# Patient Record
Sex: Female | Born: 1945
Health system: Southern US, Community
[De-identification: ages and names within clinical notes are randomized; demographics above are authoritative.]

## PROBLEM LIST (undated history)

## (undated) DIAGNOSIS — M797 Fibromyalgia: Secondary | ICD-10-CM

## (undated) DIAGNOSIS — Z8701 Personal history of pneumonia (recurrent): Secondary | ICD-10-CM

## (undated) DIAGNOSIS — Z9689 Presence of other specified functional implants: Secondary | ICD-10-CM

## (undated) DIAGNOSIS — M810 Age-related osteoporosis without current pathological fracture: Secondary | ICD-10-CM

## (undated) DIAGNOSIS — F419 Anxiety disorder, unspecified: Secondary | ICD-10-CM

## (undated) DIAGNOSIS — R0609 Other forms of dyspnea: Secondary | ICD-10-CM

## (undated) DIAGNOSIS — Z9181 History of falling: Secondary | ICD-10-CM

## (undated) DIAGNOSIS — G250 Essential tremor: Secondary | ICD-10-CM

## (undated) DIAGNOSIS — S471XXA Crushing injury of right shoulder and upper arm, initial encounter: Secondary | ICD-10-CM

## (undated) DIAGNOSIS — M4120 Other idiopathic scoliosis, site unspecified: Secondary | ICD-10-CM

## (undated) DIAGNOSIS — Z8619 Personal history of other infectious and parasitic diseases: Secondary | ICD-10-CM

## (undated) DIAGNOSIS — E785 Hyperlipidemia, unspecified: Secondary | ICD-10-CM

## (undated) DIAGNOSIS — Z8659 Personal history of other mental and behavioral disorders: Secondary | ICD-10-CM

## (undated) DIAGNOSIS — M1990 Unspecified osteoarthritis, unspecified site: Secondary | ICD-10-CM

## (undated) DIAGNOSIS — D518 Other vitamin B12 deficiency anemias: Secondary | ICD-10-CM

## (undated) DIAGNOSIS — Z973 Presence of spectacles and contact lenses: Secondary | ICD-10-CM

## (undated) DIAGNOSIS — N201 Calculus of ureter: Secondary | ICD-10-CM

## (undated) DIAGNOSIS — Z9889 Other specified postprocedural states: Secondary | ICD-10-CM

## (undated) DIAGNOSIS — F988 Other specified behavioral and emotional disorders with onset usually occurring in childhood and adolescence: Secondary | ICD-10-CM

## (undated) DIAGNOSIS — Z972 Presence of dental prosthetic device (complete) (partial): Secondary | ICD-10-CM

## (undated) DIAGNOSIS — R918 Other nonspecific abnormal finding of lung field: Secondary | ICD-10-CM

## (undated) DIAGNOSIS — I10 Essential (primary) hypertension: Secondary | ICD-10-CM

## (undated) DIAGNOSIS — G43909 Migraine, unspecified, not intractable, without status migrainosus: Secondary | ICD-10-CM

## (undated) DIAGNOSIS — S6732XA Crushing injury of left wrist, initial encounter: Secondary | ICD-10-CM

## (undated) DIAGNOSIS — S90811A Abrasion, right foot, initial encounter: Secondary | ICD-10-CM

## (undated) DIAGNOSIS — D649 Anemia, unspecified: Secondary | ICD-10-CM

## (undated) DIAGNOSIS — N289 Disorder of kidney and ureter, unspecified: Secondary | ICD-10-CM

## (undated) DIAGNOSIS — G8929 Other chronic pain: Secondary | ICD-10-CM

## (undated) DIAGNOSIS — R112 Nausea with vomiting, unspecified: Secondary | ICD-10-CM

## (undated) DIAGNOSIS — R0600 Dyspnea, unspecified: Secondary | ICD-10-CM

## (undated) DIAGNOSIS — M549 Dorsalgia, unspecified: Secondary | ICD-10-CM

## (undated) DIAGNOSIS — Z9884 Bariatric surgery status: Secondary | ICD-10-CM

## (undated) DIAGNOSIS — Z87898 Personal history of other specified conditions: Secondary | ICD-10-CM

## (undated) HISTORY — PX: CARDIAC CATHETERIZATION: SHX172

## (undated) HISTORY — PX: TUBAL LIGATION: SHX77

## (undated) HISTORY — DX: Other vitamin B12 deficiency anemias: D51.8

## (undated) HISTORY — DX: Other specified behavioral and emotional disorders with onset usually occurring in childhood and adolescence: F98.8

## (undated) HISTORY — DX: Other idiopathic scoliosis, site unspecified: M41.20

## (undated) HISTORY — PX: ROUX-EN-Y GASTRIC BYPASS: SHX1104

## (undated) HISTORY — PX: KNEE ARTHROSCOPY: SHX127

## (undated) HISTORY — DX: Anxiety disorder, unspecified: F41.9

## (undated) HISTORY — PX: ABDOMINAL HYSTERECTOMY: SHX81

## (undated) HISTORY — PX: OTHER SURGICAL HISTORY: SHX169

---
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
## 1999-11-09 HISTORY — PX: REDUCTION MAMMAPLASTY: SUR839

## 2000-11-08 DIAGNOSIS — Z87898 Personal history of other specified conditions: Secondary | ICD-10-CM

## 2000-11-08 HISTORY — DX: Personal history of other specified conditions: Z87.898

## 2001-03-31 ENCOUNTER — Ambulatory Visit (HOSPITAL_COMMUNITY): Admission: RE | Admit: 2001-03-31 | Discharge: 2001-03-31 | Payer: Self-pay | Admitting: Orthopedic Surgery

## 2002-12-12 ENCOUNTER — Inpatient Hospital Stay (HOSPITAL_COMMUNITY): Admission: EM | Admit: 2002-12-12 | Discharge: 2002-12-13 | Payer: Self-pay | Admitting: Emergency Medicine

## 2002-12-12 ENCOUNTER — Encounter: Payer: Self-pay | Admitting: Emergency Medicine

## 2003-02-18 ENCOUNTER — Encounter: Admission: RE | Admit: 2003-02-18 | Discharge: 2003-02-18 | Payer: Self-pay | Admitting: Family Medicine

## 2003-02-18 ENCOUNTER — Encounter: Payer: Self-pay | Admitting: Family Medicine

## 2003-06-12 ENCOUNTER — Encounter: Admission: RE | Admit: 2003-06-12 | Discharge: 2003-09-10 | Payer: Self-pay | Admitting: Surgery

## 2003-06-17 ENCOUNTER — Encounter: Payer: Self-pay | Admitting: Surgery

## 2003-06-17 ENCOUNTER — Encounter: Admission: RE | Admit: 2003-06-17 | Discharge: 2003-06-17 | Payer: Self-pay | Admitting: Surgery

## 2003-09-16 ENCOUNTER — Inpatient Hospital Stay (HOSPITAL_COMMUNITY): Admission: RE | Admit: 2003-09-16 | Discharge: 2003-09-18 | Payer: Self-pay | Admitting: Surgery

## 2003-09-16 DIAGNOSIS — Z9884 Bariatric surgery status: Secondary | ICD-10-CM

## 2003-09-16 HISTORY — DX: Bariatric surgery status: Z98.84

## 2003-10-08 ENCOUNTER — Encounter: Admission: RE | Admit: 2003-10-08 | Discharge: 2004-01-06 | Payer: Self-pay | Admitting: Surgery

## 2004-07-31 ENCOUNTER — Encounter: Admission: RE | Admit: 2004-07-31 | Discharge: 2004-07-31 | Payer: Self-pay | Admitting: Family Medicine

## 2004-08-03 ENCOUNTER — Encounter: Admission: RE | Admit: 2004-08-03 | Discharge: 2004-08-03 | Payer: Self-pay | Admitting: Family Medicine

## 2004-12-07 ENCOUNTER — Ambulatory Visit: Payer: Self-pay | Admitting: Family Medicine

## 2005-02-17 ENCOUNTER — Ambulatory Visit: Payer: Self-pay | Admitting: Family Medicine

## 2005-02-27 ENCOUNTER — Encounter: Admission: RE | Admit: 2005-02-27 | Discharge: 2005-02-27 | Payer: Self-pay | Admitting: Family Medicine

## 2005-03-16 ENCOUNTER — Ambulatory Visit: Payer: Self-pay | Admitting: Family Medicine

## 2005-04-23 ENCOUNTER — Ambulatory Visit: Payer: Self-pay | Admitting: Family Medicine

## 2005-12-29 ENCOUNTER — Encounter: Admission: RE | Admit: 2005-12-29 | Discharge: 2005-12-29 | Payer: Self-pay | Admitting: Family Medicine

## 2006-07-22 ENCOUNTER — Ambulatory Visit: Payer: Self-pay | Admitting: Family Medicine

## 2006-09-02 ENCOUNTER — Encounter: Admission: RE | Admit: 2006-09-02 | Discharge: 2006-09-02 | Payer: Self-pay | Admitting: Family Medicine

## 2007-05-08 ENCOUNTER — Ambulatory Visit: Payer: Self-pay | Admitting: Family Medicine

## 2007-05-15 ENCOUNTER — Other Ambulatory Visit: Admission: RE | Admit: 2007-05-15 | Discharge: 2007-05-15 | Payer: Self-pay | Admitting: Family Medicine

## 2007-05-15 ENCOUNTER — Encounter: Payer: Self-pay | Admitting: Family Medicine

## 2007-05-15 ENCOUNTER — Ambulatory Visit: Payer: Self-pay | Admitting: Family Medicine

## 2007-05-15 DIAGNOSIS — R1013 Epigastric pain: Secondary | ICD-10-CM

## 2007-05-15 DIAGNOSIS — E785 Hyperlipidemia, unspecified: Secondary | ICD-10-CM | POA: Insufficient documentation

## 2007-05-15 DIAGNOSIS — IMO0001 Reserved for inherently not codable concepts without codable children: Secondary | ICD-10-CM | POA: Insufficient documentation

## 2007-05-15 DIAGNOSIS — R51 Headache: Secondary | ICD-10-CM

## 2007-05-15 DIAGNOSIS — R0789 Other chest pain: Secondary | ICD-10-CM

## 2007-05-15 DIAGNOSIS — E8941 Symptomatic postprocedural ovarian failure: Secondary | ICD-10-CM

## 2007-05-15 DIAGNOSIS — R519 Headache, unspecified: Secondary | ICD-10-CM | POA: Insufficient documentation

## 2007-05-15 DIAGNOSIS — Z9884 Bariatric surgery status: Secondary | ICD-10-CM

## 2007-05-15 DIAGNOSIS — Z9189 Other specified personal risk factors, not elsewhere classified: Secondary | ICD-10-CM | POA: Insufficient documentation

## 2007-05-15 LAB — CONVERTED CEMR LAB
ALT: 12 units/L (ref 0–35)
AST: 29 units/L (ref 0–37)
Albumin: 3.7 g/dL (ref 3.5–5.2)
Alkaline Phosphatase: 83 units/L (ref 39–117)
BUN: 13 mg/dL (ref 6–23)
Basophils Absolute: 0.1 10*3/uL (ref 0.0–0.1)
Basophils Relative: 0.9 % (ref 0.0–1.0)
Bilirubin, Direct: 0.1 mg/dL (ref 0.0–0.3)
CO2: 25 meq/L (ref 19–32)
Calcium: 8.4 mg/dL (ref 8.4–10.5)
Chloride: 114 meq/L — ABNORMAL HIGH (ref 96–112)
Cholesterol: 212 mg/dL (ref 0–200)
Creatinine, Ser: 0.6 mg/dL (ref 0.4–1.2)
Direct LDL: 143.3 mg/dL
Eosinophils Absolute: 0.4 10*3/uL (ref 0.0–0.6)
Eosinophils Relative: 7.5 % — ABNORMAL HIGH (ref 0.0–5.0)
GFR calc Af Amer: 132 mL/min
GFR calc non Af Amer: 109 mL/min
Glucose, Bld: 69 mg/dL — ABNORMAL LOW (ref 70–99)
HCT: 37.2 % (ref 36.0–46.0)
HDL: 64.4 mg/dL (ref >39.0)
Hemoglobin: 12.1 g/dL (ref 12.0–15.0)
Lymphocytes Relative: 36.6 % (ref 12.0–46.0)
MCHC: 32.5 g/dL (ref 30.0–36.0)
MCV: 85.5 fL (ref 78.0–100.0)
Monocytes Absolute: 0.6 10*3/uL (ref 0.2–0.7)
Monocytes Relative: 10 % (ref 3.0–11.0)
Neutro Abs: 2.5 10*3/uL (ref 1.4–7.7)
Neutrophils Relative %: 45 % (ref 43.0–77.0)
Platelets: 350 10*3/uL (ref 150–400)
Potassium: 4 meq/L (ref 3.5–5.1)
RBC: 4.35 M/uL (ref 3.87–5.11)
RDW: 14 % (ref 11.5–14.6)
Sodium: 143 meq/L (ref 135–145)
TSH: 2.38 microintl units/mL (ref 0.35–5.50)
Total Bilirubin: 0.6 mg/dL (ref 0.3–1.2)
Total CHOL/HDL Ratio: 3.3
Total Protein: 6.7 g/dL (ref 6.0–8.3)
Triglycerides: 117 mg/dL (ref 0–149)
VLDL: 23 mg/dL (ref 0–40)
WBC: 5.7 10*3/uL (ref 4.5–10.5)

## 2007-05-16 ENCOUNTER — Encounter: Payer: Self-pay | Admitting: Family Medicine

## 2007-05-18 ENCOUNTER — Encounter (INDEPENDENT_AMBULATORY_CARE_PROVIDER_SITE_OTHER): Payer: Self-pay | Admitting: *Deleted

## 2007-05-18 ENCOUNTER — Telehealth (INDEPENDENT_AMBULATORY_CARE_PROVIDER_SITE_OTHER): Payer: Self-pay | Admitting: *Deleted

## 2007-05-22 LAB — CONVERTED CEMR LAB: Anti Nuclear Antibody(ANA): NEGATIVE

## 2007-05-29 ENCOUNTER — Ambulatory Visit: Payer: Self-pay

## 2007-05-29 ENCOUNTER — Encounter: Payer: Self-pay | Admitting: Family Medicine

## 2007-06-01 ENCOUNTER — Encounter: Payer: Self-pay | Admitting: Family Medicine

## 2007-06-06 ENCOUNTER — Ambulatory Visit: Payer: Self-pay

## 2007-06-08 ENCOUNTER — Encounter: Payer: Self-pay | Admitting: Family Medicine

## 2007-06-08 ENCOUNTER — Encounter: Admission: RE | Admit: 2007-06-08 | Discharge: 2007-06-08 | Payer: Self-pay | Admitting: Family Medicine

## 2007-06-09 ENCOUNTER — Encounter (INDEPENDENT_AMBULATORY_CARE_PROVIDER_SITE_OTHER): Payer: Self-pay | Admitting: *Deleted

## 2007-06-09 ENCOUNTER — Encounter: Payer: Self-pay | Admitting: Family Medicine

## 2007-06-12 ENCOUNTER — Telehealth (INDEPENDENT_AMBULATORY_CARE_PROVIDER_SITE_OTHER): Payer: Self-pay | Admitting: *Deleted

## 2007-06-13 ENCOUNTER — Telehealth (INDEPENDENT_AMBULATORY_CARE_PROVIDER_SITE_OTHER): Payer: Self-pay | Admitting: *Deleted

## 2007-06-13 ENCOUNTER — Encounter (INDEPENDENT_AMBULATORY_CARE_PROVIDER_SITE_OTHER): Payer: Self-pay | Admitting: *Deleted

## 2007-07-06 ENCOUNTER — Ambulatory Visit: Payer: Self-pay | Admitting: Cardiology

## 2007-07-17 ENCOUNTER — Ambulatory Visit: Payer: Self-pay

## 2007-07-17 ENCOUNTER — Encounter: Payer: Self-pay | Admitting: Cardiology

## 2007-07-17 ENCOUNTER — Ambulatory Visit: Payer: Self-pay | Admitting: Cardiology

## 2007-07-17 LAB — CONVERTED CEMR LAB
ALT: 12 units/L (ref 0–35)
AST: 23 units/L (ref 0–37)
Albumin: 3.7 g/dL (ref 3.5–5.2)
Alkaline Phosphatase: 95 units/L (ref 39–117)
BUN: 11 mg/dL (ref 6–23)
Basophils Absolute: 0 10*3/uL (ref 0.0–0.1)
Basophils Relative: 0.4 % (ref 0.0–1.0)
Bilirubin, Direct: 0.1 mg/dL (ref 0.0–0.3)
CO2: 32 meq/L (ref 19–32)
Calcium: 9 mg/dL (ref 8.4–10.5)
Chloride: 109 meq/L (ref 96–112)
Cholesterol: 203 mg/dL (ref 0–200)
Creatinine, Ser: 0.8 mg/dL (ref 0.4–1.2)
Direct LDL: 129.1 mg/dL
Eosinophils Absolute: 0.3 10*3/uL (ref 0.0–0.6)
Eosinophils Relative: 6.1 % — ABNORMAL HIGH (ref 0.0–5.0)
GFR calc Af Amer: 94 mL/min
GFR calc non Af Amer: 78 mL/min
Glucose, Bld: 88 mg/dL (ref 70–99)
HCT: 34.8 % — ABNORMAL LOW (ref 36.0–46.0)
HDL: 62.2 mg/dL (ref >39.0)
Hemoglobin: 11.4 g/dL — ABNORMAL LOW (ref 12.0–15.0)
Lymphocytes Relative: 37 % (ref 12.0–46.0)
MCHC: 32.8 g/dL (ref 30.0–36.0)
MCV: 83 fL (ref 78.0–100.0)
Monocytes Absolute: 0.5 10*3/uL (ref 0.2–0.7)
Monocytes Relative: 10.1 % (ref 3.0–11.0)
Neutro Abs: 2.5 10*3/uL (ref 1.4–7.7)
Neutrophils Relative %: 46.4 % (ref 43.0–77.0)
Platelets: 363 10*3/uL (ref 150–400)
Potassium: 4.4 meq/L (ref 3.5–5.1)
RBC: 4.19 M/uL (ref 3.87–5.11)
RDW: 14.2 % (ref 11.5–14.6)
Sodium: 144 meq/L (ref 135–145)
TSH: 1.42 microintl units/mL (ref 0.35–5.50)
Total Bilirubin: 0.7 mg/dL (ref 0.3–1.2)
Total CHOL/HDL Ratio: 3.3
Total Protein: 7 g/dL (ref 6.0–8.3)
Triglycerides: 129 mg/dL (ref 0–149)
VLDL: 26 mg/dL (ref 0–40)
WBC: 5.3 10*3/uL (ref 4.5–10.5)

## 2007-08-18 ENCOUNTER — Telehealth (INDEPENDENT_AMBULATORY_CARE_PROVIDER_SITE_OTHER): Payer: Self-pay | Admitting: *Deleted

## 2007-08-21 ENCOUNTER — Ambulatory Visit: Payer: Self-pay | Admitting: Family Medicine

## 2007-08-25 ENCOUNTER — Encounter (INDEPENDENT_AMBULATORY_CARE_PROVIDER_SITE_OTHER): Payer: Self-pay | Admitting: *Deleted

## 2007-08-25 LAB — CONVERTED CEMR LAB
ALT: 12 units/L (ref 0–35)
AST: 25 units/L (ref 0–37)
Albumin: 3.6 g/dL (ref 3.5–5.2)
Alkaline Phosphatase: 91 units/L (ref 39–117)
Bilirubin, Direct: 0.1 mg/dL (ref 0.0–0.3)
Cholesterol: 172 mg/dL (ref 0–200)
HDL: 49.1 mg/dL (ref >39.0)
LDL Cholesterol: 84 mg/dL (ref 0–99)
Total Bilirubin: 0.7 mg/dL (ref 0.3–1.2)
Total CHOL/HDL Ratio: 3.5
Total Protein: 6.7 g/dL (ref 6.0–8.3)
Triglycerides: 194 mg/dL — ABNORMAL HIGH (ref 0–149)
VLDL: 39 mg/dL (ref 0–40)

## 2007-10-27 ENCOUNTER — Telehealth (INDEPENDENT_AMBULATORY_CARE_PROVIDER_SITE_OTHER): Payer: Self-pay | Admitting: *Deleted

## 2007-11-22 ENCOUNTER — Telehealth (INDEPENDENT_AMBULATORY_CARE_PROVIDER_SITE_OTHER): Payer: Self-pay | Admitting: *Deleted

## 2007-11-29 ENCOUNTER — Telehealth (INDEPENDENT_AMBULATORY_CARE_PROVIDER_SITE_OTHER): Payer: Self-pay | Admitting: *Deleted

## 2007-12-01 ENCOUNTER — Telehealth (INDEPENDENT_AMBULATORY_CARE_PROVIDER_SITE_OTHER): Payer: Self-pay | Admitting: *Deleted

## 2007-12-09 ENCOUNTER — Encounter: Payer: Self-pay | Admitting: Family Medicine

## 2007-12-28 ENCOUNTER — Ambulatory Visit: Payer: Self-pay | Admitting: Family Medicine

## 2007-12-28 DIAGNOSIS — J209 Acute bronchitis, unspecified: Secondary | ICD-10-CM

## 2007-12-28 DIAGNOSIS — F5089 Other specified eating disorder: Secondary | ICD-10-CM

## 2007-12-28 DIAGNOSIS — R5381 Other malaise: Secondary | ICD-10-CM

## 2007-12-28 DIAGNOSIS — R5383 Other fatigue: Secondary | ICD-10-CM

## 2007-12-28 DIAGNOSIS — F341 Dysthymic disorder: Secondary | ICD-10-CM

## 2008-01-01 LAB — CONVERTED CEMR LAB
ALT: 9 units/L (ref 0–35)
AST: 25 units/L (ref 0–37)
Albumin: 3.7 g/dL (ref 3.5–5.2)
Alkaline Phosphatase: 114 units/L (ref 39–117)
Basophils Absolute: 0.1 10*3/uL (ref 0.0–0.1)
Basophils Relative: 1.1 % — ABNORMAL HIGH (ref 0.0–1.0)
Bilirubin, Direct: 0.1 mg/dL (ref 0.0–0.3)
Cholesterol: 171 mg/dL (ref 0–200)
Eosinophils Absolute: 0.2 10*3/uL (ref 0.0–0.6)
Eosinophils Relative: 3.9 % (ref 0.0–5.0)
Ferritin: 11.8 ng/mL (ref 10.0–291.0)
HCT: 35.7 % — ABNORMAL LOW (ref 36.0–46.0)
HDL: 67.1 mg/dL (ref >39.0)
Hemoglobin: 11.1 g/dL — ABNORMAL LOW (ref 12.0–15.0)
Iron: 27 ug/dL — ABNORMAL LOW (ref 42–145)
LDL Cholesterol: 91 mg/dL (ref 0–99)
Lymphocytes Relative: 27.4 % (ref 12.0–46.0)
MCHC: 31.1 g/dL (ref 30.0–36.0)
MCV: 74.6 fL — ABNORMAL LOW (ref 78.0–100.0)
Monocytes Absolute: 0.6 10*3/uL (ref 0.2–0.7)
Monocytes Relative: 10.7 % (ref 3.0–11.0)
Neutro Abs: 3.3 10*3/uL (ref 1.4–7.7)
Neutrophils Relative %: 56.9 % (ref 43.0–77.0)
Platelets: 331 10*3/uL (ref 150–400)
RBC: 4.78 M/uL (ref 3.87–5.11)
RDW: 22 % — ABNORMAL HIGH (ref 11.5–14.6)
Saturation Ratios: 6.3 % — ABNORMAL LOW (ref 20.0–50.0)
Total Bilirubin: 0.6 mg/dL (ref 0.3–1.2)
Total CHOL/HDL Ratio: 2.5
Total Protein: 7.3 g/dL (ref 6.0–8.3)
Transferrin: 305.4 mg/dL (ref >=212.0)
Triglycerides: 66 mg/dL (ref 0–149)
VLDL: 13 mg/dL (ref 0–40)
Vit D, 1,25-Dihydroxy: 30 (ref 30–89)
WBC: 5.8 10*3/uL (ref 4.5–10.5)

## 2008-01-24 ENCOUNTER — Telehealth: Payer: Self-pay | Admitting: Family Medicine

## 2008-02-26 ENCOUNTER — Telehealth (INDEPENDENT_AMBULATORY_CARE_PROVIDER_SITE_OTHER): Payer: Self-pay | Admitting: *Deleted

## 2008-04-18 ENCOUNTER — Telehealth: Payer: Self-pay | Admitting: Family Medicine

## 2008-04-29 ENCOUNTER — Telehealth (INDEPENDENT_AMBULATORY_CARE_PROVIDER_SITE_OTHER): Payer: Self-pay | Admitting: *Deleted

## 2008-05-01 ENCOUNTER — Encounter: Admission: RE | Admit: 2008-05-01 | Discharge: 2008-05-01 | Payer: Self-pay | Admitting: Family Medicine

## 2008-06-20 ENCOUNTER — Telehealth (INDEPENDENT_AMBULATORY_CARE_PROVIDER_SITE_OTHER): Payer: Self-pay | Admitting: *Deleted

## 2008-06-26 ENCOUNTER — Ambulatory Visit: Payer: Self-pay | Admitting: Family Medicine

## 2008-06-26 DIAGNOSIS — D649 Anemia, unspecified: Secondary | ICD-10-CM | POA: Insufficient documentation

## 2008-06-26 LAB — CONVERTED CEMR LAB
BUN: 9 mg/dL (ref 6–23)
Basophils Absolute: 0 10*3/uL (ref 0.0–0.1)
Basophils Relative: 0 % (ref 0.0–3.0)
CO2: 30 meq/L (ref 19–32)
Calcium: 9.1 mg/dL (ref 8.4–10.5)
Chloride: 106 meq/L (ref 96–112)
Creatinine, Ser: 0.8 mg/dL (ref 0.4–1.2)
Eosinophils Absolute: 0.1 10*3/uL (ref 0.0–0.7)
Eosinophils Relative: 2.5 % (ref 0.0–5.0)
Ferritin: 8.8 ng/mL — ABNORMAL LOW (ref 10.0–291.0)
Folate: 18.9 ng/mL
GFR calc Af Amer: 94 mL/min
GFR calc non Af Amer: 78 mL/min
Glucose, Bld: 85 mg/dL (ref 70–99)
HCT: 43.9 % (ref 36.0–46.0)
Hemoglobin: 15 g/dL (ref 12.0–15.0)
Iron: 45 ug/dL (ref 42–145)
Lymphocytes Relative: 35.5 % (ref 12.0–46.0)
MCHC: 34.1 g/dL (ref 30.0–36.0)
MCV: 88.6 fL (ref 78.0–100.0)
Monocytes Absolute: 0.6 10*3/uL (ref 0.1–1.0)
Monocytes Relative: 9.6 % (ref 3.0–12.0)
Neutro Abs: 3 10*3/uL (ref 1.4–7.7)
Neutrophils Relative %: 52.4 % (ref 43.0–77.0)
Platelets: 270 10*3/uL (ref 150–400)
Potassium: 3.9 meq/L (ref 3.5–5.1)
RBC: 4.96 M/uL (ref 3.87–5.11)
RDW: 14.1 % (ref 11.5–14.6)
Saturation Ratios: 10.4 % — ABNORMAL LOW (ref 20.0–50.0)
Sodium: 143 meq/L (ref 135–145)
TSH: 1.67 microintl units/mL (ref 0.35–5.50)
Transferrin: 307.9 mg/dL (ref >=212.0)
Vitamin B-12: 624 pg/mL (ref 211–911)
WBC: 5.8 10*3/uL (ref 4.5–10.5)

## 2008-07-01 ENCOUNTER — Encounter (INDEPENDENT_AMBULATORY_CARE_PROVIDER_SITE_OTHER): Payer: Self-pay | Admitting: *Deleted

## 2008-08-09 ENCOUNTER — Telehealth (INDEPENDENT_AMBULATORY_CARE_PROVIDER_SITE_OTHER): Payer: Self-pay | Admitting: *Deleted

## 2008-09-11 ENCOUNTER — Telehealth (INDEPENDENT_AMBULATORY_CARE_PROVIDER_SITE_OTHER): Payer: Self-pay | Admitting: *Deleted

## 2008-09-17 ENCOUNTER — Ambulatory Visit: Payer: Self-pay | Admitting: Family Medicine

## 2008-09-19 ENCOUNTER — Telehealth (INDEPENDENT_AMBULATORY_CARE_PROVIDER_SITE_OTHER): Payer: Self-pay | Admitting: *Deleted

## 2008-10-08 ENCOUNTER — Telehealth: Payer: Self-pay | Admitting: Family Medicine

## 2008-11-13 ENCOUNTER — Telehealth (INDEPENDENT_AMBULATORY_CARE_PROVIDER_SITE_OTHER): Payer: Self-pay | Admitting: *Deleted

## 2008-12-03 ENCOUNTER — Ambulatory Visit: Payer: Self-pay | Admitting: Family Medicine

## 2008-12-03 DIAGNOSIS — K219 Gastro-esophageal reflux disease without esophagitis: Secondary | ICD-10-CM | POA: Insufficient documentation

## 2009-01-08 ENCOUNTER — Telehealth (INDEPENDENT_AMBULATORY_CARE_PROVIDER_SITE_OTHER): Payer: Self-pay | Admitting: *Deleted

## 2009-01-10 ENCOUNTER — Telehealth: Payer: Self-pay | Admitting: Family Medicine

## 2009-01-16 ENCOUNTER — Telehealth (INDEPENDENT_AMBULATORY_CARE_PROVIDER_SITE_OTHER): Payer: Self-pay | Admitting: *Deleted

## 2009-03-03 ENCOUNTER — Telehealth (INDEPENDENT_AMBULATORY_CARE_PROVIDER_SITE_OTHER): Payer: Self-pay | Admitting: *Deleted

## 2009-03-04 ENCOUNTER — Telehealth (INDEPENDENT_AMBULATORY_CARE_PROVIDER_SITE_OTHER): Payer: Self-pay | Admitting: *Deleted

## 2009-03-05 ENCOUNTER — Telehealth (INDEPENDENT_AMBULATORY_CARE_PROVIDER_SITE_OTHER): Payer: Self-pay | Admitting: *Deleted

## 2009-03-17 ENCOUNTER — Telehealth (INDEPENDENT_AMBULATORY_CARE_PROVIDER_SITE_OTHER): Payer: Self-pay | Admitting: *Deleted

## 2009-04-02 ENCOUNTER — Telehealth (INDEPENDENT_AMBULATORY_CARE_PROVIDER_SITE_OTHER): Payer: Self-pay | Admitting: *Deleted

## 2009-04-04 ENCOUNTER — Telehealth (INDEPENDENT_AMBULATORY_CARE_PROVIDER_SITE_OTHER): Payer: Self-pay | Admitting: *Deleted

## 2009-04-30 ENCOUNTER — Telehealth (INDEPENDENT_AMBULATORY_CARE_PROVIDER_SITE_OTHER): Payer: Self-pay | Admitting: *Deleted

## 2009-05-29 ENCOUNTER — Telehealth (INDEPENDENT_AMBULATORY_CARE_PROVIDER_SITE_OTHER): Payer: Self-pay | Admitting: *Deleted

## 2009-05-30 ENCOUNTER — Ambulatory Visit: Payer: Self-pay | Admitting: Family Medicine

## 2009-08-18 ENCOUNTER — Telehealth (INDEPENDENT_AMBULATORY_CARE_PROVIDER_SITE_OTHER): Payer: Self-pay | Admitting: *Deleted

## 2009-08-21 ENCOUNTER — Telehealth (INDEPENDENT_AMBULATORY_CARE_PROVIDER_SITE_OTHER): Payer: Self-pay | Admitting: *Deleted

## 2009-09-19 ENCOUNTER — Ambulatory Visit: Payer: Self-pay | Admitting: Family Medicine

## 2009-09-22 ENCOUNTER — Telehealth: Payer: Self-pay | Admitting: Family Medicine

## 2009-09-24 ENCOUNTER — Telehealth: Payer: Self-pay | Admitting: Family Medicine

## 2009-10-27 ENCOUNTER — Telehealth: Payer: Self-pay | Admitting: Family Medicine

## 2009-10-28 ENCOUNTER — Ambulatory Visit: Payer: Self-pay | Admitting: Family Medicine

## 2009-10-28 DIAGNOSIS — R413 Other amnesia: Secondary | ICD-10-CM

## 2009-10-28 DIAGNOSIS — N393 Stress incontinence (female) (male): Secondary | ICD-10-CM | POA: Insufficient documentation

## 2009-10-30 ENCOUNTER — Ambulatory Visit: Payer: Self-pay | Admitting: Family Medicine

## 2009-10-30 DIAGNOSIS — D518 Other vitamin B12 deficiency anemias: Secondary | ICD-10-CM | POA: Insufficient documentation

## 2009-10-30 HISTORY — DX: Other vitamin B12 deficiency anemias: D51.8

## 2009-11-06 ENCOUNTER — Ambulatory Visit: Payer: Self-pay | Admitting: Family Medicine

## 2009-11-13 ENCOUNTER — Ambulatory Visit: Payer: Self-pay | Admitting: Family Medicine

## 2009-11-21 ENCOUNTER — Ambulatory Visit: Payer: Self-pay | Admitting: Family Medicine

## 2009-11-26 ENCOUNTER — Telehealth: Payer: Self-pay | Admitting: Family Medicine

## 2009-12-12 ENCOUNTER — Ambulatory Visit: Payer: Self-pay | Admitting: Family Medicine

## 2009-12-12 DIAGNOSIS — G252 Other specified forms of tremor: Secondary | ICD-10-CM

## 2009-12-12 DIAGNOSIS — R002 Palpitations: Secondary | ICD-10-CM

## 2009-12-12 DIAGNOSIS — G250 Essential tremor: Secondary | ICD-10-CM | POA: Insufficient documentation

## 2009-12-22 ENCOUNTER — Ambulatory Visit: Payer: Self-pay | Admitting: Family Medicine

## 2009-12-22 ENCOUNTER — Encounter (INDEPENDENT_AMBULATORY_CARE_PROVIDER_SITE_OTHER): Payer: Self-pay | Admitting: *Deleted

## 2009-12-29 ENCOUNTER — Ambulatory Visit: Payer: Self-pay | Admitting: Family Medicine

## 2009-12-31 DIAGNOSIS — R42 Dizziness and giddiness: Secondary | ICD-10-CM

## 2010-01-05 ENCOUNTER — Telehealth: Payer: Self-pay | Admitting: Family Medicine

## 2010-01-12 ENCOUNTER — Telehealth: Payer: Self-pay | Admitting: Family Medicine

## 2010-01-21 ENCOUNTER — Encounter: Payer: Self-pay | Admitting: Family Medicine

## 2010-02-05 ENCOUNTER — Telehealth: Payer: Self-pay | Admitting: Family Medicine

## 2010-03-02 ENCOUNTER — Encounter: Payer: Self-pay | Admitting: Family Medicine

## 2010-03-09 ENCOUNTER — Telehealth: Payer: Self-pay | Admitting: Family Medicine

## 2010-03-20 ENCOUNTER — Telehealth (INDEPENDENT_AMBULATORY_CARE_PROVIDER_SITE_OTHER): Payer: Self-pay | Admitting: *Deleted

## 2010-04-10 ENCOUNTER — Telehealth: Payer: Self-pay | Admitting: Family Medicine

## 2010-04-16 ENCOUNTER — Telehealth: Payer: Self-pay | Admitting: Family Medicine

## 2010-05-19 ENCOUNTER — Ambulatory Visit: Payer: Self-pay | Admitting: Family Medicine

## 2010-06-17 ENCOUNTER — Encounter: Payer: Self-pay | Admitting: Family Medicine

## 2010-06-25 ENCOUNTER — Telehealth: Payer: Self-pay | Admitting: Family Medicine

## 2010-07-16 ENCOUNTER — Telehealth: Payer: Self-pay | Admitting: Family Medicine

## 2010-07-16 ENCOUNTER — Telehealth (INDEPENDENT_AMBULATORY_CARE_PROVIDER_SITE_OTHER): Payer: Self-pay | Admitting: *Deleted

## 2010-08-11 ENCOUNTER — Telehealth: Payer: Self-pay | Admitting: Family Medicine

## 2010-08-19 ENCOUNTER — Telehealth: Payer: Self-pay | Admitting: Family Medicine

## 2010-09-03 ENCOUNTER — Encounter: Payer: Self-pay | Admitting: Family Medicine

## 2010-09-03 ENCOUNTER — Telehealth: Payer: Self-pay | Admitting: Family Medicine

## 2010-09-18 ENCOUNTER — Telehealth: Payer: Self-pay | Admitting: Family Medicine

## 2010-09-30 ENCOUNTER — Telehealth: Payer: Self-pay | Admitting: Family Medicine

## 2010-10-26 ENCOUNTER — Telehealth: Payer: Self-pay | Admitting: Family Medicine

## 2010-11-04 ENCOUNTER — Telehealth: Payer: Self-pay | Admitting: Family Medicine

## 2010-11-19 ENCOUNTER — Telehealth: Payer: Self-pay | Admitting: Family Medicine

## 2010-11-27 ENCOUNTER — Telehealth (INDEPENDENT_AMBULATORY_CARE_PROVIDER_SITE_OTHER): Payer: Self-pay | Admitting: *Deleted

## 2010-11-28 ENCOUNTER — Encounter: Payer: Self-pay | Admitting: Surgery

## 2010-12-06 LAB — CONVERTED CEMR LAB
ALT: 11 units/L (ref 0–35)
AST: 22 units/L (ref 0–37)
Albumin: 3.8 g/dL (ref 3.5–5.2)
Alkaline Phosphatase: 83 units/L (ref 39–117)
BUN: 5 mg/dL — ABNORMAL LOW (ref 6–23)
Basophils Absolute: 0.1 10*3/uL (ref 0.0–0.1)
Basophils Relative: 1 % (ref 0.0–3.0)
Bilirubin Urine: NEGATIVE
Bilirubin, Direct: 0 mg/dL (ref 0.0–0.3)
Blood in Urine, dipstick: NEGATIVE
CO2: 30 meq/L (ref 19–32)
Calcium: 9.1 mg/dL (ref 8.4–10.5)
Chloride: 103 meq/L (ref 96–112)
Cholesterol: 182 mg/dL (ref 0–200)
Creatinine, Ser: 0.9 mg/dL (ref 0.4–1.2)
Eosinophils Absolute: 0.3 10*3/uL (ref 0.0–0.7)
Eosinophils Relative: 4 % (ref 0.0–5.0)
Folate: 11 ng/mL
GFR calc non Af Amer: 67.49 mL/min (ref >60)
Glucose, Bld: 76 mg/dL (ref 70–99)
Glucose, Urine, Semiquant: NEGATIVE
HCT: 43.5 % (ref 36.0–46.0)
HDL: 61.3 mg/dL (ref >39.00)
Hemoglobin: 14.4 g/dL (ref 12.0–15.0)
Ketones, urine, test strip: NEGATIVE
LDL Cholesterol: 90 mg/dL (ref 0–99)
Lymphocytes Relative: 34.2 % (ref 12.0–46.0)
Lymphs Abs: 2.6 10*3/uL (ref 0.7–4.0)
MCHC: 33 g/dL (ref 30.0–36.0)
MCV: 88.4 fL (ref 78.0–100.0)
Monocytes Absolute: 0.7 10*3/uL (ref 0.1–1.0)
Monocytes Relative: 9.3 % (ref 3.0–12.0)
Neutro Abs: 4 10*3/uL (ref 1.4–7.7)
Neutrophils Relative %: 51.5 % (ref 43.0–77.0)
Nitrite: NEGATIVE
Platelets: 254 10*3/uL (ref 150.0–400.0)
Potassium: 3.7 meq/L (ref 3.5–5.1)
Protein, U semiquant: NEGATIVE
RBC: 4.92 M/uL (ref 3.87–5.11)
RDW: 14.4 % (ref 11.5–14.6)
Sed Rate: 28 mm/hr — ABNORMAL HIGH (ref 0–25)
Sodium: 142 meq/L (ref 135–145)
Specific Gravity, Urine: 1.025
TSH: 2.18 microintl units/mL (ref 0.35–5.50)
Total Bilirubin: 0.9 mg/dL (ref 0.3–1.2)
Total CHOL/HDL Ratio: 3
Total CK: 75 units/L (ref 7–177)
Total Protein: 7.5 g/dL (ref 6.0–8.3)
Triglycerides: 155 mg/dL — ABNORMAL HIGH (ref 0.0–149.0)
Urobilinogen, UA: NEGATIVE
VLDL: 31 mg/dL (ref 0.0–40.0)
Vitamin B-12: 247 pg/mL (ref 211–911)
WBC Urine, dipstick: NEGATIVE
WBC: 7.7 10*3/uL (ref 4.5–10.5)
pH: 5

## 2010-12-08 NOTE — Assessment & Plan Note (Signed)
Summary: B12 SHOT/KDC  Nurse Visit   Allergies: No Known Drug Allergies  Medication Administration  Injection # 1:    Medication: Vit B12 1000 mcg    Diagnosis: OTHER VITAMIN B12 DEFICIENCY ANEMIA (ICD-281.1)    Route: IM    Site: L deltoid    Exp Date: 08/09/2011    Lot #: [id number]    Mfr: American Regent    Patient tolerated injection without complications    Given by: Jeremy Johann CMA (December 22, 2009 10:04 AM)  Orders Added: 1)  Vit B12 1000 mcg [J3420] 2)  Admin of Therapeutic Inj  intramuscular or subcutaneous [78469]

## 2010-12-08 NOTE — Letter (Signed)
Summary: Primary Care Consult Scheduled Letter  [facility]  [address] [place], [place] [address]   Phone: [phone]  Fax: [phone]      12/22/2009 MRN: [id number]  Berlynn Fairburn [address] [place], [place]  [address]    Dear Ms. Wamboldt,    We have scheduled an appointment for you.  At the recommendation of Dr. Loreen Freud, we have scheduled you a consult with Dr. Terrace Arabia with [facility] on 01-21-2010 at 11:45am.  Their address is [address], [address], [place] [address]. The office phone number is [phone].  If this appointment day and time is not convenient for you, please feel free to call the office of the doctor you are being referred to at the number listed above and reschedule the appointment.    It is important for you to keep your scheduled appointments. We are here to make sure you are given good patient care.   Thank you,    Renee, Patient Care Coordinator [facility]

## 2010-12-08 NOTE — Progress Notes (Signed)
Summary:  Lorazepam refill  Phone Note Refill Request Message from:  Fax from Pharmacy on August 12, 2010 9:12 AM  Refills Requested: Medication #1:  LORAZEPAM 0.5 MG  TABS 1 by mouth three times a day as needed [facility] fax [phone]   Initial call taken by: Okey Regal Spring,  August 12, 2010 9:12 AM  Follow-up for Phone Call        last OV 05/19/10 last filed 06/25/10. Please advise Follow-up by: Almeta Monas CMA Duncan Dull),  August 12, 2010 3:01 PM  Additional Follow-up for Phone Call Additional follow up Details #1::        refill x1  1 refill Additional Follow-up by: Loreen Freud DO,  August 12, 2010 4:57 PM    Prescriptions: LORAZEPAM 0.5 MG  TABS (LORAZEPAM) 1 by mouth three times a day as needed  #90 x 1   Entered by:   Lucious Groves CMA   Authorized by:   Loreen Freud DO   Signed by:   Lucious Groves CMA on 08/13/2010   Method used:   Telephoned to ...       [facility]* (retail)       [address]       [place], [place]  [address]       Ph: [phone]       Fax: [phone]   RxID:   [id number]

## 2010-12-08 NOTE — Progress Notes (Signed)
Summary: refill  Phone Note Refill Request Message from:  Fax from Pharmacy on [facility] fax [phone]  Refills Requested: Medication #1:  LORAZEPAM 0.5 MG  TABS 1 by mouth three times a day as needed   Last Refilled: 10/27/2009 Initial call taken by: Barb Merino,  November 26, 2009 10:40 AM  Follow-up for Phone Call        last ov- 10/28/09. Army Fossa CMA  November 26, 2009 10:57 AM    Additional Follow-up for Phone Call Additional follow up Details #2::    ok to refill x1 Follow-up by: Loreen Freud DO,  November 26, 2009 11:28 AM  Prescriptions: LORAZEPAM 0.5 MG  TABS (LORAZEPAM) 1 by mouth three times a day as needed  #90 x 0   Entered by:   Army Fossa CMA   Authorized by:   Loreen Freud DO   Signed by:   Army Fossa CMA on 11/26/2009   Method used:   Printed then faxed to ...       [facility] [address]* (retail)       [address]       [place], [place]  [address]       Ph: [phone] or [phone]       Fax: [phone]   RxID:   [id number]

## 2010-12-08 NOTE — Op Note (Signed)
Summary: Shoulder In[facility]  [facility]   Imported By: Lanelle Bal 06/26/2010 12:12:53  _____________________________________________________________________  External Attachment:    Type:   Image     Comment:   External Document

## 2010-12-08 NOTE — Progress Notes (Signed)
Summary: Refill Request  Phone Note Refill Request Message from:  Pharmacy on [facility] Fax #: [phone]  Refills Requested: Medication #1:  LORAZEPAM 0.5 MG  TABS 1 by mouth three times a day as needed   Dosage confirmed as above?Dosage Confirmed   Supply Requested: 1 month   Last Refilled: 01/06/2010 Next Appointment Scheduled: none Initial call taken by: Harold Barban,  February 05, 2010 10:07 AM  Follow-up for Phone Call        last ov- 12/12/09. Army Fossa CMA  February 05, 2010 10:09 AM   Additional Follow-up for Phone Call Additional follow up Details #1::        ok to refill --x1 Additional Follow-up by: Loreen Freud DO,  February 05, 2010 11:05 AM    Prescriptions: LORAZEPAM 0.5 MG  TABS (LORAZEPAM) 1 by mouth three times a day as needed  #90 x 0   Entered by:   Shonna Chock   Authorized by:   Loreen Freud DO   Signed by:   Shonna Chock on 02/05/2010   Method used:   Printed then faxed to ...       [facility]* (retail)       [address]       [place], [place]  [address]       Ph: [phone]       Fax: [phone]   RxID:   [id number]

## 2010-12-08 NOTE — Progress Notes (Signed)
  Phone Note Refill Request   Refills Requested: Medication #1:  EFFEXOR XR 150 MG  CP24 take two capsules daily    Prescriptions: EFFEXOR XR 150 MG  CP24 (VENLAFAXINE HCL) take two capsules daily Brand medically necessary #60 Capsule x 5   Entered by:   Jeremy Johann CMA   Authorized by:   Loreen Freud DO   Signed by:   Jeremy Johann CMA on 07/16/2010   Method used:   Reprint   RxID:   [id number]   Appended Document:  Prescriptions: VENLAFAXINE HCL 150 MG XR24H-TAB (VENLAFAXINE HCL) Take 1 tab two capsules daily  #60 x 5   Entered by:   Jeremy Johann CMA   Authorized by:   Loreen Freud DO   Signed by:   Jeremy Johann CMA on 07/17/2010   Method used:   Re-Faxed to ...       [facility]* (retail)       [address]       [place], [place]  [address]       Ph: [phone]       Fax: [phone]   RxID:   [id number]

## 2010-12-08 NOTE — Progress Notes (Signed)
Summary: CYCLOBENZAPRINE REFILL  Phone Note Refill Request Message from:  Fax from Pharmacy on September 03, 2010 2:09 PM  Refills Requested: Medication #1:  CYCLOBENZAPRINE HCL 10 MG  TABS take 1 by mouth two times a day as needed [facility],  [address], [place]    FAX [phone]  Initial call taken by: Jerolyn Shin,  September 03, 2010 2:12 PM  Follow-up for Phone Call        Last seen and filled 05/19/10. Please advise Follow-up by: Almeta Monas CMA Duncan Dull),  September 04, 2010 2:39 PM  Additional Follow-up for Phone Call Additional follow up Details #1::        refill x1 Additional Follow-up by: Loreen Freud DO,  September 04, 2010 5:18 PM    Prescriptions: CYCLOBENZAPRINE HCL 10 MG  TABS (CYCLOBENZAPRINE HCL) take 1 by mouth two times a day as needed  #60 Tablet x 0   Entered and Authorized by:   Loreen Freud DO   Signed by:   Loreen Freud DO on 09/04/2010   Method used:   Electronically to        [facility]* (retail)       [address]       [place], [place]  [address]       Ph: [phone]       Fax: [phone]   RxID:   [id number]

## 2010-12-08 NOTE — Progress Notes (Signed)
Summary: refill  Phone Note Refill Request Message from:  Fax from Pharmacy on January 05, 2010 1:19 PM  Refills Requested: Medication #1:  LORAZEPAM 0.5 MG  TABS 1 by mouth three times a day as needed   Last Refilled: 11/26/2009  Medication #2:  ABILIFY 5 MG TABS 1 by mouth once daily [facility] fax [phone]   Method Requested: Fax to Local Pharmacy Next Appointment Scheduled: 01/05/2010 Initial call taken by: Barb Merino,  January 05, 2010 1:20 PM  Follow-up for Phone Call        last ov- 12/12/09 Army Fossa CMA  January 05, 2010 3:04 PM   Additional Follow-up for Phone Call Additional follow up Details #1::        ok to refill x 1  2 refills Additional Follow-up by: Loreen Freud DO,  January 05, 2010 3:49 PM    Prescriptions: ABILIFY 5 MG TABS (ARIPIPRAZOLE) 1 by mouth once daily  #30 x 2   Entered by:   Army Fossa CMA   Authorized by:   Loreen Freud DO   Signed by:   Army Fossa CMA on 01/05/2010   Method used:   Printed then faxed to ...       [facility] [address]* (retail)       [address]       [place], [place]  [address]       Ph: [phone] or [phone]       Fax: [phone]   RxID:   [id number] LORAZEPAM 0.5 MG  TABS (LORAZEPAM) 1 by mouth three times a day as needed  #90 x 2   Entered by:   Army Fossa CMA   Authorized by:   Loreen Freud DO   Signed by:   Army Fossa CMA on 01/05/2010   Method used:   Printed then faxed to ...       [facility] [address]* (retail)       [address]       [place], [place]  [address]       Ph: [phone] or [phone]       Fax: [phone]   RxID:   [id number]

## 2010-12-08 NOTE — Progress Notes (Signed)
Summary: Refill Request  Phone Note Refill Request Call back at [phone] Message from:  Pharmacy on Mar 20, 2010 12:34 PM  Refills Requested: Medication #1:  LORAZEPAM 0.5 MG  TABS 1 by mouth three times a day as needed   Dosage confirmed as above?Dosage Confirmed   Supply Requested: 1 month   Last Refilled: 03/09/2010 [facility]  Next Appointment Scheduled: none Initial call taken by: Harold Barban,  Mar 20, 2010 12:34 PM  Follow-up for Phone Call        spoke with pharmacy rx on file from 03-11-10, disregard request..............[name] KitchenFelecia Deloach CMA  Mar 20, 2010 12:54 PM

## 2010-12-08 NOTE — Letter (Signed)
Summary: [facility]  [facility]   Imported By: Lanelle Bal 03/17/2010 13:51:51  _____________________________________________________________________  External Attachment:    Type:   Image     Comment:   External Document

## 2010-12-08 NOTE — Progress Notes (Signed)
SummaryScarlette Calico REFILL  Phone Note Refill Request Message from:  Fax from Pharmacy on July 16, 2010 1:56 PM  Refills Requested: Medication #1:  KLONOPIN 0.5 MG TABS 1 by mouth two times a day   Last Refilled: 04/16/2010   Notes: x2 [facility], [address], [place]      FAX = [phone]     QTY = 60  Initial call taken by: Jerolyn Shin,  July 16, 2010 1:57 PM  Follow-up for Phone Call        last ov- 05/19/10. Army Fossa CMA  July 16, 2010 4:15 PM   Additional Follow-up for Phone Call Additional follow up Details #1::        refill x1 Additional Follow-up by: Loreen Freud DO,  July 16, 2010 4:43 PM    Prescriptions: KLONOPIN 0.5 MG TABS (CLONAZEPAM) 1 by mouth two times a day  #60 x 0   Entered by:   Shonna Chock CMA   Authorized by:   Loreen Freud DO   Signed by:   Shonna Chock CMA on 07/16/2010   Method used:   Printed then faxed to ...       [facility]* (retail)       [address]       [place], [place]  [address]       Ph: [phone]       Fax: [phone]   RxID:   [id number]

## 2010-12-08 NOTE — Letter (Signed)
Summary: [facility]  [facility]   Imported By: Lanelle Bal 09/15/2010 13:55:48  _____________________________________________________________________  External Attachment:    Type:   Image     Comment:   External Document

## 2010-12-08 NOTE — Assessment & Plan Note (Signed)
Summary: vit b//ccm  Nurse Visit   Allergies: No Known Drug Allergies  Medication Administration  Injection # 1:    Medication: Vit B12 1000 mcg    Diagnosis: OTHER VITAMIN B12 DEFICIENCY ANEMIA (ICD-281.1)    Route: IM    Site: R deltoid    Exp Date: 08/2011    Lot #: [id number]    Mfr: American Regent    Patient tolerated injection without complications    Given by: Floydene Flock (November 13, 2009 2:00 PM)  Orders Added: 1)  Admin of Therapeutic Inj  intramuscular or subcutaneous [96372] 2)  Vit B12 1000 mcg [J3420]   Medication Administration  Injection # 1:    Medication: Vit B12 1000 mcg    Diagnosis: OTHER VITAMIN B12 DEFICIENCY ANEMIA (ICD-281.1)    Route: IM    Site: R deltoid    Exp Date: 08/2011    Lot #: [id number]    Mfr: American Regent    Patient tolerated injection without complications    Given by: Floydene Flock (November 13, 2009 2:00 PM)  Orders Added: 1)  Admin of Therapeutic Inj  intramuscular or subcutaneous [96372] 2)  Vit B12 1000 mcg [J3420]

## 2010-12-08 NOTE — Progress Notes (Signed)
Summary: Refill Request  Phone Note Refill Request Call back at [phone] Message from:  Pharmacy on Mar 09, 2010 12:48 PM  Refills Requested: Medication #1:  LORAZEPAM 0.5 MG  TABS 1 by mouth three times a day as needed   Dosage confirmed as above?Dosage Confirmed   Supply Requested: 1 month   Last Refilled: 02/05/2010 [facility]  Next Appointment Scheduled: none Initial call taken by: Harold Barban,  Mar 09, 2010 12:48 PM  Follow-up for Phone Call        last ov- 12/12/09. Army Fossa CMA  Mar 09, 2010 12:50 PM   Additional Follow-up for Phone Call Additional follow up Details #1::        refill x1 Additional Follow-up by: Loreen Freud DO,  Mar 09, 2010 1:04 PM    Prescriptions: LORAZEPAM 0.5 MG  TABS (LORAZEPAM) 1 by mouth three times a day as needed  #90 x 0   Entered by:   Army Fossa CMA   Authorized by:   Loreen Freud DO   Signed by:   Army Fossa CMA on 03/09/2010   Method used:   Printed then faxed to ...       [facility]* (retail)       [address]       [place], [place]  [address]       Ph: [phone]       Fax: [phone]   RxID:   [id number]

## 2010-12-08 NOTE — Progress Notes (Signed)
Summary: Refill Request  Phone Note Refill Request Message from:  Pharmacy on [facility] Fax #: [phone]  Refills Requested: Medication #1:  KLONOPIN 0.5 MG TABS 1 by mouth two times a day.   Dosage confirmed as above?Dosage Confirmed   Brand Name Necessary? No   Supply Requested: 1 month   Last Refilled: 12/16/2009 Next Appointment Scheduled: none Initial call taken by: Harold Barban,  January 12, 2010 1:44 PM  Follow-up for Phone Call        last ov, last filled 12/12/09. Army Fossa CMA  January 12, 2010 2:18 PM   Additional Follow-up for Phone Call Additional follow up Details #1::        ok to refill x1---2 refills Additional Follow-up by: Loreen Freud DO,  January 12, 2010 5:13 PM    Prescriptions: KLONOPIN 0.5 MG TABS (CLONAZEPAM) 1 by mouth two times a day  #60 x 2   Entered by:   Army Fossa CMA   Authorized by:   Loreen Freud DO   Signed by:   Army Fossa CMA on 01/12/2010   Method used:   Printed then faxed to ...       [facility]* (retail)       [address]       [place], [place]  [address]       Ph: [phone]       Fax: [phone]   RxID:   [id number]

## 2010-12-08 NOTE — Assessment & Plan Note (Signed)
Summary: talk about referrals//lh//b12 inj//lh   Vital Signs:  Patient profile:   65 year old female Weight:      246 pounds Temp:     98.4 degrees F oral Pulse rate:   88 / minute Pulse rhythm:   regular BP sitting:   132 / 88  (left arm) Cuff size:   large  Vitals Entered By: Army Fossa CMA (December 12, 2009 3:34 PM) CC: would like referral to cardiologist and neurologist   History of Present Illness:       This is a 65 year old woman who presents with Chest Pain.  The symptoms began >1 year ago.  Pt here c/o cont cp and palpatations.  It now occurs everyday.  Pt is requesting to go back to cardiology.   Stress test was neg.  event monitor negative per pt.  The patient reports resting chest pain and palpitations, but denies exertional chest pain, nausea, vomiting, diaphoresis, shortness of breath, dizziness, light headedness, syncope, and indigestion.  The pain is described as intermittent and pressure-like.  The pain is located in the left anterior chest.  The pain radiates to the neck.  Episodes of chest pain last 20-30 minutes.  The pain is brought on or made worse by emotional stress.  Pt has not tried lorazepam when this events occur.    Pt c/o tremors and jerking in arms with electric shock pains and she feels like they are getting worse.   Pt is requesting a Neuro referral  Current Medications (verified): 1)  Effexor Xr 150 Mg  Cp24 (Venlafaxine Hcl) .... Take Two Capsules Daily 2)  Imitrex 50 Mg  Tabs (Sumatriptan Succinate) .... As Needed 3)  Lorazepam 0.5 Mg  Tabs (Lorazepam) .[name].. 1 By Mouth Three Times A Day As Needed 4)  Cyclobenzaprine Hcl 10 Mg  Tabs (Cyclobenzaprine Hcl) .... Take 1 By Mouth Two Times A Day As Needed 5)  Abilify 5 Mg Tabs (Aripiprazole) .[name].. 1 By Mouth Once Daily 6)  Advair Diskus 250-50 Mcg/dose Aepb (Fluticasone-Salmeterol) .[name].. 1 Inh Two Times A Day 7)  Vesicare 5 Mg Tabs (Solifenacin Succinate) .[name].. 1 By Mouth Once Daily 8)  Klonopin 0.5 Mg  Tabs (Clonazepam) .[name].. 1 By Mouth Two Times A Day  Allergies (verified): No Known Drug Allergies  Past History:  Past medical, surgical, family and social histories (including risk factors) reviewed for relevance to current acute and chronic problems.  Past Medical History: Reviewed history from 12/28/2007 and no changes required. Headache Hyperlipidemia Anxiety Depression  Past Surgical History: Reviewed history from 05/15/2007 and no changes required. Hysterectomy Tonsillectomy  Family History: Reviewed history from 05/15/2007 and no changes required. Mom- MI at 56 Pt adopted  Social History: Reviewed history from 10/28/2009 and no changes required.  Married Never Smoked Alcohol use-yes Drug use-no Regular exercise-no Retired  Review of Systems      See HPI  Physical Exam  General:  Well-developed,well-nourished,in no acute distress; alert,appropriate and cooperative throughout examination Lungs:  Normal respiratory effort, chest expands symmetrically. Lungs are clear to auscultation, no crackles or wheezes. Heart:  Normal rate and regular rhythm. S1 and S2 normal without gallop, murmur, click, rub or other extra sounds. Extremities:  No clubbing, cyanosis, edema, or deformity noted with normal full range of motion of all joints.   Neurologic:  + intention tremor alert & oriented X3 and gait normal.   Psych:  Cognition and judgment appear intact. Alert and cooperative with normal attention span and concentration.  No apparent delusions, illusions, hallucinations   Impression & Recommendations:  Problem # 1:  CHEST PAIN, ATYPICAL (ICD-786.59)  Orders: Cardiology Referral (Cardiology)  Problem # 2:  TREMOR, ESSENTIAL (ICD-333.1) klonopin 0.5 mg two times a day  Orders: Neurology Referral (Neuro)  Problem # 3:  DEPRESSION/ANXIETY (ICD-300.4) klonopin0.5 mg two times a day   Problem # 4:  PALPITATIONS, RECURRENT (ICD-785.1)  cardiac w/u essentially  normal but pt requesting to see cardio again will set up appoint for her to see Dr Juanda Chance again Did discuss possibility with pt that this could be her anxiety as well ---pt aware  Orders: Cardiology Referral (Cardiology)  Complete Medication List: 1)  Effexor Xr 150 Mg Cp24 (Venlafaxine hcl) .... Take two capsules daily 2)  Imitrex 50 Mg Tabs (Sumatriptan succinate) .... As needed 3)  Lorazepam 0.5 Mg Tabs (Lorazepam) .[name].. 1 by mouth three times a day as needed 4)  Cyclobenzaprine Hcl 10 Mg Tabs (Cyclobenzaprine hcl) .... Take 1 by mouth two times a day as needed 5)  Abilify 5 Mg Tabs (Aripiprazole) .[name].. 1 by mouth once daily 6)  Advair Diskus 250-50 Mcg/dose Aepb (Fluticasone-salmeterol) .[name].. 1 inh two times a day 7)  Vesicare 5 Mg Tabs (Solifenacin succinate) .[name].. 1 by mouth once daily 8)  Klonopin 0.5 Mg Tabs (Clonazepam) .[name].. 1 by mouth two times a day  Other Orders: Admin of Therapeutic Inj  intramuscular or subcutaneous (60[address]) Vit B12 1000 mcg (U9811) Prescriptions: CYCLOBENZAPRINE HCL 10 MG  TABS (CYCLOBENZAPRINE HCL) take 1 by mouth two times a day as needed  #60 Tablet x 2   Entered and Authorized by:   Loreen Freud DO   Signed by:   Loreen Freud DO on 12/12/2009   Method used:   Print then Give to Patient   RxID:   [id number] EFFEXOR XR 150 MG  CP24 (VENLAFAXINE HCL) take two capsules daily  #60 Capsule x 5   Entered and Authorized by:   Loreen Freud DO   Signed by:   Loreen Freud DO on 12/12/2009   Method used:   Print then Give to Patient   RxID:   [id number] KLONOPIN 0.5 MG TABS (CLONAZEPAM) 1 by mouth two times a day  #60 x 0   Entered and Authorized by:   Loreen Freud DO   Signed by:   Loreen Freud DO on 12/12/2009   Method used:   Print then Give to Patient   RxID:   [id number]    Medication Administration  Injection # 1:    Medication: Vit B12 1000 mcg    Diagnosis: OTHER VITAMIN B12 DEFICIENCY ANEMIA (ICD-281.1)    Route: IM    Site: L  deltoid    Exp Date: 03/2011    Lot #: [id number]    Mfr: American Regent    Patient tolerated injection without complications    Given by: Army Fossa CMA (December 12, 2009 3:42 PM)  Orders Added: 1)  Admin of Therapeutic Inj  intramuscular or subcutaneous [96372] 2)  Vit B12 1000 mcg [J3420] 3)  Cardiology Referral [Cardiology] 4)  Neurology Referral [Neuro] 5)  Est. Patient Level III [95638]

## 2010-12-08 NOTE — Progress Notes (Signed)
Summary: refill  Phone Note Refill Request Message from:  Fax from Pharmacy  Refills Requested: Medication #1:  LORAZEPAM 0.5 MG  TABS 1 by mouth three times a day as needed [facility] fax [phone]  Initial call taken by: Okey Regal Spring,  September 18, 2010 10:28 AM  Follow-up for Phone Call        last filled 08-13-10 #90 1,   last OV 05/19/10...[name] KitchenMarland KitchenFelecia Deloach CMA  September 18, 2010 11:36 AM   Additional Follow-up for Phone Call Additional follow up Details #1::        initial 90 should have lasted pt until 11/7.  she had 1 refill on that and should have plenty of pills left.  no refills at this time. Additional Follow-up by: Neena Rhymes MD,  September 18, 2010 11:40 AM    Additional Follow-up for Phone Call Additional follow up Details #2::    Spoke with pharmacy rx was called in wrong on 08-13-10 it was only call for x1 according to pharmacy notes. Informed pharmacy med should have been called in for x1 with 1 refill. Pharmacy will refill med x1 this month.............[name] KitchenFelecia Deloach CMA  September 18, 2010 3:24 PM

## 2010-12-08 NOTE — Progress Notes (Signed)
Summary: Refill Request  Phone Note Refill Request Call back at [phone] Message from:  Pharmacy on September 30, 2010 10:59 AM  Refills Requested: Medication #1:  KLONOPIN 0.5 MG TABS 1 by mouth two times a day   Dosage confirmed as above?Dosage Confirmed   Brand Name Necessary? No   Supply Requested: 1 month   Last Refilled: 08/20/2010 [facility]  Next Appointment Scheduled: none Initial call taken by: Harold Barban,  September 30, 2010 10:59 AM  Follow-up for Phone Call        last seen 05/19/10 filled 08/20/10.[name] KitchenMarland KitchenPlease advise Follow-up by: Almeta Monas CMA Duncan Dull),  September 30, 2010 1:33 PM  Additional Follow-up for Phone Call Additional follow up Details #1::        refill x1  2 refills Additional Follow-up by: Loreen Freud DO,  September 30, 2010 1:46 PM    Prescriptions: KLONOPIN 0.5 MG TABS (CLONAZEPAM) 1 by mouth two times a day  #60 x 2   Entered by:   Almeta Monas CMA (AAMA)   Authorized by:   Loreen Freud DO   Signed by:   Almeta Monas CMA (AAMA) on 09/30/2010   Method used:   Printed then faxed to ...       [facility]* (retail)       [address]       [place], [place]  [address]       Ph: [phone]       Fax: [phone]   RxID:   [id number]

## 2010-12-08 NOTE — Assessment & Plan Note (Signed)
Summary: B-12//PH  Nurse Visit   Allergies: No Known Drug Allergies  Medication Administration  Injection # 1:    Medication: Vit B12 1000 mcg    Diagnosis: OTHER VITAMIN B12 DEFICIENCY ANEMIA (ICD-281.1)    Route: IM    Site: R deltoid    Exp Date: 08/2011    Lot #: [id number]    Mfr: American Regent    Patient tolerated injection without complications    Given by: Floydene Flock (November 21, 2009 1:43 PM)  Orders Added: 1)  Admin of Therapeutic Inj  intramuscular or subcutaneous [96372] 2)  Vit B12 1000 mcg [J3420]   Medication Administration  Injection # 1:    Medication: Vit B12 1000 mcg    Diagnosis: OTHER VITAMIN B12 DEFICIENCY ANEMIA (ICD-281.1)    Route: IM    Site: R deltoid    Exp Date: 08/2011    Lot #: [id number]    Mfr: American Regent    Patient tolerated injection without complications    Given by: Floydene Flock (November 21, 2009 1:43 PM)  Orders Added: 1)  Admin of Therapeutic Inj  intramuscular or subcutaneous [96372] 2)  Vit B12 1000 mcg [J3420]

## 2010-12-08 NOTE — Medication Information (Signed)
Summary: Letter Regarding Abilify & Weight Gain/[facility]  Letter Regarding Abilify & Weight Gain/[facility]   Imported By: Lanelle Bal 02/11/2010 12:55:38  _____________________________________________________________________  External Attachment:    Type:   Image     Comment:   External Document

## 2010-12-08 NOTE — Progress Notes (Signed)
Summary: Refill--Lorazepam  Phone Note Refill Request Message from:  Fax from Pharmacy on June 25, 2010 10:40 AM  Refills Requested: Medication #1:  LORAZEPAM 0.5 MG  TABS 1 by mouth three times a day as needed [facility] [phone]  Initial call taken by: Okey Regal Spring,  June 25, 2010 10:41 AM  Follow-up for Phone Call        05-19-10 last ov,last filled #90............[name] KitchenFelecia Deloach CMA  June 25, 2010 10:46 AM   Additional Follow-up for Phone Call Additional follow up Details #1::        ok to refill x1 Additional Follow-up by: Loreen Freud DO,  June 25, 2010 11:19 AM    Prescriptions: LORAZEPAM 0.5 MG  TABS (LORAZEPAM) 1 by mouth three times a day as needed  #90 x 0   Entered by:   Lucious Groves CMA   Authorized by:   Loreen Freud DO   Signed by:   Lucious Groves CMA on 06/25/2010   Method used:   Telephoned to ...       [facility]* (retail)       [address]       [place], [place]  [address]       Ph: [phone]       Fax: [phone]   RxID:   [id number]

## 2010-12-08 NOTE — Assessment & Plan Note (Signed)
Summary: Discuss meds/drb   Vital Signs:  Patient profile:   65 year old female Height:      65 inches Weight:      240 pounds Temp:     98.8 degrees F oral Pulse rate:   76 / minute BP sitting:   110 / 78  (left arm)  Vitals Entered By: Jeremy Johann CMA (May 19, 2010 3:16 PM) CC: discuss med Comments REVIEWED MED LIST, PATIENT AGREED DOSE AND INSTRUCTION CORRECT    History of Present Illness: Pt here to discuss her anxiety meds.   No other complaints.  She likes the klonopin better for anxiety but is only taking that as needed and takes ativan three times a day.     Current Medications (verified): 1)  Effexor Xr 150 Mg  Cp24 (Venlafaxine Hcl) .... Take Two Capsules Daily 2)  Imitrex 50 Mg  Tabs (Sumatriptan Succinate) .... As Needed 3)  Lorazepam 0.5 Mg  Tabs (Lorazepam) .[name].. 1 By Mouth Three Times A Day As Needed 4)  Cyclobenzaprine Hcl 10 Mg  Tabs (Cyclobenzaprine Hcl) .... Take 1 By Mouth Two Times A Day As Needed 5)  Abilify 5 Mg Tabs (Aripiprazole) .[name].. 1 By Mouth Once Daily 6)  Klonopin 0.5 Mg Tabs (Clonazepam) .[name].. 1 By Mouth Two Times A Day  Allergies (verified): No Known Drug Allergies  Past History:  Past medical, surgical, family and social histories (including risk factors) reviewed for relevance to current acute and chronic problems.  Past Medical History: Reviewed history from 12/31/2009 and no changes required. Current Problems:  PALPITATIONS, RECURRENT (ICD-785.1) DIZZINESS (ICD-780.4) CHEST PAIN, ATYPICAL (ICD-786.59) FAMILY HISTORY OF CAD FEMALE 1ST DEGREE RELATIVE <60 (ICD-V16.49) HYPERLIPIDEMIA (ICD-272.4) FATIGUE (ICD-780.79) DEPRESSION/ANXIETY (ICD-300.4) GERD (ICD-530.81) MORBID OBESITY (ICD-278.01) TREMOR, ESSENTIAL (ICD-333.1) OTHER VITAMIN B12 DEFICIENCY ANEMIA (ICD-281.1) FEMALE STRESS INCONTINENCE (ICD-625.6) PREVENTIVE HEALTH CARE (ICD-V70.0) MEMORY LOSS (ICD-780.93) ANEMIA, MILD (ICD-285.9) PICA (ICD-307.52) ACUTE BRONCHITIS  (ICD-466.0) SYMPTOM, PAIN, ABDOMINAL, EPIGASTRIC (ICD-789.06) MYALGIA (ICD-729.1) ARTIFICIAL MENOPAUSE (ICD-627.4) PREVENTIVE HEALTH CARE (ICD-V70.0) ELECTIVE SURGERY, PLASTIC SURGERY NEC (ICD-V50.1) FACE LIFT (ICD-V50.1) REDUCTION MAMMOPLASTY, HX OF (ICD-V15.9) PSTPRC STATUS, BARIATRIC SURGERY (ICD-V45.86) HEADACHE (ICD-784.0) FIBROMYALGIA (ICD-729.1)  Past Surgical History: Reviewed history from 12/31/2009 and no changes required. Hysterectomy Tonsillectomy  Laparoscopic Roux-en-Y gastrojejunostomy (antecolic, antegastric), esophagogastroscopy.  Family History: Reviewed history from 12/31/2009 and no changes required.  Positive for father had a heart attack in his 49s.  She   is adopted, so she does not know about her mother and siblings.      Social History: Reviewed history from 12/31/2009 and no changes required. Married Never Smoked Alcohol use-yes Drug use-no Regular exercise-no Retired  Review of Systems      See HPI  Physical Exam  General:  Well-developed,well-nourished,in no acute distress; alert,appropriate and cooperative throughout examination Lungs:  Normal respiratory effort, chest expands symmetrically. Lungs are clear to auscultation, no crackles or wheezes. Heart:  Normal rate and regular rhythm. S1 and S2 normal without gallop, murmur, click, rub or other extra sounds. Psych:  Cognition and judgment appear intact. Alert and cooperative with normal attention span and concentration. No apparent delusions, illusions, hallucinations   Impression & Recommendations:  Problem # 1:  DEPRESSION/ANXIETY (ICD-300.4) con't effexor xr take klonopin regularly take ativan only as needed con't abilify  Problem # 2:  HEADACHE (ICD-784.0)  Her updated medication list for this problem includes:    Imitrex 50 Mg Tabs (Sumatriptan succinate) .[name]... As needed  Problem # 3:  FIBROMYALGIA (ICD-729.1)  Her updated medication list for  this problem includes:     Cyclobenzaprine Hcl 10 Mg Tabs (Cyclobenzaprine hcl) .[name]... Take 1 by mouth two times a day as needed  Complete Medication List: 1)  Effexor Xr 150 Mg Cp24 (Venlafaxine hcl) .... Take two capsules daily 2)  Imitrex 50 Mg Tabs (Sumatriptan succinate) .... As needed 3)  Lorazepam 0.5 Mg Tabs (Lorazepam) .[name].. 1 by mouth three times a day as needed 4)  Cyclobenzaprine Hcl 10 Mg Tabs (Cyclobenzaprine hcl) .... Take 1 by mouth two times a day as needed 5)  Abilify 5 Mg Tabs (Aripiprazole) .[name].. 1 by mouth once daily 6)  Klonopin 0.5 Mg Tabs (Clonazepam) .[name].. 1 by mouth two times a day  Patient Instructions: 1)  take the klonopin regularly two times a day  2)  take ativan only as needed 3)  con't other meds  4)  recheck 3 months Prescriptions: CYCLOBENZAPRINE HCL 10 MG  TABS (CYCLOBENZAPRINE HCL) take 1 by mouth two times a day as needed  #60 Tablet x 2   Entered and Authorized by:   Loreen Freud DO   Signed by:   Loreen Freud DO on 05/19/2010   Method used:   Electronically to        [facility]* (retail)       [address]       [place], [place]  [address]       Ph: [phone]       Fax: [phone]   RxID:   [id number] LORAZEPAM 0.5 MG  TABS (LORAZEPAM) 1 by mouth three times a day as needed  #90 x 0   Entered and Authorized by:   Loreen Freud DO   Signed by:   Loreen Freud DO on 05/19/2010   Method used:   Print then Give to Patient   RxID:   [id number] EFFEXOR XR 150 MG  CP24 (VENLAFAXINE HCL) take two capsules daily  #60 Capsule x 5   Entered and Authorized by:   Loreen Freud DO   Signed by:   Loreen Freud DO on 05/19/2010   Method used:   Electronically to        [facility]* (retail)       [address]       [place], [place]  [address]       Ph: [phone]       Fax: [phone]   RxID:   [id number]

## 2010-12-08 NOTE — Progress Notes (Signed)
Summary: Refill Request  Phone Note Refill Request Call back at [phone] Message from:  Pharmacy on April 16, 2010 11:53 AM  Refills Requested: Medication #1:  KLONOPIN 0.5 MG TABS 1 by mouth two times a day.   Dosage confirmed as above?Dosage Confirmed   Brand Name Necessary? No   Supply Requested: 1 month   Last Refilled: 03/20/2010 [facility]  Next Appointment Scheduled: 6.16.11 Initial call taken by: Harold Barban,  April 16, 2010 11:54 AM  Follow-up for Phone Call        last ov- 12/12/09. Army Fossa CMA  April 16, 2010 12:11 PM   Additional Follow-up for Phone Call Additional follow up Details #1::        refill x1  2 refills Additional Follow-up by: Loreen Freud DO,  April 16, 2010 1:18 PM    Prescriptions: KLONOPIN 0.5 MG TABS (CLONAZEPAM) 1 by mouth two times a day  #60 x 2   Entered by:   Army Fossa CMA   Authorized by:   Loreen Freud DO   Signed by:   Army Fossa CMA on 04/16/2010   Method used:   Printed then faxed to ...       [facility]* (retail)       [address]       [place], [place]  [address]       Ph: [phone]       Fax: [phone]   RxID:   [id number]

## 2010-12-08 NOTE — Progress Notes (Signed)
Summary: refill   Phone Note Refill Request Message from:  Patient on August 19, 2010 3:13 PM  Refills Requested: Medication #1:  KLONOPIN 0.5 MG TABS 1 by mouth two times a day [facility]  Initial call taken by: Okey Regal Spring,  August 19, 2010 3:13 PM  Follow-up for Phone Call        last filled 07/16/10 last seen 05/19/10. PLease advise Follow-up by: Almeta Monas CMA Duncan Dull),  August 19, 2010 4:28 PM  Additional Follow-up for Phone Call Additional follow up Details #1::        ok to refillx1 Additional Follow-up by: Loreen Freud DO,  August 20, 2010 9:05 AM    Prescriptions: KLONOPIN 0.5 MG TABS (CLONAZEPAM) 1 by mouth two times a day  #60 x 0   Entered by:   Almeta Monas CMA (AAMA)   Authorized by:   Loreen Freud DO   Signed by:   Almeta Monas CMA (AAMA) on 08/20/2010   Method used:   Printed then faxed to ...       [facility]* (retail)       [address]       [place], [place]  [address]       Ph: [phone]       Fax: [phone]   RxID:   [id number]  Pt aware Rx has been faxed.   Almeta Monas CMA Duncan Dull)  August 20, 2010 9:07 AM

## 2010-12-08 NOTE — Progress Notes (Signed)
Summary: refill  Phone Note Refill Request Message from:  Patient  Refills Requested: Medication #1:  LORAZEPAM 0.5 MG  TABS 1 by mouth three times a day as needed   Last Refilled: 03/09/2010 fax to [facility]  Initial call taken by: Okey Regal Spring,  April 10, 2010 3:51 PM  Follow-up for Phone Call        last ov-12/12/09  Additional Follow-up for Phone Call Additional follow up Details #1::        ok for #90, no refills (pt is taking this as a scheduled med and not as needed based on frequency of refills- please make sure Dr Laury Axon is aware of this) Additional Follow-up by: Neena Rhymes MD,  April 10, 2010 3:55 PM    Additional Follow-up for Phone Call Additional follow up Details #2::    pt should not be taking it three times a day regularly----other meds need to be discussed if that is the case. Follow-up by: Loreen Freud DO,  April 11, 2010 3:31 PM  Additional Follow-up for Phone Call Additional follow up Details #3:: Details for Additional Follow-up Action Taken: Pt has appt for next thursday to discuss meds. Army Fossa CMA  April 13, 2010 11:18 AM   Prescriptions: LORAZEPAM 0.5 MG  TABS (LORAZEPAM) 1 by mouth three times a day as needed  #90 x 0   Entered by:   Army Fossa CMA   Authorized by:   Neena Rhymes MD   Signed by:   Army Fossa CMA on 04/10/2010   Method used:   Printed then faxed to ...       [facility]* (retail)       [address]       [place], [place]  [address]       Ph: [phone]       Fax: [phone]   RxID:   [id number]

## 2010-12-08 NOTE — Assessment & Plan Note (Signed)
Summary: b12 inj//lch  Nurse Visit   Allergies: No Known Drug Allergies  Medication Administration  Injection # 1:    Medication: Vit B12 1000 mcg    Diagnosis: OTHER VITAMIN B12 DEFICIENCY ANEMIA (ICD-281.1)    Route: IM    Site: L deltoid    Exp Date: 09/2011    Lot #: [id number]    Mfr: American Regent    Patient tolerated injection without complications    Given by: Floydene Flock (December 29, 2009 2:01 PM)  Orders Added: 1)  Admin of Therapeutic Inj  intramuscular or subcutaneous [96372] 2)  Vit B12 1000 mcg [J3420]   Medication Administration  Injection # 1:    Medication: Vit B12 1000 mcg    Diagnosis: OTHER VITAMIN B12 DEFICIENCY ANEMIA (ICD-281.1)    Route: IM    Site: L deltoid    Exp Date: 09/2011    Lot #: [id number]    Mfr: [employer] Regent    Patient tolerated injection without complications    Given by: Floydene Flock (December 29, 2009 2:01 PM)  Orders Added: 1)  Admin of Therapeutic Inj  intramuscular or subcutaneous [96372] 2)  Vit B12 1000 mcg [J3420]

## 2010-12-10 NOTE — Progress Notes (Signed)
Summary: Cyclobenzaprine refill-- Dr.Lowne Patient  Phone Note Refill Request Message from:  Fax from Pharmacy on November 04, 2010 11:37 AM  Refills Requested: Medication #1:  CYCLOBENZAPRINE HCL 10 MG  TABS take 1 by mouth two times a day as needed [facility],   [address], [place], [place]   phone- [phone],  fax - [phone]    qty = 60  Next Appointment Scheduled: none Initial call taken by: Jerolyn Shin,  November 04, 2010 11:40 AM  Follow-up for Phone Call        last seen 05/19/10 and filled 09/04/10. please advise Follow-up by: Almeta Monas CMA Duncan Dull),  November 04, 2010 1:02 PM  Additional Follow-up for Phone Call Additional follow up Details #1::        ok for #30, no refills.  if additional needed will need to discuss w/ Dr Laury Axon when she returns Additional Follow-up by: Neena Rhymes MD,  November 04, 2010 1:12 PM    Prescriptions: CYCLOBENZAPRINE HCL 10 MG  TABS (CYCLOBENZAPRINE HCL) take 1 by mouth two times a day as needed  #30 Tablet x 0   Entered by:   Almeta Monas CMA (AAMA)   Authorized by:   Neena Rhymes MD   Signed by:   Almeta Monas CMA (AAMA) on 11/04/2010   Method used:   Faxed to ...       [facility]* (retail)       [address]       [place], [place]  [address]       Ph: [phone]       Fax: [phone]   RxID:   [id number]

## 2010-12-10 NOTE — Progress Notes (Signed)
Summary: Refill Request  Phone Note Refill Request   Refills Requested: Medication #1:  CYCLOBENZAPRINE HCL 10 MG  TABS take 1 by mouth two times a day as needed [facility]***Call from patient and she stated we called in the wrong RX, I advised the Rx was correct but we called in a different quantity, and I advised that was the Dr. Wille Glaser choice, stated she now needs a refill. I advise I will send the request with a quantity of 60 as normal, she agreed..Last seen 05/19/10 and filled 11/04/10 with a Q# of 30.. please advise if it is ok to switch back to Q 60.....   Method Requested: Fax to Local Pharmacy Initial call taken by: Almeta Monas CMA Duncan Dull),  November 19, 2010 11:42 AM  Follow-up for Phone Call        yes that is ok refill x1  1 refill Follow-up by: Loreen Freud DO,  November 19, 2010 12:01 PM    Prescriptions: CYCLOBENZAPRINE HCL 10 MG  TABS (CYCLOBENZAPRINE HCL) take 1 by mouth two times a day as needed  #60 x 1   Entered by:   Almeta Monas CMA (AAMA)   Authorized by:   Loreen Freud DO   Signed by:   Almeta Monas CMA (AAMA) on 11/19/2010   Method used:   Faxed to ...       [facility]* (retail)       [address]       [place], [place]  [address]       Ph: [phone]       Fax: [phone]   RxID:   [id number]

## 2010-12-10 NOTE — Progress Notes (Signed)
Summary: refill  Phone Note Refill Request Message from:  Fax from Pharmacy on October 26, 2010 1:14 PM  Refills Requested: Medication #1:  LORAZEPAM 0.5 MG  TABS 1 by mouth three times a day as needed [facility]  - fax [phone]  Initial call taken by: Okey Regal Spring,  October 26, 2010 1:15 PM  Follow-up for Phone Call        last seen 05/19/10 and filled 08/13/10... please advise Follow-up by: Almeta Monas CMA Duncan Dull),  October 26, 2010 3:31 PM  Additional Follow-up for Phone Call Additional follow up Details #1::        refill x1 Additional Follow-up by: Loreen Freud DO,  October 26, 2010 4:00 PM    Prescriptions: LORAZEPAM 0.5 MG  TABS (LORAZEPAM) 1 by mouth three times a day as needed  #90 x 0   Entered by:   Almeta Monas CMA (AAMA)   Authorized by:   Loreen Freud DO   Signed by:   Almeta Monas CMA (AAMA) on 10/26/2010   Method used:   Printed then faxed to ...       [facility]* (retail)       [address]       [place], [place]  [address]       Ph: [phone]       Fax: [phone]   RxID:   [id number]

## 2010-12-10 NOTE — Progress Notes (Signed)
Summary: Refill Request  Phone Note Refill Request Call back at [phone] Message from:  Pharmacy on November 27, 2010 3:44 PM  Refills Requested: Medication #1:  LORAZEPAM 0.5 MG  TABS 1 by mouth three times a day as needed   Dosage confirmed as above?Dosage Confirmed   Supply Requested: 90   Last Refilled: 10/26/2010 [facility]  Next Appointment Scheduled: none Initial call taken by: Harold Barban,  November 27, 2010 3:44 PM    Prescriptions: LORAZEPAM 0.5 MG  TABS (LORAZEPAM) 1 by mouth three times a day as needed  #90 x 0   Entered by:   Doristine Devoid CMA   Authorized by:   Loreen Freud DO   Signed by:   Doristine Devoid CMA on 11/27/2010   Method used:   Printed then faxed to ...       [facility]* (retail)       [address]       [place], [place]  [address]       Ph: [phone]       Fax: [phone]   RxID:   [id number]

## 2010-12-29 ENCOUNTER — Telehealth: Payer: Self-pay | Admitting: Family Medicine

## 2011-01-05 NOTE — Progress Notes (Signed)
Summary: refill  Phone Note Refill Request Message from:  Fax from Pharmacy on December 29, 2010 11:59 AM  Refills Requested: Medication #1:  KLONOPIN 0.5 MG TABS 1 by mouth two times a day   Last Refilled: 09/30/2010  Medication #2:  LORAZEPAM 0.5 MG  TABS 1 by mouth three times a day as needed   Last Refilled: 11/27/2010 [facility] - fax [phone]  Initial call taken by: Okey Regal Spring,  December 29, 2010 12:00 PM  Follow-up for Phone Call        lorazepam last filled 11/27/10 q #90 and klonopin filled 09/30/10 and seen 05/19/10.[name] KitchenMarland Kitchenplease advise Follow-up by: Almeta Monas CMA Duncan Dull),  December 29, 2010 1:42 PM  Additional Follow-up for Phone Call Additional follow up Details #1::        ok to refill both x1 Additional Follow-up by: Loreen Freud DO,  December 29, 2010 1:44 PM    Prescriptions: KLONOPIN 0.5 MG TABS (CLONAZEPAM) 1 by mouth two times a day  #60 x 0   Entered by:   Almeta Monas CMA (AAMA)   Authorized by:   Loreen Freud DO   Signed by:   Almeta Monas CMA (AAMA) on 12/29/2010   Method used:   Printed then faxed to ...       [facility] (retail)       [address]       [place], [place]  [address]       Ph: [phone]       Fax: [phone]   RxID:   [id number] LORAZEPAM 0.5 MG  TABS (LORAZEPAM) 1 by mouth three times a day as needed  #90 x 0   Entered by:   Almeta Monas CMA (AAMA)   Authorized by:   Loreen Freud DO   Signed by:   Almeta Monas CMA (AAMA) on 12/29/2010   Method used:   Printed then faxed to ...       [facility] (retail)       [address]       [place], [place]  [address]       Ph: [phone]       Fax: [phone]   RxID:   [id number]

## 2011-01-25 ENCOUNTER — Telehealth: Payer: Self-pay | Admitting: Family Medicine

## 2011-01-26 ENCOUNTER — Other Ambulatory Visit: Payer: Self-pay | Admitting: Family Medicine

## 2011-01-26 MED ORDER — CLONAZEPAM 0.5 MG PO TABS: 0.5000 mg | ORAL_TABLET | Freq: Two times a day (BID) | ORAL | Status: DC

## 2011-01-26 NOTE — Telephone Encounter (Signed)
Ok to refill x1  2 refills 

## 2011-01-26 NOTE — Telephone Encounter (Signed)
Clonazepam 0.5mg  requested last filled 12/29/10 and last seen 06/02/10 please advise.... KP

## 2011-01-29 ENCOUNTER — Other Ambulatory Visit: Payer: Self-pay | Admitting: Family Medicine

## 2011-01-29 ENCOUNTER — Telehealth: Payer: Self-pay

## 2011-01-29 MED ORDER — LORAZEPAM 0.5 MG PO TABS: 0.5000 mg | ORAL_TABLET | Freq: Three times a day (TID) | ORAL | Status: DC | PRN

## 2011-01-29 MED ORDER — VENLAFAXINE HCL ER 150 MG PO CP24: 300.0000 mg | ORAL_CAPSULE | Freq: Every day | ORAL | Status: DC

## 2011-01-29 NOTE — Telephone Encounter (Signed)
Pt walked in stating that the pharmacy has called 3 times regarding her RX for Effexor and Lorazapam. [facility].

## 2011-01-29 NOTE — Telephone Encounter (Signed)
Last seen 05/19/10--- Effexor filled 07/17/10 with 5 refills, Lorazepam 12/29/10 with no refills...[name] KitchenMarland KitchenPlease advise          KP

## 2011-01-29 NOTE — Telephone Encounter (Signed)
Ok to refill effexor with 5 refills and lorazepam 1x

## 2011-01-29 NOTE — Telephone Encounter (Signed)
Both RX's faxed to pharmacy    KP

## 2011-02-01 NOTE — Telephone Encounter (Signed)
Duplicate request.    KP 

## 2011-02-04 NOTE — Progress Notes (Signed)
Summary: refill  Phone Note Refill Request Message from:  Fax from Pharmacy  Refills Requested: Medication #1:  LORAZEPAM 0.5 MG  TABS 1 by mouth three times a day as needed [facility] - fax [phone]  Initial call taken by: Okey Regal Spring,  January 25, 2011 4:22 PM  Follow-up for Phone Call        last seen 05/19/10 and filled 12/29/10 please advise Follow-up by: Almeta Monas CMA Duncan Dull),  January 26, 2011 9:04 AM  Additional Follow-up for Phone Call Additional follow up Details #1::        duplicate request Additional Follow-up by: Almeta Monas CMA Duncan Dull),  January 26, 2011 2:11 PM

## 2011-02-26 ENCOUNTER — Other Ambulatory Visit: Payer: Self-pay | Admitting: Family Medicine

## 2011-03-02 ENCOUNTER — Other Ambulatory Visit: Payer: Self-pay

## 2011-03-02 NOTE — Telephone Encounter (Signed)
Last filled 01/29/11 and seen 05/19/10 please advise     KP

## 2011-03-03 MED ORDER — LORAZEPAM 0.5 MG PO TABS: 0.5000 mg | ORAL_TABLET | Freq: Three times a day (TID) | ORAL | Status: DC | PRN

## 2011-03-03 NOTE — Telephone Encounter (Signed)
Faxed.   KP 

## 2011-03-11 ENCOUNTER — Other Ambulatory Visit: Payer: Self-pay | Admitting: Family Medicine

## 2011-03-11 NOTE — Telephone Encounter (Signed)
Last seen 05/19/10 and filled 02/11/11    Please advise    KP

## 2011-03-11 NOTE — Telephone Encounter (Signed)
RX faxed    KP 

## 2011-03-23 NOTE — Assessment & Plan Note (Signed)
[facility]                            CARDIOLOGY OFFICE NOTE   [name], [name]                        MRN:          [id number]  DATE:07/06/2007                            DOB:          Jun 02, 1946    REFERRING PHYSICIAN:  Lelon Perla, DO   CARDIOLOGY CONSULTATION NOTE   PRIMARY CARE PHYSICIAN:  Lelon Perla, DO.   REASON FOR REFERRAL:  Evaluation of palpitations and chest pain, and  dizziness.   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  She has been  recently having symptoms of palpitations that she describes as a  fluttering in her chest followed by a rapid heartbeat, which may last 4  to 5 minutes and occur several times a week.  These symptoms come on  without provocation and do not appear to be related to activity.  She  had some associated chest tightness, dizziness, and headache with these  palpitations.   We had evaluated back in 2002 for symptoms of chest pain and  palpitations.  She had an echocardiogram, which showed some thickening  of the mitral valve.  She had a negative Cardiolite scan, and she had an  event monitor that showed no arrhythmias.  [name] had ordered an  exercise Myoview scan on her recently, and this was negative with no  evidence of ischemia with good left ventricular function.   Her past medical history is negative for diabetes, hypertension,  hyperlipidemia.   CURRENT MEDICATIONS:  Cyclobenzaprine.  Imitrex.  Naproxen.  Darvocet.  Effexor.  Calcium.  Vitamins.   FAMILY HISTORY:  Positive for father had a heart attack in his 77s.  She  is adopted, so she does not know about her mother and siblings.   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  She does not smoke.  She has 2 children.   REVIEW OF SYSTEMS:  Positive for symptoms of fatigue and arthralgias,  and she sees Dr.  Ethelene Fischer for this.   EXAMINATION:  Blood pressure is 138/89, pulse 94 and regular.  There was no venous distension.  Carotid pulses were full without  bruits.  CHEST:  Clear.  There were no rales or rhonchi.  CARDIAC:  Rhythm was regular.  The first and second heart sounds were  normal.  There are no murmurs or gallops.  ABDOMEN:  Soft.  The bowel sounds were normal.  There is no  hepatosplenomegaly.  There was trace peripheral edema.  The pedal pulses were equal.  MUSCULOSKELETAL:  No deformities.  SKIN:  Warm and dry.  NEUROLOGIC:  No focal neurological signs.   An electrocardiogram showed poor R wave progression.   IMPRESSION:  1. Palpitations and dizziness of uncertain etiology.  2. Mild mitral valve thickening by a previous echocardiography.  3. History of myalgias and arthralgias, followed by [name].   RECOMMENDATIONS:  Ms. Name symptoms sound like cardiac arrhythmia.  We will plan to get an event monitor to  see if we can identify this.  Will also get an echocardiogram to reevaluate her for structural heart  disease and see if there has been any change in her abnormal mitral  valve.  We will get laboratory work including a TSH, BNP, CBC, and lipid  and liver profiles.  I will plan to see her back in 2 to 3 weeks to  followup on results of her tests with her.     Bruce Elvera Lennox Juanda Chance, MD, [facility]  Electronically Signed    BRB/MedQ  DD: 07/06/2007  DT: 07/07/2007  Job #: [id number]

## 2011-03-26 ENCOUNTER — Other Ambulatory Visit: Payer: Self-pay

## 2011-03-26 MED ORDER — LORAZEPAM 0.5 MG PO TABS: 0.5000 mg | ORAL_TABLET | Freq: Three times a day (TID) | ORAL | Status: DC | PRN

## 2011-03-26 NOTE — Discharge Summary (Signed)
NAMECYNDA, SOULE                             ACCOUNT NO.:  [id number]   MEDICAL RECORD NO.:  [id number]                   PATIENT TYPE:  INP   LOCATION:  4714                                 FACILITY:  [facility]   PHYSICIAN:  Doylene Canning. Ladona Ridgel, M.D. [facility]           DATE OF BIRTH:  [birth year]   DATE OF ADMISSION:  12/12/2002  DATE OF DISCHARGE:  12/13/2002                           DISCHARGE SUMMARY - REFERRING   HISTORY OF PRESENT ILLNESS:  The patient is a 65 year old female who  presented with progressive substernal chest discomfort which she described  as a tightness associated with shortness of breath for one month at an 8 on  a scale of 0-10.  Radiation to the throat lasting approximately 15 minutes  and this occurred every night for the last two weeks and occasionally during  the daytime.  The discomfort is not associated with exertion.  She has also  noted shortness of breath and nausea.  She has tried aspirin with  questionable benefit and she has not tried any other prescriptions.  She  also describes GERD symptoms for one week.  She is not very active and she  has gained weight secondary to this.  She feels that she has a lot of  musculoskeletal discomfort which contributes to her inactivity.  Her history  is notable for hypertension, family history of coronary artery disease,  mitral valve prolapse, fibromyalgia, osteoarthritis and a negative  Cardiolite approximately one year ago.  It is noted that she has gained 120  pounds in the last 18 months.   LABORATORY DATA AND X-RAY FINDINGS:  Admission CKs and troponins were  negative for myocardial infarction.  TSH was 1.116.  Sodium 139, potassium  3.8, BUN 10, creatinine 0.7 with normal LFTs, glucose 104.  PT  13.3, PTT  29.  D-dimer 0.23.  H&H 13.5 and 37.9.  Normal indices.  Platelets 283 with  WBC 8.0.   Chest x-ray did not show any active disease.  EKGs showed sinus tachycardia  with a ventricular rate of 105, normal axis  with left anterior hemiblock.   HOSPITAL COURSE:  The patient was admitted to the hospital overnight.  She  ruled out for myocardial infarction with her repeated prior cardiac work-up  that were negative.  It was decided to proceed with cardiac catheterization  for definitive diagnosis.  On December 13, 2002, Dr. Eden Emms performed cardiac  catheterization.  There was no coronary artery disease and EF was 65%.  She  had a left dominant system with no evidence of aortic dissection.  Her  aortic pressure was 138/86, LV 138/14.  Dr. Eden Emms felt that her symptoms  were not cardiac related and felt that she could be discharged home after  her prescribed bed rest and ambulation post catheterization with follow up  with Dr. Ruthine Dose.   DISCHARGE DIAGNOSES:  1. Noncardiac chest discomfort.  2. Normal cardiac catheterization.  3.  Cardiolite one year ago.  4. Echocardiogram.   DISPOSITION:  She is discharged to home.   MEDICATIONS:  1. Effexor 75 mg q.d.  2. Hormone patch.  3. Muscle relaxer.   ACTIVITY:  She was advised of no lifting, driving, sexual activity or heavy  exertion for two days.   DIET:  Maintain low salt, low fat and low cholesterol diet.  Consider a  weight loss program.   SPECIAL INSTRUCTIONS:  If she has any problems with catheterization site,  she was asked to call us immediately.   FOLLOW UP:  She will arrange a follow-up appointment with Dr. Ruthine Dose for  reoccurring chest discomfort of undetermined etiology.  She will have a  groin check with [facility] on Friday, February 13, at noon.  Consideration at groin check to release her from cardiac practice and she  does not have any cardiac issues at this time.  However, encouragement  should be given to cardiac risk factor modification.     Joellyn Rued, P.A. [facility]                    Doylene Canning. Ladona Ridgel, M.D. [facility]    EW/MEDQ  D:  12/13/2002  T:  12/13/2002  Job:  [id number]   cc:   Angelena Sole, M.D.    Charlies Constable, M.D. [facility]  [address]  [place]  [place] [address]

## 2011-03-26 NOTE — Cardiovascular Report (Signed)
   [name], [name]                             ACCOUNT NO.:  [id number]   MEDICAL RECORD NO.:  [id number]                   PATIENT TYPE:  INP   LOCATION:  4714                                 FACILITY:  [facility]   PHYSICIAN:  Charlton Haws, M.D. [facility]              DATE OF BIRTH:  1946/02/01   DATE OF PROCEDURE:  12/13/2002  DATE OF DISCHARGE:                              CARDIAC CATHETERIZATION   PROCEDURE:  Coronary arteriography.   INDICATION:  Recurrent chest pain.  The patient is a 65 year old, patient of  Dr. Ruthine Dose and Dr. Juanda Chance.  She has had a normal Cardiolite study and a  normal echocardiogram done in December of 2003.  She is having recurrent  chest pain and subsequently admitted to rule out coronary disease.   DESCRIPTION OF PROCEDURE:  Standard catheterization was done from the right  femoral artery.  The patient seemed to ooze a little bit from her skin  incision site prior to insertion of the catheter.  The patient had a large  left dominant coronary circulation.   RESULTS:  1. Left main coronary artery was normal.  2. Left anterior descending artery was normal.  First and second diagonal     branches were normal.  3. The circumflex coronary artery was normal. There were two large obtuse     marginal branches and a large PDA branch which were normal.  4. The right coronary artery was normal, small and nondominant.   RIGHT ANTERIOR OBLIQUE VENTRICULOGRAPHY:  RAO ventriculography was normal.  Ejection fraction 65%.  There is no gradient across the aortic valve and no  MR.  Aortic pressure is 138/86, LV pressure is 139/14.    IMPRESSION:  The patient's symptoms does not appear to be cardiac in  etiology.  She will be discharged home later today as long as her chest x-  ray is normal.   PLAN:  Further work-up will be based on Dr. Myrtie Cruise advice.                                                   Charlton Haws, M.D. [facility]    PN/MEDQ  D:  12/13/2002  T:  12/13/2002   Job:  [id number]   cc:   Charlies Constable, M.D. [facility]  [address]  [place]  [place] [address]   Angelena Sole, M.D. [facility]

## 2011-03-26 NOTE — Op Note (Signed)
[facility]  Patient:    [name], [name]                           MRN: [id number] Proc. Date: 03/31/01 Adm. Date:  [id number] Attending:  Marlowe Kays Page                           Operative Report  PREOPERATIVE DIAGNOSES: 1. Torn lateral meniscus. 2. Osteoarthritis right knee.  POSTOPERATIVE DIAGNOSES: 1. Torn lateral menisci. 2. Osteoarthritis right knee.  OPERATION: 1. Right knee arthroscopy with one partial lateral meniscectomy. 2. Shaving of medial femoral condyle and patella.  SURGEON:  Illene Labrador. [place], M.D.  ASSISTANT:  Nurse.  ANESTHESIA:  General.  PATHOLOGY AND JUSTIFICATION FOR PROCEDURE:  Chronic pain, both medially and laterally in her right knee, and MRI has demonstrated tears of both the anterior and posterior lateral meniscus as well as some mild osteoarthritic changes.  At surgery, I found tear of the anterior third of the lateral meniscus as well as the intercondylar portion.  The medial meniscus was intact, but she had grade 2-3 chondromalacia over the weightbearing surface of the medial femoral condyle which probably accounted for her medial knee pain. She also had the same layer in her patella with both facets of the patella being worn.  DESCRIPTION OF PROCEDURE:  Satisfactory general anesthesia.  She was given prophylactic antibiotics because of mitral valve prolapse and will be given a 24-hour prescription for antibiotics postsurgically.  The pneumatic tourniquet, thigh stabilizer.  The right leg was prepped with Dura-Prep from stabilizer to the ankle and draped in a sterile field.  Superior medial saline inflow.  First through an anterolateral portal, the medial compartment of knee joint was evaluated.  The extensively worn area of the medial femoral condyle was debrided down with baskets and shaved down until smooth with the 3.5 shaver with prefilms and postfilms being taken.  The medial meniscus was visualized,  probed, and pictured.  Looking up in the medial gutter and suprapatellar area, wear on the medial half of the patella was noted, pictured, and shaved.  I then replaced portals.  There was a good bit of synovitis which I resected, and the disrupted areas of the lateral meniscus were pictured.  The anterior portion I handles by using small scissors to cut the distal portion of the tear near its anterior attachment with it being a layer type of tear and then cutting at the junction of the anterior and mid third with scissors and also shaving until smooth with the 3.5 shaver and continued the shaving to the middle third, which was also partially disrupted. This left a thinner but smooth residual rim.  The intercondylar tear, I resected back to a stable rim with small baskets and then shaved down until smooth with the 3.5 shaver as well.  She had minimal wear of the joint surfaces laterally.  I then looked up in the lateral gutter and suprapatellar area and completed the shaving of the patella laterally, which also had a lot facet wear.  The knee joint was then irrigated until clear and all fluid possible removed.  The two anterior portals were closed with 4-0 nylon.  Then, 20 cc 0.5% Marcaine with adrenalin, 4 mg morphine were then instilled through the inflow apparatus which was removed, and this portal closed with 4-0 nylon as well.  Betadine and Adaptic dry sterile dressing were applied.  Tourniquet was released.  She tolerated the procedure well and was taken to the recovery room in satisfactory condition with no known complications. DD:  03/31/01 TD:  03/31/01 Job: [id number]

## 2011-03-26 NOTE — H&P (Signed)
[name], Fischer                             ACCOUNT NO.:  [id number]   MEDICAL RECORD NO.:  [id number]                   PATIENT TYPE:  INP   LOCATION:  4714                                 FACILITY:  [facility]   PHYSICIAN:  Doylene Canning. Ladona Ridgel, M.D. [facility]           DATE OF BIRTH:  [birth year]   DATE OF ADMISSION:  12/12/2002  DATE OF DISCHARGE:                                HISTORY & PHYSICAL   ADMITTING DIAGNOSES:  1. Chest pain.  2. Shortness of breath.   PRIMARY CARE PHYSICIAN:  Angelena Sole, M.D. [facility]   PRIMARY CARDIOLOGIST:  Charlies Constable, M.D. [facility]   CHIEF COMPLAINT:  Chest pressure and shortness of breath.   HISTORY OF PRESENT ILLNESS:  The patient is a very pleasant 65 year old  woman with a history of increasing shortness of breath, substernal chest  tightness, associated with left arm discomfort for approximately 1 month.  The patient has previously been healthy but over the last year has had  increasing weight gain secondary to inactivity and overeating.  She notes  that she has gained nearly 100 pounds.  She was initially seen over a year  ago by Dr. Juanda Chance and had a Cardiolite stress test which was reportedly  unremarkable.  She had had at that time no chest discomfort.  Over the last  month, and particularly over the last several days, she has had increasing  dyspnea with exertion.  In addition, she gets chest pressure which sometimes  occurs with exertion but at other times it occurs in the absence of  exertion.  The pressure radiates into the throat and into the left arm.  It  may last as long as 15 minutes.  There is no clearcut relieving or  exacerbating factors.  There is no sour taste in the mouth and the symptoms  of angina appear to be pleuritic in nature.  She denies syncope.   PAST MEDICAL HISTORY:  Past medical history is as previously noted.  In  addition, there is a history of mitral prolapse and a history of  fibromyalgia.   SOCIAL HISTORY:  The patient  is married and lives in [place].  She denies  tobacco or ethanol abuse.  She works as a Runner, broadcasting/film/video.   FAMILY HISTORY:  Mother died at age 19 of an MI.  We have no information  about her father as she was adopted.   REVIEW OF SYSTEMS:  Review of systems notable for a 100- to 120-pound weight  gain in the last 1-1/2 years, she denies vision or hearing problems, she  denies any skin rashes or lesions.  She has chest pain, shortness of breath,  and dyspnea as previously noted.  She has rare palpitations.  She denies  syncope or near syncope.  She denies claudication, cough, or wheezing.  She  denies dysuria or hematuria.  She does have increasing urinary frequency.  She denies weakness,  numbness, or depression.  She denies joint swelling or  deformity except for her right leg where she has arthralgias.  She denies  nausea, vomiting, diarrhea, or constipation.  She does have occasional  reflux symptoms which are not the same as the symptoms that brought her to  the hospital today.  She denies polyuria or polydipsia.  The rest of her  review of systems is negative.   PHYSICAL EXAMINATION:  GENERAL:  She was a pleasant well-appearing middle-  aged obese woman in no distress.  VITAL SIGNS:  Blood pressure was 144/90, the pulse 100 and regular,  respirations were 20, temperature was 98.  HEENT:  Normocephalic and atraumatic.  Pupils equal and round.  Oropharynx  was moist.  Sclerae were anicteric.  NECK:  No jugular venous distention; there was no thyromegaly.  The trachea  was midline.  The carotids were 2+ and symmetric.  CARDIOVASCULAR:  Regular rate and rhythm with normal S1 and S2.  There was  not an S3 or S4 gallop appreciated though her heart sounds were somewhat  distant.  LUNGS:  Clear bilaterally without wheezes, rales, or rhonchi.  ABDOMEN:  Soft, nontender, nondistended; there was no organomegaly.  EXTREMITIES:  No cyanosis, clubbing, or edema; there were no rashes or  petechial  lesions.  NEUROLOGICAL:  She was alert and oriented x3 with cranial nerves II-XII  grossly intact.  The strength was 5/5 and symmetric.  SKIN:  No rashes or lesions.   DIAGNOSTIC STUDIES:  Chest x-ray demonstrated no acute disease.  EKG  demonstrates sinus tachycardia at a rate of 101 beats per minute.  There are  nonspecific ST-T wave abnormalities.   LABORATORY DATA:  Labs are pending at the time of this dictation.   IMPRESSION:  1. Chest pain with both typical as well as atypical features.  2. Obesity.  3. Degenerative joint disease.  4. Hypertension.   DISCUSSION:  We will plan to admit the patient to the hospital and obtain  serial EKG's and enzymes.  Because of her relative sinus tachycardia, we  will also check a D-dimer.  If positive then a CT of the chest will be  indicated.  With regard to the patient's chest pain and shortness of breath,  I have discussed treatment options with the patient including a 2-D echo and  Cardiolite stress testing versus proceeding with left and right heart  catheterization for conclusive documentation of whether the patient's chest  discomfort is due to coronary disease.  The risks, benefits, goals and  expectations of each (invasive versus noninvasive) route of evaluation has  been discussed with the patient and she would like to proceed with the  catheterization.  This will be scheduled as soon as possible.                                               Doylene Canning. Ladona Ridgel, M.D. [facility]    GWT/MEDQ  D:  12/12/2002  T:  12/12/2002  Job:  [id number]   cc:   Angelena Sole, M.D. [facility]   Charlies Constable, M.D. [facility]  [address]  [place]  [place] [address]

## 2011-03-26 NOTE — Op Note (Signed)
NAME:  [name], [name]                           ACCOUNT NO.:  [id number]   MEDICAL RECORD NO.:  [id number]                   PATIENT TYPE:  INP   LOCATION:  X001                                 FACILITY:  [facility]   PHYSICIAN:  Sandria Bales. Ezzard Standing, M.D.               DATE OF BIRTH:  May 07, 1946   DATE OF PROCEDURE:  09/16/2003  DATE OF DISCHARGE:                                 OPERATIVE REPORT   PREOPERATIVE DIAGNOSIS:  Morbid obesity with body mass index of 45.   POSTOPERATIVE DIAGNOSIS:  Morbid obesity with body mass index of 45.   OPERATION PERFORMED:  Laparoscopic Roux-en-Y gastrojejunostomy (antecolic,  antegastric), esophagogastroscopy.   SURGEON:  Sandria Bales. Ezzard Standing, M.D.   ASSISTANT:  Thornton Park. Daphine Deutscher, M.D.   Dr. Daphine Deutscher did the esophagogastroscopy and will dictate that portion of the  operation.   ANESTHESIA:  General endotracheal.   ESTIMATED BLOOD LOSS:  50mL.   DRAINS:  19 Blake drain, left upper quadrant.   INDICATIONS FOR PROCEDURE:  [name] is a 65 year old white female who has  been obese much of her adult life, has tried multiple diets without success.  Her height is 5 feet 4 inches. Her weight is 265 pounds.  Her BMI is  calculated at about 45.  She has been through our preoperative counseling  and evaluation and now comes for attempted laparoscopic Roux-en-Y  gastrojejunostomy.  Indications and potential complications were explained  to the patient.  The potential complications include but are not limited to  bleeding, infection, bowel leak, venous thrombosis, long term nutritional  consequences.   DESCRIPTION OF PROCEDURE:  The patient was taken to the operating room.  She  underwent a general endotracheal anesthetic.  She had 1g of Ancef at the  initiation of the procedure.  She had PAS stockings in place, had already  had subcu Lovenox.  She had Foley catheter in place, NG tube in place.  Her  abdomen was prepped with Betadine solution and sterilely  draped.   I started in the left upper quadrant with the VersaPort 12 mm and I put this  in under direct visualization going through the abdominal wall.  I then  insufflated the abdomen and did a general abdominal exploration.  The right  and left lobes of the abdomen were unremarkable.  The omentum she had was a  moderate amount of omentum but none was stuck down to her pelvis.  Her  stomach was unremarkable.  I then put in a right subcostal 12 mm port, a  right paramedian 12 mm port and a left paramedian 12 mm port and a left  lateral 5 mm port.   I then found the ligament of Treitz and counted off 40 cm to divide the  small bowel.  I divided this with a single firing of the Flight GIA stapler,  the 45.  I then took down the  mesentery using Harmonic scalpel.  I then  marked the jejunal limb with a Penrose drain.  I counted then 100 cm for the  jejunal limb and carried out my anastomosis between the divided biliary limb  and the jejunal limb.  First I used a holding suture of 2-0 silk suture and  I created an enterotomy of both loops of bowel used another white load of  the GIA 45 stapler and then I closed the enterotomy with two running 2-0  Vicryl sutures.  I closed a mesenteric defect with a running 2-0 silk  suture, got a little hematoma on the biliary limb of the anastomosis but  there was no bleeding.  I inspected the anastomosis, there was a leak and I  covered this with Tisseel.   I then turned my attention to the stomach, first went up and placed the  liver, the Nathanson retractor through a 5 mm port in the subxiphoid  location.  I placed this under the left lobe of the liver.  First I went up  and up the angle of His.  Then I went down and counted about 5 cm along the  lesser curvature from the gastroesophageal junction down immediately beyond  the first major vessel going into the stomach.  I then dissected off the  stomach and went into the retrogastric space.  I then used  six firings of  the blue load of the GIA stapler, first transverse across the stomach, then  the next trying to make and create a tube for the stomach.  There was some  bleeding along the edges of the stomach which was controlled with Harmonic  scalpel and I covered the ends of the stomach with Tisseel and I thought I  had a pouch which was maybe 5 cm in length and about 3 to 4 cm in width.  I  then brought up the loop of small bowel antecolic, antegastric and sewed up,  ran a posterior row of 2-0 Vicryl sutures.  I then made an enterotomy both  into the stomach opposite the Ewald tube and into the jejunum and used a  single firing of the blue 45 stapler to create a gastrojejunostomy.  I then  closed the enterotomy beginning with two running 2-0 Vicryl sutures.  I then  pushed the Ewald tube through the anastomosis, closed the made a second  layer anteriorly with a running 2-0 Vicryl suture and pulled the Ewald out.  Dr. Daphine Deutscher then went to the head of the bed and did upper endoscopy.  Dr.  Daphine Deutscher will dictate the endoscopy with highlights, including anastomosis at  46 and 47 cm, the gastroesophageal junction which appeared to be about 42  cm.  There was a question whether she had a small hiatal hernia.  There was  no bleeding.  I flooded the upper abdomen with saline so there was no  evidence of air leak.  He then withdrew the endoscope.  I will dictate this  portion of the operation.   I then covered the anterior stomach with Tisseel about 3mL, total of 5mL of  Tisseel and I placed a 19 Blake drain in the left upper quadrant.  Placed  this up under the left lobe of the liver over the anterior portion of the  gastroesophageal junction.  I then removed the liver retractor, removed all  of the ports.   I stapled the skin.  I infiltrated about 19mL of 1% Xylocaine as a local anesthetic.  The patient tolerated the procedure well.  The estimated blood  loss was 50 to .  Total operating  time was about three hours 20  minutes.                                               Sandria Bales. Ezzard Standing, M.D.    DHN/MEDQ  D:  09/16/2003  T:  09/16/2003  Job:  [id number]   cc:   Angelena Sole, M.D. [facility]   Charlies Constable, M.D.   Marlowe Kays, M.D.  [address]  [place]  [place] [address]  Fax: [phone]   Areatha Keas, M.D.  [address]  [address]  [place]  [place] [address]  Fax: [phone]

## 2011-03-26 NOTE — Telephone Encounter (Signed)
Faxed.   KP 

## 2011-03-26 NOTE — Op Note (Signed)
   NAME:  [name], [name]                           ACCOUNT NO.:  [id number]   MEDICAL RECORD NO.:  [id number]                   PATIENT TYPE:  INP   LOCATION:  0160                                 FACILITY:  [facility]   PHYSICIAN:  Thornton Park. Daphine Deutscher, M.D.             DATE OF BIRTH:  1946/05/28   DATE OF PROCEDURE:  09/16/2003  DATE OF DISCHARGE:                                 OPERATIVE REPORT   PREOPERATIVE DIAGNOSIS:  Morbid obesity with laparoscopic Roux-en-Y gastric  bypass in progress.   POSTOPERATIVE DIAGNOSIS:  Morbid obesity with laparoscopic Roux-en-Y gastric  bypass in progress with patent gastrojejunostomy.   ENDOSCOPIST:  Thornton Park. Daphine Deutscher, M.D.   ANESTHESIA:  General.   HISTORY:  [name] is a lady undergoing laparoscopic Roux-en-Y gastric  bypass.  When the procedure was completed, I went to the head of the table  and with out video endoscope, easily passed that into oropharynx and into  the proximal esophagus.  Under direct vision with a videoscope, watched as  it traversed the esophagus to the esophagogastric junction and then into the  pouch.  The pouch tended to measure about 4-5 cm from EG junction to the  anastomosis.  The staple lines were intact and there was no active bleeding.  We were able to traverse to the gastrojejunostomy but I did not go below  that.  I insufflated the pouch and the proximal small bowel.  Dr. Ezzard Standing  surveyed the anastomosis and the staple lines of the stomach and there was  no leak with this under saline.  This pouch was then decompressed and the  scope was withdrawn.   FINDINGS:  A patent gastrojejunostomy with no evidence of leak.                                               Thornton Park Daphine Deutscher, M.D.    MBM/MEDQ  D:  09/16/2003  T:  09/16/2003  Job:  [id number]   cc:   Sandria Bales. Ezzard Standing, M.D.  [address]  [place]  [place] [address]  Fax: [phone]

## 2011-03-26 NOTE — Telephone Encounter (Signed)
Last seen 12/29/10 and filled 03/03/11 please advised    KP

## 2011-04-29 ENCOUNTER — Telehealth: Payer: Self-pay

## 2011-04-29 ENCOUNTER — Other Ambulatory Visit: Payer: Self-pay

## 2011-04-29 MED ORDER — CYCLOBENZAPRINE HCL 10 MG PO TABS: 10.0000 mg | ORAL_TABLET | Freq: Three times a day (TID) | ORAL | Status: DC | PRN

## 2011-04-29 MED ORDER — CLONAZEPAM 0.5 MG PO TABS: 0.5000 mg | ORAL_TABLET | Freq: Two times a day (BID) | ORAL | Status: DC

## 2011-04-29 NOTE — Telephone Encounter (Addendum)
Last filled Clonazepam 03/26/11 , Lorazepam 03/26/11 and flexeril 03/11/11  last seen 12/29/10 please advise     KP

## 2011-04-29 NOTE — Telephone Encounter (Signed)
Called patient to discuss Lorazepam and she stated she takes it when she needs it. She stated she takes the Klonopin daily. She stated she would try to go without the Lorazepam and sees how she feels.      KP       Does she still need lorazepam?--- i was trying to get her off that and just use klonopin with prozac Copied from Dr.Lowne's previous note    KP

## 2011-04-29 NOTE — Telephone Encounter (Signed)
Does she still need lorazepam?--- i was trying to get her off that and just use klonopin with prozac.

## 2011-05-03 ENCOUNTER — Other Ambulatory Visit: Payer: Self-pay | Admitting: Family Medicine

## 2011-05-03 MED ORDER — CYCLOBENZAPRINE HCL 10 MG PO TABS: 10.0000 mg | ORAL_TABLET | Freq: Two times a day (BID) | ORAL | Status: DC | PRN

## 2011-05-03 NOTE — Telephone Encounter (Signed)
She normally get 60.[name] KitchenMarland Kitchenprescription faxed for 60 pills   Pt aware Rx sent to the pharmacy  KP

## 2011-05-03 NOTE — Telephone Encounter (Signed)
Refill for flexerill was called in last week - patient received 30 pills she said she usually gets 90 - [facility]

## 2011-05-17 ENCOUNTER — Other Ambulatory Visit: Payer: Self-pay | Admitting: Family Medicine

## 2011-05-17 MED ORDER — LORAZEPAM 0.5 MG PO TABS: 0.5000 mg | ORAL_TABLET | Freq: Three times a day (TID) | ORAL | Status: DC | PRN

## 2011-05-17 NOTE — Telephone Encounter (Signed)
Last ov 05/2010 last filled 5/18

## 2011-06-25 ENCOUNTER — Encounter: Payer: Self-pay | Admitting: Family Medicine

## 2011-06-25 ENCOUNTER — Ambulatory Visit (INDEPENDENT_AMBULATORY_CARE_PROVIDER_SITE_OTHER): Payer: BC Managed Care – PPO | Admitting: Family Medicine

## 2011-06-25 VITALS — BP 150/92 | HR 97 | Temp 98.6°F | Wt 220.0 lb

## 2011-06-25 DIAGNOSIS — F411 Generalized anxiety disorder: Secondary | ICD-10-CM

## 2011-06-25 DIAGNOSIS — F341 Dysthymic disorder: Secondary | ICD-10-CM

## 2011-06-25 DIAGNOSIS — F419 Anxiety disorder, unspecified: Secondary | ICD-10-CM

## 2011-06-25 MED ORDER — CLONAZEPAM 0.5 MG PO TABS: 0.5000 mg | ORAL_TABLET | Freq: Two times a day (BID) | ORAL | Status: DC

## 2011-06-25 MED ORDER — VENLAFAXINE HCL ER 75 MG PO CP24: ORAL_CAPSULE | ORAL | Status: DC

## 2011-06-25 NOTE — Progress Notes (Signed)
  Subjective:    Patient ID: [name], female    DOB: 1946-06-02, 65 y.o.   MRN: [id number]  HPI Pt here with her husband that actually is not here about medication but is struggling with anxiety.  Pt thought she was going to have to go to hospital.  Pt is under a lot of stress.  Many family members with drug and alcohol problems and a daughter who is moving to [place] with their grand daughter.      Review of Systems as above   Objective:   Physical Exam  Constitutional: She is oriented to person, place, and time. She appears well-developed and well-nourished.  Neurological: She is alert and oriented to person, place, and time.  Psychiatric: Her speech is normal. Judgment normal. Her mood appears anxious. Cognition and memory are normal. She exhibits a depressed mood.       Pt crying easily Pt is not suicidal           Assessment & Plan:

## 2011-06-25 NOTE — Assessment & Plan Note (Signed)
Dec effexor xr 75 mg  3 po qd  Take klonopin bid Make appointment with psych F/u here 3-4 weeks if unable to get appointment with psych until after that

## 2011-06-25 NOTE — Patient Instructions (Signed)
Anxiety and Panic Attacks Your caregiver has informed you that you are having an anxiety or panic attack. There may be many forms of this. Most of the time these attacks come suddenly and without warning. They come at any time of day, including periods of sleep, and at any time of life. They may be strong and unexplained. Although panic attacks are very scary, they are physically harmless. Sometimes the cause of your anxiety is not known. Anxiety is a protective mechanism of the body in its fight or flight mechanism. Most of these perceived danger situations are actually nonphysical situations (such as anxiety over losing a job). CAUSES The causes of an anxiety or panic attack are many. Panic attacks may occur in otherwise healthy people given a certain set of circumstances. There may be a genetic cause for panic attacks. Some medications may also have anxiety as a side effect. SYMPTOMS Some of the most common feelings are:  Intense terror.  Dizziness, feeling faint.   Hot and cold flashes.   Fear of going crazy.   Feelings that nothing is real.   Sweating.   Shaking.   Chest pain or a fast heartbeat (palpitations).  Smothering, choking sensations.   Feelings of impending doom and that death is near.   Tingling of extremities, this may be from over breathing.   Altered reality (derealization).   Being detached from yourself (depersonalization).   Several symptoms can be present to make up anxiety or panic attacks. DIAGNOSIS The evaluation by your caregiver will depend on the type of symptoms you are experiencing. The diagnosis of anxiety or pain attack is made when no physical illness can be determined to be a cause of the symptoms. TREATMENT Treatment to prevent anxiety and panic attacks may include:  Avoidance of circumstances that cause anxiety.   Reassurance and relaxation.   Regular exercise.   Relaxation therapies, such as yoga.   Psychotherapy with a psychiatrist  or therapist.   Avoidance of caffeine, alcohol and illegal drugs.   Prescribed medication.  SEEK IMMEDIATE MEDICAL CARE IF:  You experience panic attack symptoms that are different than your usual symptoms.   You have any worsening or concerning symptoms.  Document Released: 10/25/2005 Document Re-Released: 04/14/2010 [employer] Patient Information 2011 [employer]. 

## 2011-07-02 ENCOUNTER — Other Ambulatory Visit: Payer: Self-pay | Admitting: Family Medicine

## 2011-07-02 MED ORDER — CYCLOBENZAPRINE HCL 10 MG PO TABS: 10.0000 mg | ORAL_TABLET | Freq: Two times a day (BID) | ORAL | Status: DC | PRN

## 2011-07-02 NOTE — Telephone Encounter (Signed)
rx refaxed    KP

## 2011-07-20 ENCOUNTER — Telehealth: Payer: Self-pay

## 2011-07-20 DIAGNOSIS — F419 Anxiety disorder, unspecified: Secondary | ICD-10-CM

## 2011-07-20 MED ORDER — CLONAZEPAM 0.5 MG PO TABS: 0.5000 mg | ORAL_TABLET | Freq: Three times a day (TID) | ORAL | Status: DC | PRN

## 2011-07-20 NOTE — Telephone Encounter (Signed)
The rx was for bid ----not tid ---I didn't want her taking more than that unless she came in to discuss it---she was also supposed to make an appointment with psych.   Increase to 0.5 mg tid #90 but she needs to make appointment with psych---we gave her a list.

## 2011-07-20 NOTE — Telephone Encounter (Signed)
Call from patient and she stated she needed to get an increase in her Klonopin. She stated 60 pills was not enough, she was given the Rx on 06/25/11 and is completely out, she stated she had been taking 2-3 pills on some days. Wants to know if she can get an increase in quantity.  Please advise   KP

## 2011-07-20 NOTE — Telephone Encounter (Signed)
Discussed with patient and she stated she has an appt scheduled with psych and she will have them send office notes after her visit      KP

## 2011-07-26 ENCOUNTER — Other Ambulatory Visit: Payer: Self-pay | Admitting: Family Medicine

## 2011-08-25 ENCOUNTER — Other Ambulatory Visit: Payer: Self-pay | Admitting: Family Medicine

## 2011-08-25 MED ORDER — CYCLOBENZAPRINE HCL 10 MG PO TABS: 10.0000 mg | ORAL_TABLET | Freq: Two times a day (BID) | ORAL | Status: DC | PRN

## 2011-08-25 NOTE — Telephone Encounter (Signed)
Last seen 06/25/11 and filled 07/02/11 please advise    KP

## 2011-09-22 ENCOUNTER — Other Ambulatory Visit: Payer: Self-pay | Admitting: Family Medicine

## 2011-09-22 NOTE — Telephone Encounter (Signed)
Last seen 06/25/11 and filled 08/25/11 # 60...[name] KitchenMarland Kitchen please advise    KP

## 2011-09-23 MED ORDER — CYCLOBENZAPRINE HCL 10 MG PO TABS: 10.0000 mg | ORAL_TABLET | Freq: Two times a day (BID) | ORAL | Status: DC | PRN

## 2011-10-14 ENCOUNTER — Other Ambulatory Visit: Payer: Self-pay | Admitting: Family Medicine

## 2011-10-14 NOTE — Telephone Encounter (Signed)
Last seen 06/25/11 and filled 07/20/11. Please advise    KP

## 2011-10-15 NOTE — Telephone Encounter (Signed)
Faxed.   KP 

## 2011-10-20 ENCOUNTER — Other Ambulatory Visit: Payer: Self-pay | Admitting: Family Medicine

## 2011-10-21 NOTE — Telephone Encounter (Signed)
Last seen 06/25/11 and filled 09/22/11 # 60. Please advise     KP

## 2011-11-11 ENCOUNTER — Other Ambulatory Visit: Payer: Self-pay | Admitting: Family Medicine

## 2011-11-12 MED ORDER — CLONAZEPAM 0.5 MG PO TABS: 0.5000 mg | ORAL_TABLET | Freq: Two times a day (BID) | ORAL | Status: DC | PRN

## 2011-11-12 NOTE — Telephone Encounter (Signed)
Last OV 06-25-11 last refill 10-14-11 #60 with no refills

## 2011-11-12 NOTE — Telephone Encounter (Signed)
Last seen 06/25/11 and filled 10/14/11 # 60 . Please advise     KP

## 2011-12-07 ENCOUNTER — Other Ambulatory Visit: Payer: Self-pay | Admitting: Family Medicine

## 2011-12-28 ENCOUNTER — Telehealth: Payer: Self-pay | Admitting: Family Medicine

## 2011-12-28 MED ORDER — CYCLOBENZAPRINE HCL 10 MG PO TABS: 10.0000 mg | ORAL_TABLET | Freq: Two times a day (BID) | ORAL | Status: DC | PRN

## 2011-12-28 NOTE — Telephone Encounter (Signed)
Ok to refill x 1  

## 2011-12-28 NOTE — Telephone Encounter (Signed)
Refill: Cyclobenzaprine Hcl 10mg  tab. Take 1 tablet twice daily as needed for muscle spasm. Qty 60. Last fill 12-03-11

## 2011-12-28 NOTE — Telephone Encounter (Signed)
Last seen 06/25/11 and filled 12/03/11 please advise    KP

## 2011-12-29 ENCOUNTER — Telehealth: Payer: Self-pay | Admitting: Family Medicine

## 2011-12-29 DIAGNOSIS — Z780 Asymptomatic menopausal state: Secondary | ICD-10-CM

## 2011-12-29 NOTE — Telephone Encounter (Signed)
Spoke with patient and she stated she needs to have her Vitamin D and Calcium levels check-- I checked records and her last BMD may have been in 2008.  Please advise    KP

## 2011-12-29 NOTE — Telephone Encounter (Signed)
[name] called the office to schedule a lab appointment. During her visit at [facility] they informed her that she had a fractured pelvic bone and T12 vertebra. Doctor wants to know the levels of her vitamin D and calcium. Best number to reach [name] is [phone].

## 2011-12-29 NOTE — Telephone Encounter (Signed)
Discussed with patient and future orders are in the system.    KP

## 2011-12-29 NOTE — Telephone Encounter (Signed)
Ok to check vita D and bmp  Dx postmenopausal Also bmd since not done

## 2011-12-30 ENCOUNTER — Other Ambulatory Visit (INDEPENDENT_AMBULATORY_CARE_PROVIDER_SITE_OTHER): Payer: BC Managed Care – PPO

## 2011-12-30 DIAGNOSIS — Z780 Asymptomatic menopausal state: Secondary | ICD-10-CM

## 2011-12-30 LAB — BASIC METABOLIC PANEL
CO2: 28 mEq/L (ref 19–32)
Chloride: 104 mEq/L (ref 96–112)
Potassium: 3.5 mEq/L (ref 3.5–5.1)

## 2011-12-31 LAB — VITAMIN D 25 HYDROXY (VIT D DEFICIENCY, FRACTURES): Vit D, 25-Hydroxy: 37 ng/mL (ref 30–89)

## 2012-02-07 ENCOUNTER — Other Ambulatory Visit: Payer: Self-pay | Admitting: Family Medicine

## 2012-02-07 MED ORDER — CLONAZEPAM 0.5 MG PO TABS: 0.5000 mg | ORAL_TABLET | Freq: Two times a day (BID) | ORAL | Status: DC | PRN

## 2012-02-07 NOTE — Telephone Encounter (Signed)
Refill x1  With 2 refills 

## 2012-02-07 NOTE — Telephone Encounter (Signed)
Last seen 06/25/11 and filled 11/12/11 3 60 with 2 refills please advise      KP

## 2012-02-07 NOTE — Telephone Encounter (Signed)
Refill for  Clonazepam 0.5MG  Tab  Last qty written 60 on 1.4.13 Last instruction Take 1 tablet (0.5 mg total) by mouth 2 (two) times daily as needed for anxiety.

## 2012-02-07 NOTE — Telephone Encounter (Signed)
Rx faxed.    KP 

## 2012-04-05 ENCOUNTER — Other Ambulatory Visit: Payer: Self-pay | Admitting: Family Medicine

## 2012-04-05 NOTE — Telephone Encounter (Signed)
She seems to be using more flexeril--- is everything ok?

## 2012-04-05 NOTE — Telephone Encounter (Signed)
She needs to have only one doctor write the meds.  If she is still sees Dr Noel Gerold he should fill it.

## 2012-04-05 NOTE — Telephone Encounter (Signed)
Ok to refill 

## 2012-04-05 NOTE — Telephone Encounter (Signed)
Patient returned phone call. She stated she seen by Dr Noel Gerold for fractured vertebrae an fractured pelvis. She stated that she fell in February and the Flexeril was also prescribed by Dr. Noel Gerold. Patient stated that she has poured her pills in the bottle with her husband because they both take the same medication. When asked if she has any symptoms in reference to medication, patient was unable to verify. She stated that  Dr. Noel Gerold has increased her to taking the medication 3 times per day, that may be the issue. She is obtaining refills from Dr Laury Axon and Dr. Noel Gerold. Patient was informed she would be contacted about the medication refill after Dr Hulan Saas review.

## 2012-04-05 NOTE — Telephone Encounter (Signed)
Call placed to patient at [phone], no answer. A voice message was left for patient to return phone call regarding medication refill

## 2012-04-05 NOTE — Telephone Encounter (Signed)
refill cyclobenzaprine HCL 10MG  Tab Qty 90 Take one tablet three times each day Last filled 4.18.13 Last ov 8.17.12

## 2012-04-06 MED ORDER — CYCLOBENZAPRINE HCL 10 MG PO TABS: 10.0000 mg | ORAL_TABLET | Freq: Two times a day (BID) | ORAL | Status: DC | PRN

## 2012-04-06 NOTE — Telephone Encounter (Signed)
Discussed with patient and she voiced understanding  She will call back to schedule and apt next week due to husbands surgery.     KP

## 2012-04-06 NOTE — Telephone Encounter (Signed)
msg left to call the office     KP 

## 2012-04-06 NOTE — Telephone Encounter (Signed)
Patient has not seen Dr.Cohen since March and she would have it filled the way it is normally filled, she only increased it at the time it was needed. Please advise     KP

## 2012-04-06 NOTE — Telephone Encounter (Signed)
Refill x1----  Will need to discuss at next ov

## 2012-04-18 ENCOUNTER — Telehealth: Payer: Self-pay | Admitting: Family Medicine

## 2012-04-18 MED ORDER — VENLAFAXINE HCL ER 75 MG PO CP24: ORAL_CAPSULE | ORAL | Status: DC

## 2012-04-18 NOTE — Telephone Encounter (Signed)
Faxed.   KP 

## 2012-04-18 NOTE — Telephone Encounter (Signed)
Refill: Venlafaxine hcl xr 75mg . Take three capsules by mouth daily. Qty 90. Last fill 03-07-12

## 2012-05-04 ENCOUNTER — Telehealth: Payer: Self-pay | Admitting: Family Medicine

## 2012-05-04 NOTE — Telephone Encounter (Signed)
Patient stated that her granddaughter (21) has a melanoma and a small clot on her leg, and she was given pain medications and now she is to the point to where she needs it, and she would know where they would need to go. She has been on the medication for about 6 months and she is willing to get help. The patient did not know whoelse to call, [facility] and [facility] have tried but she was given a number but the lady at the desk said he was not seeing anymore patients at this time. [facility] have [facility], patient has medicaid. Per Dr.Lowne patient needs to go to [facility] ED and let them know what's going on and be evaluated and by the beh [facility] team and evaluated for Detox. Patient voiced understanding and agreed to do so.     KP

## 2012-05-04 NOTE — Telephone Encounter (Signed)
Pt wants Dr. Laury Axon to call her back regarding "drug addiction." Pt would not give any specific details.

## 2012-05-04 NOTE — Telephone Encounter (Signed)
msg left to call the office     KP 

## 2012-05-12 ENCOUNTER — Telehealth: Payer: Self-pay | Admitting: Family Medicine

## 2012-05-12 NOTE — Telephone Encounter (Signed)
CLONAZEPAM 0.5 MG Tablet Last depensed: 04/05/12

## 2012-05-12 NOTE — Telephone Encounter (Signed)
Ok to refill 

## 2012-05-12 NOTE — Telephone Encounter (Signed)
Denied, see previous phone note regards drug addiction and needing to be  "detox"

## 2012-05-17 ENCOUNTER — Ambulatory Visit: Payer: BC Managed Care – PPO | Admitting: Family Medicine

## 2012-05-22 ENCOUNTER — Ambulatory Visit: Payer: BC Managed Care – PPO | Admitting: Family Medicine

## 2012-05-24 ENCOUNTER — Other Ambulatory Visit: Payer: Self-pay | Admitting: Family Medicine

## 2012-05-24 ENCOUNTER — Encounter: Payer: Self-pay | Admitting: Family Medicine

## 2012-05-24 ENCOUNTER — Ambulatory Visit (INDEPENDENT_AMBULATORY_CARE_PROVIDER_SITE_OTHER): Payer: BC Managed Care – PPO | Admitting: Family Medicine

## 2012-05-24 VITALS — BP 140/90 | HR 96 | Wt 224.0 lb

## 2012-05-24 DIAGNOSIS — R51 Headache: Secondary | ICD-10-CM

## 2012-05-24 DIAGNOSIS — R259 Unspecified abnormal involuntary movements: Secondary | ICD-10-CM

## 2012-05-24 DIAGNOSIS — F988 Other specified behavioral and emotional disorders with onset usually occurring in childhood and adolescence: Secondary | ICD-10-CM | POA: Insufficient documentation

## 2012-05-24 DIAGNOSIS — F419 Anxiety disorder, unspecified: Secondary | ICD-10-CM

## 2012-05-24 DIAGNOSIS — R252 Cramp and spasm: Secondary | ICD-10-CM

## 2012-05-24 DIAGNOSIS — F411 Generalized anxiety disorder: Secondary | ICD-10-CM

## 2012-05-24 HISTORY — DX: Other specified behavioral and emotional disorders with onset usually occurring in childhood and adolescence: F98.8

## 2012-05-24 MED ORDER — AMPHETAMINE-DEXTROAMPHET ER 20 MG PO CP24: 20.0000 mg | ORAL_CAPSULE | ORAL | Status: DC

## 2012-05-24 MED ORDER — SUMATRIPTAN SUCCINATE 50 MG PO TABS: ORAL_TABLET | ORAL | Status: DC

## 2012-05-24 MED ORDER — CLONAZEPAM 0.5 MG PO TABS: 0.5000 mg | ORAL_TABLET | Freq: Two times a day (BID) | ORAL | Status: DC | PRN

## 2012-05-24 MED ORDER — VENLAFAXINE HCL ER 75 MG PO CP24: ORAL_CAPSULE | ORAL | Status: DC

## 2012-05-24 NOTE — Assessment & Plan Note (Signed)
adderall xr 20  Mg  1 po qd  rto 1 month

## 2012-05-24 NOTE — Patient Instructions (Signed)
Attention Deficit Hyperactivity Disorder Attention deficit hyperactivity disorder (ADHD) is a problem with behavior issues based on the way the brain functions (neurobehavioral disorder). It is a common reason for behavior and academic problems in school. CAUSES  The cause of ADHD is unknown in most cases. It may run in families. It sometimes can be associated with learning disabilities and other behavioral problems. SYMPTOMS  There are 3 types of ADHD. The 3 types and some of the symptoms include:  Inattentive   Gets bored or distracted easily.   Loses or forgets things. Forgets to hand in homework.   Has trouble organizing or completing tasks.   Difficulty staying on task.   An inability to organize daily tasks and school work.   Leaving projects, chores, or homework unfinished.   Trouble paying attention or responding to details. Careless mistakes.   Difficulty following directions. Often seems like is not listening.   Dislikes activities that require sustained attention (like chores or homework).   Hyperactive-impulsive   Feels like it is impossible to sit still or stay in a seat. Fidgeting with hands and feet.   Trouble waiting turn.   Talking too much or out of turn. Interruptive.   Speaks or acts impulsively.   Aggressive, disruptive behavior.   Constantly busy or on the go, noisy.   Combined   Has symptoms of both of the above.  Often children with ADHD feel discouraged about themselves and with school. They often perform well below their abilities in school. These symptoms can cause problems in home, school, and in relationships with peers. As children get older, the excess motor activities can calm down, but the problems with paying attention and staying organized persist. Most children do not outgrow ADHD but with good treatment can learn to cope with the symptoms. DIAGNOSIS  When ADHD is suspected, the diagnosis should be made by professionals trained in  ADHD.  Diagnosis will include:  Ruling out other reasons for the child's behavior.   The caregivers will check with the child's school and check their medical records.   They will talk to teachers and parents.   Behavior rating scales for the child will be filled out by those dealing with the child on a daily basis.  A diagnosis is made only after all information has been considered. TREATMENT  Treatment usually includes behavioral treatment often along with medicines. It may include stimulant medicines. The stimulant medicines decrease impulsivity and hyperactivity and increase attention. Other medicines used include antidepressants and certain blood pressure medicines. Most experts agree that treatment for ADHD should address all aspects of the child's functioning. Treatment should not be limited to the use of medicines alone. Treatment should include structured classroom management. The parents must receive education to address rewarding good behavior, discipline, and limit-setting. Tutoring or behavioral therapy or both should be available for the child. If untreated, the disorder can have long-term serious effects into adolescence and adulthood. HOME CARE INSTRUCTIONS   Often with ADHD there is a lot of frustration among the family in dealing with the illness. There is often blame and anger that is not warranted. This is a life long illness. There is no way to prevent ADHD. In many cases, because the problem affects the family as a whole, the entire family may need help. A therapist can help the family find better ways to handle the disruptive behaviors and promote change. If the child is young, most of the therapist's work is with the parents. Parents will   learn techniques for coping with and improving their child's behavior. Sometimes only the child with the ADHD needs counseling. Your caregivers can help you make these decisions.   Children with ADHD may need help in organizing. Some  helpful tips include:   Keep routines the same every day from wake-up time to bedtime. Schedule everything. This includes homework and playtime. This should include outdoor and indoor recreation. Keep the schedule on the refrigerator or a bulletin board where it is frequently seen. Mark schedule changes as far in advance as possible.   Have a place for everything and keep everything in its place. This includes clothing, backpacks, and school supplies.   Encourage writing down assignments and bringing home needed books.   Offer your child a well-balanced diet. Breakfast is especially important for school performance. Children should avoid drinks with caffeine including:   Soft drinks.   Coffee.   Tea.   However, some older children (adolescents) may find these drinks helpful in improving their attention.   Children with ADHD need consistent rules that they can understand and follow. If rules are followed, give small rewards. Children with ADHD often receive, and expect, criticism. Look for good behavior and praise it. Set realistic goals. Give clear instructions. Look for activities that can foster success and self-esteem. Make time for pleasant activities with your child. Give lots of affection.   Parents are their children's greatest advocates. Learn as much as possible about ADHD. This helps you become a stronger and better advocate for your child. It also helps you educate your child's teachers and instructors if they feel inadequate in these areas. Parent support groups are often helpful. A national group with local chapters is called CHADD (Children and Adults with Attention Deficit Hyperactivity Disorder).  PROGNOSIS  There is no cure for ADHD. Children with the disorder seldom outgrow it. Many find adaptive ways to accommodate the ADHD as they mature. SEEK MEDICAL CARE IF:  Your child has repeated muscle twitches, cough or speech outbursts.   Your child has sleep problems.   Your  child has a marked loss of appetite.   Your child develops depression.   Your child has new or worsening behavioral problems.   Your child develops dizziness.   Your child has a racing heart.   Your child has stomach pains.   Your child develops headaches.  Document Released: 10/15/2002 Document Revised: 10/14/2011 Document Reviewed: 05/27/2008 Exit[employer] Patient Information 2012 [employer]. 

## 2012-05-24 NOTE — Telephone Encounter (Signed)
Last seen 05/24/12 and filled 04/06/12 #60. Please advise   KP

## 2012-05-24 NOTE — Progress Notes (Signed)
  Subjective:    Patient ID: [name], female    DOB: [birth year], 66 y.o.   MRN: [id number]  HPI Pt here f/u effexor but she feels like she can't concentrate or finish tasks and she is forgetting things.  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   She is also c/o not having control of her L leg. It happens only on occasion but is occurring more often.  No other symptoms in any other limb.     Review of Systems   as above Objective:   Physical Exam  Constitutional: She is oriented to person, place, and time. She appears well-developed and well-nourished.  Eyes: EOM are normal. Pupils are equal, round, and reactive to light.  Neck: Normal range of motion. Neck supple.  Pulmonary/Chest: Effort normal and breath sounds normal.  Musculoskeletal: Normal range of motion. She exhibits no edema and no tenderness.  Neurological: She is oriented to person, place, and time. She has normal reflexes. She displays normal reflexes. No cranial nerve deficit. She exhibits normal muscle tone. Coordination normal.  Psychiatric: She has a normal mood and affect. Her behavior is normal. Judgment and thought content normal.       Add self assessment + for ADD          Assessment & Plan:

## 2012-05-24 NOTE — Assessment & Plan Note (Signed)
Refer to neuro at pt request Check labs

## 2012-06-07 ENCOUNTER — Telehealth: Payer: Self-pay | Admitting: *Deleted

## 2012-06-07 NOTE — Telephone Encounter (Signed)
Prior Auth approved 05-04-12 until 05-25-13, approval letter scan to chart.

## 2012-06-22 ENCOUNTER — Other Ambulatory Visit: Payer: Self-pay | Admitting: *Deleted

## 2012-06-22 DIAGNOSIS — F988 Other specified behavioral and emotional disorders with onset usually occurring in childhood and adolescence: Secondary | ICD-10-CM

## 2012-06-22 MED ORDER — AMPHETAMINE-DEXTROAMPHET ER 20 MG PO CP24: 20.0000 mg | ORAL_CAPSULE | ORAL | Status: DC

## 2012-06-22 NOTE — Telephone Encounter (Signed)
Pt aware Rx ready for pick up 

## 2012-07-18 ENCOUNTER — Ambulatory Visit: Payer: BC Managed Care – PPO | Admitting: Family Medicine

## 2012-07-21 ENCOUNTER — Other Ambulatory Visit: Payer: Self-pay | Admitting: Family Medicine

## 2012-07-21 ENCOUNTER — Ambulatory Visit: Payer: BC Managed Care – PPO | Admitting: Family Medicine

## 2012-07-21 ENCOUNTER — Telehealth: Payer: Self-pay | Admitting: Family Medicine

## 2012-07-21 DIAGNOSIS — F988 Other specified behavioral and emotional disorders with onset usually occurring in childhood and adolescence: Secondary | ICD-10-CM

## 2012-07-21 MED ORDER — AMPHETAMINE-DEXTROAMPHET ER 20 MG PO CP24: 20.0000 mg | ORAL_CAPSULE | ORAL | Status: DC

## 2012-07-21 NOTE — Telephone Encounter (Signed)
Pt would like rx for Adderall. Call [phone] when ready to pick up.

## 2012-07-21 NOTE — Telephone Encounter (Signed)
Pt wants someone to call her about picking up med adderall- she is town for the next few hours and would like to come by.

## 2012-07-21 NOTE — Telephone Encounter (Signed)
Assessment & Plan:    ADD (attention deficit disorder) - Loreen Freud, DO 05/24/2012 12:36 PM Signed  adderall xr 20 Mg 1 po qd  rto 1 month    The patient called and cancelled her apt for today but is requesting a refill. Please advise      KP

## 2012-07-21 NOTE — Telephone Encounter (Signed)
Pt cancelled her appointment today because she said someone told her she could--- pt needs to call herself

## 2012-07-21 NOTE — Telephone Encounter (Signed)
Spoke with patient and we discussed her appoint cancellation, she said it was a misunderstanding. I scheduled her for tues of next week and Dr.Lowne sad it was ok to give the patient 4 pills to make it until her apt.     KP

## 2012-07-21 NOTE — Telephone Encounter (Signed)
[name], [facility] 07/21/2012 8:56 AM Signed  Assessment & Plan:    ADD (attention deficit disorder) - Loreen Freud, DO 05/24/2012 12:36 PM Signed  adderall xr 20 Mg 1 po qd  rto 1 month  The patient called and cancelled her apt for today but is requesting a refill. Please advise KP

## 2012-07-25 ENCOUNTER — Ambulatory Visit (INDEPENDENT_AMBULATORY_CARE_PROVIDER_SITE_OTHER): Payer: BC Managed Care – PPO | Admitting: Family Medicine

## 2012-07-25 ENCOUNTER — Ambulatory Visit: Payer: BC Managed Care – PPO | Admitting: Family Medicine

## 2012-07-25 ENCOUNTER — Encounter: Payer: Self-pay | Admitting: Family Medicine

## 2012-07-25 VITALS — BP 138/80 | HR 104 | Temp 98.3°F | Wt 215.2 lb

## 2012-07-25 DIAGNOSIS — F988 Other specified behavioral and emotional disorders with onset usually occurring in childhood and adolescence: Secondary | ICD-10-CM

## 2012-07-25 MED ORDER — AMPHETAMINE-DEXTROAMPHET ER 30 MG PO CP24: 30.0000 mg | ORAL_CAPSULE | ORAL | Status: DC

## 2012-07-25 NOTE — Patient Instructions (Addendum)
Attention Deficit Hyperactivity Disorder Attention deficit hyperactivity disorder (ADHD) is a problem with behavior issues based on the way the brain functions (neurobehavioral disorder). It is a common reason for behavior and academic problems in school. CAUSES  The cause of ADHD is unknown in most cases. It may run in families. It sometimes can be associated with learning disabilities and other behavioral problems. SYMPTOMS  There are 3 types of ADHD. The 3 types and some of the symptoms include:  Inattentive   Gets bored or distracted easily.   Loses or forgets things. Forgets to hand in homework.   Has trouble organizing or completing tasks.   Difficulty staying on task.   An inability to organize daily tasks and school work.   Leaving projects, chores, or homework unfinished.   Trouble paying attention or responding to details. Careless mistakes.   Difficulty following directions. Often seems like is not listening.   Dislikes activities that require sustained attention (like chores or homework).   Hyperactive-impulsive   Feels like it is impossible to sit still or stay in a seat. Fidgeting with hands and feet.   Trouble waiting turn.   Talking too much or out of turn. Interruptive.   Speaks or acts impulsively.   Aggressive, disruptive behavior.   Constantly busy or on the go, noisy.   Combined   Has symptoms of both of the above.  Often children with ADHD feel discouraged about themselves and with school. They often perform well below their abilities in school. These symptoms can cause problems in home, school, and in relationships with peers. As children get older, the excess motor activities can calm down, but the problems with paying attention and staying organized persist. Most children do not outgrow ADHD but with good treatment can learn to cope with the symptoms. DIAGNOSIS  When ADHD is suspected, the diagnosis should be made by professionals trained in  ADHD.  Diagnosis will include:  Ruling out other reasons for the child's behavior.   The caregivers will check with the child's school and check their medical records.   They will talk to teachers and parents.   Behavior rating scales for the child will be filled out by those dealing with the child on a daily basis.  A diagnosis is made only after all information has been considered. TREATMENT  Treatment usually includes behavioral treatment often along with medicines. It may include stimulant medicines. The stimulant medicines decrease impulsivity and hyperactivity and increase attention. Other medicines used include antidepressants and certain blood pressure medicines. Most experts agree that treatment for ADHD should address all aspects of the child's functioning. Treatment should not be limited to the use of medicines alone. Treatment should include structured classroom management. The parents must receive education to address rewarding good behavior, discipline, and limit-setting. Tutoring or behavioral therapy or both should be available for the child. If untreated, the disorder can have long-term serious effects into adolescence and adulthood. HOME CARE INSTRUCTIONS   Often with ADHD there is a lot of frustration among the family in dealing with the illness. There is often blame and anger that is not warranted. This is a life long illness. There is no way to prevent ADHD. In many cases, because the problem affects the family as a whole, the entire family may need help. A therapist can help the family find better ways to handle the disruptive behaviors and promote change. If the child is young, most of the therapist's work is with the parents. Parents will   learn techniques for coping with and improving their child's behavior. Sometimes only the child with the ADHD needs counseling. Your caregivers can help you make these decisions.   Children with ADHD may need help in organizing. Some  helpful tips include:   Keep routines the same every day from wake-up time to bedtime. Schedule everything. This includes homework and playtime. This should include outdoor and indoor recreation. Keep the schedule on the refrigerator or a bulletin board where it is frequently seen. Mark schedule changes as far in advance as possible.   Have a place for everything and keep everything in its place. This includes clothing, backpacks, and school supplies.   Encourage writing down assignments and bringing home needed books.   Offer your child a well-balanced diet. Breakfast is especially important for school performance. Children should avoid drinks with caffeine including:   Soft drinks.   Coffee.   Tea.   However, some older children (adolescents) may find these drinks helpful in improving their attention.   Children with ADHD need consistent rules that they can understand and follow. If rules are followed, give small rewards. Children with ADHD often receive, and expect, criticism. Look for good behavior and praise it. Set realistic goals. Give clear instructions. Look for activities that can foster success and self-esteem. Make time for pleasant activities with your child. Give lots of affection.   Parents are their children's greatest advocates. Learn as much as possible about ADHD. This helps you become a stronger and better advocate for your child. It also helps you educate your child's teachers and instructors if they feel inadequate in these areas. Parent support groups are often helpful. A national group with local chapters is called CHADD (Children and Adults with Attention Deficit Hyperactivity Disorder).  PROGNOSIS  There is no cure for ADHD. Children with the disorder seldom outgrow it. Many find adaptive ways to accommodate the ADHD as they mature. SEEK MEDICAL CARE IF:  Your child has repeated muscle twitches, cough or speech outbursts.   Your child has sleep problems.   Your  child has a marked loss of appetite.   Your child develops depression.   Your child has new or worsening behavioral problems.   Your child develops dizziness.   Your child has a racing heart.   Your child has stomach pains.   Your child develops headaches.  Document Released: 10/15/2002 Document Revised: 10/14/2011 Document Reviewed: 05/27/2008 [employer] Patient Information 2012 [employer]. 

## 2012-07-25 NOTE — Progress Notes (Signed)
  Subjective:    Patient ID: [name], female    DOB: Apr 26, 1946, 66 y.o.   MRN: [id number]  HPI Pt here f/u ADD --- she feels like it wears off too quickly.  She gets up at 4am and it is not lasting all day.      Review of Systems As above    Objective:   Physical Exam  Constitutional: She is oriented to person, place, and time. She appears well-developed and well-nourished.  Neck: Normal range of motion.  Cardiovascular: Normal rate, regular rhythm and normal heart sounds.   Pulmonary/Chest: Effort normal and breath sounds normal.  Neurological: She is alert and oriented to person, place, and time.  Psychiatric: She has a normal mood and affect. Her behavior is normal. Judgment and thought content normal.          Assessment & Plan:

## 2012-07-25 NOTE — Assessment & Plan Note (Signed)
Inc adderall to xr30 mg  rto 6 months or sooner prn

## 2012-07-26 ENCOUNTER — Telehealth: Payer: Self-pay | Admitting: Family Medicine

## 2012-07-26 DIAGNOSIS — R51 Headache: Secondary | ICD-10-CM

## 2012-07-26 DIAGNOSIS — F419 Anxiety disorder, unspecified: Secondary | ICD-10-CM

## 2012-07-26 MED ORDER — CLONAZEPAM 0.5 MG PO TABS: 0.5000 mg | ORAL_TABLET | Freq: Two times a day (BID) | ORAL | Status: DC | PRN

## 2012-07-26 MED ORDER — SUMATRIPTAN SUCCINATE 50 MG PO TABS: ORAL_TABLET | ORAL | Status: DC

## 2012-07-26 MED ORDER — VENLAFAXINE HCL ER 75 MG PO CP24: ORAL_CAPSULE | ORAL | Status: DC

## 2012-07-26 MED ORDER — CYCLOBENZAPRINE HCL 10 MG PO TABS: 10.0000 mg | ORAL_TABLET | Freq: Two times a day (BID) | ORAL | Status: DC | PRN

## 2012-07-26 NOTE — Telephone Encounter (Signed)
Pt seen yesterday and she thought her other medications were going to be refilled also.  Is it ok to refill her Effexor, flexeril and klonopin?

## 2012-07-26 NOTE — Telephone Encounter (Signed)
Patient left vm on triage line stating she was just seen and we were supposed to call in all her medications to [facility]. She would like a call when this has been completed. Call back # [phone]

## 2012-07-26 NOTE — Telephone Encounter (Signed)
effexor for 6 months ---others x1

## 2012-07-26 NOTE — Telephone Encounter (Signed)
Rx faxed.    KP 

## 2012-08-03 ENCOUNTER — Ambulatory Visit: Payer: BC Managed Care – PPO | Admitting: Family Medicine

## 2012-08-07 ENCOUNTER — Encounter: Payer: Self-pay | Admitting: Family Medicine

## 2012-08-07 ENCOUNTER — Ambulatory Visit (INDEPENDENT_AMBULATORY_CARE_PROVIDER_SITE_OTHER): Payer: BC Managed Care – PPO | Admitting: Family Medicine

## 2012-08-07 VITALS — BP 130/80 | HR 91 | Temp 98.2°F | Wt 218.2 lb

## 2012-08-07 DIAGNOSIS — F988 Other specified behavioral and emotional disorders with onset usually occurring in childhood and adolescence: Secondary | ICD-10-CM

## 2012-08-07 DIAGNOSIS — R059 Cough, unspecified: Secondary | ICD-10-CM

## 2012-08-07 DIAGNOSIS — J40 Bronchitis, not specified as acute or chronic: Secondary | ICD-10-CM

## 2012-08-07 DIAGNOSIS — R05 Cough: Secondary | ICD-10-CM

## 2012-08-07 MED ORDER — GUAIFENESIN-CODEINE 100-10 MG/5ML PO SYRP: ORAL_SOLUTION | ORAL | Status: DC

## 2012-08-07 MED ORDER — AZITHROMYCIN 250 MG PO TABS: ORAL_TABLET | ORAL | Status: DC

## 2012-08-07 MED ORDER — AMPHETAMINE-DEXTROAMPHET ER 30 MG PO CP24: 30.0000 mg | ORAL_CAPSULE | ORAL | Status: DC

## 2012-08-07 MED ORDER — ALBUTEROL SULFATE (5 MG/ML) 0.5% IN NEBU
2.5000 mg | INHALATION_SOLUTION | Freq: Once | RESPIRATORY_TRACT | Status: AC
  Administered 2012-08-07: 2.5 mg via RESPIRATORY_TRACT

## 2012-08-07 NOTE — Progress Notes (Signed)
  Subjective:     [name] is a 66 y.o. female here for evaluation of a cough. Onset of symptoms was 4 days ago. Symptoms have been gradually worsening since that time. The cough is dry and is aggravated by exercise, infection and reclining position. Associated symptoms include: chest pain, postnasal drip and wheezing. Patient does not have a history of asthma. Patient does not have a history of environmental allergens. Patient has not traveled recently. Patient does not have a history of smoking. Patient has not had a previous chest x-ray. Patient has not had a PPD done.  She also c/o lump L side of neck that she noticed right after she was here last visit.   No pain.  The following portions of the patient's history were reviewed and updated as appropriate: allergies, current medications, past family history, past medical history, past social history, past surgical history and problem list.  Review of Systems Pertinent items are noted in HPI.    Objective:    Oxygen saturation 97% on room air BP 130/80  Pulse 91  Temp 98.2 F (36.8 C) (Oral)  Wt 218 lb 3.2 oz (98.975 kg)  SpO2 97% General appearance: alert, cooperative, appears stated age and no distress Ears: normal TM's and external ear canals both ears Nose: Nares normal. Septum midline. Mucosa normal. No drainage or sinus tenderness. Throat: lips, mucosa, and tongue normal; teeth and gums normal Neck: no adenopathy, supple, symmetrical, trachea midline and thyroid not enlarged, symmetric, no tenderness/mass/nodules Lungs: diminished breath sounds bilaterally Heart: S1, S2 normal Lymph nodes: Cervical, supraclavicular, and axillary nodes normal.    Assessment:    Acute Bronchitis    Plan:    Antibiotics per medication orders. Antitussives per medication orders. Avoid exposure to tobacco smoke and fumes. Call if shortness of breath worsens, blood in sputum, change in character of cough, development of fever or chills,  inability to maintain nutrition and hydration. Avoid exposure to tobacco smoke and fumes. Steroid inhaler as ordered.

## 2012-08-07 NOTE — Patient Instructions (Signed)

## 2012-08-25 ENCOUNTER — Telehealth: Payer: Self-pay | Admitting: Family Medicine

## 2012-08-25 MED ORDER — CYCLOBENZAPRINE HCL 10 MG PO TABS: 10.0000 mg | ORAL_TABLET | Freq: Two times a day (BID) | ORAL | Status: DC | PRN

## 2012-08-25 NOTE — Telephone Encounter (Signed)
Refill: Cyclobenzaprine 10mg  tablet. Take 1 tablet by mouth 2 times daily as needed for muscle spasms. Qty 60. Last fill 9.18.13

## 2012-09-04 ENCOUNTER — Ambulatory Visit (INDEPENDENT_AMBULATORY_CARE_PROVIDER_SITE_OTHER): Payer: BC Managed Care – PPO | Admitting: Family Medicine

## 2012-09-04 ENCOUNTER — Other Ambulatory Visit: Payer: Self-pay

## 2012-09-04 DIAGNOSIS — F988 Other specified behavioral and emotional disorders with onset usually occurring in childhood and adolescence: Secondary | ICD-10-CM

## 2012-09-04 MED ORDER — AMPHETAMINE-DEXTROAMPHET ER 30 MG PO CP24: ORAL_CAPSULE | ORAL | Status: DC

## 2012-09-04 NOTE — Telephone Encounter (Signed)
Pt states you increased Adderall 30 mg to 2 per day because of gastro bypass surgery and digestion different. Last OV and filled 08/07/12 #30 no refills   PLz advise   MW 

## 2012-09-04 NOTE — Progress Notes (Signed)
  Subjective:    Patient ID: [name], female    DOB: Mar 31, 1946, 66 y.o.   MRN: [id number]  HPI No ov    Review of Systems     Objective:   Physical Exam        Assessment & Plan:

## 2012-09-11 MED ORDER — AMPHETAMINE-DEXTROAMPHET ER 30 MG PO CP24: ORAL_CAPSULE | ORAL | Status: DC

## 2012-09-11 NOTE — Telephone Encounter (Signed)
Pt states you increased Adderall 30 mg to 2 per day because of gastro bypass surgery and digestion different. Last OV and filled 08/07/12 #30 no refills   PLz advise   MW

## 2012-09-11 NOTE — Telephone Encounter (Signed)
LMOVM advising pt Rx ready for pick up.    MW

## 2012-09-11 NOTE — Telephone Encounter (Signed)
Refill #60 

## 2012-09-25 ENCOUNTER — Other Ambulatory Visit: Payer: Self-pay | Admitting: Family Medicine

## 2012-09-25 ENCOUNTER — Encounter: Payer: Self-pay | Admitting: *Deleted

## 2012-09-25 MED ORDER — CYCLOBENZAPRINE HCL 10 MG PO TABS: 10.0000 mg | ORAL_TABLET | Freq: Two times a day (BID) | ORAL | Status: DC | PRN

## 2012-09-25 NOTE — Telephone Encounter (Signed)
Ok to refill 

## 2012-09-25 NOTE — Patient Instructions (Signed)
Patient presented to the office requesting replacement Rx for Adderall or some samples, stating that she "believes the housekeeper stole the other Rx". I advised the patient that we did not keep samples of controlled substances in the office and that  Dr. Laury Axon  advised that we cannot give another script and that she would have to wait until the next one is due to be filled. Patient verbalized understanding of this policy. Vidal Schwalbe, RN

## 2012-09-25 NOTE — Telephone Encounter (Signed)
refill  Cyclobenzaprine HCl (Tab) 10 MG Take 1 tablet (10 mg total) by mouth 2 (two) times daily as needed for muscle spasms. #60 last fill 10.18.13

## 2012-09-25 NOTE — Telephone Encounter (Signed)
OV 09/04/12 Last filled 07/26/12 # 60 no refills plz advise    MW

## 2012-09-25 NOTE — Telephone Encounter (Signed)
Please advise      KP 

## 2012-10-13 ENCOUNTER — Ambulatory Visit: Payer: BC Managed Care – PPO | Admitting: Internal Medicine

## 2012-10-16 ENCOUNTER — Telehealth: Payer: Self-pay | Admitting: *Deleted

## 2012-10-16 NOTE — Telephone Encounter (Signed)
Patients husband called wanting pt to be seen tomorrow for ankle swelling possibly related to adderal. He states "I looked it up on [employer] and it said to see your dr if this happens". Appt made tomorrow at 4pm with Dr. Laury Axon.

## 2012-10-17 ENCOUNTER — Ambulatory Visit (INDEPENDENT_AMBULATORY_CARE_PROVIDER_SITE_OTHER): Payer: BC Managed Care – PPO | Admitting: Family Medicine

## 2012-10-17 ENCOUNTER — Encounter: Payer: Self-pay | Admitting: Family Medicine

## 2012-10-17 VITALS — BP 148/90 | HR 95 | Temp 98.3°F | Wt 209.4 lb

## 2012-10-17 DIAGNOSIS — R609 Edema, unspecified: Secondary | ICD-10-CM

## 2012-10-17 MED ORDER — HYDROCHLOROTHIAZIDE 25 MG PO TABS: 25.0000 mg | ORAL_TABLET | Freq: Every day | ORAL | Status: DC

## 2012-10-17 NOTE — Progress Notes (Signed)
  Subjective:    [name] is a 66 y.o. female who presents for evaluation of edema in the left foot, the left lower leg and the right foot. The edema has been moderate. Onset of symptoms was 3 weeks ago, and patient reports symptoms have gradually worsened since that time. The edema is present in the afternoon, in the evening and at bedtime. The patient states the problem is new. The swelling has been aggravated by nothing. The swelling has been relieved by elevation of involved area. Associated factors include: nothing. Cardiac risk factors: advanced age (older than 33 for men, 12 for women), dyslipidemia, hypertension and sedentary lifestyle.  The following portions of the patient's history were reviewed and updated as appropriate: allergies, current medications, past family history, past medical history, past social history, past surgical history and problem list.  Review of Systems Pertinent items are noted in HPI.   Objective:    BP 148/90  Pulse 95  Temp 98.3 F (36.8 C) (Oral)  Wt 209 lb 6.4 oz (94.983 kg)  SpO2 96% General appearance: alert, cooperative, appears stated age and no distress Neck: no adenopathy, supple, symmetrical, trachea midline and thyroid not enlarged, symmetric, no tenderness/mass/nodules Lungs: clear to auscultation bilaterally Heart: S1, S2 normal Extremities: edema R foot and ankle and left leg, foot and ankle   Cardiographics ECG: not done  Imaging Chest x-ray: not indicated   Assessment:     Edema secondary to dependency.   Elevated bp Plan:    Recommendations: decrease sodium in the diet, elevate feet above the level of the heart whenever possible, increase physical activity and pt wanted to hold off on stockings. The patient was also instructed to call IMMEDIATELY (i.e., day or night) if any cardiopulmonary symptoms occur, especially chest pain, shortness of breath, dyspnea on exertion, paroxysmal nocturnal dyspnea, or orthopnea, and these  were explained. Follow up in 2 weeks and as needed.  hctz 25 mg 1 po qd

## 2012-10-17 NOTE — Patient Instructions (Addendum)
Edema Edema is an abnormal build-up of fluids in tissues. Because this is partly dependent on gravity (water flows to the lowest place), it is more common in the legs and thighs (lower extremities). It is also common in the looser tissues, like around the eyes. Painless swelling of the feet and ankles is common and increases as a person ages. It may affect both legs and may include the calves or even thighs. When squeezed, the fluid may move out of the affected area and may leave a dent for a few moments. CAUSES   Prolonged standing or sitting in one place for extended periods of time. Movement helps pump tissue fluid into the veins, and absence of movement prevents this, resulting in edema.  Varicose veins. The valves in the veins do not work as well as they should. This causes fluid to leak into the tissues.  Fluid and salt overload.  Injury, burn, or surgery to the leg, ankle, or foot, may damage veins and allow fluid to leak out.  Sunburn damages vessels. Leaky vessels allow fluid to go out into the sunburned tissues.  Allergies (from insect bites or stings, medications or chemicals) cause swelling by allowing vessels to become leaky.  Protein in the blood helps keep fluid in your vessels. Low protein, as in malnutrition, allows fluid to leak out.  Hormonal changes, including pregnancy and menstruation, cause fluid retention. This fluid may leak out of vessels and cause edema.  Medications that cause fluid retention. Examples are sex hormones, blood pressure medications, steroid treatment, or anti-depressants.  Some illnesses cause edema, especially heart failure, kidney disease, or liver disease.  Surgery that cuts veins or lymph nodes, such as surgery done for the heart or for breast cancer, may result in edema. DIAGNOSIS  Your caregiver is usually easily able to determine what is causing your swelling (edema) by simply asking what is wrong (getting a history) and examining you (doing  a physical). Sometimes x-rays, EKG (electrocardiogram or heart tracing), and blood work may be done to evaluate for underlying medical illness. TREATMENT  General treatment includes:  Leg elevation (or elevation of the affected body part).  Restriction of fluid intake.  Prevention of fluid overload.  Compression of the affected body part. Compression with elastic bandages or support stockings squeezes the tissues, preventing fluid from entering and forcing it back into the blood vessels.  Diuretics (also called water pills or fluid pills) pull fluid out of your body in the form of increased urination. These are effective in reducing the swelling, but can have side effects and must be used only under your caregiver's supervision. Diuretics are appropriate only for some types of edema. The specific treatment can be directed at any underlying causes discovered. Heart, liver, or kidney disease should be treated appropriately. HOME CARE INSTRUCTIONS   Elevate the legs (or affected body part) above the level of the heart, while lying down.  Avoid sitting or standing still for prolonged periods of time.  Avoid putting anything directly under the knees when lying down, and do not wear constricting clothing or garters on the upper legs.  Exercising the legs causes the fluid to work back into the veins and lymphatic channels. This may help the swelling go down.  The pressure applied by elastic bandages or support stockings can help reduce ankle swelling.  A low-salt diet may help reduce fluid retention and decrease the ankle swelling.  Take any medications exactly as prescribed. SEEK MEDICAL CARE IF:  Your edema is   not responding to recommended treatments. SEEK IMMEDIATE MEDICAL CARE IF:   You develop shortness of breath or chest pain.  You cannot breathe when you lay down; or if, while lying down, you have to get up and go to the window to get your breath.  You are having increasing  swelling without relief from treatment.  You develop a fever over 102 F (38.9 C).  You develop pain or redness in the areas that are swollen.  Tell your caregiver right away if you have gained 3 lb/1.4 kg in 1 day or 5 lb/2.3 kg in a week. MAKE SURE YOU:   Understand these instructions.  Will watch your condition.  Will get help right away if you are not doing well or get worse. Document Released: 10/25/2005 Document Revised: 04/25/2012 Document Reviewed: 06/12/2008 Exit[employer] Patient Information 2013 [employer].  

## 2012-10-18 LAB — PHOSPHORUS: Phosphorus: 3.6 mg/dL (ref 2.3–4.6)

## 2012-10-18 LAB — HEPATIC FUNCTION PANEL
ALT: 9 U/L (ref 0–35)
Bilirubin, Direct: 0.1 mg/dL (ref 0.0–0.3)
Total Protein: 7.5 g/dL (ref 6.0–8.3)

## 2012-10-18 LAB — BASIC METABOLIC PANEL
Chloride: 99 mEq/L (ref 96–112)
Creatinine, Ser: 0.7 mg/dL (ref 0.4–1.2)
GFR: 87.88 mL/min (ref >60.00)
Potassium: 3.8 mEq/L (ref 3.5–5.1)

## 2012-10-25 ENCOUNTER — Telehealth: Payer: Self-pay | Admitting: Family Medicine

## 2012-10-25 DIAGNOSIS — F988 Other specified behavioral and emotional disorders with onset usually occurring in childhood and adolescence: Secondary | ICD-10-CM

## 2012-10-25 MED ORDER — AMPHETAMINE-DEXTROAMPHET ER 30 MG PO CP24: ORAL_CAPSULE | ORAL | Status: DC

## 2012-10-25 NOTE — Telephone Encounter (Signed)
Rx printed and Dr.Lowne has been made aware that the apt has been cancelled.      KP

## 2012-10-25 NOTE — Telephone Encounter (Signed)
pt called in to cancel follow up appt as she is doing well, also wants rx refill for her adderall before dr.lowne goes out of town cb# [phone]

## 2012-10-26 ENCOUNTER — Ambulatory Visit: Payer: BC Managed Care – PPO | Admitting: Family Medicine

## 2012-11-17 ENCOUNTER — Telehealth: Payer: Self-pay | Admitting: Family Medicine

## 2012-11-17 DIAGNOSIS — R51 Headache: Secondary | ICD-10-CM

## 2012-11-17 MED ORDER — SUMATRIPTAN SUCCINATE 50 MG PO TABS: ORAL_TABLET | ORAL | Status: DC

## 2012-11-17 NOTE — Telephone Encounter (Signed)
Refill: Sumatriptan 50 mg tablet. 1 po at first sign of headache, may repeat in 2 h x1. Qty 10. Last fill 07-26-12

## 2012-11-23 ENCOUNTER — Other Ambulatory Visit: Payer: Self-pay | Admitting: *Deleted

## 2012-11-23 DIAGNOSIS — F988 Other specified behavioral and emotional disorders with onset usually occurring in childhood and adolescence: Secondary | ICD-10-CM

## 2012-11-23 MED ORDER — AMPHETAMINE-DEXTROAMPHET ER 30 MG PO CP24: ORAL_CAPSULE | ORAL | Status: DC

## 2012-11-23 NOTE — Telephone Encounter (Signed)
Ok to refill 

## 2012-11-23 NOTE — Telephone Encounter (Signed)
msg left advising Rx ready for pick up.     KP 

## 2012-11-23 NOTE — Telephone Encounter (Signed)
Pt left msg requesting a refill for adderall xr 30mg . Last OV 12.10.13 Last filled 12.18.13

## 2012-11-25 ENCOUNTER — Other Ambulatory Visit: Payer: Self-pay | Admitting: Family Medicine

## 2012-11-28 NOTE — Telephone Encounter (Signed)
Last seen 10/17/12 and filled 09/25/12 #60 with 1 refill. Please advise     KP

## 2012-12-15 ENCOUNTER — Ambulatory Visit (INDEPENDENT_AMBULATORY_CARE_PROVIDER_SITE_OTHER): Payer: BC Managed Care – PPO | Admitting: Family Medicine

## 2012-12-15 ENCOUNTER — Ambulatory Visit (HOSPITAL_BASED_OUTPATIENT_CLINIC_OR_DEPARTMENT_OTHER)
Admission: RE | Admit: 2012-12-15 | Discharge: 2012-12-15 | Disposition: A | Discharge status: Home / Self Care | Payer: BC Managed Care – PPO | Source: Ambulatory Visit | Attending: Family Medicine | Admitting: Family Medicine

## 2012-12-15 ENCOUNTER — Other Ambulatory Visit: Payer: Self-pay | Admitting: Family Medicine

## 2012-12-15 ENCOUNTER — Encounter: Payer: Self-pay | Admitting: Family Medicine

## 2012-12-15 VITALS — BP 128/70 | HR 95 | Temp 98.3°F | Wt 215.8 lb

## 2012-12-15 DIAGNOSIS — M79605 Pain in left leg: Secondary | ICD-10-CM

## 2012-12-15 DIAGNOSIS — M79609 Pain in unspecified limb: Secondary | ICD-10-CM

## 2012-12-15 DIAGNOSIS — R609 Edema, unspecified: Secondary | ICD-10-CM

## 2012-12-15 DIAGNOSIS — M7989 Other specified soft tissue disorders: Secondary | ICD-10-CM | POA: Insufficient documentation

## 2012-12-15 NOTE — Patient Instructions (Addendum)
Edema Edema is an abnormal build-up of fluids in tissues. Because this is partly dependent on gravity (water flows to the lowest place), it is more common in the legs and thighs (lower extremities). It is also common in the looser tissues, like around the eyes. Painless swelling of the feet and ankles is common and increases as a person ages. It may affect both legs and may include the calves or even thighs. When squeezed, the fluid may move out of the affected area and may leave a dent for a few moments. CAUSES   Prolonged standing or sitting in one place for extended periods of time. Movement helps pump tissue fluid into the veins, and absence of movement prevents this, resulting in edema.  Varicose veins. The valves in the veins do not work as well as they should. This causes fluid to leak into the tissues.  Fluid and salt overload.  Injury, burn, or surgery to the leg, ankle, or foot, may damage veins and allow fluid to leak out.  Sunburn damages vessels. Leaky vessels allow fluid to go out into the sunburned tissues.  Allergies (from insect bites or stings, medications or chemicals) cause swelling by allowing vessels to become leaky.  Protein in the blood helps keep fluid in your vessels. Low protein, as in malnutrition, allows fluid to leak out.  Hormonal changes, including pregnancy and menstruation, cause fluid retention. This fluid may leak out of vessels and cause edema.  Medications that cause fluid retention. Examples are sex hormones, blood pressure medications, steroid treatment, or anti-depressants.  Some illnesses cause edema, especially heart failure, kidney disease, or liver disease.  Surgery that cuts veins or lymph nodes, such as surgery done for the heart or for breast cancer, may result in edema. DIAGNOSIS  Your caregiver is usually easily able to determine what is causing your swelling (edema) by simply asking what is wrong (getting a history) and examining you (doing  a physical). Sometimes x-rays, EKG (electrocardiogram or heart tracing), and blood work may be done to evaluate for underlying medical illness. TREATMENT  General treatment includes:  Leg elevation (or elevation of the affected body part).  Restriction of fluid intake.  Prevention of fluid overload.  Compression of the affected body part. Compression with elastic bandages or support stockings squeezes the tissues, preventing fluid from entering and forcing it back into the blood vessels.  Diuretics (also called water pills or fluid pills) pull fluid out of your body in the form of increased urination. These are effective in reducing the swelling, but can have side effects and must be used only under your caregiver's supervision. Diuretics are appropriate only for some types of edema. The specific treatment can be directed at any underlying causes discovered. Heart, liver, or kidney disease should be treated appropriately. HOME CARE INSTRUCTIONS   Elevate the legs (or affected body part) above the level of the heart, while lying down.  Avoid sitting or standing still for prolonged periods of time.  Avoid putting anything directly under the knees when lying down, and do not wear constricting clothing or garters on the upper legs.  Exercising the legs causes the fluid to work back into the veins and lymphatic channels. This may help the swelling go down.  The pressure applied by elastic bandages or support stockings can help reduce ankle swelling.  A low-salt diet may help reduce fluid retention and decrease the ankle swelling.  Take any medications exactly as prescribed. SEEK MEDICAL CARE IF:  Your edema is   not responding to recommended treatments. SEEK IMMEDIATE MEDICAL CARE IF:   You develop shortness of breath or chest pain.  You cannot breathe when you lay down; or if, while lying down, you have to get up and go to the window to get your breath.  You are having increasing  swelling without relief from treatment.  You develop a fever over 102 F (38.9 C).  You develop pain or redness in the areas that are swollen.  Tell your caregiver right away if you have gained 3 lb/1.4 kg in 1 day or 5 lb/2.3 kg in a week. MAKE SURE YOU:   Understand these instructions.  Will watch your condition.  Will get help right away if you are not doing well or get worse. Document Released: 10/25/2005 Document Revised: 04/25/2012 Document Reviewed: 06/12/2008 Exit[employer] Patient Information 2013 [employer].  

## 2012-12-16 DIAGNOSIS — M79604 Pain in right leg: Secondary | ICD-10-CM | POA: Insufficient documentation

## 2012-12-16 NOTE — Progress Notes (Signed)
  Subjective:    [name] is a 67 y.o. female who presents for evaluation of edema in both ankles and feet. The edema has been moderate. Onset of symptoms was several weeks ago, and patient reports symptoms have worsened and then improved when she stopped the diuretic but she is still c/o edema since that time. The edema is present all day but worse in pm. The patient states the problem is new since last visit.  The swelling has been aggravated by dependency of involved area. The swelling has been relieved by elevation of involved area. Associated factors include: shortness of breath and weight gain. Cardiac risk factors: obesity (BMI >= 30 kg/m2) and sedentary lifestyle.  The following portions of the patient's history were reviewed and updated as appropriate: allergies, current medications, past family history, past medical history, past social history, past surgical history and problem list.  Review of Systems Pertinent items are noted in HPI.   Objective:    BP 128/70  Pulse 95  Temp(Src) 98.3 F (36.8 C) (Oral)  Wt 215 lb 12.8 oz (97.886 kg)  BMI 35.91 kg/m2  SpO2 97% General appearance: alert, cooperative, appears stated age and no distress Lungs: clear to auscultation bilaterally Heart: S1, S2 normal Extremities: edema + pitting edema b/l with some calf pain b/l   Cardiographics ECG: not done  Imaging Chest x-ray: not indicated   Assessment:     Edema---- with calf pain.    Plan:    Recommendations: decrease sodium in the diet, elevate feet above the level of the heart whenever possible, increase physical activity, use of compression stockings, weight loss and dopplers to be done today.  2d echo ordered The patient was also instructed to call IMMEDIATELY (i.e., day or night) if any cardiopulmonary symptoms occur, especially chest pain, shortness of breath, dyspnea on exertion, paroxysmal nocturnal dyspnea, or orthopnea, and these were explained. Follow up in 2 weeks  and as needed.

## 2012-12-16 NOTE — Assessment & Plan Note (Signed)
B/l calf pain and swelling Dopplers today Compression stockings Elevate legs Diuretics did not help 2d echo

## 2012-12-18 ENCOUNTER — Telehealth: Payer: Self-pay | Admitting: Family Medicine

## 2012-12-18 NOTE — Telephone Encounter (Signed)
[facility] Triage Call Report Triage Record Num: [id number] Operator: Frederico Hamman Patient Name: [name] Call Date & Time: 12/15/2012 5:48:23PM Patient Phone: [phone] PCP: Lelon Perla Patient Gender: Female PCP Fax : [phone] Patient DOB: Oct 17, 1946 Practice Name: [facility] Reason for Call: Caller: Shonta/Patient; PCP: Lelon Perla.; CB#: [phone]; Call regarding Results of ultrasound of legs; Harlo states she was seen in office at 1445. States she was sent to have an Ultrasound of her legs. Requesting report of Ultrasound done today. Worksheet from [name] states no DVT seen. Dr. Milinda Antis notified- advised to tell pt results. Pt informed no DVT seen. No further questions. Protocol(s) Used: PCP Calls, No Triage (Adult) Recommended Outcome per Protocol: Call Provider within 24 Hours Reason for Outcome: Caller requesting lab results Care Advice: ~

## 2012-12-18 NOTE — Telephone Encounter (Signed)
Call complete       KP

## 2012-12-20 ENCOUNTER — Telehealth: Payer: Self-pay | Admitting: Family Medicine

## 2012-12-20 NOTE — Telephone Encounter (Signed)
Patient is scheduled for echo at [facility] on 12/27/12, and has been approved by her insurance company.  I left voicemail for patient with above information.

## 2012-12-20 NOTE — Telephone Encounter (Signed)
[name] this patient is wandering about a heart test and if it is being scheduled can you call her I couldn't tell by looking - I saw a echo order but past that NO IDEA- thanks

## 2012-12-27 ENCOUNTER — Ambulatory Visit (HOSPITAL_BASED_OUTPATIENT_CLINIC_OR_DEPARTMENT_OTHER)
Admission: RE | Admit: 2012-12-27 | Discharge: 2012-12-27 | Disposition: A | Discharge status: Home / Self Care | Payer: BC Managed Care – PPO | Source: Ambulatory Visit | Attending: Family Medicine | Admitting: Family Medicine

## 2012-12-27 DIAGNOSIS — R609 Edema, unspecified: Secondary | ICD-10-CM | POA: Insufficient documentation

## 2012-12-27 DIAGNOSIS — I519 Heart disease, unspecified: Secondary | ICD-10-CM

## 2012-12-27 DIAGNOSIS — I369 Nonrheumatic tricuspid valve disorder, unspecified: Secondary | ICD-10-CM | POA: Insufficient documentation

## 2012-12-27 NOTE — Progress Notes (Signed)
  Echocardiogram 2D Echocardiogram has been performed.  [place]ian Co 12/27/2012, 12:00 PM

## 2012-12-28 ENCOUNTER — Telehealth: Payer: Self-pay | Admitting: *Deleted

## 2012-12-28 NOTE — Telephone Encounter (Signed)
Marge Duncans C - 12/27/12 ','<More Detail >>       Lelon Perla, DO       Sent: Thu December 28, 2012 10:44 AM   To: Arnette Norris, CMA                 Result Note    Mild diastolic dysfunction----otherwise normal   Can refer to cardio if symptoms no better

## 2012-12-28 NOTE — Telephone Encounter (Signed)
Pt requesting results of Echo. Please advise. 

## 2012-12-28 NOTE — Telephone Encounter (Signed)
See echo 

## 2012-12-28 NOTE — Telephone Encounter (Signed)
Discussed with patient and she voiced understanding.      KP 

## 2013-01-01 ENCOUNTER — Ambulatory Visit (INDEPENDENT_AMBULATORY_CARE_PROVIDER_SITE_OTHER): Payer: BC Managed Care – PPO | Admitting: Family Medicine

## 2013-01-01 ENCOUNTER — Encounter: Payer: Self-pay | Admitting: Family Medicine

## 2013-01-01 VITALS — BP 126/80 | HR 96 | Temp 98.4°F | Wt 210.0 lb

## 2013-01-01 DIAGNOSIS — I519 Heart disease, unspecified: Secondary | ICD-10-CM | POA: Insufficient documentation

## 2013-01-01 DIAGNOSIS — F988 Other specified behavioral and emotional disorders with onset usually occurring in childhood and adolescence: Secondary | ICD-10-CM

## 2013-01-01 MED ORDER — AMPHETAMINE-DEXTROAMPHET ER 30 MG PO CP24: ORAL_CAPSULE | ORAL | Status: DC

## 2013-01-01 NOTE — Progress Notes (Signed)
  Subjective:    Patient ID: [name], female    DOB: Aug 15, 1946, 67 y.o.   MRN: [id number]  HPI Pt here with her husband to review echo and discuss ADD meds.  Her and her husband state she has had no energy,  Has not been able to concentrate since being off meds.  Her edema has resolved but she states her pain meds were changed by Rhe[facility] and that seemed to make the bigger difference.     Review of Systems    as above Objective:   Physical Exam   BP 126/80  Pulse 96  Temp(Src) 98.4 F (36.9 C) (Oral)  Wt 210 lb (95.255 kg)  BMI 34.95 kg/m2  SpO2 99% General appearance: alert, cooperative, appears stated age and no distress Neck: no adenopathy, no carotid bruit, no JVD, supple, symmetrical, trachea midline and thyroid not enlarged, symmetric, no tenderness/mass/nodules Lungs: clear to auscultation bilaterally Heart: S1, S2 normal      Assessment & Plan:

## 2013-01-01 NOTE — Patient Instructions (Signed)
Attention Deficit Hyperactivity Disorder Attention deficit hyperactivity disorder (ADHD) is a problem with behavior issues based on the way the brain functions (neurobehavioral disorder). It is a common reason for behavior and academic problems in school. CAUSES  The cause of ADHD is unknown in most cases. It may run in families. It sometimes can be associated with learning disabilities and other behavioral problems. SYMPTOMS  There are 3 types of ADHD. The 3 types and some of the symptoms include:  Inattentive  Gets bored or distracted easily.  Loses or forgets things. Forgets to hand in homework.  Has trouble organizing or completing tasks.  Difficulty staying on task.  An inability to organize daily tasks and school work.  Leaving projects, chores, or homework unfinished.  Trouble paying attention or responding to details. Careless mistakes.  Difficulty following directions. Often seems like is not listening.  Dislikes activities that require sustained attention (like chores or homework).  Hyperactive-impulsive  Feels like it is impossible to sit still or stay in a seat. Fidgeting with hands and feet.  Trouble waiting turn.  Talking too much or out of turn. Interruptive.  Speaks or acts impulsively.  Aggressive, disruptive behavior.  Constantly busy or on the go, noisy.  Combined  Has symptoms of both of the above. Often children with ADHD feel discouraged about themselves and with school. They often perform well below their abilities in school. These symptoms can cause problems in home, school, and in relationships with peers. As children get older, the excess motor activities can calm down, but the problems with paying attention and staying organized persist. Most children do not outgrow ADHD but with good treatment can learn to cope with the symptoms. DIAGNOSIS  When ADHD is suspected, the diagnosis should be made by professionals trained in ADHD.  Diagnosis will  include:  Ruling out other reasons for the child's behavior.  The caregivers will check with the child's school and check their medical records.  They will talk to teachers and parents.  Behavior rating scales for the child will be filled out by those dealing with the child on a daily basis. A diagnosis is made only after all information has been considered. TREATMENT  Treatment usually includes behavioral treatment often along with medicines. It may include stimulant medicines. The stimulant medicines decrease impulsivity and hyperactivity and increase attention. Other medicines used include antidepressants and certain blood pressure medicines. Most experts agree that treatment for ADHD should address all aspects of the child's functioning. Treatment should not be limited to the use of medicines alone. Treatment should include structured classroom management. The parents must receive education to address rewarding good behavior, discipline, and limit-setting. Tutoring or behavioral therapy or both should be available for the child. If untreated, the disorder can have long-term serious effects into adolescence and adulthood. HOME CARE INSTRUCTIONS   Often with ADHD there is a lot of frustration among the family in dealing with the illness. There is often blame and anger that is not warranted. This is a life long illness. There is no way to prevent ADHD. In many cases, because the problem affects the family as a whole, the entire family may need help. A therapist can help the family find better ways to handle the disruptive behaviors and promote change. If the child is young, most of the therapist's work is with the parents. Parents will learn techniques for coping with and improving their child's behavior. Sometimes only the child with the ADHD needs counseling. Your caregivers can help   you make these decisions.  Children with ADHD may need help in organizing. Some helpful tips include:  Keep  routines the same every day from wake-up time to bedtime. Schedule everything. This includes homework and playtime. This should include outdoor and indoor recreation. Keep the schedule on the refrigerator or a bulletin board where it is frequently seen. Mark schedule changes as far in advance as possible.  Have a place for everything and keep everything in its place. This includes clothing, backpacks, and school supplies.  Encourage writing down assignments and bringing home needed books.  Offer your child a well-balanced diet. Breakfast is especially important for school performance. Children should avoid drinks with caffeine including:  Soft drinks.  Coffee.  Tea.  However, some older children (adolescents) may find these drinks helpful in improving their attention.  Children with ADHD need consistent rules that they can understand and follow. If rules are followed, give small rewards. Children with ADHD often receive, and expect, criticism. Look for good behavior and praise it. Set realistic goals. Give clear instructions. Look for activities that can foster success and self-esteem. Make time for pleasant activities with your child. Give lots of affection.  Parents are their children's greatest advocates. Learn as much as possible about ADHD. This helps you become a stronger and better advocate for your child. It also helps you educate your child's teachers and instructors if they feel inadequate in these areas. Parent support groups are often helpful. A national group with local chapters is called CHADD (Children and Adults with Attention Deficit Hyperactivity Disorder). PROGNOSIS  There is no cure for ADHD. Children with the disorder seldom outgrow it. Many find adaptive ways to accommodate the ADHD as they mature. SEEK MEDICAL CARE IF:  Your child has repeated muscle twitches, cough or speech outbursts.  Your child has sleep problems.  Your child has a marked loss of  appetite.  Your child develops depression.  Your child has new or worsening behavioral problems.  Your child develops dizziness.  Your child has a racing heart.  Your child has stomach pains.  Your child develops headaches. Document Released: 10/15/2002 Document Revised: 01/17/2012 Document Reviewed: 05/27/2008 Exit[employer] Patient Information 2013 [employer].  

## 2013-01-01 NOTE — Assessment & Plan Note (Signed)
Restart adderall

## 2013-01-01 NOTE — Assessment & Plan Note (Signed)
D/w p t diet and exercise She does not want to see cardio at this time

## 2013-01-04 ENCOUNTER — Encounter: Payer: Self-pay | Admitting: Family Medicine

## 2013-01-09 ENCOUNTER — Other Ambulatory Visit (INDEPENDENT_AMBULATORY_CARE_PROVIDER_SITE_OTHER): Payer: BC Managed Care – PPO

## 2013-01-09 DIAGNOSIS — N390 Urinary tract infection, site not specified: Secondary | ICD-10-CM

## 2013-01-09 LAB — POCT URINALYSIS DIPSTICK
Glucose, UA: NEGATIVE
Nitrite, UA: NEGATIVE
Urobilinogen, UA: 0.2

## 2013-01-11 LAB — URINE CULTURE: Colony Count: 50000

## 2013-01-15 ENCOUNTER — Other Ambulatory Visit: Payer: Self-pay | Admitting: Family Medicine

## 2013-01-15 NOTE — Telephone Encounter (Signed)
Last seen 01/01/13 and filled 11/25/12 #60 with 1 refill. Please advise    KP

## 2013-01-26 ENCOUNTER — Other Ambulatory Visit: Payer: Self-pay | Admitting: Orthopaedic Surgery

## 2013-01-26 ENCOUNTER — Telehealth: Payer: Self-pay | Admitting: Family Medicine

## 2013-01-26 DIAGNOSIS — M549 Dorsalgia, unspecified: Secondary | ICD-10-CM

## 2013-01-26 DIAGNOSIS — F988 Other specified behavioral and emotional disorders with onset usually occurring in childhood and adolescence: Secondary | ICD-10-CM

## 2013-01-26 MED ORDER — AMPHETAMINE-DEXTROAMPHET ER 30 MG PO CP24: ORAL_CAPSULE | ORAL | Status: DC

## 2013-01-26 NOTE — Telephone Encounter (Signed)
Ok to refill 

## 2013-01-26 NOTE — Telephone Encounter (Signed)
Rx ready for pick up., Pt aware. 

## 2013-01-26 NOTE — Telephone Encounter (Signed)
Patient is calling, states for the 3rd time, needs refill of her Adderall 30mg .  She is now completely out.  Patient needs to come by our office and pick up today before we close.

## 2013-02-05 ENCOUNTER — Other Ambulatory Visit: Payer: Self-pay

## 2013-02-07 ENCOUNTER — Ambulatory Visit
Admission: RE | Admit: 2013-02-07 | Discharge: 2013-02-07 | Disposition: A | Discharge status: Home / Self Care | Payer: Medicare PPO | Source: Ambulatory Visit | Attending: Orthopaedic Surgery | Admitting: Orthopaedic Surgery

## 2013-02-07 DIAGNOSIS — M549 Dorsalgia, unspecified: Secondary | ICD-10-CM

## 2013-02-14 ENCOUNTER — Telehealth: Payer: Self-pay | Admitting: Family Medicine

## 2013-02-14 DIAGNOSIS — F419 Anxiety disorder, unspecified: Secondary | ICD-10-CM

## 2013-02-14 MED ORDER — VENLAFAXINE HCL ER 75 MG PO CP24: ORAL_CAPSULE | ORAL | Status: DC

## 2013-02-14 NOTE — Telephone Encounter (Signed)
Refill: Venlafaxine xr 75 mg 24 hr capsule. 3 tabs daily. Qty 90. Last fill 01-15-13

## 2013-02-22 ENCOUNTER — Other Ambulatory Visit: Payer: Self-pay | Admitting: Orthopaedic Surgery

## 2013-02-22 ENCOUNTER — Telehealth: Payer: Self-pay | Admitting: Family Medicine

## 2013-02-22 DIAGNOSIS — F988 Other specified behavioral and emotional disorders with onset usually occurring in childhood and adolescence: Secondary | ICD-10-CM

## 2013-02-22 MED ORDER — AMPHETAMINE-DEXTROAMPHET ER 30 MG PO CP24: ORAL_CAPSULE | ORAL | Status: DC

## 2013-02-22 NOTE — Telephone Encounter (Signed)
VM left making the patient aware Rx ready for pick up    KP 

## 2013-02-22 NOTE — Telephone Encounter (Signed)
Pt called and asked for dr Laury Axon to refill her adderall rx and if she could pick it up by this afternoon. Thanks

## 2013-02-26 ENCOUNTER — Other Ambulatory Visit: Payer: Self-pay | Admitting: Family Medicine

## 2013-02-26 NOTE — Telephone Encounter (Signed)
Last seen 01/01/13 and filled 01/15/13 #60. Please advise     KP

## 2013-03-05 ENCOUNTER — Other Ambulatory Visit: Payer: Medicare PPO

## 2013-03-05 ENCOUNTER — Other Ambulatory Visit: Payer: Self-pay

## 2013-03-05 DIAGNOSIS — Z1231 Encounter for screening mammogram for malignant neoplasm of breast: Secondary | ICD-10-CM

## 2013-03-22 ENCOUNTER — Telehealth: Payer: Self-pay | Admitting: Family Medicine

## 2013-03-22 DIAGNOSIS — F988 Other specified behavioral and emotional disorders with onset usually occurring in childhood and adolescence: Secondary | ICD-10-CM

## 2013-03-22 MED ORDER — AMPHETAMINE-DEXTROAMPHET ER 30 MG PO CP24: ORAL_CAPSULE | ORAL | Status: DC

## 2013-03-22 NOTE — Telephone Encounter (Signed)
Pt aware Rx ready for pick up 

## 2013-03-22 NOTE — Telephone Encounter (Signed)
Patient called requesting rx for adderall. Call [phone] when ready for pick up.

## 2013-03-23 ENCOUNTER — Other Ambulatory Visit: Payer: Self-pay | Admitting: *Deleted

## 2013-03-23 DIAGNOSIS — R51 Headache: Secondary | ICD-10-CM

## 2013-03-23 MED ORDER — SUMATRIPTAN SUCCINATE 50 MG PO TABS: ORAL_TABLET | ORAL | Status: DC

## 2013-03-23 NOTE — Telephone Encounter (Signed)
Rx sent 

## 2013-03-30 ENCOUNTER — Other Ambulatory Visit: Payer: Medicare PPO

## 2013-03-30 ENCOUNTER — Ambulatory Visit: Payer: Medicare PPO

## 2013-04-04 ENCOUNTER — Telehealth: Payer: Self-pay | Admitting: General Practice

## 2013-04-04 NOTE — Telephone Encounter (Signed)
Received a letter from [facility] stating that the pt would need a PA for IMITREX if more than 10 tablets are used in a month. Called pt to verify how often she takes this medication. Per insurance pt is not able to get a refill on this med until 04-15-2013.

## 2013-04-06 ENCOUNTER — Other Ambulatory Visit: Payer: Medicare PPO

## 2013-04-09 ENCOUNTER — Ambulatory Visit
Admission: RE | Admit: 2013-04-09 | Discharge: 2013-04-09 | Disposition: A | Discharge status: Home / Self Care | Payer: Medicare PPO | Source: Ambulatory Visit

## 2013-04-09 ENCOUNTER — Ambulatory Visit
Admission: RE | Admit: 2013-04-09 | Discharge: 2013-04-09 | Disposition: A | Discharge status: Home / Self Care | Payer: Medicare PPO | Source: Ambulatory Visit | Attending: Orthopaedic Surgery | Admitting: Orthopaedic Surgery

## 2013-04-09 DIAGNOSIS — Z1231 Encounter for screening mammogram for malignant neoplasm of breast: Secondary | ICD-10-CM

## 2013-04-11 NOTE — Telephone Encounter (Signed)
Pt called back stating that she does not take more than that in a day. Disregard PA.

## 2013-04-17 ENCOUNTER — Other Ambulatory Visit: Payer: Self-pay | Admitting: Family Medicine

## 2013-04-18 ENCOUNTER — Telehealth: Payer: Self-pay | Admitting: Family Medicine

## 2013-04-18 DIAGNOSIS — F988 Other specified behavioral and emotional disorders with onset usually occurring in childhood and adolescence: Secondary | ICD-10-CM

## 2013-04-18 MED ORDER — AMPHETAMINE-DEXTROAMPHET ER 30 MG PO CP24: ORAL_CAPSULE | ORAL | Status: DC

## 2013-04-18 NOTE — Telephone Encounter (Signed)
Patient is calling to request a prescription for Adderall. She is wanting to come by today to pick it up.

## 2013-04-18 NOTE — Telephone Encounter (Signed)
Last seen 01/01/13 and filled 02/26/13 #60 with 1 refill. Please advise      KP

## 2013-04-18 NOTE — Telephone Encounter (Signed)
patient aware Rx ready for pick up.    KP 

## 2013-04-19 ENCOUNTER — Encounter: Payer: Self-pay | Admitting: Family Medicine

## 2013-04-19 ENCOUNTER — Ambulatory Visit (INDEPENDENT_AMBULATORY_CARE_PROVIDER_SITE_OTHER): Payer: Medicare PPO | Admitting: Family Medicine

## 2013-04-19 VITALS — BP 140/82 | HR 108 | Temp 98.2°F | Wt 199.6 lb

## 2013-04-19 DIAGNOSIS — R5381 Other malaise: Secondary | ICD-10-CM

## 2013-04-19 DIAGNOSIS — R609 Edema, unspecified: Secondary | ICD-10-CM

## 2013-04-19 DIAGNOSIS — R5383 Other fatigue: Secondary | ICD-10-CM

## 2013-04-19 DIAGNOSIS — N390 Urinary tract infection, site not specified: Secondary | ICD-10-CM

## 2013-04-19 DIAGNOSIS — M4120 Other idiopathic scoliosis, site unspecified: Secondary | ICD-10-CM | POA: Insufficient documentation

## 2013-04-19 DIAGNOSIS — M549 Dorsalgia, unspecified: Secondary | ICD-10-CM | POA: Insufficient documentation

## 2013-04-19 DIAGNOSIS — D518 Other vitamin B12 deficiency anemias: Secondary | ICD-10-CM

## 2013-04-19 DIAGNOSIS — M810 Age-related osteoporosis without current pathological fracture: Secondary | ICD-10-CM | POA: Insufficient documentation

## 2013-04-19 HISTORY — DX: Other idiopathic scoliosis, site unspecified: M41.20

## 2013-04-19 MED ORDER — KETOROLAC TROMETHAMINE 60 MG/2ML IM SOLN
60.0000 mg | Freq: Once | INTRAMUSCULAR | Status: AC
  Administered 2013-04-19: 60 mg via INTRAMUSCULAR

## 2013-04-19 MED ORDER — ALENDRONATE SODIUM 70 MG PO TABS: ORAL_TABLET | ORAL | Status: DC

## 2013-04-19 MED ORDER — FUROSEMIDE 20 MG PO TABS: 20.0000 mg | ORAL_TABLET | Freq: Every day | ORAL | Status: DC

## 2013-04-19 NOTE — Progress Notes (Signed)
  Subjective:    [name] is a 67 y.o. female who presents for evaluation of edema in both lower legs. The edema has been mild. Onset of symptoms was several weeks ago, and patient reports symptoms have waxes and wanes since that time. The edema is present intermittently. The patient states the problem is new. The swelling has been aggravated by dependency of involved area and increased salt intake. The swelling has been relieved by diuretics, support stockings, elevation of involved area. Associated factors include: nothing. Cardiac risk factors: advanced age (older than 46 for men, 67 for women), hypertension, obesity (BMI >= 30 kg/m2) and sedentary lifestyle.  Her husband is present and states the swelling occurs q3 weeks-- and goes away--hctz not working.  Pt also gets sores on legs when they swell.  No sob, no cp. No calf pain.    Pt has multiple other questions--see a/p.  The following portions of the patient's history were reviewed and updated as appropriate: allergies, current medications, past family history, past medical history, past social history, past surgical history and problem list.  Review of Systems Pertinent items are noted in HPI.   Objective:    BP 140/82  Pulse 108  Temp(Src) 98.2 F (36.8 C) (Oral)  Wt 199 lb 9.6 oz (90.538 kg)  BMI 33.22 kg/m2  SpO2 97% General appearance: alert, cooperative, appears stated age and no distress Lungs: clear to auscultation bilaterally Heart: S1, S2 normal Extremities: edema tr pitting edema b/l  Skin: small scabbed lesions b/l low ext   Cardiographics ECG: not done  Imaging Chest x-ray: not indicated   Assessment:     Edema .    Plan:    Recommendations: decrease sodium in the diet, elevate feet above the level of the heart whenever possible and use of compression stockings. The patient was also instructed to call IMMEDIATELY (i.e., day or night) if any cardiopulmonary symptoms occur, especially chest pain,  shortness of breath, dyspnea on exertion, paroxysmal nocturnal dyspnea, or orthopnea, and these were explained. Follow up in 2 weeks and as needed.  Change diuretic to lasix

## 2013-04-19 NOTE — Assessment & Plan Note (Signed)
bmd reviewed with pt Start fosamax

## 2013-04-19 NOTE — Assessment & Plan Note (Signed)
tordol given F/u ortho

## 2013-04-19 NOTE — Assessment & Plan Note (Signed)
Change to lasix Vascular eval

## 2013-04-19 NOTE — Assessment & Plan Note (Signed)
Check labs 

## 2013-04-19 NOTE — Patient Instructions (Addendum)

## 2013-04-20 ENCOUNTER — Other Ambulatory Visit: Payer: Self-pay | Admitting: *Deleted

## 2013-04-20 DIAGNOSIS — R609 Edema, unspecified: Secondary | ICD-10-CM

## 2013-04-20 LAB — BASIC METABOLIC PANEL
BUN: 12 mg/dL (ref 6–23)
CO2: 25 mEq/L (ref 19–32)
GFR: 71.29 mL/min (ref >60.00)
Glucose, Bld: 92 mg/dL (ref 70–99)
Potassium: 4.2 mEq/L (ref 3.5–5.1)

## 2013-04-20 LAB — CBC WITH DIFFERENTIAL/PLATELET
Basophils Absolute: 0.1 10*3/uL (ref 0.0–0.1)
Eosinophils Absolute: 0.1 10*3/uL (ref 0.0–0.7)
HCT: 43.6 % (ref 36.0–46.0)
Lymphs Abs: 2.8 10*3/uL (ref 0.7–4.0)
MCHC: 33.5 g/dL (ref 30.0–36.0)
Monocytes Absolute: 0.6 10*3/uL (ref 0.1–1.0)
Monocytes Relative: 6.6 % (ref 3.0–12.0)
Platelets: 336 10*3/uL (ref 150.0–400.0)
RDW: 15.4 % — ABNORMAL HIGH (ref 11.5–14.6)

## 2013-04-20 LAB — VITAMIN B12: Vitamin B-12: >1500 pg/mL — ABNORMAL HIGH (ref 211–911)

## 2013-04-20 LAB — POCT URINALYSIS DIPSTICK
Glucose, UA: NEGATIVE
Nitrite, UA: POSITIVE
Protein, UA: NEGATIVE
Urobilinogen, UA: 0.2

## 2013-04-20 LAB — HEPATIC FUNCTION PANEL
Albumin: 4.1 g/dL (ref 3.5–5.2)
Total Bilirubin: 1.2 mg/dL (ref 0.3–1.2)

## 2013-04-20 LAB — TSH: TSH: 1.19 u[IU]/mL (ref 0.35–5.50)

## 2013-04-20 LAB — MAGNESIUM: Magnesium: 2.1 mg/dL (ref 1.5–2.5)

## 2013-04-20 NOTE — Addendum Note (Signed)
Addended by: Silvio Pate D on: 04/20/2013 04:21 PM   Modules accepted: Orders

## 2013-04-24 ENCOUNTER — Other Ambulatory Visit: Payer: Self-pay

## 2013-04-24 MED ORDER — CIPROFLOXACIN HCL 500 MG PO TABS: 500.0000 mg | ORAL_TABLET | Freq: Two times a day (BID) | ORAL | Status: DC

## 2013-04-25 LAB — VITAMIN D 1,25 DIHYDROXY: Vitamin D 1, 25 (OH)2 Total: 69 pg/mL (ref 18–72)

## 2013-04-30 ENCOUNTER — Encounter: Payer: Self-pay | Admitting: Family Medicine

## 2013-04-30 ENCOUNTER — Ambulatory Visit: Payer: Medicare PPO

## 2013-05-15 ENCOUNTER — Telehealth: Payer: Self-pay | Admitting: Family Medicine

## 2013-05-15 NOTE — Telephone Encounter (Signed)
Release form received and labs have been faxed   KP

## 2013-05-15 NOTE — Telephone Encounter (Signed)
Melissa with the [facility] is calling to discuss the patient's labs that she has had at our office. States that they are starting the patient on new medications and needs to know which labs she has had so they will not have to re-draw labs that her insurance company may not pay for again.

## 2013-05-15 NOTE — Telephone Encounter (Signed)
Left message to call office

## 2013-05-16 ENCOUNTER — Telehealth: Payer: Self-pay | Admitting: *Deleted

## 2013-05-16 ENCOUNTER — Other Ambulatory Visit: Payer: Self-pay | Admitting: Family Medicine

## 2013-05-16 DIAGNOSIS — F988 Other specified behavioral and emotional disorders with onset usually occurring in childhood and adolescence: Secondary | ICD-10-CM

## 2013-05-16 MED ORDER — CLONAZEPAM 0.5 MG PO TABS: ORAL_TABLET | ORAL | Status: DC

## 2013-05-16 NOTE — Addendum Note (Signed)
Addended by: Arnette Norris on: 05/16/2013 05:04 PM   Modules accepted: Orders

## 2013-05-16 NOTE — Telephone Encounter (Signed)
Ok to refill-- she was just seen

## 2013-05-16 NOTE — Telephone Encounter (Signed)
Last OV 04-19-13, Last refilled 04-18-13 #60

## 2013-05-16 NOTE — Telephone Encounter (Signed)
Last seen 04/19/13 and filled 04/17/13 #60. Please advise     KP

## 2013-05-17 MED ORDER — AMPHETAMINE-DEXTROAMPHET ER 30 MG PO CP24: ORAL_CAPSULE | ORAL | Status: DC

## 2013-05-17 NOTE — Telephone Encounter (Signed)
Patient aware Rx ready for pick up.      KP 

## 2013-05-22 ENCOUNTER — Other Ambulatory Visit: Payer: Self-pay | Admitting: *Deleted

## 2013-05-22 DIAGNOSIS — I739 Peripheral vascular disease, unspecified: Secondary | ICD-10-CM

## 2013-06-07 ENCOUNTER — Encounter: Payer: Medicare PPO | Admitting: Vascular Surgery

## 2013-06-11 ENCOUNTER — Encounter: Payer: Self-pay | Admitting: Vascular Surgery

## 2013-06-12 ENCOUNTER — Encounter: Payer: Medicare PPO | Admitting: Vascular Surgery

## 2013-06-13 ENCOUNTER — Other Ambulatory Visit: Payer: Self-pay

## 2013-06-15 ENCOUNTER — Telehealth: Payer: Self-pay | Admitting: *Deleted

## 2013-06-15 DIAGNOSIS — F988 Other specified behavioral and emotional disorders with onset usually occurring in childhood and adolescence: Secondary | ICD-10-CM

## 2013-06-15 MED ORDER — AMPHETAMINE-DEXTROAMPHET ER 30 MG PO CP24: ORAL_CAPSULE | ORAL | Status: DC

## 2013-06-15 NOTE — Addendum Note (Signed)
Addended by: Arnette Norris on: 06/15/2013 05:06 PM   Modules accepted: Orders

## 2013-06-15 NOTE — Telephone Encounter (Signed)
We need to know all meds she is taking---- did another provider rx pain meds for her.?

## 2013-06-15 NOTE — Telephone Encounter (Signed)
Patient called in for a prescription for Adderall. Last refill on 05-17-2013. Last visit was 04-19-2013. Patient contract is on file.  Patient is HR.

## 2013-06-15 NOTE — Telephone Encounter (Deleted)
Patient called requesting prescription refill for adderall

## 2013-06-15 NOTE — Telephone Encounter (Signed)
Dr. Ethelene Hal prescribes the patient Morphine for chronic pain. Please advise       KP

## 2013-06-15 NOTE — Telephone Encounter (Signed)
Refill adderall x1

## 2013-06-15 NOTE — Telephone Encounter (Signed)
Patient is high risk and last UDS was done on 10/17/12. Please advise      KP

## 2013-06-15 NOTE — Telephone Encounter (Signed)
patient made aware Rx ready for pick up on Monday.       KP

## 2013-07-05 ENCOUNTER — Telehealth: Payer: Self-pay | Admitting: Family Medicine

## 2013-07-05 NOTE — Telephone Encounter (Addendum)
Pt husband called to talk about his wife referral that we scheduled for her at [facility]ascular surgery. Wanted to see if we could call and try to get her in sooner at the [facility] place.

## 2013-07-06 NOTE — Telephone Encounter (Signed)
Apt scheduled for Tues at 1 pm.       KP

## 2013-07-06 NOTE — Telephone Encounter (Signed)
If she is having trouble breathing ---that is her lungs---- she needs to be seen here or er if she is sob now

## 2013-07-06 NOTE — Telephone Encounter (Signed)
Spoke with Husband and he said the soonest she can get in will be in Oct. He also stated wife is having difficulty breathing when her legs swell and she is fatigue. He said the [facility] said if Dr.Lowne would call they would try to get her in sooner.      Please advise      KP

## 2013-07-10 ENCOUNTER — Telehealth: Payer: Self-pay

## 2013-07-10 ENCOUNTER — Ambulatory Visit (HOSPITAL_BASED_OUTPATIENT_CLINIC_OR_DEPARTMENT_OTHER)
Admission: RE | Admit: 2013-07-10 | Discharge: 2013-07-10 | Disposition: A | Discharge status: Home / Self Care | Payer: Medicare PPO | Source: Ambulatory Visit | Attending: Family Medicine | Admitting: Family Medicine

## 2013-07-10 ENCOUNTER — Encounter: Payer: Self-pay | Admitting: Family Medicine

## 2013-07-10 ENCOUNTER — Ambulatory Visit (INDEPENDENT_AMBULATORY_CARE_PROVIDER_SITE_OTHER): Payer: Medicare PPO | Admitting: Family Medicine

## 2013-07-10 ENCOUNTER — Other Ambulatory Visit: Payer: Self-pay | Admitting: Family Medicine

## 2013-07-10 VITALS — BP 126/80 | HR 92 | Temp 98.6°F | Wt 198.0 lb

## 2013-07-10 DIAGNOSIS — R9389 Abnormal findings on diagnostic imaging of other specified body structures: Secondary | ICD-10-CM

## 2013-07-10 DIAGNOSIS — N390 Urinary tract infection, site not specified: Secondary | ICD-10-CM

## 2013-07-10 DIAGNOSIS — R609 Edema, unspecified: Secondary | ICD-10-CM

## 2013-07-10 DIAGNOSIS — R5381 Other malaise: Secondary | ICD-10-CM

## 2013-07-10 DIAGNOSIS — I5189 Other ill-defined heart diseases: Secondary | ICD-10-CM

## 2013-07-10 DIAGNOSIS — E876 Hypokalemia: Secondary | ICD-10-CM

## 2013-07-10 DIAGNOSIS — I519 Heart disease, unspecified: Secondary | ICD-10-CM

## 2013-07-10 DIAGNOSIS — R0602 Shortness of breath: Secondary | ICD-10-CM | POA: Insufficient documentation

## 2013-07-10 DIAGNOSIS — M7989 Other specified soft tissue disorders: Secondary | ICD-10-CM | POA: Insufficient documentation

## 2013-07-10 LAB — HEPATIC FUNCTION PANEL
ALT: 11 U/L (ref 0–35)
Albumin: 3.7 g/dL (ref 3.5–5.2)
Alkaline Phosphatase: 82 U/L (ref 39–117)
Bilirubin, Direct: 0.1 mg/dL (ref 0.0–0.3)
Total Protein: 6.9 g/dL (ref 6.0–8.3)

## 2013-07-10 LAB — CBC WITH DIFFERENTIAL/PLATELET
Basophils Relative: 0.7 % (ref 0.0–3.0)
Eosinophils Relative: 9 % — ABNORMAL HIGH (ref 0.0–5.0)
HCT: 39.6 % (ref 36.0–46.0)
Hemoglobin: 13.4 g/dL (ref 12.0–15.0)
MCV: 86.8 fl (ref 78.0–100.0)
Monocytes Absolute: 0.6 10*3/uL (ref 0.1–1.0)
Neutro Abs: 2.5 10*3/uL (ref 1.4–7.7)
Neutrophils Relative %: 38.9 % — ABNORMAL LOW (ref 43.0–77.0)
RBC: 4.56 Mil/uL (ref 3.87–5.11)
WBC: 6.4 10*3/uL (ref 4.5–10.5)

## 2013-07-10 LAB — BASIC METABOLIC PANEL
CO2: 30 mEq/L (ref 19–32)
Chloride: 101 mEq/L (ref 96–112)
Creatinine, Ser: 0.7 mg/dL (ref 0.4–1.2)
Potassium: 2.8 mEq/L — CL (ref 3.5–5.1)
Sodium: 136 mEq/L (ref 135–145)

## 2013-07-10 LAB — POCT URINALYSIS DIPSTICK
Nitrite, UA: NEGATIVE
Protein, UA: NEGATIVE
Spec Grav, UA: >=1.03
Urobilinogen, UA: 0.2

## 2013-07-10 MED ORDER — FUROSEMIDE 20 MG PO TABS: 40.0000 mg | ORAL_TABLET | Freq: Every day | ORAL | Status: DC

## 2013-07-10 MED ORDER — POTASSIUM CHLORIDE CRYS ER 20 MEQ PO TBCR: 20.0000 meq | EXTENDED_RELEASE_TABLET | Freq: Every day | ORAL | Status: DC

## 2013-07-10 NOTE — Patient Instructions (Addendum)

## 2013-07-10 NOTE — Telephone Encounter (Signed)
Patient aware and voiced understanding, she has agreed to take the Potassium and an apt has been scheduled for next week.     KP

## 2013-07-10 NOTE — Progress Notes (Signed)
  Subjective:    [name] is a 67 y.o. female who presents for evaluation of edema in both lower legs. The edema has been moderate. Onset of symptoms was several weeks ago, and patient reports symptoms have gradually improved since that time. The edema is present intermittently. The patient states the problem is new. The swelling has been aggravated by dependency of involved area. The swelling has been relieved by diuretics, elevation of involved area, low-salt diet. Associated factors include: shortness of breath. Cardiac risk factors: advanced age (older than 35 for men, 33 for women), dyslipidemia, hypertension, obesity (BMI >= 30 kg/m2) and sedentary lifestyle.  The following portions of the patient's history were reviewed and updated as appropriate: allergies, current medications, past family history, past medical history, past social history, past surgical history and problem list.  Review of Systems Pertinent items are noted in HPI.   Objective:    BP 126/80  Pulse 92  Temp(Src) 98.6 F (37 C) (Oral)  Wt 198 lb (89.812 kg)  BMI 32.95 kg/m2  SpO2 94% General appearance: alert, cooperative, appears stated age and no distress Neck: no adenopathy, no carotid bruit, no JVD, supple, symmetrical, trachea midline and thyroid not enlarged, symmetric, no tenderness/mass/nodules Lungs: clear to auscultation bilaterally Heart: S1, S2 normal Extremities: edema tr pitting edema   Cardiographics ECG: not done today  Imaging Chest x-ray: not available for review   Assessment:     Edema with inc SOB.  ----echo 12/2012 diastolic dysfunction  Plan:  Refer to cardiology Get cxr   Recommendations: decrease sodium in the diet, elevate feet above the level of the heart whenever possible, increase physical activity, use of compression stockings and weight loss. The patient was also instructed to call IMMEDIATELY (i.e., day or night) if any cardiopulmonary symptoms occur, especially chest  pain, shortness of breath, dyspnea on exertion, paroxysmal nocturnal dyspnea, or orthopnea, and these were explained. Follow up in 2 weeks and as needed.

## 2013-07-10 NOTE — Telephone Encounter (Signed)
That could be why she feels so weak.  I sent K supplement to pharmacy.---- recheck next week

## 2013-07-10 NOTE — Telephone Encounter (Signed)
Call from [facility] at the lab and the patient's potassium level is 2.8. Please advise      KP

## 2013-07-11 ENCOUNTER — Ambulatory Visit (HOSPITAL_BASED_OUTPATIENT_CLINIC_OR_DEPARTMENT_OTHER)
Admission: RE | Admit: 2013-07-11 | Discharge: 2013-07-11 | Disposition: A | Discharge status: Home / Self Care | Payer: Medicare PPO | Source: Ambulatory Visit | Attending: Family Medicine | Admitting: Family Medicine

## 2013-07-11 DIAGNOSIS — R918 Other nonspecific abnormal finding of lung field: Secondary | ICD-10-CM | POA: Insufficient documentation

## 2013-07-11 DIAGNOSIS — R9389 Abnormal findings on diagnostic imaging of other specified body structures: Secondary | ICD-10-CM

## 2013-07-11 MED ORDER — IOHEXOL 300 MG/ML  SOLN
80.0000 mL | Freq: Once | INTRAMUSCULAR | Status: AC | PRN
  Administered 2013-07-11: 80 mL via INTRAVENOUS

## 2013-07-12 ENCOUNTER — Encounter: Payer: Self-pay | Admitting: *Deleted

## 2013-07-12 LAB — URINE CULTURE: Colony Count: >=100000

## 2013-07-13 ENCOUNTER — Other Ambulatory Visit: Payer: Self-pay

## 2013-07-13 MED ORDER — CIPROFLOXACIN HCL 500 MG PO TABS: 500.0000 mg | ORAL_TABLET | Freq: Two times a day (BID) | ORAL | Status: DC

## 2013-07-14 LAB — VITAMIN D 1,25 DIHYDROXY: Vitamin D2 1, 25 (OH)2: <8 pg/mL

## 2013-07-16 ENCOUNTER — Other Ambulatory Visit (INDEPENDENT_AMBULATORY_CARE_PROVIDER_SITE_OTHER): Payer: Medicare PPO

## 2013-07-16 ENCOUNTER — Ambulatory Visit (INDEPENDENT_AMBULATORY_CARE_PROVIDER_SITE_OTHER): Payer: Medicare PPO | Admitting: Cardiology

## 2013-07-16 ENCOUNTER — Encounter: Payer: Self-pay | Admitting: Cardiology

## 2013-07-16 VITALS — BP 128/68 | HR 86 | Ht 65.0 in | Wt 192.0 lb

## 2013-07-16 DIAGNOSIS — R0602 Shortness of breath: Secondary | ICD-10-CM

## 2013-07-16 DIAGNOSIS — E876 Hypokalemia: Secondary | ICD-10-CM

## 2013-07-16 LAB — BASIC METABOLIC PANEL
Calcium: 9.3 mg/dL (ref 8.4–10.5)
Chloride: 106 mEq/L (ref 96–112)
Creatinine, Ser: 0.7 mg/dL (ref 0.4–1.2)
Sodium: 140 mEq/L (ref 135–145)

## 2013-07-16 MED ORDER — LISINOPRIL 2.5 MG PO TABS: 2.5000 mg | ORAL_TABLET | Freq: Every day | ORAL | Status: DC

## 2013-07-16 MED ORDER — BUMETANIDE 1 MG PO TABS: 1.0000 mg | ORAL_TABLET | Freq: Every day | ORAL | Status: DC

## 2013-07-16 NOTE — Progress Notes (Signed)
Patient ID: CLARYSSA SANDNER, female   DOB: 10/01/1946, 67 y.o.   MRN: [id number]    Patient Name: [name] Date of Encounter: 07/16/2013  Primary Care Provider:  Loreen Freud, DO Primary Cardiologist:  Tobias Alexander, MD  CC: lower extremity swelling  Problem List   Past Medical History  Diagnosis Date  . Anxiety   . Anemia   . HYPERLIPIDEMIA 05/15/2007    Qualifier: Diagnosis of  By: Janit Bern    . ADD (attention deficit disorder) 05/24/2012  . ANEMIA, MILD 06/26/2008    Qualifier: Diagnosis of  By: Janit Bern    . DIZZINESS 12/31/2009    Qualifier: Diagnosis of  By: Oswald Hillock    . GERD 12/03/2008    Qualifier: Diagnosis of  By: Freddy Jaksch    . Idiopathic scoliosis 04/19/2013  . Osteoporosis, unspecified 04/19/2013  . Other vitamin B12 deficiency anemia 10/30/2009    Qualifier: Diagnosis of  By: Floydene Flock    . Pica 12/28/2007    Qualifier: Diagnosis of  By: Janit Bern    . PALPITATIONS, RECURRENT 12/12/2009    Qualifier: Diagnosis of  By: Janit Bern    . PSTPRC STATUS, BARIATRIC SURGERY 05/15/2007    Qualifier: Diagnosis of  By: Janit Bern    . REDUCTION MAMMOPLASTY, HX OF 05/15/2007    Qualifier: Diagnosis of  By: Janit Bern    . TREMOR, ESSENTIAL 12/12/2009    Qualifier: Diagnosis of  By: Janit Bern    . Unspecified Myalgia and Myositis 05/15/2007    Centricity Description: FIBROMYALGIA Qualifier: Diagnosis of  By: Janit Bern   Centricity Description: MYALGIA Qualifier: Diagnosis of  By: Janit Bern     No past surgical history on file.  Allergies  No Known Allergies  HPI  67 year old female who presents for an evaluation of lower extremity edema. She has a history of hypertension, obesity and hyperlipidemia. It all started in February when she developed symmetrical bilateral LE swelling with blister and significant pain. She has been seen by her PCP who prescribed her Furosemide with significant improvement  at first but very limited response now. She has also a back pain that limits her ability to walk, but she states that about a year ago she was able to walk a mile without any difficulties.  She denies chest pain but admits to exertional shortness of breath. She was started on Adderall (amphetamine) in January 2014 for fatigue.  Home Medications  Prior to Admission medications   Medication Sig Start Date End Date Taking? Authorizing Provider  alendronate (FOSAMAX) 70 MG tablet 1 po q week.  Take with a full glass of water on an empty stomach. 04/19/13  Yes Lelon Perla, DO  amphetamine-dextroamphetamine (ADDERALL XR) 30 MG 24 hr capsule 2 po qd 06/15/13  Yes [name] Lowne, DO  ciprofloxacin (CIPRO) 500 MG tablet Take 1 tablet (500 mg total) by mouth 2 (two) times daily. 07/13/13  Yes [name] Lowne, DO  clonazePAM (KLONOPIN) 0.5 MG tablet TAKE 1 TABLET TWICE A DAY AS NEEDED FOR ANXIETY 05/16/13  Yes Grayling Congress Lowne, DO  cyclobenzaprine (FLEXERIL) 10 MG tablet Take 1 tablet (10 mg total) by mouth 2 (two) times daily as needed for muscle spasms. 09/25/12  Yes [name] Lowne, DO  furosemide (LASIX) 20 MG tablet Take 2 tablets (40 mg total) by mouth daily. 07/10/13  Yes Grayling Congress Lowne, DO  potassium chloride  SA (K-DUR,KLOR-CON) 20 MEQ tablet Take 1 tablet (20 mEq total) by mouth daily. 07/10/13  Yes Lelon Perla, DO  SUMAtriptan (IMITREX) 50 MG tablet 1 po  At first sign of headache, may repeat in 2 h x1 03/23/13  Yes Lelon Perla, DO  venlafaxine XR (EFFEXOR-XR) 75 MG 24 hr capsule 3 Tablets daily 02/14/13  Yes Lelon Perla, DO    Family History  No family history on file.  Social History  History   Social History  . Marital Status: Married    Spouse Name: N/A    Number of Children: N/A  . Years of Education: N/A   Occupational History  . Not on file.   Social History Main Topics  . Smoking status: Never Smoker   . Smokeless tobacco: Not on file  . Alcohol Use: Not on file  . Drug Use:  No  . Sexual Activity: Yes    Partners: Male   Other Topics Concern  . Not on file   Social History Narrative  . No narrative on file     Review of Systems General:  No chills, fever, night sweats or weight changes.  Cardiovascular:  No chest pain, + dyspnea on exertion, + B/L lower extremity edema, orthopnea, palpitations, paroxysmal nocturnal dyspnea. Dermatological: No rash, lesions/masses Respiratory: No cough, dyspnea Urologic: No hematuria, dysuria Abdominal:   No nausea, vomiting, diarrhea, bright red blood per rectum, melena, or hematemesis Neurologic:  No visual changes, wkns, changes in mental status. All other systems reviewed and are otherwise negative except as noted above.  Physical Exam  Blood pressure 128/68, pulse 86, height 5\' 5"  (1.651 m), weight 192 lb (87.091 kg).  General: Pleasant, NAD Psych: Normal affect. Neuro: Alert and oriented X 3. Moves all extremities spontaneously. HEENT: Normal  Neck: Supple without bruits or JVD. Lungs:  Resp regular and unlabored, CTA. Heart: RRR no s3, s4, or murmurs. Abdomen: Soft, non-tender, non-distended, BS + x 4.  Extremities: No clubbing, cyanosis. DP/PT/Radials 2+ and equal bilaterally. B/L lower extremity edema  Accessory Clinical Findings  ECG - SR, HR 86, normal intervals, no ST-T wave changes  Assessment & Plan  44 yaer old female with HTN, hyperlipidemia with dyspnea on exertion and lower extremity edema. Her echocardiogram shows preserved EF 65% but distolic dysfunction. He lower extremity edema is most probably a combination of heart failure with preserved ejection fraction and chronic venous insufficiency. We will d/c furosemide, start Bumex 1 mg PO daily, prescribe compression stockings and encourage the patient to physical activity.  We will also discontinue Adderall that is known sympatomimetic and can be associated with fluid retention and cardiomyopathy. We will also start a low dose Lisinopril 2.5 mg  daily for afterload reduction.    We will follow the patient in our clinic with follow up labs (lytes, crea)  Lars Masson, MD 07/16/2013, 4:40 PM

## 2013-07-16 NOTE — Patient Instructions (Addendum)
Start Bumex 1 mg daily  Compression Stockings prescription given  Stop Adderall    Lab work ( [facility] )  Thursday 07/26/13   7:30 am to 5:00 pm   Your physician recommends that you schedule a follow-up appointment in: 2 weeks

## 2013-07-17 ENCOUNTER — Other Ambulatory Visit: Payer: Self-pay | Admitting: Family Medicine

## 2013-07-17 NOTE — Telephone Encounter (Signed)
Last seen 07/10/13 and filled 05/16/13 #[id number] with 1 refill. Please advise      KP

## 2013-07-23 ENCOUNTER — Telehealth: Payer: Self-pay | Admitting: Cardiology

## 2013-07-23 NOTE — Telephone Encounter (Signed)
Started on lisinotril at last vist.  BP has been running low.   109/67    117/71    Are the twp readings.

## 2013-07-23 NOTE — Telephone Encounter (Signed)
**Note De-Identified [name] Obfuscation** Pt is concerned that her BP may be to running to low. At last OV with Dr Delton See on 07/16/13 the pt was advised to start taking Lisinopril 2.5 mg daily.  The pt states that last night her BP was 109/67 and this morning her BP was 117/71. Pt is advised to take Lisinopril dose in the mornings, wait about an hour or 2 then check her BP, keep a diary of BP readings and to call me on Friday with those readings. I will then discuss with Dr Delton See if necessary. She verbalized understanding and agrees with plan.

## 2013-07-26 ENCOUNTER — Other Ambulatory Visit: Payer: Medicare PPO

## 2013-07-26 ENCOUNTER — Ambulatory Visit: Payer: Medicare PPO | Admitting: Family Medicine

## 2013-07-27 ENCOUNTER — Ambulatory Visit (INDEPENDENT_AMBULATORY_CARE_PROVIDER_SITE_OTHER): Payer: Medicare PPO | Admitting: *Deleted

## 2013-07-27 DIAGNOSIS — R609 Edema, unspecified: Secondary | ICD-10-CM

## 2013-07-27 LAB — BASIC METABOLIC PANEL
BUN: 18 mg/dL (ref 6–23)
CO2: 28 mEq/L (ref 19–32)
Calcium: 9 mg/dL (ref 8.4–10.5)
Chloride: 97 mEq/L (ref 96–112)
Creatinine, Ser: 1.3 mg/dL — ABNORMAL HIGH (ref 0.4–1.2)
GFR: 43.62 mL/min — ABNORMAL LOW (ref >60.00)
Glucose, Bld: 75 mg/dL (ref 70–99)
Potassium: 4.1 mEq/L (ref 3.5–5.1)
Sodium: 135 mEq/L (ref 135–145)

## 2013-07-30 ENCOUNTER — Ambulatory Visit: Payer: Medicare PPO | Admitting: Cardiology

## 2013-08-02 ENCOUNTER — Ambulatory Visit: Payer: Medicare PPO | Admitting: Cardiology

## 2013-08-10 ENCOUNTER — Telehealth: Payer: Self-pay | Admitting: Cardiology

## 2013-08-10 NOTE — Telephone Encounter (Signed)
Returned call to patient she stated she has been under a lot of stress and her B/P has been elevated 160/108,165/110.Dr.Nelson out of office today will check with DOD Dr.McAlhany and call her back.

## 2013-08-10 NOTE — Telephone Encounter (Signed)
New Problem  Bp is high and pt asks if she should increase on lisinopril. Please call to discuss.   Last readings   10pm 160/108 8am 165/110

## 2013-08-10 NOTE — Telephone Encounter (Signed)
Spoke to DOD Dr.McAlhany he advised to increase lisinopril to 5 mg daily.Advised to continue to monitor B/P and bring diary of B/P readings to office visit with Dr.Nelson 08/13/13.

## 2013-08-13 ENCOUNTER — Encounter: Payer: Self-pay | Admitting: Cardiology

## 2013-08-13 ENCOUNTER — Ambulatory Visit (INDEPENDENT_AMBULATORY_CARE_PROVIDER_SITE_OTHER): Payer: Medicare PPO | Admitting: Cardiology

## 2013-08-13 VITALS — BP 132/80 | HR 93 | Ht 65.0 in | Wt 195.0 lb

## 2013-08-13 DIAGNOSIS — I10 Essential (primary) hypertension: Secondary | ICD-10-CM

## 2013-08-13 MED ORDER — AMLODIPINE BESYLATE 5 MG PO TABS: 5.0000 mg | ORAL_TABLET | Freq: Every day | ORAL | Status: DC

## 2013-08-13 NOTE — Progress Notes (Signed)
Patient ID: [name], female   DOB: [birth year], 67 y.o.   MRN: [id number]   Patient Name: [name] Date of Encounter: 08/13/2013  Primary Care Provider:  Loreen Freud, DO Primary Cardiologist:  Tobias Alexander, MD  CC: lower extremity swelling  Problem List   Past Medical History  Diagnosis Date  . Anxiety   . Anemia   . HYPERLIPIDEMIA 05/15/2007    Qualifier: Diagnosis of  By: Janit Bern    . ADD (attention deficit disorder) 05/24/2012  . ANEMIA, MILD 06/26/2008    Qualifier: Diagnosis of  By: Janit Bern    . DIZZINESS 12/31/2009    Qualifier: Diagnosis of  By: Oswald Hillock    . GERD 12/03/2008    Qualifier: Diagnosis of  By: Freddy Jaksch    . Idiopathic scoliosis 04/19/2013  . Osteoporosis, unspecified 04/19/2013  . Other vitamin B12 deficiency anemia 10/30/2009    Qualifier: Diagnosis of  By: Floydene Flock    . Pica 12/28/2007    Qualifier: Diagnosis of  By: Janit Bern    . PALPITATIONS, RECURRENT 12/12/2009    Qualifier: Diagnosis of  By: Janit Bern    . PSTPRC STATUS, BARIATRIC SURGERY 05/15/2007    Qualifier: Diagnosis of  By: Janit Bern    . REDUCTION MAMMOPLASTY, HX OF 05/15/2007    Qualifier: Diagnosis of  By: Janit Bern    . TREMOR, ESSENTIAL 12/12/2009    Qualifier: Diagnosis of  By: Janit Bern    . Unspecified Myalgia and Myositis 05/15/2007    Centricity Description: FIBROMYALGIA Qualifier: Diagnosis of  By: Janit Bern   Centricity Description: MYALGIA Qualifier: Diagnosis of  By: Janit Bern     No past surgical history on file.  Allergies  No Known Allergies  HPI  67 year old female who presents for an evaluation of lower extremity edema. She has a history of hypertension, obesity and hyperlipidemia. It all started in February when she developed symmetrical bilateral LE swelling with blister and significant pain. She has been seen by her PCP who prescribed her Furosemide with significant improvement  at first but very limited response now. She has also a back pain that limits her ability to walk, but she states that about a year ago she was able to walk a mile without any difficulties.  She denies chest pain but admits to exertional shortness of breath. She was started on Adderall (amphetamine) in January 2014 for fatigue.  This is a 2 week follow up. The patient reports resolution of her swelling as well as increase in her energy level and ability to walk.  Home Medications  Prior to Admission medications   Medication Sig Start Date End Date Taking? Authorizing Provider  alendronate (FOSAMAX) 70 MG tablet 1 po q week.  Take with a full glass of water on an empty stomach. 04/19/13  Yes Lelon Perla, DO  amphetamine-dextroamphetamine (ADDERALL XR) 30 MG 24 hr capsule 2 po qd 06/15/13  Yes [name] Lowne, DO  ciprofloxacin (CIPRO) 500 MG tablet Take 1 tablet (500 mg total) by mouth 2 (two) times daily. 07/13/13  Yes [name] Lowne, DO  clonazePAM (KLONOPIN) 0.5 MG tablet TAKE 1 TABLET TWICE A DAY AS NEEDED FOR ANXIETY 05/16/13  Yes Grayling Congress Lowne, DO  cyclobenzaprine (FLEXERIL) 10 MG tablet Take 1 tablet (10 mg total) by mouth 2 (two) times daily as needed for muscle spasms. 09/25/12  Yes Lelon Perla,  DO  furosemide (LASIX) 20 MG tablet Take 2 tablets (40 mg total) by mouth daily. 07/10/13  Yes [name] Lowne, DO  potassium chloride SA (K-DUR,KLOR-CON) 20 MEQ tablet Take 1 tablet (20 mEq total) by mouth daily. 07/10/13  Yes Lelon Perla, DO  SUMAtriptan (IMITREX) 50 MG tablet 1 po  At first sign of headache, may repeat in 2 h x1 03/23/13  Yes Lelon Perla, DO  venlafaxine XR (EFFEXOR-XR) 75 MG 24 hr capsule 3 Tablets daily 02/14/13  Yes Lelon Perla, DO    Family History  No family history on file.  Social History  History   Social History  . Marital Status: Married    Spouse Name: N/A    Number of Children: N/A  . Years of Education: N/A   Occupational History  . Not on file.    Social History Main Topics  . Smoking status: Never Smoker   . Smokeless tobacco: Not on file  . Alcohol Use: Not on file  . Drug Use: No  . Sexual Activity: Yes    Partners: Male   Other Topics Concern  . Not on file   Social History Narrative  . No narrative on file    Review of Systems General:  No chills, fever, night sweats or weight changes.  Cardiovascular:  No chest pain, + dyspnea on exertion, + B/L lower extremity edema, orthopnea, palpitations, paroxysmal nocturnal dyspnea. Dermatological: No rash, lesions/masses Respiratory: No cough, dyspnea Urologic: No hematuria, dysuria Abdominal:   No nausea, vomiting, diarrhea, bright red blood per rectum, melena, or hematemesis Neurologic:  No visual changes, wkns, changes in mental status. All other systems reviewed and are otherwise negative except as noted above.  Physical Exam  Filed Vitals:   08/13/13 1429  BP: 132/80  Pulse: 93   General: Pleasant, NAD Psych: Normal affect. Neuro: Alert and oriented X 3. Moves all extremities spontaneously. HEENT: Normal  Neck: Supple without bruits or JVD. Lungs:  Resp regular and unlabored, CTA. Heart: RRR no s3, s4, or murmurs. Abdomen: Soft, non-tender, non-distended, BS + x 4.  Extremities: No clubbing, cyanosis. DP/PT/Radials 2+ and equal bilaterally. B/L lower extremity edema  Accessory Clinical Findings  ECG - SR, HR 86, normal intervals, no ST-T wave changes   Assessment & Plan  67 year old female with HTN, hyperlipidemia with dyspnea on exertion and lower extremity edema.   1. Lower extremity edema - Her echocardiogram shows preserved EF 65% but diastolic dysfunction. He lower extremity edema is most probably a combination of heart failure with preserved ejection fraction and chronic venous insufficiency. Resolution of swelling after use of Bumex but  worsening crea. We will discontinue. Hold Adderall that is known sympatomimetic and can be associated with fluid  retention and cardiomyopathy. Again she was encouraged to use compression stockings and to increase her physical activity. Recheck in 10 days.  2. Acute kidney insufficiency - we will hold all the diuretics as well as Lisinopril. Recheck Crea/GFR in 10 days.  3. Hypertension controlled today, but home diary with multiple values in 150-160' range. As we are stopping Lisinopril and Bumex, we will start amlodipine 5 mg daily and recheck during the next visit.  Follow up in 10 days with labs drawn a day prior.    Tobias Alexander, Rexene Edison, MD 08/13/2013, 2:05 PM

## 2013-08-13 NOTE — Patient Instructions (Addendum)
Stop Lisinopril  Stop Bumex   Start Amlodipine 5 mg daily   Your physician recommends that you schedule a follow-up appointment in: 10 days with lab work [employer] )

## 2013-08-22 ENCOUNTER — Other Ambulatory Visit: Payer: Self-pay | Admitting: Family Medicine

## 2013-08-23 ENCOUNTER — Other Ambulatory Visit (INDEPENDENT_AMBULATORY_CARE_PROVIDER_SITE_OTHER): Payer: Medicare PPO

## 2013-08-23 DIAGNOSIS — I10 Essential (primary) hypertension: Secondary | ICD-10-CM

## 2013-08-23 LAB — BASIC METABOLIC PANEL
BUN: 10 mg/dL (ref 6–23)
CO2: 30 mEq/L (ref 19–32)
Calcium: 9.3 mg/dL (ref 8.4–10.5)
Chloride: 105 mEq/L (ref 96–112)
Creatinine, Ser: 0.8 mg/dL (ref 0.4–1.2)
GFR: 74.23 mL/min (ref >60.00)
Glucose, Bld: 70 mg/dL (ref 70–99)
Potassium: 3.6 mEq/L (ref 3.5–5.1)
Sodium: 142 mEq/L (ref 135–145)

## 2013-08-24 ENCOUNTER — Encounter: Payer: Self-pay | Admitting: Cardiology

## 2013-08-24 ENCOUNTER — Ambulatory Visit (INDEPENDENT_AMBULATORY_CARE_PROVIDER_SITE_OTHER): Payer: Medicare PPO | Admitting: Cardiology

## 2013-08-24 VITALS — BP 124/78 | HR 80 | Ht 65.0 in | Wt 199.0 lb

## 2013-08-24 DIAGNOSIS — I519 Heart disease, unspecified: Secondary | ICD-10-CM

## 2013-08-24 DIAGNOSIS — I10 Essential (primary) hypertension: Secondary | ICD-10-CM | POA: Insufficient documentation

## 2013-08-24 DIAGNOSIS — R609 Edema, unspecified: Secondary | ICD-10-CM

## 2013-08-24 NOTE — Patient Instructions (Signed)
Your physician recommends that you continue on your current medications as directed. Please refer to the Current Medication list given to you today.  Your physician wants you to follow-up in: 6 months. You will receive a reminder letter in the mail two months in advance. If you don't receive a letter, please call our office to schedule the follow-up appointment.  

## 2013-08-24 NOTE — Progress Notes (Signed)
Patient ID: [name], female   DOB: 07/15/1946, 67 y.o.   MRN: [id number]     Patient Name: [name] Date of Encounter: 08/24/2013  Primary Care Provider:  Loreen Freud, DO Primary Cardiologist:  Tobias Alexander, MD  CC: lower extremity swelling  Problem List   Past Medical History  Diagnosis Date  . Anxiety   . Anemia   . HYPERLIPIDEMIA 05/15/2007    Qualifier: Diagnosis of  By: Janit Bern    . ADD (attention deficit disorder) 05/24/2012  . ANEMIA, MILD 06/26/2008    Qualifier: Diagnosis of  By: Janit Bern    . DIZZINESS 12/31/2009    Qualifier: Diagnosis of  By: Oswald Hillock    . GERD 12/03/2008    Qualifier: Diagnosis of  By: Freddy Jaksch    . Idiopathic scoliosis 04/19/2013  . Osteoporosis, unspecified 04/19/2013  . Other vitamin B12 deficiency anemia 10/30/2009    Qualifier: Diagnosis of  By: Floydene Flock    . Pica 12/28/2007    Qualifier: Diagnosis of  By: Janit Bern    . PALPITATIONS, RECURRENT 12/12/2009    Qualifier: Diagnosis of  By: Janit Bern    . PSTPRC STATUS, BARIATRIC SURGERY 05/15/2007    Qualifier: Diagnosis of  By: Janit Bern    . REDUCTION MAMMOPLASTY, HX OF 05/15/2007    Qualifier: Diagnosis of  By: Janit Bern    . TREMOR, ESSENTIAL 12/12/2009    Qualifier: Diagnosis of  By: Janit Bern    . Unspecified Myalgia and Myositis 05/15/2007    Centricity Description: FIBROMYALGIA Qualifier: Diagnosis of  By: Janit Bern   Centricity Description: MYALGIA Qualifier: Diagnosis of  By: Janit Bern     No past surgical history on file.  Allergies  No Known Allergies  HPI  67 year old female who presents for an evaluation of lower extremity edema. She has a history of hypertension, obesity and hyperlipidemia. It all started in February when she developed symmetrical bilateral LE swelling with blister and significant pain. She has been seen by her PCP who prescribed her Furosemide with significant  improvement at first but very limited response now. She has also a back pain that limits her ability to walk, but she states that about a year ago she was able to walk a mile without any difficulties.  She denies chest pain but admits to exertional shortness of breath. She was started on Adderall (amphetamine) in January 2014 for fatigue.  This is a 10 day follow up, no LE edema, resolution of energy.  Home Medications  Prior to Admission medications   Medication Sig Start Date End Date Taking? Authorizing Provider  alendronate (FOSAMAX) 70 MG tablet 1 po q week.  Take with a full glass of water on an empty stomach. 04/19/13  Yes Lelon Perla, DO  amphetamine-dextroamphetamine (ADDERALL XR) 30 MG 24 hr capsule 2 po qd 06/15/13  Yes [name] Lowne, DO  ciprofloxacin (CIPRO) 500 MG tablet Take 1 tablet (500 mg total) by mouth 2 (two) times daily. 07/13/13  Yes [name] Lowne, DO  clonazePAM (KLONOPIN) 0.5 MG tablet TAKE 1 TABLET TWICE A DAY AS NEEDED FOR ANXIETY 05/16/13  Yes Grayling Congress Lowne, DO  cyclobenzaprine (FLEXERIL) 10 MG tablet Take 1 tablet (10 mg total) by mouth 2 (two) times daily as needed for muscle spasms. 09/25/12  Yes [name] Lowne, DO  furosemide (LASIX) 20 MG tablet Take 2 tablets (40  mg total) by mouth daily. 07/10/13  Yes [name] Lowne, DO  potassium chloride SA (K-DUR,KLOR-CON) 20 MEQ tablet Take 1 tablet (20 mEq total) by mouth daily. 07/10/13  Yes Lelon Perla, DO  SUMAtriptan (IMITREX) 50 MG tablet 1 po  At first sign of headache, may repeat in 2 h x1 03/23/13  Yes Lelon Perla, DO  venlafaxine XR (EFFEXOR-XR) 75 MG 24 hr capsule 3 Tablets daily 02/14/13  Yes Lelon Perla, DO    Family History  No family history on file.  Social History  History   Social History  . Marital Status: Married    Spouse Name: N/A    Number of Children: N/A  . Years of Education: N/A   Occupational History  . Not on file.   Social History Main Topics  . Smoking status: Never Smoker     . Smokeless tobacco: Not on file  . Alcohol Use: Not on file  . Drug Use: No  . Sexual Activity: Yes    Partners: Male   Other Topics Concern  . Not on file   Social History Narrative  . No narrative on file    Review of Systems General:  No chills, fever, night sweats or weight changes.  Cardiovascular:  No chest pain, + dyspnea on exertion, + B/L lower extremity edema, orthopnea, palpitations, paroxysmal nocturnal dyspnea. Dermatological: No rash, lesions/masses Respiratory: No cough, dyspnea Urologic: No hematuria, dysuria Abdominal:   No nausea, vomiting, diarrhea, bright red blood per rectum, melena, or hematemesis Neurologic:  No visual changes, wkns, changes in mental status. All other systems reviewed and are otherwise negative except as noted above.  Physical Exam  Filed Vitals:   08/24/13 1635  BP: 124/78  Pulse: 80   General: Pleasant, NAD Psych: Normal affect. Neuro: Alert and oriented X 3. Moves all extremities spontaneously. HEENT: Normal  Neck: Supple without bruits or JVD. Lungs:  Resp regular and unlabored, CTA. Heart: RRR no s3, s4, or murmurs. Abdomen: Soft, non-tender, non-distended, BS + x 4.  Extremities: No clubbing, cyanosis. DP/PT/Radials 2+ and equal bilaterally. B/L lower extremity edema  Accessory Clinical Findings  ECG - SR, HR 86, normal intervals, no ST-T wave changes   Assessment & Plan  67 year old female with HTN, hyperlipidemia with dyspnea on exertion and lower extremity edema.   1. Lower extremity edema - Her echocardiogram shows preserved EF 65% but diastolic dysfunction. He lower extremity edema is most probably a combination of heart failure with preserved ejection fraction and chronic venous insufficiency. Resolution of swelling after use of Bumex but  worsening crea. We will discontinue. Hold Adderall that is known sympatomimetic and can be associated with fluid retention and cardiomyopathy.  After 10 days of Bumex, there is  no reccurency of her edema, we will discontinue.  2. Acute kidney insufficiency - After 10 days of Bumex and Lisinopril Crea is back to her baseline  3. Hypertension controlled today, continue amlodipine, hold lisinopril (CKD)  Follow up in 6 months.  Tobias Alexander, Rexene Edison, MD 08/24/2013, 4:58 PM

## 2013-09-11 ENCOUNTER — Other Ambulatory Visit: Payer: Self-pay | Admitting: Family Medicine

## 2013-09-12 NOTE — Telephone Encounter (Signed)
Last seen 07/10/13 and filled 07/17/13 #60 with 1 refill. Please advise     KP

## 2013-09-13 ENCOUNTER — Other Ambulatory Visit: Payer: Self-pay

## 2013-09-28 ENCOUNTER — Encounter: Payer: Self-pay | Admitting: Family Medicine

## 2013-10-02 ENCOUNTER — Encounter (HOSPITAL_BASED_OUTPATIENT_CLINIC_OR_DEPARTMENT_OTHER): Payer: Self-pay | Admitting: Emergency Medicine

## 2013-10-02 ENCOUNTER — Inpatient Hospital Stay (HOSPITAL_BASED_OUTPATIENT_CLINIC_OR_DEPARTMENT_OTHER): Payer: Medicare PPO

## 2013-10-02 ENCOUNTER — Emergency Department (HOSPITAL_BASED_OUTPATIENT_CLINIC_OR_DEPARTMENT_OTHER): Payer: Medicare PPO

## 2013-10-02 ENCOUNTER — Inpatient Hospital Stay (HOSPITAL_BASED_OUTPATIENT_CLINIC_OR_DEPARTMENT_OTHER)
Admission: EM | Admit: 2013-10-02 | Discharge: 2013-10-04 | DRG: 194 | Disposition: A | Discharge status: Home / Self Care | Payer: Medicare PPO | Attending: Internal Medicine | Admitting: Internal Medicine

## 2013-10-02 DIAGNOSIS — M4120 Other idiopathic scoliosis, site unspecified: Secondary | ICD-10-CM

## 2013-10-02 DIAGNOSIS — R1013 Epigastric pain: Secondary | ICD-10-CM

## 2013-10-02 DIAGNOSIS — G250 Essential tremor: Secondary | ICD-10-CM

## 2013-10-02 DIAGNOSIS — F3289 Other specified depressive episodes: Secondary | ICD-10-CM | POA: Diagnosis present

## 2013-10-02 DIAGNOSIS — G8929 Other chronic pain: Secondary | ICD-10-CM | POA: Diagnosis present

## 2013-10-02 DIAGNOSIS — I2699 Other pulmonary embolism without acute cor pulmonale: Secondary | ICD-10-CM

## 2013-10-02 DIAGNOSIS — M810 Age-related osteoporosis without current pathological fracture: Secondary | ICD-10-CM | POA: Diagnosis present

## 2013-10-02 DIAGNOSIS — K219 Gastro-esophageal reflux disease without esophagitis: Secondary | ICD-10-CM | POA: Diagnosis present

## 2013-10-02 DIAGNOSIS — Z9884 Bariatric surgery status: Secondary | ICD-10-CM

## 2013-10-02 DIAGNOSIS — D649 Anemia, unspecified: Secondary | ICD-10-CM

## 2013-10-02 DIAGNOSIS — J189 Pneumonia, unspecified organism: Secondary | ICD-10-CM | POA: Diagnosis present

## 2013-10-02 DIAGNOSIS — I5032 Chronic diastolic (congestive) heart failure: Secondary | ICD-10-CM | POA: Diagnosis present

## 2013-10-02 DIAGNOSIS — F988 Other specified behavioral and emotional disorders with onset usually occurring in childhood and adolescence: Secondary | ICD-10-CM | POA: Diagnosis present

## 2013-10-02 DIAGNOSIS — Z7982 Long term (current) use of aspirin: Secondary | ICD-10-CM

## 2013-10-02 DIAGNOSIS — T59811A Toxic effect of smoke, accidental (unintentional), initial encounter: Secondary | ICD-10-CM | POA: Diagnosis present

## 2013-10-02 DIAGNOSIS — I10 Essential (primary) hypertension: Secondary | ICD-10-CM | POA: Diagnosis present

## 2013-10-02 DIAGNOSIS — F341 Dysthymic disorder: Secondary | ICD-10-CM | POA: Diagnosis present

## 2013-10-02 DIAGNOSIS — I959 Hypotension, unspecified: Secondary | ICD-10-CM | POA: Diagnosis not present

## 2013-10-02 DIAGNOSIS — I519 Heart disease, unspecified: Secondary | ICD-10-CM | POA: Diagnosis present

## 2013-10-02 DIAGNOSIS — T59891A Toxic effect of other specified gases, fumes and vapors, accidental (unintentional), initial encounter: Secondary | ICD-10-CM | POA: Diagnosis present

## 2013-10-02 DIAGNOSIS — E785 Hyperlipidemia, unspecified: Secondary | ICD-10-CM | POA: Diagnosis present

## 2013-10-02 DIAGNOSIS — D518 Other vitamin B12 deficiency anemias: Secondary | ICD-10-CM | POA: Diagnosis present

## 2013-10-02 DIAGNOSIS — F411 Generalized anxiety disorder: Secondary | ICD-10-CM | POA: Diagnosis present

## 2013-10-02 DIAGNOSIS — IMO0001 Reserved for inherently not codable concepts without codable children: Secondary | ICD-10-CM | POA: Diagnosis present

## 2013-10-02 DIAGNOSIS — M549 Dorsalgia, unspecified: Secondary | ICD-10-CM | POA: Diagnosis present

## 2013-10-02 DIAGNOSIS — F329 Major depressive disorder, single episode, unspecified: Secondary | ICD-10-CM | POA: Diagnosis present

## 2013-10-02 HISTORY — DX: Essential (primary) hypertension: I10

## 2013-10-02 HISTORY — DX: Crushing injury of left wrist, initial encounter: S67.32XA

## 2013-10-02 HISTORY — DX: Unspecified osteoarthritis, unspecified site: M19.90

## 2013-10-02 HISTORY — DX: Fibromyalgia: M79.7

## 2013-10-02 HISTORY — DX: Other nonspecific abnormal finding of lung field: R91.8

## 2013-10-02 HISTORY — DX: Crushing injury of right shoulder and upper arm, initial encounter: S47.1XXA

## 2013-10-02 HISTORY — DX: Migraine, unspecified, not intractable, without status migrainosus: G43.909

## 2013-10-02 HISTORY — DX: Dorsalgia, unspecified: M54.9

## 2013-10-02 HISTORY — DX: Other chronic pain: G89.29

## 2013-10-02 LAB — CBC WITH DIFFERENTIAL/PLATELET
Eosinophils Absolute: 0.1 10*3/uL (ref 0.0–0.7)
Eosinophils Relative: 0 % (ref 0–5)
HCT: 37.2 % (ref 36.0–46.0)
Hemoglobin: 12.6 g/dL (ref 12.0–15.0)
Lymphs Abs: 1.1 10*3/uL (ref 0.7–4.0)
MCH: 30.7 pg (ref 26.0–34.0)
MCV: 90.5 fL (ref 78.0–100.0)
Monocytes Absolute: 1.5 10*3/uL — ABNORMAL HIGH (ref 0.1–1.0)
Monocytes Relative: 10 % (ref 3–12)
Neutrophils Relative %: 82 % — ABNORMAL HIGH (ref 43–77)
RBC: 4.11 MIL/uL (ref 3.87–5.11)

## 2013-10-02 LAB — CBC
HCT: 34.3 % — ABNORMAL LOW (ref 36.0–46.0)
Hemoglobin: 12.4 g/dL (ref 12.0–15.0)
MCV: 89.3 fL (ref 78.0–100.0)
Platelets: 200 10*3/uL (ref 150–400)
RBC: 3.84 MIL/uL — ABNORMAL LOW (ref 3.87–5.11)
RDW: 14 % (ref 11.5–15.5)
WBC: 10.9 10*3/uL — ABNORMAL HIGH (ref 4.0–10.5)

## 2013-10-02 LAB — POCT I-STAT, CHEM 8
Creatinine, Ser: 0.8 mg/dL (ref 0.50–1.10)
Glucose, Bld: 110 mg/dL — ABNORMAL HIGH (ref 70–99)
Hemoglobin: 13.3 g/dL (ref 12.0–15.0)
Potassium: 3 mEq/L — ABNORMAL LOW (ref 3.5–5.1)

## 2013-10-02 LAB — CREATININE, SERUM
Creatinine, Ser: 0.74 mg/dL (ref 0.50–1.10)
GFR calc Af Amer: >90 mL/min (ref >90)
GFR calc non Af Amer: 87 mL/min — ABNORMAL LOW (ref >90)

## 2013-10-02 MED ORDER — CLONAZEPAM 0.5 MG PO TABS
0.5000 mg | ORAL_TABLET | Freq: Two times a day (BID) | ORAL | Status: DC | PRN
  Administered 2013-10-02 – 2013-10-03 (×2): 0.5 mg via ORAL
  Filled 2013-10-02 (×2): qty 1

## 2013-10-02 MED ORDER — ASPIRIN 81 MG PO CHEW
81.0000 mg | CHEWABLE_TABLET | Freq: Every day | ORAL | Status: DC
  Administered 2013-10-02 – 2013-10-04 (×3): 81 mg via ORAL
  Filled 2013-10-02 (×3): qty 1

## 2013-10-02 MED ORDER — ACETAMINOPHEN 500 MG PO TABS
1000.0000 mg | ORAL_TABLET | Freq: Once | ORAL | Status: AC
  Administered 2013-10-02: 1000 mg via ORAL
  Filled 2013-10-02: qty 2

## 2013-10-02 MED ORDER — DEXTROSE 5 % IV SOLN
1.0000 g | INTRAVENOUS | Status: DC
  Administered 2013-10-03: 17:00:00 1 g via INTRAVENOUS
  Filled 2013-10-02 (×2): qty 10

## 2013-10-02 MED ORDER — ENOXAPARIN SODIUM 40 MG/0.4ML ~~LOC~~ SOLN
40.0000 mg | SUBCUTANEOUS | Status: DC
  Administered 2013-10-02 – 2013-10-03 (×2): 40 mg via SUBCUTANEOUS
  Filled 2013-10-02 (×3): qty 0.4

## 2013-10-02 MED ORDER — HEPARIN BOLUS VIA INFUSION
5000.0000 [IU] | Freq: Once | INTRAVENOUS | Status: AC
  Administered 2013-10-02: 5000 [IU] via INTRAVENOUS

## 2013-10-02 MED ORDER — ACETAMINOPHEN 325 MG PO TABS: 650.0000 mg | ORAL_TABLET | Freq: Once | ORAL | Status: DC

## 2013-10-02 MED ORDER — OXYCODONE HCL 5 MG PO TABS
15.0000 mg | ORAL_TABLET | ORAL | Status: DC | PRN
  Administered 2013-10-03 – 2013-10-04 (×5): 15 mg via ORAL
  Filled 2013-10-02 (×5): qty 3

## 2013-10-02 MED ORDER — SODIUM CHLORIDE 0.9 % IV SOLN
INTRAVENOUS | Status: DC
  Administered 2013-10-03: 05:00:00 via INTRAVENOUS

## 2013-10-02 MED ORDER — ONDANSETRON HCL 4 MG/2ML IJ SOLN: 4.0000 mg | Freq: Four times a day (QID) | INTRAMUSCULAR | Status: DC | PRN

## 2013-10-02 MED ORDER — VITAMIN D3 25 MCG (1000 UNIT) PO TABS
2000.0000 [IU] | ORAL_TABLET | Freq: Every day | ORAL | Status: DC
  Administered 2013-10-02 – 2013-10-04 (×3): 2000 [IU] via ORAL
  Filled 2013-10-02 (×3): qty 2

## 2013-10-02 MED ORDER — CEFTRIAXONE SODIUM 1 G IJ SOLR
INTRAMUSCULAR | Status: AC
  Administered 2013-10-02: 1000 mg
  Filled 2013-10-02: qty 10

## 2013-10-02 MED ORDER — ACETAMINOPHEN 325 MG PO TABS
650.0000 mg | ORAL_TABLET | Freq: Four times a day (QID) | ORAL | Status: DC | PRN
  Administered 2013-10-02 – 2013-10-03 (×2): 650 mg via ORAL
  Filled 2013-10-02 (×2): qty 2

## 2013-10-02 MED ORDER — POTASSIUM CHLORIDE CRYS ER 20 MEQ PO TBCR
40.0000 meq | EXTENDED_RELEASE_TABLET | Freq: Once | ORAL | Status: AC
  Administered 2013-10-02: 20:00:00 40 meq via ORAL
  Filled 2013-10-02: qty 2

## 2013-10-02 MED ORDER — DEXTROSE 5 % IV SOLN
500.0000 mg | INTRAVENOUS | Status: DC
  Administered 2013-10-03: 17:00:00 500 mg via INTRAVENOUS
  Filled 2013-10-02 (×2): qty 500

## 2013-10-02 MED ORDER — IOHEXOL 350 MG/ML SOLN
100.0000 mL | Freq: Once | INTRAVENOUS | Status: AC | PRN
  Administered 2013-10-02: 100 mL via INTRAVENOUS

## 2013-10-02 MED ORDER — FERROUS SULFATE 325 (65 FE) MG PO TABS
325.0000 mg | ORAL_TABLET | Freq: Every day | ORAL | Status: DC
  Administered 2013-10-03 – 2013-10-04 (×2): 325 mg via ORAL
  Filled 2013-10-02 (×3): qty 1

## 2013-10-02 MED ORDER — HEPARIN (PORCINE) IN NACL 100-0.45 UNIT/ML-% IJ SOLN
INTRAMUSCULAR | Status: AC
  Administered 2013-10-02: 25000 [IU] via INTRAVENOUS
  Filled 2013-10-02: qty 250

## 2013-10-02 MED ORDER — SODIUM CHLORIDE 0.9 % IV SOLN
Freq: Once | INTRAVENOUS | Status: AC
  Administered 2013-10-02: 15:00:00 via INTRAVENOUS

## 2013-10-02 MED ORDER — PRENATAL MULTIVITAMIN CH
1.0000 | ORAL_TABLET | Freq: Every day | ORAL | Status: DC
  Administered 2013-10-03 – 2013-10-04 (×2): 1 via ORAL
  Filled 2013-10-02 (×2): qty 1

## 2013-10-02 MED ORDER — VENLAFAXINE HCL ER 75 MG PO CP24
225.0000 mg | ORAL_CAPSULE | Freq: Every day | ORAL | Status: DC
  Administered 2013-10-02 – 2013-10-03 (×2): 225 mg via ORAL
  Filled 2013-10-02 (×3): qty 3

## 2013-10-02 MED ORDER — VITAMIN B-12 1000 MCG PO TABS
2000.0000 ug | ORAL_TABLET | Freq: Every day | ORAL | Status: DC
  Administered 2013-10-03 – 2013-10-04 (×2): 2000 ug via ORAL
  Filled 2013-10-02 (×2): qty 2

## 2013-10-02 MED ORDER — DEXTROSE 5 % IV SOLN
500.0000 mg | Freq: Once | INTRAVENOUS | Status: AC
  Administered 2013-10-02: 16:00:00 via INTRAVENOUS

## 2013-10-02 MED ORDER — ALBUTEROL SULFATE (5 MG/ML) 0.5% IN NEBU: 2.5000 mg | INHALATION_SOLUTION | RESPIRATORY_TRACT | Status: DC | PRN

## 2013-10-02 MED ORDER — CEFTRIAXONE SODIUM 1 G IJ SOLR: 1.0000 g | Freq: Once | INTRAMUSCULAR | Status: DC

## 2013-10-02 MED ORDER — HEPARIN (PORCINE) IN NACL 100-0.45 UNIT/ML-% IJ SOLN
1200.0000 [IU]/h | INTRAMUSCULAR | Status: DC
  Administered 2013-10-02: 25000 [IU] via INTRAVENOUS
  Administered 2013-10-02: 1200 [IU]/h via INTRAVENOUS
  Filled 2013-10-02: qty 250

## 2013-10-02 MED ORDER — MORPHINE SULFATE 2 MG/ML IJ SOLN
2.0000 mg | INTRAMUSCULAR | Status: DC | PRN
  Administered 2013-10-02: 2 mg via INTRAVENOUS
  Filled 2013-10-02: qty 1

## 2013-10-02 MED ORDER — CYCLOBENZAPRINE HCL 10 MG PO TABS
10.0000 mg | ORAL_TABLET | Freq: Two times a day (BID) | ORAL | Status: DC | PRN
  Administered 2013-10-02 – 2013-10-03 (×2): 10 mg via ORAL
  Filled 2013-10-02 (×2): qty 1

## 2013-10-02 MED ORDER — BIOTENE DRY MOUTH MT LIQD
15.0000 mL | Freq: Two times a day (BID) | OROMUCOSAL | Status: DC
  Administered 2013-10-02 – 2013-10-04 (×4): 15 mL via OROMUCOSAL

## 2013-10-02 NOTE — ED Notes (Signed)
Returned from CT.  HeparinGtt and IV ABX restarted per MD order.

## 2013-10-02 NOTE — ED Notes (Signed)
Patient transported to & from CT 

## 2013-10-02 NOTE — ED Notes (Signed)
Report called to Katie, RN

## 2013-10-02 NOTE — ED Provider Notes (Signed)
CSN: [id number]     Arrival date & time 10/02/13  1230 History   First MD Initiated Contact with Patient 10/02/13 1252     Chief Complaint  Patient presents with  . Fever  . Shortness of Breath  . Headache  . Cough   (Consider location/radiation/quality/duration/timing/severity/associated sxs/prior Treatment) Patient is a 67 y.o. female presenting with fever, shortness of breath, headaches, and cough.  Fever Associated symptoms: cough and headaches   Associated symptoms: no chest pain, no chills, no dysuria and no sore throat   Shortness of Breath Associated symptoms: cough, fever and headaches   Associated symptoms: no abdominal pain, no chest pain and no sore throat   Headache Associated symptoms: cough and fever   Associated symptoms: no abdominal pain and no sore throat   Cough Associated symptoms: fever, headaches and shortness of breath   Associated symptoms: no chest pain, no chills and no sore throat     67 year old female here with cough, shortness of breath, and chest pain for about 12 hours. She states that she's had a "nagging" shortness of breath for about one week. Last night she woke up in the middle the night and had a sudden onset shortness of breath and cough. This morning her cough became productive of bloody sputum. She describes the sputum as approximately 1 teaspoon each time she coughs. Her last episode of bloody sputum was about 45 minutes ago. She denies any history of blood clots but endorses left leg swelling for the last few days. She states that her legs intermittently swell, and that she has the early stages of CHF, but they are inconsistently unilateral or bilateral. She's taken furosemide in the last day with good diuresis and reduction in her swelling. She denies history of chronic lung disease and smoking. She states she had a fever of 102 last night.  She describes her chest pain as 5/10 right-sided chest pain radiating inward, worsened by deep  inspiration or cough, and alleviated by holding her breast.   Past Medical History  Diagnosis Date  . Anxiety   . Anemia   . HYPERLIPIDEMIA 05/15/2007    Qualifier: Diagnosis of  By: Janit Bern    . ADD (attention deficit disorder) 05/24/2012  . ANEMIA, MILD 06/26/2008    Qualifier: Diagnosis of  By: Janit Bern    . DIZZINESS 12/31/2009    Qualifier: Diagnosis of  By: Oswald Hillock    . GERD 12/03/2008    Qualifier: Diagnosis of  By: Freddy Jaksch    . Idiopathic scoliosis 04/19/2013  . Osteoporosis, unspecified 04/19/2013  . Other vitamin B12 deficiency anemia 10/30/2009    Qualifier: Diagnosis of  By: Floydene Flock    . Pica 12/28/2007    Qualifier: Diagnosis of  By: Janit Bern    . PALPITATIONS, RECURRENT 12/12/2009    Qualifier: Diagnosis of  By: Janit Bern    . PSTPRC STATUS, BARIATRIC SURGERY 05/15/2007    Qualifier: Diagnosis of  By: Janit Bern    . REDUCTION MAMMOPLASTY, HX OF 05/15/2007    Qualifier: Diagnosis of  By: Janit Bern    . TREMOR, ESSENTIAL 12/12/2009    Qualifier: Diagnosis of  By: Janit Bern    . Unspecified Myalgia and Myositis 05/15/2007    Centricity Description: FIBROMYALGIA Qualifier: Diagnosis of  By: Janit Bern   Centricity Description: MYALGIA Qualifier: Diagnosis of  By: Janit Bern    . Chronic back pain   .  Hypertension    Past Surgical History  Procedure Laterality Date  . Breast surgery    . Gastric bypass    . Abdominal hysterectomy    . Knee surgery     No family history on file. History  Substance Use Topics  . Smoking status: Never Smoker   . Smokeless tobacco: Not on file  . Alcohol Use: No   OB History   Grav Para Term Preterm Abortions TAB SAB Ect Mult Living                 Review of Systems  Constitutional: Positive for fever. Negative for chills and appetite change.  HENT: Negative for sore throat.   Respiratory: Positive for cough and shortness of breath.        Hemoptysis   Cardiovascular: Negative for chest pain.  Gastrointestinal: Negative for abdominal pain.  Genitourinary: Negative for dysuria.  Neurological: Positive for headaches.  All other systems reviewed and are negative.    Allergies  Review of patient's allergies indicates no known allergies.  Home Medications   Current Outpatient Rx  Name  Route  Sig  Dispense  Refill  . aspirin 81 MG tablet   Oral   Take 81 mg by mouth daily.         . Cholecalciferol (VITAMIN D3) 2000 UNITS TABS   Oral   Take 2,000 mg by mouth daily.         . clonazePAM (KLONOPIN) 0.5 MG tablet      TAKE 1 TABLET TWICE A DAY AS NEEDED FOR ANXIETY   60 tablet   0   . cyanocobalamin 2000 MCG tablet   Oral   Take 2,000 mcg by mouth daily.         . cyclobenzaprine (FLEXERIL) 10 MG tablet   Oral   Take 1 tablet (10 mg total) by mouth 2 (two) times daily as needed for muscle spasms.   60 tablet   0   . ferrous sulfate (IRON SUPPLEMENT) 325 (65 FE) MG tablet   Oral   Take 325 mg by mouth daily with breakfast.         . oxyCODONE (ROXICODONE) 15 MG immediate release tablet   Oral   Take 15 mg by mouth every 4 (four) hours as needed for pain.         . Prenatal Vit-Fe Fumarate-FA (PRENATAL MULTIVITAMIN) TABS tablet   Oral   Take 1 tablet by mouth daily at 12 noon.         . SUMAtriptan (IMITREX) 50 MG tablet      1 po  At first sign of headache, may repeat in 2 h x1   10 tablet   6   . venlafaxine XR (EFFEXOR-XR) 75 MG 24 hr capsule      TAKE 3 CAPSULES DAILY   90 capsule   5    BP 104/64  Pulse 88  Temp(Src) 99.1 F (37.3 C) (Oral)  Resp 20  Ht 5\' 5"  (1.651 m)  Wt 202 lb (91.627 kg)  BMI 33.61 kg/m2  SpO2 93% Physical Exam  Nursing note and vitals reviewed. Constitutional: She is oriented to person, place, and time. She appears well-developed and well-nourished. No distress.  HENT:  Head: Normocephalic and atraumatic.  Eyes: EOM are normal. Pupils are equal, round, and  reactive to light.  Neck: Neck supple.  Cardiovascular: Normal rate, regular rhythm and normal heart sounds.   No murmur heard. Pulmonary/Chest: Effort normal. No accessory  muscle usage. Not tachypneic. No respiratory distress. She has decreased breath sounds in the left lower field. She has no wheezes. She has no rhonchi. She has no rales.  Abdominal: Soft. Bowel sounds are normal. There is no tenderness. There is no guarding.  Musculoskeletal: She exhibits no edema.  Neurological: She is alert and oriented to person, place, and time. She has normal strength. No sensory deficit. She exhibits normal muscle tone.  Skin: Skin is warm and dry. She is not diaphoretic.  Psychiatric: She has a normal mood and affect.    ED Course  Procedures (including critical care time) Labs Review Labs Reviewed  CBC WITH DIFFERENTIAL - Abnormal; Notable for the following:    WBC 14.5 (*)    Neutrophils Relative % 82 (*)    Neutro Abs 11.8 (*)    Lymphocytes Relative 7 (*)    Monocytes Absolute 1.5 (*)    All other components within normal limits  POCT I-STAT, CHEM 8 - Abnormal; Notable for the following:    Potassium 3.0 (*)    Glucose, Bld 110 (*)    Calcium, Ion 1.05 (*)    All other components within normal limits  CULTURE, BLOOD (ROUTINE X 2)  CULTURE, BLOOD (ROUTINE X 2)  PROTIME-INR   Imaging Review Ct Angio Chest W/cm &/or Wo Cm  10/02/2013   CLINICAL DATA:  Right chest wall pain, cough, bloody sputum, fever  EXAM: CT ANGIOGRAPHY CHEST WITH CONTRAST  TECHNIQUE: Multidetector CT imaging of the chest was performed using the standard protocol during bolus administration of intravenous contrast. Multiplanar CT image reconstructions including MIPs were obtained to evaluate the vascular anatomy.  CONTRAST:  OMNIPAQUE IOHEXOL 350 MG/ML SOLN  COMPARISON:  Chest CT -07/11/2013  FINDINGS: Vascular Findings:  There is adequate opacification of the pulmonary arterial system with the main pulmonary  artery measuring 272 Hounsfield units. There are discrete filling defects within the pulmonary arterial tree to suggest pulmonary embolism. Normal caliber of the main pulmonary artery.  Normal heart size. No pericardial effusion. Normal caliber of the ascending thoracic aorta. Conventional configuration of the aortic arch. The branch vessels of the aortic arch are widely patent throughout their imaged course. No definite thoracic aortic dissection or periaortic stranding.  Review of the MIP images confirms the above findings.   ----------------------------------------------------------------------------------  Nonvascular Findings:  There is a heterogeneous nearly consolidative airspace opacity within the right middle lobe with associated air bronchograms measuring approximately 5.0 x 4.8 cm (image 58, series 6). This airspace opacity abuts the right anterior lateral chest wall. There is a trace right-sided pleural effusion and less well defined airspace opacity within the medial basilar segment of the right lower lobe (image 69, series 6). Interval development of shotty right infrahilar lymph nodes with index right infrahilar nodal conglomeration measuring approximately 1 cm in greatest short axis diameter. Scattered mediastinal lymph nodes individually not enlarged by size criteria. No left hilar adenopathy. No axillary adenopathy.  Scattered bilateral subcentimeter pulmonary nodules are unchanged with index approximately 4 mm nodule within the superior segment of the right lower lobe (image 34, series 6) approximately 6 mm nodule within the right lower lung adjacent to the right major fissure (image 47), approximately 4 mm nodule within the left upper lobe (image 38) and the approximately 3 mm nodule within the subpleural aspect of the left lower lobe (image 67). No new discrete pulmonary nodules. The central pulmonary airways are widely patent.  Limited early arterial phase evaluation of the upper abdomen  demonstrates postsurgical change of the stomach, presumably the sequela of gastric bypass surgery. Incidental note is made of a small splenule.  No acute or aggressive osseous abnormalities. Unchanged mild (approximately 25%) compression deformity involving the superior endplate of the T8 vertebral body without significant retropulsion. Normal appearance of the thyroid gland.  IMPRESSION: 1. No evidence of pulmonary embolism. 2. Heterogeneous, nearly consolidative airspace opacity within the right middle lobe worrisome for infection. A follow-up chest radiograph in 4 to 6 weeks after treatment is recommended to ensure resolution. 3. Interval development of a trace right-sided effusion and associated right hilar adenopathy, both presumably reactive in etiology. 4. Grossly unchanged scattered bilateral pulmonary nodules, stable since recently performed chest CT with the largest pulmonary nodule within the right lower lobe adjacent to the right major fissure measuring approximately 6 mm in diameter. If the patient is at high risk for bronchogenic carcinoma, follow-up chest CT at 6-12 months is recommended. If the patient is at low risk for bronchogenic carcinoma, follow-up chest CT at 12 months is recommended. This recommendation follows the consensus statement: Guidelines for Management of Small Pulmonary Nodules Detected on CT Scans: A Statement from the Fleischner Society as published in Radiology 2005;237:395-400. 5. Unchanged mild (approximately 25%) compression deformity involving the superior endplate of the T8 vertebral body.   Electronically Signed   By: Simonne Come M.D.   On: 10/02/2013 14:21    EKG Interpretation   None       MDM   1. Community acquired pneumonia   2. PE (pulmonary embolism)      67 year old female here with sudden onset dyspnea , hemoptysis, and right-sided chest pain. We'll evaluate for ACS, also investigate hemoptysis chest pain with CT angiogram. Possible etiologies  include pneumonia, necrotizing pneumonia, PE, laryngeal irritation, and lung malignancy.   Labs reveal a WBC of 14.5, potassium 3.0 CT angiography shows clear pneumonia and PE, although the read is a bit cryptic on the PE there are visible filling defects.  Blood cultures collected started on azithromycin Rocephin here as well as heparin drip.  Considering PE and CAP will admit for IV antibiotics and close obs.   Discussed wioth [facility] hospitalists who accept patient.   Murtis Sink, MD [facility] Resident, PGY-2 10/02/2013, 3:27 PM          Elenora Gamma, MD 10/02/13 [phone]

## 2013-10-02 NOTE — ED Notes (Signed)
Pt transported to [facility] via [place] and stable upon transport

## 2013-10-02 NOTE — ED Notes (Signed)
Pt reports cold symptoms x 1 week.  She developed fever today.  She reports right chest wall pain, cough and bloody sputum.

## 2013-10-02 NOTE — ED Provider Notes (Signed)
I saw and evaluated the patient, reviewed the resident's note and I agree with the findings and plan.  EKG Interpretation   None       Patient here with hemoptysis, R sided chest pain. Known DVT at this time. CT Angio shows PE and R frontal lobe pneumonia. CAP antibiotic coverage provided. Heparin initiated. No respiratory distress, R sided rhonchi, normal vitals. Has leukocytosis, admitted to hospitalist.  While awaiting admission, patient began to have a headache after starting heparin drip. Had already received heparin bolus. Patient mentating well, had immediate head CT which did not show bleed. Heparin continued. Transferred to [facility].  Dagmar Hait, MD 10/04/13 [phone]

## 2013-10-02 NOTE — ED Notes (Signed)
[facility] at bedside.  Report given to [facility], RN [facility]

## 2013-10-02 NOTE — ED Notes (Signed)
Patient transported to CT 

## 2013-10-02 NOTE — ED Notes (Signed)
Pt and family informed of plan of care and admission 

## 2013-10-02 NOTE — ED Notes (Signed)
[facility] at bedside 

## 2013-10-02 NOTE — H&P (Signed)
PATIENT DETAILS Name: [name] Age: 67 y.o. Sex: female Date of Birth: 1946/05/10 Admit Date: 10/02/2013 ZOX:WRUEAV Lowne, DO   CHIEF COMPLAINT:  Fever, cough and right-sided pleuritic chest pain since early this morning  HPI: [name] is a 67 y.o. female with a Past Medical History of mild diastolic heart failure, anxiety, depression, morbid obesity, chronic back pain who presents today with the above noted complaint. Apparently patient was in her usual state of health until early this morning, when she woke up with right-sided pleuritic chest pain. She then started developing fever, and also started having cough with rusty colored sputum. With these complaints she presented to meds in [facility], with a CT angiogram of the chest showed consolidation suggestive of pneumonia, she also was found to have leukocytosis. She was then transferred to [facility], and to try hospitalist last admit this patient for further evaluation and treatment. During my evaluation, patient seemed comfortable, was not in any distress and was very pleasant. Patient does claim to have had mild headaches today, however denies any runny nose, and diffuse myalgias. She also denies nausea, vomiting, left-sided chest pain, abdominal pain, diarrhea. There is no history of dysuria as well.  ALLERGIES:  No Known Allergies  PAST MEDICAL HISTORY: Past Medical History  Diagnosis Date  . Anxiety   . Anemia   . HYPERLIPIDEMIA 05/15/2007    Qualifier: Diagnosis of  By: Janit Bern    . ADD (attention deficit disorder) 05/24/2012  . ANEMIA, MILD 06/26/2008    Qualifier: Diagnosis of  By: Janit Bern    . DIZZINESS 12/31/2009    Qualifier: Diagnosis of  By: Oswald Hillock    . GERD 12/03/2008    Qualifier: Diagnosis of  By: Freddy Jaksch    . Idiopathic scoliosis 04/19/2013  . Osteoporosis, unspecified 04/19/2013  . Other vitamin B12 deficiency anemia 10/30/2009    Qualifier:  Diagnosis of  By: Floydene Flock    . Pica 12/28/2007    Qualifier: Diagnosis of  By: Janit Bern    . PALPITATIONS, RECURRENT 12/12/2009    Qualifier: Diagnosis of  By: Janit Bern    . PSTPRC STATUS, BARIATRIC SURGERY 05/15/2007    Qualifier: Diagnosis of  By: Janit Bern    . REDUCTION MAMMOPLASTY, HX OF 05/15/2007    Qualifier: Diagnosis of  By: Janit Bern    . TREMOR, ESSENTIAL 12/12/2009    Qualifier: Diagnosis of  By: Janit Bern    . Unspecified Myalgia and Myositis 05/15/2007    Centricity Description: FIBROMYALGIA Qualifier: Diagnosis of  By: Janit Bern   Centricity Description: MYALGIA Qualifier: Diagnosis of  By: Janit Bern    . Chronic back pain   . Hypertension     PAST SURGICAL HISTORY: Past Surgical History  Procedure Laterality Date  . Breast surgery    . Gastric bypass    . Abdominal hysterectomy    . Knee surgery      MEDICATIONS AT HOME: Prior to Admission medications   Medication Sig Start Date End Date Taking? Authorizing Provider  aspirin 81 MG tablet Take 81 mg by mouth daily.   Yes Historical Provider, MD  Cholecalciferol (VITAMIN D3) 2000 UNITS TABS Take 2,000 mg by mouth daily.   Yes Historical Provider, MD  clonazePAM (KLONOPIN) 0.5 MG tablet TAKE 1 TABLET TWICE A DAY AS NEEDED FOR ANXIETY 09/11/13  Yes Lelon Perla, DO  cyanocobalamin 2000 MCG tablet Take  2,000 mcg by mouth daily.   Yes Historical Provider, MD  cyclobenzaprine (FLEXERIL) 10 MG tablet Take 1 tablet (10 mg total) by mouth 2 (two) times daily as needed for muscle spasms. 09/25/12  Yes Grayling Congress Lowne, DO  ferrous sulfate (IRON SUPPLEMENT) 325 (65 FE) MG tablet Take 325 mg by mouth daily with breakfast.   Yes Historical Provider, MD  oxyCODONE (ROXICODONE) 15 MG immediate release tablet Take 15 mg by mouth every 4 (four) hours as needed for pain.   Yes Historical Provider, MD  Prenatal Vit-Fe Fumarate-FA (PRENATAL MULTIVITAMIN) TABS tablet Take 1 tablet by mouth  daily at 12 noon.   Yes Historical Provider, MD  SUMAtriptan (IMITREX) 50 MG tablet 1 po  At first sign of headache, may repeat in 2 h x1 03/23/13  Yes Lelon Perla, DO  venlafaxine XR (EFFEXOR-XR) 75 MG 24 hr capsule TAKE 3 CAPSULES DAILY 08/22/13  Yes Lelon Perla, DO    FAMILY HISTORY: No family history on file.  SOCIAL HISTORY:  reports that she has never smoked. She does not have any smokeless tobacco history on file. She reports that she does not drink alcohol or use illicit drugs.  REVIEW OF SYSTEMS:  Constitutional:   No  weight loss, night sweats, chills, fatigue.  HEENT:    No headaches, Difficulty swallowing,Tooth/dental problems,Sore throat,  No sneezing, itching, ear ache, nasal congestion, post nasal drip,   Cardio-vascular: No chest pain,  Orthopnea, PND, swelling in lower extremities, anasarca,  dizziness, palpitations  GI:  No heartburn, indigestion, abdominal pain, nausea, vomiting, diarrhea, change in bowel habits, loss of appetite  Resp: No shortness of breath with exertion or at rest. No wheezing.No chest wall deformity  Skin:  no rash or lesions.  GU:  no dysuria, change in color of urine, no urgency or frequency.  No flank pain.  Musculoskeletal: No joint pain or swelling.  No decreased range of motion.  No back pain.  Psych: No change in mood or affect. No depression or anxiety.  No memory loss.   PHYSICAL EXAM: Blood pressure 88/50, pulse 85, temperature 98.4 F (36.9 C), temperature source Oral, resp. rate 18, height 5\' 5"  (1.651 m), weight 94.121 kg (207 lb 8 oz), SpO2 95.00%.  General appearance :Awake, alert, not in any distress. Speech Clear. Not toxic Looking HEENT: Atraumatic and Normocephalic, pupils equally reactive to light and accomodation Neck: supple, no JVD. No cervical lymphadenopathy.  Chest:Good air entry bilaterally, few right lower lobe rales.  CVS: S1 S2 regular, no murmurs.  Abdomen: Bowel sounds present, Non tender  and not distended with no gaurding, rigidity or rebound. Extremities: B/L Lower Ext shows trace edema, both legs are warm to touch Neurology: Awake alert, and oriented X 3, CN II-XII intact, Non focal Skin:No Rash Wounds:N/A  LABS ON ADMISSION:   Recent Labs  10/02/13 1317  NA 136  K 3.0*  CL 100  GLUCOSE 110*  BUN 7  CREATININE 0.80   No results found for this basename: AST, ALT, ALKPHOS, BILITOT, PROT, ALBUMIN,  in the last 72 hours No results found for this basename: LIPASE, AMYLASE,  in the last 72 hours  Recent Labs  10/02/13 1310 10/02/13 1317  WBC 14.5*  --   NEUTROABS 11.8*  --   HGB 12.6 13.3  HCT 37.2 39.0  MCV 90.5  --   PLT 231  --    No results found for this basename: CKTOTAL, CKMB, CKMBINDEX, TROPONINI,  in the last 72  hours No results found for this basename: DDIMER,  in the last 72 hours No components found with this basename: POCBNP,    RADIOLOGIC STUDIES ON ADMISSION: Ct Head Wo Contrast  10/02/2013   CLINICAL DATA:  67 year old female with sudden onset of severe headache after heparin administration. Initial encounter.  EXAM: CT HEAD WITHOUT CONTRAST  TECHNIQUE: Contiguous axial images were obtained from the base of the skull through the vertex without intravenous contrast.  COMPARISON:  Chest CTA from 1348 hr the same day. [name] head CT without contrast 08/28/2012.  FINDINGS: Visualized paranasal sinuses and mastoids are clear. Visualized orbit soft tissues are within normal limits. Visualized scalp soft tissues are within normal limits. No acute osseous abnormality identified.  Cerebral volume is stable and within normal limits for age. No ventriculomegaly. No midline shift, mass effect, or evidence of intracranial mass lesion. Stable and normal gray-white matter differentiation. No evidence of cortically based acute infarction identified.  There is some intravascular contrast still present. Otherwise stable appearance of the major  intracranial vascular structures. No abnormal enhancement identified. No acute intracranial hemorrhage identified.  IMPRESSION: Stable and negative CT appearance of the brain.  Note that there is some residual intravascular contrast from the chest CTA at 1348 hr today.   Electronically Signed   By: Augusto Gamble M.D.   On: 10/02/2013 15:59   Ct Angio Chest W/cm &/or Wo Cm  10/02/2013   CLINICAL DATA:  Right chest wall pain, cough, bloody sputum, fever  EXAM: CT ANGIOGRAPHY CHEST WITH CONTRAST  TECHNIQUE: Multidetector CT imaging of the chest was performed using the standard protocol during bolus administration of intravenous contrast. Multiplanar CT image reconstructions including MIPs were obtained to evaluate the vascular anatomy.  CONTRAST:  OMNIPAQUE IOHEXOL 350 MG/ML SOLN  COMPARISON:  Chest CT -07/11/2013  FINDINGS: Vascular Findings:  There is adequate opacification of the pulmonary arterial system with the main pulmonary artery measuring 272 Hounsfield units. There are discrete filling defects within the pulmonary arterial tree to suggest pulmonary embolism. Normal caliber of the main pulmonary artery.  Normal heart size. No pericardial effusion. Normal caliber of the ascending thoracic aorta. Conventional configuration of the aortic arch. The branch vessels of the aortic arch are widely patent throughout their imaged course. No definite thoracic aortic dissection or periaortic stranding.  Review of the MIP images confirms the above findings.   ----------------------------------------------------------------------------------  Nonvascular Findings:  There is a heterogeneous nearly consolidative airspace opacity within the right middle lobe with associated air bronchograms measuring approximately 5.0 x 4.8 cm (image 58, series 6). This airspace opacity abuts the right anterior lateral chest wall. There is a trace right-sided pleural effusion and less well defined airspace opacity within the medial  basilar segment of the right lower lobe (image 69, series 6). Interval development of shotty right infrahilar lymph nodes with index right infrahilar nodal conglomeration measuring approximately 1 cm in greatest short axis diameter. Scattered mediastinal lymph nodes individually not enlarged by size criteria. No left hilar adenopathy. No axillary adenopathy.  Scattered bilateral subcentimeter pulmonary nodules are unchanged with index approximately 4 mm nodule within the superior segment of the right lower lobe (image 34, series 6) approximately 6 mm nodule within the right lower lung adjacent to the right major fissure (image 47), approximately 4 mm nodule within the left upper lobe (image 38) and the approximately 3 mm nodule within the subpleural aspect of the left lower lobe (image 67). No new discrete pulmonary nodules. The  central pulmonary airways are widely patent.  Limited early arterial phase evaluation of the upper abdomen demonstrates postsurgical change of the stomach, presumably the sequela of gastric bypass surgery. Incidental note is made of a small splenule.  No acute or aggressive osseous abnormalities. Unchanged mild (approximately 25%) compression deformity involving the superior endplate of the T8 vertebral body without significant retropulsion. Normal appearance of the thyroid gland.  IMPRESSION: 1. No evidence of pulmonary embolism. 2. Heterogeneous, nearly consolidative airspace opacity within the right middle lobe worrisome for infection. A follow-up chest radiograph in 4 to 6 weeks after treatment is recommended to ensure resolution. 3. Interval development of a trace right-sided effusion and associated right hilar adenopathy, both presumably reactive in etiology. 4. Grossly unchanged scattered bilateral pulmonary nodules, stable since recently performed chest CT with the largest pulmonary nodule within the right lower lobe adjacent to the right major fissure measuring approximately 6 mm in  diameter. If the patient is at high risk for bronchogenic carcinoma, follow-up chest CT at 6-12 months is recommended. If the patient is at low risk for bronchogenic carcinoma, follow-up chest CT at 12 months is recommended. This recommendation follows the consensus statement: Guidelines for Management of Small Pulmonary Nodules Detected on CT Scans: A Statement from the Fleischner Society as published in Radiology 2005;237:395-400. 5. Unchanged mild (approximately 25%) compression deformity involving the superior endplate of the T8 vertebral body.   Electronically Signed   By: Simonne Come M.D.   On: 10/02/2013 14:21   ASSESSMENT AND PLAN: Present on Admission:  . CAP (community acquired pneumonia) - Coming in with fever, cough with rusty sputum, right-sided pleuritic chest pain-CT angiogram of the chest confirms pneumonia, we'll start on empiric Rocephin and Zithromax. We will check influenza PCR, if negative we will discontinue isolation, however is positive she would need to be placed on Tamiflu as she is still within the adequate timeframe.   - Please note, CT angiogram of the chest does not show any filling defects-she was placed on a heparin infusion, as there was a question of whether there was a filling defect in the vascular tree, however upon discussion with Dr. Bartholomew Boards on call-that was a "typo"- patient does not have pulmonary embolism evident on the CT angiogram of the chest. As a result heparin infusion will be discontinued.  . Back pain - Chronic, and essentially unchanged. CT angiogram of the chest also shows a mild compression deformity of the T8 thoracic vertebra. We will continue OxyIR for moderate pain and use morphine IV for severe pain. Anterior as needed Flexeril   . ADD (attention deficit disorder) - Stable, previously was on Adderall, this is now been discontinued.   [name] DEPRESSION/ANXIETY - Continue with as needed Klonopin, continue with venlafaxine.   . Mild  diastolic dysfunction - Clinically compensated, watch closely while on IV fluids.   . Morbid obesity - Counseled regarding importance of weight loss.  . Small bilateral pulmonary nodules - Per CT chest-these have been stable when compared to recent CT of the chest. We'll need a repeat CT of the chest in 6 months.   Further plan will depend as patient's clinical course evolves and further radiologic and laboratory data become available. Patient will be monitored closely.   DVT Prophylaxis: Prophylactic Lovenox   Code Status: Full Code  Total time spent for admission equals 45 minutes.  [facility] Pager [phone]  If 7PM-7AM, please contact night-coverage [email or web address] Password [facility] 10/02/2013, 6:03 PM

## 2013-10-02 NOTE — Progress Notes (Addendum)
Addendum 11/26 1215: From IM note upon admission to [facility]: Please note, CT angiogram of the chest does not show any filling defects-she was placed on a heparin infusion, as there was a question of whether there was a filling defect in the vascular tree, however upon discussion with Dr. Bartholomew Boards on call-that was a "typo"- patient does not have pulmonary embolism evident on the CT angiogram of the chest. As a result heparin infusion will be discontinued.  Christoper Fabian, PharmD, BCPS Clinical pharmacist, pager [phone]   ANTICOAGULATION CONSULT NOTE - Initial Consult  Pharmacy Consult for Heparin Indication: pulmonary embolus  No Known Allergies  Patient Measurements: Height: 5\' 5"  (165.1 cm) Weight: 202 lb (91.627 kg) IBW/kg (Calculated) : 57 Heparin Dosing Weight: 77 kg  Vital Signs: Temp: 99.1 F (37.3 C) (11/25 1235) Temp src: Oral (11/25 1235) BP: 140/80 mmHg (11/25 1235) Pulse Rate: 104 (11/25 1235)  Labs:  Recent Labs  10/02/13 1310 10/02/13 1317  HGB 12.6 13.3  HCT 37.2 39.0  PLT 231  --   CREATININE  --  0.80    Estimated Creatinine Clearance: 78.4 ml/min (by C-G formula based on Cr of 0.8).   Medical History: Past Medical History  Diagnosis Date  . Anxiety   . Anemia   . HYPERLIPIDEMIA 05/15/2007    Qualifier: Diagnosis of  By: Janit Bern    . ADD (attention deficit disorder) 05/24/2012  . ANEMIA, MILD 06/26/2008    Qualifier: Diagnosis of  By: Janit Bern    . DIZZINESS 12/31/2009    Qualifier: Diagnosis of  By: Oswald Hillock    . GERD 12/03/2008    Qualifier: Diagnosis of  By: Freddy Jaksch    . Idiopathic scoliosis 04/19/2013  . Osteoporosis, unspecified 04/19/2013  . Other vitamin B12 deficiency anemia 10/30/2009    Qualifier: Diagnosis of  By: Floydene Flock    . Pica 12/28/2007    Qualifier: Diagnosis of  By: Janit Bern    . PALPITATIONS, RECURRENT 12/12/2009    Qualifier: Diagnosis of  By: Janit Bern    . PSTPRC  STATUS, BARIATRIC SURGERY 05/15/2007    Qualifier: Diagnosis of  By: Janit Bern    . REDUCTION MAMMOPLASTY, HX OF 05/15/2007    Qualifier: Diagnosis of  By: Janit Bern    . TREMOR, ESSENTIAL 12/12/2009    Qualifier: Diagnosis of  By: Janit Bern    . Unspecified Myalgia and Myositis 05/15/2007    Centricity Description: FIBROMYALGIA Qualifier: Diagnosis of  By: Janit Bern   Centricity Description: MYALGIA Qualifier: Diagnosis of  By: Janit Bern    . Chronic back pain   . Hypertension     Medications:  See electronic med rec  Assessment: 67 y.o. female presents with chest pain, cough and bloody sputum. CT scan is a bit confusing - vascular section states "there are discrete filling defects within the pulmonary arterial tree to suggest pulmonary embolism". However, the nonvascular findings state "no pulmonary embolism". Discussed with Dr. Ermalinda Memos and he would like to start heparin. CBC stable at baseline.  Goal of Therapy:  Heparin level 0.3-0.7 units/ml Monitor platelets by anticoagulation protocol: Yes   Plan:  1. Heparin IV bolus 5000 units 2. Heparin gtt at 1200 units/hr 3. Will f/u 6 hr heparin level 4. Daily heparin level and CBC  Christoper Fabian, PharmD, BCPS Clinical pharmacist, pager [phone] 10/02/2013,2:35 PM

## 2013-10-02 NOTE — ED Notes (Signed)
Heparin Bolus completed and Gtt initiated

## 2013-10-02 NOTE — ED Notes (Signed)
Called into room by family member.  Pt c/o rib pain and chest pain with coughing.  She also reports a sudden headache that is described as pressure, occipital radiating to neck.  She states this does not feel like a typical headache.  Pt appears sleepy but CAOx4.  Dr. Gwendolyn Grant informed and new orders received to stop Heparin Gtt and transport to CT.

## 2013-10-03 ENCOUNTER — Encounter (HOSPITAL_COMMUNITY): Payer: Self-pay | Admitting: Physician Assistant

## 2013-10-03 DIAGNOSIS — D649 Anemia, unspecified: Secondary | ICD-10-CM

## 2013-10-03 DIAGNOSIS — R1013 Epigastric pain: Secondary | ICD-10-CM

## 2013-10-03 DIAGNOSIS — M549 Dorsalgia, unspecified: Secondary | ICD-10-CM

## 2013-10-03 LAB — CBC WITH DIFFERENTIAL/PLATELET
Basophils Absolute: 0 10*3/uL (ref 0.0–0.1)
Eosinophils Absolute: 0.2 10*3/uL (ref 0.0–0.7)
HCT: 34.7 % — ABNORMAL LOW (ref 36.0–46.0)
Hemoglobin: 11.8 g/dL — ABNORMAL LOW (ref 12.0–15.0)
Lymphocytes Relative: 7 % — ABNORMAL LOW (ref 12–46)
Monocytes Absolute: 1.3 10*3/uL — ABNORMAL HIGH (ref 0.1–1.0)
Monocytes Relative: 7 % (ref 3–12)
Neutro Abs: 15 10*3/uL — ABNORMAL HIGH (ref 1.7–7.7)
RBC: 3.78 MIL/uL — ABNORMAL LOW (ref 3.87–5.11)
RDW: 14.2 % (ref 11.5–15.5)
WBC: 17.7 10*3/uL — ABNORMAL HIGH (ref 4.0–10.5)

## 2013-10-03 LAB — COMPREHENSIVE METABOLIC PANEL
ALT: 5 U/L (ref 0–35)
AST: 13 U/L (ref 0–37)
BUN: 14 mg/dL (ref 6–23)
CO2: 25 mEq/L (ref 19–32)
Calcium: 8.2 mg/dL — ABNORMAL LOW (ref 8.4–10.5)
Chloride: 103 mEq/L (ref 96–112)
Creatinine, Ser: 0.84 mg/dL (ref 0.50–1.10)
GFR calc Af Amer: 83 mL/min — ABNORMAL LOW (ref >90)
GFR calc non Af Amer: 71 mL/min — ABNORMAL LOW (ref >90)
Glucose, Bld: 104 mg/dL — ABNORMAL HIGH (ref 70–99)
Sodium: 140 mEq/L (ref 135–145)
Total Bilirubin: 0.7 mg/dL (ref 0.3–1.2)

## 2013-10-03 LAB — HIV ANTIBODY (ROUTINE TESTING W REFLEX): HIV: NONREACTIVE

## 2013-10-03 LAB — INFLUENZA PANEL BY PCR (TYPE A & B)
H1N1 flu by pcr: NOT DETECTED
Influenza A By PCR: NEGATIVE

## 2013-10-03 MED ORDER — LEVOFLOXACIN 750 MG PO TABS: 750.0000 mg | ORAL_TABLET | Freq: Every day | ORAL | Status: DC

## 2013-10-03 MED ORDER — POTASSIUM CHLORIDE CRYS ER 20 MEQ PO TBCR
40.0000 meq | EXTENDED_RELEASE_TABLET | Freq: Once | ORAL | Status: AC
  Administered 2013-10-03: 40 meq via ORAL
  Filled 2013-10-03: qty 2

## 2013-10-03 MED ORDER — SUMATRIPTAN SUCCINATE 50 MG PO TABS
50.0000 mg | ORAL_TABLET | ORAL | Status: DC | PRN
  Administered 2013-10-03 – 2013-10-04 (×2): 50 mg via ORAL
  Filled 2013-10-03 (×2): qty 1

## 2013-10-03 NOTE — Progress Notes (Signed)
Utilization review completed. Lora Chavers, RN, BSN. 

## 2013-10-03 NOTE — Discharge Summary (Addendum)
Physician Discharge Summary  MERIAM CHOJNOWSKI WNU:[id number] DOB: 05/02/1946 DOA: 10/02/2013  PCP: Loreen Freud, DO  Admit date: 10/02/2013 Discharge date: 10/04/2013  Time spent: 45 minutes  Recommendations for Outpatient Follow-up:  1. Follow up chest xray in 4 - 6 weeks to ensure resolution of pneumonia 2. Chest CT in 6 - 12 month to monitor pulmonary nodules.  Discharge Diagnoses:  Principal Problem:   CAP (community acquired pneumonia) Active Problems:   Morbid obesity   DEPRESSION/ANXIETY   ADD (attention deficit disorder)   Mild diastolic dysfunction   Back pain   Discharge Condition: stable.  Diet recommendation: Heart Healthy  Filed Weights   10/02/13 1235 10/02/13 1740  Weight: 91.627 kg (202 lb) 94.121 kg (207 lb 8 oz)    History of present illness:  [name] is a 67 y.o. female with a Past Medical History of mild diastolic heart failure, anxiety, depression, morbid obesity, chronic back pain who presented with fever, cough and right sided chest pain. Apparently patient was in her usual state of health until early morning 11/12, when she woke up with right-sided pleuritic chest pain. She then started developing fever, and also started having cough with rusty colored sputum. With these complaints she presented to meds in [facility], with a CT angiogram of the chest showed consolidation suggestive of pneumonia, she also was found to have leukocytosis. She was then transferred to [facility], and to try hospitalist last admit this patient for further evaluation and treatment.   Hospital Course:   Right middle lobe community acquired pneumonia with hypoxia  Blood cultures show no growth to date, HIV, Urine Legionella antigen, influenza PCR were negative. Patient was treated with Azithromycin and Rocephin IV and changed to Levaquin at d/c for a total of 7 days of therapy Recommend CXR in 4-6 weeks for ensure resolution of pna.  Multiple lung nodules  Largest  is in right lower lobe adjacent to the right major fissure measuring approximately 6 mm in diameter  Will need follow up CT chest in 6-12 months.  Patient has no family history of lung cancer. She is not a smoker but has had 2nd hand smoke exposure.   Hypertension  Patient has had episodes of relative hypotension during this admission  Will monitor dosing of chronic narcotics, benzodiazepines and antispasmodics.   Multiple other co-morbidities including fibromyalgia, ADD, and chronic back pain were quiet and stable during this admission. Home medications previous home were continued.    Discharge Exam: Filed Vitals:   10/04/13 0552  BP: 114/75  Pulse: 96  Temp: 98.5 F (36.9 C)  Resp: 18   General: A&O, NAD, Lying comfortably in bed Cardiovascular: RRR, no murmurs, rubs or gallops, no lower extremity edema  Respiratory: CTA, no wheeze, crackles, or rales. No increased work of breathing.  Abdomen: Soft, non-tender, non-distended, + bowel sounds, no masses  Musculoskeletal: Able to move all 4 extremities, 5/5 strength in each   Discharge Instructions      Discharge Orders   Future Orders Complete By Expires   Diet - low sodium heart healthy  As directed    Increase activity slowly  As directed        Medication List         aspirin 81 MG tablet  Take 81 mg by mouth daily.     clonazePAM 0.5 MG tablet  Commonly known as:  KLONOPIN  TAKE 1 TABLET TWICE A DAY AS NEEDED FOR ANXIETY  cyanocobalamin 2000 MCG tablet  Take 2,000 mcg by mouth daily.     cyclobenzaprine 10 MG tablet  Commonly known as:  FLEXERIL  Take 1 tablet (10 mg total) by mouth 2 (two) times daily as needed for muscle spasms.     IRON SUPPLEMENT 325 (65 FE) MG tablet  Generic drug:  ferrous sulfate  Take 325 mg by mouth daily with breakfast.     levofloxacin 750 MG tablet  Commonly known as:  LEVAQUIN  Take 1 tablet (750 mg total) by mouth daily.  Start taking on:  10/05/2013     oxyCODONE  15 MG immediate release tablet  Commonly known as:  ROXICODONE  Take 15 mg by mouth every 4 (four) hours as needed for pain.     prenatal multivitamin Tabs tablet  Take 1 tablet by mouth daily at 12 noon.     SUMAtriptan 50 MG tablet  Commonly known as:  IMITREX  1 po  At first sign of headache, may repeat in 2 h x1     venlafaxine XR 75 MG 24 hr capsule  Commonly known as:  EFFEXOR-XR  TAKE 3 CAPSULES DAILY     Vitamin D3 2000 UNITS Tabs  Take 2,000 mg by mouth daily.       No Known Allergies Follow-up Information   Follow up with Loreen Freud, DO. Schedule an appointment as soon as possible for a visit in 2 weeks.   Specialty:  Family Medicine   Contact information:   [phone] [address] [facility] [address] [place] [address] [phone]        The results of significant diagnostics from this hospitalization (including imaging, microbiology, ancillary and laboratory) are listed below for reference.    Significant Diagnostic Studies: Ct Head Wo Contrast  10/02/2013   CLINICAL DATA:  67 year old female with sudden onset of severe headache after heparin administration. Initial encounter.  EXAM: CT HEAD WITHOUT CONTRAST  TECHNIQUE: Contiguous axial images were obtained from the base of the skull through the vertex without intravenous contrast.  COMPARISON:  Chest CTA from 1348 hr the same day. [facility] head CT without contrast 08/28/2012.  FINDINGS: Visualized paranasal sinuses and mastoids are clear. Visualized orbit soft tissues are within normal limits. Visualized scalp soft tissues are within normal limits. No acute osseous abnormality identified.  Cerebral volume is stable and within normal limits for age. No ventriculomegaly. No midline shift, mass effect, or evidence of intracranial mass lesion. Stable and normal gray-white matter differentiation. No evidence of cortically based acute infarction identified.  There is some intravascular  contrast still present. Otherwise stable appearance of the major intracranial vascular structures. No abnormal enhancement identified. No acute intracranial hemorrhage identified.  IMPRESSION: Stable and negative CT appearance of the brain.  Note that there is some residual intravascular contrast from the chest CTA at 1348 hr today.   Electronically Signed   By: Augusto Gamble M.D.   On: 10/02/2013 15:59   Ct Angio Chest W/cm &/or Wo Cm  10/02/2013   ADDENDUM REPORT: 10/02/2013 18:23  ADDENDUM: Original report by Dr Grace Isaac.  Addendum by Dr. Rito Ehrlich.  Dictation error in the body of the original report. The 2nd sentence of the vascular findings section should read:  "There are NO discrete filling defects within the pulmonary arterial tree to suggest pulmonary embolism."  As correctly noted in the impression, there is no evidence of pulmonary embolism.   Electronically Signed   By: Roselie Awkward.D.  On: 10/02/2013 18:23   10/02/2013   CLINICAL DATA:  Right chest wall pain, cough, bloody sputum, fever  EXAM: CT ANGIOGRAPHY CHEST WITH CONTRAST  TECHNIQUE: Multidetector CT imaging of the chest was performed using the standard protocol during bolus administration of intravenous contrast. Multiplanar CT image reconstructions including MIPs were obtained to evaluate the vascular anatomy.  CONTRAST:  OMNIPAQUE IOHEXOL 350 MG/ML SOLN  COMPARISON:  Chest CT -07/11/2013  FINDINGS: Vascular Findings:  There is adequate opacification of the pulmonary arterial system with the main pulmonary artery measuring 272 Hounsfield units. There are discrete filling defects within the pulmonary arterial tree to suggest pulmonary embolism. Normal caliber of the main pulmonary artery.  Normal heart size. No pericardial effusion. Normal caliber of the ascending thoracic aorta. Conventional configuration of the aortic arch. The branch vessels of the aortic arch are widely patent throughout their imaged course. No definite thoracic  aortic dissection or periaortic stranding.  Review of the MIP images confirms the above findings.   ----------------------------------------------------------------------------------  Nonvascular Findings:  There is a heterogeneous nearly consolidative airspace opacity within the right middle lobe with associated air bronchograms measuring approximately 5.0 x 4.8 cm (image 58, series 6). This airspace opacity abuts the right anterior lateral chest wall. There is a trace right-sided pleural effusion and less well defined airspace opacity within the medial basilar segment of the right lower lobe (image 69, series 6). Interval development of shotty right infrahilar lymph nodes with index right infrahilar nodal conglomeration measuring approximately 1 cm in greatest short axis diameter. Scattered mediastinal lymph nodes individually not enlarged by size criteria. No left hilar adenopathy. No axillary adenopathy.  Scattered bilateral subcentimeter pulmonary nodules are unchanged with index approximately 4 mm nodule within the superior segment of the right lower lobe (image 34, series 6) approximately 6 mm nodule within the right lower lung adjacent to the right major fissure (image 47), approximately 4 mm nodule within the left upper lobe (image 38) and the approximately 3 mm nodule within the subpleural aspect of the left lower lobe (image 67). No new discrete pulmonary nodules. The central pulmonary airways are widely patent.  Limited early arterial phase evaluation of the upper abdomen demonstrates postsurgical change of the stomach, presumably the sequela of gastric bypass surgery. Incidental note is made of a small splenule.  No acute or aggressive osseous abnormalities. Unchanged mild (approximately 25%) compression deformity involving the superior endplate of the T8 vertebral body without significant retropulsion. Normal appearance of the thyroid gland.  IMPRESSION: 1. No evidence of pulmonary embolism. 2.  Heterogeneous, nearly consolidative airspace opacity within the right middle lobe worrisome for infection. A follow-up chest radiograph in 4 to 6 weeks after treatment is recommended to ensure resolution. 3. Interval development of a trace right-sided effusion and associated right hilar adenopathy, both presumably reactive in etiology. 4. Grossly unchanged scattered bilateral pulmonary nodules, stable since recently performed chest CT with the largest pulmonary nodule within the right lower lobe adjacent to the right major fissure measuring approximately 6 mm in diameter. If the patient is at high risk for bronchogenic carcinoma, follow-up chest CT at 6-12 months is recommended. If the patient is at low risk for bronchogenic carcinoma, follow-up chest CT at 12 months is recommended. This recommendation follows the consensus statement: Guidelines for Management of Small Pulmonary Nodules Detected on CT Scans: A Statement from the Fleischner Society as published in Radiology 2005;237:395-400. 5. Unchanged mild (approximately 25%) compression deformity involving the superior endplate of the T8 vertebral body.  Electronically Signed: By: Simonne Come M.D. On: 10/02/2013 14:21    Microbiology: Recent Results (from the past 240 hour(s))  CULTURE, BLOOD (ROUTINE X 2)     Status: None   Collection Time    10/02/13  3:00 PM      Result Value Range Status   Specimen Description BLOOD L ARM   Final   Special Requests Normal BOTTLES DRAWN AEROBIC AND ANAEROBIC 10 CC   Final   Culture  Setup Time     Final   Value: 10/02/2013 20:51     Performed at [facility]   Culture     Final   Value:        BLOOD CULTURE RECEIVED NO GROWTH TO DATE CULTURE WILL BE HELD FOR 5 DAYS BEFORE ISSUING A FINAL NEGATIVE REPORT     Performed at [facility]   Report Status PENDING   Incomplete  CULTURE, BLOOD (ROUTINE X 2)     Status: None   Collection Time    10/02/13  3:10 PM      Result Value Range Status    Specimen Description BLOOD R ARM   Final   Special Requests Normal BOTTLES DRAWN AEROBIC AND ANAEROBIC 10 CC   Final   Culture  Setup Time     Final   Value: 10/02/2013 20:51     Performed at [facility]   Culture     Final   Value:        BLOOD CULTURE RECEIVED NO GROWTH TO DATE CULTURE WILL BE HELD FOR 5 DAYS BEFORE ISSUING A FINAL NEGATIVE REPORT     Performed at [facility]   Report Status PENDING   Incomplete  CULTURE, BLOOD (ROUTINE X 2)     Status: None   Collection Time    10/02/13  9:00 PM      Result Value Range Status   Specimen Description BLOOD RIGHT ARM   Final   Special Requests BOTTLES DRAWN AEROBIC AND ANAEROBIC 10CC   Final   Culture  Setup Time     Final   Value: 10/03/2013 01:08     Performed at [facility]   Culture     Final   Value:        BLOOD CULTURE RECEIVED NO GROWTH TO DATE CULTURE WILL BE HELD FOR 5 DAYS BEFORE ISSUING A FINAL NEGATIVE REPORT     Performed at [facility]   Report Status PENDING   Incomplete  CULTURE, BLOOD (ROUTINE X 2)     Status: None   Collection Time    10/02/13  9:05 PM      Result Value Range Status   Specimen Description BLOOD RIGHT HAND   Final   Special Requests BOTTLES DRAWN AEROBIC AND ANAEROBIC 10CC   Final   Culture  Setup Time     Final   Value: 10/03/2013 01:07     Performed at [facility]   Culture     Final   Value:        BLOOD CULTURE RECEIVED NO GROWTH TO DATE CULTURE WILL BE HELD FOR 5 DAYS BEFORE ISSUING A FINAL NEGATIVE REPORT     Performed at [facility]   Report Status PENDING   Incomplete     Labs: Basic Metabolic Panel:  Recent Labs Lab 10/02/13 1317 10/02/13 2105 10/03/13 0548  NA 136  --  140  K 3.0*  --  3.0*  CL  100  --  103  CO2  --   --  25  GLUCOSE 110*  --  104*  BUN 7  --  14  CREATININE 0.80 0.74 0.84  CALCIUM  --   --  8.2*   Liver Function Tests:  Recent Labs Lab 10/03/13 0548  AST 13  ALT 5  ALKPHOS 82  BILITOT 0.7   PROT 5.7*  ALBUMIN 2.6*   CBC:  Recent Labs Lab 10/02/13 1310 10/02/13 1317 10/02/13 2105 10/03/13 0548  WBC 14.5*  --  10.9* 17.7*  NEUTROABS 11.8*  --   --  15.0*  HGB 12.6 13.3 12.4 11.8*  HCT 37.2 39.0 34.3* 34.7*  MCV 90.5  --  89.3 91.8  PLT 231  --  200 201    Signed:  Maygen Sirico A  [facility] 10/04/2013, 10:04 AM

## 2013-10-03 NOTE — Care Management Note (Unsigned)
    Page 1 of 1   10/03/2013     4:11:28 PM   CARE MANAGEMENT NOTE 10/03/2013  Patient:  [name], [name]   Account Number:  [id number]  Date Initiated:  10/03/2013  Documentation initiated by:  Letha Cape  Subjective/Objective Assessment:   dx cap  admit- lives with spouse.     Action/Plan:   Anticipated DC Date:  10/05/2013   Anticipated DC Plan:  HOME/SELF CARE         Choice offered to / List presented to:             Status of service:  In process, will continue to follow Medicare Important Message given?   (If response is "NO", the following Medicare IM given date fields will be blank) Date Medicare IM given:   Date Additional Medicare IM given:    Discharge Disposition:    Per UR Regulation:  Reviewed for med. necessity/level of care/duration of stay  If discussed at Long Length of Stay Meetings, dates discussed:    Comments:  10/03/13 16:08 Letha Cape RN, BSN  [phone] patient lives with spouse,  No needs anticipated.

## 2013-10-03 NOTE — Progress Notes (Signed)
[facility] PROGRESS NOTE  [name] ZOX:[id number] DOB: 05-Feb-1946 DOA: 10/02/2013 PCP: Loreen Freud, DO  Assessment/Plan:  Right middle lobe community acquired pneumonia with hypoxia Blood cultures, HIV, Urine Legionella antigen in process Urine for Strep pneumoniae negative. Patient started on Azith and Rocephin in the emergency department.  Will continue Patient anxious for discharge.  Hopeful to d/c 11/27  Multiple lung nodules Largest is in right lower lobe adjacent to the right major fissure measuring approximately 6 mm in diameter Will need follow up CT chest in 6-12 months. Patient has no family history of lung cancer. She is not a smoker but has had 2nd hand smoke exposure.  Hypertension  Patient has had episodes of relative hypotension during this admission Will monitor dosing of chronic narcotics, benzodiazepines and antispasmodics.  Multiple other co-morbidities including fibromyalgia, ADD, and chronic back pain are quiet and stable during this admission.  Home medications have been continued.    DVT Prophylaxis:  lovenox  Code Status: full Family Communication: husband at bedside Disposition Plan: inpatient.   Antibiotics: Azith / Rocephin    HPI/Subjective: Patient complaining of coughing up blood tinged sputum, also of right sided chest pain.  Yet she is anxious for d/c to home.  Objective: Filed Vitals:   10/02/13 1740 10/02/13 1841 10/02/13 2147 10/03/13 0456  BP: 88/50 100/62 93/62 97/56   Pulse: 85 85 97 89  Temp: 98.4 F (36.9 C)  98.3 F (36.8 C) 97.8 F (36.6 C)  TempSrc: Oral  Oral Oral  Resp: 18  20 18   Height: 5\' 5"  (1.651 m)     Weight: 94.121 kg (207 lb 8 oz)     SpO2: 95%  94% 95%    Intake/Output Summary (Last 24 hours) at 10/03/13 1126 Last data filed at 10/03/13 0545  Gross per 24 hour  Intake   1470 ml  Output      0 ml  Net   1470 ml   Filed Weights   10/02/13 1235 10/02/13 1740  Weight: 91.627 kg (202 lb)  94.121 kg (207 lb 8 oz)    Exam:   General:  A&O, NAD, Lying comfortably in bed,  Nasal canula in place  Cardiovascular: RRR, no murmurs, rubs or gallops, no lower extremity edema  Respiratory: CTA, no wheeze, crackles, or rales.  No increased work of breathing.  Abdomen: Soft, non-tender, non-distended, + bowel sounds, no masses  Musculoskeletal: Able to move all 4 extremities, 5/5 strength in each  Data Reviewed: Basic Metabolic Panel:  Recent Labs Lab 10/02/13 1317 10/02/13 2105 10/03/13 0548  NA 136  --  140  K 3.0*  --  3.0*  CL 100  --  103  CO2  --   --  25  GLUCOSE 110*  --  104*  BUN 7  --  14  CREATININE 0.80 0.74 0.84  CALCIUM  --   --  8.2*   Liver Function Tests:  Recent Labs Lab 10/03/13 0548  AST 13  ALT 5  ALKPHOS 82  BILITOT 0.7  PROT 5.7*  ALBUMIN 2.6*   CBC:  Recent Labs Lab 10/02/13 1310 10/02/13 1317 10/02/13 2105 10/03/13 0548  WBC 14.5*  --  10.9* 17.7*  NEUTROABS 11.8*  --   --  15.0*  HGB 12.6 13.3 12.4 11.8*  HCT 37.2 39.0 34.3* 34.7*  MCV 90.5  --  89.3 91.8  PLT 231  --  200 201     Recent Results (from the past 240 hour(s))  CULTURE, BLOOD (ROUTINE X 2)     Status: None   Collection Time    10/02/13  3:00 PM      Result Value Range Status   Specimen Description BLOOD L ARM   Final   Special Requests Normal BOTTLES DRAWN AEROBIC AND ANAEROBIC 10 CC   Final   Culture  Setup Time     Final   Value: 10/02/2013 20:51     Performed at [facility]   Culture     Final   Value:        BLOOD CULTURE RECEIVED NO GROWTH TO DATE CULTURE WILL BE HELD FOR 5 DAYS BEFORE ISSUING A FINAL NEGATIVE REPORT     Performed at [facility]   Report Status PENDING   Incomplete  CULTURE, BLOOD (ROUTINE X 2)     Status: None   Collection Time    10/02/13  3:10 PM      Result Value Range Status   Specimen Description BLOOD R ARM   Final   Special Requests Normal BOTTLES DRAWN AEROBIC AND ANAEROBIC 10 CC   Final    Culture  Setup Time     Final   Value: 10/02/2013 20:51     Performed at [facility]   Culture     Final   Value:        BLOOD CULTURE RECEIVED NO GROWTH TO DATE CULTURE WILL BE HELD FOR 5 DAYS BEFORE ISSUING A FINAL NEGATIVE REPORT     Performed at [facility]   Report Status PENDING   Incomplete     Studies: Ct Head Wo Contrast  10/02/2013   CLINICAL DATA:  68 year old female with sudden onset of severe headache after heparin administration. Initial encounter.  EXAM: CT HEAD WITHOUT CONTRAST  TECHNIQUE: Contiguous axial images were obtained from the base of the skull through the vertex without intravenous contrast.  COMPARISON:  Chest CTA from 1348 hr the same day. [facility] head CT without contrast 08/28/2012.  FINDINGS: Visualized paranasal sinuses and mastoids are clear. Visualized orbit soft tissues are within normal limits. Visualized scalp soft tissues are within normal limits. No acute osseous abnormality identified.  Cerebral volume is stable and within normal limits for age. No ventriculomegaly. No midline shift, mass effect, or evidence of intracranial mass lesion. Stable and normal gray-white matter differentiation. No evidence of cortically based acute infarction identified.  There is some intravascular contrast still present. Otherwise stable appearance of the major intracranial vascular structures. No abnormal enhancement identified. No acute intracranial hemorrhage identified.  IMPRESSION: Stable and negative CT appearance of the brain.  Note that there is some residual intravascular contrast from the chest CTA at 1348 hr today.   Electronically Signed   By: Augusto Gamble M.D.   On: 10/02/2013 15:59   Ct Angio Chest W/cm &/or Wo Cm  10/02/2013   ADDENDUM REPORT: 10/02/2013 18:23  ADDENDUM: Original report by Dr Grace Isaac.  Addendum by Dr. Rito Ehrlich.  Dictation error in the body of the original report. The 2nd sentence of the vascular findings section  should read:  "There are NO discrete filling defects within the pulmonary arterial tree to suggest pulmonary embolism."  As correctly noted in the impression, there is no evidence of pulmonary embolism.   Electronically Signed   By: Charline Bills M.D.   On: 10/02/2013 18:23   10/02/2013   CLINICAL DATA:  Right chest wall pain, cough, bloody sputum, fever  EXAM: CT  ANGIOGRAPHY CHEST WITH CONTRAST  TECHNIQUE: Multidetector CT imaging of the chest was performed using the standard protocol during bolus administration of intravenous contrast. Multiplanar CT image reconstructions including MIPs were obtained to evaluate the vascular anatomy.  CONTRAST:  OMNIPAQUE IOHEXOL 350 MG/ML SOLN  COMPARISON:  Chest CT -07/11/2013  FINDINGS: Vascular Findings:  There is adequate opacification of the pulmonary arterial system with the main pulmonary artery measuring 272 Hounsfield units. There are discrete filling defects within the pulmonary arterial tree to suggest pulmonary embolism. Normal caliber of the main pulmonary artery.  Normal heart size. No pericardial effusion. Normal caliber of the ascending thoracic aorta. Conventional configuration of the aortic arch. The branch vessels of the aortic arch are widely patent throughout their imaged course. No definite thoracic aortic dissection or periaortic stranding.  Review of the MIP images confirms the above findings.   ----------------------------------------------------------------------------------  Nonvascular Findings:  There is a heterogeneous nearly consolidative airspace opacity within the right middle lobe with associated air bronchograms measuring approximately 5.0 x 4.8 cm (image 58, series 6). This airspace opacity abuts the right anterior lateral chest wall. There is a trace right-sided pleural effusion and less well defined airspace opacity within the medial basilar segment of the right lower lobe (image 69, series 6). Interval development of shotty right  infrahilar lymph nodes with index right infrahilar nodal conglomeration measuring approximately 1 cm in greatest short axis diameter. Scattered mediastinal lymph nodes individually not enlarged by size criteria. No left hilar adenopathy. No axillary adenopathy.  Scattered bilateral subcentimeter pulmonary nodules are unchanged with index approximately 4 mm nodule within the superior segment of the right lower lobe (image 34, series 6) approximately 6 mm nodule within the right lower lung adjacent to the right major fissure (image 47), approximately 4 mm nodule within the left upper lobe (image 38) and the approximately 3 mm nodule within the subpleural aspect of the left lower lobe (image 67). No new discrete pulmonary nodules. The central pulmonary airways are widely patent.  Limited early arterial phase evaluation of the upper abdomen demonstrates postsurgical change of the stomach, presumably the sequela of gastric bypass surgery. Incidental note is made of a small splenule.  No acute or aggressive osseous abnormalities. Unchanged mild (approximately 25%) compression deformity involving the superior endplate of the T8 vertebral body without significant retropulsion. Normal appearance of the thyroid gland.  IMPRESSION: 1. No evidence of pulmonary embolism. 2. Heterogeneous, nearly consolidative airspace opacity within the right middle lobe worrisome for infection. A follow-up chest radiograph in 4 to 6 weeks after treatment is recommended to ensure resolution. 3. Interval development of a trace right-sided effusion and associated right hilar adenopathy, both presumably reactive in etiology. 4. Grossly unchanged scattered bilateral pulmonary nodules, stable since recently performed chest CT with the largest pulmonary nodule within the right lower lobe adjacent to the right major fissure measuring approximately 6 mm in diameter. If the patient is at high risk for bronchogenic carcinoma, follow-up chest CT at 6-12  months is recommended. If the patient is at low risk for bronchogenic carcinoma, follow-up chest CT at 12 months is recommended. This recommendation follows the consensus statement: Guidelines for Management of Small Pulmonary Nodules Detected on CT Scans: A Statement from the Fleischner Society as published in Radiology 2005;237:395-400. 5. Unchanged mild (approximately 25%) compression deformity involving the superior endplate of the T8 vertebral body.  Electronically Signed: By: Simonne Come M.D. On: 10/02/2013 14:21    Scheduled Meds: . antiseptic oral rinse  15  mL Mouth Rinse BID  . aspirin  81 mg Oral Daily  . azithromycin  500 mg Intravenous Q24H  . cefTRIAXone (ROCEPHIN)  IV  1 g Intravenous Q24H  . cholecalciferol  2,000 Units Oral Daily  . enoxaparin (LOVENOX) injection  40 mg Subcutaneous Q24H  . ferrous sulfate  325 mg Oral Q breakfast  . potassium chloride  40 mEq Oral Once  . prenatal multivitamin  1 tablet Oral Q1200  . venlafaxine XR  225 mg Oral QHS  . cyanocobalamin  2,000 mcg Oral Daily   Continuous Infusions:   Principal Problem:   CAP (community acquired pneumonia) Active Problems:   Morbid obesity   DEPRESSION/ANXIETY   ADD (attention deficit disorder)   Mild diastolic dysfunction   Back pain    [place]ley [place] [facility] Pager [phone]. If 7PM-7AM, please contact night-coverage at [email or web address], password [facility] 10/03/2013, 11:26 AM  LOS: 1 day

## 2013-10-03 NOTE — Progress Notes (Signed)
Addendum  Patient seen and examined, chart and data base reviewed.  I agree with the above assessment and plan.  For full details please see Mrs. Algis Downs PA note.  CAP on Rocephin and Azithromycin.   Clint Lipps, MD [facility] Pager: [phone] 10/03/2013, 2:32 PM

## 2013-10-03 NOTE — Progress Notes (Signed)
Ambulated with pt from room to nurse's station (about 29ft) while on 2L [place].  Oxygen saturations maintained at 97% and HR 118.  Turned oxygen down to 1L and finally no oxygen and oxygen saturations still maintained at 96-98% with a HR of 118-120.  Pt denied any feelings of lightheadedness or SOB.

## 2013-10-04 DIAGNOSIS — M4120 Other idiopathic scoliosis, site unspecified: Secondary | ICD-10-CM

## 2013-10-04 DIAGNOSIS — I10 Essential (primary) hypertension: Secondary | ICD-10-CM

## 2013-10-04 DIAGNOSIS — G252 Other specified forms of tremor: Secondary | ICD-10-CM

## 2013-10-04 DIAGNOSIS — G250 Essential tremor: Secondary | ICD-10-CM

## 2013-10-04 MED ORDER — POTASSIUM CHLORIDE CRYS ER 20 MEQ PO TBCR
40.0000 meq | EXTENDED_RELEASE_TABLET | Freq: Once | ORAL | Status: AC
  Administered 2013-10-04: 08:00:00 40 meq via ORAL
  Filled 2013-10-04: qty 2

## 2013-10-04 MED ORDER — DEXTROSE 5 % IV SOLN
500.0000 mg | INTRAVENOUS | Status: DC
  Filled 2013-10-04: qty 500

## 2013-10-04 MED ORDER — DEXTROSE 5 % IV SOLN
1.0000 g | INTRAVENOUS | Status: DC
  Filled 2013-10-04: qty 10

## 2013-10-04 MED ORDER — DEXTROSE 5 % IV SOLN
1.0000 g | INTRAVENOUS | Status: DC
  Administered 2013-10-04: 1 g via INTRAVENOUS
  Filled 2013-10-04: qty 10

## 2013-10-04 MED ORDER — DEXTROSE 5 % IV SOLN
500.0000 mg | INTRAVENOUS | Status: DC
  Administered 2013-10-04: 11:00:00 500 mg via INTRAVENOUS
  Filled 2013-10-04: qty 500

## 2013-10-08 LAB — CULTURE, BLOOD (ROUTINE X 2)
Culture: NO GROWTH
Special Requests: NORMAL

## 2013-10-09 LAB — CULTURE, BLOOD (ROUTINE X 2)
Culture: NO GROWTH
Culture: NO GROWTH

## 2013-10-15 ENCOUNTER — Encounter: Payer: Self-pay | Admitting: Family Medicine

## 2013-10-15 ENCOUNTER — Ambulatory Visit (INDEPENDENT_AMBULATORY_CARE_PROVIDER_SITE_OTHER): Payer: Medicare PPO | Admitting: Family Medicine

## 2013-10-15 VITALS — BP 124/70 | HR 93 | Temp 98.0°F | Wt 191.0 lb

## 2013-10-15 DIAGNOSIS — Z23 Encounter for immunization: Secondary | ICD-10-CM

## 2013-10-15 DIAGNOSIS — F988 Other specified behavioral and emotional disorders with onset usually occurring in childhood and adolescence: Secondary | ICD-10-CM

## 2013-10-15 DIAGNOSIS — J189 Pneumonia, unspecified organism: Secondary | ICD-10-CM

## 2013-10-15 DIAGNOSIS — E669 Obesity, unspecified: Secondary | ICD-10-CM | POA: Insufficient documentation

## 2013-10-15 DIAGNOSIS — R918 Other nonspecific abnormal finding of lung field: Secondary | ICD-10-CM | POA: Insufficient documentation

## 2013-10-15 DIAGNOSIS — F411 Generalized anxiety disorder: Secondary | ICD-10-CM

## 2013-10-15 MED ORDER — LISDEXAMFETAMINE DIMESYLATE 30 MG PO CAPS: 30.0000 mg | ORAL_CAPSULE | Freq: Every day | ORAL | Status: DC

## 2013-10-15 MED ORDER — CLONAZEPAM 0.5 MG PO TABS: ORAL_TABLET | ORAL | Status: DC

## 2013-10-15 NOTE — Patient Instructions (Signed)

## 2013-10-15 NOTE — Progress Notes (Signed)
Pre visit review using our clinic review tool, if applicable. No additional management support is needed unless otherwise documented below in the visit note. 

## 2013-10-15 NOTE — Assessment & Plan Note (Signed)
Repeat ct in 6 months. 

## 2013-10-15 NOTE — Assessment & Plan Note (Signed)
Recheck cxr in January rto sooner prn

## 2013-10-15 NOTE — Progress Notes (Signed)
   Subjective:    Patient ID: [name], female    DOB: June 01, 1946, 67 y.o.   MRN: [id number]  HPI Pt here to f/u pneumonia. She was D/C 'd on thanksgiving day and is still tired but otherwise is feeling good.   Pt has finished abx.      Review of Systems    as above Objective:   Physical Exam BP 124/70  Pulse 93  Temp(Src) 98 F (36.7 C) (Oral)  Wt 191 lb (86.637 kg)  SpO2 96% General appearance: alert, cooperative, appears stated age and no distress Nose: Nares normal. Septum midline. Mucosa normal. No drainage or sinus tenderness. Throat: lips, mucosa, and tongue normal; teeth and gums normal Neck: no adenopathy, no carotid bruit, no JVD, supple, symmetrical, trachea midline and thyroid not enlarged, symmetric, no tenderness/mass/nodules Lungs: clear to auscultation bilaterally Heart: S1, S2 normal       Assessment & Plan:

## 2013-11-12 ENCOUNTER — Other Ambulatory Visit: Payer: Self-pay | Admitting: Family Medicine

## 2013-11-12 NOTE — Telephone Encounter (Signed)
Last seen and filled 10/15/13 #60.  UDS 07/10/13 Low risk Please advise     KP

## 2013-11-13 ENCOUNTER — Other Ambulatory Visit: Payer: Self-pay | Admitting: *Deleted

## 2013-11-13 DIAGNOSIS — F988 Other specified behavioral and emotional disorders with onset usually occurring in childhood and adolescence: Secondary | ICD-10-CM

## 2013-11-13 MED ORDER — LISDEXAMFETAMINE DIMESYLATE 30 MG PO CAPS: 30.0000 mg | ORAL_CAPSULE | Freq: Every day | ORAL | Status: DC

## 2013-11-13 NOTE — Telephone Encounter (Signed)
Last seen-10/15/2013  Last filled-10/15/2013  UDS-07/10/2013 low risk, contract signed   Please advise. SW

## 2013-11-16 ENCOUNTER — Ambulatory Visit (HOSPITAL_BASED_OUTPATIENT_CLINIC_OR_DEPARTMENT_OTHER)
Admission: RE | Admit: 2013-11-16 | Discharge: 2013-11-16 | Disposition: A | Discharge status: Home / Self Care | Payer: Medicare PPO | Source: Ambulatory Visit | Attending: Family Medicine | Admitting: Family Medicine

## 2013-11-16 DIAGNOSIS — J189 Pneumonia, unspecified organism: Secondary | ICD-10-CM | POA: Insufficient documentation

## 2014-01-04 ENCOUNTER — Telehealth: Payer: Self-pay | Admitting: Family Medicine

## 2014-01-04 ENCOUNTER — Other Ambulatory Visit: Payer: Self-pay | Admitting: Family Medicine

## 2014-01-04 DIAGNOSIS — F988 Other specified behavioral and emotional disorders with onset usually occurring in childhood and adolescence: Secondary | ICD-10-CM

## 2014-01-04 MED ORDER — LISDEXAMFETAMINE DIMESYLATE 30 MG PO CAPS: 30.0000 mg | ORAL_CAPSULE | Freq: Every day | ORAL | Status: DC

## 2014-01-04 MED ORDER — CLONAZEPAM 0.5 MG PO TABS: ORAL_TABLET | ORAL | Status: DC

## 2014-01-04 NOTE — Telephone Encounter (Signed)
Last seen 10/15/13 and filled Klonopin 11/12/13 #60 with 1 and Vyvanse 11/12/13 #30. Please advise     KP

## 2014-01-04 NOTE — Telephone Encounter (Signed)
Patient left msg on triage line requesting rx for clonazepam 0.5mg  #60 and vyvanse 30 mg #30. CB# [phone]

## 2014-01-04 NOTE — Telephone Encounter (Signed)
Patient aware-- Feb, March, April printed      KP

## 2014-01-04 NOTE — Telephone Encounter (Signed)
Refill both 3 months

## 2014-01-25 ENCOUNTER — Telehealth: Payer: Self-pay | Admitting: *Deleted

## 2014-01-25 NOTE — Telephone Encounter (Signed)
Pt states [facility] stating that she needs a approval from the physician to continue taking Sumatriptan 50mg . Pt to drop off form at front desk and pick up rx for vyvanse today.

## 2014-02-23 ENCOUNTER — Other Ambulatory Visit: Payer: Self-pay | Admitting: Family Medicine

## 2014-03-04 ENCOUNTER — Telehealth: Payer: Self-pay | Admitting: Family Medicine

## 2014-03-04 ENCOUNTER — Ambulatory Visit (INDEPENDENT_AMBULATORY_CARE_PROVIDER_SITE_OTHER): Payer: Medicare PPO | Admitting: Cardiology

## 2014-03-04 ENCOUNTER — Ambulatory Visit (INDEPENDENT_AMBULATORY_CARE_PROVIDER_SITE_OTHER): Payer: Medicare PPO | Admitting: Internal Medicine

## 2014-03-04 ENCOUNTER — Encounter: Payer: Self-pay | Admitting: Cardiology

## 2014-03-04 ENCOUNTER — Telehealth: Payer: Self-pay | Admitting: *Deleted

## 2014-03-04 DIAGNOSIS — F988 Other specified behavioral and emotional disorders with onset usually occurring in childhood and adolescence: Secondary | ICD-10-CM

## 2014-03-04 DIAGNOSIS — R918 Other nonspecific abnormal finding of lung field: Secondary | ICD-10-CM

## 2014-03-04 DIAGNOSIS — I10 Essential (primary) hypertension: Secondary | ICD-10-CM

## 2014-03-04 DIAGNOSIS — R0789 Other chest pain: Secondary | ICD-10-CM

## 2014-03-04 DIAGNOSIS — J189 Pneumonia, unspecified organism: Secondary | ICD-10-CM

## 2014-03-04 DIAGNOSIS — J209 Acute bronchitis, unspecified: Secondary | ICD-10-CM

## 2014-03-04 LAB — PULMONARY FUNCTION TEST
DL/VA % pred: 98 %
DL/VA: 4.63 ml/min/mmHg/L
DLCO unc % pred: 92 %
DLCO unc: 21.12 ml/min/mmHg
FEF 25-75 Post: 3.31 L/sec
FEF 25-75 Pre: 1.88 L/sec
FEF2575-%Change-Post: 76 %
FEF2575-%Pred-Post: 164 %
FEF2575-%Pred-Pre: 93 %
FEV1-%Change-Post: 17 %
FEV1-%Pred-Post: 115 %
FEV1-%Pred-Pre: 98 %
FEV1-Post: 2.65 L
FEV1-Pre: 2.26 L
FEV1FVC-%Change-Post: 6 %
FEV1FVC-%Pred-Pre: 99 %
FEV6-%Change-Post: 11 %
FEV6-%Pred-Post: 112 %
FEV6-%Pred-Pre: 100 %
FEV6-Post: 3.24 L
FEV6-Pre: 2.9 L
FEV6FVC-%Change-Post: 0 %
FEV6FVC-%Pred-Post: 103 %
FEV6FVC-%Pred-Pre: 103 %
FVC-%Change-Post: 10 %
FVC-%Pred-Post: 108 %
FVC-%Pred-Pre: 97 %
FVC-Post: 3.25 L
FVC-Pre: 2.93 L
Post FEV1/FVC ratio: 82 %
Post FEV6/FVC ratio: 100 %
Pre FEV1/FVC ratio: 77 %
Pre FEV6/FVC Ratio: 99 %
RV % pred: 102 %
RV: 2.11 L
TLC % pred: 102 %
TLC: 5.01 L

## 2014-03-04 LAB — CBC WITH DIFFERENTIAL/PLATELET
Basophils Absolute: 0 10*3/uL (ref 0.0–0.1)
Basophils Relative: 0.4 % (ref 0.0–3.0)
Eosinophils Absolute: 0.3 10*3/uL (ref 0.0–0.7)
Eosinophils Relative: 2.4 % (ref 0.0–5.0)
HCT: 41.3 % (ref 36.0–46.0)
Hemoglobin: 13.7 g/dL (ref 12.0–15.0)
Lymphocytes Relative: 20.2 % (ref 12.0–46.0)
Lymphs Abs: 2.3 10*3/uL (ref 0.7–4.0)
MCHC: 33.3 g/dL (ref 30.0–36.0)
MCV: 92.6 fl (ref 78.0–100.0)
Monocytes Absolute: 0.8 10*3/uL (ref 0.1–1.0)
Monocytes Relative: 7.6 % (ref 3.0–12.0)
Neutro Abs: 7.8 10*3/uL — ABNORMAL HIGH (ref 1.4–7.7)
Neutrophils Relative %: 69.4 % (ref 43.0–77.0)
Platelets: 282 10*3/uL (ref 150.0–400.0)
RBC: 4.45 Mil/uL (ref 3.87–5.11)
RDW: 14 % (ref 11.5–14.6)
WBC: 11.2 10*3/uL — ABNORMAL HIGH (ref 4.5–10.5)

## 2014-03-04 LAB — SEDIMENTATION RATE: Sed Rate: 22 mm/hr (ref 0–22)

## 2014-03-04 MED ORDER — HYDROCHLOROTHIAZIDE 25 MG PO TABS: 25.0000 mg | ORAL_TABLET | Freq: Every day | ORAL | Status: DC

## 2014-03-04 MED ORDER — AMLODIPINE BESYLATE 5 MG PO TABS
5.0000 mg | ORAL_TABLET | Freq: Every day | ORAL | Status: DC
Start: 2014-03-04 — End: 2015-04-04

## 2014-03-04 NOTE — Telephone Encounter (Signed)
Pt was into see Dr Meda Coffee today. Pt was wanting Dr Meda Coffee to prescribe her an antibiotic for a chest cold. Dr Meda Coffee ordered a CBC and a ESR for today and prescribe abt based on labs.  Per Dr Meda Coffee labs were reviewed and Dr Meda Coffee instructed that pt did not need antibiotics at this time. Dr Meda Coffee advised pt that if chest cold symptoms continue on for a lengthy period, then she needed to follow up with her PCP on this.  LMTCB

## 2014-03-04 NOTE — Patient Instructions (Signed)
START TAKING HYDROCHLOROTHIAZIDE 25 MG DAILY  START TAKING AMLODIPINE 5 MG DAILY   DR NELSON HAS ORDERED FOR YOU TO HAVE A PULMONARY FUNCTION TEST DONE  Your physician recommends that you return for lab work in: TODAY (CBC & ESR)  Your physician recommends that you  follow-up PENDING TEST RESULTS

## 2014-03-04 NOTE — Progress Notes (Signed)
Patient ID: Molly HawthorneRita C Brau, female   DOB: 08/27/1946, 68 y.o.   MRN: [id number]    Patient Name: [name] Date of Encounter: 03/04/2014  Primary Care Provider:  Loreen FreudYvonne Lowne, DO Primary Cardiologist:  Tobias AlexanderKatarina Kapena Hamme, MD  CC: lower extremity swelling  Problem List   Past Medical History  Diagnosis Date  . Anxiety   . HYPERLIPIDEMIA 05/15/2007    Qualifier: Diagnosis of  By: Janit BernLowne DO, Yvonne ; pt denies this hx on 10/02/2013  . ADD (attention deficit disorder) 05/24/2012  . DIZZINESS 12/31/2009    Qualifier: Diagnosis of  By: Oswald HillockGraham-Oliver, Karen    . GERD 12/03/2008    Qualifier: Diagnosis of  By: Freddy JakschHooks, Phyllis    . Idiopathic scoliosis 04/19/2013  . Osteoporosis, unspecified 04/19/2013  . Pica 12/28/2007    Qualifier: Diagnosis of  By: Janit BernLowne DO, Yvonne    . PALPITATIONS, RECURRENT 12/12/2009    Qualifier: Diagnosis of  By: Janit BernLowne DO, Yvonne    . PSTPRC STATUS, BARIATRIC SURGERY 05/15/2007    Qualifier: Diagnosis of  By: Janit BernLowne DO, Yvonne    . TREMOR, ESSENTIAL 12/12/2009    Qualifier: Diagnosis of  By: Janit BernLowne DO, Yvonne    . Unspecified Myalgia and Myositis 05/15/2007    Centricity Description: FIBROMYALGIA Qualifier: Diagnosis of  By: Janit BernLowne DO, Yvonne   Centricity Description: MYALGIA Qualifier: Diagnosis of  By: Janit BernLowne DO, Yvonne    . Chronic back pain     "all over my back" (10/02/2013)  . Hypertension   . Crushing injury of arm, right 02/06/1989's    "it was crushed; wore cast from fingers to top of my shoulder" (11/25/2  . Crushing injury of left wrist 05/11/1989's  . Congestive heart disease     "I have the beginning signs" (10/02/2013)  . Pneumonia 10/02/2013    "had onsets of it before; not full blown til now" (10/02/2013)  . ANEMIA, MILD 06/26/2008    Qualifier: Diagnosis of  By: Janit BernLowne DO, Yvonne    . Other vitamin B12 deficiency anemia 10/30/2009    Qualifier: Diagnosis of  By: Floydene FlockHoops, Regina    . Migraines     "once/month" (10/02/2013)  . Arthritis     "joints, back"  (10/02/2013)  . Fibromyalgia   . Pulmonary nodules/lesions, multiple    Past Surgical History  Procedure Laterality Date  . Gastric bypass  ~ 2003  . Abdominal hysterectomy  1980's  . Knee arthroscopy Right 2002  . Tonsillectomy and adenoidectomy  1955  . Myomectomy  1980's  . Appendectomy  1980's    "w/either my hysterectomy or myomectomy" (10/02/2013)  . Tubal ligation  1980's  . Reduction mammaplasty Bilateral 2001  . Refractive surgery Bilateral 2004  . Closed reduction hand fracture Right 1990's    "it was crushed; wore cast from fingers to top of my shoulder" (10/02/2013)    Allergies  No Known Allergies  HPI  68 year old female who presents for an evaluation of lower extremity edema. She has a history of hypertension, obesity and hyperlipidemia. It all started in February when she developed symmetrical bilateral LE swelling with blister and significant pain. She has been seen by her PCP who prescribed her Furosemide with significant improvement at first but very limited response now. She has also a back pain that limits her ability to walk, but she states that about a year ago she was able to walk a mile without any difficulties.  She denies chest pain but admits to exertional shortness  of breath. She was started on Adderall (amphetamine) in January 2014 for fatigue.  LE edema resolved since she discontinued Adderal, she is complaining of SOB at rest and on exertion that she has had in March, now improved. She thinks that it might be related to her spine problem, she has chronic back pain. No h/o asthma, no h/o seasonal allergies. Pneumonia in November 2014. No cough.   Home Medications  Prior to Admission medications   Medication Sig Start Date End Date Taking? Authorizing Provider  alendronate (FOSAMAX) 70 MG tablet 1 po q week.  Take with a full glass of water on an empty stomach. 04/19/13  Yes Lelon Perla, DO  amphetamine-dextroamphetamine (ADDERALL XR) 30 MG 24 hr  capsule 2 po qd 06/15/13  Yes [name] Lowne, DO  ciprofloxacin (CIPRO) 500 MG tablet Take 1 tablet (500 mg total) by mouth 2 (two) times daily. 07/13/13  Yes [name] Lowne, DO  clonazePAM (KLONOPIN) 0.5 MG tablet TAKE 1 TABLET TWICE A DAY AS NEEDED FOR ANXIETY 05/16/13  Yes Grayling Congress Lowne, DO  cyclobenzaprine (FLEXERIL) 10 MG tablet Take 1 tablet (10 mg total) by mouth 2 (two) times daily as needed for muscle spasms. 09/25/12  Yes [name] Lowne, DO  furosemide (LASIX) 20 MG tablet Take 2 tablets (40 mg total) by mouth daily. 07/10/13  Yes [name] Lowne, DO  potassium chloride SA (K-DUR,KLOR-CON) 20 MEQ tablet Take 1 tablet (20 mEq total) by mouth daily. 07/10/13  Yes Lelon Perla, DO  SUMAtriptan (IMITREX) 50 MG tablet 1 po  At first sign of headache, may repeat in 2 h x1 03/23/13  Yes Lelon Perla, DO  venlafaxine XR (EFFEXOR-XR) 75 MG 24 hr capsule 3 Tablets daily 02/14/13  Yes Lelon Perla, DO    Family History  No family history on file.  Social History  History   Social History  . Marital Status: Married    Spouse Name: N/A    Number of Children: N/A  . Years of Education: N/A   Occupational History  . Not on file.   Social History Main Topics  . Smoking status: Never Smoker   . Smokeless tobacco: Never Used  . Alcohol Use: 0.0 oz/week     Comment: 10/02/2013 "glass of wine or martini maybe once/year"  . Drug Use: No  . Sexual Activity: No   Other Topics Concern  . Not on file   Social History Narrative  . No narrative on file    Review of Systems General:  No chills, fever, night sweats or weight changes.  Cardiovascular:  No chest pain, + dyspnea on exertion, + B/L lower extremity edema, orthopnea, palpitations, paroxysmal nocturnal dyspnea. Dermatological: No rash, lesions/masses Respiratory: No cough, dyspnea Urologic: No hematuria, dysuria Abdominal:   No nausea, vomiting, diarrhea, bright red blood per rectum, melena, or hematemesis Neurologic:  No visual  changes, wkns, changes in mental status. All other systems reviewed and are otherwise negative except as noted above.  Physical Exam  Filed Vitals:   03/04/14 0939  BP: 145/90  Pulse: 99   General: Pleasant, NAD Psych: Normal affect. Neuro: Alert and oriented X 3. Moves all extremities spontaneously. HEENT: Normal  Neck: Supple without bruits or JVD. Lungs:  Resp regular and unlabored, CTA. Heart: RRR no s3, s4, or murmurs. Abdomen: Soft, non-tender, non-distended, BS + x 4.  Extremities: No clubbing, cyanosis. DP/PT/Radials 2+ and equal bilaterally. B/L lower extremity edema  Accessory Clinical Findings  ECG -  SR, HR 86, normal intervals, no ST-T wave changes   Assessment & Plan  68 year old female with HTN, hyperlipidemia with dyspnea on exertion and lower extremity edema.   1. Lower extremity edema - Her echocardiogram shows preserved EF 65% but diastolic dysfunction. Resolved with discontinuation of Adderal. Adderall is known sympatomimetic and can be associated with fluid retention and cardiomyopathy.   2. SOB - we will order PFT to test for restrictive vs obstructive causes  3. Hypertension uncontrolled today, continue amlodipine, start HCTZ 25 mg po daily Follow up in 6 months.  Lars MassonKatarina H Harry Shuck, MD 03/04/2014, 10:01 AM

## 2014-03-04 NOTE — Progress Notes (Signed)
PFT done today. 

## 2014-03-04 NOTE — Telephone Encounter (Signed)
Caller name:[name] Relation to XN:ATFTDDUpt:patient Call back number:[phone] Pharmacy:  Reason for call: to request a refill Vyvanse

## 2014-03-05 ENCOUNTER — Telehealth: Payer: Self-pay | Admitting: Family Medicine

## 2014-03-05 ENCOUNTER — Emergency Department (HOSPITAL_BASED_OUTPATIENT_CLINIC_OR_DEPARTMENT_OTHER): Payer: Medicare PPO

## 2014-03-05 ENCOUNTER — Emergency Department (HOSPITAL_BASED_OUTPATIENT_CLINIC_OR_DEPARTMENT_OTHER)
Admission: EM | Admit: 2014-03-05 | Discharge: 2014-03-05 | Disposition: A | Discharge status: Home / Self Care | Payer: Medicare PPO | Attending: Emergency Medicine | Admitting: Emergency Medicine

## 2014-03-05 ENCOUNTER — Encounter (HOSPITAL_BASED_OUTPATIENT_CLINIC_OR_DEPARTMENT_OTHER): Payer: Self-pay | Admitting: Emergency Medicine

## 2014-03-05 DIAGNOSIS — Z8719 Personal history of other diseases of the digestive system: Secondary | ICD-10-CM | POA: Diagnosis not present

## 2014-03-05 DIAGNOSIS — R042 Hemoptysis: Secondary | ICD-10-CM | POA: Diagnosis present

## 2014-03-05 DIAGNOSIS — I509 Heart failure, unspecified: Secondary | ICD-10-CM | POA: Diagnosis not present

## 2014-03-05 DIAGNOSIS — Z87828 Personal history of other (healed) physical injury and trauma: Secondary | ICD-10-CM | POA: Diagnosis not present

## 2014-03-05 DIAGNOSIS — J189 Pneumonia, unspecified organism: Secondary | ICD-10-CM

## 2014-03-05 DIAGNOSIS — IMO0001 Reserved for inherently not codable concepts without codable children: Secondary | ICD-10-CM | POA: Diagnosis not present

## 2014-03-05 DIAGNOSIS — I10 Essential (primary) hypertension: Secondary | ICD-10-CM | POA: Diagnosis not present

## 2014-03-05 DIAGNOSIS — D649 Anemia, unspecified: Secondary | ICD-10-CM | POA: Diagnosis not present

## 2014-03-05 DIAGNOSIS — Z79899 Other long term (current) drug therapy: Secondary | ICD-10-CM | POA: Diagnosis not present

## 2014-03-05 DIAGNOSIS — F411 Generalized anxiety disorder: Secondary | ICD-10-CM | POA: Insufficient documentation

## 2014-03-05 DIAGNOSIS — Z862 Personal history of diseases of the blood and blood-forming organs and certain disorders involving the immune mechanism: Secondary | ICD-10-CM | POA: Insufficient documentation

## 2014-03-05 DIAGNOSIS — R000 Tachycardia, unspecified: Secondary | ICD-10-CM | POA: Diagnosis not present

## 2014-03-05 DIAGNOSIS — M479 Spondylosis, unspecified: Secondary | ICD-10-CM | POA: Insufficient documentation

## 2014-03-05 DIAGNOSIS — J159 Unspecified bacterial pneumonia: Secondary | ICD-10-CM | POA: Diagnosis not present

## 2014-03-05 DIAGNOSIS — G8929 Other chronic pain: Secondary | ICD-10-CM | POA: Insufficient documentation

## 2014-03-05 DIAGNOSIS — Z7982 Long term (current) use of aspirin: Secondary | ICD-10-CM | POA: Insufficient documentation

## 2014-03-05 DIAGNOSIS — F988 Other specified behavioral and emotional disorders with onset usually occurring in childhood and adolescence: Secondary | ICD-10-CM | POA: Insufficient documentation

## 2014-03-05 DIAGNOSIS — G43909 Migraine, unspecified, not intractable, without status migrainosus: Secondary | ICD-10-CM | POA: Diagnosis not present

## 2014-03-05 DIAGNOSIS — Z8639 Personal history of other endocrine, nutritional and metabolic disease: Secondary | ICD-10-CM | POA: Insufficient documentation

## 2014-03-05 DIAGNOSIS — M810 Age-related osteoporosis without current pathological fracture: Secondary | ICD-10-CM | POA: Diagnosis not present

## 2014-03-05 LAB — CBC WITH DIFFERENTIAL/PLATELET
BASOS PCT: 0 % (ref 0–1)
Basophils Absolute: 0 10*3/uL (ref 0.0–0.1)
EOS ABS: 0.3 10*3/uL (ref 0.0–0.7)
Eosinophils Relative: 2 % (ref 0–5)
HEMATOCRIT: 38.2 % (ref 36.0–46.0)
HEMOGLOBIN: 12.8 g/dL (ref 12.0–15.0)
Lymphocytes Relative: 18 % (ref 12–46)
Lymphs Abs: 2.1 10*3/uL (ref 0.7–4.0)
MCH: 31.1 pg (ref 26.0–34.0)
MCHC: 33.5 g/dL (ref 30.0–36.0)
MCV: 92.7 fL (ref 78.0–100.0)
MONO ABS: 0.9 10*3/uL (ref 0.1–1.0)
MONOS PCT: 8 % (ref 3–12)
Neutro Abs: 8.2 10*3/uL — ABNORMAL HIGH (ref 1.7–7.7)
Neutrophils Relative %: 72 % (ref 43–77)
Platelets: 263 10*3/uL (ref 150–400)
RBC: 4.12 MIL/uL (ref 3.87–5.11)
RDW: 13.5 % (ref 11.5–15.5)
WBC: 11.4 10*3/uL — ABNORMAL HIGH (ref 4.0–10.5)

## 2014-03-05 LAB — BASIC METABOLIC PANEL
BUN: 13 mg/dL (ref 6–23)
CALCIUM: 9.1 mg/dL (ref 8.4–10.5)
CO2: 26 meq/L (ref 19–32)
CREATININE: 0.8 mg/dL (ref 0.50–1.10)
Chloride: 100 mEq/L (ref 96–112)
GFR calc Af Amer: 87 mL/min — ABNORMAL LOW (ref >90)
GFR calc non Af Amer: 75 mL/min — ABNORMAL LOW (ref >90)
Glucose, Bld: 96 mg/dL (ref 70–99)
Potassium: 3.6 mEq/L — ABNORMAL LOW (ref 3.7–5.3)
Sodium: 139 mEq/L (ref 137–147)

## 2014-03-05 LAB — TROPONIN I: Troponin I: <0.3 ng/mL (ref <0.30)

## 2014-03-05 LAB — D-DIMER, QUANTITATIVE: D-Dimer, Quant: 0.52 ug/mL-FEU — ABNORMAL HIGH (ref 0.00–0.48)

## 2014-03-05 MED ORDER — LEVOFLOXACIN 750 MG PO TABS
750.0000 mg | ORAL_TABLET | Freq: Once | ORAL | Status: AC
  Administered 2014-03-05: 750 mg via ORAL
  Filled 2014-03-05: qty 1

## 2014-03-05 MED ORDER — IOHEXOL 350 MG/ML SOLN
100.0000 mL | Freq: Once | INTRAVENOUS | Status: AC | PRN
  Administered 2014-03-05: 100 mL via INTRAVENOUS

## 2014-03-05 MED ORDER — LEVOFLOXACIN 750 MG PO TABS: 750.0000 mg | ORAL_TABLET | Freq: Every day | ORAL | Status: DC

## 2014-03-05 MED ORDER — SODIUM CHLORIDE 0.9 % IV BOLUS (SEPSIS)
1000.0000 mL | Freq: Once | INTRAVENOUS | Status: AC
  Administered 2014-03-05: 1000 mL via INTRAVENOUS

## 2014-03-05 MED ORDER — KETOROLAC TROMETHAMINE 30 MG/ML IJ SOLN
30.0000 mg | Freq: Once | INTRAMUSCULAR | Status: AC
  Administered 2014-03-05: 30 mg via INTRAVENOUS
  Filled 2014-03-05: qty 1

## 2014-03-05 MED ORDER — GUAIFENESIN-CODEINE 100-10 MG/5ML PO SOLN: 5.0000 mL | Freq: Four times a day (QID) | ORAL | Status: DC | PRN

## 2014-03-05 NOTE — Telephone Encounter (Signed)
Spoke with patient and made her aware she was given the Rx in Feb 2015. She voiced understanding. She said she though she only got 2 mos.       KP

## 2014-03-05 NOTE — Telephone Encounter (Signed)
Returned pts call about Dr Delton SeeNelson reviewing her lab results from yesterday. The pt was requesting to be put on an antibiotic for a chest cold.  Per Dr Lindaann SloughNelson's review of previous lab work, the patient does not need any antibiotics at this time.  Explained to pt that Dr Delton SeeNelson recommends her drink plenty of fluids. Dr Delton SeeNelson also advised if chest cold symptoms continue on for a lengthy period of time, then pt needs to follow up with her PCP with this matter.  Pt verbalized understanding and pleased with the follow-up.

## 2014-03-05 NOTE — Telephone Encounter (Signed)
Relevant patient education assigned to patient using Emmi. ° °

## 2014-03-05 NOTE — ED Notes (Signed)
Have been in and out of Pt. Room through out the stay of Pt. And explained that the CT was not back yet and the Dr. Could not send her home with out CT being read.   Dr. Elesa MassedWard called radiology x 2 to inquire about CT scan  Charge RN called as well.   CT just came across  And Pt. Came to RN desk at time the CT reading crossed over.

## 2014-03-05 NOTE — ED Notes (Signed)
Coughing blood since yesterday. Pain under her breast and around to her back with breathing. Husband has pneumonia.

## 2014-03-05 NOTE — Discharge Instructions (Signed)
Pneumonia, Adult °Pneumonia is an infection of the lungs.  °CAUSES °Pneumonia may be caused by bacteria or a virus. Usually, these infections are caused by breathing infectious particles into the lungs (respiratory tract). °SYMPTOMS  °· Cough. °· Fever. °· Chest pain. °· Increased rate of breathing. °· Wheezing. °· Mucus production. °DIAGNOSIS  °If you have the common symptoms of pneumonia, your caregiver will typically confirm the diagnosis with a chest X-ray. The X-ray will show an abnormality in the lung (pulmonary infiltrate) if you have pneumonia. Other tests of your blood, urine, or sputum may be done to find the specific cause of your pneumonia. Your caregiver may also do tests (blood gases or pulse oximetry) to see how well your lungs are working. °TREATMENT  °Some forms of pneumonia may be spread to other people when you cough or sneeze. You may be asked to wear a mask before and during your exam. Pneumonia that is caused by bacteria is treated with antibiotic medicine. Pneumonia that is caused by the influenza virus may be treated with an antiviral medicine. Most other viral infections must run their course. These infections will not respond to antibiotics.  °PREVENTION °A pneumococcal shot (vaccine) is available to prevent a common bacterial cause of pneumonia. This is usually suggested for: °· People over 65 years old. °· Patients on chemotherapy. °· People with chronic lung problems, such as bronchitis or emphysema. °· People with immune system problems. °If you are over 65 or have a high risk condition, you may receive the pneumococcal vaccine if you have not received it before. In some countries, a routine influenza vaccine is also recommended. This vaccine can help prevent some cases of pneumonia. You may be offered the influenza vaccine as part of your care. °If you smoke, it is time to quit. You may receive instructions on how to stop smoking. Your caregiver can provide medicines and counseling to  help you quit. °HOME CARE INSTRUCTIONS  °· Cough suppressants may be used if you are losing too much rest. However, coughing protects you by clearing your lungs. You should avoid using cough suppressants if you can. °· Your caregiver may have prescribed medicine if he or she thinks your pneumonia is caused by a bacteria or influenza. Finish your medicine even if you start to feel better. °· Your caregiver may also prescribe an expectorant. This loosens the mucus to be coughed up. °· Only take over-the-counter or prescription medicines for pain, discomfort, or fever as directed by your caregiver. °· Do not smoke. Smoking is a common cause of bronchitis and can contribute to pneumonia. If you are a smoker and continue to smoke, your cough may last several weeks after your pneumonia has cleared. °· A cold steam vaporizer or humidifier in your room or home may help loosen mucus. °· Coughing is often worse at night. Sleeping in a semi-upright position in a recliner or using a couple pillows under your head will help with this. °· Get rest as you feel it is needed. Your body will usually let you know when you need to rest. °SEEK IMMEDIATE MEDICAL CARE IF:  °· Your illness becomes worse. This is especially true if you are elderly or weakened from any other disease. °· You cannot control your cough with suppressants and are losing sleep. °· You begin coughing up blood. °· You develop pain which is getting worse or is uncontrolled with medicines. °· You have a fever. °· Any of the symptoms which initially brought you in for treatment   are getting worse rather than better.  You develop shortness of breath or chest pain. MAKE SURE YOU:   Understand these instructions.  Will watch your condition.  Will get help right away if you are not doing well or get worse. Document Released: 10/25/2005 Document Revised: 01/17/2012 Document Reviewed: 01/14/2011 [facility] Patient Information 2014 [place], [place].   Narrative:  CLINICAL DATA: Cough chest pain and elevated D-dimer  EXAM: CT ANGIOGRAPHY CHEST WITH CONTRAST  TECHNIQUE: Multidetector CT imaging of the chest was performed using the standard protocol during bolus administration of intravenous contrast. Multiplanar CT image reconstructions and MIPs were obtained to evaluate the vascular anatomy.  CONTRAST: 100mL OMNIPAQUE IOHEXOL 350 MG/ML SOLN followed by a second injection of 100mL OMNIPAQUE IOHEXOL 350 MG/ML SOLN due to poor initial contrast bolus.  COMPARISON: DG CHEST 2 VIEW dated 03/05/2014; CT ANGIO CHEST W/CM &/OR WO/CM dated 10/02/2013; CT CHEST W/CM dated 07/11/2013  FINDINGS: The contrast within the pulmonary arterial tree is normal in appearance. There are no filling defects to suggest an acute pulmonary embolism. The caliber of the thoracic aorta is normal. There is no evidence of a false lumen. The cardiac chambers are normal in size. There is no mediastinal or hilar lymphadenopathy. There is no pleural nor pericardial effusion. The thoracic esophagus is normal in caliber. The retrosternal soft tissues appear normal. The thyroid lobes are normal in density and contour.  At lung window settings there are patchy areas of atelectasis anteriorly in the right middle lobe. This has markedly improved since the CT scan of October 02, 2013. There is pleural thickening posteriorly in the right lower lobe. Inferiorly in the right upper lobe there is minimal subsegmental atelectasis. In the left upper lobe laterally on image 38 of series 12 there is a stable 4 mm diameter ground-glass density nodule.  Within the upper abdomen the observed portions of the liver and spleen appear normal. There is mild wedge compression of T8 with high-grade wedge compression of T12. There is mild retropulsion of the posterior superior aspect of the T12 vertebral body. This mass to approximately 3 mm. The sternum and visualized ribs exhibit no acute  abnormalities.  Review of the MIP images confirms the above findings.  IMPRESSION: 1. There is no evidence of an acute pulmonary embolism nor of an acute thoracic aortic dissection. There is no thoracic aortic aneurysm. 2. There is no evidence of CHF. There is no pleural nor pericardial effusion. 3. There is subsegmental atelectasis in the right middle lobe and to a lesser extent inferiorly in the right upper lobe. These findings have improved since the previous study. A stable 4 mm diameter ground-glass density nodule is present the in the lateral aspect of the left upper lobe. If the patient is at high risk for bronchogenic carcinoma, follow-up chest CT at 6-12 months is recommended. If the patient is at low risk for bronchogenic carcinoma, follow-up chest CT at 12 months is recommended. This recommendation follows the consensus statement: Guidelines for Management of Small Pulmonary Nodules Detected on CT Scans: A Statement from the Fleischner Society as published in Radiology 2005;237:395-400. 4. These results were called by telephone at the time of interpretation on 03/05/2014 at 9:43 PM to Dr. Rochele RaringKRISTEN Skylen Danielsen , who verbally acknowledged these results.   Electronically Signed By: David SwazilandJordan On: 03/05/2014 21:44

## 2014-03-05 NOTE — ED Provider Notes (Signed)
TIME SEEN: 4:45 PM  CHIEF COMPLAINT: Chest pain, shortness of breath, cough  HPI: Patient is a 74 from female with a history of hypertension, hyperlipidemia, chronic back pain who presents emergency department with chills, cough with yellow sputum production that is blood streaked, and pain underneath bilateral breasts and she describes as a pain she describes as a tightness sharp pain that is worse with inspiration. She denies that she has had fevers. She states her husband was recently diagnosed with pneumonia and is being treated. She denies a history of PE or DVT. No recent prolonged immobilization such as long flight or hospitals in addition, surgery, fracture, trauma. No exogenous hormone use. No history of tobacco use. No lower extremity swelling or pain. No history of CAD or CHF. No nausea, vomiting, diaphoresis or dizziness.  ROS: See HPI Constitutional: no fever  Eyes: no drainage  ENT: no runny nose   Cardiovascular:   chest pain  Resp:  SOB  GI: no vomiting GU: no dysuria Integumentary: no rash  Allergy: no hives  Musculoskeletal: no leg swelling  Neurological: no slurred speech ROS otherwise negative  PAST MEDICAL HISTORY/PAST SURGICAL HISTORY:  Past Medical History  Diagnosis Date  . Anxiety   . HYPERLIPIDEMIA 05/15/2007    Qualifier: Diagnosis of  By: Janit Bern ; pt denies this hx on 10/02/2013  . ADD (attention deficit disorder) 05/24/2012  . DIZZINESS 12/31/2009    Qualifier: Diagnosis of  By: Oswald Hillock    . GERD 12/03/2008    Qualifier: Diagnosis of  By: Freddy Jaksch    . Idiopathic scoliosis 04/19/2013  . Osteoporosis, unspecified 04/19/2013  . Pica 12/28/2007    Qualifier: Diagnosis of  By: Janit Bern    . PALPITATIONS, RECURRENT 12/12/2009    Qualifier: Diagnosis of  By: Janit Bern    . PSTPRC STATUS, BARIATRIC SURGERY 05/15/2007    Qualifier: Diagnosis of  By: Janit Bern    . TREMOR, ESSENTIAL 12/12/2009    Qualifier: Diagnosis of   By: Janit Bern    . Unspecified Myalgia and Myositis 05/15/2007    Centricity Description: FIBROMYALGIA Qualifier: Diagnosis of  By: Janit Bern   Centricity Description: MYALGIA Qualifier: Diagnosis of  By: Janit Bern    . Chronic back pain     "all over my back" (10/02/2013)  . Hypertension   . Crushing injury of arm, right 02/06/1989's    "it was crushed; wore cast from fingers to top of my shoulder" (11/25/2  . Crushing injury of left wrist 05/11/1989's  . Congestive heart disease     "I have the beginning signs" (10/02/2013)  . Pneumonia 10/02/2013    "had onsets of it before; not full blown til now" (10/02/2013)  . ANEMIA, MILD 06/26/2008    Qualifier: Diagnosis of  By: Janit Bern    . Other vitamin B12 deficiency anemia 10/30/2009    Qualifier: Diagnosis of  By: Floydene Flock    . Migraines     "once/month" (10/02/2013)  . Arthritis     "joints, back" (10/02/2013)  . Fibromyalgia   . Pulmonary nodules/lesions, multiple     MEDICATIONS:  Prior to Admission medications   Medication Sig Start Date End Date Taking? Authorizing Provider  amLODipine (NORVASC) 5 MG tablet Take 1 tablet (5 mg total) by mouth daily. 03/04/14   Lars Masson, MD  aspirin 81 MG tablet Take 81 mg by mouth daily.    Historical Provider, MD  Cholecalciferol (VITAMIN D3) 2000 UNITS TABS Take 2,000 mg by mouth daily.    Historical Provider, MD  clonazePAM (KLONOPIN) 0.5 MG tablet TAKE ONE (1) TABLET BY MOUTH TWO (2) TIMES DAILY AS NEEDED FOR ANXIETY 01/04/14   Lelon PerlaYvonne R Lowne, DO  cyanocobalamin 2000 MCG tablet Take 2,000 mcg by mouth daily.    Historical Provider, MD  cyclobenzaprine (FLEXERIL) 10 MG tablet Take 1 tablet (10 mg total) by mouth 2 (two) times daily as needed for muscle spasms. 09/25/12   Lelon PerlaYvonne R Lowne, DO  ferrous sulfate (IRON SUPPLEMENT) 325 (65 FE) MG tablet Take 325 mg by mouth daily as needed.     Historical Provider, MD  hydrochlorothiazide (HYDRODIURIL) 25 MG tablet  Take 1 tablet (25 mg total) by mouth daily. 03/04/14   Lars MassonKatarina H Nelson, MD  lisdexamfetamine (VYVANSE) 30 MG capsule Take 1 capsule (30 mg total) by mouth daily. 01/04/14   Lelon PerlaYvonne R Lowne, DO  oxyCODONE (ROXICODONE) 15 MG immediate release tablet Take 15 mg by mouth every 4 (four) hours as needed for pain.    Historical Provider, MD  SUMAtriptan (IMITREX) 50 MG tablet TAKE 1 TABLET AT ONSET OF MIGRAINE, MAY REPEAT IN 2 HOURS IF NEEDED.    Lelon PerlaYvonne R Lowne, DO  venlafaxine XR (EFFEXOR-XR) 75 MG 24 hr capsule TAKE 3 CAPSULES DAILY 08/22/13   Lelon PerlaYvonne R Lowne, DO    ALLERGIES:  No Known Allergies  SOCIAL HISTORY:  History  Substance Use Topics  . Smoking status: Never Smoker   . Smokeless tobacco: Never Used  . Alcohol Use: 0.0 oz/week     Comment: 10/02/2013 "glass of wine or martini maybe once/year"    FAMILY HISTORY: No family history on file.  EXAM: BP 169/92  Pulse 102  Temp(Src) 98.2 F (36.8 C) (Oral)  Resp 16  Ht 5\' 4"  (1.626 m)  Wt 196 lb (88.905 kg)  BMI 33.63 kg/m2  SpO2 98% CONSTITUTIONAL: Alert and oriented and responds appropriately to questions. Well-appearing; well-nourished, nontoxic, in no apparent distress, pleasant HEAD: Normocephalic EYES: Conjunctivae clear, PERRL ENT: normal nose; no rhinorrhea; moist mucous membranes; pharynx without lesions noted NECK: Supple, no meningismus, no LAD  CARD: RRR; S1 and S2 appreciated; no murmurs, no clicks, no rubs, no gallops RESP: Normal chest excursion without splinting or tachypnea; breath sounds clear and equal bilaterally; no wheezes, no rhonchi, no rales,  ABD/GI: Normal bowel sounds; non-distended; soft, non-tender, no rebound, no guarding BACK:  The back appears normal and is non-tender to palpation, there is no CVA tenderness EXT: Normal ROM in all joints; non-tender to palpation; no edema; normal capillary refill; no cyanosis    SKIN: Normal color for age and race; warm NEURO: Moves all extremities  equally PSYCH: The patient's mood and manner are appropriate. Grooming and personal hygiene are appropriate.  MEDICAL DECISION MAKING: Patient here with chest pain, shortness of breath, cough with mild hemoptysis. She is mildly tachycardic but not hypoxic or tachypnea. No respiratory distress. Likely pneumonia versus bronchitis. However given patient is complaining of chest pain and shortness of breath that is pleuritic and mild hemoptysis, also concerned for malignancy and pulmonary embolus. We'll obtain labs with troponin, d-dimer, chest x-ray. We'll give Toradol for her pain and reassess.  ED PROGRESS: She has a mild leukocytosis with left shift. Her chest x-ray shows a possible right lower lobe infiltrate versus atelectasis. She does have a slightly elevated d-dimer. Will obtain a CT of her chest to rule out pulmonary embolus. She is able  to ambulate without difficulty. We'll give Levaquin for CAP.   10:04 PM  Pt's CT scan read was delayed.  Patient's CT scan shows no pulmonary embolus or aneurysm. No dissection. There is groundglass opacities in the left upper and right lower lobe. We'll discharge him with codeine with guaifenesin and Levaquin for the acquired pneumonia. Have discussed return precautions and supportive care instructions. Patient verbalizes understanding and is comfortable with this plan.    EKG Interpretation  Date/Time:  Tuesday March 05 2014 16:22:46 EDT Ventricular Rate:  99 PR Interval:  146 QRS Duration: 84 QT Interval:  352 QTC Calculation: 451 R Axis:   -5 Text Interpretation:  Normal sinus rhythm Normal ECG Confirmed by Sheron Robin,  DO, Nohelia Valenza ([id number]) on 03/05/2014 4:35:16 PM        Layla MawKristen N Rhett Mutschler, DO 03/05/14 [id number]

## 2014-03-05 NOTE — ED Notes (Signed)
Call placed to CT tech regarding delay in results, will check and give a call back. Primary RN made aware.

## 2014-03-07 ENCOUNTER — Telehealth: Payer: Self-pay

## 2014-03-07 ENCOUNTER — Ambulatory Visit: Payer: Medicare PPO | Admitting: Family Medicine

## 2014-03-07 NOTE — Telephone Encounter (Signed)
Appointment scheduled on 03/05/14 for sore throat and chest pain. Appointment scheduled for today at 1115.  Called to triage patient.  No answer.  Left message for call back.

## 2014-04-01 ENCOUNTER — Other Ambulatory Visit: Payer: Self-pay | Admitting: Family Medicine

## 2014-04-02 NOTE — Telephone Encounter (Signed)
Last seen 10/15/13 and filled 01/04/14 #60 with 2 refills. Please advise    KP

## 2014-04-11 ENCOUNTER — Telehealth: Payer: Self-pay | Admitting: Family Medicine

## 2014-04-11 DIAGNOSIS — F988 Other specified behavioral and emotional disorders with onset usually occurring in childhood and adolescence: Secondary | ICD-10-CM

## 2014-04-11 MED ORDER — LISDEXAMFETAMINE DIMESYLATE 30 MG PO CAPS: 30.0000 mg | ORAL_CAPSULE | Freq: Every day | ORAL | Status: DC

## 2014-04-11 NOTE — Telephone Encounter (Signed)
Caller name:Devetta Trovato Relation to pt: patient Call back number: [phone] Pharmacy:  Reason for call: to request a refill for Vyvanse

## 2014-04-11 NOTE — Telephone Encounter (Signed)
patient aware Rx ready for pick up.    KP 

## 2014-04-22 ENCOUNTER — Ambulatory Visit (INDEPENDENT_AMBULATORY_CARE_PROVIDER_SITE_OTHER): Payer: Medicare PPO | Admitting: Family Medicine

## 2014-04-22 ENCOUNTER — Encounter: Payer: Self-pay | Admitting: Family Medicine

## 2014-04-22 ENCOUNTER — Telehealth: Payer: Self-pay | Admitting: *Deleted

## 2014-04-22 VITALS — BP 132/80 | HR 90 | Temp 98.3°F | Wt 204.0 lb

## 2014-04-22 DIAGNOSIS — R413 Other amnesia: Secondary | ICD-10-CM

## 2014-04-22 DIAGNOSIS — L819 Disorder of pigmentation, unspecified: Secondary | ICD-10-CM

## 2014-04-22 DIAGNOSIS — R631 Polydipsia: Secondary | ICD-10-CM

## 2014-04-22 DIAGNOSIS — F411 Generalized anxiety disorder: Secondary | ICD-10-CM

## 2014-04-22 MED ORDER — TRETINOIN 0.025 % EX CREA: TOPICAL_CREAM | Freq: Every day | CUTANEOUS | Status: DC

## 2014-04-22 MED ORDER — CLONAZEPAM 0.5 MG PO TABS: 0.5000 mg | ORAL_TABLET | Freq: Three times a day (TID) | ORAL | Status: DC | PRN

## 2014-04-22 NOTE — Telephone Encounter (Signed)
Prior authorization denied for tretinoin cream. The only covered alternative is adapalene 0.1% cream. Please advise. JG//CMA

## 2014-04-22 NOTE — Telephone Encounter (Signed)
Ok to switch it --same directions

## 2014-04-22 NOTE — Telephone Encounter (Signed)
Prior authorization initiated for Trentinoin. Awaiting determination. JG//CMA

## 2014-04-22 NOTE — Progress Notes (Signed)
   Subjective:    Patient ID: [name], female    DOB: 06-27-1946, 68 y.o.   MRN: [id number]  HPI Pt here with mult complaints.  He wants to be tested for diabetes.  Pt is eating a lot of sugar.  She states she can't control herself. She also states her family is c/o memory loss.  She does not think it is a huge problem but her husband feels it is.  Her anxiety is allso worse seconday to inc stress at home with grand kids.     Review of Systems    as above Objective:   Physical Exam BP 132/80  Pulse 90  Temp(Src) 98.3 F (36.8 C) (Oral)  Wt 204 lb (92.534 kg)  SpO2 94% General appearance: alert, cooperative, appears stated age and no distress Nose: Nares normal. Septum midline. Mucosa normal. No drainage or sinus tenderness. Throat: lips, mucosa, and tongue normal; teeth and gums normal Neck: no adenopathy, no carotid bruit, no JVD, supple, symmetrical, trachea midline and thyroid not enlarged, symmetric, no tenderness/mass/nodules Lungs: clear to auscultation bilaterally Heart: S1, S2 normal Neurologic: Alert and oriented X 3, normal strength and tone. Normal symmetric reflexes. Normal coordination and gait Mental status: Alert, oriented, thought content appropriate, Folstein mini-mental status exam results: 30/30        Assessment & Plan:  1. Memory loss mmse 30/30 Pt wants to hold off on MRI Check labs--- pt does not need neuro at this point but if her family wants it we will refer2 - Basic metabolic panel - CBC with Differential - Hepatic function panel - POCT urinalysis dipstick - Vitamin B12 - TSH - Hemoglobin A1c  2. Polydipsia Check labs - Basic metabolic panel - CBC with Differential - Hepatic function panel - POCT urinalysis dipstick - Vitamin B12 - TSH - Hemoglobin A1c  3. Generalized anxiety disorder Inc stress at hoe - clonazePAM (KLONOPIN) 0.5 MG tablet; Take 1 tablet (0.5 mg total) by mouth 3 (three) times daily as needed for anxiety.   Dispense: 90 tablet; Refill: 2  4. Hyperpigmentation Pt requested retin a  - tretinoin (RETIN-A) 0.025 % cream; Apply topically at bedtime.  Dispense: 45 g; Refill: 0

## 2014-04-22 NOTE — Patient Instructions (Signed)

## 2014-04-22 NOTE — Progress Notes (Signed)
Pre visit review using our clinic review tool, if applicable. No additional management support is needed unless otherwise documented below in the visit note. 

## 2014-04-23 LAB — CBC WITH DIFFERENTIAL/PLATELET
BASOS ABS: 0.1 10*3/uL (ref 0.0–0.1)
Basophils Relative: 1.1 % (ref 0.0–3.0)
EOS ABS: 0.7 10*3/uL (ref 0.0–0.7)
Eosinophils Relative: 11.7 % — ABNORMAL HIGH (ref 0.0–5.0)
HCT: 38.8 % (ref 36.0–46.0)
Hemoglobin: 12.8 g/dL (ref 12.0–15.0)
LYMPHS ABS: 2.8 10*3/uL (ref 0.7–4.0)
Lymphocytes Relative: 45.2 % (ref 12.0–46.0)
MCHC: 33 g/dL (ref 30.0–36.0)
MCV: 93.2 fl (ref 78.0–100.0)
MONOS PCT: 8 % (ref 3.0–12.0)
Monocytes Absolute: 0.5 10*3/uL (ref 0.1–1.0)
Neutro Abs: 2.1 10*3/uL (ref 1.4–7.7)
Neutrophils Relative %: 34 % — ABNORMAL LOW (ref 43.0–77.0)
PLATELETS: 240 10*3/uL (ref 150.0–400.0)
RBC: 4.17 Mil/uL (ref 3.87–5.11)
RDW: 13.5 % (ref 11.5–15.5)
WBC: 6.1 10*3/uL (ref 4.0–10.5)

## 2014-04-23 LAB — BASIC METABOLIC PANEL
BUN: 9 mg/dL (ref 6–23)
CALCIUM: 9.1 mg/dL (ref 8.4–10.5)
CO2: 29 meq/L (ref 19–32)
Chloride: 104 mEq/L (ref 96–112)
Creatinine, Ser: 0.8 mg/dL (ref 0.4–1.2)
GFR: 73.05 mL/min (ref >60.00)
GLUCOSE: 80 mg/dL (ref 70–99)
Potassium: 4 mEq/L (ref 3.5–5.1)
Sodium: 139 mEq/L (ref 135–145)

## 2014-04-23 LAB — HEPATIC FUNCTION PANEL
ALBUMIN: 4 g/dL (ref 3.5–5.2)
ALT: 11 U/L (ref 0–35)
AST: 25 U/L (ref 0–37)
Alkaline Phosphatase: 80 U/L (ref 39–117)
BILIRUBIN TOTAL: 0.6 mg/dL (ref 0.2–1.2)
Bilirubin, Direct: 0.1 mg/dL (ref 0.0–0.3)
Total Protein: 6.9 g/dL (ref 6.0–8.3)

## 2014-04-23 LAB — TSH: TSH: 1.26 u[IU]/mL (ref 0.35–4.50)

## 2014-04-23 LAB — HEMOGLOBIN A1C: HEMOGLOBIN A1C: 5.3 % (ref 4.6–6.5)

## 2014-04-23 LAB — VITAMIN B12: VITAMIN B 12: 638 pg/mL (ref 211–911)

## 2014-04-23 MED ORDER — ADAPALENE 0.1 % EX CREA: TOPICAL_CREAM | Freq: Every day | CUTANEOUS | Status: DC

## 2014-04-23 NOTE — Telephone Encounter (Signed)
Med e-scribed to [facility]. JG//CMA

## 2014-04-23 NOTE — Addendum Note (Signed)
Addended by: Amado CoeGLOVER, JESSICA A on: 04/23/2014 09:18 AM   Modules accepted: Orders, Medications

## 2014-04-25 ENCOUNTER — Telehealth: Payer: Self-pay | Admitting: *Deleted

## 2014-04-25 MED ORDER — CYCLOBENZAPRINE HCL 10 MG PO TABS: 10.0000 mg | ORAL_TABLET | Freq: Two times a day (BID) | ORAL | Status: DC | PRN

## 2014-04-25 NOTE — Telephone Encounter (Signed)
Refill x1 

## 2014-04-25 NOTE — Telephone Encounter (Signed)
Patient called and would like a refill on cyclobenzaprine (FLEXERIL) 10 MG tablet Last refill: 09/25/2012 #60, 0 refills. Ok to refill?

## 2014-05-08 ENCOUNTER — Encounter: Payer: Self-pay | Admitting: Family Medicine

## 2014-05-09 ENCOUNTER — Telehealth: Payer: Self-pay

## 2014-05-09 MED ORDER — TIZANIDINE HCL 4 MG PO CAPS: 4.0000 mg | ORAL_CAPSULE | Freq: Three times a day (TID) | ORAL | Status: DC | PRN

## 2014-05-09 NOTE — Telephone Encounter (Signed)
Patient has been made aware and the Rx has been faxed.     KP

## 2014-05-09 NOTE — Telephone Encounter (Signed)
tizandidine 4mg  #30  1 po tid prn

## 2014-05-09 NOTE — Telephone Encounter (Signed)
PA intiated and has been denied for Cyclobenzaprine 10 mg tablets.  This medication is no longer on the patient's formulary due to increased risk of sedation, in order for the member to have this drug she would need to be younger than the age 66 or the prescriber has documented the indication for the continued use of the high risk medication, and how the benefit outweighs the risk along with an ongoing monitor plan for the medication. Formulary alternatives are Baclofen and Tizanidine. Please advise.   KP

## 2014-05-24 ENCOUNTER — Institutional Professional Consult (permissible substitution): Payer: Medicare PPO | Admitting: Family Medicine

## 2014-05-31 ENCOUNTER — Institutional Professional Consult (permissible substitution): Payer: Medicare PPO | Admitting: Family Medicine

## 2014-06-05 ENCOUNTER — Other Ambulatory Visit: Payer: Self-pay | Admitting: Family Medicine

## 2014-06-06 NOTE — Telephone Encounter (Signed)
Rx sent to the pharmacy by e-script.  Pt aware.//AB/CMA 

## 2014-06-06 NOTE — Telephone Encounter (Signed)
Pt called to check the status of the below medication. She states doesn't have anymore

## 2014-06-13 ENCOUNTER — Encounter: Payer: Self-pay | Admitting: Family Medicine

## 2014-06-13 ENCOUNTER — Ambulatory Visit (INDEPENDENT_AMBULATORY_CARE_PROVIDER_SITE_OTHER): Payer: Medicare PPO | Admitting: Family Medicine

## 2014-06-13 VITALS — BP 116/78 | HR 98 | Temp 97.6°F | Wt 204.0 lb

## 2014-06-13 DIAGNOSIS — M810 Age-related osteoporosis without current pathological fracture: Secondary | ICD-10-CM

## 2014-06-13 MED ORDER — TERIPARATIDE (RECOMBINANT) 600 MCG/2.4ML ~~LOC~~ SOLN: 20.0000 ug | Freq: Every day | SUBCUTANEOUS | Status: DC

## 2014-06-13 NOTE — Progress Notes (Signed)
   Subjective:    Patient ID: Burnard HawthorneRita C Cureton, female    DOB: [birth year], 68 y.o.   MRN: [id number]  HPI Pt here to f/u from NS-- Dr Noel Geroldohen is requesting she take forteo to build up her bones.    Review of Systems As above     Objective:   Physical Exam BP 116/78  Pulse 98  Temp(Src) 97.6 F (36.4 C) (Oral)  Wt 204 lb (92.534 kg)  SpO2 93% General appearance: alert, cooperative, appears stated age and no distress Neurologic: Alert and oriented X 3, normal strength and tone. Normal symmetric reflexes. Normal coordination and gait  .     Assessment & Plan:  1. Osteoporosis, unspecified NS requesting pt take forteo to build up bones before he can do surgery - Teriparatide, Recombinant, (FORTEO) 600 MCG/2.4ML SOLN; Inject 0.08 mLs (20 mcg total) into the skin daily.  Dispense: 2.4 mL; Refill: 11

## 2014-06-13 NOTE — Progress Notes (Signed)
Pre visit review using our clinic review tool, if applicable. No additional management support is needed unless otherwise documented below in the visit note. 

## 2014-06-13 NOTE — Patient Instructions (Signed)
Teriparatide injection What is this medicine? TERIPARATIDE (terr ih PAR a tyd) increases bone mass and strength. It helps make healthy bone and to slow bone loss. This medicine is used to prevent bone fractures. This medicine may be used for other purposes; ask your health care provider or pharmacist if you have questions. COMMON BRAND NAME(S): Forteo What should I tell my health care provider before I take this medicine? They need to know if you have any of these conditions: -bone disease other than osteoporosis -heart, kidney or liver disease -history of cancer in the bone -kidney stone -Paget's disease -parathyroid disease -receiving radiation therapy -an unusual or allergic reaction to teriparatide, other medicines, foods, dyes, or preservatives -pregnant or trying to get pregnant -breast-feeding How should I use this medicine? This medicine comes in an injection pen device. This pen injects the medicine under your skin. Follow the directions on the prescription label. You will be taught how to use this medicine. Take your medicine at regular intervals. Do not take your medicine more often than directed. Do not use this medicine if it has solid particles in it, or if it is cloudy or colored. It should be clear and colorless. Be sure that you are using your pen device correctly. A special MedGuide will be given to you by the pharmacist with each prescription and refill. Be sure to read this information carefully each time. Talk to your pediatrician regarding the use of this medicine in children. Special care may be needed. Overdosage: If you think you have taken too much of this medicine contact a poison control center or emergency room at once. NOTE: This medicine is only for you. Do not share this medicine with others. What if I miss a dose? If you miss a dose, take it as soon as you can. If it is almost time for your next dose, take only that dose. Do not take double or extra  doses. What may interact with this medicine? -digoxin -other medicines to strengthen bone This list may not describe all possible interactions. Give your health care provider a list of all the medicines, herbs, non-prescription drugs, or dietary supplements you use. Also tell them if you smoke, drink alcohol, or use illegal drugs. Some items may interact with your medicine. What should I watch for while using this medicine? Visit your doctor or health care professional for regular checks on your progress. Your doctor may order blood tests and other tests to see how you are doing. You should make sure you get enough calcium and vitamin D while you are taking this medicine, unless your doctor tells you not to. Discuss the foods you eat and the vitamins you take with your health care professional. Dennis Bast may get drowsy or dizzy. Do not drive, use machinery, or do anything that needs mental alertness until you know how this medicine affects you. Do not stand or sit up quickly, especially if you are an older patient. This reduces the risk of dizzy or fainting spells. What side effects may I notice from receiving this medicine? Side effects that you should report to your doctor or health care professional as soon as possible: -allergic reactions like skin rash, itching or hives, swelling of the face, lips, or tongue -chronic constipation -high blood pressure -irregular heartbeat, chest pain -nausea, vomiting -unusually weak or tired Side effects that usually do not require medical attention (report to your doctor or health care professional if they continue or are bothersome): -bone pain -cough, runny nose -  headache -leg cramps -muscle spasms in the back or legs -pain, redness, irritation or swelling at the injection site -stomach upset -trouble sleeping This list may not describe all possible side effects. Call your doctor for medical advice about side effects. You may report side effects to FDA at  1-800-FDA-1088. Where should I keep my medicine? Keep out of the reach of children. Store in a refrigerator between 2 and 8 degrees C (36 and 46 degrees F). Do not freeze. Recap the pen injector when not in use to protect it from light and damage. Use quickly after taking out of the refrigerator and return to refrigerator right after using. Throw away any unused medicine 28 days after the first injection from the device. Do not use after the expiration date printed on the pen and pen packaging. NOTE: This sheet is a summary. It may not cover all possible information. If you have questions about this medicine, talk to your doctor, pharmacist, or health care provider.  2015, Elsevier/Gold Standard. (2008-10-22 17:45:37)

## 2014-06-25 ENCOUNTER — Telehealth: Payer: Self-pay | Admitting: *Deleted

## 2014-06-25 NOTE — Telephone Encounter (Signed)
Prior auth. Started for forteo 20mcg . Reference number [id number] to check on status ..will fax to notify our office wether approved or denied.

## 2014-06-28 ENCOUNTER — Telehealth: Payer: Self-pay | Admitting: Family Medicine

## 2014-06-28 DIAGNOSIS — F988 Other specified behavioral and emotional disorders with onset usually occurring in childhood and adolescence: Secondary | ICD-10-CM

## 2014-06-28 MED ORDER — LISDEXAMFETAMINE DIMESYLATE 30 MG PO CAPS: 30.0000 mg | ORAL_CAPSULE | Freq: Every day | ORAL | Status: DC

## 2014-06-28 NOTE — Telephone Encounter (Signed)
Patient aware Rx will be ready to pick up after lunch.    KP

## 2014-06-28 NOTE — Telephone Encounter (Signed)
Caller name: Ruth SinkRita Relation to pt: Call back number:[phone]   Reason for call:  Pt is needing a hard copy of the Rx lisdexamfetamine (VYVANSE) 30 MG capsule .  Call when complete

## 2014-07-01 NOTE — Telephone Encounter (Signed)
Received Approval letter for Forteo from [place].  Authorization is good until 06/25/2016.  Called pharmacy with approval information.  LMOM @ (11:40am @ [phone]) informing the pt that the Forteo was approved and pharmacy aware. Asked the pt to give [place] a call if she has any questions.//AB/CMA

## 2014-07-22 ENCOUNTER — Other Ambulatory Visit: Payer: Self-pay | Admitting: Family Medicine

## 2014-07-22 NOTE — Telephone Encounter (Signed)
Last seen 03/26/14 and filled 04/22/14 #60 with 2 refills.    Please advise    KP

## 2014-08-08 ENCOUNTER — Other Ambulatory Visit: Payer: Self-pay | Admitting: Family Medicine

## 2014-08-12 ENCOUNTER — Other Ambulatory Visit: Payer: Self-pay | Admitting: Family Medicine

## 2014-09-19 ENCOUNTER — Other Ambulatory Visit: Payer: Self-pay | Admitting: Family Medicine

## 2014-09-19 NOTE — Telephone Encounter (Signed)
Last seen 06/13/14 and filled 07/22/14 #90 with 1 refill. Please advise     KP

## 2014-10-23 ENCOUNTER — Telehealth: Payer: Self-pay | Admitting: Family Medicine

## 2014-10-23 DIAGNOSIS — F988 Other specified behavioral and emotional disorders with onset usually occurring in childhood and adolescence: Secondary | ICD-10-CM

## 2014-10-23 MED ORDER — LISDEXAMFETAMINE DIMESYLATE 30 MG PO CAPS: 30.0000 mg | ORAL_CAPSULE | Freq: Every day | ORAL | Status: DC

## 2014-10-23 NOTE — Telephone Encounter (Signed)
Pt requesting a refill lisdexamfetamine (VYVANSE) 30 MG capsule.

## 2014-10-23 NOTE — Telephone Encounter (Signed)
Patient aware that Rx will be ready for pick up tomorrow.       KP

## 2014-11-08 HISTORY — PX: SPINAL CORD STIMULATOR IMPLANT: SHX2422

## 2014-11-15 ENCOUNTER — Telehealth: Payer: Self-pay | Admitting: Family Medicine

## 2014-11-15 MED ORDER — CLONAZEPAM 0.5 MG PO TABS: ORAL_TABLET | ORAL | Status: DC

## 2014-11-15 NOTE — Telephone Encounter (Signed)
Med's faxed.    KP 

## 2014-11-15 NOTE — Telephone Encounter (Signed)
Caller name: Burnard Hawthornearker, Chelsey C Relation to pt: self  Call back number: [phone] Pharmacy:  [facility] - [place], [place] - [address] [phone] (Phone)     Reason for call:  Pt requesting a refill clonazePAM (KLONOPIN) 0.5 MG tablet

## 2014-11-15 NOTE — Telephone Encounter (Signed)
Refill x1   2 refills 

## 2014-11-15 NOTE — Telephone Encounter (Signed)
Last seen 06/13/14 and filled 09/19/14 #90 with 1 refill.  UDS 04/22/14 low risk   Please advise     KP

## 2014-11-27 ENCOUNTER — Encounter: Payer: Self-pay | Admitting: Gastroenterology

## 2014-11-28 ENCOUNTER — Ambulatory Visit (INDEPENDENT_AMBULATORY_CARE_PROVIDER_SITE_OTHER): Payer: Medicare PPO

## 2014-11-28 DIAGNOSIS — Z23 Encounter for immunization: Secondary | ICD-10-CM

## 2014-11-28 NOTE — Progress Notes (Signed)
Pre visit review using our clinic review tool, if applicable. No additional management support is needed unless otherwise documented below in the visit note. Patient tolerated well.  

## 2014-12-05 ENCOUNTER — Ambulatory Visit (INDEPENDENT_AMBULATORY_CARE_PROVIDER_SITE_OTHER): Payer: Medicare PPO | Admitting: Family Medicine

## 2014-12-05 ENCOUNTER — Encounter: Payer: Self-pay | Admitting: Family Medicine

## 2014-12-05 VITALS — BP 138/86 | HR 86 | Temp 98.6°F | Wt 214.0 lb

## 2014-12-05 DIAGNOSIS — Z23 Encounter for immunization: Secondary | ICD-10-CM

## 2014-12-05 DIAGNOSIS — E785 Hyperlipidemia, unspecified: Secondary | ICD-10-CM

## 2014-12-05 DIAGNOSIS — R413 Other amnesia: Secondary | ICD-10-CM

## 2014-12-05 DIAGNOSIS — I10 Essential (primary) hypertension: Secondary | ICD-10-CM

## 2014-12-05 NOTE — Addendum Note (Signed)
Addended by: Noreene LarssonLARSON, Adley Castello A on: 12/05/2014 01:55 PM   Modules accepted: Orders

## 2014-12-05 NOTE — Progress Notes (Signed)
Pre visit review using our clinic review tool, if applicable. No additional management support is needed unless otherwise documented below in the visit note. 

## 2014-12-05 NOTE — Progress Notes (Signed)
Subjective:    Patient ID: [name], female    DOB: [birth year], 69 y.o.   MRN: [id number]  HPI  Patient here for f/u depression, and bp. She is also here with her husband and there is con't concern about memory loss.  She is in a lot of pain from compression fracture-- she is under the care of Dr Noel Gerold  Past Medical History  Diagnosis Date  . Anxiety   . HYPERLIPIDEMIA 05/15/2007    Qualifier: Diagnosis of  By: Janit Bern ; pt denies this hx on 10/02/2013  . ADD (attention deficit disorder) 05/24/2012  . DIZZINESS 12/31/2009    Qualifier: Diagnosis of  By: Oswald Hillock    . GERD 12/03/2008    Qualifier: Diagnosis of  By: Freddy Jaksch    . Idiopathic scoliosis 04/19/2013  . Osteoporosis, unspecified 04/19/2013  . Pica 12/28/2007    Qualifier: Diagnosis of  By: Janit Bern    . PALPITATIONS, RECURRENT 12/12/2009    Qualifier: Diagnosis of  By: Janit Bern    . PSTPRC STATUS, BARIATRIC SURGERY 05/15/2007    Qualifier: Diagnosis of  By: Janit Bern    . TREMOR, ESSENTIAL 12/12/2009    Qualifier: Diagnosis of  By: Janit Bern    . Unspecified Myalgia and Myositis 05/15/2007    Centricity Description: FIBROMYALGIA Qualifier: Diagnosis of  By: Janit Bern   Centricity Description: MYALGIA Qualifier: Diagnosis of  By: Janit Bern    . Chronic back pain     "all over my back" (10/02/2013)  . Hypertension   . Crushing injury of arm, right 02/06/1989's    "it was crushed; wore cast from fingers to top of my shoulder" (11/25/2  . Crushing injury of left wrist 05/11/1989's  . Congestive heart disease     "I have the beginning signs" (10/02/2013)  . Pneumonia 10/02/2013    "had onsets of it before; not full blown til now" (10/02/2013)  . ANEMIA, MILD 06/26/2008    Qualifier: Diagnosis of  By: Janit Bern    . Other vitamin B12 deficiency anemia 10/30/2009    Qualifier: Diagnosis of  By: Floydene Flock    . Migraines     "once/month" (10/02/2013)  .  Arthritis     "joints, back" (10/02/2013)  . Fibromyalgia   . Pulmonary nodules/lesions, multiple     Review of Systems  Constitutional: Positive for fatigue. Negative for activity change, appetite change and unexpected weight change.  Respiratory: Negative for cough and shortness of breath.   Cardiovascular: Negative for chest pain and palpitations.  Musculoskeletal: Positive for back pain, arthralgias and gait problem. Negative for myalgias and joint swelling.  Neurological: Negative for light-headedness, numbness and headaches.  Psychiatric/Behavioral: Positive for dysphoric mood and decreased concentration. Negative for suicidal ideas, behavioral problems, confusion, sleep disturbance, self-injury and agitation. The patient is nervous/anxious.        Objective:    Physical Exam  Constitutional: She is oriented to person, place, and time. She appears well-developed and well-nourished. No distress.  HENT:  Right Ear: External ear normal.  Left Ear: External ear normal.  Nose: Nose normal.  Mouth/Throat: Oropharynx is clear and moist.  Eyes: EOM are normal. Pupils are equal, round, and reactive to light.  Neck: Normal range of motion. Neck supple.  Cardiovascular: Normal rate, regular rhythm and normal heart sounds.   No murmur heard. Pulmonary/Chest: Effort normal and breath sounds normal. No respiratory distress. She has no  wheezes. She has no rales. She exhibits no tenderness.  Neurological: She is alert and oriented to person, place, and time.  Chronic back pain--across low back -- no radiation to legs  Psychiatric: She has a normal mood and affect. Her behavior is normal. Judgment and thought content normal.    BP 138/86 mmHg  Pulse 86  Temp(Src) 98.6 F (37 C) (Oral)  Wt 214 lb (97.07 kg)  SpO2 97% Wt Readings from Last 3 Encounters:  12/05/14 214 lb (97.07 kg)  06/13/14 204 lb (92.534 kg)  04/22/14 204 lb (92.534 kg)     Lab Results  Component Value Date    WBC 6.1 04/22/2014   HGB 12.8 04/22/2014   HCT 38.8 04/22/2014   PLT 240.0 04/22/2014   GLUCOSE 80 04/22/2014   CHOL 182 10/28/2009   TRIG 155.0* 10/28/2009   HDL 61.30 10/28/2009   LDLDIRECT 129.1 07/17/2007   LDLCALC 90 10/28/2009   ALT 11 04/22/2014   AST 25 04/22/2014   NA 139 04/22/2014   K 4.0 04/22/2014   CL 104 04/22/2014   CREATININE 0.8 04/22/2014   BUN 9 04/22/2014   CO2 29 04/22/2014   TSH 1.26 04/22/2014   INR 1.08 10/02/2013   HGBA1C 5.3 04/22/2014    Dg Chest 2 View  03/05/2014   CLINICAL DATA:  Cough.  EXAM: CHEST  2 VIEW  COMPARISON:  DG CHEST 2 VIEW dated 11/16/2013  FINDINGS: Mediastinum and hilar structures normal. Mild right middle lobe subsegmental atelectasis and/or infiltrate present. Component of scarring may be present. Cardiac structures are unremarkable. No pleural effusion or pneumothorax. Degenerative changes thoracic spine with stable mild mid thoracic spine compression fracture.  IMPRESSION: 1. Mild right mid lobe subsegmental atelectasis and or infiltrate. 2. Thoracic spine DJD with stable mild mid thoracic spine compression fracture.   Electronically Signed   By: Maisie Fus  Register   On: 03/05/2014 16:59   Ct Angio Chest Pe W/cm &/or Wo Cm  03/05/2014   CLINICAL DATA:  Cough chest pain and elevated D-dimer  EXAM: CT ANGIOGRAPHY CHEST WITH CONTRAST  TECHNIQUE: Multidetector CT imaging of the chest was performed using the standard protocol during bolus administration of intravenous contrast. Multiplanar CT image reconstructions and MIPs were obtained to evaluate the vascular anatomy.  CONTRAST:  OMNIPAQUE IOHEXOL 350 MG/ML SOLN followed by a second injection of OMNIPAQUE IOHEXOL 350 MG/ML SOLN due to poor initial contrast bolus.  COMPARISON:  DG CHEST 2 VIEW dated 03/05/2014; CT ANGIO CHEST W/CM &/OR WO/CM dated 10/02/2013; CT CHEST W/CM dated 07/11/2013  FINDINGS: The contrast within the pulmonary arterial tree is normal in appearance. There are no  filling defects to suggest an acute pulmonary embolism. The caliber of the thoracic aorta is normal. There is no evidence of a false lumen. The cardiac chambers are normal in size. There is no mediastinal or hilar lymphadenopathy. There is no pleural nor pericardial effusion. The thoracic esophagus is normal in caliber. The retrosternal soft tissues appear normal. The thyroid lobes are normal in density and contour.  At lung window settings there are patchy areas of atelectasis anteriorly in the right middle lobe. This has markedly improved since the CT scan of October 02, 2013. There is pleural thickening posteriorly in the right lower lobe. Inferiorly in the right upper lobe there is minimal subsegmental atelectasis. In the left upper lobe laterally on image 38 of series 12 there is a stable 4 mm diameter ground-glass density nodule.  Within the upper abdomen  the observed portions of the liver and spleen appear normal. There is mild wedge compression of T8 with high-grade wedge compression of T12. There is mild retropulsion of the posterior superior aspect of the T12 vertebral body. This mass to approximately 3 mm. The sternum and visualized ribs exhibit no acute abnormalities.  Review of the MIP images confirms the above findings.  IMPRESSION: 1. There is no evidence of an acute pulmonary embolism nor of an acute thoracic aortic dissection. There is no thoracic aortic aneurysm. 2. There is no evidence of CHF. There is no pleural nor pericardial effusion. 3. There is subsegmental atelectasis in the right middle lobe and to a lesser extent inferiorly in the right upper lobe. These findings have improved since the previous study. A stable 4 mm diameter ground-glass density nodule is present the in the lateral aspect of the left upper lobe. If the patient is at high risk for bronchogenic carcinoma, follow-up chest CT at 6-12 months is recommended. If the patient is at low risk for bronchogenic carcinoma, follow-up  chest CT at 12 months is recommended. This recommendation follows the consensus statement: Guidelines for Management of Small Pulmonary Nodules Detected on CT Scans: A Statement from the Fleischner Society as published in Radiology 2005;237:395-400. 4. These results were called by telephone at the time of interpretation on 03/05/2014 at 9:43 PM to Dr. Rochele RaringKRISTEN WARD , who verbally acknowledged these results.   Electronically Signed   By: David  [place]   On: 03/05/2014 21:44       Assessment & Plan:   Problem List Items Addressed This Visit    Essential hypertension   Relevant Orders   Basic metabolic panel   CBC with Differential/Platelet   Hepatic function panel   Lipid panel   POCT urinalysis dipstick   Microalbumin / creatinine urine ratio   TSH   Vitamin B12   MEMORY LOSS - Primary   Relevant Orders   Ambulatory referral to Neurology   Basic metabolic panel   CBC with Differential/Platelet   Hepatic function panel   Lipid panel   POCT urinalysis dipstick   Microalbumin / creatinine urine ratio   TSH   Vitamin B12    Other Visit Diagnoses    Hyperlipidemia        Relevant Orders    Hepatic function panel    Lipid panel        Loreen FreudYvonne Lowne, DO

## 2014-12-05 NOTE — Patient Instructions (Signed)

## 2014-12-06 ENCOUNTER — Other Ambulatory Visit: Payer: Medicare PPO

## 2015-01-03 ENCOUNTER — Ambulatory Visit: Payer: Medicare PPO | Admitting: Family Medicine

## 2015-01-06 ENCOUNTER — Other Ambulatory Visit: Payer: Self-pay | Admitting: Family Medicine

## 2015-01-07 ENCOUNTER — Ambulatory Visit: Payer: Medicare PPO | Admitting: Family Medicine

## 2015-01-08 ENCOUNTER — Other Ambulatory Visit: Payer: Medicare PPO

## 2015-01-10 ENCOUNTER — Ambulatory Visit: Payer: Medicare PPO | Admitting: Family Medicine

## 2015-01-15 ENCOUNTER — Encounter: Payer: Self-pay | Admitting: Neurology

## 2015-01-15 ENCOUNTER — Telehealth: Payer: Self-pay | Admitting: Family Medicine

## 2015-01-15 ENCOUNTER — Encounter: Payer: Self-pay | Admitting: Family Medicine

## 2015-01-15 ENCOUNTER — Ambulatory Visit (INDEPENDENT_AMBULATORY_CARE_PROVIDER_SITE_OTHER): Payer: Medicare PPO | Admitting: Neurology

## 2015-01-15 VITALS — BP 140/90 | HR 100 | Resp 16 | Wt 220.0 lb

## 2015-01-15 DIAGNOSIS — G8929 Other chronic pain: Secondary | ICD-10-CM

## 2015-01-15 DIAGNOSIS — R413 Other amnesia: Secondary | ICD-10-CM

## 2015-01-15 DIAGNOSIS — M549 Dorsalgia, unspecified: Secondary | ICD-10-CM

## 2015-01-15 DIAGNOSIS — I10 Essential (primary) hypertension: Secondary | ICD-10-CM

## 2015-01-15 NOTE — Progress Notes (Signed)
NEUROLOGY CONSULTATION NOTE  [name] MRN: [id number] DOB: 02-15-1946  Referring provider: Dr. Loreen Freud Primary care provider: Dr. Loreen Freud  Reason for consult:  Memory loss  Dear Dr Laury Axon:  Thank you for your kind referral of [name] for consultation of the above symptoms. Although her history is well known to you, please allow me to reiterate it for the purpose of our medical record. Records and images were personally reviewed where available.  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 She reports that "sometimes I forget things." She states that the reason she is here is because "my husband and granddaughter think I may have early signs of dementia." She feel symptoms started around 2 years ago. Her family would tell her something and she would not recall it. She constantly misplaces things. She has occasional difficulty multitasking. She denies getting lost driving, no missed medications. Her husband has always been in charge of bill payments. She feels "I'm doing too much." She reports there is a lot going on with her grandchildren and great-grandchildren, and that she has "a bit more on my plate than should be." She goes around with her calendar to help them out.    She has been taking Vyvanse prn for the past year to "keep everything in schedule." This definitely helps her, she takes it a couple weeks out of the month. She has been taking Effexor for at least 15 years and wants to wean off this, reports mood is good. She has been taking Opana and oxycodone for back pain for several years. She takes prn clonazepam for anxiety. She has a history of migraines that are well-controlled, taking Imitrex prn every 1-2 months with good effect. She denies any dizziness, diplopia, dysarthria, dysphagia, neck  pain, focal numbness/tingling/weakness, bowel/bladder dysfunction. No anosmia. She has occasional tremors when flustered. She has chronic back and knee pain and received cortisone injections in her knees every 6 months. Her left knee bothers her today. She reports sleep is good. No history of significant head injuries. She has occasional alcohol. No family history of memory loss.   Laboratory Data: Lab Results  Component Value Date   WBC 6.1 04/22/2014   HGB 12.8 04/22/2014   HCT 38.8 04/22/2014   MCV 93.2 04/22/2014   PLT 240.0 04/22/2014     Chemistry      Component Value Date/Time   NA 139 04/22/2014 1153   K 4.0 04/22/2014 1153   CL 104 04/22/2014 1153   CO2 29 04/22/2014 1153   BUN 9 04/22/2014 1153   CREATININE 0.8 04/22/2014 1153      Component Value Date/Time   CALCIUM 9.1 04/22/2014 1153   ALKPHOS 80 04/22/2014 1153   AST 25 04/22/2014 1153   ALT 11 04/22/2014 1153   BILITOT 0.6 04/22/2014 1153     Lab Results  Component Value Date   TSH 1.26 04/22/2014   Lab Results  Component Value Date   VITAMINB12 638 04/22/2014    PAST MEDICAL HISTORY: Past Medical History  Diagnosis Date  . Anxiety   . HYPERLIPIDEMIA 05/15/2007    Qualifier: Diagnosis of  By: Janit Bern ; pt denies this hx on 10/02/2013  . ADD (attention deficit disorder) 05/24/2012  . DIZZINESS 12/31/2009    Qualifier: Diagnosis of  By: Oswald Hillock    .  GERD 12/03/2008    Qualifier: Diagnosis of  By: Freddy Jaksch    . Idiopathic scoliosis 04/19/2013  . Osteoporosis, unspecified 04/19/2013  . Pica 12/28/2007    Qualifier: Diagnosis of  By: Janit Bern    . PALPITATIONS, RECURRENT 12/12/2009    Qualifier: Diagnosis of  By: Janit Bern    . PSTPRC STATUS, BARIATRIC SURGERY 05/15/2007    Qualifier: Diagnosis of  By: Janit Bern    . TREMOR, ESSENTIAL 12/12/2009    Qualifier: Diagnosis of  By: Janit Bern    . Unspecified Myalgia and Myositis 05/15/2007    Centricity  Description: FIBROMYALGIA Qualifier: Diagnosis of  By: Janit Bern   Centricity Description: MYALGIA Qualifier: Diagnosis of  By: Janit Bern    . Chronic back pain     "all over my back" (10/02/2013)  . Hypertension   . Crushing injury of arm, right 02/06/1989's    "it was crushed; wore cast from fingers to top of my shoulder" (11/25/2  . Crushing injury of left wrist 05/11/1989's  . Congestive heart disease     "I have the beginning signs" (10/02/2013)  . Pneumonia 10/02/2013    "had onsets of it before; not full blown til now" (10/02/2013)  . ANEMIA, MILD 06/26/2008    Qualifier: Diagnosis of  By: Janit Bern    . Other vitamin B12 deficiency anemia 10/30/2009    Qualifier: Diagnosis of  By: Floydene Flock    . Migraines     "once/month" (10/02/2013)  . Arthritis     "joints, back" (10/02/2013)  . Fibromyalgia   . Pulmonary nodules/lesions, multiple     PAST SURGICAL HISTORY: Past Surgical History  Procedure Laterality Date  . Gastric bypass  ~ 2003  . Abdominal hysterectomy  1980's  . Knee arthroscopy Right 2002  . Tonsillectomy and adenoidectomy  1955  . Myomectomy  1980's  . Appendectomy  1980's    "w/either my hysterectomy or myomectomy" (10/02/2013)  . Tubal ligation  1980's  . Reduction mammaplasty Bilateral 2001  . Refractive surgery Bilateral 2004  . Closed reduction hand fracture Right 1990's    "it was crushed; wore cast from fingers to top of my shoulder" (10/02/2013)    MEDICATIONS: Current Outpatient Prescriptions on File Prior to Visit  Medication Sig Dispense Refill  . amLODipine (NORVASC) 5 MG tablet Take 1 tablet (5 mg total) by mouth daily. 180 tablet 3  . aspirin 81 MG tablet Take 81 mg by mouth daily.    . Cholecalciferol (VITAMIN D3) 2000 UNITS TABS Take 2,000 mg by mouth daily.    . clonazePAM (KLONOPIN) 0.5 MG tablet TAKE ONE (1) TABLET THREE (3) TIMES EACHDAY AS NEEDED 90 tablet 2  . cyanocobalamin 2000 MCG tablet Take 2,000 mcg by  mouth daily.    . cyclobenzaprine (FLEXERIL) 10 MG tablet Take 10 mg by mouth 3 (three) times daily as needed for muscle spasms.    . ferrous sulfate (IRON SUPPLEMENT) 325 (65 FE) MG tablet Take 325 mg by mouth daily as needed.     . hydrochlorothiazide (HYDRODIURIL) 25 MG tablet Take 1 tablet (25 mg total) by mouth daily. 90 tablet 3  . lisdexamfetamine (VYVANSE) 30 MG capsule Take 1 capsule (30 mg total) by mouth daily. (Patient taking differently: Take 30 mg by mouth as needed. ) 30 capsule 0  . oxyCODONE (ROXICODONE) 15 MG immediate release tablet Take 15 mg by mouth every 4 (four) hours as needed for  pain.    [name] SUMAtriptan (IMITREX) 50 MG tablet TAKE 1 TABLET AT ONSET OF HEADACHE. MAY REPEAT IN 2 HOURS IF NEEDED 10 tablet 2  . Teriparatide, Recombinant, (FORTEO) 600 MCG/2.4ML SOLN Inject 0.08 mLs (20 mcg total) into the skin daily. 2.4 mL 11  . venlafaxine XR (EFFEXOR-XR) 75 MG 24 hr capsule TAKE 3 CAPSULES BY MOUTH EVERY DAY 90 capsule 5  . adapalene (DIFFERIN) 0.1 % cream Apply topically at bedtime. (Patient not taking: Reported on 01/15/2015) 45 g 0   No current facility-administered medications on file prior to visit.    ALLERGIES: No Known Allergies  FAMILY HISTORY: No family history on file.  SOCIAL HISTORY: History   Social History  . Marital Status: Married    Spouse Name: N/A  . Number of Children: 2  . Years of Education: N/A   Occupational History  . Retired    Social History Main Topics  . Smoking status: Never Smoker   . Smokeless tobacco: Never Used  . Alcohol Use: 0.0 oz/week    0 Standard drinks or equivalent per week     Comment: 10/02/2013 "glass of wine or martini maybe once/year"  . Drug Use: No  . Sexual Activity: No   Other Topics Concern  . Not on file   Social History Narrative    REVIEW OF SYSTEMS: Constitutional: No fevers, chills, or sweats, no generalized fatigue, change in appetite Eyes: No visual changes, double vision, eye pain Ear,  nose and throat: No hearing loss, ear pain, nasal congestion, sore throat Cardiovascular: No chest pain, palpitations Respiratory:  No shortness of breath at rest or with exertion, wheezes GastrointestinaI: No nausea, vomiting, diarrhea, abdominal pain, fecal incontinence Genitourinary:  No dysuria, urinary retention or frequency Musculoskeletal:  No neck pain, +back pain Integumentary: No rash, pruritus, skin lesions Neurological: as above Psychiatric: + depression, anxiety; no insomnia  Endocrine: No palpitations, fatigue, diaphoresis, mood swings, change in appetite, change in weight, increased thirst Hematologic/Lymphatic:  No anemia, purpura, petechiae. Allergic/Immunologic: no itchy/runny eyes, nasal congestion, recent allergic reactions, rashes  PHYSICAL EXAM: Filed Vitals:   01/15/15 0846  BP: 140/90  Pulse: 100  Resp: 16   General: No acute distress Head:  Normocephalic/atraumatic Eyes: Fundoscopic exam shows bilateral sharp discs, no vessel changes, exudates, or hemorrhages Neck: supple, no paraspinal tenderness, full range of motion Back: No paraspinal tenderness Heart: regular rate and rhythm Lungs: Clear to auscultation bilaterally. Vascular: No carotid bruits. Skin/Extremities: No rash, no edema Neurological Exam: Mental status: alert and oriented to person, place, and time, no dysarthria or aphasia, Fund of knowledge is appropriate.  Recent and remote memory are intact.  Attention and concentration are normal.    Able to name objects and repeat phrases. Montreal Cognitive Assessment  01/15/2015  Visuospatial/ Executive (0/5) 5  Naming (0/3) 3  Attention: Read list of digits (0/2) 2  Attention: Read list of letters (0/1) 1  Attention: Serial 7 subtraction starting at 100 (0/3) 3  Language: Repeat phrase (0/2) 2  Language : Fluency (0/1) 1  Abstraction (0/2) 2  Delayed Recall (0/5) 5  Orientation (0/6) 5  Total 29  Adjusted Score (based on education) 29    Cranial nerves: CN I: not tested CN II: pupils equal, round and reactive to light, visual fields intact, fundi unremarkable. CN III, IV, VI:  full range of motion, no nystagmus, no ptosis CN V: facial sensation intact CN VII: upper and lower face symmetric CN VIII: hearing intact to finger rub  CN IX, X: gag intact, uvula midline CN XI: sternocleidomastoid and trapezius muscles intact CN XII: tongue midline Bulk & Tone: normal, no fasciculations. Motor: 5/5 throughout with no pronator drift. Sensation: intact to light touch, cold, pin, vibration and joint position sense.  No extinction to double simultaneous stimulation.  Romberg test negative Deep Tendon Reflexes: +1 both UE, +2 bilateral patella, +1 bilateral ankle jerks, no ankle clonus Plantar responses: downgoing bilaterally Cerebellar: no incoordination on finger to nose, heel to shin. No dysdiadochokinesia Gait: slow and cautious due to left knee pain, no ataxia, ambulates with cane Tremor: none  IMPRESSION: This is a 69 year old right-handed woman with a history of hypertension, depression, anxiety, chronic back and knee pain, presenting for evaluation of worsening memory over the past 2 years. MOCA score today is normal 29/30, neurological exam is non-focal. We discussed different causes of memory loss, she has not yet done labwork ordered by Dr. Laury AxonLowne in January, which includes TSH and B12. MRI brain without contrast will be ordered to assess for underlying structural abnormality and assess vascular load. We discussed pseudodementia and effects of mood on memory, she does endorse stress from family obligations and feel this will lessen when her granddaughter moves to her own house. She has been taking pain medications for many years, this can contribute to cognitive issues as well. We discussed the importance of control of vascular risk factors, physical exercise, and brain stimulation exercises for brain health. She will follow-up  in 6 months.   Thank you for allowing me to participate in the care of this patient. Please do not hesitate to call for any questions or concerns.   Patrcia DollyKaren Shiza Thelen, M.D.  CC: Dr. Laury AxonLowne

## 2015-01-15 NOTE — Patient Instructions (Signed)
1. Schedule MRI brain without contrast 2. Proceed with bloodwork planned by Dr. Laury AxonLowne (thyroid, B12) 3. Physical exercise and brain stimulation exercises are important for brain health 4. Follow-up in 6 months

## 2015-01-15 NOTE — Telephone Encounter (Signed)
Pt was no show for past 3 appointments- including 3/4. Letter sent. Would you like for us to attempt to reschedule- charge no show?

## 2015-01-20 NOTE — Telephone Encounter (Signed)
charge 

## 2015-01-22 ENCOUNTER — Other Ambulatory Visit: Payer: Medicare PPO

## 2015-01-23 ENCOUNTER — Ambulatory Visit (INDEPENDENT_AMBULATORY_CARE_PROVIDER_SITE_OTHER): Payer: Medicare PPO | Admitting: Cardiology

## 2015-01-23 ENCOUNTER — Encounter: Payer: Self-pay | Admitting: Cardiology

## 2015-01-23 VITALS — BP 132/82 | HR 87 | Ht 63.0 in | Wt 217.1 lb

## 2015-01-23 DIAGNOSIS — R0602 Shortness of breath: Secondary | ICD-10-CM

## 2015-01-23 DIAGNOSIS — I82403 Acute embolism and thrombosis of unspecified deep veins of lower extremity, bilateral: Secondary | ICD-10-CM

## 2015-01-23 DIAGNOSIS — I10 Essential (primary) hypertension: Secondary | ICD-10-CM

## 2015-01-23 DIAGNOSIS — R600 Localized edema: Secondary | ICD-10-CM

## 2015-01-23 MED ORDER — FUROSEMIDE 40 MG PO TABS: 40.0000 mg | ORAL_TABLET | Freq: Every day | ORAL | Status: DC

## 2015-01-23 NOTE — Progress Notes (Signed)
Patient ID: [name], female   DOB: 11-Apr-1946, 69 y.o.   MRN: [id number] Patient ID: [name], female   DOB: 08/10/1946, 69 y.o.   MRN: [id number]    Patient Name: [name] Date of Encounter: 01/23/2015  Primary Care Provider:  Loreen Freud, DO Primary Cardiologist:  Tobias Alexander, MD  CC: lower extremity swelling  Problem List   Past Medical History  Diagnosis Date  . Anxiety   . HYPERLIPIDEMIA 05/15/2007    Qualifier: Diagnosis of  By: Janit Bern ; pt denies this hx on 10/02/2013  . ADD (attention deficit disorder) 05/24/2012  . DIZZINESS 12/31/2009    Qualifier: Diagnosis of  By: Oswald Hillock    . GERD 12/03/2008    Qualifier: Diagnosis of  By: Freddy Jaksch    . Idiopathic scoliosis 04/19/2013  . Osteoporosis, unspecified 04/19/2013  . Pica 12/28/2007    Qualifier: Diagnosis of  By: Janit Bern    . PALPITATIONS, RECURRENT 12/12/2009    Qualifier: Diagnosis of  By: Janit Bern    . PSTPRC STATUS, BARIATRIC SURGERY 05/15/2007    Qualifier: Diagnosis of  By: Janit Bern    . TREMOR, ESSENTIAL 12/12/2009    Qualifier: Diagnosis of  By: Janit Bern    . Unspecified Myalgia and Myositis 05/15/2007    Centricity Description: FIBROMYALGIA Qualifier: Diagnosis of  By: Janit Bern   Centricity Description: MYALGIA Qualifier: Diagnosis of  By: Janit Bern    . Chronic back pain     "all over my back" (10/02/2013)  . Hypertension   . Crushing injury of arm, right 02/06/1989's    "it was crushed; wore cast from fingers to top of my shoulder" (11/25/2  . Crushing injury of left wrist 05/11/1989's  . Congestive heart disease     "I have the beginning signs" (10/02/2013)  . Pneumonia 10/02/2013    "had onsets of it before; not full blown til now" (10/02/2013)  . ANEMIA, MILD 06/26/2008    Qualifier: Diagnosis of  By: Janit Bern    . Other vitamin B12 deficiency anemia 10/30/2009    Qualifier: Diagnosis of  By: Floydene Flock    .  Migraines     "once/month" (10/02/2013)  . Arthritis     "joints, back" (10/02/2013)  . Fibromyalgia   . Pulmonary nodules/lesions, multiple    Past Surgical History  Procedure Laterality Date  . Gastric bypass  ~ 2003  . Abdominal hysterectomy  1980's  . Knee arthroscopy Right 2002  . Tonsillectomy and adenoidectomy  1955  . Myomectomy  1980's  . Appendectomy  1980's    "w/either my hysterectomy or myomectomy" (10/02/2013)  . Tubal ligation  1980's  . Reduction mammaplasty Bilateral 2001  . Refractive surgery Bilateral 2004  . Closed reduction hand fracture Right 1990's    "it was crushed; wore cast from fingers to top of my shoulder" (10/02/2013)    Allergies  No Known Allergies  HPI  69 year old female who presents for an evaluation of lower extremity edema. She has a history of hypertension, obesity and hyperlipidemia. It all started in February when she developed symmetrical bilateral LE swelling with blister and significant pain. She has been seen by her PCP who prescribed her Furosemide with significant improvement at first but very limited response now. She has also a back pain that limits her ability to walk, but she states that about a year ago she was able to  walk a mile without any difficulties.  She denies chest pain but admits to exertional shortness of breath. She was started on Adderall (amphetamine) in January 2014 for fatigue.  LE edema resolved since she discontinued Adderal, she is complaining of SOB at rest and on exertion that she has had in March, now improved. She thinks that it might be related to her spine problem, she has chronic back pain. No h/o asthma, no h/o seasonal allergies. Pneumonia in November 2014. No cough.  01/23/2015 - the patient is coming after almost a year, she states that she has been doing well until 3 months ago when she's developed lower extremity edema again predominantly in her left leg is significantly larger than her right. She is  also experiencing pain in both of her legs, improved with knee shots at the orthopedics. She denies any chest pain, SOB, orthopnea, PND, no palpitations or syncope.   Home Medications  Prior to Admission medications   Medication Sig Start Date End Date Taking? Authorizing Provider  alendronate (FOSAMAX) 70 MG tablet 1 po q week.  Take with a full glass of water on an empty stomach. 04/19/13  Yes Lelon Perla, DO  amphetamine-dextroamphetamine (ADDERALL XR) 30 MG 24 hr capsule 2 po qd 06/15/13  Yes [name] Lowne, DO  ciprofloxacin (CIPRO) 500 MG tablet Take 1 tablet (500 mg total) by mouth 2 (two) times daily. 07/13/13  Yes [name] Lowne, DO  clonazePAM (KLONOPIN) 0.5 MG tablet TAKE 1 TABLET TWICE A DAY AS NEEDED FOR ANXIETY 05/16/13  Yes Grayling Congress Lowne, DO  cyclobenzaprine (FLEXERIL) 10 MG tablet Take 1 tablet (10 mg total) by mouth 2 (two) times daily as needed for muscle spasms. 09/25/12  Yes [name] Lowne, DO  furosemide (LASIX) 20 MG tablet Take 2 tablets (40 mg total) by mouth daily. 07/10/13  Yes [name] Lowne, DO  potassium chloride SA (K-DUR,KLOR-CON) 20 MEQ tablet Take 1 tablet (20 mEq total) by mouth daily. 07/10/13  Yes Lelon Perla, DO  SUMAtriptan (IMITREX) 50 MG tablet 1 po  At first sign of headache, may repeat in 2 h x1 03/23/13  Yes Lelon Perla, DO  venlafaxine XR (EFFEXOR-XR) 75 MG 24 hr capsule 3 Tablets daily 02/14/13  Yes Lelon Perla, DO    Family History  Family History  Problem Relation Age of Onset  . Adopted: Yes  . COPD Mother   . Emphysema Mother   . Heart attack Mother   . Other Mother     tobacco abuse    Social History  History   Social History  . Marital Status: Married    Spouse Name: N/A  . Number of Children: 2  . Years of Education: N/A   Occupational History  . Retired    Social History Main Topics  . Smoking status: Never Smoker   . Smokeless tobacco: Never Used  . Alcohol Use: 0.0 oz/week    0 Standard drinks or equivalent per week       Comment: 10/02/2013 "glass of wine or martini maybe once/year"  . Drug Use: No  . Sexual Activity: No   Other Topics Concern  . Not on file   Social History Narrative    Review of Systems General:  No chills, fever, night sweats or weight changes.  Cardiovascular:  No chest pain, + dyspnea on exertion, + B/L lower extremity edema, orthopnea, palpitations, paroxysmal nocturnal dyspnea. Dermatological: No rash, lesions/masses Respiratory: No cough, dyspnea Urologic: No  hematuria, dysuria Abdominal:   No nausea, vomiting, diarrhea, bright red blood per rectum, melena, or hematemesis Neurologic:  No visual changes, wkns, changes in mental status. All other systems reviewed and are otherwise negative except as noted above.  Physical Exam  Filed Vitals:   01/23/15 1636  BP: 132/82  Pulse: 87   General: Pleasant, NAD Psych: Normal affect. Neuro: Alert and oriented X 3. Moves all extremities spontaneously. HEENT: Normal  Neck: Supple without bruits or JVD. Lungs:  Resp regular and unlabored, CTA. Heart: RRR no s3, s4, or murmurs. Abdomen: Soft, non-tender, non-distended, BS + x 4.  Extremities: No clubbing, cyanosis. DP/PT/Radials 2+ and equal bilaterally. B/L lower extremity edema +2 left > right  Accessory Clinical Findings  ECG - SR, HR 86, normal intervals, no ST-T wave changes   Assessment & Plan  69 year old female with HTN, hyperlipidemia with dyspnea on exertion and lower extremity edema.   1. Lower extremity edema L>R, we will discontinue HCTZ, start lasix 40 mg po daily, oredr B/L venous Duplex to rule out DVT. She is encouraged to use compression stockings (she has them already).   Originally believed to be sec to Adderal, after d/c a year ago, it has improved. Adderall is known sympatomimetic and can be associated with fluid retention and cardiomyopathy.  Her echocardiogram shows preserved EF 65% but diastolic dysfunction. Resolved with discontinuation of  Adderal.  2. SOB - resolved, PFTs were normal the last year.  3. Hypertension - controlled now.  Follow up in 2 weeks.  Lars MassonNELSON, Andru Genter H, MD 01/23/2015, 4:51 PM

## 2015-01-23 NOTE — Patient Instructions (Signed)
Your physician has recommended you make the following change in your medication:   *STOP TAKING HYDROCHLOROTHIAZIDE NOW  *START TAKING LASIX 40 MG ONCE DAILY   DR NELSON WOULD LIKE FOR YOU TO START USING YOUR COMPRESSION STOCKINGS EVERYDAY THAT YOU ALREADY HAVE ON HAND    Your physician has requested that you have a lower extremity venous duplex. This test is an ultrasound of the veins in the legs or arms. It looks at venous blood flow that carries blood from the heart to the legs or arms. Allow one hour for a Lower Venous exam. Allow thirty minutes for an Upper Venous exam. There are no restrictions or special instructions.  PLEASE HAVE THIS SCHEDULED FOR TOMORROW 01/24/15.     Your physician recommends that you schedule a follow-up appointment in: 2 WEEKS WITH DR Delton SeeNELSON

## 2015-01-24 ENCOUNTER — Telehealth: Payer: Self-pay | Admitting: Family Medicine

## 2015-01-24 ENCOUNTER — Ambulatory Visit
Admission: RE | Admit: 2015-01-24 | Discharge: 2015-01-24 | Disposition: A | Discharge status: Home / Self Care | Payer: Medicare PPO | Source: Ambulatory Visit | Attending: Neurology | Admitting: Neurology

## 2015-01-24 ENCOUNTER — Ambulatory Visit (HOSPITAL_COMMUNITY): Payer: Medicare PPO | Attending: Cardiology | Admitting: *Deleted

## 2015-01-24 DIAGNOSIS — R609 Edema, unspecified: Secondary | ICD-10-CM | POA: Insufficient documentation

## 2015-01-24 DIAGNOSIS — R600 Localized edema: Secondary | ICD-10-CM

## 2015-01-24 DIAGNOSIS — I82403 Acute embolism and thrombosis of unspecified deep veins of lower extremity, bilateral: Secondary | ICD-10-CM | POA: Insufficient documentation

## 2015-01-24 DIAGNOSIS — R0602 Shortness of breath: Secondary | ICD-10-CM

## 2015-01-24 DIAGNOSIS — R413 Other amnesia: Secondary | ICD-10-CM

## 2015-01-24 NOTE — Telephone Encounter (Signed)
-----   Message from Van ClinesKaren M Aquino, MD sent at 01/24/2015  1:43 PM EDT ----- Please let patient know I reviewed MRI brain and it is unremarkable, no evidence of tumor, stroke, or bleed. thanks

## 2015-01-24 NOTE — Progress Notes (Signed)
Bilateral Lower Extremity Venous Duplex Exam Performed - Negative for DVT 

## 2015-01-24 NOTE — Telephone Encounter (Signed)
Patient was notified of results.  

## 2015-02-12 ENCOUNTER — Ambulatory Visit: Payer: Medicare PPO | Admitting: Cardiology

## 2015-02-12 ENCOUNTER — Telehealth: Payer: Self-pay | Admitting: Family Medicine

## 2015-02-12 NOTE — Telephone Encounter (Signed)
Last seen 12/05/14 and filled 11/15/14 #90 with 2 refills  UDS 04/22/14 low risk.   Please advise    KP

## 2015-02-12 NOTE — Telephone Encounter (Signed)
Caller name:Hae Jimmey RalphParker Relationship to patient:self  Pharmacy: [facility] [address]  Reason for call: Pt requesting refill of klonopin .5mg  last refilled 11/15/14

## 2015-02-13 MED ORDER — CLONAZEPAM 0.5 MG PO TABS
ORAL_TABLET | ORAL | Status: DC
Start: 2015-02-13 — End: 2015-03-13

## 2015-02-13 NOTE — Telephone Encounter (Signed)
Refill x1 

## 2015-02-13 NOTE — Telephone Encounter (Signed)
Rx faxed.    KP 

## 2015-02-19 ENCOUNTER — Ambulatory Visit: Payer: Medicare PPO | Admitting: Cardiology

## 2015-02-21 ENCOUNTER — Telehealth: Payer: Self-pay | Admitting: Family Medicine

## 2015-02-21 DIAGNOSIS — F988 Other specified behavioral and emotional disorders with onset usually occurring in childhood and adolescence: Secondary | ICD-10-CM

## 2015-02-21 NOTE — Telephone Encounter (Signed)
Caller name: Mitchell Sinkrita Relation to pt: self Call back number: [phone] Pharmacy:  Reason for call:   Requesting a new vyvanse rx.

## 2015-02-24 MED ORDER — LISDEXAMFETAMINE DIMESYLATE 30 MG PO CAPS: 30.0000 mg | ORAL_CAPSULE | Freq: Every day | ORAL | Status: DC

## 2015-02-24 NOTE — Telephone Encounter (Signed)
Last seen 12/05/14 and has not filled since 10/23/14 #30 UDS 04/22/14 low risk   Please advise      KP

## 2015-02-24 NOTE — Telephone Encounter (Signed)
[name] aware Rx ready for pick up.     KP

## 2015-02-24 NOTE — Telephone Encounter (Signed)
Refill x1 

## 2015-02-27 ENCOUNTER — Other Ambulatory Visit: Payer: Self-pay | Admitting: Family Medicine

## 2015-03-06 ENCOUNTER — Inpatient Hospital Stay (HOSPITAL_BASED_OUTPATIENT_CLINIC_OR_DEPARTMENT_OTHER)
Admission: EM | Admit: 2015-03-06 | Discharge: 2015-03-08 | DRG: 871 | Disposition: A | Discharge status: Home / Self Care | Payer: Medicare PPO | Attending: Internal Medicine | Admitting: Internal Medicine

## 2015-03-06 ENCOUNTER — Encounter (HOSPITAL_BASED_OUTPATIENT_CLINIC_OR_DEPARTMENT_OTHER): Payer: Self-pay

## 2015-03-06 ENCOUNTER — Ambulatory Visit: Payer: Medicare PPO | Admitting: Family Medicine

## 2015-03-06 DIAGNOSIS — R748 Abnormal levels of other serum enzymes: Secondary | ICD-10-CM | POA: Diagnosis present

## 2015-03-06 DIAGNOSIS — R0600 Dyspnea, unspecified: Secondary | ICD-10-CM | POA: Diagnosis not present

## 2015-03-06 DIAGNOSIS — M4120 Other idiopathic scoliosis, site unspecified: Secondary | ICD-10-CM | POA: Diagnosis present

## 2015-03-06 DIAGNOSIS — E669 Obesity, unspecified: Secondary | ICD-10-CM | POA: Diagnosis present

## 2015-03-06 DIAGNOSIS — R509 Fever, unspecified: Secondary | ICD-10-CM | POA: Diagnosis present

## 2015-03-06 DIAGNOSIS — M810 Age-related osteoporosis without current pathological fracture: Secondary | ICD-10-CM | POA: Diagnosis present

## 2015-03-06 DIAGNOSIS — G250 Essential tremor: Secondary | ICD-10-CM | POA: Diagnosis present

## 2015-03-06 DIAGNOSIS — N390 Urinary tract infection, site not specified: Secondary | ICD-10-CM | POA: Diagnosis present

## 2015-03-06 DIAGNOSIS — Z90710 Acquired absence of both cervix and uterus: Secondary | ICD-10-CM

## 2015-03-06 DIAGNOSIS — F988 Other specified behavioral and emotional disorders with onset usually occurring in childhood and adolescence: Secondary | ICD-10-CM | POA: Diagnosis not present

## 2015-03-06 DIAGNOSIS — Z8701 Personal history of pneumonia (recurrent): Secondary | ICD-10-CM

## 2015-03-06 DIAGNOSIS — I5030 Unspecified diastolic (congestive) heart failure: Secondary | ICD-10-CM | POA: Diagnosis not present

## 2015-03-06 DIAGNOSIS — G8929 Other chronic pain: Secondary | ICD-10-CM | POA: Diagnosis present

## 2015-03-06 DIAGNOSIS — M797 Fibromyalgia: Secondary | ICD-10-CM | POA: Diagnosis present

## 2015-03-06 DIAGNOSIS — E785 Hyperlipidemia, unspecified: Secondary | ICD-10-CM | POA: Diagnosis present

## 2015-03-06 DIAGNOSIS — G9340 Encephalopathy, unspecified: Secondary | ICD-10-CM | POA: Diagnosis present

## 2015-03-06 DIAGNOSIS — G549 Nerve root and plexus disorder, unspecified: Secondary | ICD-10-CM | POA: Diagnosis present

## 2015-03-06 DIAGNOSIS — F419 Anxiety disorder, unspecified: Secondary | ICD-10-CM | POA: Diagnosis present

## 2015-03-06 DIAGNOSIS — M1990 Unspecified osteoarthritis, unspecified site: Secondary | ICD-10-CM | POA: Diagnosis present

## 2015-03-06 DIAGNOSIS — Z9884 Bariatric surgery status: Secondary | ICD-10-CM | POA: Diagnosis not present

## 2015-03-06 DIAGNOSIS — E876 Hypokalemia: Secondary | ICD-10-CM | POA: Diagnosis not present

## 2015-03-06 DIAGNOSIS — A419 Sepsis, unspecified organism: Principal | ICD-10-CM | POA: Diagnosis present

## 2015-03-06 DIAGNOSIS — M549 Dorsalgia, unspecified: Secondary | ICD-10-CM | POA: Diagnosis present

## 2015-03-06 DIAGNOSIS — R251 Tremor, unspecified: Secondary | ICD-10-CM | POA: Diagnosis not present

## 2015-03-06 DIAGNOSIS — F909 Attention-deficit hyperactivity disorder, unspecified type: Secondary | ICD-10-CM | POA: Diagnosis not present

## 2015-03-06 DIAGNOSIS — M609 Myositis, unspecified: Secondary | ICD-10-CM | POA: Diagnosis present

## 2015-03-06 DIAGNOSIS — K219 Gastro-esophageal reflux disease without esophagitis: Secondary | ICD-10-CM | POA: Diagnosis present

## 2015-03-06 DIAGNOSIS — K210 Gastro-esophageal reflux disease with esophagitis: Secondary | ICD-10-CM | POA: Diagnosis not present

## 2015-03-06 DIAGNOSIS — G43909 Migraine, unspecified, not intractable, without status migrainosus: Secondary | ICD-10-CM | POA: Diagnosis present

## 2015-03-06 DIAGNOSIS — I10 Essential (primary) hypertension: Secondary | ICD-10-CM | POA: Diagnosis present

## 2015-03-06 DIAGNOSIS — G252 Other specified forms of tremor: Secondary | ICD-10-CM

## 2015-03-06 DIAGNOSIS — Z7982 Long term (current) use of aspirin: Secondary | ICD-10-CM

## 2015-03-06 DIAGNOSIS — G2579 Other drug induced movement disorders: Secondary | ICD-10-CM

## 2015-03-06 DIAGNOSIS — I519 Heart disease, unspecified: Secondary | ICD-10-CM | POA: Diagnosis present

## 2015-03-06 LAB — CBC WITH DIFFERENTIAL/PLATELET
BASOS ABS: 0 10*3/uL (ref 0.0–0.1)
BASOS PCT: 0 % (ref 0–1)
EOS ABS: 0.2 10*3/uL (ref 0.0–0.7)
Eosinophils Relative: 2 % (ref 0–5)
HCT: 38.3 % (ref 36.0–46.0)
HEMOGLOBIN: 13 g/dL (ref 12.0–15.0)
Lymphocytes Relative: 18 % (ref 12–46)
Lymphs Abs: 1.8 10*3/uL (ref 0.7–4.0)
MCH: 30.7 pg (ref 26.0–34.0)
MCHC: 33.9 g/dL (ref 30.0–36.0)
MCV: 90.3 fL (ref 78.0–100.0)
Monocytes Absolute: 0.9 10*3/uL (ref 0.1–1.0)
Monocytes Relative: 9 % (ref 3–12)
NEUTROS PCT: 71 % (ref 43–77)
Neutro Abs: 7.2 10*3/uL (ref 1.7–7.7)
Platelets: 241 10*3/uL (ref 150–400)
RBC: 4.24 MIL/uL (ref 3.87–5.11)
RDW: 12.3 % (ref 11.5–15.5)
WBC: 10.1 10*3/uL (ref 4.0–10.5)

## 2015-03-06 LAB — BASIC METABOLIC PANEL
ANION GAP: 9 (ref 5–15)
BUN: 12 mg/dL (ref 6–23)
CALCIUM: 8.5 mg/dL (ref 8.4–10.5)
CO2: 30 mmol/L (ref 19–32)
CREATININE: 0.85 mg/dL (ref 0.50–1.10)
Chloride: 99 mmol/L (ref 96–112)
GFR calc non Af Amer: 69 mL/min — ABNORMAL LOW (ref >90)
GFR, EST AFRICAN AMERICAN: 80 mL/min — AB (ref >90)
Glucose, Bld: 106 mg/dL — ABNORMAL HIGH (ref 70–99)
Potassium: 2.7 mmol/L — CL (ref 3.5–5.1)
Sodium: 138 mmol/L (ref 135–145)

## 2015-03-06 MED ORDER — OSELTAMIVIR PHOSPHATE 75 MG PO CAPS
75.0000 mg | ORAL_CAPSULE | Freq: Two times a day (BID) | ORAL | Status: DC
  Administered 2015-03-07 (×2): 75 mg via ORAL
  Filled 2015-03-06 (×3): qty 1

## 2015-03-06 MED ORDER — POTASSIUM CHLORIDE 10 MEQ/100ML IV SOLN
INTRAVENOUS | Status: AC
  Filled 2015-03-06: qty 100

## 2015-03-06 MED ORDER — SODIUM CHLORIDE 0.9 % IV BOLUS (SEPSIS)
1000.0000 mL | Freq: Once | INTRAVENOUS | Status: AC
  Administered 2015-03-07: 1000 mL via INTRAVENOUS

## 2015-03-06 MED ORDER — AZITHROMYCIN 500 MG PO TABS
500.0000 mg | ORAL_TABLET | Freq: Every day | ORAL | Status: AC
  Administered 2015-03-07: 500 mg via ORAL
  Filled 2015-03-06: qty 1

## 2015-03-06 MED ORDER — AZITHROMYCIN 250 MG PO TABS
250.0000 mg | ORAL_TABLET | Freq: Every day | ORAL | Status: DC
  Administered 2015-03-07 – 2015-03-08 (×2): 250 mg via ORAL
  Filled 2015-03-06 (×2): qty 1

## 2015-03-06 MED ORDER — POTASSIUM CHLORIDE 10 MEQ/100ML IV SOLN
10.0000 meq | Freq: Once | INTRAVENOUS | Status: AC
  Administered 2015-03-06: 10 meq via INTRAVENOUS

## 2015-03-06 MED ORDER — SODIUM CHLORIDE 0.9 % IV SOLN
INTRAVENOUS | Status: DC
  Administered 2015-03-07: 01:00:00 via INTRAVENOUS

## 2015-03-06 MED ORDER — MAGNESIUM SULFATE 50 % IJ SOLN
2.0000 g | Freq: Once | INTRAMUSCULAR | Status: AC
  Administered 2015-03-06: 2 g via INTRAVENOUS
  Filled 2015-03-06: qty 4

## 2015-03-06 MED ORDER — ONDANSETRON HCL 4 MG/2ML IJ SOLN: 4.0000 mg | Freq: Three times a day (TID) | INTRAMUSCULAR | Status: DC | PRN

## 2015-03-06 NOTE — ED Notes (Signed)
Family at bedside. 

## 2015-03-06 NOTE — ED Notes (Signed)
MD at bedside. 

## 2015-03-06 NOTE — ED Provider Notes (Signed)
CSN: [id number]     Arrival date & time 03/06/15  4098 History  This chart was scribed for Nelva Nay, MD by Phillis Haggis, ED Scribe. This patient was seen in room MH02/MH02 and patient care was started at 7:00 PM.   Chief Complaint  Patient presents with  . Allergic Reaction   Patient is a 69 y.o. female presenting with allergic reaction. The history is provided by the spouse. The history is limited by the condition of the patient. No language interpreter was used.  Allergic Reaction Presenting symptoms: no difficulty swallowing and no rash     HPI Comments: [name] is a 69 y.o. female who presents to the Emergency Department complaining of a possible allergic reaction onset 2 hours ago. Her husband states that she took 3 Flexeril, Vivance, Oxycodone, and Catering manager at one time. He states that she has a history of spine and knee problems. He states that she also takes Oxymorphone, but did not take it today. Patient could not articulate why she took the medication. Husband reports associated tremors, and states that she has never been like this before.   Past Medical History  Diagnosis Date  . Anxiety   . HYPERLIPIDEMIA 05/15/2007    Qualifier: Diagnosis of  By: Janit Bern ; pt denies this hx on 10/02/2013  . ADD (attention deficit disorder) 05/24/2012  . DIZZINESS 12/31/2009    Qualifier: Diagnosis of  By: Oswald Hillock    . GERD 12/03/2008    Qualifier: Diagnosis of  By: Freddy Jaksch    . Idiopathic scoliosis 04/19/2013  . Osteoporosis, unspecified 04/19/2013  . Pica 12/28/2007    Qualifier: Diagnosis of  By: Janit Bern    . PALPITATIONS, RECURRENT 12/12/2009    Qualifier: Diagnosis of  By: Janit Bern    . PSTPRC STATUS, BARIATRIC SURGERY 05/15/2007    Qualifier: Diagnosis of  By: Janit Bern    . TREMOR, ESSENTIAL 12/12/2009    Qualifier: Diagnosis of  By: Janit Bern    . Unspecified Myalgia and Myositis 05/15/2007    Centricity Description:  FIBROMYALGIA Qualifier: Diagnosis of  By: Janit Bern   Centricity Description: MYALGIA Qualifier: Diagnosis of  By: Janit Bern    . Chronic back pain     "all over my back" (10/02/2013)  . Hypertension   . Crushing injury of arm, right 02/06/1989's    "it was crushed; wore cast from fingers to top of my shoulder" (11/25/2  . Crushing injury of left wrist 05/11/1989's  . Congestive heart disease     "I have the beginning signs" (10/02/2013)  . Pneumonia 10/02/2013    "had onsets of it before; not full blown til now" (10/02/2013)  . ANEMIA, MILD 06/26/2008    Qualifier: Diagnosis of  By: Janit Bern    . Other vitamin B12 deficiency anemia 10/30/2009    Qualifier: Diagnosis of  By: Floydene Flock    . Migraines     "once/month" (10/02/2013)  . Arthritis     "joints, back" (10/02/2013)  . Fibromyalgia   . Pulmonary nodules/lesions, multiple    Past Surgical History  Procedure Laterality Date  . Gastric bypass  ~ 2003  . Abdominal hysterectomy  1980's  . Knee arthroscopy Right 2002  . Tonsillectomy and adenoidectomy  1955  . Myomectomy  1980's  . Appendectomy  1980's    "w/either my hysterectomy or myomectomy" (10/02/2013)  . Tubal ligation  1980's  . Reduction  mammaplasty Bilateral 2001  . Refractive surgery Bilateral 2004  . Closed reduction hand fracture Right 1990's    "it was crushed; wore cast from fingers to top of my shoulder" (10/02/2013)   Family History  Problem Relation Age of Onset  . Adopted: Yes  . COPD Mother   . Emphysema Mother   . Heart attack Mother   . Other Mother     tobacco abuse   History  Substance Use Topics  . Smoking status: Never Smoker   . Smokeless tobacco: Never Used  . Alcohol Use: 0.0 oz/week    0 Standard drinks or equivalent per week     Comment: 10/02/2013 "glass of wine or martini maybe once/year"   OB History    No data available     Review of Systems  HENT: Negative for trouble swallowing.   Respiratory:  Negative for shortness of breath.   Skin: Negative for rash.  Neurological: Positive for tremors.  All other systems reviewed and are negative.  Allergies  Review of patient's allergies indicates no known allergies.  Home Medications   Prior to Admission medications   Medication Sig Start Date End Date Taking? Authorizing Provider  adapalene (DIFFERIN) 0.1 % cream Apply topically at bedtime. Patient taking differently: Apply 1 application topically at bedtime.  04/23/14   Grayling CongressYvonne R Lowne, DO  amLODipine (NORVASC) 5 MG tablet Take 1 tablet (5 mg total) by mouth daily. 03/04/14   Lars MassonKatarina H Nelson, MD  aspirin 81 MG tablet Take 81 mg by mouth daily.    Historical Provider, MD  Cholecalciferol (VITAMIN D3) 2000 UNITS TABS Take 2,000 mg by mouth daily.    Historical Provider, MD  clonazePAM (KLONOPIN) 0.5 MG tablet TAKE ONE (1) TABLET BY MOUTH THREE (3) TIMES EACH DAY AS NEEDED FOR ANXIETY 02/13/15   Lelon PerlaYvonne R Lowne, DO  cyanocobalamin 2000 MCG tablet Take 2,000 mcg by mouth daily.    Historical Provider, MD  cyclobenzaprine (FLEXERIL) 10 MG tablet Take 10 mg by mouth 3 (three) times daily as needed for muscle spasms.    Historical Provider, MD  ferrous sulfate (IRON SUPPLEMENT) 325 (65 FE) MG tablet Take 325 mg by mouth daily as needed (IRON SUPPLEMENT).     Historical Provider, MD  furosemide (LASIX) 40 MG tablet Take 1 tablet (40 mg total) by mouth daily. 01/23/15   Lars MassonKatarina H Nelson, MD  lisdexamfetamine (VYVANSE) 30 MG capsule Take 1 capsule (30 mg total) by mouth daily. 02/24/15   Lelon PerlaYvonne R Lowne, DO  meloxicam (MOBIC) 15 MG tablet 15 mg. Take 1 tablet daily as needed 10/11/14   Historical Provider, MD  OPANA ER, CRUSH RESISTANT, 15 MG T12A Take 15 mg by mouth daily as needed (PAIN).  10/18/14   Historical Provider, MD  oxyCODONE (ROXICODONE) 15 MG immediate release tablet Take 15 mg by mouth every 4 (four) hours as needed for pain.    Historical Provider, MD  SUMAtriptan (IMITREX) 50 MG tablet  TAKE 1 TABLET AT ONSET OF HEADACHE. MAY REPEAT IN 2 HOURS IF NEEDED 02/27/15   Lelon PerlaYvonne R Lowne, DO  Teriparatide, Recombinant, (FORTEO) 600 MCG/2.4ML SOLN Inject 0.08 mLs (20 mcg total) into the skin daily. 06/13/14   Lelon PerlaYvonne R Lowne, DO  venlafaxine XR (EFFEXOR-XR) 75 MG 24 hr capsule TAKE 3 CAPSULES BY MOUTH EVERY DAY 01/06/15   Lelon PerlaYvonne R Lowne, DO   Pulse 104  Temp(Src) 99.5 F (37.5 C) (Oral)  Resp 18  Ht 5\' 5"  (1.651 m)  Wt 215  lb (97.523 kg)  BMI 35.78 kg/m2  SpO2 93% Physical Exam  Constitutional: She is oriented to person, place, and time. She appears well-developed and well-nourished. No distress.  HENT:  Head: Normocephalic and atraumatic.  Eyes: Pupils are equal, round, and reactive to light.  Neck: Normal range of motion.  Cardiovascular: Intact distal pulses.  Tachycardia present.   Pulmonary/Chest: No respiratory distress.  Abdominal: Normal appearance. She exhibits no distension.  Musculoskeletal: Normal range of motion.  Neurological: She is alert and oriented to person, place, and time. She displays tremor. No cranial nerve deficit. GCS eye subscore is 4. GCS verbal subscore is 5. GCS motor subscore is 6.  Reflex Scores:      Patellar reflexes are 3+ on the right side and 3+ on the left side. 2-3 beat clonus  Skin: Skin is warm and dry. No rash noted.  Psychiatric: Her mood appears anxious. She is agitated. She expresses impulsivity.  Nursing note and vitals reviewed.   ED Course  Procedures (including critical care time) CRITICAL CARE Performed by: Nelva Nay L Total critical care time: 45 min Critical care time was exclusive of separately billable procedures and treating other patients. Critical care was necessary to treat or prevent imminent or life-threatening deterioration. Critical care was time spent personally by me on the following activities: development of treatment plan with patient and/or surrogate as well as nursing, discussions with consultants,  evaluation of patient's response to treatment, examination of patient, obtaining history from patient or surrogate, ordering and performing treatments and interventions, ordering and review of laboratory studies, ordering and review of radiographic studies, pulse oximetry and re-evaluation of patient's condition.  Medications  potassium chloride 10 mEq in 100 mL IVPB (10 mEq Intravenous New Bag/Given 03/06/15 2021)  magnesium sulfate (IV Push/IM) injection 2 g (not administered)    DIAGNOSTIC STUDIES: Oxygen Saturation is 93% on room air, adequate by my interpretation.    COORDINATION OF CARE: 7:04 PM-Discussed treatment plan which includes monitoring patient with pt at bedside and pt agreed to plan.   Labs Review Labs Reviewed  BASIC METABOLIC PANEL - Abnormal; Notable for the following:    Potassium 2.7 (*)    Glucose, Bld 106 (*)    GFR calc non Af Amer 69 (*)    GFR calc Af Amer 80 (*)    All other components within normal limits  CBC WITH DIFFERENTIAL/PLATELET    Imaging Review No results found.   EKG Interpretation   Date/Time:  Thursday March 06 2015 19:22:25 EDT Ventricular Rate:  103 PR Interval:  166 QRS Duration: 70 QT Interval:  308 QTC Calculation: 403 R Axis:   -22 Text Interpretation:  Sinus tachycardia with Premature supraventricular  complexes Inferior infarct , age undetermined Anterior infarct , age  undetermined ST \\T \ T wave abnormality, consider lateral ischemia Abnormal  ECG Confirmed by Noemy Hallmon  MD, Ladene Allocca ([id number]) on 03/06/2015 8:20:19 PM      MDM   Final diagnoses:  Serotonin syndrome    I personally performed the services described in this documentation, which was scribed in my presence. The recorded information has been reviewed and considered.     Nelva Nay, MD 03/06/15 [phone]

## 2015-03-06 NOTE — ED Notes (Signed)
Called [facility] 

## 2015-03-06 NOTE — ED Notes (Signed)
Marcello Mooressaac from lab reported to [employer] Julanne Schlueter that Pt. Potassium is 2.7  RN Earlene PlaterDavis reported to [employer] Pt. Nurse.

## 2015-03-06 NOTE — ED Notes (Addendum)
Thinks she is having an allergic reaction.  States she took 2 10mg  Flexeril and a dose of alka-seltzer.  Unknown time of ingestion. Approx 5-6:00.  No difficulty breathing, swallowing, no hives or rash.  Husband states she took 2 Oxycodone and Vyvanse in addition to Flexeril and Alka-seltzer

## 2015-03-07 ENCOUNTER — Inpatient Hospital Stay (HOSPITAL_COMMUNITY): Payer: Medicare PPO

## 2015-03-07 ENCOUNTER — Other Ambulatory Visit: Payer: Medicare PPO

## 2015-03-07 DIAGNOSIS — G9340 Encephalopathy, unspecified: Secondary | ICD-10-CM

## 2015-03-07 DIAGNOSIS — F909 Attention-deficit hyperactivity disorder, unspecified type: Secondary | ICD-10-CM

## 2015-03-07 DIAGNOSIS — K210 Gastro-esophageal reflux disease with esophagitis: Secondary | ICD-10-CM

## 2015-03-07 DIAGNOSIS — E876 Hypokalemia: Secondary | ICD-10-CM | POA: Diagnosis present

## 2015-03-07 DIAGNOSIS — I519 Heart disease, unspecified: Secondary | ICD-10-CM

## 2015-03-07 DIAGNOSIS — A419 Sepsis, unspecified organism: Principal | ICD-10-CM | POA: Diagnosis present

## 2015-03-07 DIAGNOSIS — M810 Age-related osteoporosis without current pathological fracture: Secondary | ICD-10-CM

## 2015-03-07 DIAGNOSIS — R509 Fever, unspecified: Secondary | ICD-10-CM

## 2015-03-07 DIAGNOSIS — R0600 Dyspnea, unspecified: Secondary | ICD-10-CM

## 2015-03-07 LAB — PROTIME-INR
INR: 1.14 (ref 0.00–1.49)
Prothrombin Time: 14.7 seconds (ref 11.6–15.2)

## 2015-03-07 LAB — RAPID URINE DRUG SCREEN, HOSP PERFORMED
Amphetamines: POSITIVE — AB
BENZODIAZEPINES: NOT DETECTED
Barbiturates: NOT DETECTED
Cocaine: NOT DETECTED
Opiates: POSITIVE — AB
Tetrahydrocannabinol: NOT DETECTED

## 2015-03-07 LAB — COMPREHENSIVE METABOLIC PANEL
ALT: 9 U/L (ref 0–35)
AST: 21 U/L (ref 0–37)
Albumin: 3.2 g/dL — ABNORMAL LOW (ref 3.5–5.2)
Alkaline Phosphatase: 90 U/L (ref 39–117)
Anion gap: 10 (ref 5–15)
BILIRUBIN TOTAL: 0.9 mg/dL (ref 0.3–1.2)
BUN: 6 mg/dL (ref 6–23)
CALCIUM: 8.4 mg/dL (ref 8.4–10.5)
CO2: 26 mmol/L (ref 19–32)
Chloride: 104 mmol/L (ref 96–112)
Creatinine, Ser: 0.81 mg/dL (ref 0.50–1.10)
GFR calc Af Amer: 85 mL/min — ABNORMAL LOW (ref >90)
GFR calc non Af Amer: 73 mL/min — ABNORMAL LOW (ref >90)
Glucose, Bld: 112 mg/dL — ABNORMAL HIGH (ref 70–99)
POTASSIUM: 2.9 mmol/L — AB (ref 3.5–5.1)
Sodium: 140 mmol/L (ref 135–145)
Total Protein: 6.3 g/dL (ref 6.0–8.3)

## 2015-03-07 LAB — CBC
HCT: 37.1 % (ref 36.0–46.0)
HEMOGLOBIN: 12.6 g/dL (ref 12.0–15.0)
MCH: 30.3 pg (ref 26.0–34.0)
MCHC: 34 g/dL (ref 30.0–36.0)
MCV: 89.2 fL (ref 78.0–100.0)
PLATELETS: 194 10*3/uL (ref 150–400)
RBC: 4.16 MIL/uL (ref 3.87–5.11)
RDW: 12.7 % (ref 11.5–15.5)
WBC: 10 10*3/uL (ref 4.0–10.5)

## 2015-03-07 LAB — URINALYSIS, ROUTINE W REFLEX MICROSCOPIC
Bilirubin Urine: NEGATIVE
Glucose, UA: NEGATIVE mg/dL
Hgb urine dipstick: NEGATIVE
Ketones, ur: NEGATIVE mg/dL
NITRITE: POSITIVE — AB
Protein, ur: NEGATIVE mg/dL
SPECIFIC GRAVITY, URINE: 1.018 (ref 1.005–1.030)
Urobilinogen, UA: 1 mg/dL (ref 0.0–1.0)
pH: 6.5 (ref 5.0–8.0)

## 2015-03-07 LAB — INFLUENZA PANEL BY PCR (TYPE A & B)
H1N1 flu by pcr: NOT DETECTED
INFLAPCR: NEGATIVE
Influenza B By PCR: NEGATIVE

## 2015-03-07 LAB — TROPONIN I
Troponin I: <0.03 ng/mL (ref <0.031)
Troponin I: 0.05 ng/mL — ABNORMAL HIGH (ref <0.031)
Troponin I: 0.04 ng/mL — ABNORMAL HIGH (ref <0.031)
Troponin I: 0.03 ng/mL (ref <0.031)
Troponin I: <0.03 ng/mL (ref <0.031)

## 2015-03-07 LAB — BRAIN NATRIURETIC PEPTIDE: B Natriuretic Peptide: 215.8 pg/mL — ABNORMAL HIGH (ref 0.0–100.0)

## 2015-03-07 LAB — URINE MICROSCOPIC-ADD ON

## 2015-03-07 LAB — STREP PNEUMONIAE URINARY ANTIGEN: STREP PNEUMO URINARY ANTIGEN: NEGATIVE

## 2015-03-07 LAB — APTT: APTT: 37 s (ref 24–37)

## 2015-03-07 LAB — LACTIC ACID, PLASMA
Lactic Acid, Venous: 1 mmol/L (ref 0.5–2.0)
Lactic Acid, Venous: 0.7 mmol/L (ref 0.5–2.0)

## 2015-03-07 LAB — PROCALCITONIN: Procalcitonin: <0.1 ng/mL

## 2015-03-07 LAB — HIV ANTIBODY (ROUTINE TESTING W REFLEX): HIV SCREEN 4TH GENERATION: NONREACTIVE

## 2015-03-07 LAB — MRSA PCR SCREENING: MRSA by PCR: NEGATIVE

## 2015-03-07 LAB — GLUCOSE, CAPILLARY: Glucose-Capillary: 82 mg/dL (ref 70–99)

## 2015-03-07 LAB — CK: CK TOTAL: 98 U/L (ref 7–177)

## 2015-03-07 MED ORDER — MELOXICAM 15 MG PO TABS
15.0000 mg | ORAL_TABLET | Freq: Every day | ORAL | Status: DC | PRN
  Filled 2015-03-07: qty 1

## 2015-03-07 MED ORDER — CLONAZEPAM 0.5 MG PO TABS
0.2500 mg | ORAL_TABLET | Freq: Three times a day (TID) | ORAL | Status: DC | PRN
  Administered 2015-03-07: 0.25 mg via ORAL
  Filled 2015-03-07: qty 1

## 2015-03-07 MED ORDER — MORPHINE SULFATE ER 15 MG PO TBCR: 45.0000 mg | EXTENDED_RELEASE_TABLET | Freq: Every day | ORAL | Status: DC | PRN

## 2015-03-07 MED ORDER — VENLAFAXINE HCL ER 75 MG PO CP24
225.0000 mg | ORAL_CAPSULE | Freq: Every day | ORAL | Status: DC
  Administered 2015-03-08: 225 mg via ORAL
  Filled 2015-03-07 (×2): qty 1

## 2015-03-07 MED ORDER — ACETAMINOPHEN 325 MG PO TABS: 650.0000 mg | ORAL_TABLET | Freq: Four times a day (QID) | ORAL | Status: DC | PRN

## 2015-03-07 MED ORDER — MORPHINE SULFATE 2 MG/ML IJ SOLN: 1.0000 mg | INTRAMUSCULAR | Status: DC | PRN

## 2015-03-07 MED ORDER — POTASSIUM CHLORIDE CRYS ER 20 MEQ PO TBCR
40.0000 meq | EXTENDED_RELEASE_TABLET | Freq: Once | ORAL | Status: AC
  Administered 2015-03-07: 40 meq via ORAL
  Filled 2015-03-07: qty 2

## 2015-03-07 MED ORDER — CEFTRIAXONE SODIUM IN DEXTROSE 20 MG/ML IV SOLN
1.0000 g | INTRAVENOUS | Status: DC
  Administered 2015-03-07 – 2015-03-08 (×2): 1 g via INTRAVENOUS
  Filled 2015-03-07 (×2): qty 50

## 2015-03-07 MED ORDER — VENLAFAXINE HCL ER 75 MG PO CP24
225.0000 mg | ORAL_CAPSULE | Freq: Every day | ORAL | Status: DC
Start: 2015-03-08 — End: 2015-03-07
  Filled 2015-03-07: qty 1

## 2015-03-07 MED ORDER — ALUM & MAG HYDROXIDE-SIMETH 200-200-20 MG/5ML PO SUSP: 30.0000 mL | Freq: Four times a day (QID) | ORAL | Status: DC | PRN

## 2015-03-07 MED ORDER — POTASSIUM CHLORIDE 10 MEQ/100ML IV SOLN
10.0000 meq | INTRAVENOUS | Status: AC
  Administered 2015-03-07 (×2): 10 meq via INTRAVENOUS
  Filled 2015-03-07 (×2): qty 100

## 2015-03-07 MED ORDER — GUAIFENESIN ER 600 MG PO TB12
600.0000 mg | ORAL_TABLET | Freq: Two times a day (BID) | ORAL | Status: DC
Start: 2015-03-07 — End: 2015-03-08
  Administered 2015-03-07 – 2015-03-08 (×2): 600 mg via ORAL
  Filled 2015-03-07 (×3): qty 1

## 2015-03-07 MED ORDER — ASPIRIN 81 MG PO CHEW
81.0000 mg | CHEWABLE_TABLET | Freq: Every day | ORAL | Status: DC
  Administered 2015-03-07 – 2015-03-08 (×2): 81 mg via ORAL
  Filled 2015-03-07 (×2): qty 1

## 2015-03-07 MED ORDER — ACETAMINOPHEN 650 MG RE SUPP
650.0000 mg | Freq: Four times a day (QID) | RECTAL | Status: DC | PRN
Start: 2015-03-07 — End: 2015-03-08

## 2015-03-07 MED ORDER — FERROUS SULFATE 325 (65 FE) MG PO TABS
325.0000 mg | ORAL_TABLET | Freq: Every day | ORAL | Status: DC | PRN
  Filled 2015-03-07: qty 1

## 2015-03-07 MED ORDER — CYCLOBENZAPRINE HCL 10 MG PO TABS
10.0000 mg | ORAL_TABLET | Freq: Three times a day (TID) | ORAL | Status: DC | PRN
Start: 2015-03-07 — End: 2015-03-07

## 2015-03-07 MED ORDER — SODIUM CHLORIDE 0.9 % IJ SOLN
3.0000 mL | Freq: Two times a day (BID) | INTRAMUSCULAR | Status: DC
  Administered 2015-03-07 – 2015-03-08 (×4): 3 mL via INTRAVENOUS

## 2015-03-07 MED ORDER — ACETAMINOPHEN 325 MG PO TABS
650.0000 mg | ORAL_TABLET | Freq: Four times a day (QID) | ORAL | Status: DC | PRN
  Administered 2015-03-07 (×2): 650 mg via ORAL
  Filled 2015-03-07 (×2): qty 2

## 2015-03-07 MED ORDER — OXYCODONE HCL 5 MG PO TABS
15.0000 mg | ORAL_TABLET | ORAL | Status: DC | PRN
  Administered 2015-03-07 – 2015-03-08 (×5): 15 mg via ORAL
  Filled 2015-03-07 (×6): qty 3

## 2015-03-07 MED ORDER — ADAPALENE 0.1 % EX CREA: TOPICAL_CREAM | Freq: Every day | CUTANEOUS | Status: DC

## 2015-03-07 MED ORDER — SODIUM CHLORIDE 0.9 % IV SOLN
INTRAVENOUS | Status: DC
  Administered 2015-03-07: 11:00:00 via INTRAVENOUS

## 2015-03-07 MED ORDER — TERIPARATIDE (RECOMBINANT) 600 MCG/2.4ML ~~LOC~~ SOLN: 20.0000 ug | Freq: Every day | SUBCUTANEOUS | Status: DC

## 2015-03-07 MED ORDER — POLYETHYLENE GLYCOL 3350 17 G PO PACK: 17.0000 g | PACK | Freq: Every day | ORAL | Status: DC

## 2015-03-07 MED ORDER — POTASSIUM CHLORIDE 20 MEQ/15ML (10%) PO SOLN
40.0000 meq | Freq: Once | ORAL | Status: AC
  Administered 2015-03-07: 40 meq via ORAL
  Filled 2015-03-07: qty 30

## 2015-03-07 MED ORDER — VITAMIN B-12 1000 MCG PO TABS
2000.0000 ug | ORAL_TABLET | Freq: Every day | ORAL | Status: DC
  Administered 2015-03-07 – 2015-03-08 (×2): 2000 ug via ORAL
  Filled 2015-03-07 (×2): qty 2

## 2015-03-07 MED ORDER — HYDRALAZINE HCL 20 MG/ML IJ SOLN: 5.0000 mg | INTRAMUSCULAR | Status: DC | PRN

## 2015-03-07 MED ORDER — AMLODIPINE BESYLATE 5 MG PO TABS
5.0000 mg | ORAL_TABLET | Freq: Every day | ORAL | Status: DC
  Administered 2015-03-07 – 2015-03-08 (×2): 5 mg via ORAL
  Filled 2015-03-07 (×2): qty 1

## 2015-03-07 MED ORDER — VITAMIN D 1000 UNITS PO TABS
2000.0000 [IU] | ORAL_TABLET | Freq: Every day | ORAL | Status: DC
  Administered 2015-03-07 – 2015-03-08 (×2): 2000 [IU] via ORAL
  Filled 2015-03-07 (×3): qty 2

## 2015-03-07 MED ORDER — CYCLOBENZAPRINE HCL 10 MG PO TABS
10.0000 mg | ORAL_TABLET | Freq: Three times a day (TID) | ORAL | Status: DC | PRN
  Administered 2015-03-07 (×2): 10 mg via ORAL
  Filled 2015-03-07 (×2): qty 1

## 2015-03-07 MED ORDER — OXYMORPHONE HCL ER 15 MG PO T12A: 15.0000 mg | EXTENDED_RELEASE_TABLET | Freq: Every day | ORAL | Status: DC | PRN

## 2015-03-07 MED ORDER — HEPARIN SODIUM (PORCINE) 5000 UNIT/ML IJ SOLN
5000.0000 [IU] | Freq: Three times a day (TID) | INTRAMUSCULAR | Status: DC
  Administered 2015-03-07 – 2015-03-08 (×5): 5000 [IU] via SUBCUTANEOUS
  Filled 2015-03-07 (×9): qty 1

## 2015-03-07 NOTE — H&P (Addendum)
[facility] History and Physical  [name] ZOX:[id number] DOB: 07-Jun-1946 DOA: 03/06/2015  Referring physician: ED physician PCP: Loreen Freud, DO  Specialists:   Chief Complaint: MAS, fever, sore throat  HPI: [name] is a 69 y.o. female with past medical history of hypertension, GERD, depression, anxiety, history of essential tremor, congestive heart failure, migraine headache, anemia, who presents with altered mental status, fever and sore throat  Patient initially presented to [facility] emergency room with altered mental status, but her mental status improved significantly. At arrival to the floor, her mental status came back to baseline. She is oriented 3 when I saw patient in the floor. Patient is accompanied by her husband. Per her husband, she took 3 Flexeril, Vivance, Oxycodone, and Alka Seltzer at one time for spine and knee problems last night. Patient became confused and tremulous. She was found to have fever and suspected to have serotonin syndrome by ED, therefore she was transferred to stepdown unit.   When I saw patient on the floor, she is sneezing. Detailed questioning revealed that she has been having runny nose, sore throat and mild cough since this AM. No chest pain or shortness breath. She has a sick contact with Grandgrandson who has cold recently. She has productive cough with greenish colored sputum. She told me that has always been taking her pain medications all together in the night for long time, since she dose not take it during the day. She did not have problem in the past.   ROS: She has fever and chills. She has leg edema. Currently patient denies ear pain, headaches, chest pain, SOB, abdominal pain, diarrhea, constipation, dysuria, urgency, frequency, hematuria, skin rashes. No unilateral weakness, numbness or tingling sensations. No vision change or hearing loss.  In ED, patient was found to have CT angiogram negative for PE or PNA.  WBC 10.1, temperature 99.5, heart rate 104, blood pressure 139/85, potassium 2.7, renal function okay. Patient is admitted to inpatient for further evaluation and treatment.  Review of Systems: As presented in the history of presenting illness, rest negative.  Where does patient live?  At home Can patient participate in ADLs? Yes  Allergy: No Known Allergies  Past Medical History  Diagnosis Date  . Anxiety   . HYPERLIPIDEMIA 05/15/2007    Qualifier: Diagnosis of  By: Janit Bern ; pt denies this hx on 10/02/2013  . ADD (attention deficit disorder) 05/24/2012  . DIZZINESS 12/31/2009    Qualifier: Diagnosis of  By: Oswald Hillock    . GERD 12/03/2008    Qualifier: Diagnosis of  By: Freddy Jaksch    . Idiopathic scoliosis 04/19/2013  . Osteoporosis, unspecified 04/19/2013  . Pica 12/28/2007    Qualifier: Diagnosis of  By: Janit Bern    . PALPITATIONS, RECURRENT 12/12/2009    Qualifier: Diagnosis of  By: Janit Bern    . PSTPRC STATUS, BARIATRIC SURGERY 05/15/2007    Qualifier: Diagnosis of  By: Janit Bern    . TREMOR, ESSENTIAL 12/12/2009    Qualifier: Diagnosis of  By: Janit Bern    . Unspecified Myalgia and Myositis 05/15/2007    Centricity Description: FIBROMYALGIA Qualifier: Diagnosis of  By: Janit Bern   Centricity Description: MYALGIA Qualifier: Diagnosis of  By: Janit Bern    . Chronic back pain     "all over my back" (10/02/2013)  . Hypertension   . Crushing injury of arm, right 02/06/1989's    "it  was crushed; wore cast from fingers to top of my shoulder" (11/25/2  . Crushing injury of left wrist 05/11/1989's  . Congestive heart disease     "I have the beginning signs" (10/02/2013)  . Pneumonia 10/02/2013    "had onsets of it before; not full blown til now" (10/02/2013)  . ANEMIA, MILD 06/26/2008    Qualifier: Diagnosis of  By: Janit BernLowne DO, Yvonne    . Other vitamin B12 deficiency anemia 10/30/2009    Qualifier: Diagnosis of  By: Floydene FlockHoops, Regina     . Migraines     "once/month" (10/02/2013)  . Arthritis     "joints, back" (10/02/2013)  . Fibromyalgia   . Pulmonary nodules/lesions, multiple     Past Surgical History  Procedure Laterality Date  . Gastric bypass  ~ 2003  . Abdominal hysterectomy  1980's  . Knee arthroscopy Right 2002  . Tonsillectomy and adenoidectomy  1955  . Myomectomy  1980's  . Appendectomy  1980's    "w/either my hysterectomy or myomectomy" (10/02/2013)  . Tubal ligation  1980's  . Reduction mammaplasty Bilateral 2001  . Refractive surgery Bilateral 2004  . Closed reduction hand fracture Right 1990's    "it was crushed; wore cast from fingers to top of my shoulder" (10/02/2013)    Social History:  reports that she has never smoked. She has never used smokeless tobacco. She reports that she drinks alcohol. She reports that she does not use illicit drugs.  Family History:  Family History  Problem Relation Age of Onset  . Adopted: Yes  . COPD Mother   . Emphysema Mother   . Heart attack Mother   . Other Mother     tobacco abuse     Prior to Admission medications   Medication Sig Start Date End Date Taking? Authorizing Provider  adapalene (DIFFERIN) 0.1 % cream Apply topically at bedtime. Patient taking differently: Apply 1 application topically at bedtime.  04/23/14   Grayling CongressYvonne R Lowne, DO  amLODipine (NORVASC) 5 MG tablet Take 1 tablet (5 mg total) by mouth daily. 03/04/14   Lars MassonKatarina H Nelson, MD  aspirin 81 MG tablet Take 81 mg by mouth daily.    Historical Provider, MD  Cholecalciferol (VITAMIN D3) 2000 UNITS TABS Take 2,000 mg by mouth daily.    Historical Provider, MD  clonazePAM (KLONOPIN) 0.5 MG tablet TAKE ONE (1) TABLET BY MOUTH THREE (3) TIMES EACH DAY AS NEEDED FOR ANXIETY 02/13/15   Lelon PerlaYvonne R Lowne, DO  cyanocobalamin 2000 MCG tablet Take 2,000 mcg by mouth daily.    Historical Provider, MD  cyclobenzaprine (FLEXERIL) 10 MG tablet Take 10 mg by mouth 3 (three) times daily as needed for muscle  spasms.    Historical Provider, MD  ferrous sulfate (IRON SUPPLEMENT) 325 (65 FE) MG tablet Take 325 mg by mouth daily as needed (IRON SUPPLEMENT).     Historical Provider, MD  furosemide (LASIX) 40 MG tablet Take 1 tablet (40 mg total) by mouth daily. 01/23/15   Lars MassonKatarina H Nelson, MD  lisdexamfetamine (VYVANSE) 30 MG capsule Take 1 capsule (30 mg total) by mouth daily. 02/24/15   Lelon PerlaYvonne R Lowne, DO  meloxicam (MOBIC) 15 MG tablet 15 mg. Take 1 tablet daily as needed 10/11/14   Historical Provider, MD  OPANA ER, CRUSH RESISTANT, 15 MG T12A Take 15 mg by mouth daily as needed (PAIN).  10/18/14   Historical Provider, MD  oxyCODONE (ROXICODONE) 15 MG immediate release tablet Take 15 mg by mouth every 4 (four)  hours as needed for pain.    Historical Provider, MD  SUMAtriptan (IMITREX) 50 MG tablet TAKE 1 TABLET AT ONSET OF HEADACHE. MAY REPEAT IN 2 HOURS IF NEEDED 02/27/15   Lelon Perla, DO  Teriparatide, Recombinant, (FORTEO) 600 MCG/2.4ML SOLN Inject 0.08 mLs (20 mcg total) into the skin daily. 06/13/14   Lelon Perla, DO  venlafaxine XR (EFFEXOR-XR) 75 MG 24 hr capsule TAKE 3 CAPSULES BY MOUTH EVERY DAY 01/06/15   Lelon Perla, DO    Physical Exam: Filed Vitals:   03/06/15 1902 03/06/15 2115 03/06/15 2300 03/07/15 0219  BP: 139/85 140/88 145/74 132/80  Pulse:  103 101 92  Temp:   102.7 F (39.3 C) 98.9 F (37.2 C)  TempSrc:   Oral Oral  Resp:  Height:    (1.651 m)   Weight:   93.8 kg (206 lb 12.7 oz)   SpO2:  97% 93% 90%   General: Not in acute distress HEENT:       Eyes: PERRL, EOMI, no scleral icterus       ENT: No discharge from the ears and nose, no pharynx injection, no tonsillar enlargement.        Neck: No JVD, no bruit, no mass felt. Cardiac: S1/S2, RRR, No murmurs, No gallops or rubs Pulm: has coarse breathing sound bilaterally, No rales, wheezing, rhonchi or rubs. Abd: Soft, nondistended, nontender, no rebound pain, no organomegaly, BS present Ext:  trace  edema bilaterally. 2+DP/PT pulse bilaterally Musculoskeletal: No joint deformities, erythema, or stiffness, ROM full Skin: No rashes.  Neuro: Alert and oriented X3, cranial nerves II-XII grossly intact, muscle strength 5/5 in all extremeties, sensation to light touch intact. Brachial reflex 2+ bilaterally. Knee reflex 1+ bilaterally. Negative Babinski's sign. Normal finger to nose test. Psych: Patient is not psychotic, no suicidal or hemocidal ideation.  Labs on Admission:  Basic Metabolic Panel:  Recent Labs Lab 03/06/15 1920  NA 138  K 2.7*  CL 99  CO2 30  GLUCOSE 106*  BUN 12  CREATININE 0.85  CALCIUM 8.5   Liver Function Tests: No results for input(s): AST, ALT, ALKPHOS, BILITOT, PROT, ALBUMIN in the last 168 hours. No results for input(s): LIPASE, AMYLASE in the last 168 hours. No results for input(s): AMMONIA in the last 168 hours. CBC:  Recent Labs Lab 03/06/15 1920  WBC 10.1  NEUTROABS 7.2  HGB 13.0  HCT 38.3  MCV 90.3  PLT 241   Cardiac Enzymes:  Recent Labs Lab 03/07/15 0057  CKTOTAL 98  TROPONINI <0.03    BNP (last 3 results) No results for input(s): BNP in the last 8760 hours.  ProBNP (last 3 results) No results for input(s): PROBNP in the last 8760 hours.  CBG: No results for input(s): GLUCAP in the last 168 hours.  Radiological Exams on Admission: No results found.  EKG: Independently reviewed.  Abnormal findings:  Left axis deviation, PAC, tachycardia Assessment/Plan Principal Problem:   Fever Active Problems:   Morbid obesity   TREMOR, ESSENTIAL   ADD (attention deficit disorder)   Mild diastolic dysfunction   Back pain   Osteoporosis   Obesity (BMI 30-39.9)   Acute encephalopathy   Sepsis   Hypokalemia  Addendum: UA is positive-->wills start recephine IV  Fever: Patient has fever, runny nose, sore throat and sick contact, likely due to flu. Atypical infection cannot be ruled out. No pneumonia on CTA. Patient was initially  suspected to have serotonin syndrome, which is less  likely since we have other explanation for her fever.  admitted to SUD -start tamiflu now -start Z-pack - Urine legionella and S. pneumococcal antigen - Follow up blood culture x2, sputum culture and respiratory virus panel, plus Flu pcr  Acute encephalopathy: Likely due to sepsis, rather than serotonin syndrome though we cannot completely rule out this possibility. It has resolved already. -hold Vyvanse, sumatriptan, Effexor, Flexeril -neuro check q4h -decrease the dose of her Klonopin from 0.5-0.25 Tid prn -check ck  Sepsis: With fever and tachycardia. Secondary to possible flu or atypical infection -will get Procalcitonin and trend lactic acid level per sepsis protocol -IVF: received 1L of NS bolus in ED, followed by 125 mL per hour of normal saline (dCHF limits aggressive IV fluids)  dCHF: 2-D echo on 12/27/12 showed a year for 55-60% with grade 1 diastolic dysfunction. The patient is on Lasix at home. Patient has trace amount of leg edema, but no signs of acute exacerbation. -Hold Lasix since patient has sepsis, needs IV fluid -check BNP -ASA  Hypokalemia: Potassium 2.7 on admission.  -Repleted -2 g of magnesium sulfate was also given.  Hypertension: -Hold amlodipine due to sepsis -hydralazine when necessary  Chronic pain: -Continue oxymorphone, mobic   DVT ppx: SQ Heparin  Code Status: Full code Family Communication:  Yes, patient's   husband  at bed side Disposition Plan: Admit to inpatient   Date of Service 03/07/2015    Lorretta Harp [facility] Pager [phone]  If 7PM-7AM, please contact night-coverage [email or web address] Password [facility] 03/07/2015, 2:34 AM

## 2015-03-07 NOTE — Progress Notes (Signed)
[facility] PROGRESS NOTE  Molly HawthorneRita C Fischer EAV:[id number] DOB: [birth year] DOA: 03/06/2015 PCP: Loreen FreudYvonne Lowne, DO  Assessment/Plan: 1-Acute encephalopathy; presents with fever, tremors, agitation.  Might be related to medications, fever.  Resolved.  Will resume chronic pain medications today. Effexor tomorrow. ,   2-Fever, sepsis;  Presents with fever, tachycardia.  Continue with IV antibiotics. Ceftriaxone to over for UTI. Zithromax to cover for PNA.  Will check Chest x ray.  Influenza panel negative. Will stop tamiflu.  Blood culture pending.   3-Mild elevated troponin; related to tachycardia, stress. Will continue to cycle enzymes. No chest pain. Will check ECHO.   4-hypokalemia; received 2 runs KCL iv and one dose oral. Will order another dose of KCl/   5-Chronic pain: -Continue oxymorphone, mobic  6-HTN; BP increasing. Will resume Norvasc.    Code Status: full code.  Family Communication: Care discussed with patient and husband who was at bedside.  Disposition Plan: expect 2 to 3 days inpatient   Consultants:  none  Procedures:  ECHO; pending.   Antibiotics:  Ceftriaxone 4-28  Azithromycin 4-28  HPI/Subjective: Patient is alert and oriented, back to baseline per husband.  She is complaining of her chronic knee pain. She would like her pain medications to be restarted.  Denies cough.   Objective: Filed Vitals:   03/07/15 1200  BP: 144/98  Pulse: 79  Temp: 98.2 F (36.8 C)  Resp: 20    Intake/Output Summary (Last 24 hours) at 03/07/15 1338 Last data filed at 03/07/15 1200  Gross per 24 hour  Intake   1628 ml  Output   1850 ml  Net   -222 ml   Filed Weights   03/06/15 1858 03/06/15 2300 03/07/15 0449  Weight: 97.523 kg (215 lb) 93.8 kg (206 lb 12.7 oz) 98.3 kg (216 lb 11.4 oz)    Exam:   General:  Alert in no distress.   Cardiovascular: S 1, S 2 RRR  Respiratory: CTA  Abdomen: BS present, soft, nt  Musculoskeletal: no edema.    Data Reviewed: Basic Metabolic Panel:  Recent Labs Lab 03/06/15 1920 03/07/15 0230  NA 138 140  K 2.7* 2.9*  CL 99 104  CO2 30 26  GLUCOSE 106* 112*  BUN 12 6  CREATININE 0.85 0.81  CALCIUM 8.5 8.4   Liver Function Tests:  Recent Labs Lab 03/07/15 0230  AST 21  ALT 9  ALKPHOS 90  BILITOT 0.9  PROT 6.3  ALBUMIN 3.2*   No results for input(s): LIPASE, AMYLASE in the last 168 hours. No results for input(s): AMMONIA in the last 168 hours. CBC:  Recent Labs Lab 03/06/15 1920 03/07/15 0230  WBC 10.1 10.0  NEUTROABS 7.2  --   HGB 13.0 12.6  HCT 38.3 37.1  MCV 90.3 89.2  PLT 241 194   Cardiac Enzymes:  Recent Labs Lab 03/07/15 0057 03/07/15 0230 03/07/15 1110  CKTOTAL 98  --   --   TROPONINI <0.03 0.05* 0.04*   BNP (last 3 results)  Recent Labs  03/07/15 0230  BNP 215.8*    ProBNP (last 3 results) No results for input(s): PROBNP in the last 8760 hours.  CBG:  Recent Labs Lab 03/07/15 0712  GLUCAP 82    Recent Results (from the past 240 hour(s))  MRSA PCR Screening     Status: None   Collection Time: 03/07/15 12:08 AM  Result Value Ref Range Status   MRSA by PCR NEGATIVE NEGATIVE Final    Comment:  The GeneXpert MRSA Assay (FDA approved for NASAL specimens only), is one component of a comprehensive MRSA colonization surveillance program. It is not intended to diagnose MRSA infection nor to guide or monitor treatment for MRSA infections.      Studies: Dg Chest 2 View  03/07/2015   CLINICAL DATA:  Fever and shortness of breath.  Hoarseness  EXAM: CHEST  2 VIEW  COMPARISON:  03/05/2014  FINDINGS: Chronic elevation of the right diaphragm with lateral morphology suggesting eventration. Normal heart size and stable aortic tortuosity. There is no edema, consolidation, effusion, or pneumothorax. Remote T8 and T12 compression fractures.  IMPRESSION: 1. No active cardiopulmonary disease. 2. Eventration of the right diaphragm with mild  overlying atelectasis. 3. Remote thoracic compression fractures.   Electronically Signed   By: Marnee Spring M.D.   On: 03/07/2015 10:44    Scheduled Meds: . amLODipine  5 mg Oral Daily  . aspirin  81 mg Oral Daily  . azithromycin  250 mg Oral Daily  . cefTRIAXone (ROCEPHIN)  IV  1 g Intravenous Q24H  . cholecalciferol  2,000 Units Oral Daily  . heparin  5,000 Units Subcutaneous 3 times per day  . oseltamivir  75 mg Oral BID  . potassium chloride  40 mEq Oral Once  . sodium chloride  3 mL Intravenous Q12H  . [START ON 03/08/2015] venlafaxine XR  225 mg Oral Daily  . cyanocobalamin  2,000 mcg Oral Daily   Continuous Infusions: . sodium chloride 100 mL/hr at 03/07/15 1116    Principal Problem:   Fever Active Problems:   Morbid obesity   TREMOR, ESSENTIAL   ADD (attention deficit disorder)   Mild diastolic dysfunction   Back pain   Osteoporosis   Obesity (BMI 30-39.9)   Acute encephalopathy   Sepsis   Hypokalemia    Time spent: 35 minutes.     Hartley Barefoot A  [facility] Pager [phone]. If 7PM-7AM, please contact night-coverage at [email or web address], password [facility] 03/07/2015, 1:38 PM  LOS: 1 day

## 2015-03-07 NOTE — Progress Notes (Signed)
Medicare Important Message given? YES (If response is "NO", the following Medicare IM given date fields will be blank) Date Medicare IM given:03/07/2015 Medicare IM given by: Tionna Gigante 

## 2015-03-07 NOTE — Evaluation (Signed)
Physical Therapy Evaluation Patient Details Name: [name] MRN: [id number] DOB: [birth year] Today's Date: 03/07/2015   History of Present Illness  Patient is is a 69 y.o. female with past medical history of hypertension, GERD, depression, anxiety, history of essential tremor, congestive heart failure, migraine headache, anemia, who presents with altered mental status, fever and sore throat  Clinical Impression  Patient presents with decreased mobility due to deficits listed in PT problem list.  She will benefit from skilled PT in the acute setting to allow return home with intermittent family assist.  No current needs for follow up PT, but spoke with patient to discuss with her spine doctor about referral for rehab for her back.    Follow Up Recommendations No PT follow up    Equipment Recommendations  None recommended by PT    Recommendations for Other Services       Precautions / Restrictions Precautions Precautions: Fall Precaution Comments: due to spine and knee issues      Mobility  Bed Mobility Overal bed mobility: Modified Independent                Transfers Overall transfer level: Modified independent                  Ambulation/Gait Ambulation/Gait assistance: Supervision Ambulation Distance (Feet): 120 Feet Assistive device: Rolling walker (2 wheeled) Gait Pattern/deviations: Trunk flexed;Step-through pattern;Decreased stride length     General Gait Details: keeps walker at a distance due to flexed posture; reports prolonged h/o spine pain and plans for neural stimulator.  Can walk in room short distance without walker  Stairs            Wheelchair Mobility    Modified Rankin (Stroke Patients Only)       Balance Overall balance assessment: Needs assistance         Standing balance support: No upper extremity supported Standing balance-Leahy Scale: Fair Standing balance comment: washes hands at sink without UE support no loss  of balance                             Pertinent Vitals/Pain Pain Assessment: 0-10 Pain Score: 6  Pain Location: back Pain Intervention(s): Monitored during session    Home Living Family/patient expects to be discharged to:: Private residence Living Arrangements: Spouse/significant other Available Help at Discharge: Family;Available PRN/intermittently (spouse works out of town, Information systems manager staying with pt while in school) Type of Home: House Home Access: Level entry     Home Layout: Two level;Able to live on main level with bedroom/bathroom Home Equipment: Gilmer Mor - single point;Walker - 2 wheels      Prior Function Level of Independence: Independent with assistive device(s)         Comments: has bad days with bad knees and bad back and limits ability to do things; reports only takes a shower if someone is there      Hand Dominance        Extremity/Trunk Assessment               Lower Extremity Assessment: RLE deficits/detail;LLE deficits/detail RLE Deficits / Details: AROM grossly WFL, knee painful, strength grossly 4/5 LLE Deficits / Details: AROM grossly WFL, knee painful, strength grossly 4/5  Cervical / Trunk Assessment: Lordotic  Communication   Communication: No difficulties  Cognition Arousal/Alertness: Awake/alert Behavior During Therapy: WFL for tasks assessed/performed Overall Cognitive Status: Within Functional Limits for tasks assessed  General Comments General comments (skin integrity, edema, etc.): Discussed with patient speaking to spine doctor about possiblity of PT helping with back pain/weakness    Exercises        Assessment/Plan    PT Assessment Patient needs continued PT services  PT Diagnosis Generalized weakness   PT Problem List Decreased knowledge of use of DME;Decreased mobility;Decreased strength;Decreased activity tolerance  PT Treatment Interventions DME instruction;Therapeutic  exercise;Gait training;Functional mobility training;Therapeutic activities;Patient/family education   PT Goals (Current goals can be found in the Care Plan section) Acute Rehab PT Goals Patient Stated Goal: To go home PT Goal Formulation: With patient Time For Goal Achievement: 03/14/15 Potential to Achieve Goals: Good    Frequency Min 3X/week   Barriers to discharge        Co-evaluation               End of Session Equipment Utilized During Treatment: Gait belt Activity Tolerance: Patient tolerated treatment well Patient left: in bed;with call bell/phone within reach           Time: 1520-1549 PT Time Calculation (min) (ACUTE ONLY): 29 min   Charges:   PT Evaluation $Initial PT Evaluation Tier I: 1 Procedure PT Treatments $Gait Training: 8-22 mins   PT G Codes:        Nikita Humble,CYNDI 03/07/2015, 5:12 PM Sheran Lawlessyndi Alejandra Barna, PT [phone] 03/07/2015

## 2015-03-07 NOTE — Progress Notes (Signed)
Utilization review completed. Cerita Rabelo, RN, BSN. 

## 2015-03-07 NOTE — Progress Notes (Signed)
Echocardiogram 2D Echocardiogram has been performed.  Nolon RodBrown, Tony 03/07/2015, 4:40 PM

## 2015-03-08 DIAGNOSIS — R251 Tremor, unspecified: Secondary | ICD-10-CM

## 2015-03-08 LAB — BASIC METABOLIC PANEL
Anion gap: 6 (ref 5–15)
BUN: 5 mg/dL — ABNORMAL LOW (ref 6–23)
CHLORIDE: 103 mmol/L (ref 96–112)
CO2: 30 mmol/L (ref 19–32)
Calcium: 8.5 mg/dL (ref 8.4–10.5)
Creatinine, Ser: 0.7 mg/dL (ref 0.50–1.10)
GFR calc Af Amer: >90 mL/min (ref >90)
GFR calc non Af Amer: 88 mL/min — ABNORMAL LOW (ref >90)
Glucose, Bld: 115 mg/dL — ABNORMAL HIGH (ref 70–99)
POTASSIUM: 3.3 mmol/L — AB (ref 3.5–5.1)
SODIUM: 139 mmol/L (ref 135–145)

## 2015-03-08 LAB — GLUCOSE, CAPILLARY: GLUCOSE-CAPILLARY: 86 mg/dL (ref 70–99)

## 2015-03-08 LAB — TROPONIN I: Troponin I: <0.03 ng/mL (ref <0.031)

## 2015-03-08 MED ORDER — CEPHALEXIN 500 MG PO CAPS: 500.0000 mg | ORAL_CAPSULE | Freq: Two times a day (BID) | ORAL | Status: DC

## 2015-03-08 MED ORDER — POTASSIUM CHLORIDE CRYS ER 20 MEQ PO TBCR: 40.0000 meq | EXTENDED_RELEASE_TABLET | Freq: Two times a day (BID) | ORAL | Status: DC

## 2015-03-08 MED ORDER — AZITHROMYCIN 250 MG PO TABS: 250.0000 mg | ORAL_TABLET | Freq: Every day | ORAL | Status: DC

## 2015-03-08 MED ORDER — POTASSIUM CHLORIDE CRYS ER 20 MEQ PO TBCR
40.0000 meq | EXTENDED_RELEASE_TABLET | Freq: Two times a day (BID) | ORAL | Status: DC
  Administered 2015-03-08: 40 meq via ORAL
  Filled 2015-03-08: qty 2

## 2015-03-08 NOTE — Discharge Summary (Signed)
Physician Discharge Summary  [name] ZOX:[id number] DOB: 07/16/1946 DOA: 03/06/2015  PCP: Molly Freud, DO  Admit date: 03/06/2015 Discharge date: 03/08/2015  Time spent: 35 minutes  Recommendations for Outpatient Follow-up:  Please follow up urine and blood culture results.   Discharge Diagnoses:    Fever   UTI   Acute encephalopathy   Morbid obesity   TREMOR, ESSENTIAL   ADD (attention deficit disorder)   Mild diastolic dysfunction   Back pain   Osteoporosis   Obesity (BMI 30-39.9)   Acute encephalopathy   Sepsis   Hypokalemia   Discharge Condition: Stable.   Diet recommendation: Heart Healthy  Filed Weights   03/06/15 2300 03/07/15 0449 03/08/15 0302  Weight: 93.8 kg (206 lb 12.7 oz) 98.3 kg (216 lb 11.4 oz) 99.9 kg (220 lb 3.8 oz)    History of present illness:  [name] is a 69 y.o. female with past medical history of hypertension, GERD, depression, anxiety, history of essential tremor, congestive heart failure, migraine headache, anemia, who presents with altered mental status, fever and sore throat  Patient initially presented to [facility] emergency room with altered mental status, but her mental status improved significantly. At arrival to the floor, her mental status came back to baseline. She is oriented 3 when I saw patient in the floor. Patient is accompanied by her husband. Per her husband, she took 3 Flexeril, Vivance, Oxycodone, and Alka Seltzer at one time for spine and knee problems last night. Patient became confused and tremulous. She was found to have fever and suspected to have serotonin syndrome by ED, therefore she was transferred to stepdown unit.   When I saw patient on the floor, she is sneezing. Detailed questioning revealed that she has been having runny nose, sore throat and mild cough since this AM. No chest pain or shortness breath. She has a sick contact with Grandgrandson who has cold recently. She has productive cough  with greenish colored sputum. She told me that has always been taking her pain medications all together in the night for long time, since she dose not take it during the day. She did not have problem in the past.   ROS: She has fever and chills. She has leg edema. Currently patient denies ear pain, headaches, chest pain, SOB, abdominal pain, diarrhea, constipation, dysuria, urgency, frequency, hematuria, skin rashes. No unilateral weakness, numbness or tingling sensations. No vision change or hearing loss.  In ED, patient was found to have CT angiogram negative for PE or PNA. WBC 10.1, temperature 99.5, heart rate 104, blood pressure 139/85, potassium 2.7, renal function okay. Patient is admitted to inpatient for further evaluation and treatment.  Hospital Course:  1-Acute encephalopathy; presents with fever, tremors, agitation.  Might be related to medications, fever.  Resolved.  Will resume chronic pain medications today. Effexor.   2-Fever, sepsis;  Presents with fever, tachycardia.  Continue with IV antibiotics. Ceftriaxone to over for UTI. Zithromax to cover for PNA.  Chest x ray negative for PNA.  Influenza panel negative. Will stop tamiflu.  Blood culture no growth to date.   3-Mild elevated troponin; related to tachycardia, stress. Will continue to cycle enzymes. No chest pain. ECHO negative.   4-hypokalemia; received 2 runs KCL iv and one dose oral. Will give kcl tablets.   5-Chronic pain: -Continue oxymorphone, mobic  6-HTN; BP increasing. Will resume Norvasc.   Procedures:  ECHO; Left ventricle: The cavity size was normal. Wall thickness was increased in a  pattern of mild LVH. Systolic function was normal. The estimated ejection fraction was in the range of 55% to 60%. Wall motion was normal; there were no regional wall motion abnormalities. Doppler parameters are consistent with abnormal left ventricular relaxation (grade 1 diastolic dysfunction).  The E/e&' ratio is <8, suggesting normal LV filling pressure. - Mitral valve: Calcified annulus. There was trivial regurgitation. - Left atrium: The atrium was normal in size. - Tricuspid valve: There was trivial regurgitation. - Pulmonary arteries: PA peak pressure: 30 mm Hg (S). - Inferior vena cava: The vessel was normal in size. The respirophasic diameter changes were in the normal range (>= 50%), consistent with normal central venous pressure.  Impressions:  - Compared to the prior echo in 12/2012, there has been no significant change.  Consultations:  none  Discharge Exam: Filed Vitals:   03/08/15 0750  BP: 146/88  Pulse: 99  Temp:   Resp: 19    General: NAD Cardiovascular: S 1, S 2 RRR Respiratory: CTA  Discharge Instructions   Discharge Instructions    Diet - low sodium heart healthy    Complete by:  As directed      Increase activity slowly    Complete by:  As directed           Current Discharge Medication List    START taking these medications   Details  azithromycin (ZITHROMAX) 250 MG tablet Take 1 tablet (250 mg total) by mouth daily. Qty: 4 each, Refills: 0    cephALEXin (KEFLEX) 500 MG capsule Take 1 capsule (500 mg total) by mouth 2 (two) times daily. Qty: 6 capsule, Refills: 0    potassium chloride SA (K-DUR,KLOR-CON) 20 MEQ tablet Take 2 tablets (40 mEq total) by mouth 2 (two) times daily with a meal. Qty: 10 tablet, Refills: 0      CONTINUE these medications which have NOT CHANGED   Details  Cholecalciferol (VITAMIN D3) 2000 UNITS TABS Take 2,000 mg by mouth daily.    cyanocobalamin 2000 MCG tablet Take 2,000 mcg by mouth daily.    cyclobenzaprine (FLEXERIL) 10 MG tablet Take 10 mg by mouth 3 (three) times daily as needed for muscle spasms.    ferrous sulfate (IRON SUPPLEMENT) 325 (65 FE) MG tablet Take 325 mg by mouth daily with breakfast.     OPANA ER, CRUSH RESISTANT, 15 MG T12A Take 15 mg by mouth daily as needed (PAIN).      OxyCODONE (OXYCONTIN) 20 mg T12A 12 hr tablet Take 20 mg by mouth 2 (two) times daily as needed (pain).    amLODipine (NORVASC) 5 MG tablet Take 1 tablet (5 mg total) by mouth daily. Qty: 180 tablet, Refills: 3   Associated Diagnoses: Morbid obesity; Essential hypertension; Acute bronchitis; Community acquired pneumonia; CAP (community acquired pneumonia); Other chest pain; Pulmonary nodules    aspirin 81 MG tablet Take 81 mg by mouth daily.    clonazePAM (KLONOPIN) 0.5 MG tablet TAKE ONE (1) TABLET BY MOUTH THREE (3) TIMES EACH DAY AS NEEDED FOR ANXIETY Qty: 90 tablet, Refills: 0    furosemide (LASIX) 40 MG tablet Take 1 tablet (40 mg total) by mouth daily. Qty: 90 tablet, Refills: 3   Associated Diagnoses: DVT (deep venous thrombosis), bilateral    lisdexamfetamine (VYVANSE) 30 MG capsule Take 1 capsule (30 mg total) by mouth daily. Qty: 30 capsule, Refills: 0   Associated Diagnoses: ADD (attention deficit disorder)    SUMAtriptan (IMITREX) 50 MG tablet TAKE 1 TABLET AT ONSET OF HEADACHE.  MAY REPEAT IN 2 HOURS IF NEEDED Qty: 10 tablet, Refills: 2    Teriparatide, Recombinant, (FORTEO) 600 MCG/2.4ML SOLN Inject 0.08 mLs (20 mcg total) into the skin daily. Qty: 2.4 mL, Refills: 11   Associated Diagnoses: Osteoporosis, unspecified    venlafaxine XR (EFFEXOR-XR) 75 MG 24 hr capsule TAKE 3 CAPSULES BY MOUTH EVERY DAY Qty: 90 capsule, Refills: 5      STOP taking these medications     meloxicam (MOBIC) 15 MG tablet      oxyCODONE (ROXICODONE) 15 MG immediate release tablet        No Known Allergies Follow-up Information    Follow up with Molly Freud, DO In 1 week.   Specialty:  Family Medicine   Contact information:   [address] [place] [address] [phone]        The results of significant diagnostics from this hospitalization (including imaging, microbiology, ancillary and laboratory) are listed below for reference.    Significant  Diagnostic Studies: Dg Chest 2 View  03/07/2015   CLINICAL DATA:  Fever and shortness of breath.  Hoarseness  EXAM: CHEST  2 VIEW  COMPARISON:  03/05/2014  FINDINGS: Chronic elevation of the right diaphragm with lateral morphology suggesting eventration. Normal heart size and stable aortic tortuosity. There is no edema, consolidation, effusion, or pneumothorax. Remote T8 and T12 compression fractures.  IMPRESSION: 1. No active cardiopulmonary disease. 2. Eventration of the right diaphragm with mild overlying atelectasis. 3. Remote thoracic compression fractures.   Electronically Signed   By: Marnee Spring M.D.   On: 03/07/2015 10:44    Microbiology: Recent Results (from the past 240 hour(s))  MRSA PCR Screening     Status: None   Collection Time: 03/07/15 12:08 AM  Result Value Ref Range Status   MRSA by PCR NEGATIVE NEGATIVE Final    Comment:        The GeneXpert MRSA Assay (FDA approved for NASAL specimens only), is one component of a comprehensive MRSA colonization surveillance program. It is not intended to diagnose MRSA infection nor to guide or monitor treatment for MRSA infections.   Culture, blood (routine x 2)     Status: None (Preliminary result)   Collection Time: 03/07/15 12:57 AM  Result Value Ref Range Status   Specimen Description BLOOD LEFT ANTECUBITAL  Final   Special Requests BOTTLES DRAWN AEROBIC ONLY 10CC  Final   Culture   Final           BLOOD CULTURE RECEIVED NO GROWTH TO DATE CULTURE WILL BE HELD FOR 5 DAYS BEFORE ISSUING A FINAL NEGATIVE REPORT Performed at [facility]    Report Status PENDING  Incomplete  Culture, blood (routine x 2)     Status: None (Preliminary result)   Collection Time: 03/07/15  1:04 AM  Result Value Ref Range Status   Specimen Description BLOOD RIGHT HAND  Final   Special Requests BOTTLES DRAWN AEROBIC ONLY 2CC  Final   Culture   Final           BLOOD CULTURE RECEIVED NO GROWTH TO DATE CULTURE WILL BE HELD FOR 5 DAYS  BEFORE ISSUING A FINAL NEGATIVE REPORT Performed at [facility]    Report Status PENDING  Incomplete     Labs: Basic Metabolic Panel:  Recent Labs Lab 03/06/15 1920 03/07/15 0230 03/08/15 0058  NA 138 140 139  K 2.7* 2.9* 3.3*  CL 99 104 103  CO2 GLUCOSE  106* 112* 115*  BUN 12 6 5*  CREATININE 0.85 0.81 0.70  CALCIUM 8.5 8.4 8.5   Liver Function Tests:  Recent Labs Lab 03/07/15 0230  AST 21  ALT 9  ALKPHOS 90  BILITOT 0.9  PROT 6.3  ALBUMIN 3.2*   No results for input(s): LIPASE, AMYLASE in the last 168 hours. No results for input(s): AMMONIA in the last 168 hours. CBC:  Recent Labs Lab 03/06/15 1920 03/07/15 0230  WBC 10.1 10.0  NEUTROABS 7.2  --   HGB 13.0 12.6  HCT 38.3 37.1  MCV 90.3 89.2  PLT 241 194   Cardiac Enzymes:  Recent Labs Lab 03/07/15 0057 03/07/15 0230 03/07/15 1110 03/07/15 1348 03/07/15 1831 03/08/15 0058  CKTOTAL 98  --   --   --   --   --   TROPONINI <0.03 0.05* 0.04* 0.03 <0.03 <0.03   BNP: BNP (last 3 results)  Recent Labs  03/07/15 0230  BNP 215.8*    ProBNP (last 3 results) No results for input(s): PROBNP in the last 8760 hours.  CBG:  Recent Labs Lab 03/07/15 0712 03/08/15 0750  GLUCAP 82 86       Signed:  Sharran Caratachea A  [facility] 03/08/2015, 10:45 AM

## 2015-03-08 NOTE — Progress Notes (Signed)
Discharge instructions given. No questions or concerns at this time. IV d/c'd. Tip intact. Pt tolerated well.  

## 2015-03-09 LAB — URINE CULTURE: Colony Count: >=100000

## 2015-03-10 LAB — LEGIONELLA ANTIGEN, URINE

## 2015-03-11 ENCOUNTER — Telehealth: Payer: Self-pay | Admitting: *Deleted

## 2015-03-11 NOTE — Telephone Encounter (Signed)
Transition Care Management Follow-up Telephone Call  How have you been since you were released from the hospital? Feeling better, just getting tired    Do you understand why you were in the hospital? YES    Do you understand the discharge instrcutions? YES, no concerns   Items Reviewed:  Medications reviewed: YES   Allergies reviewed:  YES   Dietary changes reviewed:  YES   Referrals reviewed:  YES    Functional Questionnaire:   Activities of Daily Living (ADLs):   She states they are independent in the following: all- granddaughter is staying with her  States they require assistance with the following: none    Any transportation issues/concerns?: patient is driving herself    Any patient concerns? NO    Confirmed importance and date/time of follow-up visits scheduled: YES- patient scheduled with Dr. Laury AxonLowne 03/20/15 at 11:30.     Confirmed with patient if condition begins to worsen call PCP or go to the ER.  Patient was given the Call-a-Nurse line [phone]: YES

## 2015-03-13 ENCOUNTER — Other Ambulatory Visit: Payer: Self-pay | Admitting: Family Medicine

## 2015-03-13 DIAGNOSIS — F411 Generalized anxiety disorder: Secondary | ICD-10-CM

## 2015-03-13 LAB — CULTURE, BLOOD (ROUTINE X 2)
CULTURE: NO GROWTH
Culture: NO GROWTH

## 2015-03-13 NOTE — Telephone Encounter (Signed)
Last seen 12/05/14 and filled 02/13/15 #90 UDS 04/22/14 low risk   Please advise     KP

## 2015-03-19 ENCOUNTER — Ambulatory Visit: Payer: Medicare PPO | Admitting: Cardiology

## 2015-03-20 ENCOUNTER — Ambulatory Visit: Payer: Medicare PPO | Admitting: Family Medicine

## 2015-03-20 ENCOUNTER — Encounter: Payer: Self-pay | Admitting: Cardiology

## 2015-03-20 ENCOUNTER — Telehealth: Payer: Self-pay | Admitting: Family Medicine

## 2015-03-20 NOTE — Telephone Encounter (Signed)
Caller name: Drexel IhaJohn Bundrick  Relation to pt: spouse  Call back number: [phone]    Reason for call:   [place], pt is vomiting and has diarrhea [facility] MD does not have any hospital follow up appointments until May 27 th. Pt does not want to come in tomorrow. Please advise

## 2015-03-20 NOTE — Telephone Encounter (Signed)
Rescheduled due to patient request

## 2015-03-24 ENCOUNTER — Encounter: Payer: Self-pay | Admitting: Family Medicine

## 2015-03-24 ENCOUNTER — Ambulatory Visit (INDEPENDENT_AMBULATORY_CARE_PROVIDER_SITE_OTHER): Payer: Medicare PPO | Admitting: Family Medicine

## 2015-03-24 VITALS — BP 132/80 | HR 86 | Temp 97.6°F | Wt 213.0 lb

## 2015-03-24 DIAGNOSIS — Z87440 Personal history of urinary (tract) infections: Secondary | ICD-10-CM

## 2015-03-24 DIAGNOSIS — F411 Generalized anxiety disorder: Secondary | ICD-10-CM

## 2015-03-24 DIAGNOSIS — R82998 Other abnormal findings in urine: Secondary | ICD-10-CM

## 2015-03-24 DIAGNOSIS — F341 Dysthymic disorder: Secondary | ICD-10-CM | POA: Diagnosis not present

## 2015-03-24 DIAGNOSIS — N390 Urinary tract infection, site not specified: Secondary | ICD-10-CM

## 2015-03-24 LAB — POCT URINALYSIS DIPSTICK
BILIRUBIN UA: NEGATIVE
Blood, UA: NEGATIVE
Glucose, UA: NEGATIVE
Nitrite, UA: NEGATIVE
PH UA: 6
Spec Grav, UA: >=1.03
Urobilinogen, UA: 2

## 2015-03-24 MED ORDER — VENLAFAXINE HCL ER 75 MG PO CP24: ORAL_CAPSULE | ORAL | Status: DC

## 2015-03-24 NOTE — Progress Notes (Signed)
Pre visit review using our clinic review tool, if applicable. No additional management support is needed unless otherwise documented below in the visit note. 

## 2015-03-24 NOTE — Patient Instructions (Addendum)
Serotonin Syndrome Serotonin is a brain chemical that regulates the nervous system. Some kinds of drugs increase the amount of serotonin in your body. Drugs that increase the serotonin in your body include:   Anti-depressant medications.  St. [name]'s wort.  Recreational drugs.  Migraine medicines.  Some pain medicines. SYMPTOMS Combining these drugs increases the risk that you will become ill with a toxic condition called serotonin syndrome.  Symptoms of too much serotonin include:  Confusion.  Agitation.  Weakness.  Insomnia.  Fever.  Sweats. Other symptoms that may develop include:  Shakiness.  Muscle spasms.  Seizures. TREATMENT  Hospital treatment is often needed until the effects are controlled.  Avoiding the combination of medicines listed above is recommended.  Check with your doctor if you are concerned about your medicine or the side effects. Document Released: 12/02/2004 Document Revised: 01/17/2012 Document Reviewed: 10/25/2005 [facility] Patient Information 2015 [place], [place]. This information is not intended to replace advice given to you by your health care provider. Make sure you discuss any questions you have with your health care provider. +9+-+

## 2015-03-24 NOTE — Progress Notes (Signed)
Patient ID: [name], female    DOB: 06/25/1946  Age: 69 y.o. MRN: [id number]    Subjective:  Subjective HPI [name] presents for f/u from hospital but it is outside 14 day period.  Pt is doing much better.  She was dx with serotonin syndrome.  ED viist and d/c summary reviewed.    Review of Systems  Constitutional: Negative for diaphoresis, appetite change, fatigue and unexpected weight change.  Eyes: Negative for pain, redness and visual disturbance.  Respiratory: Negative for cough, chest tightness, shortness of breath and wheezing.   Cardiovascular: Negative for chest pain, palpitations and leg swelling.  Endocrine: Negative for cold intolerance, heat intolerance, polydipsia, polyphagia and polyuria.  Genitourinary: Negative for dysuria, frequency and difficulty urinating.  Neurological: Negative for dizziness, light-headedness, numbness and headaches.  Psychiatric/Behavioral: Negative for dysphoric mood and decreased concentration. The patient is not nervous/anxious.     History Past Medical History  Diagnosis Date  . Anxiety   . HYPERLIPIDEMIA 05/15/2007    Qualifier: Diagnosis of  By: Janit Bern ; pt denies this hx on 10/02/2013  . ADD (attention deficit disorder) 05/24/2012  . DIZZINESS 12/31/2009    Qualifier: Diagnosis of  By: Oswald Hillock    . GERD 12/03/2008    Qualifier: Diagnosis of  By: Freddy Jaksch    . Idiopathic scoliosis 04/19/2013  . Osteoporosis, unspecified 04/19/2013  . Pica 12/28/2007    Qualifier: Diagnosis of  By: Janit Bern    . PALPITATIONS, RECURRENT 12/12/2009    Qualifier: Diagnosis of  By: Janit Bern    . PSTPRC STATUS, BARIATRIC SURGERY 05/15/2007    Qualifier: Diagnosis of  By: Janit Bern    . TREMOR, ESSENTIAL 12/12/2009    Qualifier: Diagnosis of  By: Janit Bern    . Unspecified Myalgia and Myositis 05/15/2007    Centricity Description: FIBROMYALGIA Qualifier: Diagnosis of  By: Janit Bern   Centricity  Description: MYALGIA Qualifier: Diagnosis of  By: Janit Bern    . Chronic back pain     "all over my back" (10/02/2013)  . Hypertension   . Crushing injury of arm, right 02/06/1989's    "it was crushed; wore cast from fingers to top of my shoulder" (11/25/2  . Crushing injury of left wrist 05/11/1989's  . Congestive heart disease     "I have the beginning signs" (10/02/2013)  . Pneumonia 10/02/2013    "had onsets of it before; not full blown til now" (10/02/2013)  . ANEMIA, MILD 06/26/2008    Qualifier: Diagnosis of  By: Janit Bern    . Other vitamin B12 deficiency anemia 10/30/2009    Qualifier: Diagnosis of  By: Floydene Flock    . Migraines     "once/month" (10/02/2013)  . Arthritis     "joints, back" (10/02/2013)  . Fibromyalgia   . Pulmonary nodules/lesions, multiple     She has past surgical history that includes Gastric bypass (~ 2003); Abdominal hysterectomy (1980's); Knee arthroscopy (Right, 2002); Tonsillectomy and adenoidectomy (1955); Myomectomy (1980's); Appendectomy (1980's); Tubal ligation (1980's); Reduction mammaplasty (Bilateral, 2001); Refractive surgery (Bilateral, 2004); and Closed reduction hand fracture (Right, 1990's).   Her family history includes COPD in her mother; Emphysema in her mother; Heart attack in her mother; Other in her mother. She was adopted.She reports that she has never smoked. She has never used smokeless tobacco. She reports that she drinks alcohol. She reports that she does not use illicit drugs.  Current Outpatient Prescriptions on File Prior to Visit  Medication Sig Dispense Refill  . amLODipine (NORVASC) 5 MG tablet Take 1 tablet (5 mg total) by mouth daily. 180 tablet 3  . aspirin 81 MG tablet Take 81 mg by mouth daily.    . Cholecalciferol (VITAMIN D3) 2000 UNITS TABS Take 2,000 mg by mouth daily.    . clonazePAM (KLONOPIN) 0.5 MG tablet TAKE 1 TABLET 3 TIMES A DAY AS NEEDED FOR ANXIETY 90 tablet 1  . cyanocobalamin 2000 MCG  tablet Take 2,000 mcg by mouth daily.    . cyclobenzaprine (FLEXERIL) 10 MG tablet Take 10 mg by mouth 3 (three) times daily as needed for muscle spasms.    . ferrous sulfate (IRON SUPPLEMENT) 325 (65 FE) MG tablet Take 325 mg by mouth daily with breakfast.     . furosemide (LASIX) 40 MG tablet Take 1 tablet (40 mg total) by mouth daily. 90 tablet 3  . lisdexamfetamine (VYVANSE) 30 MG capsule Take 1 capsule (30 mg total) by mouth daily. 30 capsule 0  . SUMAtriptan (IMITREX) 50 MG tablet TAKE 1 TABLET AT ONSET OF HEADACHE. MAY REPEAT IN 2 HOURS IF NEEDED 10 tablet 2  . Teriparatide, Recombinant, (FORTEO) 600 MCG/2.4ML SOLN Inject 0.08 mLs (20 mcg total) into the skin daily. 2.4 mL 11   No current facility-administered medications on file prior to visit.     Objective:  Objective Physical Exam  Constitutional: She is oriented to person, place, and time. She appears well-developed and well-nourished.  HENT:  Head: Normocephalic and atraumatic.  Eyes: Conjunctivae and EOM are normal.  Neck: Normal range of motion. Neck supple. No JVD present. Carotid bruit is not present. No thyromegaly present.  Cardiovascular: Normal rate, regular rhythm and normal heart sounds.   No murmur heard. Pulmonary/Chest: Effort normal and breath sounds normal. No respiratory distress. She has no wheezes. She has no rales. She exhibits no tenderness.  Musculoskeletal: She exhibits no edema.  Neurological: She is alert and oriented to person, place, and time.  Psychiatric: She has a normal mood and affect. Her behavior is normal. Thought content normal.   BP 132/80 mmHg  Pulse 86  Temp(Src) 97.6 F (36.4 C)  Wt 213 lb (96.616 kg)  SpO2 95% Wt Readings from Last 3 Encounters:  03/24/15 213 lb (96.616 kg)  03/08/15 220 lb 3.8 oz (99.9 kg)  01/24/15 217 lb (98.431 kg)     Lab Results  Component Value Date   WBC 10.0 03/07/2015   HGB 12.6 03/07/2015   HCT 37.1 03/07/2015   PLT 194 03/07/2015   GLUCOSE  115* 03/08/2015   CHOL 182 10/28/2009   TRIG 155.0* 10/28/2009   HDL 61.30 10/28/2009   LDLDIRECT 129.1 07/17/2007   LDLCALC 90 10/28/2009   ALT 9 03/07/2015   AST 21 03/07/2015   NA 139 03/08/2015   K 3.3* 03/08/2015   CL 103 03/08/2015   CREATININE 0.70 03/08/2015   BUN 5* 03/08/2015   CO2 30 03/08/2015   TSH 1.26 04/22/2014   INR 1.14 03/07/2015   HGBA1C 5.3 04/22/2014    Dg Chest 2 View  03/07/2015   CLINICAL DATA:  Fever and shortness of breath.  Hoarseness  EXAM: CHEST  2 VIEW  COMPARISON:  03/05/2014  FINDINGS: Chronic elevation of the right diaphragm with lateral morphology suggesting eventration. Normal heart size and stable aortic tortuosity. There is no edema, consolidation, effusion, or pneumothorax. Remote T8 and T12 compression fractures.  IMPRESSION: 1. No active  cardiopulmonary disease. 2. Eventration of the right diaphragm with mild overlying atelectasis. 3. Remote thoracic compression fractures.   Electronically Signed   By: Marnee SpringJonathon  Watts M.D.   On: 03/07/2015 10:44     Assessment & Plan:  Plan I have discontinued Ms. Lavone Neriarker's OPANA ER (CRUSH RESISTANT), OxyCODONE, azithromycin, potassium chloride SA, and cephALEXin. I have also changed her venlafaxine XR. Additionally, I am having her maintain her ferrous sulfate, Vitamin D3, cyanocobalamin, aspirin, amLODipine, Teriparatide (Recombinant), cyclobenzaprine, furosemide, lisdexamfetamine, SUMAtriptan, clonazePAM, Oxycodone HCl, and oxymorphone.  Meds ordered this encounter  Medications  . Oxycodone HCl 20 MG TABS    Sig: Take 1 tablet by mouth 4 (four) times daily as needed.  [name]. oxymorphone (OPANA ER) 15 MG 12 hr tablet    Sig: Take 1 tablet by mouth 2 (two) times daily.  [name]. venlafaxine XR (EFFEXOR-XR) 75 MG 24 hr capsule    Sig: 2 po qd    Dispense:  60 capsule    Refill:  3    D/C PREVIOUS SCRIPTS FOR THIS MEDICATION    Problem List Items Addressed This Visit    None    Visit Diagnoses    Generalized  anxiety disorder    -  Primary    Relevant Medications    venlafaxine XR (EFFEXOR-XR) 75 MG 24 hr capsule    Hx: UTI (urinary tract infection)        Relevant Orders    POCT urinalysis dipstick       Follow-up: Return in about 3 months (around 06/24/2015), or if symptoms worsen or fail to improve, for f/u.  Loreen FreudYvonne Lowne, DO

## 2015-03-24 NOTE — Assessment & Plan Note (Signed)
Decrease effexor 2 tab a day rto 3-6 months

## 2015-03-27 LAB — URINE CULTURE

## 2015-04-01 ENCOUNTER — Telehealth: Payer: Self-pay | Admitting: Family Medicine

## 2015-04-01 DIAGNOSIS — F988 Other specified behavioral and emotional disorders with onset usually occurring in childhood and adolescence: Secondary | ICD-10-CM

## 2015-04-01 NOTE — Telephone Encounter (Signed)
OK to refill Vyvanse, same strength, same sig, #30

## 2015-04-01 NOTE — Telephone Encounter (Signed)
[facility] patient  Last seen 03/24/15 and filled 02/24/15 #30 no rf UDS 05/08/14 low risk   Please advise    KP

## 2015-04-01 NOTE — Telephone Encounter (Signed)
Caller name: [place] Sinkrita Relation to pt: self Call back number: [phone] Pharmacy:  Reason for call:   Requesting vyvanse rx

## 2015-04-02 MED ORDER — LISDEXAMFETAMINE DIMESYLATE 30 MG PO CAPS: 30.0000 mg | ORAL_CAPSULE | Freq: Every day | ORAL | Status: DC

## 2015-04-02 NOTE — Telephone Encounter (Signed)
Rx signed by Dr.Lowne. Patient aware it is ready for pick up.     KP

## 2015-04-03 ENCOUNTER — Telehealth: Payer: Self-pay

## 2015-04-04 ENCOUNTER — Other Ambulatory Visit: Payer: Self-pay

## 2015-04-04 DIAGNOSIS — R0789 Other chest pain: Secondary | ICD-10-CM

## 2015-04-04 DIAGNOSIS — R918 Other nonspecific abnormal finding of lung field: Secondary | ICD-10-CM

## 2015-04-04 DIAGNOSIS — I10 Essential (primary) hypertension: Secondary | ICD-10-CM

## 2015-04-04 DIAGNOSIS — J189 Pneumonia, unspecified organism: Secondary | ICD-10-CM

## 2015-04-04 MED ORDER — AMLODIPINE BESYLATE 5 MG PO TABS: 5.0000 mg | ORAL_TABLET | Freq: Every day | ORAL | Status: DC

## 2015-04-04 NOTE — Telephone Encounter (Signed)
yes

## 2015-04-12 ENCOUNTER — Other Ambulatory Visit: Payer: Self-pay | Admitting: Family Medicine

## 2015-04-14 NOTE — Telephone Encounter (Signed)
Rx was sent on 03/13/15 #90 with 1 refill.     KP

## 2015-04-15 ENCOUNTER — Ambulatory Visit: Payer: Medicare PPO | Admitting: Family Medicine

## 2015-04-15 DIAGNOSIS — F4542 Pain disorder with related psychological factors: Secondary | ICD-10-CM | POA: Diagnosis not present

## 2015-04-22 ENCOUNTER — Telehealth: Payer: Self-pay | Admitting: Family Medicine

## 2015-04-22 DIAGNOSIS — F411 Generalized anxiety disorder: Secondary | ICD-10-CM

## 2015-04-22 NOTE — Telephone Encounter (Signed)
Relation to pt: self  Call back number: [phone] Pharmacy: [facility]  [address] [place], [place], [place] [address] [phone]   Reason for call:   Pt requesting a refill venlafaxine XR (EFFEXOR-XR) 75 MG 24 hr capsule. Pt states she is completely out. Advised pt in the future please call at least a week in advance when running low. Pt states MD was going to change 3 tablets to 2 tablets a day (weaning pt off meds) as per discussion at last office visit 03/24/2015. Pt is out of town requesting script to be sent to [facility] in [place] [phone]. Please advise

## 2015-04-23 MED ORDER — VENLAFAXINE HCL ER 75 MG PO CP24: ORAL_CAPSULE | ORAL | Status: DC

## 2015-04-23 NOTE — Telephone Encounter (Signed)
Spoke with pt. She is not wanting to wean off medication completely. Has been taking 2 a day and feels fine at current dose. States she has been out of medication since Sunday. Rx sent. Advised her to just take 1 tablet tonight and then increase to 2 the next night per verbal from PA, [place].

## 2015-04-23 NOTE — Telephone Encounter (Signed)
Does pt feel like she can decrease to 1 or does she want to stay on 2? If she wants to dec--- ok to refill #30 -- 1 po qd If she wants to stay on 2 --- #60  2 refills either way

## 2015-04-23 NOTE — Telephone Encounter (Signed)
Rx was decreased to 2 tablets daily last visit. Is pt supposed to be stopping them? Please advise.

## 2015-04-28 DIAGNOSIS — G894 Chronic pain syndrome: Secondary | ICD-10-CM | POA: Diagnosis not present

## 2015-04-28 DIAGNOSIS — M545 Low back pain: Secondary | ICD-10-CM | POA: Diagnosis not present

## 2015-04-28 DIAGNOSIS — M4120 Other idiopathic scoliosis, site unspecified: Secondary | ICD-10-CM | POA: Diagnosis not present

## 2015-04-28 DIAGNOSIS — M5126 Other intervertebral disc displacement, lumbar region: Secondary | ICD-10-CM | POA: Diagnosis not present

## 2015-04-28 DIAGNOSIS — Z79899 Other long term (current) drug therapy: Secondary | ICD-10-CM | POA: Diagnosis not present

## 2015-04-28 DIAGNOSIS — Z79891 Long term (current) use of opiate analgesic: Secondary | ICD-10-CM | POA: Diagnosis not present

## 2015-05-06 ENCOUNTER — Telehealth: Payer: Self-pay | Admitting: Family Medicine

## 2015-05-06 DIAGNOSIS — F988 Other specified behavioral and emotional disorders with onset usually occurring in childhood and adolescence: Secondary | ICD-10-CM

## 2015-05-06 MED ORDER — LISDEXAMFETAMINE DIMESYLATE 30 MG PO CAPS: 30.0000 mg | ORAL_CAPSULE | Freq: Every day | ORAL | Status: DC

## 2015-05-06 NOTE — Telephone Encounter (Signed)
Caller name: [place] Sinkrita Relation to pt: self Call back number: [phone] Pharmacy:  Reason for call:   Requesting vyvanse refill

## 2015-05-06 NOTE — Telephone Encounter (Signed)
UDS current as well as OV, Rx printed and sign. VM left advising the patient it is ready for pick up.     KP

## 2015-05-10 ENCOUNTER — Other Ambulatory Visit: Payer: Self-pay | Admitting: Family Medicine

## 2015-05-13 NOTE — Telephone Encounter (Signed)
Pt is requesting refill on Clonazepam. Dr. Laury AxonLowne Pt.   Last OV: 03/24/2015 Last Fill: 03/13/2015 #90 1RF UDS: 07/06/2013 High Risk  Please advise.

## 2015-05-13 NOTE — Telephone Encounter (Signed)
#  90, no refills 

## 2015-05-13 NOTE — Telephone Encounter (Signed)
Rx faxed to [facility].  

## 2015-06-02 DIAGNOSIS — G894 Chronic pain syndrome: Secondary | ICD-10-CM | POA: Diagnosis not present

## 2015-06-05 ENCOUNTER — Telehealth: Payer: Self-pay | Admitting: Family Medicine

## 2015-06-05 DIAGNOSIS — F988 Other specified behavioral and emotional disorders with onset usually occurring in childhood and adolescence: Secondary | ICD-10-CM

## 2015-06-05 MED ORDER — LISDEXAMFETAMINE DIMESYLATE 30 MG PO CAPS: 30.0000 mg | ORAL_CAPSULE | Freq: Every day | ORAL | Status: DC

## 2015-06-05 NOTE — Telephone Encounter (Signed)
Relation to pt: self Call back number: [phone]   Reason for call:  Patient requesting a refill lisdexamfetamine (VYVANSE) 30 MG capsule  Patient would like to fill RX 06/06/15 due to patient going out of town. Please advise

## 2015-06-05 NOTE — Telephone Encounter (Signed)
Patient aware Rx ready for pick up.      KP 

## 2015-06-18 ENCOUNTER — Other Ambulatory Visit: Payer: Self-pay | Admitting: Internal Medicine

## 2015-06-18 NOTE — Telephone Encounter (Signed)
Last seen 03/24/15 and filled 05/13/15 #90   Please advise    KP

## 2015-06-18 NOTE — Telephone Encounter (Signed)
Relation to pt: self  Call back number:[phone] Pharmacy: [facility] [place], [place] - [address] [phone] (Phone) [phone] (Fax)        Reason for call:  Patient requesting a refill clonazePAM (KLONOPIN) 0.5 MG tablet

## 2015-06-19 ENCOUNTER — Telehealth: Payer: Self-pay | Admitting: Family Medicine

## 2015-06-19 MED ORDER — CLONAZEPAM 0.5 MG PO TABS: ORAL_TABLET | ORAL | Status: DC

## 2015-06-19 NOTE — Telephone Encounter (Signed)
Rx faxed.    KP 

## 2015-06-19 NOTE — Telephone Encounter (Signed)
It is already printed and she can come by and pick it up if she wants.     KP

## 2015-06-19 NOTE — Telephone Encounter (Signed)
Relation to pt: self  Call back number: [phone] Pharmacy: [facility] [place], [place] - [address] [phone] (Phone) [phone] (Fax)        Reason for call:  Patient spoke with pharmacy and states they have not received clonazePAM (KLONOPIN) 0.5 MG tablet.

## 2015-06-19 NOTE — Telephone Encounter (Signed)
Patient called back and stated she is going out of town and would like a hard copy printed and she will pick up.

## 2015-06-19 NOTE — Addendum Note (Signed)
Addended by: Arnette Norris on: 06/19/2015 08:15 AM   Modules accepted: Orders

## 2015-06-20 ENCOUNTER — Telehealth: Payer: Self-pay | Admitting: Family Medicine

## 2015-06-20 DIAGNOSIS — F411 Generalized anxiety disorder: Secondary | ICD-10-CM

## 2015-06-20 MED ORDER — VENLAFAXINE HCL ER 75 MG PO CP24: ORAL_CAPSULE | ORAL | Status: DC

## 2015-06-20 NOTE — Telephone Encounter (Signed)
Caller name: Relation to ZO:XWRU Call back [phone] Pharmacy:[facility]  Reason for call: pt is needing rx venlafaxine XR (EFFEXOR-XR) 75 MG 24 hr capsule, pt would like to know if she can go back to 3 daily instead of 2.

## 2015-06-20 NOTE — Telephone Encounter (Signed)
Patient spouse informed 06/19/15

## 2015-06-20 NOTE — Telephone Encounter (Signed)
Rx faxed.    KP 

## 2015-06-20 NOTE — Telephone Encounter (Signed)
Ok to increase to 3 daily--- #90  5 refills

## 2015-06-20 NOTE — Telephone Encounter (Signed)
Please advise      KP 

## 2015-07-01 ENCOUNTER — Ambulatory Visit (INDEPENDENT_AMBULATORY_CARE_PROVIDER_SITE_OTHER): Payer: Medicare PPO | Admitting: Family Medicine

## 2015-07-01 ENCOUNTER — Encounter: Payer: Self-pay | Admitting: Family Medicine

## 2015-07-01 VITALS — BP 116/80 | HR 94 | Temp 98.6°F | Wt 193.6 lb

## 2015-07-01 DIAGNOSIS — F909 Attention-deficit hyperactivity disorder, unspecified type: Secondary | ICD-10-CM

## 2015-07-01 DIAGNOSIS — I10 Essential (primary) hypertension: Secondary | ICD-10-CM | POA: Diagnosis not present

## 2015-07-01 DIAGNOSIS — F988 Other specified behavioral and emotional disorders with onset usually occurring in childhood and adolescence: Secondary | ICD-10-CM

## 2015-07-01 DIAGNOSIS — G8929 Other chronic pain: Secondary | ICD-10-CM

## 2015-07-01 DIAGNOSIS — F341 Dysthymic disorder: Secondary | ICD-10-CM | POA: Diagnosis not present

## 2015-07-01 DIAGNOSIS — E785 Hyperlipidemia, unspecified: Secondary | ICD-10-CM

## 2015-07-01 DIAGNOSIS — M549 Dorsalgia, unspecified: Secondary | ICD-10-CM

## 2015-07-01 MED ORDER — LISDEXAMFETAMINE DIMESYLATE 30 MG PO CAPS: 30.0000 mg | ORAL_CAPSULE | Freq: Every day | ORAL | Status: DC

## 2015-07-01 NOTE — Progress Notes (Signed)
Pre visit review using our clinic review tool, if applicable. No additional management support is needed unless otherwise documented below in the visit note. 

## 2015-07-01 NOTE — Assessment & Plan Note (Signed)
con't norvasc and lasix

## 2015-07-01 NOTE — Progress Notes (Signed)
Patient ID: [name], female   DOB: 1946/02/22, 69 y.o.   MRN: [id number]    Subjective:    Patient ID: [name], female    DOB: 07/28/1946, 69 y.o.   MRN: [id number]  Chief Complaint  Patient presents with  . Follow-up    3 mo f/u    HPI Patient is in today for f/u add.  She has c/o L ear pain x few days.  She is also very excited because she is getting a spinal stimulator for pain next month.  When she had the temporary one in place it worked very well.     Past Medical History  Diagnosis Date  . Anxiety   . HYPERLIPIDEMIA 05/15/2007    Qualifier: Diagnosis of  By: Janit Bern ; pt denies this hx on 10/02/2013  . ADD (attention deficit disorder) 05/24/2012  . DIZZINESS 12/31/2009    Qualifier: Diagnosis of  By: Oswald Hillock    . GERD 12/03/2008    Qualifier: Diagnosis of  By: Freddy Jaksch    . Idiopathic scoliosis 04/19/2013  . Osteoporosis, unspecified 04/19/2013  . Pica 12/28/2007    Qualifier: Diagnosis of  By: Janit Bern    . PALPITATIONS, RECURRENT 12/12/2009    Qualifier: Diagnosis of  By: Janit Bern    . PSTPRC STATUS, BARIATRIC SURGERY 05/15/2007    Qualifier: Diagnosis of  By: Janit Bern    . TREMOR, ESSENTIAL 12/12/2009    Qualifier: Diagnosis of  By: Janit Bern    . Unspecified Myalgia and Myositis 05/15/2007    Centricity Description: FIBROMYALGIA Qualifier: Diagnosis of  By: Janit Bern   Centricity Description: MYALGIA Qualifier: Diagnosis of  By: Janit Bern    . Chronic back pain     "all over my back" (10/02/2013)  . Hypertension   . Crushing injury of arm, right 02/06/1989's    "it was crushed; wore cast from fingers to top of my shoulder" (11/25/2  . Crushing injury of left wrist 05/11/1989's  . Congestive heart disease     "I have the beginning signs" (10/02/2013)  . Pneumonia 10/02/2013    "had onsets of it before; not full blown til now" (10/02/2013)  . ANEMIA, MILD 06/26/2008    Qualifier: Diagnosis of  By:  Janit Bern    . Other vitamin B12 deficiency anemia 10/30/2009    Qualifier: Diagnosis of  By: Floydene Flock    . Migraines     "once/month" (10/02/2013)  . Arthritis     "joints, back" (10/02/2013)  . Fibromyalgia   . Pulmonary nodules/lesions, multiple     Past Surgical History  Procedure Laterality Date  . Gastric bypass  ~ 2003  . Abdominal hysterectomy  1980's  . Knee arthroscopy Right 2002  . Tonsillectomy and adenoidectomy  1955  . Myomectomy  1980's  . Appendectomy  1980's    "w/either my hysterectomy or myomectomy" (10/02/2013)  . Tubal ligation  1980's  . Reduction mammaplasty Bilateral 2001  . Refractive surgery Bilateral 2004  . Closed reduction hand fracture Right 1990's    "it was crushed; wore cast from fingers to top of my shoulder" (10/02/2013)    Family History  Problem Relation Age of Onset  . Adopted: Yes  . COPD Mother   . Emphysema Mother   . Heart attack Mother   . Other Mother     tobacco abuse    Social History   Social History  .  Marital Status: Married    Spouse Name: N/A  . Number of Children: 2  . Years of Education: N/A   Occupational History  . Retired    Social History Main Topics  . Smoking status: Never Smoker   . Smokeless tobacco: Never Used  . Alcohol Use: 0.0 oz/week    0 Standard drinks or equivalent per week     Comment: 10/02/2013 "glass of wine or martini maybe once/year"  . Drug Use: No  . Sexual Activity: No   Other Topics Concern  . Not on file   Social History Narrative    Outpatient Prescriptions Prior to Visit  Medication Sig Dispense Refill  . amLODipine (NORVASC) 5 MG tablet Take 1 tablet (5 mg total) by mouth daily. 180 tablet 3  . aspirin 81 MG tablet Take 81 mg by mouth daily.    . clonazePAM (KLONOPIN) 0.5 MG tablet TAKE ONE TABLET 3 TIMES A DAY AS NEEDED.FOR ANXIETY 90 tablet 2  . cyclobenzaprine (FLEXERIL) 10 MG tablet Take 10 mg by mouth 3 (three) times daily as needed for muscle spasms.     . furosemide (LASIX) 40 MG tablet Take 1 tablet (40 mg total) by mouth daily. 90 tablet 3  . Oxycodone HCl 20 MG TABS Take 1 tablet by mouth 4 (four) times daily as needed.    [name] oxymorphone (OPANA ER) 15 MG 12 hr tablet Take 1 tablet by mouth 2 (two) times daily.    . SUMAtriptan (IMITREX) 50 MG tablet TAKE 1 TABLET AT ONSET OF HEADACHE. MAY REPEAT IN 2 HOURS IF NEEDED 10 tablet 2  . Teriparatide, Recombinant, (FORTEO) 600 MCG/2.4ML SOLN Inject 0.08 mLs (20 mcg total) into the skin daily. 2.4 mL 11  . venlafaxine XR (EFFEXOR-XR) 75 MG 24 hr capsule Take 3 tablets by mouth daily. 90 capsule 5  . lisdexamfetamine (VYVANSE) 30 MG capsule Take 1 capsule (30 mg total) by mouth daily. 30 capsule 0  . Cholecalciferol (VITAMIN D3) 2000 UNITS TABS Take 2,000 mg by mouth daily.    . cyanocobalamin 2000 MCG tablet Take 2,000 mcg by mouth daily.    . ferrous sulfate (IRON SUPPLEMENT) 325 (65 FE) MG tablet Take 325 mg by mouth daily with breakfast.      No facility-administered medications prior to visit.    No Known Allergies  Review of Systems  Constitutional: Negative for fever and malaise/fatigue.  HENT: Positive for ear pain. Negative for congestion.        Left ear  Eyes: Negative for discharge.  Respiratory: Negative for shortness of breath.   Cardiovascular: Negative for chest pain, palpitations and leg swelling.  Gastrointestinal: Negative for nausea and abdominal pain.  Genitourinary: Negative for dysuria.  Musculoskeletal: Negative for falls.  Skin: Negative for rash.  Neurological: Negative for loss of consciousness and headaches.  Endo/Heme/Allergies: Negative for environmental allergies.  Psychiatric/Behavioral: Negative for depression. The patient is not nervous/anxious.        Objective:    Physical Exam  Constitutional: She is oriented to person, place, and time. She appears well-developed and well-nourished.  HENT:  Head: Normocephalic and atraumatic.  Eyes:  Conjunctivae and EOM are normal.  Neck: Normal range of motion. Neck supple. No JVD present. Carotid bruit is not present. No thyromegaly present.  Cardiovascular: Normal rate, regular rhythm and normal heart sounds.   No murmur heard. Pulmonary/Chest: Effort normal and breath sounds normal. No respiratory distress. She has no wheezes. She has no rales. She exhibits no tenderness.  Musculoskeletal: She exhibits no edema.  Neurological: She is alert and oriented to person, place, and time.  Psychiatric: She has a normal mood and affect. Her behavior is normal.    BP 116/80 mmHg  Pulse 94  Temp(Src) 98.6 F (37 C) (Oral)  Wt 193 lb 9.6 oz (87.816 kg)  SpO2 97% Wt Readings from Last 3 Encounters:  07/01/15 193 lb 9.6 oz (87.816 kg)  03/24/15 213 lb (96.616 kg)  03/08/15 220 lb 3.8 oz (99.9 kg)     Lab Results  Component Value Date   WBC 10.0 03/07/2015   HGB 12.6 03/07/2015   HCT 37.1 03/07/2015   PLT 194 03/07/2015   GLUCOSE 115* 03/08/2015   CHOL 182 10/28/2009   TRIG 155.0* 10/28/2009   HDL 61.30 10/28/2009   LDLDIRECT 129.1 07/17/2007   LDLCALC 90 10/28/2009   ALT 9 03/07/2015   AST 21 03/07/2015   NA 139 03/08/2015   K 3.3* 03/08/2015   CL 103 03/08/2015   CREATININE 0.70 03/08/2015   BUN 5* 03/08/2015   CO2 30 03/08/2015   TSH 1.26 04/22/2014   INR 1.14 03/07/2015   HGBA1C 5.3 04/22/2014    Lab Results  Component Value Date   TSH 1.26 04/22/2014   Lab Results  Component Value Date   WBC 10.0 03/07/2015   HGB 12.6 03/07/2015   HCT 37.1 03/07/2015   MCV 89.2 03/07/2015   PLT 194 03/07/2015   Lab Results  Component Value Date   NA 139 03/08/2015   K 3.3* 03/08/2015   CO2 30 03/08/2015   GLUCOSE 115* 03/08/2015   BUN 5* 03/08/2015   CREATININE 0.70 03/08/2015   BILITOT 0.9 03/07/2015   ALKPHOS 90 03/07/2015   AST 21 03/07/2015   ALT 9 03/07/2015   PROT 6.3 03/07/2015   ALBUMIN 3.2* 03/07/2015   CALCIUM 8.5 03/08/2015   ANIONGAP 6 03/08/2015    GFR 73.05 04/22/2014   Lab Results  Component Value Date   CHOL 182 10/28/2009   Lab Results  Component Value Date   HDL 61.30 10/28/2009   Lab Results  Component Value Date   LDLCALC 90 10/28/2009   Lab Results  Component Value Date   TRIG 155.0* 10/28/2009   Lab Results  Component Value Date   CHOLHDL 3 10/28/2009   Lab Results  Component Value Date   HGBA1C 5.3 04/22/2014       Assessment & Plan:   Problem List Items Addressed This Visit    Hyperlipidemia    Lipid Panel     Component Value Date/Time   CHOL 182 10/28/2009 0000   TRIG 155.0* 10/28/2009 0000   HDL 61.30 10/28/2009 0000   CHOLHDL 3 10/28/2009 0000   VLDL 31.0 10/28/2009 0000   LDLCALC 90 10/28/2009 0000   LDLDIRECT 129.1 07/17/2007 0840         Essential hypertension    con't norvasc and lasix      DEPRESSION/ANXIETY    Cont effexor      Chronic back pain   ADD (attention deficit disorder) - Primary   Relevant Medications   lisdexamfetamine (VYVANSE) 30 MG capsule    1. ADD (attention deficit disorder) - lisdexamfetamine (VYVANSE) 30 MG capsule; Take 1 capsule (30 mg total) by mouth daily.  Dispense: 30 capsule; Refill: 0  I have discontinued Ms. Rhine ferrous sulfate, Vitamin D3, and cyanocobalamin. I am also having her maintain her aspirin, Teriparatide (Recombinant), cyclobenzaprine, furosemide, SUMAtriptan, Oxycodone HCl, oxymorphone, amLODipine, clonazePAM, venlafaxine XR,  and lisdexamfetamine.  Meds ordered this encounter  Medications  . lisdexamfetamine (VYVANSE) 30 MG capsule    Sig: Take 1 capsule (30 mg total) by mouth daily.    Dispense:  30 capsule    Refill:  0     Loreen Freud, DO

## 2015-07-01 NOTE — Patient Instructions (Signed)

## 2015-07-01 NOTE — Assessment & Plan Note (Signed)
Lipid Panel     Component Value Date/Time   CHOL 182 10/28/2009 0000   TRIG 155.0* 10/28/2009 0000   HDL 61.30 10/28/2009 0000   CHOLHDL 3 10/28/2009 0000   VLDL 31.0 10/28/2009 0000   LDLCALC 90 10/28/2009 0000   LDLDIRECT 129.1 07/17/2007 0840

## 2015-07-01 NOTE — Assessment & Plan Note (Signed)
Cont effexor

## 2015-07-11 DIAGNOSIS — M4120 Other idiopathic scoliosis, site unspecified: Secondary | ICD-10-CM | POA: Diagnosis not present

## 2015-07-11 DIAGNOSIS — Z01818 Encounter for other preprocedural examination: Secondary | ICD-10-CM | POA: Diagnosis not present

## 2015-07-11 DIAGNOSIS — G894 Chronic pain syndrome: Secondary | ICD-10-CM | POA: Diagnosis not present

## 2015-07-11 DIAGNOSIS — M545 Low back pain: Secondary | ICD-10-CM | POA: Diagnosis not present

## 2015-07-17 ENCOUNTER — Other Ambulatory Visit: Payer: Self-pay | Admitting: Family Medicine

## 2015-07-18 ENCOUNTER — Ambulatory Visit: Payer: Medicare PPO | Admitting: Neurology

## 2015-07-21 DIAGNOSIS — I10 Essential (primary) hypertension: Secondary | ICD-10-CM | POA: Diagnosis not present

## 2015-07-21 DIAGNOSIS — M792 Neuralgia and neuritis, unspecified: Secondary | ICD-10-CM | POA: Diagnosis not present

## 2015-07-21 DIAGNOSIS — I509 Heart failure, unspecified: Secondary | ICD-10-CM | POA: Diagnosis not present

## 2015-07-21 DIAGNOSIS — G894 Chronic pain syndrome: Secondary | ICD-10-CM | POA: Diagnosis not present

## 2015-07-21 DIAGNOSIS — M4806 Spinal stenosis, lumbar region: Secondary | ICD-10-CM | POA: Diagnosis not present

## 2015-07-21 DIAGNOSIS — M419 Scoliosis, unspecified: Secondary | ICD-10-CM | POA: Diagnosis not present

## 2015-07-21 DIAGNOSIS — G43909 Migraine, unspecified, not intractable, without status migrainosus: Secondary | ICD-10-CM | POA: Diagnosis not present

## 2015-07-21 DIAGNOSIS — M810 Age-related osteoporosis without current pathological fracture: Secondary | ICD-10-CM | POA: Diagnosis not present

## 2015-07-21 DIAGNOSIS — R0602 Shortness of breath: Secondary | ICD-10-CM | POA: Diagnosis not present

## 2015-07-21 DIAGNOSIS — Z9889 Other specified postprocedural states: Secondary | ICD-10-CM | POA: Diagnosis not present

## 2015-07-21 DIAGNOSIS — M545 Low back pain: Secondary | ICD-10-CM | POA: Diagnosis not present

## 2015-07-21 DIAGNOSIS — M797 Fibromyalgia: Secondary | ICD-10-CM | POA: Diagnosis not present

## 2015-08-22 DIAGNOSIS — M4120 Other idiopathic scoliosis, site unspecified: Secondary | ICD-10-CM | POA: Diagnosis not present

## 2015-09-20 ENCOUNTER — Other Ambulatory Visit: Payer: Self-pay | Admitting: Family Medicine

## 2015-09-22 NOTE — Telephone Encounter (Signed)
Last seen 07/01/15 and filled 06/19/15 #90 with 2   Please advise    KP

## 2015-10-09 ENCOUNTER — Encounter: Payer: Medicare PPO | Admitting: Medical

## 2015-10-09 NOTE — Progress Notes (Signed)
This encounter was created in error - please disregard.

## 2015-10-13 ENCOUNTER — Encounter: Payer: Medicare PPO | Admitting: Medical

## 2015-10-13 DIAGNOSIS — Z0289 Encounter for other administrative examinations: Secondary | ICD-10-CM

## 2015-10-13 NOTE — Progress Notes (Signed)
This encounter was created in error - please disregard.

## 2015-10-15 ENCOUNTER — Telehealth: Payer: Self-pay | Admitting: Medical

## 2015-10-15 NOTE — Telephone Encounter (Signed)
[name] - pt missed appts with you 10/09/15 and 10/13/15 for acute visit for possible bladder inf, these mark the 4th and 5th no shows for pt this year (cc Dr. Laury AxonLowne), charge or no charge for 12/1? For 12/5?  Dr. Laury AxonLowne please evaluate for multiple no shows.

## 2015-10-15 NOTE — Telephone Encounter (Signed)
Per ES charge for missed appointment.

## 2015-10-17 DIAGNOSIS — M4120 Other idiopathic scoliosis, site unspecified: Secondary | ICD-10-CM | POA: Diagnosis not present

## 2015-10-20 NOTE — Telephone Encounter (Signed)
yes

## 2015-11-05 ENCOUNTER — Telehealth: Payer: Self-pay | Admitting: Family Medicine

## 2015-11-05 DIAGNOSIS — F988 Other specified behavioral and emotional disorders with onset usually occurring in childhood and adolescence: Secondary | ICD-10-CM

## 2015-11-05 NOTE — Telephone Encounter (Signed)
Pt never picked up rx for Lisdexamfetamine (Vyvanse) 30 mg from 06-05-2015 (shredded)

## 2015-11-07 MED ORDER — LISDEXAMFETAMINE DIMESYLATE 30 MG PO CAPS: 30.0000 mg | ORAL_CAPSULE | Freq: Every day | ORAL | Status: DC

## 2015-11-07 NOTE — Telephone Encounter (Signed)
Caller name: Self   Can be reached: [phone]    Reason for call: Request refill on lisdexamfetamine (VYVANSE) 30 MG capsule [[id number]

## 2015-11-07 NOTE — Telephone Encounter (Signed)
Spoke with patient and she said she was trying not to take it, she said with the holidays she needs it. Please advise    KP

## 2015-11-07 NOTE — Telephone Encounter (Signed)
Refill x1 

## 2015-11-07 NOTE — Telephone Encounter (Signed)
Was she getting it from somewhere else--- we have not written it in a long while

## 2015-11-07 NOTE — Telephone Encounter (Signed)
Patient aware Rx ready for pick up.      KP 

## 2015-11-07 NOTE — Telephone Encounter (Signed)
Rx has not been filled since 07/01/15 #30, patient was seen 07/01/15   Please advise     KP

## 2015-11-20 ENCOUNTER — Other Ambulatory Visit: Payer: Self-pay | Admitting: Family Medicine

## 2015-11-27 ENCOUNTER — Other Ambulatory Visit: Payer: Self-pay | Admitting: Family Medicine

## 2015-12-02 ENCOUNTER — Telehealth: Payer: Self-pay | Admitting: Family Medicine

## 2015-12-09 ENCOUNTER — Other Ambulatory Visit: Payer: Self-pay | Admitting: Family Medicine

## 2015-12-09 NOTE — Telephone Encounter (Signed)
Caller name: Elowen  Relationship to patient: Self  Can be reached: [phone]  Pharmacy: [facility] - [place], [place] - [address]  Reason for call: pt is requesting a refill on her clonazePAM Rx.

## 2015-12-10 NOTE — Telephone Encounter (Signed)
Last seen 07/01/15 and filled 09/22/15 #90 with 2 refills   Please advise    KP

## 2015-12-10 NOTE — Telephone Encounter (Signed)
.  mychart

## 2015-12-11 MED ORDER — CLONAZEPAM 0.5 MG PO TABS: ORAL_TABLET | ORAL | Status: DC

## 2015-12-11 NOTE — Telephone Encounter (Signed)
Rx faxed.    KP 

## 2015-12-11 NOTE — Addendum Note (Signed)
Addended by: Arnette Norris on: 12/11/2015 08:12 AM   Modules accepted: Orders

## 2015-12-25 ENCOUNTER — Telehealth: Payer: Self-pay | Admitting: Medical

## 2015-12-25 ENCOUNTER — Encounter: Payer: Medicare PPO | Admitting: Medical

## 2015-12-25 NOTE — Progress Notes (Signed)
This encounter was created in error - please disregard.

## 2015-12-29 NOTE — Telephone Encounter (Signed)
charge 

## 2015-12-29 NOTE — Telephone Encounter (Signed)
Pt was no show 12/25/15 10:15am for acute appt, pt has 3 no show with Ramon Dredge and 3 no shows with Dr. Laury Axon since 12/06/14 (just over 1 year), charge or no charge for 2/16?  Adding Dr. Laury Axon to review since 6 no shows in 1 year

## 2015-12-30 ENCOUNTER — Encounter: Payer: Self-pay | Admitting: Family Medicine

## 2015-12-30 NOTE — Telephone Encounter (Signed)
Marked to charge and mailing no show letter °

## 2016-01-07 ENCOUNTER — Telehealth: Payer: Self-pay | Admitting: Family Medicine

## 2016-01-07 ENCOUNTER — Other Ambulatory Visit: Payer: Self-pay | Admitting: Family Medicine

## 2016-01-07 NOTE — Telephone Encounter (Signed)
Last OV: 07/01/15 Last filled: 12/11/15 Sig: TAKE ONE (1) TABLET BY MOUTH 3 TIMES DAILY AS NEEDED FOR ANXIETY UDS: 04/22/14

## 2016-01-07 NOTE — Telephone Encounter (Signed)
Pt says that she misplaced the bottle of medication . She would like to know what could she do? ( clonazePAM )   CB: [phone]

## 2016-01-08 MED ORDER — CLONAZEPAM 0.5 MG PO TABS: ORAL_TABLET | ORAL | Status: DC

## 2016-01-08 NOTE — Telephone Encounter (Signed)
Rx faxed.--Duplicate encounter

## 2016-01-08 NOTE — Addendum Note (Signed)
Addended by: Starla Link on: 01/08/2016 08:08 AM   Modules accepted: Orders

## 2016-02-09 ENCOUNTER — Telehealth: Payer: Self-pay | Admitting: Family Medicine

## 2016-02-09 NOTE — Telephone Encounter (Signed)
Can be reached: [phone] Pharmacy: [facility]  Reason for call: Pt called for refill on clonazepam. She has enough for a few days.

## 2016-02-09 NOTE — Telephone Encounter (Signed)
Refill x1 

## 2016-02-09 NOTE — Telephone Encounter (Signed)
Last seen 07/01/15 and filled 01/08/16 #90   Please advise     KP

## 2016-02-10 MED ORDER — CLONAZEPAM 0.5 MG PO TABS: ORAL_TABLET | ORAL | Status: DC

## 2016-02-10 NOTE — Telephone Encounter (Addendum)
Rx pendng for provider signature.    KP

## 2016-02-10 NOTE — Telephone Encounter (Signed)
Rx faxed.    KP 

## 2016-02-17 ENCOUNTER — Telehealth: Payer: Self-pay | Admitting: Family Medicine

## 2016-02-17 DIAGNOSIS — Z1159 Encounter for screening for other viral diseases: Secondary | ICD-10-CM

## 2016-02-17 NOTE — Telephone Encounter (Signed)
Ok to schedule lab appt for Hep C testing?

## 2016-02-17 NOTE — Telephone Encounter (Signed)
Ok to schedule hep c---dx need for hep c

## 2016-02-17 NOTE — Telephone Encounter (Signed)
Caller name:Alzora Relationship to patient: Can be [phone] Pharmacy:  Reason for call:She wants to be tested for hep c

## 2016-02-18 NOTE — Telephone Encounter (Signed)
Patient scheduled for lab 02/19/2016

## 2016-02-18 NOTE — Telephone Encounter (Signed)
Please schedule a lab visit. Orders are in.    KP 

## 2016-02-19 ENCOUNTER — Other Ambulatory Visit: Payer: Self-pay

## 2016-02-23 ENCOUNTER — Other Ambulatory Visit (INDEPENDENT_AMBULATORY_CARE_PROVIDER_SITE_OTHER): Payer: Medicare Other

## 2016-02-23 DIAGNOSIS — Z1159 Encounter for screening for other viral diseases: Secondary | ICD-10-CM

## 2016-02-24 LAB — HEPATITIS C ANTIBODY: HCV Ab: NEGATIVE

## 2016-03-09 ENCOUNTER — Telehealth: Payer: Self-pay | Admitting: Family Medicine

## 2016-03-09 MED ORDER — CLONAZEPAM 0.5 MG PO TABS: ORAL_TABLET | ORAL | Status: DC

## 2016-03-09 NOTE — Telephone Encounter (Signed)
Caller name: Self  Can be reached: [phone]   Pharmacy:  [facility] - [place], [place] - [address] [phone] (Phone) [phone] (Fax)       Reason for call: Refill clonazePAM (KLONOPIN) 0.5 MG tablet [[id number]][[id number]]

## 2016-03-09 NOTE — Telephone Encounter (Signed)
Refill x1 

## 2016-03-09 NOTE — Telephone Encounter (Signed)
Last seen 07/01/15 and filled 02/10/16 #90  Please advise    KP

## 2016-03-10 NOTE — Telephone Encounter (Signed)
Rx pending signature, will fax tomorrow once signed by MD.    Elaina PatteeKP

## 2016-03-16 ENCOUNTER — Telehealth: Payer: Self-pay | Admitting: Family Medicine

## 2016-03-16 NOTE — Telephone Encounter (Signed)
Last seen 07/01/15 and filled 11/07/15 #30 UDS 04/22/14 low risk  Please advise     KP

## 2016-03-16 NOTE — Telephone Encounter (Signed)
Pt overdue for ov

## 2016-03-16 NOTE — Telephone Encounter (Signed)
Caller name:Self  Can be reached: [phone]   Reason for call: Refill lisdexamfetamine (VYVANSE) 30 MG capsule [[id number]][[id number]]

## 2016-03-16 NOTE — Telephone Encounter (Signed)
She needs to be seen for refills.    KP

## 2016-03-22 ENCOUNTER — Encounter: Payer: Self-pay | Admitting: Family Medicine

## 2016-03-22 ENCOUNTER — Ambulatory Visit (HOSPITAL_BASED_OUTPATIENT_CLINIC_OR_DEPARTMENT_OTHER)
Admission: RE | Admit: 2016-03-22 | Discharge: 2016-03-22 | Disposition: A | Discharge status: Home / Self Care | Payer: Medicare Other | Source: Ambulatory Visit | Attending: Family Medicine | Admitting: Family Medicine

## 2016-03-22 ENCOUNTER — Ambulatory Visit (INDEPENDENT_AMBULATORY_CARE_PROVIDER_SITE_OTHER): Payer: Medicare Other | Admitting: Family Medicine

## 2016-03-22 VITALS — BP 138/90 | HR 89 | Temp 98.6°F | Ht 63.0 in | Wt 191.2 lb

## 2016-03-22 DIAGNOSIS — F909 Attention-deficit hyperactivity disorder, unspecified type: Secondary | ICD-10-CM

## 2016-03-22 DIAGNOSIS — F988 Other specified behavioral and emotional disorders with onset usually occurring in childhood and adolescence: Secondary | ICD-10-CM

## 2016-03-22 DIAGNOSIS — F411 Generalized anxiety disorder: Secondary | ICD-10-CM

## 2016-03-22 DIAGNOSIS — J209 Acute bronchitis, unspecified: Secondary | ICD-10-CM | POA: Diagnosis not present

## 2016-03-22 DIAGNOSIS — R05 Cough: Secondary | ICD-10-CM | POA: Diagnosis present

## 2016-03-22 MED ORDER — CLONAZEPAM 0.5 MG PO TABS: ORAL_TABLET | ORAL | Status: DC

## 2016-03-22 MED ORDER — VENLAFAXINE HCL ER 75 MG PO CP24: 225.0000 mg | ORAL_CAPSULE | Freq: Every day | ORAL | Status: DC

## 2016-03-22 MED ORDER — LISDEXAMFETAMINE DIMESYLATE 30 MG PO CAPS: 30.0000 mg | ORAL_CAPSULE | Freq: Every day | ORAL | Status: DC

## 2016-03-22 MED ORDER — CLARITHROMYCIN ER 500 MG PO TB24: 1000.0000 mg | ORAL_TABLET | Freq: Every day | ORAL | Status: AC

## 2016-03-22 NOTE — Progress Notes (Signed)
Subjective:     Burnard HawthorneRita C Koren is a 70 y.o. female here for evaluation of a cough. Onset of symptoms was 10 days ago. Symptoms have been gradually worsening since that time. The cough is productive and is aggravated by exercise and pollens. Associated symptoms include: shortness of breath, sputum production and wheezing. Patient does not have a history of asthma. Patient does not have a history of environmental allergens. Patient has traveled recently. Patient does not have a history of smoking. Patient has had a previous chest x-ray. Patient has not had a PPD done.  The following portions of the patient's history were reviewed and updated as appropriate:  She  has a past medical history of Anxiety; HYPERLIPIDEMIA (05/15/2007); ADD (attention deficit disorder) (05/24/2012); DIZZINESS (12/31/2009); GERD (12/03/2008); Idiopathic scoliosis (04/19/2013); Osteoporosis, unspecified (04/19/2013); Pica (12/28/2007); PALPITATIONS, RECURRENT (12/12/2009); PSTPRC STATUS, BARIATRIC SURGERY (05/15/2007); TREMOR, ESSENTIAL (12/12/2009); Unspecified Myalgia and Myositis (05/15/2007); Chronic back pain; Hypertension; Crushing injury of arm, right (02/06/1989's); Crushing injury of left wrist (05/11/1989's); Congestive heart disease (HCC); Pneumonia (10/02/2013); ANEMIA, MILD (06/26/2008); Other vitamin B12 deficiency anemia (10/30/2009); Migraines; Arthritis; Fibromyalgia; and Pulmonary nodules/lesions, multiple. She  does not have any pertinent problems on file. She  has past surgical history that includes Gastric bypass (~ 2003); Abdominal hysterectomy (1980's); Knee arthroscopy (Right, 2002); Tonsillectomy and adenoidectomy (1955); Myomectomy (1980's); Appendectomy (1980's); Tubal ligation (1980's); Reduction mammaplasty (Bilateral, 2001); Refractive surgery (Bilateral, 2004); and Closed reduction hand fracture (Right, 1990's). Her family history includes COPD in her mother; Emphysema in her mother; Heart attack in her mother; Other in her  mother. She was adopted. She  reports that she has never smoked. She has never used smokeless tobacco. She reports that she drinks alcohol. She reports that she does not use illicit drugs. She has a current medication list which includes the following prescription(s): amitriptyline, amlodipine, aspirin, clonazepam, cyclobenzaprine, furosemide, lisdexamfetamine, meloxicam, oxycodone hcl, oxymorphone, sumatriptan, teriparatide (recombinant), and venlafaxine xr. Current Outpatient Prescriptions on File Prior to Visit  Medication Sig Dispense Refill  . amLODipine (NORVASC) 5 MG tablet Take 1 tablet (5 mg total) by mouth daily. 180 tablet 3  . aspirin 81 MG tablet Take 81 mg by mouth daily.    . clonazePAM (KLONOPIN) 0.5 MG tablet TAKE ONE (1) TABLET BY MOUTH 3 TIMES DAILY AS NEEDED FOR ANXIETY 90 tablet 0  . cyclobenzaprine (FLEXERIL) 10 MG tablet Take 10 mg by mouth 3 (three) times daily as needed for muscle spasms.    . furosemide (LASIX) 40 MG tablet Take 1 tablet (40 mg total) by mouth daily. 90 tablet 3  . lisdexamfetamine (VYVANSE) 30 MG capsule Take 1 capsule (30 mg total) by mouth daily. 30 capsule 0  . Oxycodone HCl 20 MG TABS Take 1 tablet by mouth 4 (four) times daily as needed.    [name]. oxymorphone (OPANA ER) 15 MG 12 hr tablet Take 1 tablet by mouth 2 (two) times daily.    . SUMAtriptan (IMITREX) 50 MG tablet TAKE 1 TABLET AT ONSET OF HEADACHE. MAY REPEAT IN 2 HOURS IF NEEDED 10 tablet 5  . Teriparatide, Recombinant, (FORTEO) 600 MCG/2.4ML SOLN Inject 0.08 mLs (20 mcg total) into the skin daily. 2.4 mL 11  . venlafaxine XR (EFFEXOR-XR) 75 MG 24 hr capsule TAKE 3 CAPSULES DAILY 90 capsule 2   No current facility-administered medications on file prior to visit.   She has No Known Allergies..  Review of Systems Pertinent items are noted in HPI.    Objective:  Oxygen saturation 97% on room air BP 138/90 mmHg  Pulse 89  Temp(Src) 98.6 F (37 C) (Oral)  Ht  (1.6 m)  Wt 191 lb  3.2 oz (86.728 kg)  BMI 33.88 kg/m2  SpO2 97% General appearance: alert, cooperative, appears stated age and no distress Ears: normal TM's and external ear canals both ears Nose: Nares normal. Septum midline. Mucosa normal. No drainage or sinus tenderness. Throat: lips, mucosa, and tongue normal; teeth and gums normal Neck: no adenopathy, supple, symmetrical, trachea midline and thyroid not enlarged, symmetric, no tenderness/mass/nodules Lungs: rhonchi bilaterally and wheezes bilaterally Heart: S1, S2 normal    Assessment:    Acute Bronchitis    Plan:    Antibiotics per medication orders. Avoid exposure to tobacco smoke and fumes. Call if shortness of breath worsens, blood in sputum, change in character of cough, development of fever or chills, inability to maintain nutrition and hydration. Avoid exposure to tobacco smoke and fumes. Chest x-ray. pred taper

## 2016-03-22 NOTE — Progress Notes (Signed)
Pre visit review using our clinic review tool, if applicable. No additional management support is needed unless otherwise documented below in the visit note. 

## 2016-03-22 NOTE — Patient Instructions (Signed)

## 2016-03-26 ENCOUNTER — Other Ambulatory Visit: Payer: Self-pay | Admitting: Emergency Medicine

## 2016-03-26 MED ORDER — SUMATRIPTAN SUCCINATE 50 MG PO TABS: ORAL_TABLET | ORAL | Status: DC

## 2016-04-09 ENCOUNTER — Other Ambulatory Visit: Payer: Self-pay | Admitting: Cardiology

## 2016-04-09 DIAGNOSIS — R918 Other nonspecific abnormal finding of lung field: Secondary | ICD-10-CM

## 2016-04-09 DIAGNOSIS — R0789 Other chest pain: Secondary | ICD-10-CM

## 2016-04-09 DIAGNOSIS — I10 Essential (primary) hypertension: Secondary | ICD-10-CM

## 2016-04-09 DIAGNOSIS — J189 Pneumonia, unspecified organism: Secondary | ICD-10-CM

## 2016-04-09 MED ORDER — AMLODIPINE BESYLATE 5 MG PO TABS: 5.0000 mg | ORAL_TABLET | Freq: Every day | ORAL | Status: DC

## 2016-04-26 ENCOUNTER — Telehealth: Payer: Self-pay | Admitting: Behavioral Health

## 2016-04-26 NOTE — Telephone Encounter (Signed)
[employer] note received via fax  Call Date: 04/23/16 Time: 5:32 PM   Caller: Marge Duncansita Meegan (Self) Return number: [phone]  Nurse: Heinz KnucklesBeth Solocinski, RN  Chief Complaint: Headache  Reason for call: Caller states out of headache meds has a headache  Disposition: Clinical Call   Patient voiced that she takes Immitrex for migraines and was requesting a refill on her medication. According to the patient's chart, medication was last filled on 03/26/16 with 5 refills. Patient reported that her medication bottle reads the same as well. She's going to call her pharmacy to clarify that there are still refills available. Patient will inform office if there are any discrepancies. Assessed if migraines were occurring more frequently and offered the patient an appointment to be seen with PCP, but she declined. Currently the patient is asymptomatic, but she would like to have medication present if there is an onset of a migraine. Prior to call ending, the patient did not have any further questions or concerns.

## 2016-05-05 ENCOUNTER — Other Ambulatory Visit: Payer: Self-pay | Admitting: Cardiology

## 2016-05-05 DIAGNOSIS — R0789 Other chest pain: Secondary | ICD-10-CM

## 2016-05-05 DIAGNOSIS — I10 Essential (primary) hypertension: Secondary | ICD-10-CM

## 2016-05-05 DIAGNOSIS — R918 Other nonspecific abnormal finding of lung field: Secondary | ICD-10-CM

## 2016-05-05 DIAGNOSIS — J189 Pneumonia, unspecified organism: Secondary | ICD-10-CM

## 2016-05-05 MED ORDER — AMLODIPINE BESYLATE 5 MG PO TABS: 5.0000 mg | ORAL_TABLET | Freq: Every day | ORAL | Status: DC

## 2016-05-19 ENCOUNTER — Encounter: Payer: Self-pay | Admitting: Family Medicine

## 2016-05-19 NOTE — Telephone Encounter (Signed)
error:[id number] ° °

## 2016-05-21 ENCOUNTER — Other Ambulatory Visit: Payer: Self-pay

## 2016-05-21 DIAGNOSIS — J189 Pneumonia, unspecified organism: Secondary | ICD-10-CM

## 2016-05-21 DIAGNOSIS — R0789 Other chest pain: Secondary | ICD-10-CM

## 2016-05-21 DIAGNOSIS — I10 Essential (primary) hypertension: Secondary | ICD-10-CM

## 2016-05-21 DIAGNOSIS — R918 Other nonspecific abnormal finding of lung field: Secondary | ICD-10-CM

## 2016-05-21 MED ORDER — AMLODIPINE BESYLATE 5 MG PO TABS: 5.0000 mg | ORAL_TABLET | Freq: Every day | ORAL | Status: DC

## 2016-06-01 ENCOUNTER — Telehealth: Payer: Self-pay | Admitting: Family Medicine

## 2016-06-01 DIAGNOSIS — F988 Other specified behavioral and emotional disorders with onset usually occurring in childhood and adolescence: Secondary | ICD-10-CM

## 2016-06-01 MED ORDER — LISDEXAMFETAMINE DIMESYLATE 30 MG PO CAPS: 30.0000 mg | ORAL_CAPSULE | Freq: Every day | ORAL | 0 refills | Status: DC

## 2016-06-01 NOTE — Telephone Encounter (Signed)
Last seen and filled 03/22/16  UDS 04/22/14 low risk   Please advise   KP

## 2016-06-01 NOTE — Telephone Encounter (Signed)
°  Relationship to patient: Self Can be reached: [phone]    Reason for call: Refill on lisdexamfetamine (VYVANSE) 30 MG capsule

## 2016-06-01 NOTE — Telephone Encounter (Signed)
Refill x1 

## 2016-06-01 NOTE — Telephone Encounter (Signed)
Patient aware Rx will be ready for pick up tomorrow.      KP 

## 2016-06-09 ENCOUNTER — Ambulatory Visit: Payer: Medicare Other | Admitting: Physician Assistant

## 2016-06-25 ENCOUNTER — Emergency Department (HOSPITAL_BASED_OUTPATIENT_CLINIC_OR_DEPARTMENT_OTHER)
Admission: EM | Admit: 2016-06-25 | Discharge: 2016-06-25 | Disposition: A | Discharge status: Home / Self Care | Payer: Medicare Other | Attending: Emergency Medicine | Admitting: Emergency Medicine

## 2016-06-25 ENCOUNTER — Emergency Department (HOSPITAL_BASED_OUTPATIENT_CLINIC_OR_DEPARTMENT_OTHER): Payer: Medicare Other

## 2016-06-25 ENCOUNTER — Encounter (HOSPITAL_BASED_OUTPATIENT_CLINIC_OR_DEPARTMENT_OTHER): Payer: Self-pay

## 2016-06-25 DIAGNOSIS — Y9301 Activity, walking, marching and hiking: Secondary | ICD-10-CM | POA: Diagnosis not present

## 2016-06-25 DIAGNOSIS — W0110XA Fall on same level from slipping, tripping and stumbling with subsequent striking against unspecified object, initial encounter: Secondary | ICD-10-CM | POA: Diagnosis not present

## 2016-06-25 DIAGNOSIS — I110 Hypertensive heart disease with heart failure: Secondary | ICD-10-CM | POA: Diagnosis not present

## 2016-06-25 DIAGNOSIS — F909 Attention-deficit hyperactivity disorder, unspecified type: Secondary | ICD-10-CM | POA: Insufficient documentation

## 2016-06-25 DIAGNOSIS — Z7982 Long term (current) use of aspirin: Secondary | ICD-10-CM | POA: Diagnosis not present

## 2016-06-25 DIAGNOSIS — I509 Heart failure, unspecified: Secondary | ICD-10-CM | POA: Diagnosis not present

## 2016-06-25 DIAGNOSIS — Y929 Unspecified place or not applicable: Secondary | ICD-10-CM | POA: Diagnosis not present

## 2016-06-25 DIAGNOSIS — S0181XA Laceration without foreign body of other part of head, initial encounter: Secondary | ICD-10-CM

## 2016-06-25 DIAGNOSIS — Y999 Unspecified external cause status: Secondary | ICD-10-CM | POA: Diagnosis not present

## 2016-06-25 DIAGNOSIS — S0990XA Unspecified injury of head, initial encounter: Secondary | ICD-10-CM | POA: Diagnosis present

## 2016-06-25 MED ORDER — LIDOCAINE-EPINEPHRINE (PF) 1 %-1:200000 IJ SOLN
INTRAMUSCULAR | Status: AC
  Administered 2016-06-25: 15:00:00
  Filled 2016-06-25: qty 30

## 2016-06-25 MED ORDER — LIDOCAINE-EPINEPHRINE 1 %-1:100000 IJ SOLN: 10.0000 mL | Freq: Once | INTRAMUSCULAR | Status: DC

## 2016-06-25 NOTE — ED Provider Notes (Signed)
[facility]   CSN: [id number] Arrival date & time: 06/25/16  1255     History   Chief Complaint Chief Complaint  Patient presents with  . Fall    HPI [name] is a 70 y.o. female.  70yo F w/ PMH including chronic back pain on chronic opiates, HTN, HLD, fibromyalgia who p/w fall and head injury. Approximately 2 hours prior to arrival, the patient was walking entire get when she tripped over a wire and fell forward, striking her forehead and left face. She did not lose consciousness and was ambulatory after the event. She reports constant, moderate pain of her forehead and below her left eye. Mild tenderness of her knees where she fell. No problems walking. No extremity numbness/weakness, no visual changes and no neck pain. No chest or abdominal pain. Tetanus is up-to-date.   The history is provided by the patient.  Fall     Past Medical History:  Diagnosis Date  . ADD (attention deficit disorder) 05/24/2012  . ANEMIA, MILD 06/26/2008   Qualifier: Diagnosis of  By: Janit BernLowne DO, Yvonne    . Anxiety   . Arthritis    "joints, back" (10/02/2013)  . Chronic back pain    "all over my back" (10/02/2013)  . Congestive heart disease (HCC)    "I have the beginning signs" (10/02/2013)  . Crushing injury of arm, right 02/06/1989's   "it was crushed; wore cast from fingers to top of my shoulder" (11/25/2  . Crushing injury of left wrist 05/11/1989's  . DIZZINESS 12/31/2009   Qualifier: Diagnosis of  By: Oswald HillockGraham-Oliver, Karen    . Fibromyalgia   . GERD 12/03/2008   Qualifier: Diagnosis of  By: Freddy JakschHooks, Phyllis    . HYPERLIPIDEMIA 05/15/2007   Qualifier: Diagnosis of  By: Janit BernLowne DO, Yvonne ; pt denies this hx on 10/02/2013  . Hypertension   . Idiopathic scoliosis 04/19/2013  . Migraines    "once/month" (10/02/2013)  . Osteoporosis, unspecified 04/19/2013  . Other vitamin B12 deficiency anemia 10/30/2009   Qualifier: Diagnosis of  By: Floydene FlockHoops, Regina    . PALPITATIONS,  RECURRENT 12/12/2009   Qualifier: Diagnosis of  By: Janit BernLowne DO, Yvonne    . Pica 12/28/2007   Qualifier: Diagnosis of  By: Janit BernLowne DO, Yvonne    . Pneumonia 10/02/2013   "had onsets of it before; not full blown til now" (10/02/2013)  . PSTPRC STATUS, BARIATRIC SURGERY 05/15/2007   Qualifier: Diagnosis of  By: Janit BernLowne DO, Yvonne    . Pulmonary nodules/lesions, multiple   . TREMOR, ESSENTIAL 12/12/2009   Qualifier: Diagnosis of  By: Janit BernLowne DO, Yvonne    . Unspecified Myalgia and Myositis 05/15/2007   Centricity Description: FIBROMYALGIA Qualifier: Diagnosis of  By: Janit BernLowne DO, Yvonne   Centricity Description: MYALGIA Qualifier: Diagnosis of  By: Janit BernLowne DO, Yvonne      Patient Active Problem List   Diagnosis Date Noted  . Sepsis (HCC) 03/07/2015  . Hypokalemia 03/07/2015  . Acute encephalopathy 03/06/2015  . Fever 03/06/2015  . Chronic back pain 01/15/2015  . Obesity (BMI 30-39.9) 10/15/2013  . Pulmonary nodules 10/15/2013  . Community acquired pneumonia 10/02/2013  . CAP (community acquired pneumonia) 10/02/2013  . Essential hypertension 08/24/2013  . Idiopathic scoliosis 04/19/2013  . Back pain 04/19/2013  . Osteoporosis 04/19/2013  . Edema 04/19/2013  . Mild diastolic dysfunction 01/01/2013  . Leg pain, bilateral 12/16/2012  . ADD (attention deficit disorder) 05/24/2012  . Involuntary movements 05/24/2012  . DIZZINESS 12/31/2009  .  TREMOR, ESSENTIAL 12/12/2009  . PALPITATIONS, RECURRENT 12/12/2009  . OTHER VITAMIN B12 DEFICIENCY ANEMIA 10/30/2009  . FEMALE STRESS INCONTINENCE 10/28/2009  . MEMORY LOSS 10/28/2009  . Morbid obesity (HCC) 12/03/2008  . GERD 12/03/2008  . ANEMIA, MILD 06/26/2008  . DEPRESSION/ANXIETY 12/28/2007  . PICA 12/28/2007  . ACUTE BRONCHITIS 12/28/2007  . FATIGUE 12/28/2007  . Hyperlipidemia 05/15/2007  . ARTIFICIAL MENOPAUSE 05/15/2007  . Myalgia and myositis, unspecified 05/15/2007  . HEADACHE 05/15/2007  . CHEST PAIN, ATYPICAL 05/15/2007  . SYMPTOM, PAIN,  ABDOMINAL, EPIGASTRIC 05/15/2007  . REDUCTION MAMMOPLASTY, HX OF 05/15/2007  . PSTPRC STATUS, BARIATRIC SURGERY 05/15/2007    Past Surgical History:  Procedure Laterality Date  . ABDOMINAL HYSTERECTOMY  1980's  . APPENDECTOMY  1980's   "w/either my hysterectomy or myomectomy" (10/02/2013)  . BACK SURGERY    . CLOSED REDUCTION HAND FRACTURE Right 1990's   "it was crushed; wore cast from fingers to top of my shoulder" (10/02/2013)  . GASTRIC BYPASS  ~ 2003  . KNEE ARTHROSCOPY Right 2002  . MYOMECTOMY  1980's  . REDUCTION MAMMAPLASTY Bilateral 2001  . REFRACTIVE SURGERY Bilateral 2004  . SPINAL CORD STIMULATOR IMPLANT    . TONSILLECTOMY AND ADENOIDECTOMY  1955  . TUBAL LIGATION  1980's    OB History    No data available       Home Medications    Prior to Admission medications   Medication Sig Start Date End Date Taking? Authorizing Provider  amitriptyline (ELAVIL) 75 MG tablet Take 1 tablet by mouth daily. 03/04/16   Historical Provider, MD  amLODipine (NORVASC) 5 MG tablet Take 1 tablet (5 mg total) by mouth daily. 05/21/16   Lars MassonKatarina H Nelson, MD  aspirin 81 MG tablet Take 81 mg by mouth daily.    Historical Provider, MD  clonazePAM (KLONOPIN) 0.5 MG tablet TAKE ONE (1) TABLET BY MOUTH 3 TIMES DAILY AS NEEDED FOR ANXIETY 03/22/16   Grayling CongressYvonne R Lowne Chase, DO  cyclobenzaprine (FLEXERIL) 10 MG tablet Take 10 mg by mouth 3 (three) times daily as needed for muscle spasms.    Historical Provider, MD  furosemide (LASIX) 40 MG tablet Take 1 tablet (40 mg total) by mouth daily. 01/23/15   Lars MassonKatarina H Nelson, MD  lisdexamfetamine (VYVANSE) 30 MG capsule Take 1 capsule (30 mg total) by mouth daily. 06/01/16   Lelon PerlaYvonne R Lowne Chase, DO  meloxicam (MOBIC) 15 MG tablet Take 1 tablet by mouth daily. 02/16/16   Historical Provider, MD  Oxycodone HCl 20 MG TABS Take 1 tablet by mouth 4 (four) times daily as needed. 03/06/15   Historical Provider, MD  oxymorphone (OPANA ER) 15 MG 12 hr tablet Take 1  tablet by mouth 2 (two) times daily. 03/14/15   Max Dalene SeltzerW Cohen, MD  SUMAtriptan (IMITREX) 50 MG tablet TAKE 1 TABLET AT ONSET OF HEADACHE. MAY REPEAT IN 2 HOURS IF NEEDED 03/26/16   Lelon PerlaYvonne R Lowne Chase, DO  Teriparatide, Recombinant, (FORTEO) 600 MCG/2.4ML SOLN Inject 0.08 mLs (20 mcg total) into the skin daily. 06/13/14   Lelon PerlaYvonne R Lowne Chase, DO  venlafaxine XR (EFFEXOR-XR) 75 MG 24 hr capsule Take 3 capsules (225 mg total) by mouth daily. 03/22/16   Donato SchultzYvonne R Lowne Chase, DO    Family History Family History  Problem Relation Age of Onset  . Adopted: Yes  . COPD Mother   . Emphysema Mother   . Heart attack Mother   . Other Mother     tobacco abuse  Social History Social History  Substance Use Topics  . Smoking status: Never Smoker  . Smokeless tobacco: Never Used  . Alcohol use No     Allergies   Review of patient's allergies indicates no known allergies.   Review of Systems Review of Systems 10 Systems reviewed and are negative for acute change except as noted in the HPI.   Physical Exam Updated Vital Signs BP 152/76 (BP Location: Right Arm)   Pulse 76   Temp 98.6 F (37 C) (Oral)   Resp 20   Ht 5\' 5"  (1.651 m)   Wt 195 lb (88.5 kg)   SpO2 95%   BMI 32.45 kg/m   Physical Exam  Constitutional: She is oriented to person, place, and time. She appears well-developed and well-nourished. No distress.  Awake, alert  HENT:  3cm laceration above L eye brow w/ small arteriole bleeding; ecchymosis, swelling , and tenderness below L eye  Eyes: Conjunctivae and EOM are normal. Pupils are equal, round, and reactive to light.  Neck: Normal range of motion. Neck supple.  Cardiovascular: Normal rate, regular rhythm and normal heart sounds.   No murmur heard. Pulmonary/Chest: Effort normal and breath sounds normal. No respiratory distress.  Abdominal: Soft. Bowel sounds are normal. She exhibits no distension.  Musculoskeletal: She exhibits no edema, tenderness or deformity.    Neurological: She is alert and oriented to person, place, and time. She has normal reflexes. No cranial nerve deficit. She exhibits normal muscle tone.  Fluent speech 5/5 strength and normal sensation x all 4 extremities  Skin: Skin is warm and dry.  Psychiatric: She has a normal mood and affect. Judgment and thought content normal.  Nursing note and vitals reviewed.    ED Treatments / Results  Labs (all labs ordered are listed, but only abnormal results are displayed) Labs Reviewed - No data to display  EKG  EKG Interpretation None       Radiology Ct Head Wo Contrast  Result Date: 06/25/2016 CLINICAL DATA:  Patient fell and hit forehead. Left periorbital swelling. EXAM: CT HEAD WITHOUT CONTRAST CT MAXILLOFACIAL WITHOUT CONTRAST CT CERVICAL SPINE WITHOUT CONTRAST TECHNIQUE: Multidetector CT imaging of the head, cervical spine, and maxillofacial structures were performed using the standard protocol without intravenous contrast. Multiplanar CT image reconstructions of the cervical spine and maxillofacial structures were also generated. COMPARISON:  October 02, 2013. FINDINGS: CT HEAD FINDINGS There is soft tissue swelling around the left eye. The underlying globe and intraconal contents are normal. Extracranial soft tissues are otherwise normal. The bones, paranasal sinuses, mastoid air cells, and middle ears are unremarkable. No subdural, epidural, or subarachnoid hemorrhage. No mass, mass effect, or midline shift. The ventricles and sulci are stable. The cerebellum, brainstem, and basal cisterns are normal. No acute cortical ischemia or infarct identified. Scattered white matter changes are stable. CT MAXILLOFACIAL FINDINGS Soft tissue swelling is seen around the left orbit. The underlying globe is intact. No other soft tissue abnormalities. No fractures. CT CERVICAL SPINE FINDINGS There is a 2.2 mm of anterior listhesis of C3 versus C4. There is 1 mm of anterior listhesis of C6 versus C7.  There is 3 mm of anterolisthesis of C7 versus T1. No other malalignment identified. No fractures are identified. Multilevel degenerative changes are seen with degenerative disc disease. Moderate to severe facet degenerative changes are seen as well. Of note, the anterior listhesis of C3 versus C4 was present in 2013 but is mildly more prominent. The 1 mm of anterolisthesis of C6  versus C7 is stable. The anterior listhesis of C7 versus T1 also appears to have been present. IMPRESSION: 1. Soft tissue swelling around the left thigh with no underlying fractures. No facial bone fractures. 2. No acute intracranial process. 3. No cervical spine fractures. Mild malalignment at C3-4, C6-7, and C7-T1 is all similar when compared to 2013 and favored to be degenerative. Electronically Signed   By: Gerome Sam III M.D   On: 06/25/2016 14:44   Ct Cervical Spine Wo Contrast  Result Date: 06/25/2016 CLINICAL DATA:  Patient fell and hit forehead. Left periorbital swelling. EXAM: CT HEAD WITHOUT CONTRAST CT MAXILLOFACIAL WITHOUT CONTRAST CT CERVICAL SPINE WITHOUT CONTRAST TECHNIQUE: Multidetector CT imaging of the head, cervical spine, and maxillofacial structures were performed using the standard protocol without intravenous contrast. Multiplanar CT image reconstructions of the cervical spine and maxillofacial structures were also generated. COMPARISON:  October 02, 2013. FINDINGS: CT HEAD FINDINGS There is soft tissue swelling around the left eye. The underlying globe and intraconal contents are normal. Extracranial soft tissues are otherwise normal. The bones, paranasal sinuses, mastoid air cells, and middle ears are unremarkable. No subdural, epidural, or subarachnoid hemorrhage. No mass, mass effect, or midline shift. The ventricles and sulci are stable. The cerebellum, brainstem, and basal cisterns are normal. No acute cortical ischemia or infarct identified. Scattered white matter changes are stable. CT MAXILLOFACIAL  FINDINGS Soft tissue swelling is seen around the left orbit. The underlying globe is intact. No other soft tissue abnormalities. No fractures. CT CERVICAL SPINE FINDINGS There is a 2.2 mm of anterior listhesis of C3 versus C4. There is 1 mm of anterior listhesis of C6 versus C7. There is 3 mm of anterolisthesis of C7 versus T1. No other malalignment identified. No fractures are identified. Multilevel degenerative changes are seen with degenerative disc disease. Moderate to severe facet degenerative changes are seen as well. Of note, the anterior listhesis of C3 versus C4 was present in 2013 but is mildly more prominent. The 1 mm of anterolisthesis of C6 versus C7 is stable. The anterior listhesis of C7 versus T1 also appears to have been present. IMPRESSION: 1. Soft tissue swelling around the left thigh with no underlying fractures. No facial bone fractures. 2. No acute intracranial process. 3. No cervical spine fractures. Mild malalignment at C3-4, C6-7, and C7-T1 is all similar when compared to 2013 and favored to be degenerative. Electronically Signed   By: Gerome Sam III M.D   On: 06/25/2016 14:44   Ct Maxillofacial Wo Cm  Result Date: 06/25/2016 CLINICAL DATA:  Patient fell and hit forehead. Left periorbital swelling. EXAM: CT HEAD WITHOUT CONTRAST CT MAXILLOFACIAL WITHOUT CONTRAST CT CERVICAL SPINE WITHOUT CONTRAST TECHNIQUE: Multidetector CT imaging of the head, cervical spine, and maxillofacial structures were performed using the standard protocol without intravenous contrast. Multiplanar CT image reconstructions of the cervical spine and maxillofacial structures were also generated. COMPARISON:  October 02, 2013. FINDINGS: CT HEAD FINDINGS There is soft tissue swelling around the left eye. The underlying globe and intraconal contents are normal. Extracranial soft tissues are otherwise normal. The bones, paranasal sinuses, mastoid air cells, and middle ears are unremarkable. No subdural, epidural,  or subarachnoid hemorrhage. No mass, mass effect, or midline shift. The ventricles and sulci are stable. The cerebellum, brainstem, and basal cisterns are normal. No acute cortical ischemia or infarct identified. Scattered white matter changes are stable. CT MAXILLOFACIAL FINDINGS Soft tissue swelling is seen around the left orbit. The underlying globe is intact. No other soft  tissue abnormalities. No fractures. CT CERVICAL SPINE FINDINGS There is a 2.2 mm of anterior listhesis of C3 versus C4. There is 1 mm of anterior listhesis of C6 versus C7. There is 3 mm of anterolisthesis of C7 versus T1. No other malalignment identified. No fractures are identified. Multilevel degenerative changes are seen with degenerative disc disease. Moderate to severe facet degenerative changes are seen as well. Of note, the anterior listhesis of C3 versus C4 was present in 2013 but is mildly more prominent. The 1 mm of anterolisthesis of C6 versus C7 is stable. The anterior listhesis of C7 versus T1 also appears to have been present. IMPRESSION: 1. Soft tissue swelling around the left thigh with no underlying fractures. No facial bone fractures. 2. No acute intracranial process. 3. No cervical spine fractures. Mild malalignment at C3-4, C6-7, and C7-T1 is all similar when compared to 2013 and favored to be degenerative. Electronically Signed   By: Gerome Sam III M.D   On: 06/25/2016 14:44    Procedures .[name] KitchenLaceration Repair Date/Time: 06/25/2016 3:03 PM Performed by: Laurence Spates Authorized by: Laurence Spates   Consent:    Consent obtained:  Verbal   Consent given by:  Patient Anesthesia (see MAR for exact dosages):    Anesthesia method:  Local infiltration   Local anesthetic:  Lidocaine 1% WITH epi Laceration details:    Location:  Face   Face location:  Forehead   Length (cm):  3   Depth (mm):  5 Repair type:    Repair type:  Intermediate Pre-procedure details:    Preparation:  Patient was  prepped and draped in usual sterile fashion Treatment:    Area cleansed with:  Betadine   Amount of cleaning:  Standard   Irrigation solution:  Sterile saline   Irrigation volume:  150   Irrigation method:  Pressure wash Mucous membrane repair:    Suture size:  6-0   Suture material:  Vicryl   Number of sutures:  2 Skin repair:    Repair method:  Sutures   Suture size:  6-0   Suture material:  Fast-absorbing gut   Number of sutures:  8 Approximation:    Approximation:  Close Post-procedure details:    Dressing:  Antibiotic ointment   Patient tolerance of procedure:  Tolerated well, no immediate complications   (including critical care time)  Medications Ordered in ED Medications  lidocaine-EPINEPHrine (XYLOCAINE W/EPI) 1 %-1:100000 (with pres) injection 10 mL (not administered)  lidocaine-EPINEPHrine (XYLOCAINE-EPINEPHrine) 1 %-1:200000 (PF) injection (not administered)     Initial Impression / Assessment and Plan / ED Course  I have reviewed the triage vital signs and the nursing notes.  Pertinent imaging results that were available during my care of the patient were reviewed by me and considered in my medical decision making (see chart for details).  Clinical Course   PT w/ forehead laceration and L Periorbital ecchymosis and swelling after falling forward and striking her head today. She was awake and alert, neurologically intact on exam. Obtained CT of head, C-spine, and face to rule out acute injury.  Repaired laceration at bedside, see procedure note for details.All head imaging was negative for acute injury. Discussed supportive care for her sutures and facial swelling as well as reviewed return precautions regarding any signs of infection. Patient voiced understanding and was discharged in satisfactory condition.  Final Clinical Impressions(s) / ED Diagnoses   Final diagnoses:  Minor head injury, initial encounter  Forehead laceration, initial encounter  New  Prescriptions New Prescriptions   No medications on file     Laurence Spates, MD 06/25/16 1505

## 2016-06-25 NOTE — ED Triage Notes (Signed)
Pt tripped over a wire in [employer] approx 2 hours PTA-pain to back of head, forehead, swelling bruising noted around left eye-denies LOC-pain to both knees-pt walking with own cane-NAD

## 2016-06-28 ENCOUNTER — Other Ambulatory Visit: Payer: Self-pay | Admitting: Cardiology

## 2016-06-28 DIAGNOSIS — R918 Other nonspecific abnormal finding of lung field: Secondary | ICD-10-CM

## 2016-06-28 DIAGNOSIS — R0789 Other chest pain: Secondary | ICD-10-CM

## 2016-06-28 DIAGNOSIS — J189 Pneumonia, unspecified organism: Secondary | ICD-10-CM

## 2016-06-28 DIAGNOSIS — I10 Essential (primary) hypertension: Secondary | ICD-10-CM

## 2016-06-28 MED ORDER — AMLODIPINE BESYLATE 5 MG PO TABS: 5.0000 mg | ORAL_TABLET | Freq: Every day | ORAL | 0 refills | Status: DC

## 2016-07-05 ENCOUNTER — Ambulatory Visit (INDEPENDENT_AMBULATORY_CARE_PROVIDER_SITE_OTHER): Payer: Medicare Other | Admitting: Physician Assistant

## 2016-07-05 ENCOUNTER — Encounter: Payer: Self-pay | Admitting: Physician Assistant

## 2016-07-05 VITALS — BP 162/102 | HR 76 | Ht 65.0 in | Wt 194.2 lb

## 2016-07-05 DIAGNOSIS — R609 Edema, unspecified: Secondary | ICD-10-CM | POA: Diagnosis not present

## 2016-07-05 DIAGNOSIS — I10 Essential (primary) hypertension: Secondary | ICD-10-CM | POA: Diagnosis not present

## 2016-07-05 DIAGNOSIS — E785 Hyperlipidemia, unspecified: Secondary | ICD-10-CM | POA: Diagnosis not present

## 2016-07-05 LAB — CBC
HCT: 39.5 % (ref 35.0–45.0)
Hemoglobin: 13 g/dL (ref 11.7–15.5)
MCH: 30 pg (ref 27.0–33.0)
MCHC: 32.9 g/dL (ref 32.0–36.0)
MCV: 91 fL (ref 80.0–100.0)
MPV: 9.5 fL (ref 7.5–12.5)
PLATELETS: 117 10*3/uL — AB (ref 140–400)
RBC: 4.34 MIL/uL (ref 3.80–5.10)
RDW: 13.2 % (ref 11.0–15.0)
WBC: 4 10*3/uL (ref 3.8–10.8)

## 2016-07-05 LAB — BASIC METABOLIC PANEL
BUN: 15 mg/dL (ref 7–25)
CHLORIDE: 104 mmol/L (ref 98–110)
CO2: 31 mmol/L (ref 20–31)
CREATININE: 0.89 mg/dL (ref 0.50–0.99)
Calcium: 9.3 mg/dL (ref 8.6–10.4)
Glucose, Bld: 87 mg/dL (ref 65–99)
POTASSIUM: 4.7 mmol/L (ref 3.5–5.3)
SODIUM: 141 mmol/L (ref 135–146)

## 2016-07-05 MED ORDER — POTASSIUM CHLORIDE CRYS ER 20 MEQ PO TBCR: 20.0000 meq | EXTENDED_RELEASE_TABLET | Freq: Every day | ORAL | 3 refills | Status: DC | PRN

## 2016-07-05 MED ORDER — AMLODIPINE BESYLATE 5 MG PO TABS: 5.0000 mg | ORAL_TABLET | Freq: Every day | ORAL | 3 refills | Status: DC

## 2016-07-05 MED ORDER — FUROSEMIDE 40 MG PO TABS: 40.0000 mg | ORAL_TABLET | Freq: Every day | ORAL | 3 refills | Status: DC | PRN

## 2016-07-05 NOTE — Progress Notes (Signed)
Cardiology Office Note    Date:  07/05/2016   ID:  Molly, Fischer 11-26-1945, MRN [id number]  PCP:  Donato Schultz, DO  Cardiologist: Dr. Delton See  Chief Complaint  Patient presents with  . Follow-up    History of Present Illness:  [name] is a 70 y.o. female history of hypertension, and obesity, HLD. She last saw Dr. Delton See 01/2015 for evaluation of lower extremity edema. She was initially given furosemide with significant improvement. LE edema resolved with discontinuation of Adderal. Lower extremity edema return in 01/2015 and she was started on Lasix. She was also complaining of shortness of breath at rest and on exertion which resolved and she had normal PFTs.  2-D echo 03/07/15 normal systolic function LVEF 55-60% grade 1 DD.  Ran out of her Amlodipine and Lasix 3 weeks ago.Had to cancel several appointments because her husband had emergency heart surgery and she's had other issues come up. BP very high. Some shortness of breath she thinks is related to back pain and anxiety. She has her granddaughter and great-granddaughter living with her known is under a lot of stress. She denies chest pain, palpitations, dizziness or presyncope. Her swelling isn't bad today but by 1:00 she says her legs will be swollen. She doesn't follow a 2 g sodium diet very closely in the either out a lot. She tripped and fell 2 weeks ago while shopping and had a laceration above her left eye that was repaired in the emergency room. She still has bruising on the left side of her face.  Past Medical History:  Diagnosis Date  . ADD (attention deficit disorder) 05/24/2012  . ANEMIA, MILD 06/26/2008   Qualifier: Diagnosis of  By: Janit Bern    . Anxiety   . Arthritis    "joints, back" (10/02/2013)  . Chronic back pain    "all over my back" (10/02/2013)  . Congestive heart disease (HCC)    "I have the beginning signs" (10/02/2013)  . Crushing injury of arm, right 02/06/1989's   "it was  crushed; wore cast from fingers to top of my shoulder" (11/25/2  . Crushing injury of left wrist 05/11/1989's  . DIZZINESS 12/31/2009   Qualifier: Diagnosis of  By: Oswald Hillock    . Fibromyalgia   . GERD 12/03/2008   Qualifier: Diagnosis of  By: Freddy Jaksch    . HYPERLIPIDEMIA 05/15/2007   Qualifier: Diagnosis of  By: Janit Bern ; pt denies this hx on 10/02/2013  . Hypertension   . Idiopathic scoliosis 04/19/2013  . Migraines    "once/month" (10/02/2013)  . Osteoporosis, unspecified 04/19/2013  . Other vitamin B12 deficiency anemia 10/30/2009   Qualifier: Diagnosis of  By: Floydene Flock    . PALPITATIONS, RECURRENT 12/12/2009   Qualifier: Diagnosis of  By: Janit Bern    . Pica 12/28/2007   Qualifier: Diagnosis of  By: Janit Bern    . Pneumonia 10/02/2013   "had onsets of it before; not full blown til now" (10/02/2013)  . PSTPRC STATUS, BARIATRIC SURGERY 05/15/2007   Qualifier: Diagnosis of  By: Janit Bern    . Pulmonary nodules/lesions, multiple   . TREMOR, ESSENTIAL 12/12/2009   Qualifier: Diagnosis of  By: Janit Bern    . Unspecified Myalgia and Myositis 05/15/2007   Centricity Description: FIBROMYALGIA Qualifier: Diagnosis of  By: Janit Bern   Centricity Description: MYALGIA Qualifier: Diagnosis of  By: Janit Bern  Past Surgical History:  Procedure Laterality Date  . ABDOMINAL HYSTERECTOMY  1980's  . APPENDECTOMY  1980's   "w/either my hysterectomy or myomectomy" (10/02/2013)  . BACK SURGERY    . CLOSED REDUCTION HAND FRACTURE Right 1990's   "it was crushed; wore cast from fingers to top of my shoulder" (10/02/2013)  . GASTRIC BYPASS  ~ 2003  . KNEE ARTHROSCOPY Right 2002  . MYOMECTOMY  1980's  . REDUCTION MAMMAPLASTY Bilateral 2001  . REFRACTIVE SURGERY Bilateral 2004  . SPINAL CORD STIMULATOR IMPLANT    . TONSILLECTOMY AND ADENOIDECTOMY  1955  . TUBAL LIGATION  1980's    Current Medications: Outpatient Medications Prior  to Visit  Medication Sig Dispense Refill  . amitriptyline (ELAVIL) 75 MG tablet Take 1 tablet by mouth daily.    [name] aspirin 81 MG tablet Take 81 mg by mouth daily.    . clonazePAM (KLONOPIN) 0.5 MG tablet TAKE ONE (1) TABLET BY MOUTH 3 TIMES DAILY AS NEEDED FOR ANXIETY 90 tablet 2  . cyclobenzaprine (FLEXERIL) 10 MG tablet Take 10 mg by mouth 3 (three) times daily as needed for muscle spasms.    [name] lisdexamfetamine (VYVANSE) 30 MG capsule Take 1 capsule (30 mg total) by mouth daily. 30 capsule 0  . meloxicam (MOBIC) 15 MG tablet Take 1 tablet by mouth daily.    . Oxycodone HCl 20 MG TABS Take 1 tablet by mouth 4 (four) times daily as needed.    . SUMAtriptan (IMITREX) 50 MG tablet Take 50 mg by mouth every 2 (two) hours as needed for migraine. May repeat in 2 hours if headache persists or recurs.    . Teriparatide, Recombinant, (FORTEO) 600 MCG/2.4ML SOLN Inject 0.08 mLs (20 mcg total) into the skin daily. 2.4 mL 11  . venlafaxine XR (EFFEXOR-XR) 75 MG 24 hr capsule Take 3 capsules (225 mg total) by mouth daily. 90 capsule 5  . amLODipine (NORVASC) 5 MG tablet Take 1 tablet (5 mg total) by mouth daily. 30 tablet 0  . furosemide (LASIX) 40 MG tablet Take 1 tablet (40 mg total) by mouth daily. 90 tablet 3  . oxymorphone (OPANA ER) 15 MG 12 hr tablet Take 1 tablet by mouth 2 (two) times daily.    . SUMAtriptan (IMITREX) 50 MG tablet TAKE 1 TABLET AT ONSET OF HEADACHE. MAY REPEAT IN 2 HOURS IF NEEDED 10 tablet 5   No facility-administered medications prior to visit.      Allergies:   Review of patient's allergies indicates no known allergies.   Social History   Social History  . Marital status: Married    Spouse name: N/A  . Number of children: 2  . Years of education: N/A   Occupational History  . Retired    Social History Main Topics  . Smoking status: Never Smoker  . Smokeless tobacco: Never Used  . Alcohol use No  . Drug use: No  . Sexual activity: No   Other Topics Concern  .  None   Social History Narrative  . None     Family History:  The patient's   family history includes COPD in her mother; Emphysema in her mother; Heart attack in her mother; Other in her mother. She was adopted.   ROS:   Please see the history of present illness.    Review of Systems  Constitution: Positive for malaise/fatigue.  HENT: Negative.   Eyes: Negative.   Cardiovascular: Positive for dyspnea on exertion and leg swelling.  Respiratory: Negative.  Hematologic/Lymphatic: Negative.   Musculoskeletal: Positive for back pain. Negative for joint pain.  Gastrointestinal: Positive for abdominal pain.  Genitourinary: Negative.   Neurological: Positive for loss of balance.   All other systems reviewed and are negative.   PHYSICAL EXAM:   VS:  BP (!) 162/102   Pulse 76   Ht 5\' 5"  (1.651 m)   Wt 194 lb 3.2 oz (88.1 kg)   SpO2 98%   BMI 32.32 kg/m   Physical Exam  GEN: Well nourished, well developed, in no acute distress, Bruising on her left cheek   Neck: no JVD, carotid bruits, or masses Cardiac:RRR; no murmurs, rubs, or gallops  Respiratory:  Decreased breath sounds but clear to auscultation bilaterally, normal work of breathing GI: soft, nontender, nondistended, + BS Ext: without cyanosis, clubbing, or edema, Good distal pulses bilaterally MS: no deformity or atrophy  Skin: warm and dry, no rash Psych: euthymic mood, full affect  Wt Readings from Last 3 Encounters:  07/05/16 194 lb 3.2 oz (88.1 kg)  06/25/16 195 lb (88.5 kg)  03/22/16 191 lb 3.2 oz (86.7 kg)      Studies/Labs Reviewed:   EKG:  EKG is ordered today.  The ekg ordered today demonstrates Normal sinus rhythm, no acute change  Recent Labs: No results found for requested labs within last 8760 hours.   Lipid Panel    Component Value Date/Time   CHOL 182 10/28/2009 0000   TRIG 155.0 (H) 10/28/2009 0000   HDL 61.30 10/28/2009 0000   CHOLHDL 3 10/28/2009 0000   VLDL 31.0 10/28/2009 0000   LDLCALC  90 10/28/2009 0000   LDLDIRECT 129.1 07/17/2007 0840    Additional studies/ records that were reviewed today include:  2-D echo 4/29/16Study Conclusions   - Left ventricle: The cavity size was normal. Wall thickness was   increased in a pattern of mild LVH. Systolic function was normal.   The estimated ejection fraction was in the range of 55% to 60%.   Wall motion was normal; there were no regional wall motion   abnormalities. Doppler parameters are consistent with abnormal   left ventricular relaxation (grade 1 diastolic dysfunction). The   E/e&' ratio is <8, suggesting normal LV filling pressure. - Mitral valve: Calcified annulus. There was trivial regurgitation. - Left atrium: The atrium was normal in size. - Tricuspid valve: There was trivial regurgitation. - Pulmonary arteries: PA peak pressure: 30 mm Hg (S). - Inferior vena cava: The vessel was normal in size. The   respirophasic diameter changes were in the normal range (>= 50%),   consistent with normal central venous pressure.   Impressions:   - Compared to the prior echo in 12/2012, there has been no   significant change.     ASSESSMENT:    1. Essential hypertension   2. Edema, unspecified type   3. Hyperlipidemia      PLAN:  In order of problems listed above:  Essential hypertension: Patient's blood pressure is quite high today as she ran out of her medications 3 weeks ago. We'll resume amlodipine 5 mg daily, Lasix 40 mg when necessary, potassium 20 mEq when necessary. She is to take her blood pressure daily for 1 week and call us with results I will see her back in 2 weeks. Follow-up with Dr. Delton SeeNelson in 1 year.   Edema refill Lasix when necessary. Check labs today as well.  Hyperlipidemia patient states she has no history of hyperlipidemia but it is in her diagnosis. She ate  today so we cannot check her lipid panel. She will need this checked in the future.   Medication Adjustments/Labs and Tests  Ordered: Current medicines are reviewed at length with the patient today.  Concerns regarding medicines are outlined above.  Medication changes, Labs and Tests ordered today are listed in the Patient Instructions below. Patient Instructions  Medication Instructions:  START Potassium 20 meq as needed when taking Lasix  Labwork: CBC and BMP  Testing/Procedures: None Ordered  Follow-Up: Your physician recommends that you schedule a follow-up appointment in: 2 weeks with Jacolyn Reedy  Your physician wants you to follow-up in: 1 Year with Dr Delton See. You will receive a reminder letter in the mail two months in advance. If you don't receive a letter, please call our office to schedule the follow-up appointment.   Any Other Special Instructions Will Be Listed Below (If Applicable).   If you need a refill on your cardiac medications before your next appointment, please call your pharmacy.      Elson Clan, PA-C  07/05/2016 9:26 AM    [facility] [address] [place], [place], [place]  [address] Phone: [phone]; Fax: [phone]

## 2016-07-05 NOTE — Patient Instructions (Signed)
Medication Instructions:  START Potassium 20 meq as needed when taking Lasix  Labwork: CBC and BMP  Testing/Procedures: None Ordered  Follow-Up: Your physician recommends that you schedule a follow-up appointment in: 2 weeks with Jacolyn ReedyMichele Lenze  Your physician wants you to follow-up in: 1 Year with Dr Delton SeeNelson. You will receive a reminder letter in the mail two months in advance. If you don't receive a letter, please call our office to schedule the follow-up appointment.   Any Other Special Instructions Will Be Listed Below (If Applicable).   If you need a refill on your cardiac medications before your next appointment, please call your pharmacy.

## 2016-07-05 NOTE — Addendum Note (Signed)
Addended by: Tonita PhoenixBOWDEN, ROBIN K on: 07/05/2016 09:44 AM   Modules accepted: Orders

## 2016-07-05 NOTE — Addendum Note (Signed)
Addended by: Barrie DunkerHOMAS, Cordney Barstow N on: 07/05/2016 09:44 AM   Modules accepted: Orders

## 2016-07-07 ENCOUNTER — Telehealth: Payer: Self-pay | Admitting: Family Medicine

## 2016-07-07 DIAGNOSIS — F988 Other specified behavioral and emotional disorders with onset usually occurring in childhood and adolescence: Secondary | ICD-10-CM

## 2016-07-07 NOTE — Telephone Encounter (Signed)
°  Relationship to patient: Self Can be reached: [phone]    Reason for call: Request refill on lisdexamfetamine (VYVANSE) 30 MG capsule [[id number]][[id number]]

## 2016-07-07 NOTE — Telephone Encounter (Signed)
Refill x1 

## 2016-07-07 NOTE — Telephone Encounter (Signed)
Last OV 03/22/16 & filled 06/01/16 #30 UDS 03/06/15  Please advise. Thanks.

## 2016-07-08 ENCOUNTER — Ambulatory Visit (INDEPENDENT_AMBULATORY_CARE_PROVIDER_SITE_OTHER): Payer: Medicare Other | Admitting: Medical

## 2016-07-08 ENCOUNTER — Encounter: Payer: Self-pay | Admitting: Physician Assistant

## 2016-07-08 VITALS — BP 145/90 | HR 87 | Temp 98.5°F | Ht 65.0 in | Wt 191.0 lb

## 2016-07-08 DIAGNOSIS — F988 Other specified behavioral and emotional disorders with onset usually occurring in childhood and adolescence: Secondary | ICD-10-CM

## 2016-07-08 DIAGNOSIS — F909 Attention-deficit hyperactivity disorder, unspecified type: Secondary | ICD-10-CM | POA: Diagnosis not present

## 2016-07-08 DIAGNOSIS — Z5189 Encounter for other specified aftercare: Secondary | ICD-10-CM | POA: Diagnosis not present

## 2016-07-08 MED ORDER — LISDEXAMFETAMINE DIMESYLATE 30 MG PO CAPS: 30.0000 mg | ORAL_CAPSULE | Freq: Every day | ORAL | 0 refills | Status: DC

## 2016-07-08 NOTE — Progress Notes (Signed)
Pre visit review using our clinic tool,if applicable. No additional management support is needed unless otherwise documented below in the visit note.  

## 2016-07-08 NOTE — Patient Instructions (Signed)
For your ADD. I refilled your vyvanse since pcp not in. When you run out on contact pcp   For your buried sutures I discussed with Dr. Abner GreenspanBlyth and she agrees that sutures may cause eventual infection. She recommends referral to plastic surgeon for evaluation on how to proceed.(at least discuss pros and cons).  I will put in referral and if no call by Tuesday of next week then ask you contact Victorino DikeJennifer in our office on visit to plastic surgeon.

## 2016-07-08 NOTE — Progress Notes (Signed)
Subjective:    Patient ID: [name], female    DOB: 11/18/1945, 70 y.o.   MRN: [id number]  HPI  Pt in for left eye brow laceration follow up 2 weeks ago.She had 10 sutures placed. She is in today and states sutures buried completely and does not want procedure done to remove the sutures.  Pt suture material were vicryl and gut.   Pt has ADD and dx as adult. Been on med for 4 years. This has helped. No side effects and reports just works. She get refills routinely per pt. Looks like Dr. Laury AxonLowne authorization of vyvanse(on review of note). But staff printed rx she is not in. So I will shred that rx and write new one. So my prescribing information is on script.      Review of Systems  Constitutional: Negative for chills and fatigue.  Respiratory: Negative for cough, choking, shortness of breath and wheezing.   Cardiovascular: Negative for chest pain and palpitations.  Gastrointestinal: Negative for abdominal pain.  Skin: Negative for rash.       Laceration on left eye brow healed.  Neurological: Negative for dizziness and headaches.  Hematological: Negative for adenopathy. Does not bruise/bleed easily.  Psychiatric/Behavioral: Positive for decreased concentration. Negative for behavioral problems, confusion, hallucinations and sleep disturbance. The patient is not nervous/anxious.        But better with medications.    Past Medical History:  Diagnosis Date  . ADD (attention deficit disorder) 05/24/2012  . ANEMIA, MILD 06/26/2008   Qualifier: Diagnosis of  By: Janit BernLowne DO, Yvonne    . Anxiety   . Arthritis    "joints, back" (10/02/2013)  . Chronic back pain    "all over my back" (10/02/2013)  . Congestive heart disease (HCC)    "I have the beginning signs" (10/02/2013)  . Crushing injury of arm, right 02/06/1989's   "it was crushed; wore cast from fingers to top of my shoulder" (11/25/2  . Crushing injury of left wrist 05/11/1989's  . DIZZINESS 12/31/2009   Qualifier: Diagnosis of   By: Oswald HillockGraham-Oliver, Karen    . Fibromyalgia   . GERD 12/03/2008   Qualifier: Diagnosis of  By: Freddy JakschHooks, Phyllis    . HYPERLIPIDEMIA 05/15/2007   Qualifier: Diagnosis of  By: Janit BernLowne DO, Yvonne ; pt denies this hx on 10/02/2013  . Hypertension   . Idiopathic scoliosis 04/19/2013  . Migraines    "once/month" (10/02/2013)  . Osteoporosis, unspecified 04/19/2013  . Other vitamin B12 deficiency anemia 10/30/2009   Qualifier: Diagnosis of  By: Floydene FlockHoops, Regina    . PALPITATIONS, RECURRENT 12/12/2009   Qualifier: Diagnosis of  By: Janit BernLowne DO, Yvonne    . Pica 12/28/2007   Qualifier: Diagnosis of  By: Janit BernLowne DO, Yvonne    . Pneumonia 10/02/2013   "had onsets of it before; not full blown til now" (10/02/2013)  . PSTPRC STATUS, BARIATRIC SURGERY 05/15/2007   Qualifier: Diagnosis of  By: Janit BernLowne DO, Yvonne    . Pulmonary nodules/lesions, multiple   . TREMOR, ESSENTIAL 12/12/2009   Qualifier: Diagnosis of  By: Janit BernLowne DO, Yvonne    . Unspecified Myalgia and Myositis 05/15/2007   Centricity Description: FIBROMYALGIA Qualifier: Diagnosis of  By: Janit BernLowne DO, Yvonne   Centricity Description: MYALGIA Qualifier: Diagnosis of  By: Janit BernLowne DO, Yvonne       Social History   Social History  . Marital status: Married    Spouse name: N/A  . Number of children: 2  . Years of education:  N/A   Occupational History  . Retired    Social History Main Topics  . Smoking status: Never Smoker  . Smokeless tobacco: Never Used  . Alcohol use No  . Drug use: No  . Sexual activity: No   Other Topics Concern  . Not on file   Social History Narrative  . No narrative on file    Past Surgical History:  Procedure Laterality Date  . ABDOMINAL HYSTERECTOMY  1980's  . APPENDECTOMY  1980's   "w/either my hysterectomy or myomectomy" (10/02/2013)  . BACK SURGERY    . CLOSED REDUCTION HAND FRACTURE Right 1990's   "it was crushed; wore cast from fingers to top of my shoulder" (10/02/2013)  . GASTRIC BYPASS  ~ 2003  . KNEE ARTHROSCOPY  Right 2002  . MYOMECTOMY  1980's  . REDUCTION MAMMAPLASTY Bilateral 2001  . REFRACTIVE SURGERY Bilateral 2004  . SPINAL CORD STIMULATOR IMPLANT    . TONSILLECTOMY AND ADENOIDECTOMY  1955  . TUBAL LIGATION  1980's    Family History  Problem Relation Age of Onset  . Adopted: Yes  . COPD Mother   . Emphysema Mother   . Heart attack Mother   . Other Mother     tobacco abuse    No Known Allergies  Current Outpatient Prescriptions on File Prior to Visit  Medication Sig Dispense Refill  . amitriptyline (ELAVIL) 75 MG tablet Take 1 tablet by mouth daily.    [name] amLODipine (NORVASC) 5 MG tablet Take 1 tablet (5 mg total) by mouth daily. 90 tablet 3  . aspirin 81 MG tablet Take 81 mg by mouth daily.    . clonazePAM (KLONOPIN) 0.5 MG tablet TAKE ONE (1) TABLET BY MOUTH 3 TIMES DAILY AS NEEDED FOR ANXIETY 90 tablet 2  . cyclobenzaprine (FLEXERIL) 10 MG tablet Take 10 mg by mouth 3 (three) times daily as needed for muscle spasms.    . furosemide (LASIX) 40 MG tablet Take 1 tablet (40 mg total) by mouth daily as needed. 90 tablet 3  . meloxicam (MOBIC) 15 MG tablet Take 1 tablet by mouth daily.    [name] morphine (MSIR) 15 MG tablet Take 15 mg by mouth every 12 (twelve) hours as needed for severe pain (for pain).    . Oxycodone HCl 20 MG TABS Take 1 tablet by mouth 4 (four) times daily as needed.    . potassium chloride SA (KLOR-CON M20) 20 MEQ tablet Take 1 tablet (20 mEq total) by mouth daily as needed. 90 tablet 3  . SUMAtriptan (IMITREX) 50 MG tablet Take 50 mg by mouth every 2 (two) hours as needed for migraine. May repeat in 2 hours if headache persists or recurs.    . venlafaxine XR (EFFEXOR-XR) 75 MG 24 hr capsule Take 3 capsules (225 mg total) by mouth daily. 90 capsule 5  . Teriparatide, Recombinant, (FORTEO) 600 MCG/2.4ML SOLN Inject 0.08 mLs (20 mcg total) into the skin daily. (Patient not taking: Reported on 07/08/2016) 2.4 mL 11   No current facility-administered medications on file  prior to visit.     BP (!) 145/90   Pulse 87   Temp 98.5 F (36.9 C) (Oral)   Ht 5\' 5"  (1.651 m)   Wt 191 lb (86.6 kg)   SpO2 96%   BMI 31.78 kg/m       Objective:   Physical Exam  General- No acute distress. Pleasant patient.  Lungs- Clear, even and unlabored. Heart- regular rate and rhythm. Neurologic- CNII- XII  grossly intact.  Skin- left eye brow laceration has healed well. On close inspection I don't see any portion of sutures at all.        Assessment & Plan:  For your ADD. I refilled your vyvanse since pcp not in. When you run out on contact pcp   For your buried sutures I discussed with Dr. Abner Greenspan and she agrees that sutures may cause eventual infection. She recommends referral to plastic surgeon for evaluation on how to proceed.(at least discuss pros and cons).  I will put in referral and if no call by Tuesday of next week then ask you contact Victorino Dike in our office on visit to plastic surgeon.  Note I talked with Victorino Dike and asked her to make copy of ED note which states vicryl and absorbable suture material used. So I asked her to inform plastic surgeon  of clinical scenario. Get her in by Tuesday next week or sooner. Discussed case with Dr. Abner Greenspan.

## 2016-07-08 NOTE — Telephone Encounter (Signed)
Rx printed, to PCP for signature. 

## 2016-07-09 ENCOUNTER — Other Ambulatory Visit: Payer: Self-pay | Admitting: Family Medicine

## 2016-07-09 DIAGNOSIS — F411 Generalized anxiety disorder: Secondary | ICD-10-CM

## 2016-07-09 NOTE — Telephone Encounter (Signed)
Pt called in to request a Rx for her clonazePAM   Pharmacy: [facility] - [place], [place] - [address]

## 2016-07-13 MED ORDER — CLONAZEPAM 0.5 MG PO TABS: ORAL_TABLET | ORAL | 2 refills | Status: DC

## 2016-07-13 NOTE — Telephone Encounter (Signed)
Rx faxed.    KP 

## 2016-07-13 NOTE — Telephone Encounter (Signed)
last seen and filled 03/22/16 #90 with 2 refills.   Please advise    KP

## 2016-07-26 ENCOUNTER — Ambulatory Visit: Payer: Medicare Other | Admitting: Physician Assistant

## 2016-07-29 ENCOUNTER — Telehealth: Payer: Self-pay | Admitting: Cardiology

## 2016-07-29 NOTE — Telephone Encounter (Signed)
[name] is wanting to join [facility] for [facility] and Pul[facility] rehab , and asking that you call this [phone] and [facility] and let them no that she is wanting to join the program. She feels that it would do her some good . Please call

## 2016-07-29 NOTE — Telephone Encounter (Signed)
Informed the pt that I left a detailed message on Jasmine at [facility]'s confirmed VM, that she should fax this referral to Dr Delton SeeNelson and myself at [phone].  Informed the pt that I left her a message that once she faxes this referral, I will have Dr Delton SeeNelson fill this out accordingly and fax this back for them to schedule the pt for their program.  Pt states she chose to attend their rehab services, for this will be an easier commute for the pt.  Pt verbalized understanding, agrees with this plan, and gracious for all the assistance provided.

## 2016-07-30 NOTE — Telephone Encounter (Signed)
Left another message for [facility]Jasmine [facility] at [facility], to fax the pts referral form to our office at [phone], for Dr  Delton SeeNelson to review and advise on.  We will fax this back to their facility accordingly, when received.

## 2016-08-02 NOTE — Telephone Encounter (Signed)
Received this pts cardiac re[facility] for Dr Delton SeeNelson to review and advise on.  Dr Delton SeeNelson reviewed and signed cardiac referral accordingly, and this was [facility].

## 2016-08-05 ENCOUNTER — Encounter: Payer: Self-pay | Admitting: Cardiology

## 2016-08-05 ENCOUNTER — Ambulatory Visit (INDEPENDENT_AMBULATORY_CARE_PROVIDER_SITE_OTHER): Payer: Medicare Other | Admitting: Cardiology

## 2016-08-05 VITALS — BP 140/80 | HR 100 | Ht 65.0 in | Wt 202.8 lb

## 2016-08-05 DIAGNOSIS — I10 Essential (primary) hypertension: Secondary | ICD-10-CM

## 2016-08-05 DIAGNOSIS — R0609 Other forms of dyspnea: Secondary | ICD-10-CM | POA: Diagnosis not present

## 2016-08-05 MED ORDER — AMLODIPINE BESYLATE 5 MG PO TABS: 5.0000 mg | ORAL_TABLET | Freq: Every day | ORAL | 3 refills | Status: DC

## 2016-08-05 NOTE — Patient Instructions (Addendum)
Medication Instructions:   Your physician recommends that you continue on your current medications as directed. Please refer to the Current Medication list given to you today.    If you need a refill on your cardiac medications before your next appointment, please call your pharmacy.  Labwork: NONE ORDER TODAY    Testing/Procedures:  Your physician has requested that you have a lexiscan myoview. For further information please visit [email or web address]. Please follow instruction sheet, as given.  Your physician has requested that you have an echocardiogram. Echocardiography is a painless test that uses sound waves to create images of your heart. It provides your doctor with information about the size and shape of your heart and how well your heart's chambers and valves are working. This procedure takes approximately one hour. There are no restrictions for this procedure.   Follow-Up: IN 2 TO 3 WEEKS  WITH  BRITTANY SIMMONS PA-C   Any Other Special Instructions Will Be Listed Below (If Applicable).

## 2016-08-05 NOTE — Progress Notes (Signed)
08/05/2016 [name]   03-26-1946  [id number]  Primary Physician Donato Schultz, DO Primary Cardiologist: Dr. Delton See    Reason for Visit/CC: HTN  F/U; exertional dyspnea   HPI:  [name] is a 70 y.o. female history of hypertension, and obesity and HLD. She is followed by Dr. Delton See.  She last saw Dr. Delton See 01/2015 for evaluation of lower extremity edema. She was initially given furosemide with significant improvement. LE edema resolved with discontinuation of Adderal. Lower extremity edema return in 01/2015 and she was started back on Lasix. She was also complaining of shortness of breath at rest and on exertion which resolved and she had normal PFTs.  2-D echo 03/07/15 normal systolic function LVEF 55-60% grade 1 DD.  She was seen by Herma Carson, PA-C, 2 weeks ago for f/u. Her BP was high at 162/102. Patient noted she had ran out of her amlodipine and lasix.  Was out of meds x 3 weeks. She was also under a great deal of stress. Her husband was recently hospitalized and had to undergo emergency heart surgery. She had also sustained a mechanical fall and suffered a head laceration and was treated in the ED.  She was still in a moderate amount of pain at that time.  Also with her grand- daughter and great grandaughter living with her. Very stressed out with anxiety as well.  Her amlodipine was reordered, as well as her lasix PRN.   She presents back to clinic today. Blood pressure has improved to 140/80. She reports full compliance with all her medications. She denies chest pain but she reports increasing exertional dyspnea that has been worsening over the past several months. She denies any resting dyspnea. Her husband has concerns, given she was told in the past that she had a leaky heart valve. She denies any history of tobacco abuse. No wheezing or cough. She  denies associated chest discomfort. No lightheadedness, dizziness, syncope/ near syncope. She has not had a stress test  in recent years. Her last echocardiogram was in April 2016. EF was normal at 55-60%. Trivial MR was noted at that time. She had a CBC 1 month ago that showed stable hgb at 13.   She also reports issues with worsening chronic low back pain with radiation of sharp shooting pain down her left lower extremity. She denies any symptoms of claudication. She has  2+ distal pulses bilaterally. Symptoms seem more consistent with sciatica.   Current Meds  Medication Sig  . amitriptyline (ELAVIL) 75 MG tablet Take 1 tablet by mouth daily.  [name] amLODipine (NORVASC) 5 MG tablet Take 1 tablet (5 mg total) by mouth daily.  [name] aspirin 81 MG tablet Take 81 mg by mouth daily.  . clonazePAM (KLONOPIN) 0.5 MG tablet TAKE ONE (1) TABLET BY MOUTH 3 TIMES DAILY AS NEEDED FOR ANXIETY  . cyclobenzaprine (FLEXERIL) 10 MG tablet Take 10 mg by mouth 3 (three) times daily as needed for muscle spasms.  . furosemide (LASIX) 40 MG tablet Take 40 mg by mouth daily as needed for edema.  [name] lisdexamfetamine (VYVANSE) 30 MG capsule Take 1 capsule (30 mg total) by mouth daily.  . meloxicam (MOBIC) 15 MG tablet Take 1 tablet by mouth daily.  [name] morphine (MSIR) 15 MG tablet Take 15 mg by mouth every 12 (twelve) hours as needed for severe pain (for pain).  . Oxycodone HCl 20 MG TABS Take 1 tablet by mouth 4 (four) times daily as needed (pain).   [name]  potassium chloride SA (K-DUR,KLOR-CON) 20 MEQ tablet Take 20 mEq by mouth daily as needed. When taking lasix  . SUMAtriptan (IMITREX) 50 MG tablet Take 50 mg by mouth every 2 (two) hours as needed for migraine. May repeat in 2 hours if headache persists or recurs.  . venlafaxine XR (EFFEXOR-XR) 75 MG 24 hr capsule Take 3 capsules (225 mg total) by mouth daily.  . [DISCONTINUED] amLODipine (NORVASC) 5 MG tablet Take 1 tablet (5 mg total) by mouth daily.   No Known Allergies Past Medical History:  Diagnosis Date  . ADD (attention deficit disorder) 05/24/2012  . ANEMIA, MILD 06/26/2008    Qualifier: Diagnosis of  By: Janit Bern    . Anxiety   . Arthritis    "joints, back" (10/02/2013)  . Chronic back pain    "all over my back" (10/02/2013)  . Congestive heart disease (HCC)    "I have the beginning signs" (10/02/2013)  . Crushing injury of arm, right 02/06/1989's   "it was crushed; wore cast from fingers to top of my shoulder" (11/25/2  . Crushing injury of left wrist 05/11/1989's  . DIZZINESS 12/31/2009   Qualifier: Diagnosis of  By: Oswald Hillock    . Fibromyalgia   . GERD 12/03/2008   Qualifier: Diagnosis of  By: Freddy Jaksch    . HYPERLIPIDEMIA 05/15/2007   Qualifier: Diagnosis of  By: Janit Bern ; pt denies this hx on 10/02/2013  . Hypertension   . Idiopathic scoliosis 04/19/2013  . Migraines    "once/month" (10/02/2013)  . Osteoporosis, unspecified 04/19/2013  . Other vitamin B12 deficiency anemia 10/30/2009   Qualifier: Diagnosis of  By: Floydene Flock    . PALPITATIONS, RECURRENT 12/12/2009   Qualifier: Diagnosis of  By: Janit Bern    . Pica 12/28/2007   Qualifier: Diagnosis of  By: Janit Bern    . Pneumonia 10/02/2013   "had onsets of it before; not full blown til now" (10/02/2013)  . PSTPRC STATUS, BARIATRIC SURGERY 05/15/2007   Qualifier: Diagnosis of  By: Janit Bern    . Pulmonary nodules/lesions, multiple   . TREMOR, ESSENTIAL 12/12/2009   Qualifier: Diagnosis of  By: Janit Bern    . Unspecified Myalgia and Myositis 05/15/2007   Centricity Description: FIBROMYALGIA Qualifier: Diagnosis of  By: Janit Bern   Centricity Description: MYALGIA Qualifier: Diagnosis of  By: Janit Bern     Family History  Problem Relation Age of Onset  . Adopted: Yes  . COPD Mother   . Emphysema Mother   . Heart attack Mother   . Other Mother     tobacco abuse   Past Surgical History:  Procedure Laterality Date  . ABDOMINAL HYSTERECTOMY  1980's  . APPENDECTOMY  1980's   "w/either my hysterectomy or myomectomy" (10/02/2013)  .  BACK SURGERY    . CLOSED REDUCTION HAND FRACTURE Right 1990's   "it was crushed; wore cast from fingers to top of my shoulder" (10/02/2013)  . GASTRIC BYPASS  ~ 2003  . KNEE ARTHROSCOPY Right 2002  . MYOMECTOMY  1980's  . REDUCTION MAMMAPLASTY Bilateral 2001  . REFRACTIVE SURGERY Bilateral 2004  . SPINAL CORD STIMULATOR IMPLANT    . TONSILLECTOMY AND ADENOIDECTOMY  1955  . TUBAL LIGATION  1980's   Social History   Social History  . Marital status: Married    Spouse name: N/A  . Number of children: 2  . Years of education: N/A   Occupational History  .  Retired    Social History Main Topics  . Smoking status: Never Smoker  . Smokeless tobacco: Never Used  . Alcohol use No  . Drug use: No  . Sexual activity: No   Other Topics Concern  . Not on file   Social History Narrative  . No narrative on file     Review of Systems: General: negative for chills, fever, night sweats or weight changes.  Cardiovascular: negative for chest pain, dyspnea on exertion, edema, orthopnea, palpitations, paroxysmal nocturnal dyspnea or shortness of breath Dermatological: negative for rash Respiratory: negative for cough or wheezing Urologic: negative for hematuria Abdominal: negative for nausea, vomiting, diarrhea, bright red blood per rectum, melena, or hematemesis Neurologic: negative for visual changes, syncope, or dizziness All other systems reviewed and are otherwise negative except as noted above.   Physical Exam:  Blood pressure 140/80, pulse 100, height 5\' 5"  (1.651 m), weight 202 lb 12.8 oz (92 kg), SpO2 98 %.  General appearance: alert, cooperative and no distress Neck: no carotid bruit and no JVD Lungs: clear to auscultation bilaterally Heart: regular rate and rhythm, S1, S2 normal, no murmur, click, rub or gallop Extremities: no LEE Pulses: 2+ and symmetric Skin: warm and dry Neurologic: Grossly normal  EKG not performed  ASSESSMENT AND PLAN:   1. HTN: BP is better  controlled now that she is back on amlodipine. Continue daily. She also takes Lasix PRN based on LEE.   2. Exertional Dyspnea: recent CBC showed stable hgb at 13. She denies any CP. Lung exam is normal. We will check a 2D echo as well as order a Lexiscan NST to r/o cardiac etiologies.   3. LBP: chronic issue with radiating left leg pain. Symptoms seem more c/w sciatica. Patient advised to f/u with her back specialist.    PLAN  F/u in 2-3 weeks  Ernestina Joe PA-C 08/05/2016 2:14 PM

## 2016-08-17 ENCOUNTER — Telehealth (HOSPITAL_COMMUNITY): Payer: Self-pay | Admitting: *Deleted

## 2016-08-17 NOTE — Telephone Encounter (Signed)
Patient given detailed instructions per Myocardial Perfusion Study Information Sheet for the test on  08/20/16. Patient notified to arrive 15 minutes early and that it is imperative to arrive on time for appointment to keep from having the test rescheduled.  If you need to cancel or reschedule your appointment, please call the office within 24 hours of your appointment. Failure to do so may result in a cancellation of your appointment, and a $50 no show fee. Patient verbalized understanding. [name] Jacqueline    

## 2016-08-20 ENCOUNTER — Other Ambulatory Visit: Payer: Self-pay | Admitting: Orthopaedic Surgery

## 2016-08-20 ENCOUNTER — Other Ambulatory Visit: Payer: Self-pay

## 2016-08-20 ENCOUNTER — Ambulatory Visit (HOSPITAL_COMMUNITY): Payer: Medicare Other | Attending: Cardiology

## 2016-08-20 ENCOUNTER — Ambulatory Visit (HOSPITAL_BASED_OUTPATIENT_CLINIC_OR_DEPARTMENT_OTHER): Payer: Medicare Other

## 2016-08-20 DIAGNOSIS — R0609 Other forms of dyspnea: Secondary | ICD-10-CM | POA: Diagnosis present

## 2016-08-20 DIAGNOSIS — I77810 Thoracic aortic ectasia: Secondary | ICD-10-CM | POA: Insufficient documentation

## 2016-08-20 DIAGNOSIS — M5416 Radiculopathy, lumbar region: Secondary | ICD-10-CM

## 2016-08-20 DIAGNOSIS — Z6833 Body mass index (BMI) 33.0-33.9, adult: Secondary | ICD-10-CM | POA: Diagnosis not present

## 2016-08-20 DIAGNOSIS — I059 Rheumatic mitral valve disease, unspecified: Secondary | ICD-10-CM | POA: Diagnosis not present

## 2016-08-20 DIAGNOSIS — Z8249 Family history of ischemic heart disease and other diseases of the circulatory system: Secondary | ICD-10-CM | POA: Insufficient documentation

## 2016-08-20 DIAGNOSIS — I110 Hypertensive heart disease with heart failure: Secondary | ICD-10-CM | POA: Diagnosis not present

## 2016-08-20 DIAGNOSIS — K7689 Other specified diseases of liver: Secondary | ICD-10-CM | POA: Diagnosis not present

## 2016-08-20 DIAGNOSIS — I509 Heart failure, unspecified: Secondary | ICD-10-CM | POA: Insufficient documentation

## 2016-08-20 DIAGNOSIS — E669 Obesity, unspecified: Secondary | ICD-10-CM | POA: Insufficient documentation

## 2016-08-20 DIAGNOSIS — E785 Hyperlipidemia, unspecified: Secondary | ICD-10-CM | POA: Insufficient documentation

## 2016-08-20 DIAGNOSIS — I358 Other nonrheumatic aortic valve disorders: Secondary | ICD-10-CM | POA: Insufficient documentation

## 2016-08-20 LAB — MYOCARDIAL PERFUSION IMAGING
CHL CUP NUCLEAR SDS: 0
CHL CUP RESTING HR STRESS: 80 {beats}/min
CSEPPHR: 88 {beats}/min
LVDIAVOL: 98 mL (ref 46–106)
LVSYSVOL: 31 mL
RATE: 0.36
SRS: 6
SSS: 6
TID: 1.09

## 2016-08-20 MED ORDER — TECHNETIUM TC 99M TETROFOSMIN IV KIT
10.4000 | PACK | Freq: Once | INTRAVENOUS | Status: AC | PRN
  Administered 2016-08-20: 10.4 via INTRAVENOUS
  Filled 2016-08-20: qty 11

## 2016-08-20 MED ORDER — REGADENOSON 0.4 MG/5ML IV SOLN
0.4000 mg | Freq: Once | INTRAVENOUS | Status: AC
  Administered 2016-08-20: 0.4 mg via INTRAVENOUS

## 2016-08-20 MED ORDER — TECHNETIUM TC 99M TETROFOSMIN IV KIT
32.7000 | PACK | Freq: Once | INTRAVENOUS | Status: AC | PRN
  Administered 2016-08-20: 32.7 via INTRAVENOUS
  Filled 2016-08-20: qty 33

## 2016-08-25 ENCOUNTER — Ambulatory Visit: Payer: Medicare Other | Admitting: Cardiology

## 2016-09-03 ENCOUNTER — Telehealth: Payer: Self-pay | Admitting: Family Medicine

## 2016-09-03 ENCOUNTER — Ambulatory Visit (INDEPENDENT_AMBULATORY_CARE_PROVIDER_SITE_OTHER): Payer: Medicare Other | Admitting: Behavioral Health

## 2016-09-03 DIAGNOSIS — Z23 Encounter for immunization: Secondary | ICD-10-CM

## 2016-09-03 DIAGNOSIS — F909 Attention-deficit hyperactivity disorder, unspecified type: Secondary | ICD-10-CM

## 2016-09-03 MED ORDER — LISDEXAMFETAMINE DIMESYLATE 30 MG PO CAPS: 30.0000 mg | ORAL_CAPSULE | Freq: Every day | ORAL | 0 refills | Status: DC

## 2016-09-03 NOTE — Telephone Encounter (Signed)
Ok to fill 

## 2016-09-03 NOTE — Telephone Encounter (Signed)
Pt is requesting a refill for vyvance. She says that she will be in this afternoon to have a pneumonia vac. Pt would like to know if she can pick up then? Informed her that she will be advised.

## 2016-09-03 NOTE — Telephone Encounter (Signed)
Last ov 03/22/16. Last fill 07/08/16 #30 0.  Please advise.

## 2016-09-03 NOTE — Telephone Encounter (Signed)
Not due for PNA vaccination. Was she wanting flu shot as well?

## 2016-09-03 NOTE — Telephone Encounter (Signed)
Rx given to the patient.       KP 

## 2016-09-03 NOTE — Telephone Encounter (Signed)
Pt would like to come in to have her pneumonia vaccination today. Scheduled pt for 3:15 today.

## 2016-09-03 NOTE — Telephone Encounter (Signed)
Thank. I called pt to make her aware. She expressed understanding.

## 2016-09-08 ENCOUNTER — Ambulatory Visit
Admission: RE | Admit: 2016-09-08 | Discharge: 2016-09-08 | Disposition: A | Discharge status: Home / Self Care | Payer: Medicare Other | Source: Ambulatory Visit | Attending: Orthopaedic Surgery | Admitting: Orthopaedic Surgery

## 2016-09-08 VITALS — BP 129/67 | HR 88

## 2016-09-08 DIAGNOSIS — M79604 Pain in right leg: Secondary | ICD-10-CM

## 2016-09-08 DIAGNOSIS — M5416 Radiculopathy, lumbar region: Secondary | ICD-10-CM

## 2016-09-08 DIAGNOSIS — M79605 Pain in left leg: Principal | ICD-10-CM

## 2016-09-08 MED ORDER — DIAZEPAM 5 MG PO TABS
5.0000 mg | ORAL_TABLET | Freq: Once | ORAL | Status: AC
  Administered 2016-09-08: 5 mg via ORAL

## 2016-09-08 MED ORDER — ONDANSETRON HCL 4 MG/2ML IJ SOLN: 4.0000 mg | Freq: Four times a day (QID) | INTRAMUSCULAR | Status: DC | PRN

## 2016-09-08 MED ORDER — HYDROXYZINE HCL 50 MG/ML IM SOLN
25.0000 mg | Freq: Once | INTRAMUSCULAR | Status: AC
  Administered 2016-09-08: 25 mg via INTRAMUSCULAR

## 2016-09-08 MED ORDER — OXYCODONE-ACETAMINOPHEN 5-325 MG PO TABS
2.0000 | ORAL_TABLET | Freq: Once | ORAL | Status: AC
  Administered 2016-09-08: 2 via ORAL

## 2016-09-08 MED ORDER — IOPAMIDOL (ISOVUE-M 200) INJECTION 41%
15.0000 mL | Freq: Once | INTRAMUSCULAR | Status: AC
Start: 2016-09-08 — End: 2016-09-08
  Administered 2016-09-08: 15 mL via INTRATHECAL

## 2016-09-08 MED ORDER — HYDROMORPHONE HCL 2 MG/ML IJ SOLN
1.5000 mg | Freq: Once | INTRAMUSCULAR | Status: AC
  Administered 2016-09-08: 1.5 mg via INTRAMUSCULAR

## 2016-09-08 NOTE — Progress Notes (Signed)
Pt states she has been off Elavil, Vyvance, Imitrex and Effexor for the past 2 days.

## 2016-09-08 NOTE — Discharge Instructions (Signed)
Myelogram Discharge Instructions  1. Go home and rest quietly for the next 24 hours.  It is important to lie flat for the next 24 hours.  Get up only to go to the restroom.  You may lie in the bed or on a couch on your back, your stomach, your left side or your right side.  You may have one pillow under your head.  You may have pillows between your knees while you are on your side or under your knees while you are on your back.  2. DO NOT drive today.  Recline the seat as far back as it will go, while still wearing your seat belt, on the way home.  3. You may get up to go to the bathroom as needed.  You may sit up for 10 minutes to eat.  You may resume your normal diet and medications unless otherwise indicated.  Drink lots of extra fluids today and tomorrow.  4. The incidence of headache, nausea, or vomiting is about 5% (one in 20 patients).  If you develop a headache, lie flat and drink plenty of fluids until the headache goes away.  Caffeinated beverages may be helpful.  If you develop severe nausea and vomiting or a headache that does not go away with flat bed rest, call [phone].  5. You may resume normal activities after your 24 hours of bed rest is over; however, do not exert yourself strongly or do any heavy lifting tomorrow. If when you get up you have a headache when standing, go back to bed and force fluids for another 24 hours.  6. Call your physician for a follow-up appointment.  The results of your myelogram will be sent directly to your physician by the following day.  7. If you have any questions or if complications develop after you arrive home, please call [phone].  Discharge instructions have been explained to the patient.  The patient, or the person responsible for the patient, fully understands these instructions.        May resume Elavil, Vyvance, Imitrex and Effexor on Nov. 2, 2017, after 10:30 am.

## 2016-10-04 ENCOUNTER — Other Ambulatory Visit: Payer: Self-pay

## 2016-10-04 ENCOUNTER — Telehealth: Payer: Self-pay | Admitting: Family Medicine

## 2016-10-04 ENCOUNTER — Telehealth: Payer: Self-pay

## 2016-10-04 DIAGNOSIS — F909 Attention-deficit hyperactivity disorder, unspecified type: Secondary | ICD-10-CM

## 2016-10-04 MED ORDER — LISDEXAMFETAMINE DIMESYLATE 30 MG PO CAPS: 30.0000 mg | ORAL_CAPSULE | Freq: Every day | ORAL | 0 refills | Status: DC

## 2016-10-04 NOTE — Telephone Encounter (Signed)
lisdexamfetamine (VYVANSE) 30 MG capsule  Last ov 07/08/16. Last fill 09/03/16 #30 0. Please advise.

## 2016-10-04 NOTE — Telephone Encounter (Signed)
error:[id number] ° °

## 2016-10-04 NOTE — Telephone Encounter (Signed)
Called pt to inform her that the Rx Vyvanse is in front ready for pick up. Pt states she will pick up Rx tomorrow morning. LB

## 2016-10-04 NOTE — Progress Notes (Signed)
Rx printed and awaiting providers signature. LB

## 2016-10-04 NOTE — Telephone Encounter (Signed)
Ok to refill 

## 2016-10-04 NOTE — Telephone Encounter (Signed)
Refill

## 2016-10-04 NOTE — Telephone Encounter (Signed)
Lalisdexamfetamine (VYVANSE) 30 MG capsule  last ov 07/08/16. Last fill 09/03/16 #30 0. Please advise.

## 2016-10-04 NOTE — Telephone Encounter (Signed)
Rx has been printed signed and faxed  Pc

## 2016-10-11 ENCOUNTER — Encounter: Payer: Self-pay | Admitting: Family Medicine

## 2016-10-11 ENCOUNTER — Ambulatory Visit (HOSPITAL_BASED_OUTPATIENT_CLINIC_OR_DEPARTMENT_OTHER)
Admission: RE | Admit: 2016-10-11 | Discharge: 2016-10-11 | Disposition: A | Discharge status: Home / Self Care | Payer: Medicare Other | Source: Ambulatory Visit | Attending: Family Medicine | Admitting: Family Medicine

## 2016-10-11 ENCOUNTER — Ambulatory Visit (INDEPENDENT_AMBULATORY_CARE_PROVIDER_SITE_OTHER): Payer: Medicare Other | Admitting: Family Medicine

## 2016-10-11 VITALS — BP 122/64 | HR 90 | Temp 98.5°F | Resp 16 | Ht 65.0 in | Wt 196.6 lb

## 2016-10-11 DIAGNOSIS — J209 Acute bronchitis, unspecified: Secondary | ICD-10-CM | POA: Diagnosis not present

## 2016-10-11 DIAGNOSIS — R05 Cough: Secondary | ICD-10-CM | POA: Diagnosis not present

## 2016-10-11 DIAGNOSIS — R059 Cough, unspecified: Secondary | ICD-10-CM

## 2016-10-11 DIAGNOSIS — F411 Generalized anxiety disorder: Secondary | ICD-10-CM

## 2016-10-11 DIAGNOSIS — M8448XA Pathological fracture, other site, initial encounter for fracture: Secondary | ICD-10-CM | POA: Insufficient documentation

## 2016-10-11 MED ORDER — CLONAZEPAM 0.5 MG PO TABS: ORAL_TABLET | ORAL | 2 refills | Status: DC

## 2016-10-11 MED ORDER — LEVOCETIRIZINE DIHYDROCHLORIDE 5 MG PO TABS: 5.0000 mg | ORAL_TABLET | Freq: Every evening | ORAL | 5 refills | Status: DC

## 2016-10-11 MED ORDER — VENLAFAXINE HCL ER 75 MG PO CP24: 225.0000 mg | ORAL_CAPSULE | Freq: Every day | ORAL | 5 refills | Status: DC

## 2016-10-11 MED ORDER — AMITRIPTYLINE HCL 75 MG PO TABS: 75.0000 mg | ORAL_TABLET | Freq: Every day | ORAL | 1 refills | Status: DC

## 2016-10-11 MED ORDER — PROMETHAZINE-DM 6.25-15 MG/5ML PO SYRP: 5.0000 mL | ORAL_SOLUTION | Freq: Four times a day (QID) | ORAL | 0 refills | Status: DC | PRN

## 2016-10-11 MED ORDER — AZITHROMYCIN 250 MG PO TABS: ORAL_TABLET | ORAL | 0 refills | Status: DC

## 2016-10-11 NOTE — Progress Notes (Signed)
Patient ID: Molly HawthorneRita C Schaner, female    DOB: 11/19/1945  Age: 70 y.o. MRN: [id number]    Subjective:  Subjective  HPI Molly HawthorneRita C Wardrip presents for c/o cough x months.  Productive cough.  No fever, chills  No sore throat, no nasal congestion.  Took otc --mucinex with some relief but it will not completely go away.    Review of Systems  Constitutional: Positive for chills. Negative for fever.  HENT: Negative for congestion, postnasal drip, rhinorrhea and sinus pressure.   Respiratory: Positive for cough, chest tightness, shortness of breath and wheezing.   Cardiovascular: Negative for chest pain, palpitations and leg swelling.  Allergic/Immunologic: Negative for environmental allergies.    History Past Medical History:  Diagnosis Date  . ADD (attention deficit disorder) 05/24/2012  . ANEMIA, MILD 06/26/2008   Qualifier: Diagnosis of  By: Janit BernLowne DO, Yvonne    . Anxiety   . Arthritis    "joints, back" (10/02/2013)  . Chronic back pain    "all over my back" (10/02/2013)  . Congestive heart disease (HCC)    "I have the beginning signs" (10/02/2013)  . Crushing injury of arm, right 02/06/1989's   "it was crushed; wore cast from fingers to top of my shoulder" (11/25/2  . Crushing injury of left wrist 05/11/1989's  . DIZZINESS 12/31/2009   Qualifier: Diagnosis of  By: Oswald HillockGraham-Oliver, Karen    . Fibromyalgia   . GERD 12/03/2008   Qualifier: Diagnosis of  By: Freddy JakschHooks, Phyllis    . HYPERLIPIDEMIA 05/15/2007   Qualifier: Diagnosis of  By: Janit BernLowne DO, Yvonne ; pt denies this hx on 10/02/2013  . Hypertension   . Idiopathic scoliosis 04/19/2013  . Migraines    "once/month" (10/02/2013)  . Osteoporosis, unspecified 04/19/2013  . Other vitamin B12 deficiency anemia 10/30/2009   Qualifier: Diagnosis of  By: Floydene FlockHoops, Regina    . PALPITATIONS, RECURRENT 12/12/2009   Qualifier: Diagnosis of  By: Janit BernLowne DO, Yvonne    . Pica 12/28/2007   Qualifier: Diagnosis of  By: Janit BernLowne DO, Yvonne    . Pneumonia 10/02/2013   "had  onsets of it before; not full blown til now" (10/02/2013)  . PSTPRC STATUS, BARIATRIC SURGERY 05/15/2007   Qualifier: Diagnosis of  By: Janit BernLowne DO, Yvonne    . Pulmonary nodules/lesions, multiple   . TREMOR, ESSENTIAL 12/12/2009   Qualifier: Diagnosis of  By: Janit BernLowne DO, Yvonne    . Unspecified Myalgia and Myositis 05/15/2007   Centricity Description: FIBROMYALGIA Qualifier: Diagnosis of  By: Janit BernLowne DO, Yvonne   Centricity Description: MYALGIA Qualifier: Diagnosis of  By: Janit BernLowne DO, Yvonne      She has a past surgical history that includes Gastric bypass (~ 2003); Abdominal hysterectomy (1980's); Knee arthroscopy (Right, 2002); Tonsillectomy and adenoidectomy (1955); Myomectomy (1980's); Appendectomy (1980's); Tubal ligation (1980's); Reduction mammaplasty (Bilateral, 2001); Refractive surgery (Bilateral, 2004); Closed reduction hand fracture (Right, 1990's); Back surgery; and Spinal cord stimulator implant.   Her family history includes COPD in her mother; Emphysema in her mother; Heart attack in her mother; Other in her mother. She was adopted.She reports that she has never smoked. She has never used smokeless tobacco. She reports that she does not drink alcohol or use drugs.  Current Outpatient Prescriptions on File Prior to Visit  Medication Sig Dispense Refill  . amLODipine (NORVASC) 5 MG tablet Take 1 tablet (5 mg total) by mouth daily. 90 tablet 3  . aspirin 81 MG tablet Take 81 mg by mouth daily.    . furosemide (  LASIX) 40 MG tablet Take 40 mg by mouth daily as needed for edema.    [name]. lisdexamfetamine (VYVANSE) 30 MG capsule Take 1 capsule (30 mg total) by mouth daily. 30 capsule 0  . meloxicam (MOBIC) 15 MG tablet Take 1 tablet by mouth daily.    [name]. morphine (MSIR) 15 MG tablet Take 15 mg by mouth every 12 (twelve) hours as needed for severe pain (for pain).    . Oxycodone HCl 20 MG TABS Take 1 tablet by mouth 4 (four) times daily as needed (pain).     . SUMAtriptan (IMITREX) 50 MG tablet Take 50  mg by mouth every 2 (two) hours as needed for migraine. May repeat in 2 hours if headache persists or recurs.    [name]. tiZANidine (ZANAFLEX) 4 MG capsule Take 4 mg by mouth 3 (three) times daily as needed for muscle spasms.    . potassium chloride SA (K-DUR,KLOR-CON) 20 MEQ tablet Take 20 mEq by mouth daily as needed. When taking lasix     No current facility-administered medications on file prior to visit.      Objective:  Objective  Physical Exam  Constitutional: She is oriented to person, place, and time. She appears well-developed and well-nourished.  HENT:  Right Ear: External ear normal.  Left Ear: External ear normal.  Eyes: Conjunctivae are normal. Right eye exhibits no discharge. Left eye exhibits no discharge.  Cardiovascular: Normal rate, regular rhythm and normal heart sounds.   No murmur heard. Pulmonary/Chest: Effort normal. No respiratory distress. She has wheezes. She has no rales. She exhibits no tenderness.  Musculoskeletal: She exhibits no edema.  Lymphadenopathy:    She has cervical adenopathy.  Neurological: She is alert and oriented to person, place, and time.  Nursing note and vitals reviewed.  BP 122/64 (BP Location: Right Arm, Patient Position: Sitting, Cuff Size: Large)   Pulse 90   Temp 98.5 F (36.9 C) (Oral)   Resp 16   Ht 5\' 5"  (1.651 m)   Wt 196 lb 9.6 oz (89.2 kg)   SpO2 94%   BMI 32.72 kg/m  Wt Readings from Last 3 Encounters:  10/11/16 196 lb 9.6 oz (89.2 kg)  08/05/16 202 lb 12.8 oz (92 kg)  07/08/16 191 lb (86.6 kg)     Lab Results  Component Value Date   WBC 4.0 07/05/2016   HGB 13.0 07/05/2016   HCT 39.5 07/05/2016   PLT 117 (L) 07/05/2016   GLUCOSE 87 07/05/2016   CHOL 182 10/28/2009   TRIG 155.0 (H) 10/28/2009   HDL 61.30 10/28/2009   LDLDIRECT 129.1 07/17/2007   LDLCALC 90 10/28/2009   ALT 9 03/07/2015   AST 21 03/07/2015   NA 141 07/05/2016   K 4.7 07/05/2016   CL 104 07/05/2016   CREATININE 0.89 07/05/2016   BUN 15  07/05/2016   CO2 31 07/05/2016   TSH 1.26 04/22/2014   INR 1.14 03/07/2015   HGBA1C 5.3 04/22/2014    Ct Lumbar Spine W Contrast  Result Date: 09/08/2016 CLINICAL DATA:  Spondylosis without myelopathy. Low back pain with development of left leg pain. EXAM: LUMBAR MYELOGRAM FLUOROSCOPY TIME:  1 minutes 1 second. 212.44 micro gray meter squared PROCEDURE: After thorough discussion of risks and benefits of the procedure including bleeding, infection, injury to nerves, blood vessels, adjacent structures as well as headache and CSF leak, written and oral informed consent was obtained. Consent was obtained by Dr. Paulina FusiMark Shogry. Time out form was completed. Patient was positioned prone  on the fluoroscopy table. Local anesthesia was provided with 1% lidocaine without epinephrine after prepped and draped in the usual sterile fashion. Puncture was performed at L3-4 using a 3 1/2 inch 22-gauge spinal needle via right para median approach. Using a single pass through the dura, the needle was placed within the thecal sac, with return of clear CSF. 15 mL of Isovue 200 was injected into the thecal sac, with normal opacification of the nerve roots and cauda equina consistent with free flow within the subarachnoid space. I personally performed the lumbar puncture and administered the intrathecal contrast. I also personally performed acquisition of the myelogram images. TECHNIQUE: Contiguous axial images were obtained through the Lumbar spine after the intrathecal infusion of infusion. Coronal and sagittal reconstructions were obtained of the axial image sets. COMPARISON:  MRI 11/05/2014 FINDINGS: LUMBAR MYELOGRAM FINDINGS: There is are old appearing compression deformities at T12, L2 and L3. No retropulsed bone. Small anterior extradural defects at T12-L1, L1-2 and L2-3. Areas of spinal stenosis at L1-2 and L2-3 could be significant. See results of CT. Small anterior extradural defects at L3-4 and L4-5 without apparent  compressive stenosis. Large left-sided extradural defect at L5-S1 apparently compressing the left S1 nerve root most consistent with disc herniation. With standing, the patient develops 1 cm of anterolisthesis at L5-S1. This does not change significantly with flexion extension. CT LUMBAR MYELOGRAM FINDINGS: There is curvature convex to the left. There are old compression fractures at T12, L2 and L3. No evidence of recent compression fracture. Conus tip is low, at L3. T11-12: Disc degeneration with vacuum phenomenon. Posterior bowing of the posterior superior margin of the T12 vertebral body of 4 mm, indenting the ventral subarachnoid space. No compression of the cord. No foraminal compromise. T12-L1: Disc degeneration with vacuum phenomenon. Minimal bulging of the disc. No compressive stenosis. L1-2: Disc degeneration with vacuum phenomenon. Minimal bulging of the disc. No compressive stenosis. Posterior bowing of the posterior superior margin of the L2 vertebral body, indenting the thecal sac, but without visible neural compression. Bilateral facet arthropathy at this level. Mild stenosis of the lateral recesses and foramina without distinct neural compression. L2-3: Bulging of the disc. Bilateral facet arthropathy possibly with fusion. No compressive stenosis of the canal or foramina. L4-5: Disc degeneration with vacuum phenomenon and mild bulging. Bilateral facet arthropathy. Mild stenosis of the lateral recesses and foramina right more than left but without visible neural compression. L5-S1: Anterolisthesis of only 5 mm in this position, known to increased 1 cm with standing. Large left posterior lateral disc herniation compressing the left side of the thecal sac and at least the left S1 nerve root. Bilateral facet arthropathy responsible for the anterolisthesis. Foraminal narrowing bilaterally that would worsen with standing. IMPRESSION: Large left posterior lateral disc herniation at L5-S1 compressing the left  S1 nerve root. There is advanced facet arthropathy at this level with 5 mm of anterolisthesis in the supine position, increasing to 1 cm with standing, but not increasing further with flexion. Foraminal encroachment could also affect the L5 nerve roots. Old healed compression deformities at T12, L2 and L3. L2-3 bilateral facet arthropathy encroaching upon the posterior aspect of the spinal canal and foramina. Posterior bowing of the posterior superior margin of the vertebral body. Conus tip is at L3. Distinct focal neural compression is not demonstrated however. Disc bulge and facet hypertrophy at L4-5 with mild narrowing of the lateral recesses and foramina right more than left but no definite neural compression. Electronically Signed   By:  Paulina Fusi M.D.   On: 09/08/2016 15:04   Dg Myelography Lumbar Inj Lumbosacral  Result Date: 09/08/2016 CLINICAL DATA:  Spondylosis without myelopathy. Low back pain with development of left leg pain. EXAM: LUMBAR MYELOGRAM FLUOROSCOPY TIME:  1 minutes 1 second. 212.44 micro gray meter squared PROCEDURE: After thorough discussion of risks and benefits of the procedure including bleeding, infection, injury to nerves, blood vessels, adjacent structures as well as headache and CSF leak, written and oral informed consent was obtained. Consent was obtained by Dr. Paulina Fusi. Time out form was completed. Patient was positioned prone on the fluoroscopy table. Local anesthesia was provided with 1% lidocaine without epinephrine after prepped and draped in the usual sterile fashion. Puncture was performed at L3-4 using a 3 1/2 inch 22-gauge spinal needle via right para median approach. Using a single pass through the dura, the needle was placed within the thecal sac, with return of clear CSF. 15 mL of Isovue 200 was injected into the thecal sac, with normal opacification of the nerve roots and cauda equina consistent with free flow within the subarachnoid space. I personally  performed the lumbar puncture and administered the intrathecal contrast. I also personally performed acquisition of the myelogram images. TECHNIQUE: Contiguous axial images were obtained through the Lumbar spine after the intrathecal infusion of infusion. Coronal and sagittal reconstructions were obtained of the axial image sets. COMPARISON:  MRI 11/05/2014 FINDINGS: LUMBAR MYELOGRAM FINDINGS: There is are old appearing compression deformities at T12, L2 and L3. No retropulsed bone. Small anterior extradural defects at T12-L1, L1-2 and L2-3. Areas of spinal stenosis at L1-2 and L2-3 could be significant. See results of CT. Small anterior extradural defects at L3-4 and L4-5 without apparent compressive stenosis. Large left-sided extradural defect at L5-S1 apparently compressing the left S1 nerve root most consistent with disc herniation. With standing, the patient develops 1 cm of anterolisthesis at L5-S1. This does not change significantly with flexion extension. CT LUMBAR MYELOGRAM FINDINGS: There is curvature convex to the left. There are old compression fractures at T12, L2 and L3. No evidence of recent compression fracture. Conus tip is low, at L3. T11-12: Disc degeneration with vacuum phenomenon. Posterior bowing of the posterior superior margin of the T12 vertebral body of 4 mm, indenting the ventral subarachnoid space. No compression of the cord. No foraminal compromise. T12-L1: Disc degeneration with vacuum phenomenon. Minimal bulging of the disc. No compressive stenosis. L1-2: Disc degeneration with vacuum phenomenon. Minimal bulging of the disc. No compressive stenosis. Posterior bowing of the posterior superior margin of the L2 vertebral body, indenting the thecal sac, but without visible neural compression. Bilateral facet arthropathy at this level. Mild stenosis of the lateral recesses and foramina without distinct neural compression. L2-3: Bulging of the disc. Bilateral facet arthropathy possibly with  fusion. No compressive stenosis of the canal or foramina. L4-5: Disc degeneration with vacuum phenomenon and mild bulging. Bilateral facet arthropathy. Mild stenosis of the lateral recesses and foramina right more than left but without visible neural compression. L5-S1: Anterolisthesis of only 5 mm in this position, known to increased 1 cm with standing. Large left posterior lateral disc herniation compressing the left side of the thecal sac and at least the left S1 nerve root. Bilateral facet arthropathy responsible for the anterolisthesis. Foraminal narrowing bilaterally that would worsen with standing. IMPRESSION: Large left posterior lateral disc herniation at L5-S1 compressing the left S1 nerve root. There is advanced facet arthropathy at this level with 5 mm of anterolisthesis in the  supine position, increasing to 1 cm with standing, but not increasing further with flexion. Foraminal encroachment could also affect the L5 nerve roots. Old healed compression deformities at T12, L2 and L3. L2-3 bilateral facet arthropathy encroaching upon the posterior aspect of the spinal canal and foramina. Posterior bowing of the posterior superior margin of the vertebral body. Conus tip is at L3. Distinct focal neural compression is not demonstrated however. Disc bulge and facet hypertrophy at L4-5 with mild narrowing of the lateral recesses and foramina right more than left but no definite neural compression. Electronically Signed   By: Paulina Fusi M.D.   On: 09/08/2016 15:04     Assessment & Plan:  Plan  I have changed Ms. Karas amitriptyline. I am also having her start on azithromycin. Additionally, I am having her maintain her aspirin, Oxycodone HCl, meloxicam, SUMAtriptan, morphine, furosemide, potassium chloride SA, amLODipine, tiZANidine, lisdexamfetamine, clonazePAM, venlafaxine XR, promethazine-dextromethorphan, and levocetirizine.  Meds ordered this encounter  Medications  . azithromycin (ZITHROMAX  Z-PAK) 250 MG tablet    Sig: As directed    Dispense:  6 each    Refill:  0  . clonazePAM (KLONOPIN) 0.5 MG tablet    Sig: TAKE ONE (1) TABLET BY MOUTH 3 TIMES DAILY AS NEEDED FOR ANXIETY    Dispense:  90 tablet    Refill:  2  . venlafaxine XR (EFFEXOR-XR) 75 MG 24 hr capsule    Sig: Take 3 capsules (225 mg total) by mouth daily.    Dispense:  90 capsule    Refill:  5  . amitriptyline (ELAVIL) 75 MG tablet    Sig: Take 1 tablet (75 mg total) by mouth daily.    Dispense:  90 tablet    Refill:  1  . DISCONTD: promethazine-dextromethorphan (PROMETHAZINE-DM) 6.25-15 MG/5ML syrup    Sig: Take 5 mLs by mouth 4 (four) times daily as needed.    Dispense:  118 mL    Refill:  0  . DISCONTD: levocetirizine (XYZAL) 5 MG tablet    Sig: Take 1 tablet (5 mg total) by mouth every evening.    Dispense:  30 tablet    Refill:  5  . promethazine-dextromethorphan (PROMETHAZINE-DM) 6.25-15 MG/5ML syrup    Sig: Take 5 mLs by mouth 4 (four) times daily as needed.    Dispense:  118 mL    Refill:  0  . levocetirizine (XYZAL) 5 MG tablet    Sig: Take 1 tablet (5 mg total) by mouth every evening.    Dispense:  30 tablet    Refill:  5    Problem List Items Addressed This Visit      Unprioritized   ACUTE BRONCHITIS   Relevant Medications   azithromycin (ZITHROMAX Z-PAK) 250 MG tablet    Other Visit Diagnoses    Cough    -  Primary   Relevant Medications   promethazine-dextromethorphan (PROMETHAZINE-DM) 6.25-15 MG/5ML syrup   levocetirizine (XYZAL) 5 MG tablet   Other Relevant Orders   DG Chest 2 View (Completed)   Generalized anxiety disorder       Relevant Medications   clonazePAM (KLONOPIN) 0.5 MG tablet   venlafaxine XR (EFFEXOR-XR) 75 MG 24 hr capsule   amitriptyline (ELAVIL) 75 MG tablet      Follow-up: Return if symptoms worsen or fail to improve.  Donato Schultz, DO

## 2016-10-11 NOTE — Patient Instructions (Signed)

## 2016-10-11 NOTE — Progress Notes (Signed)
Pre visit review using our clinic review tool, if applicable. No additional management support is needed unless otherwise documented below in the visit note. 

## 2016-10-13 ENCOUNTER — Telehealth: Payer: Self-pay | Admitting: Family Medicine

## 2016-10-13 ENCOUNTER — Other Ambulatory Visit: Payer: Self-pay

## 2016-10-13 DIAGNOSIS — F411 Generalized anxiety disorder: Secondary | ICD-10-CM

## 2016-10-13 MED ORDER — VENLAFAXINE HCL ER 75 MG PO CP24: 225.0000 mg | ORAL_CAPSULE | Freq: Every day | ORAL | 5 refills | Status: DC

## 2016-10-13 NOTE — Telephone Encounter (Signed)
°  Relationship to patient: Self  Can be reached: [phone]   Reason for call: States that when she was in the office she was given someone else's Rx but she threw it away. States she needs a refill on  venlafaxine XR (EFFEXOR-XR) 75 MG 24 hr capsule [[id number]][[id number]]. Patient's medications were sent to a pharmacy in [place].  ALSO: Need results from her Chest XRay.

## 2016-10-13 NOTE — Telephone Encounter (Signed)
Pharmacy received Rx day of ov, confirmed pharmacy received Rx. X ray has not been resulted by provider. Pt notified. LB

## 2016-10-20 ENCOUNTER — Encounter: Payer: Self-pay | Admitting: Family Medicine

## 2016-10-20 ENCOUNTER — Telehealth: Payer: Self-pay | Admitting: Family Medicine

## 2016-10-20 NOTE — Telephone Encounter (Signed)
Patient scheduled medicare wellness appointment for 01/21/2017 with Lawanna KobusAngel at 10am.

## 2016-10-20 NOTE — Telephone Encounter (Signed)
error:[id number] ° °

## 2016-10-20 NOTE — Telephone Encounter (Addendum)
While on the phone, pt mentioned that Dr. Laury AxonLowne prescribed her azithromycin on 10/11/16 for acute bronchitis.  Pt states the antibiotic was therapeutic and resolved cough and congestion.  Pt took her last pill on Friday, 10/15/16 and last night cough and congestion returned.  Pt would like another round of  Zpak before her back surgery on the 20th.  Pt denied chest pain, shortness of breath, wheezing, fever, chills.   Please advise.

## 2016-10-20 NOTE — Telephone Encounter (Signed)
z pak stays in system for 10 days

## 2016-10-21 ENCOUNTER — Encounter: Payer: Self-pay | Admitting: *Deleted

## 2016-10-21 NOTE — Telephone Encounter (Signed)
Called and made patient aware.  She stated understanding.  She was encouraged to call back if symptoms worsen or new symptoms developed.  She agreed she would.

## 2016-10-21 NOTE — Telephone Encounter (Signed)
This encounter was created in error - please disregard.

## 2016-11-02 ENCOUNTER — Other Ambulatory Visit: Payer: Self-pay | Admitting: Family Medicine

## 2016-11-02 DIAGNOSIS — F909 Attention-deficit hyperactivity disorder, unspecified type: Secondary | ICD-10-CM

## 2016-11-02 NOTE — Telephone Encounter (Signed)
°  Relationship to patient: Self Can be reached: [phone]    Reason for call: Refill lisdexamfetamine (VYVANSE) 30 MG capsule [[id number]

## 2016-11-03 ENCOUNTER — Encounter: Payer: Self-pay | Admitting: Family Medicine

## 2016-11-03 MED ORDER — LISDEXAMFETAMINE DIMESYLATE 30 MG PO CAPS: 30.0000 mg | ORAL_CAPSULE | Freq: Every day | ORAL | 0 refills | Status: DC

## 2016-11-03 NOTE — Telephone Encounter (Signed)
Ok, refilled for one month.  Please let her know

## 2016-11-03 NOTE — Telephone Encounter (Signed)
Last ov 10/11/16. Last fill 10/04/16 #30 0. Please advise.

## 2016-11-03 NOTE — Telephone Encounter (Signed)
Patient called to check the status of this medication. Please advise

## 2016-11-05 NOTE — Telephone Encounter (Signed)
See my chart message.  Patient notified

## 2016-11-29 ENCOUNTER — Telehealth: Payer: Self-pay | Admitting: Family Medicine

## 2016-11-29 ENCOUNTER — Encounter: Payer: Self-pay | Admitting: Family Medicine

## 2016-11-29 ENCOUNTER — Ambulatory Visit: Payer: Medicare Other | Admitting: Family Medicine

## 2016-11-29 NOTE — Telephone Encounter (Signed)
charge 

## 2016-11-29 NOTE — Telephone Encounter (Signed)
Pt called in at 11:00 this morning to confirm appt time. Pt says that she will not have enough time to make her appt. Pt rescheduled her appt to Friday.    Should pt be charged for missed visit?

## 2016-12-03 ENCOUNTER — Ambulatory Visit (INDEPENDENT_AMBULATORY_CARE_PROVIDER_SITE_OTHER): Payer: Medicare Other | Admitting: Family Medicine

## 2016-12-03 ENCOUNTER — Encounter: Payer: Self-pay | Admitting: Family Medicine

## 2016-12-03 DIAGNOSIS — F988 Other specified behavioral and emotional disorders with onset usually occurring in childhood and adolescence: Secondary | ICD-10-CM | POA: Diagnosis not present

## 2016-12-03 MED ORDER — AMPHETAMINE-DEXTROAMPHET ER 30 MG PO CP24: 30.0000 mg | ORAL_CAPSULE | ORAL | 0 refills | Status: DC

## 2016-12-03 NOTE — Progress Notes (Signed)
Subjective:    Patient ID: [name], female    DOB: Mar 23, 1946, 71 y.o.   MRN: [id number] I acted as a Neurosurgeonscribe for Dr. Zola ButtonLowne-Chase.  Apolonio SchneidersSheketia, CMA  Chief Complaint  Patient presents with  . Follow-up    wants to switch back to adderall    HPI Patient is in today for follow up medication.  She would like to change from Vyvanse to Adderall.  Past Medical History:  Diagnosis Date  . ADD (attention deficit disorder) 05/24/2012  . ANEMIA, MILD 06/26/2008   Qualifier: Diagnosis of  By: Janit BernLowne DO, Yvonne    . Anxiety   . Arthritis    "joints, back" (10/02/2013)  . Chronic back pain    "all over my back" (10/02/2013)  . Congestive heart disease (HCC)    "I have the beginning signs" (10/02/2013)  . Crushing injury of arm, right 02/06/1989's   "it was crushed; wore cast from fingers to top of my shoulder" (11/25/2  . Crushing injury of left wrist 05/11/1989's  . DIZZINESS 12/31/2009   Qualifier: Diagnosis of  By: Oswald HillockGraham-Oliver, Karen    . Fibromyalgia   . GERD 12/03/2008   Qualifier: Diagnosis of  By: Freddy JakschHooks, Phyllis    . HYPERLIPIDEMIA 05/15/2007   Qualifier: Diagnosis of  By: Janit BernLowne DO, Yvonne ; pt denies this hx on 10/02/2013  . Hypertension   . Idiopathic scoliosis 04/19/2013  . Migraines    "once/month" (10/02/2013)  . Osteoporosis, unspecified 04/19/2013  . Other vitamin B12 deficiency anemia 10/30/2009   Qualifier: Diagnosis of  By: Floydene FlockHoops, Regina    . PALPITATIONS, RECURRENT 12/12/2009   Qualifier: Diagnosis of  By: Janit BernLowne DO, Yvonne    . Pica 12/28/2007   Qualifier: Diagnosis of  By: Janit BernLowne DO, Yvonne    . Pneumonia 10/02/2013   "had onsets of it before; not full blown til now" (10/02/2013)  . PSTPRC STATUS, BARIATRIC SURGERY 05/15/2007   Qualifier: Diagnosis of  By: Janit BernLowne DO, Yvonne    . Pulmonary nodules/lesions, multiple   . TREMOR, ESSENTIAL 12/12/2009   Qualifier: Diagnosis of  By: Janit BernLowne DO, Yvonne    . Unspecified Myalgia and Myositis 05/15/2007   Centricity Description:  FIBROMYALGIA Qualifier: Diagnosis of  By: Janit BernLowne DO, Yvonne   Centricity Description: MYALGIA Qualifier: Diagnosis of  By: Janit BernLowne DO, Yvonne      Past Surgical History:  Procedure Laterality Date  . ABDOMINAL HYSTERECTOMY  1980's  . APPENDECTOMY  1980's   "w/either my hysterectomy or myomectomy" (10/02/2013)  . BACK SURGERY    . CLOSED REDUCTION HAND FRACTURE Right 1990's   "it was crushed; wore cast from fingers to top of my shoulder" (10/02/2013)  . GASTRIC BYPASS  ~ 2003  . KNEE ARTHROSCOPY Right 2002  . MYOMECTOMY  1980's  . REDUCTION MAMMAPLASTY Bilateral 2001  . REFRACTIVE SURGERY Bilateral 2004  . SPINAL CORD STIMULATOR IMPLANT    . TONSILLECTOMY AND ADENOIDECTOMY  1955  . TUBAL LIGATION  1980's    Family History  Problem Relation Age of Onset  . Adopted: Yes  . COPD Mother   . Emphysema Mother   . Heart attack Mother   . Other Mother     tobacco abuse    Social History   Social History  . Marital status: Married    Spouse name: N/A  . Number of children: 2  . Years of education: N/A   Occupational History  . Retired    Social History Main Topics  . Smoking  status: Never Smoker  . Smokeless tobacco: Never Used  . Alcohol use No  . Drug use: No  . Sexual activity: No   Other Topics Concern  . Not on file   Social History Narrative  . No narrative on file    Outpatient Medications Prior to Visit  Medication Sig Dispense Refill  . amitriptyline (ELAVIL) 75 MG tablet Take 1 tablet (75 mg total) by mouth daily. 90 tablet 1  . amLODipine (NORVASC) 5 MG tablet Take 1 tablet (5 mg total) by mouth daily. 90 tablet 3  . aspirin 81 MG tablet Take 81 mg by mouth daily.    . clonazePAM (KLONOPIN) 0.5 MG tablet TAKE ONE (1) TABLET BY MOUTH 3 TIMES DAILY AS NEEDED FOR ANXIETY 90 tablet 2  . furosemide (LASIX) 40 MG tablet Take 40 mg by mouth daily as needed for edema.    . meloxicam (MOBIC) 15 MG tablet Take 1 tablet by mouth daily.    [name] morphine (MSIR) 15 MG  tablet Take 15 mg by mouth every 12 (twelve) hours as needed for severe pain (for pain).    . Oxycodone HCl 20 MG TABS Take 1 tablet by mouth 4 (four) times daily as needed (pain).     . SUMAtriptan (IMITREX) 50 MG tablet Take 50 mg by mouth every 2 (two) hours as needed for migraine. May repeat in 2 hours if headache persists or recurs.    [name] tiZANidine (ZANAFLEX) 4 MG capsule Take 4 mg by mouth 3 (three) times daily as needed for muscle spasms.    [name] venlafaxine XR (EFFEXOR-XR) 75 MG 24 hr capsule Take 3 capsules (225 mg total) by mouth daily. 90 capsule 5  . lisdexamfetamine (VYVANSE) 30 MG capsule Take 1 capsule (30 mg total) by mouth daily. 30 capsule 0  . azithromycin (ZITHROMAX Z-PAK) 250 MG tablet As directed 6 each 0  . levocetirizine (XYZAL) 5 MG tablet Take 1 tablet (5 mg total) by mouth every evening. 30 tablet 5  . potassium chloride SA (K-DUR,KLOR-CON) 20 MEQ tablet Take 20 mEq by mouth daily as needed. When taking lasix    . promethazine-dextromethorphan (PROMETHAZINE-DM) 6.25-15 MG/5ML syrup Take 5 mLs by mouth 4 (four) times daily as needed. 118 mL 0   No facility-administered medications prior to visit.     No Known Allergies  Review of Systems  Constitutional: Negative for fever and malaise/fatigue.  HENT: Negative for congestion.   Eyes: Negative for blurred vision.  Respiratory: Negative for cough and shortness of breath.   Cardiovascular: Negative for chest pain, palpitations and leg swelling.  Gastrointestinal: Negative for vomiting.  Musculoskeletal: Negative for back pain.  Skin: Negative for rash.  Neurological: Negative for loss of consciousness and headaches.       Objective:    Physical Exam  Constitutional: She is oriented to person, place, and time. She appears well-developed and well-nourished. No distress.  HENT:  Head: Normocephalic and atraumatic.  Eyes: Conjunctivae are normal.  Neck: Normal range of motion. No thyromegaly present.    Cardiovascular: Normal rate and regular rhythm.   Pulmonary/Chest: Effort normal and breath sounds normal. She has no wheezes.  Abdominal: Soft. Bowel sounds are normal. There is no tenderness.  Musculoskeletal: Normal range of motion. She exhibits no edema or deformity.  Neurological: She is alert and oriented to person, place, and time.  Skin: Skin is warm and dry. She is not diaphoretic.  Psychiatric: She has a normal mood and affect.  BP 126/86 (BP Location: Left Arm, Cuff Size: Large)   Pulse 91   Temp 98.3 F (36.8 C) (Oral)   Resp 16   Ht 5\' 5"  (1.651 m)   Wt 199 lb 3.2 oz (90.4 kg)   SpO2 96%   BMI 33.15 kg/m  Wt Readings from Last 3 Encounters:  12/03/16 199 lb 3.2 oz (90.4 kg)  10/11/16 196 lb 9.6 oz (89.2 kg)  08/05/16 202 lb 12.8 oz (92 kg)     Lab Results  Component Value Date   WBC 4.0 07/05/2016   HGB 13.0 07/05/2016   HCT 39.5 07/05/2016   PLT 117 (L) 07/05/2016   GLUCOSE 87 07/05/2016   CHOL 182 10/28/2009   TRIG 155.0 (H) 10/28/2009   HDL 61.30 10/28/2009   LDLDIRECT 129.1 07/17/2007   LDLCALC 90 10/28/2009   ALT 9 03/07/2015   AST 21 03/07/2015   NA 141 07/05/2016   K 4.7 07/05/2016   CL 104 07/05/2016   CREATININE 0.89 07/05/2016   BUN 15 07/05/2016   CO2 31 07/05/2016   TSH 1.26 04/22/2014   INR 1.14 03/07/2015   HGBA1C 5.3 04/22/2014    Lab Results  Component Value Date   TSH 1.26 04/22/2014   Lab Results  Component Value Date   WBC 4.0 07/05/2016   HGB 13.0 07/05/2016   HCT 39.5 07/05/2016   MCV 91.0 07/05/2016   PLT 117 (L) 07/05/2016   Lab Results  Component Value Date   NA 141 07/05/2016   K 4.7 07/05/2016   CO2 31 07/05/2016   GLUCOSE 87 07/05/2016   BUN 15 07/05/2016   CREATININE 0.89 07/05/2016   BILITOT 0.9 03/07/2015   ALKPHOS 90 03/07/2015   AST 21 03/07/2015   ALT 9 03/07/2015   PROT 6.3 03/07/2015   ALBUMIN 3.2 (L) 03/07/2015   CALCIUM 9.3 07/05/2016   ANIONGAP 6 03/08/2015   GFR 73.05 04/22/2014    Lab Results  Component Value Date   CHOL 182 10/28/2009   Lab Results  Component Value Date   HDL 61.30 10/28/2009   Lab Results  Component Value Date   LDLCALC 90 10/28/2009   Lab Results  Component Value Date   TRIG 155.0 (H) 10/28/2009   Lab Results  Component Value Date   CHOLHDL 3 10/28/2009   Lab Results  Component Value Date   HGBA1C 5.3 04/22/2014       Assessment & Plan:   Problem List Items Addressed This Visit      Unprioritized   ADD (attention deficit disorder)    Ok to switch back to adderall.   rto 6 months         I have discontinued Ms. Kintzel potassium chloride SA, azithromycin, promethazine-dextromethorphan, levocetirizine, and lisdexamfetamine. I am also having her start on amphetamine-dextroamphetamine. Additionally, I am having her maintain her aspirin, Oxycodone HCl, meloxicam, SUMAtriptan, morphine, furosemide, amLODipine, tiZANidine, clonazePAM, amitriptyline, and venlafaxine XR.  Meds ordered this encounter  Medications  . amphetamine-dextroamphetamine (ADDERALL XR) 30 MG 24 hr capsule    Sig: Take 1 capsule (30 mg total) by mouth every morning.    Dispense:  30 capsule    Refill:  0    CMA served as scribe during this visit. History, Physical and Plan performed by medical provider. Documentation and orders reviewed and attested to.   Donato Schultz, DO

## 2016-12-03 NOTE — Patient Instructions (Signed)
Attention Deficit Hyperactivity Disorder, Pediatric Attention deficit hyperactivity disorder (ADHD) is a condition that can make it hard for a child to pay attention and concentrate or to control his or her behavior. The child may also have a lot of energy. ADHD is a disorder of the brain (neurodevelopmental disorder), and symptoms are typically first seen in early childhood. It is a common reason for behavioral and academic problems in school. There are three main types of ADHD:  Inattentive. With this type, children have difficulty paying attention.  Hyperactive-impulsive. With this type, children have a lot of energy and have difficulty controlling their behavior.  Combination. This type involves having symptoms of both of the other types.  ADHD is a lifelong condition. If it is not treated, the disorder can affect a child's future academic achievement, employment, and relationships. What are the causes? The exact cause of this condition is not known. What increases the risk? This condition is more likely to develop in:  Children who have a first-degree relative, such as a parent or brother or sister, with the condition.  Children who had a low birth weight.  Children whose mothers had problems during pregnancy or used alcohol or tobacco during pregnancy.  Children who have had a brain infection or a head injury.  Children who have been exposed to lead.  What are the signs or symptoms? Symptoms of this condition depend on the type of ADHD. Symptoms are listed here for each type: Inattentive  Problems with organization.  Difficulty staying focused.  Problems completing assignments at school.  Often making simple mistakes.  Problems sustaining mental effort.  Not listening to instructions.  Losing things often.  Forgetting things often.  Being easily distracted. Hyperactive-impulsive  Fidgeting often.  Difficulty sitting still in one's seat.  Talking a  lot.  Talking out of turn.  Interrupting others.  Difficulty relaxing or doing quiet activities.  High energy levels and constant movement.  Difficulty waiting.  Always "on the go." Combination  Having symptoms of both of the other types. Children with ADHD may feel frustrated with themselves and may find school to be particularly discouraging. They often perform below their abilities in school. As children get older, the excess movement can lessen, but the problems with paying attention and staying organized often continue. Most children do not outgrow ADHD, but with good treatment, they can learn to cope with the symptoms. How is this diagnosed? This condition is diagnosed based on a child's symptoms and academic history. The child's health care provider will do a complete assessment. As part of the assessment, the health care provider will ask the child questions and will ask the parents and teachers for their observations of the child. The health care provider looks for specific symptoms of ADHD. Diagnosis will include:  Ruling out other reasons for the child's behavior.  Reviewing behavior rating scales that have been filled out about the child by people who deal with the child on a daily basis.  A diagnosis is made only after all information from multiple people has been considered. How is this treated? Treatment for this condition may include:  Behavior therapy.  Medicines to decrease impulsivity and hyperactivity and to increase attention. Behavior therapy is preferred for children younger than 6 years old. The combination of medicine and behavior therapy is most effective for children older than 6 years of age.  Tutoring or extra support at school.  Techniques for parents to use at home to help manage their child's symptoms   and behavior.  Follow these instructions at home: Eating and drinking  Offer your child a well-balanced diet. Breakfast that includes a balance  of whole grains, protein, and fruits or vegetables is especially important for school performance.  If your child has trouble with hyperactivity, have your child avoid drinks that contain caffeine. These include: ? Soft drinks. ? Coffee. ? Tea.  If your child is older and finds that caffeinated drinks help to improve his or her attention, talk with your child's health care provider about what amount of caffeine intake is a safe for your child. Lifestyle   Make sure your child gets a full night of sleep and regular daily exercise.  Help manage your child's behavior by following the techniques learned in therapy. These may include: ? Looking for good behavior and rewarding it. ? Making rules for behavior that your child can understand and follow. ? Giving clear instructions. ? Responding consistently to your child's challenging behaviors. ? Setting realistic goals. ? Looking for activities that can lead to success and self-esteem. ? Making time for pleasant activities with your child. ? Giving lots of affection.  Help your child learn to be organized. Some ways to do this include: ? Keeping daily schedules the same. Have a regular wake-up time and bedtime for your child. Schedule all activities, including time for homework and time for play. Post the schedule in a place where your child will see it. Mark schedule changes in advance. ? Having a regular place for your child to store items such as clothing, backpacks, and school supplies. ? Encouraging your child to write down school assignments and to bring home needed books. Work with your child's teachers for assistance in organizing school work. General instructions  Learn as much as you can about ADHD. This will improve your ability to help your child and to make sure he or she gets the support needed. It will also help you educate your child's teachers and instructors if they do not feel that they have adequate knowledge or experience  in these areas.  Work with your child's teachers to make sure your child gets the support and extra help that is needed. This may include: ? Tutoring. ? Teacher cues to help your child remain on task. ? Seating changes so your child is working at a desk that is free from distractions.  Give over-the-counter and prescription medicines only as told by your child's health care provider.  Keep all follow-up visits as told by your health care provider. This is important. Contact a health care provider if:  Your child has repeated muscle twitches (tics), coughs, or speech outbursts.  Your child has sleep problems.  Your child has a marked loss of appetite.  Your child develops depression.  Your child has new or worsening behavioral problems.  Your child has dizziness.  Your child has a racing heart.  Your child has stomach pains.  Your child develops headaches. Get help right away if:  Your child talks about or threatens suicide.  You are worried that your child is having a bad reaction to a medicine that he or she is taking for ADHD. This information is not intended to replace advice given to you by your health care provider. Make sure you discuss any questions you have with your health care provider. Document Released: 10/15/2002 Document Revised: 06/23/2016 Document Reviewed: 05/20/2016 Elsevier Interactive Patient Education  2017 [employer]  

## 2016-12-03 NOTE — Progress Notes (Signed)
Pre visit review using our clinic review tool, if applicable. No additional management support is needed unless otherwise documented below in the visit note. 

## 2016-12-05 NOTE — Assessment & Plan Note (Signed)
Ok to switch back to adderall.   rto 6 months

## 2016-12-21 ENCOUNTER — Other Ambulatory Visit: Payer: Self-pay | Admitting: Family Medicine

## 2016-12-21 MED ORDER — SUMATRIPTAN SUCCINATE 50 MG PO TABS: 50.0000 mg | ORAL_TABLET | ORAL | 0 refills | Status: DC | PRN

## 2017-01-04 ENCOUNTER — Telehealth: Payer: Self-pay | Admitting: Family Medicine

## 2017-01-04 NOTE — Telephone Encounter (Signed)
Last refill 12/03/2016 #30 with 0 refills Last office visit on 12/03/2016 No UDS/ No Contract.

## 2017-01-04 NOTE — Telephone Encounter (Signed)
Relation to ZO:XWRUpt:self Call back number:[phone]   Reason for call:  Patient requesting a refill amphetamine-dextroamphetamine (ADDERALL XR) 30 MG 24 hr capsule

## 2017-01-06 MED ORDER — AMPHETAMINE-DEXTROAMPHET ER 30 MG PO CP24: 30.0000 mg | ORAL_CAPSULE | ORAL | 0 refills | Status: DC

## 2017-01-06 NOTE — Telephone Encounter (Signed)
Ok to refill x 1  

## 2017-01-06 NOTE — Telephone Encounter (Signed)
Printed adderall/PCP signed and patient informed  To pickup at the front desk.

## 2017-01-06 NOTE — Telephone Encounter (Signed)
Pt called in to follow up on prescription.   Please call pt when ready for pick up.

## 2017-01-11 ENCOUNTER — Encounter: Payer: Self-pay | Admitting: Family Medicine

## 2017-01-11 ENCOUNTER — Telehealth: Payer: Self-pay | Admitting: Family Medicine

## 2017-01-11 DIAGNOSIS — F411 Generalized anxiety disorder: Secondary | ICD-10-CM

## 2017-01-11 MED ORDER — CLONAZEPAM 0.5 MG PO TABS: ORAL_TABLET | ORAL | 2 refills | Status: DC

## 2017-01-11 NOTE — Telephone Encounter (Signed)
Requesting:   clonazepam Contract   None UDS    None Last OV    12/03/2016---no scheduled upcoming Last Refill    #90 with 2 refills on 10/11/2016  Please Advise----[facility].

## 2017-01-11 NOTE — Telephone Encounter (Signed)
Ok to refill  Need uds and contract 

## 2017-01-11 NOTE — Telephone Encounter (Signed)
Patient informed hardcopy is ready/complete contract and do UDS.

## 2017-01-11 NOTE — Telephone Encounter (Signed)
Patient called back to follow up on request and was informed that she needs to come into the office to pick up the Rx. Was not informed that she needs to have a UDS.

## 2017-01-11 NOTE — Telephone Encounter (Signed)
Printed prescripiton/contract/PCP signed. Called the patient left a message to call back.  (she will need to come in the office to sign contract/uds then can get script)

## 2017-01-18 ENCOUNTER — Other Ambulatory Visit: Payer: Self-pay | Admitting: Family Medicine

## 2017-01-18 MED ORDER — SUMATRIPTAN SUCCINATE 50 MG PO TABS: 50.0000 mg | ORAL_TABLET | ORAL | 1 refills | Status: DC | PRN

## 2017-01-19 NOTE — Progress Notes (Deleted)
Subjective:   [name] is a 71 y.o. female who presents for an Initial Medicare Annual Wellness Visit.  Review of Systems    No ROS.  Medicare Wellness Visit.    Sleep patterns: Home Safety/Smoke Alarms:   Living environment; residence and Firearm Safety:  Seat Belt Safety/Bike Helmet: Wears seat belt.   Counseling:   Eye Exam-  Dental-  Female:   Pap-  Hysterectomy.     Mammo- Last 04/10/13: BI-RADS CATEGORY 1:  Negative.      Dexa scan- Last 04/09/13: osteoporosis CCS- Last 02/28/03: Normal     Objective:    There were no vitals filed for this visit. There is no height or weight on file to calculate BMI.   Current Medications (verified) Outpatient Encounter Prescriptions as of 01/21/2017  Medication Sig  . amitriptyline (ELAVIL) 75 MG tablet Take 1 tablet (75 mg total) by mouth daily.  [name] amLODipine (NORVASC) 5 MG tablet Take 1 tablet (5 mg total) by mouth daily.  [name] amphetamine-dextroamphetamine (ADDERALL XR) 30 MG 24 hr capsule Take 1 capsule (30 mg total) by mouth every morning.  [name] aspirin 81 MG tablet Take 81 mg by mouth daily.  . clonazePAM (KLONOPIN) 0.5 MG tablet TAKE ONE (1) TABLET BY MOUTH 3 TIMES DAILY AS NEEDED FOR ANXIETY  . furosemide (LASIX) 40 MG tablet Take 40 mg by mouth daily as needed for edema.  . meloxicam (MOBIC) 15 MG tablet Take 1 tablet by mouth daily.  [name] morphine (MSIR) 15 MG tablet Take 15 mg by mouth every 12 (twelve) hours as needed for severe pain (for pain).  . Oxycodone HCl 20 MG TABS Take 1 tablet by mouth 4 (four) times daily as needed (pain).   . SUMAtriptan (IMITREX) 50 MG tablet Take 1 tablet (50 mg total) by mouth every 2 (two) hours as needed for migraine. May repeat in 2 hours if headache persists or recurs.  [name] tiZANidine (ZANAFLEX) 4 MG capsule Take 4 mg by mouth 3 (three) times daily as needed for muscle spasms.  [name] venlafaxine XR (EFFEXOR-XR) 75 MG 24 hr capsule Take 3 capsules (225 mg total) by mouth daily.   No  facility-administered encounter medications on file as of 01/21/2017.     Allergies (verified) Patient has no known allergies.   History: Past Medical History:  Diagnosis Date  . ADD (attention deficit disorder) 05/24/2012  . ANEMIA, MILD 06/26/2008   Qualifier: Diagnosis of  By: Janit Bern    . Anxiety   . Arthritis    "joints, back" (10/02/2013)  . Chronic back pain    "all over my back" (10/02/2013)  . Congestive heart disease (HCC)    "I have the beginning signs" (10/02/2013)  . Crushing injury of arm, right 02/06/1989's   "it was crushed; wore cast from fingers to top of my shoulder" (11/25/2  . Crushing injury of left wrist 05/11/1989's  . DIZZINESS 12/31/2009   Qualifier: Diagnosis of  By: Oswald Hillock    . Fibromyalgia   . GERD 12/03/2008   Qualifier: Diagnosis of  By: Freddy Jaksch    . HYPERLIPIDEMIA 05/15/2007   Qualifier: Diagnosis of  By: Janit Bern ; pt denies this hx on 10/02/2013  . Hypertension   . Idiopathic scoliosis 04/19/2013  . Migraines    "once/month" (10/02/2013)  . Osteoporosis, unspecified 04/19/2013  . Other vitamin B12 deficiency anemia 10/30/2009   Qualifier: Diagnosis of  By: Floydene Flock    . PALPITATIONS, RECURRENT 12/12/2009  Qualifier: Diagnosis of  By: Janit BernLowne DO, Yvonne    . Pica 12/28/2007   Qualifier: Diagnosis of  By: Janit BernLowne DO, Yvonne    . Pneumonia 10/02/2013   "had onsets of it before; not full blown til now" (10/02/2013)  . PSTPRC STATUS, BARIATRIC SURGERY 05/15/2007   Qualifier: Diagnosis of  By: Janit BernLowne DO, Yvonne    . Pulmonary nodules/lesions, multiple   . TREMOR, ESSENTIAL 12/12/2009   Qualifier: Diagnosis of  By: Janit BernLowne DO, Yvonne    . Unspecified Myalgia and Myositis 05/15/2007   Centricity Description: FIBROMYALGIA Qualifier: Diagnosis of  By: Janit BernLowne DO, Yvonne   Centricity Description: MYALGIA Qualifier: Diagnosis of  By: Janit BernLowne DO, Yvonne     Past Surgical History:  Procedure Laterality Date  . ABDOMINAL HYSTERECTOMY   1980's  . APPENDECTOMY  1980's   "w/either my hysterectomy or myomectomy" (10/02/2013)  . BACK SURGERY    . CLOSED REDUCTION HAND FRACTURE Right 1990's   "it was crushed; wore cast from fingers to top of my shoulder" (10/02/2013)  . GASTRIC BYPASS  ~ 2003  . KNEE ARTHROSCOPY Right 2002  . MYOMECTOMY  1980's  . REDUCTION MAMMAPLASTY Bilateral 2001  . REFRACTIVE SURGERY Bilateral 2004  . SPINAL CORD STIMULATOR IMPLANT    . TONSILLECTOMY AND ADENOIDECTOMY  1955  . TUBAL LIGATION  1980's   Family History  Problem Relation Age of Onset  . Adopted: Yes  . COPD Mother   . Emphysema Mother   . Heart attack Mother   . Other Mother     tobacco abuse   Social History   Occupational History  . Retired    Social History Main Topics  . Smoking status: Never Smoker  . Smokeless tobacco: Never Used  . Alcohol use No  . Drug use: No  . Sexual activity: No    Tobacco Counseling Counseling given: Not Answered   Activities of Daily Living No flowsheet data found.  Immunizations and Health Maintenance Immunization History  Administered Date(s) Administered  . Influenza, High Dose Seasonal PF 09/03/2016  . Influenza,inj,Quad PF,36+ Mos 10/15/2013, 11/28/2014  . Pneumococcal Conjugate-13 10/15/2013  . Pneumococcal Polysaccharide-23 12/05/2014  . Td 10/28/2009  . Zoster 10/28/2009   Health Maintenance Due  Topic Date Due  . COLONOSCOPY  02/27/2013  . MAMMOGRAM  04/10/2015    Patient Care Team: Donato SchultzYvonne R Lowne Chase, DO as PCP - General  Indicate any recent Medical Services you may have received from other than [facility] providers in the past year (date may be approximate).     Assessment:   This is a routine wellness examination for [name]. Physical assessment deferred to PCP.  Hearing/Vision screen No exam data present  Dietary issues and exercise activities discussed:   Diet (meal preparation, eat out, water intake, caffeinated beverages, dairy products, fruits and  vegetables): {Desc; diets:16563} Breakfast: Lunch:  Dinner:      Goals    None     Depression Screen PHQ 2/9 Scores 12/03/2016 10/11/2016 07/01/2015 04/22/2014  PHQ - 2 Score 0 0 0 0    Fall Risk Fall Risk  12/03/2016 10/11/2016 07/01/2015 04/22/2014  Falls in the past year? Yes Yes Yes No  Number falls in past yr: 2 or more 2 or more 1 -  Injury with Fall? No (No Data) Yes -  Risk for fall due to : - - Impaired balance/gait -    Cognitive Function:   Montreal Cognitive Assessment  01/15/2015  Visuospatial/ Executive (0/5) 5  Naming (0/3)  3  Attention: Read list of digits (0/2) 2  Attention: Read list of letters (0/1) 1  Attention: Serial 7 subtraction starting at 100 (0/3) 3  Language: Repeat phrase (0/2) 2  Language : Fluency (0/1) 1  Abstraction (0/2) 2  Delayed Recall (0/5) 5  Orientation (0/6) 5  Total 29  Adjusted Score (based on education) 29      Screening Tests Health Maintenance  Topic Date Due  . COLONOSCOPY  02/27/2013  . MAMMOGRAM  04/10/2015  . TETANUS/TDAP  10/29/2019  . INFLUENZA VACCINE  Completed  . DEXA SCAN  Completed  . Hepatitis C Screening  Completed  . PNA vac Low Risk Adult  Completed      Plan:   ***  During the course of the visit, Raziya was educated and counseled about the following appropriate screening and preventive services:   Vaccines to include Pneumoccal, Influenza, Hepatitis B, Td, , HCV  Cardiovascular disease screening  Colorectal cancer screening  Bone density screening  Diabetes screening  Glaucoma screening  Mammography/PAP  Nutrition counseling  Smoking cessation counseling  Patient Instructions (the written plan) were given to the patient.    [place]on Gully, [place]   01/19/2017

## 2017-01-19 NOTE — Progress Notes (Deleted)
Pre visit review using our clinic review tool, if applicable. No additional management support is needed unless otherwise documented below in the visit note. 

## 2017-01-21 ENCOUNTER — Ambulatory Visit: Payer: Medicare Other | Admitting: *Deleted

## 2017-02-07 ENCOUNTER — Telehealth: Payer: Self-pay | Admitting: Family Medicine

## 2017-02-07 MED ORDER — AMPHETAMINE-DEXTROAMPHET ER 30 MG PO CP24: 30.0000 mg | ORAL_CAPSULE | ORAL | 0 refills | Status: DC

## 2017-02-07 NOTE — Telephone Encounter (Signed)
Requesting:  Adderall Contract   Signed on 01/14/2017 UDS   none Last OV   12/03/2016 Last Refill   Adderall    #30 no refills on 01/06/2017

## 2017-02-07 NOTE — Telephone Encounter (Signed)
Printed/pcp informed Called the patient informed to pickup hardcopy at the front desk.

## 2017-02-07 NOTE — Telephone Encounter (Signed)
Refill x1 

## 2017-02-07 NOTE — Telephone Encounter (Signed)
Caller name: Brooklynne Relation to pt: self Call back number: [phone] Pharmacy:  Reason for call: Pt is requesting refill on amphetamine-dextroamphetamine (ADDERALL XR) 30 MG 24 hr capsule and also clonazePAM (KLONOPIN) 0.5 MG tablet. Please advise ASAP. Pt did not realize that Friday we were off.

## 2017-02-21 NOTE — Progress Notes (Deleted)
Subjective:   [name] is a 71 y.o. female who presents for an Initial Medicare Annual Wellness Visit.  The Patient was informed that the wellness visit is to identify future health risk and educate and initiate measures that can reduce risk for increased disease through the lifespan.   Describes health as fair, good or great?  Review of Systems    No ROS.  Medicare Wellness Visit.     Sleep patterns: {SX; SLEEP PATTERNS:18802::"feels rested on waking","does not get up to void","gets up *** times nightly to void","sleeps *** hours nightly"}.   Home Safety/Smoke Alarms:   Living environment; residence and Firearm Safety: {Rehab home environment / accessibility:30080::"no firearms","firearms stored safely"}. Seat Belt Safety/Bike Helmet: Wears seat belt.   Counseling:   Eye Exam-  Dental-  Female:   Pap- N/A, hysterectomy     Mammo- last 04/09/13. BI-RADS CATEGORY 1:  Negative.         Dexa scan- last 04/09/13. Osteoporosis.      CCS- last 02/28/03. Normal.     Objective:    There were no vitals filed for this visit. There is no height or weight on file to calculate BMI.   Current Medications (verified) Outpatient Encounter Prescriptions as of 02/22/2017  Medication Sig  . amitriptyline (ELAVIL) 75 MG tablet Take 1 tablet (75 mg total) by mouth daily.  [name] amLODipine (NORVASC) 5 MG tablet Take 1 tablet (5 mg total) by mouth daily.  [name] amphetamine-dextroamphetamine (ADDERALL XR) 30 MG 24 hr capsule Take 1 capsule (30 mg total) by mouth every morning.  [name] aspirin 81 MG tablet Take 81 mg by mouth daily.  . clonazePAM (KLONOPIN) 0.5 MG tablet TAKE ONE (1) TABLET BY MOUTH 3 TIMES DAILY AS NEEDED FOR ANXIETY  . furosemide (LASIX) 40 MG tablet Take 40 mg by mouth daily as needed for edema.  . meloxicam (MOBIC) 15 MG tablet Take 1 tablet by mouth daily.  [name] morphine (MSIR) 15 MG tablet Take 15 mg by mouth every 12 (twelve) hours as needed for severe pain (for pain).  . Oxycodone HCl  20 MG TABS Take 1 tablet by mouth 4 (four) times daily as needed (pain).   . SUMAtriptan (IMITREX) 50 MG tablet Take 1 tablet (50 mg total) by mouth every 2 (two) hours as needed for migraine. May repeat in 2 hours if headache persists or recurs.  [name] tiZANidine (ZANAFLEX) 4 MG capsule Take 4 mg by mouth 3 (three) times daily as needed for muscle spasms.  [name] venlafaxine XR (EFFEXOR-XR) 75 MG 24 hr capsule Take 3 capsules (225 mg total) by mouth daily.   No facility-administered encounter medications on file as of 02/22/2017.     Allergies (verified) Patient has no known allergies.   History: Past Medical History:  Diagnosis Date  . ADD (attention deficit disorder) 05/24/2012  . ANEMIA, MILD 06/26/2008   Qualifier: Diagnosis of  By: Janit Bern    . Anxiety   . Arthritis    "joints, back" (10/02/2013)  . Chronic back pain    "all over my back" (10/02/2013)  . Congestive heart disease (HCC)    "I have the beginning signs" (10/02/2013)  . Crushing injury of arm, right 02/06/1989's   "it was crushed; wore cast from fingers to top of my shoulder" (11/25/2  . Crushing injury of left wrist 05/11/1989's  . DIZZINESS 12/31/2009   Qualifier: Diagnosis of  By: Oswald Hillock    . Fibromyalgia   . GERD 12/03/2008  Qualifier: Diagnosis of  By: Freddy Jaksch    . HYPERLIPIDEMIA 05/15/2007   Qualifier: Diagnosis of  By: Janit Bern ; pt denies this hx on 10/02/2013  . Hypertension   . Idiopathic scoliosis 04/19/2013  . Migraines    "once/month" (10/02/2013)  . Osteoporosis, unspecified 04/19/2013  . Other vitamin B12 deficiency anemia 10/30/2009   Qualifier: Diagnosis of  By: Floydene Flock    . PALPITATIONS, RECURRENT 12/12/2009   Qualifier: Diagnosis of  By: Janit Bern    . Pica 12/28/2007   Qualifier: Diagnosis of  By: Janit Bern    . Pneumonia 10/02/2013   "had onsets of it before; not full blown til now" (10/02/2013)  . PSTPRC STATUS, BARIATRIC SURGERY 05/15/2007    Qualifier: Diagnosis of  By: Janit Bern    . Pulmonary nodules/lesions, multiple   . TREMOR, ESSENTIAL 12/12/2009   Qualifier: Diagnosis of  By: Janit Bern    . Unspecified Myalgia and Myositis 05/15/2007   Centricity Description: FIBROMYALGIA Qualifier: Diagnosis of  By: Janit Bern   Centricity Description: MYALGIA Qualifier: Diagnosis of  By: Janit Bern     Past Surgical History:  Procedure Laterality Date  . ABDOMINAL HYSTERECTOMY  1980's  . APPENDECTOMY  1980's   "w/either my hysterectomy or myomectomy" (10/02/2013)  . BACK SURGERY    . CLOSED REDUCTION HAND FRACTURE Right 1990's   "it was crushed; wore cast from fingers to top of my shoulder" (10/02/2013)  . GASTRIC BYPASS  ~ 2003  . KNEE ARTHROSCOPY Right 2002  . MYOMECTOMY  1980's  . REDUCTION MAMMAPLASTY Bilateral 2001  . REFRACTIVE SURGERY Bilateral 2004  . SPINAL CORD STIMULATOR IMPLANT    . TONSILLECTOMY AND ADENOIDECTOMY  1955  . TUBAL LIGATION  1980's   Family History  Problem Relation Age of Onset  . Adopted: Yes  . COPD Mother   . Emphysema Mother   . Heart attack Mother   . Other Mother     tobacco abuse   Social History   Occupational History  . Retired    Social History Main Topics  . Smoking status: Never Smoker  . Smokeless tobacco: Never Used  . Alcohol use No  . Drug use: No  . Sexual activity: No    Tobacco Counseling Counseling given: Not Answered   Activities of Daily Living No flowsheet data found.  Immunizations and Health Maintenance Immunization History  Administered Date(s) Administered  . Influenza, High Dose Seasonal PF 09/03/2016  . Influenza,inj,Quad PF,36+ Mos 10/15/2013, 11/28/2014  . Pneumococcal Conjugate-13 10/15/2013  . Pneumococcal Polysaccharide-23 12/05/2014  . Td 10/28/2009  . Zoster 10/28/2009   Health Maintenance Due  Topic Date Due  . COLONOSCOPY  02/27/2013  . MAMMOGRAM  04/10/2015    Patient Care Team: Donato Schultz, DO as  PCP - General  Indicate any recent Medical Services you may have received from other than [facility] providers in the past year (date may be approximate).     Assessment:   This is a routine wellness examination for [name]. Physical assessment deferred to PCP.  Hearing/Vision screen No exam data present  Dietary issues and exercise activities discussed:    Diet (meal preparation, eat out, water intake, caffeinated beverages, dairy products, fruits and vegetables): {Desc; diets:16563} Breakfast: Lunch:  Dinner:      Goals    None     Depression Screen PHQ 2/9 Scores 12/03/2016 10/11/2016 07/01/2015 04/22/2014  PHQ - 2 Score 0 0  0 0    Fall Risk Fall Risk  12/03/2016 10/11/2016 07/01/2015 04/22/2014  Falls in the past year? Yes Yes Yes No  Number falls in past yr: 2 or more 2 or more 1 -  Injury with Fall? No (No Data) Yes -  Risk for fall due to : - - Impaired balance/gait -    Cognitive Function:   Montreal Cognitive Assessment  01/15/2015  Visuospatial/ Executive (0/5) 5  Naming (0/3) 3  Attention: Read list of digits (0/2) 2  Attention: Read list of letters (0/1) 1  Attention: Serial 7 subtraction starting at 100 (0/3) 3  Language: Repeat phrase (0/2) 2  Language : Fluency (0/1) 1  Abstraction (0/2) 2  Delayed Recall (0/5) 5  Orientation (0/6) 5  Total 29  Adjusted Score (based on education) 29      Screening Tests Health Maintenance  Topic Date Due  . COLONOSCOPY  02/27/2013  . MAMMOGRAM  04/10/2015  . INFLUENZA VACCINE  06/08/2017  . TETANUS/TDAP  10/29/2019  . DEXA SCAN  Completed  . Hepatitis C Screening  Completed  . PNA vac Low Risk Adult  Completed      Plan:    Follow-up w/ PCP as scheduled.  During the course of the visit, Alaisa was educated and counseled about the following appropriate screening and preventive services:   Vaccines to include Pneumococcal, Influenza, Td, HCV  Cardiovascular disease screening  Colorectal cancer screening  Bone  density screening  Diabetes screening  Glaucoma screening  Mammography  Nutrition counseling  Smoking cessation counseling  Patient Instructions (the written plan) were given to the patient.    Starla Link, RN   02/21/2017

## 2017-02-21 NOTE — Progress Notes (Deleted)
Pre visit review using our clinic review tool, if applicable. No additional management support is needed unless otherwise documented below in the visit note. 

## 2017-02-22 ENCOUNTER — Ambulatory Visit: Payer: Medicare Other | Admitting: *Deleted

## 2017-02-24 ENCOUNTER — Ambulatory Visit: Payer: Medicare Other | Admitting: *Deleted

## 2017-03-10 ENCOUNTER — Telehealth: Payer: Self-pay | Admitting: Family Medicine

## 2017-03-10 MED ORDER — AMPHETAMINE-DEXTROAMPHET ER 30 MG PO CP24: 30.0000 mg | ORAL_CAPSULE | ORAL | 0 refills | Status: DC

## 2017-03-10 NOTE — Telephone Encounter (Signed)
Printed/pcp signed Called the patient informed to pickup hardcopy at the front desk. 

## 2017-03-10 NOTE — Telephone Encounter (Signed)
Requesting:  adderalll Contract   None UDS     None Last OV      12/03/2016 Last Refill   #30 on 02/07/17  Please Advise

## 2017-03-10 NOTE — Telephone Encounter (Signed)
Self.   Refill request for Adderall   CB: [phone]

## 2017-03-10 NOTE — Telephone Encounter (Signed)
refill 

## 2017-03-28 ENCOUNTER — Other Ambulatory Visit: Payer: Self-pay | Admitting: Family Medicine

## 2017-03-28 ENCOUNTER — Telehealth: Payer: Self-pay

## 2017-03-28 DIAGNOSIS — F411 Generalized anxiety disorder: Secondary | ICD-10-CM

## 2017-03-28 MED ORDER — VENLAFAXINE HCL ER 75 MG PO CP24: 225.0000 mg | ORAL_CAPSULE | Freq: Every day | ORAL | 0 refills | Status: DC

## 2017-03-28 NOTE — Telephone Encounter (Signed)
Rx was already sent to the pharmacy.//AB/CMA

## 2017-03-28 NOTE — Telephone Encounter (Signed)
Refill 6 months 

## 2017-03-28 NOTE — Telephone Encounter (Signed)
Spouse Checking status of refill

## 2017-03-28 NOTE — Telephone Encounter (Signed)
Caller name: Jonny RuizJohn Relationship to patient: Husband Can be reached: [phone]  Pharmacy:  [facility] [address] - [place], [place] - [phone] [address] [phone] (Phone) [phone] (Fax)   Reason for call: Refill venlafaxine XR (EFFEXOR-XR) 75 MG 24 hr capsule   [place] and ran [place] of medicine. Needs rx sent to above pharmacy

## 2017-03-28 NOTE — Telephone Encounter (Signed)
Sent in prescription and patient notified. 

## 2017-04-07 ENCOUNTER — Telehealth: Payer: Self-pay | Admitting: Family Medicine

## 2017-04-07 MED ORDER — AMPHETAMINE-DEXTROAMPHET ER 30 MG PO CP24: 30.0000 mg | ORAL_CAPSULE | ORAL | 0 refills | Status: DC

## 2017-04-07 NOTE — Telephone Encounter (Signed)
Patient did prefer 3 months at a time Did print additional 2 months She will pickup at the front desk at her conveninence

## 2017-04-07 NOTE — Telephone Encounter (Signed)
Printed/pcp signed Called the patient left message to call bavk.

## 2017-04-07 NOTE — Telephone Encounter (Signed)
Ok to refill 

## 2017-04-07 NOTE — Telephone Encounter (Signed)
Requesting:   adderall Contract   12/03/2016 UDS   none Last OV    12/03/2016 Last Refill    #30 on 03/10/2017  Please Advise

## 2017-04-07 NOTE — Telephone Encounter (Signed)
Patient needs refill on amphetamine-dextroamphetamine (ADDERALL XR) 30 MG 24 hr capsule call when ready to pick up   [phone]

## 2017-04-14 ENCOUNTER — Telehealth: Payer: Self-pay | Admitting: Family Medicine

## 2017-04-14 DIAGNOSIS — F411 Generalized anxiety disorder: Secondary | ICD-10-CM

## 2017-04-14 NOTE — Telephone Encounter (Signed)
Caller name: Relationship to patient: Self Can be reached:  [phone]  Pharmacy:  [facility] - [place], [place] - [phone] (Phone) [phone] (Fax)     Reason for call: Refill clonazePAM (KLONOPIN) 0.5 MG tablet [[id number]

## 2017-04-15 MED ORDER — CLONAZEPAM 0.5 MG PO TABS: ORAL_TABLET | ORAL | 2 refills | Status: DC

## 2017-04-15 NOTE — Telephone Encounter (Signed)
x1

## 2017-04-15 NOTE — Telephone Encounter (Signed)
rx faxed to [facility].  lmom that rx was sent in.

## 2017-04-15 NOTE — Telephone Encounter (Signed)
Requesting:   Clonazepam  Contract   01/12/17 UDS    none Last OV    12/03/2016 Last Refill   #90 with 2 refills on 01/11/17  Please Advise

## 2017-04-28 ENCOUNTER — Telehealth: Payer: Self-pay | Admitting: Family Medicine

## 2017-04-28 DIAGNOSIS — F411 Generalized anxiety disorder: Secondary | ICD-10-CM

## 2017-04-28 NOTE — Telephone Encounter (Signed)
Caller name: Relationship to patient: Self Can be reached: [phone]  Pharmacy:  [facility] - [place], [place] - [address] [phone] (Phone) [phone])   Reason for call: Refill venlafaxine XR (EFFEXOR-XR) 75 MG 24 hr capsule

## 2017-04-29 MED ORDER — VENLAFAXINE HCL ER 75 MG PO CP24: 225.0000 mg | ORAL_CAPSULE | Freq: Every day | ORAL | 0 refills | Status: DC

## 2017-04-29 NOTE — Telephone Encounter (Signed)
30 day supply sent to [facility], Pt overdue for appt w/ PCP. Please schedule at her convenience. Thank you.

## 2017-05-03 ENCOUNTER — Other Ambulatory Visit: Payer: Self-pay | Admitting: *Deleted

## 2017-05-03 MED ORDER — SUMATRIPTAN SUCCINATE 50 MG PO TABS: 50.0000 mg | ORAL_TABLET | ORAL | 0 refills | Status: DC | PRN

## 2017-05-03 NOTE — Telephone Encounter (Signed)
Left message on patients voicemail for her to contact the office to schedule appointment.

## 2017-05-04 ENCOUNTER — Other Ambulatory Visit: Payer: Self-pay | Admitting: Family Medicine

## 2017-05-04 NOTE — Telephone Encounter (Signed)
Caller name: Relation to AO:ZHYQpt:self Call back [phone] Pharmacy:  Reason for call: pt is needing rx amphetamine-dextroamphetamine (ADDERALL XR) 30 MG 24 hr capsule. Please call when available and ready for pick up.

## 2017-05-04 NOTE — Telephone Encounter (Signed)
Request for adderall  Last filled per database:  04/08/17 Last written: 04/07/17 Last ov: 12/03/16 Next ov: Nothing scheduled, due around 06/02/17 Contract: 01/11/17 UDS: uds given 01/11/17 but no results in epic  Database at your desk.

## 2017-05-05 MED ORDER — AMPHETAMINE-DEXTROAMPHET ER 30 MG PO CP24: 30.0000 mg | ORAL_CAPSULE | ORAL | 0 refills | Status: DC

## 2017-05-05 NOTE — Telephone Encounter (Signed)
Ok to refill 3 months  

## 2017-05-06 NOTE — Telephone Encounter (Signed)
Patient picked up rx at front desk

## 2017-05-31 ENCOUNTER — Telehealth: Payer: Self-pay | Admitting: Family Medicine

## 2017-05-31 DIAGNOSIS — F411 Generalized anxiety disorder: Secondary | ICD-10-CM

## 2017-05-31 MED ORDER — VENLAFAXINE HCL ER 75 MG PO CP24: 225.0000 mg | ORAL_CAPSULE | Freq: Every day | ORAL | 0 refills | Status: DC

## 2017-05-31 NOTE — Telephone Encounter (Signed)
Left message on machine that rx for effexor was sent to pharmacy

## 2017-05-31 NOTE — Telephone Encounter (Signed)
Caller name: Marge DuncansRita Mumme Relationship to patient: self Can be reached: [phone] Pharmacy: [facility] - [place], [place] - [address]  Reason for call: Pt is needing refill on effexor. She has 2 pills left. Please send in. Pt asked for adderall refill. I advised she received 3 Rx's last month for 6/28, 7/28, and 8/28. Pt will check with pharmacy.

## 2017-07-04 ENCOUNTER — Ambulatory Visit (INDEPENDENT_AMBULATORY_CARE_PROVIDER_SITE_OTHER): Payer: Medicare Other | Admitting: Family Medicine

## 2017-07-04 ENCOUNTER — Ambulatory Visit: Payer: Medicare Other | Admitting: Family Medicine

## 2017-07-04 ENCOUNTER — Encounter: Payer: Self-pay | Admitting: Family Medicine

## 2017-07-04 VITALS — BP 122/64 | HR 85 | Temp 97.5°F | Wt 186.4 lb

## 2017-07-04 DIAGNOSIS — R51 Headache: Secondary | ICD-10-CM

## 2017-07-04 DIAGNOSIS — R519 Headache, unspecified: Secondary | ICD-10-CM

## 2017-07-04 DIAGNOSIS — F988 Other specified behavioral and emotional disorders with onset usually occurring in childhood and adolescence: Secondary | ICD-10-CM

## 2017-07-04 DIAGNOSIS — F411 Generalized anxiety disorder: Secondary | ICD-10-CM | POA: Diagnosis not present

## 2017-07-04 DIAGNOSIS — Z23 Encounter for immunization: Secondary | ICD-10-CM

## 2017-07-04 MED ORDER — AMPHETAMINE-DEXTROAMPHET ER 30 MG PO CP24: 30.0000 mg | ORAL_CAPSULE | ORAL | 0 refills | Status: DC

## 2017-07-04 MED ORDER — SUMATRIPTAN SUCCINATE 50 MG PO TABS: 50.0000 mg | ORAL_TABLET | ORAL | 0 refills | Status: DC | PRN

## 2017-07-04 MED ORDER — VENLAFAXINE HCL ER 75 MG PO CP24: 225.0000 mg | ORAL_CAPSULE | Freq: Every day | ORAL | 0 refills | Status: DC

## 2017-07-04 NOTE — Patient Instructions (Signed)

## 2017-07-04 NOTE — Assessment & Plan Note (Signed)
Stable Con' t immitrex and elavil

## 2017-07-04 NOTE — Progress Notes (Signed)
Patient ID: [name], female    DOB: 1946-08-31  Age: 71 y.o. MRN: [id number]    Subjective:  Subjective  HPI [name] presents for f/u anxiety and add.  Pt is stressed due to having to take are of 6yo great grandchild.    Review of Systems  Constitutional: Negative for appetite change, diaphoresis, fatigue and unexpected weight change.  Eyes: Negative for pain, redness and visual disturbance.  Respiratory: Negative for cough, chest tightness, shortness of breath and wheezing.   Cardiovascular: Negative for chest pain, palpitations and leg swelling.  Endocrine: Negative for cold intolerance, heat intolerance, polydipsia, polyphagia and polyuria.  Genitourinary: Negative for difficulty urinating, dysuria and frequency.  Neurological: Negative for dizziness, light-headedness, numbness and headaches.  Psychiatric/Behavioral: Negative for decreased concentration. The patient is nervous/anxious.     History Past Medical History:  Diagnosis Date  . ADD (attention deficit disorder) 05/24/2012  . ANEMIA, MILD 06/26/2008   Qualifier: Diagnosis of  By: Janit Bern    . Anxiety   . Arthritis    "joints, back" (10/02/2013)  . Chronic back pain    "all over my back" (10/02/2013)  . Congestive heart disease (HCC)    "I have the beginning signs" (10/02/2013)  . Crushing injury of arm, right 02/06/1989's   "it was crushed; wore cast from fingers to top of my shoulder" (11/25/2  . Crushing injury of left wrist 05/11/1989's  . DIZZINESS 12/31/2009   Qualifier: Diagnosis of  By: Oswald Hillock    . Fibromyalgia   . GERD 12/03/2008   Qualifier: Diagnosis of  By: Freddy Jaksch    . HYPERLIPIDEMIA 05/15/2007   Qualifier: Diagnosis of  By: Janit Bern ; pt denies this hx on 10/02/2013  . Hypertension   . Idiopathic scoliosis 04/19/2013  . Migraines    "once/month" (10/02/2013)  . Osteoporosis, unspecified 04/19/2013  . Other vitamin B12 deficiency anemia 10/30/2009   Qualifier:  Diagnosis of  By: Floydene Flock    . PALPITATIONS, RECURRENT 12/12/2009   Qualifier: Diagnosis of  By: Janit Bern    . Pica 12/28/2007   Qualifier: Diagnosis of  By: Janit Bern    . Pneumonia 10/02/2013   "had onsets of it before; not full blown til now" (10/02/2013)  . PSTPRC STATUS, BARIATRIC SURGERY 05/15/2007   Qualifier: Diagnosis of  By: Janit Bern    . Pulmonary nodules/lesions, multiple   . TREMOR, ESSENTIAL 12/12/2009   Qualifier: Diagnosis of  By: Janit Bern    . Unspecified Myalgia and Myositis 05/15/2007   Centricity Description: FIBROMYALGIA Qualifier: Diagnosis of  By: Janit Bern   Centricity Description: MYALGIA Qualifier: Diagnosis of  By: Janit Bern      She has a past surgical history that includes Gastric bypass (~ 2003); Abdominal hysterectomy (1980's); Knee arthroscopy (Right, 2002); Tonsillectomy and adenoidectomy (1955); Myomectomy (1980's); Appendectomy (1980's); Tubal ligation (1980's); Reduction mammaplasty (Bilateral, 2001); Refractive surgery (Bilateral, 2004); Closed reduction hand fracture (Right, 1990's); Back surgery; and Spinal cord stimulator implant.   Her family history includes COPD in her mother; Emphysema in her mother; Heart attack in her mother; Other in her mother. She was adopted.She reports that she has never smoked. She has never used smokeless tobacco. She reports that she does not drink alcohol or use drugs.  Current Outpatient Prescriptions on File Prior to Visit  Medication Sig Dispense Refill  . amitriptyline (ELAVIL) 75 MG tablet Take 1 tablet (75 mg total) by  mouth daily. 90 tablet 1  . amLODipine (NORVASC) 5 MG tablet Take 1 tablet (5 mg total) by mouth daily. 90 tablet 3  . aspirin 81 MG tablet Take 81 mg by mouth daily.    . clonazePAM (KLONOPIN) 0.5 MG tablet TAKE ONE (1) TABLET BY MOUTH 3 TIMES DAILY AS NEEDED FOR ANXIETY 90 tablet 2  . furosemide (LASIX) 40 MG tablet Take 40 mg by mouth daily as needed for  edema.    [name] morphine (MSIR) 15 MG tablet Take 15 mg by mouth every 12 (twelve) hours as needed for severe pain (for pain).    . Oxycodone HCl 20 MG TABS Take 1 tablet by mouth 4 (four) times daily as needed (pain).     [name] tiZANidine (ZANAFLEX) 4 MG capsule Take 4 mg by mouth 3 (three) times daily as needed for muscle spasms.     No current facility-administered medications on file prior to visit.      Objective:  Objective  Physical Exam  Constitutional: She is oriented to person, place, and time. She appears well-developed and well-nourished.  HENT:  Head: Normocephalic and atraumatic.  Eyes: Conjunctivae and EOM are normal.  Neck: Normal range of motion. Neck supple. No JVD present. Carotid bruit is not present. No thyromegaly present.  Cardiovascular: Normal rate, regular rhythm and normal heart sounds.   No murmur heard. Pulmonary/Chest: Effort normal and breath sounds normal. No respiratory distress. She has no wheezes. She has no rales. She exhibits no tenderness.  Musculoskeletal: She exhibits no edema.  Neurological: She is alert and oriented to person, place, and time.  Psychiatric: She has a normal mood and affect.  Nursing note and vitals reviewed.  BP 122/64   Pulse 85   Temp (!) 97.5 F (36.4 C) (Oral)   Wt 186 lb 6.4 oz (84.6 kg)   SpO2 93%   BMI 31.02 kg/m  Wt Readings from Last 3 Encounters:  07/04/17 186 lb 6.4 oz (84.6 kg)  12/03/16 199 lb 3.2 oz (90.4 kg)  10/11/16 196 lb 9.6 oz (89.2 kg)     Lab Results  Component Value Date   WBC 4.0 07/05/2016   HGB 13.0 07/05/2016   HCT 39.5 07/05/2016   PLT 117 (L) 07/05/2016   GLUCOSE 87 07/05/2016   CHOL 182 10/28/2009   TRIG 155.0 (H) 10/28/2009   HDL 61.30 10/28/2009   LDLDIRECT 129.1 07/17/2007   LDLCALC 90 10/28/2009   ALT 9 03/07/2015   AST 21 03/07/2015   NA 141 07/05/2016   K 4.7 07/05/2016   CL 104 07/05/2016   CREATININE 0.89 07/05/2016   BUN 15 07/05/2016   CO2 31 07/05/2016   TSH 1.26  04/22/2014   INR 1.14 03/07/2015   HGBA1C 5.3 04/22/2014    Dg Chest 2 View  Result Date: 10/11/2016 CLINICAL DATA:  Cough times a few months.  Preop on 10/27/2016. EXAM: CHEST  2 VIEW COMPARISON:  03/22/2016 CXR, 03/05/2014 CT FINDINGS: Heart and mediastinal contours are stable with aortic atherosclerosis. Aorta is slightly uncoiled in appearance. There is chronic elevation of right hemidiaphragm with atelectasis and/or scarring at the right lung base. No overt pulmonary edema, effusion or pneumothorax. No pneumonic consolidations. Neural stimulator device projects up to the mid thorax. There is a chronic mild compression fracture of T8 and moderate-to-marked at T12. IMPRESSION: No active cardiopulmonary disease. Chronic mild T8 and moderate to marked compression deformities T12. Electronically Signed   By: Tollie Eth M.D.   On: 10/11/2016  14:08     Assessment & Plan:  Plan  I have discontinued Ms. Pesola meloxicam. I am also having her maintain her aspirin, Oxycodone HCl, morphine, furosemide, amLODipine, tiZANidine, amitriptyline, clonazePAM, amphetamine-dextroamphetamine, amphetamine-dextroamphetamine, amphetamine-dextroamphetamine, SUMAtriptan, and venlafaxine XR.  Meds ordered this encounter  Medications  . amphetamine-dextroamphetamine (ADDERALL XR) 30 MG 24 hr capsule    Sig: Take 1 capsule (30 mg total) by mouth every morning.    Dispense:  30 capsule    Refill:  0    Fill on or after Oct 2018  . amphetamine-dextroamphetamine (ADDERALL XR) 30 MG 24 hr capsule    Sig: Take 1 capsule (30 mg total) by mouth every morning.    Dispense:  30 capsule    Refill:  0    Fill on or after sept 2018  . amphetamine-dextroamphetamine (ADDERALL XR) 30 MG 24 hr capsule    Sig: Take 1 capsule (30 mg total) by mouth every morning.    Dispense:  30 capsule    Refill:  0  . SUMAtriptan (IMITREX) 50 MG tablet    Sig: Take 1 tablet (50 mg total) by mouth every 2 (two) hours as needed for  migraine. May repeat in 2 hours if headache persists or recurs.    Dispense:  10 tablet    Refill:  0  . venlafaxine XR (EFFEXOR-XR) 75 MG 24 hr capsule    Sig: Take 3 capsules (225 mg total) by mouth daily.    Dispense:  90 capsule    Refill:  0    Problem List Items Addressed This Visit      Unprioritized   ADD (attention deficit disorder) - Primary    Stable con't meds      Relevant Medications   amphetamine-dextroamphetamine (ADDERALL XR) 30 MG 24 hr capsule   amphetamine-dextroamphetamine (ADDERALL XR) 30 MG 24 hr capsule   amphetamine-dextroamphetamine (ADDERALL XR) 30 MG 24 hr capsule   Generalized anxiety disorder    Pt will con't meds for now -- she was interested in changing meds but has so much going on now she would like to wait She will think about psych referral       Relevant Medications   venlafaxine XR (EFFEXOR-XR) 75 MG 24 hr capsule   Nonintractable headache    Stable Con' t immitrex and elavil      Relevant Medications   SUMAtriptan (IMITREX) 50 MG tablet   venlafaxine XR (EFFEXOR-XR) 75 MG 24 hr capsule    Other Visit Diagnoses    Encounter for administration of vaccine       Relevant Orders   Flu vaccine HIGH DOSE PF (Fluzone High dose) (Completed)      Follow-up: Return in about 6 months (around 01/04/2018) for annual exam, fasting.  Donato Schultz, DO

## 2017-07-04 NOTE — Assessment & Plan Note (Signed)
Pt will con't meds for now -- she was interested in changing meds but has so much going on now she would like to wait She will think about psych referral

## 2017-07-04 NOTE — Assessment & Plan Note (Signed)
Stable con't meds 

## 2017-07-08 ENCOUNTER — Encounter (HOSPITAL_BASED_OUTPATIENT_CLINIC_OR_DEPARTMENT_OTHER): Payer: Self-pay | Admitting: *Deleted

## 2017-07-08 ENCOUNTER — Emergency Department (HOSPITAL_BASED_OUTPATIENT_CLINIC_OR_DEPARTMENT_OTHER)
Admission: EM | Admit: 2017-07-08 | Discharge: 2017-07-08 | Disposition: A | Discharge status: Home / Self Care | Payer: Medicare Other | Attending: Emergency Medicine | Admitting: Emergency Medicine

## 2017-07-08 DIAGNOSIS — N10 Acute tubulo-interstitial nephritis: Secondary | ICD-10-CM | POA: Diagnosis not present

## 2017-07-08 DIAGNOSIS — R109 Unspecified abdominal pain: Secondary | ICD-10-CM | POA: Diagnosis not present

## 2017-07-08 DIAGNOSIS — M549 Dorsalgia, unspecified: Secondary | ICD-10-CM | POA: Diagnosis present

## 2017-07-08 DIAGNOSIS — I10 Essential (primary) hypertension: Secondary | ICD-10-CM | POA: Diagnosis not present

## 2017-07-08 DIAGNOSIS — Z79899 Other long term (current) drug therapy: Secondary | ICD-10-CM | POA: Diagnosis not present

## 2017-07-08 LAB — CBC WITH DIFFERENTIAL/PLATELET
Basophils Absolute: 0 10*3/uL (ref 0.0–0.1)
Basophils Relative: 0 %
EOS PCT: 8 %
Eosinophils Absolute: 0.5 10*3/uL (ref 0.0–0.7)
HCT: 41.1 % (ref 36.0–46.0)
Hemoglobin: 13.5 g/dL (ref 12.0–15.0)
LYMPHS ABS: 1.9 10*3/uL (ref 0.7–4.0)
LYMPHS PCT: 33 %
MCH: 30.1 pg (ref 26.0–34.0)
MCHC: 32.8 g/dL (ref 30.0–36.0)
MCV: 91.5 fL (ref 78.0–100.0)
MONO ABS: 0.6 10*3/uL (ref 0.1–1.0)
Monocytes Relative: 10 %
Neutro Abs: 2.7 10*3/uL (ref 1.7–7.7)
Neutrophils Relative %: 49 %
PLATELETS: 201 10*3/uL (ref 150–400)
RBC: 4.49 MIL/uL (ref 3.87–5.11)
RDW: 12.9 % (ref 11.5–15.5)
WBC: 5.6 10*3/uL (ref 4.0–10.5)

## 2017-07-08 LAB — COMPREHENSIVE METABOLIC PANEL
ALK PHOS: 73 U/L (ref 38–126)
ALT: 9 U/L — AB (ref 14–54)
AST: 22 U/L (ref 15–41)
Albumin: 3.7 g/dL (ref 3.5–5.0)
Anion gap: 7 (ref 5–15)
BILIRUBIN TOTAL: 0.3 mg/dL (ref 0.3–1.2)
BUN: 11 mg/dL (ref 6–20)
CALCIUM: 8.7 mg/dL — AB (ref 8.9–10.3)
CHLORIDE: 103 mmol/L (ref 101–111)
CO2: 30 mmol/L (ref 22–32)
CREATININE: 0.93 mg/dL (ref 0.44–1.00)
GFR calc non Af Amer: >60 mL/min (ref >60)
Glucose, Bld: 84 mg/dL (ref 65–99)
Potassium: 3 mmol/L — ABNORMAL LOW (ref 3.5–5.1)
Sodium: 140 mmol/L (ref 135–145)
TOTAL PROTEIN: 7 g/dL (ref 6.5–8.1)

## 2017-07-08 LAB — URINALYSIS, MICROSCOPIC (REFLEX)

## 2017-07-08 LAB — URINALYSIS, ROUTINE W REFLEX MICROSCOPIC
BILIRUBIN URINE: NEGATIVE
GLUCOSE, UA: NEGATIVE mg/dL
Ketones, ur: NEGATIVE mg/dL
Nitrite: POSITIVE — AB
PH: 6 (ref 5.0–8.0)
Protein, ur: 30 mg/dL — AB
Specific Gravity, Urine: >1.03 — ABNORMAL HIGH (ref 1.005–1.030)

## 2017-07-08 LAB — LIPASE, BLOOD: Lipase: 22 U/L (ref 11–51)

## 2017-07-08 MED ORDER — CIPROFLOXACIN HCL 500 MG PO TABS: 500.0000 mg | ORAL_TABLET | Freq: Two times a day (BID) | ORAL | 0 refills | Status: DC

## 2017-07-08 MED ORDER — SODIUM CHLORIDE 0.9 % IV BOLUS (SEPSIS)
1000.0000 mL | Freq: Once | INTRAVENOUS | Status: AC
  Administered 2017-07-08: 1000 mL via INTRAVENOUS

## 2017-07-08 MED ORDER — DEXTROSE 5 % IV SOLN
1.0000 g | Freq: Once | INTRAVENOUS | Status: AC
  Administered 2017-07-08: 1 g via INTRAVENOUS
  Filled 2017-07-08: qty 10

## 2017-07-08 NOTE — ED Notes (Signed)
Pt sitting in waiting room bathroom, states she is attempting to provide a urine sample. Unable to void at this time.

## 2017-07-08 NOTE — ED Notes (Signed)
Nurse first-pt assisted from car to w/c to ED WR

## 2017-07-08 NOTE — ED Notes (Signed)
Pt unable to void 

## 2017-07-08 NOTE — ED Provider Notes (Signed)
[facility]   CSN: [id number] Arrival date & time: 07/08/17  1447     History   Chief Complaint Chief Complaint  Patient presents with  . Back Pain    HPI [name] is a 71 y.o. female.Chief complaint is left back, flank, and abdominal pain.  HPI: 71 year old female. Long history of chronic back pain. Takes MS Contin twice a day. Takes oxycodone 3 times a day when necessary. Follows with an orthopedic surgeon had previous back surgeries.  Recent study was within the last few months. Had CT myelogram showing left L5-S1 disc herniation. She does not have pain down her leg today. She does not have bowel or bladder symptoms. She states for about 1-2 weeks she's had pains in her left back along her lower rib. Reassuring anterior left mid abdomen. Not to her groin. History of frequency. Normal bowel habits.  Seen by the PA at her orthopedic surgeon's office today and referred here for "to see if it might be my kidney".  Past Medical History:  Diagnosis Date  . ADD (attention deficit disorder) 05/24/2012  . ANEMIA, MILD 06/26/2008   Qualifier: Diagnosis of  By: Janit Bern    . Anxiety   . Arthritis    "joints, back" (10/02/2013)  . Chronic back pain    "all over my back" (10/02/2013)  . Congestive heart disease (HCC)    "I have the beginning signs" (10/02/2013)  . Crushing injury of arm, right 02/06/1989's   "it was crushed; wore cast from fingers to top of my shoulder" (11/25/2  . Crushing injury of left wrist 05/11/1989's  . DIZZINESS 12/31/2009   Qualifier: Diagnosis of  By: Oswald Hillock    . Fibromyalgia   . GERD 12/03/2008   Qualifier: Diagnosis of  By: Freddy Jaksch    . HYPERLIPIDEMIA 05/15/2007   Qualifier: Diagnosis of  By: Janit Bern ; pt denies this hx on 10/02/2013  . Hypertension   . Idiopathic scoliosis 04/19/2013  . Migraines    "once/month" (10/02/2013)  . Osteoporosis, unspecified 04/19/2013  . Other vitamin B12  deficiency anemia 10/30/2009   Qualifier: Diagnosis of  By: Floydene Flock    . PALPITATIONS, RECURRENT 12/12/2009   Qualifier: Diagnosis of  By: Janit Bern    . Pica 12/28/2007   Qualifier: Diagnosis of  By: Janit Bern    . Pneumonia 10/02/2013   "had onsets of it before; not full blown til now" (10/02/2013)  . PSTPRC STATUS, BARIATRIC SURGERY 05/15/2007   Qualifier: Diagnosis of  By: Janit Bern    . Pulmonary nodules/lesions, multiple   . TREMOR, ESSENTIAL 12/12/2009   Qualifier: Diagnosis of  By: Janit Bern    . Unspecified Myalgia and Myositis 05/15/2007   Centricity Description: FIBROMYALGIA Qualifier: Diagnosis of  By: Janit Bern   Centricity Description: MYALGIA Qualifier: Diagnosis of  By: Janit Bern      Patient Active Problem List   Diagnosis Date Noted  . Generalized anxiety disorder 07/04/2017  . Nonintractable headache 07/04/2017  . Exertional dyspnea 08/05/2016  . Sepsis (HCC) 03/07/2015  . Hypokalemia 03/07/2015  . Acute encephalopathy 03/06/2015  . Fever 03/06/2015  . Chronic back pain 01/15/2015  . Obesity (BMI 30-39.9) 10/15/2013  . Pulmonary nodules 10/15/2013  . Community acquired pneumonia 10/02/2013  . CAP (community acquired pneumonia) 10/02/2013  . Essential hypertension 08/24/2013  . Idiopathic scoliosis 04/19/2013  . Back pain 04/19/2013  . Osteoporosis 04/19/2013  .  Edema 04/19/2013  . Mild diastolic dysfunction 01/01/2013  . Leg pain, bilateral 12/16/2012  . ADD (attention deficit disorder) 05/24/2012  . Involuntary movements 05/24/2012  . DIZZINESS 12/31/2009  . TREMOR, ESSENTIAL 12/12/2009  . PALPITATIONS, RECURRENT 12/12/2009  . OTHER VITAMIN B12 DEFICIENCY ANEMIA 10/30/2009  . FEMALE STRESS INCONTINENCE 10/28/2009  . MEMORY LOSS 10/28/2009  . Morbid obesity (HCC) 12/03/2008  . GERD 12/03/2008  . ANEMIA, MILD 06/26/2008  . DEPRESSION/ANXIETY 12/28/2007  . PICA 12/28/2007  . ACUTE BRONCHITIS 12/28/2007  .  FATIGUE 12/28/2007  . Hyperlipidemia 05/15/2007  . ARTIFICIAL MENOPAUSE 05/15/2007  . Myalgia and myositis, unspecified 05/15/2007  . HEADACHE 05/15/2007  . CHEST PAIN, ATYPICAL 05/15/2007  . SYMPTOM, PAIN, ABDOMINAL, EPIGASTRIC 05/15/2007  . REDUCTION MAMMOPLASTY, HX OF 05/15/2007  . PSTPRC STATUS, BARIATRIC SURGERY 05/15/2007    Past Surgical History:  Procedure Laterality Date  . ABDOMINAL HYSTERECTOMY  1980's  . APPENDECTOMY  1980's   "w/either my hysterectomy or myomectomy" (10/02/2013)  . BACK SURGERY    . CLOSED REDUCTION HAND FRACTURE Right 1990's   "it was crushed; wore cast from fingers to top of my shoulder" (10/02/2013)  . GASTRIC BYPASS  ~ 2003  . KNEE ARTHROSCOPY Right 2002  . MYOMECTOMY  1980's  . REDUCTION MAMMAPLASTY Bilateral 2001  . REFRACTIVE SURGERY Bilateral 2004  . SPINAL CORD STIMULATOR IMPLANT    . TONSILLECTOMY AND ADENOIDECTOMY  1955  . TUBAL LIGATION  1980's    OB History    No data available       Home Medications    Prior to Admission medications   Medication Sig Start Date End Date Taking? Authorizing Provider  amitriptyline (ELAVIL) 75 MG tablet Take 1 tablet (75 mg total) by mouth daily. 10/11/16   [name] R, DO  amLODipine (NORVASC) 5 MG tablet Take 1 tablet (5 mg total) by mouth daily. 08/05/16   Dyann Kief, PA-C  amphetamine-dextroamphetamine (ADDERALL XR) 30 MG 24 hr capsule Take 1 capsule (30 mg total) by mouth every morning. 07/04/17   Zola Button, Grayling Congress, DO  amphetamine-dextroamphetamine (ADDERALL XR) 30 MG 24 hr capsule Take 1 capsule (30 mg total) by mouth every morning. 07/04/17   Zola Button, Grayling Congress, DO  amphetamine-dextroamphetamine (ADDERALL XR) 30 MG 24 hr capsule Take 1 capsule (30 mg total) by mouth every morning. 07/04/17   Zola Button, Grayling Congress, DO  aspirin 81 MG tablet Take 81 mg by mouth daily.    [provider]  ciprofloxacin (CIPRO) 500 MG tablet Take 1 tablet (500 mg total) by mouth 2  (two) times daily. 07/08/17   Rolland Porter, MD  clonazePAM (KLONOPIN) 0.5 MG tablet TAKE ONE (1) TABLET BY MOUTH 3 TIMES DAILY AS NEEDED FOR ANXIETY 04/15/17   Zola Button, Grayling Congress, DO  furosemide (LASIX) 40 MG tablet Take 40 mg by mouth daily as needed for edema. 07/05/16   [provider]  morphine (MSIR) 15 MG tablet Take 15 mg by mouth every 12 (twelve) hours as needed for severe pain (for pain).    [provider]  Oxycodone HCl 20 MG TABS Take 1 tablet by mouth 4 (four) times daily as needed (pain).  03/06/15   [provider]  SUMAtriptan (IMITREX) 50 MG tablet Take 1 tablet (50 mg total) by mouth every 2 (two) hours as needed for migraine. May repeat in 2 hours if headache persists or recurs. 07/04/17   [name] R, DO  tiZANidine (ZANAFLEX) 4 MG  capsule Take 4 mg by mouth 3 (three) times daily as needed for muscle spasms.    [provider]  venlafaxine XR (EFFEXOR-XR) 75 MG 24 hr capsule Take 3 capsules (225 mg total) by mouth daily. 07/04/17   Donato SchultzLowne Chase, [name], DO    Family History Family History  Problem Relation Age of Onset  . Adopted: Yes  . COPD Mother   . Emphysema Mother   . Heart attack Mother   . Other Mother        tobacco abuse    Social History Social History  Substance Use Topics  . Smoking status: Never Smoker  . Smokeless tobacco: Never Used  . Alcohol use No     Allergies   Patient has no known allergies.   Review of Systems Review of Systems  Constitutional: Negative for appetite change, chills, diaphoresis, fatigue and fever.  HENT: Negative for mouth sores, sore throat and trouble swallowing.   Eyes: Negative for visual disturbance.  Respiratory: Negative for cough, chest tightness, shortness of breath and wheezing.   Cardiovascular: Negative for chest pain.  Gastrointestinal: Positive for abdominal pain. Negative for abdominal distention, diarrhea, nausea and vomiting.  Endocrine: Negative for  polydipsia, polyphagia and polyuria.  Genitourinary: Positive for flank pain. Negative for dysuria, frequency and hematuria.  Musculoskeletal: Positive for back pain. Negative for gait problem.  Skin: Negative for color change, pallor and rash.  Neurological: Negative for dizziness, syncope, light-headedness and headaches.  Hematological: Does not bruise/bleed easily.  Psychiatric/Behavioral: Negative for behavioral problems and confusion.     Physical Exam Updated Vital Signs BP 134/88 (BP Location: Left Arm)   Pulse 76   Temp 98.3 F (36.8 C)   Resp 18   Ht 5\' 5"  (1.651 m)   Wt 84.4 kg (186 lb)   SpO2 100%   BMI 30.95 kg/m   Physical Exam  Constitutional: She is oriented to person, place, and time. She appears well-developed and well-nourished. No distress.  HENT:  Head: Normocephalic.  Eyes: Pupils are equal, round, and reactive to light. Conjunctivae are normal. No scleral icterus.  Neck: Normal range of motion. Neck supple. No thyromegaly present.  Cardiovascular: Normal rate and regular rhythm.  Exam reveals no gallop and no friction rub.   No murmur heard. Pulmonary/Chest: Effort normal and breath sounds normal. No respiratory distress. She has no wheezes. She has no rales.  Abdominal: Soft. Bowel sounds are normal. She exhibits no distension. There is no tenderness. There is no rebound.  No rash. No vesicles. Soft abdomen. Tenderness to deep palpation. No guarding or rebound.  Musculoskeletal: Normal range of motion.  Neurological: She is alert and oriented to person, place, and time.  Skin: Skin is warm and dry. No rash noted.  Psychiatric: She has a normal mood and affect. Her behavior is normal.     ED Treatments / Results  Labs (all labs ordered are listed, but only abnormal results are displayed) Labs Reviewed  URINALYSIS, ROUTINE W REFLEX MICROSCOPIC - Abnormal; Notable for the following:       Result Value   APPearance CLOUDY (*)    Specific Gravity,  Urine >1.030 (*)    Hgb urine dipstick SMALL (*)    Protein, ur 30 (*)    Nitrite POSITIVE (*)    Leukocytes, UA SMALL (*)    All other components within normal limits  COMPREHENSIVE METABOLIC PANEL - Abnormal; Notable for the following:    Potassium 3.0 (*)    Calcium  8.7 (*)    ALT 9 (*)    All other components within normal limits  URINALYSIS, MICROSCOPIC (REFLEX) - Abnormal; Notable for the following:    Bacteria, UA MANY (*)    Squamous Epithelial / LPF 6-30 (*)    All other components within normal limits  URINE CULTURE  CBC WITH DIFFERENTIAL/PLATELET  LIPASE, BLOOD    EKG  EKG Interpretation None       Radiology No results found.  Procedures Procedures (including critical care time)  Medications Ordered in ED Medications  cefTRIAXone (ROCEPHIN) 1 g in dextrose 5 % 50 mL IVPB (not administered)  sodium chloride 0.9 % bolus 1,000 mL (1,000 mLs Intravenous New Bag/Given 07/08/17 1606)     Initial Impression / Assessment and Plan / ED Course  I have reviewed the triage vital signs and the nursing notes.  Pertinent labs & imaging results that were available during my care of the patient were reviewed by me and considered in my medical decision making (see chart for details).    No signs of zoster. I agree this does not seem related to an L5-S1 disc. She has normal strength sensation and reflexes are bilateral extremity's. Plan urine labs. May require some imaging. Differential diverticulitis, UTI, renal colic. We'll reassess after labs  Final Clinical Impressions(s) / ED Diagnoses   Final diagnoses:  Acute pyelonephritis   Urine is heme-negative. Appears to have infection. Culture pending. Given IV Rocephin. Home Cipro. Appropriate for discharge. Tolerating symptoms well.  New Prescriptions New Prescriptions   CIPROFLOXACIN (CIPRO) 500 MG TABLET    Take 1 tablet (500 mg total) by mouth 2 (two) times daily.     Rolland Porter, MD 07/08/17 [id number]

## 2017-07-08 NOTE — ED Triage Notes (Addendum)
Pt c/o back pain , sent here by Ortho today for eval ? UTi, pt falling asleep in tirage , husband answering questions, pt states her  Ms contin and percocet is not helping the pain

## 2017-07-08 NOTE — Discharge Instructions (Signed)
Continue your home pain medicine regimen. Cipro twice per day until completed. Follow-up with your primary care physician Tuesday if not improving.

## 2017-07-11 LAB — URINE CULTURE

## 2017-07-12 ENCOUNTER — Telehealth: Payer: Self-pay | Admitting: Emergency Medicine

## 2017-07-12 NOTE — Telephone Encounter (Signed)
Post ED Visit - Positive Culture Follow-up  Culture report reviewed by antimicrobial stewardship pharmacist:  []  Enzo BiNathan Batchelder, Pharm.D. []  Celedonio MiyamotoJeremy Frens, Pharm.D., BCPS AQ-ID []  Garvin FilaMike Maccia, Pharm.D., BCPS []  Georgina PillionElizabeth Martin, Pharm.D., BCPS []  PhoenixMinh Pham, [address] Rainbow BoulevardPharm.D., BCPS, AAHIVP []  Estella HuskMichelle Turner, Pharm.D., BCPS, AAHIVP []  Lysle Pearlachel Rumbarger, PharmD, BCPS []  Casilda Carlsaylor Stone, PharmD, BCPS [x]  Pollyann SamplesAndy Johnston, PharmD, BCPS  Positive urine culture Treated with ciprofloxacin, organism sensitive to the same and no further patient follow-up is required at this time.  Berle MullMiller, Rosevelt Luu 07/12/2017, 1:15 PM

## 2017-07-14 ENCOUNTER — Ambulatory Visit (INDEPENDENT_AMBULATORY_CARE_PROVIDER_SITE_OTHER): Payer: Medicare Other | Admitting: Family Medicine

## 2017-07-14 ENCOUNTER — Ambulatory Visit (HOSPITAL_BASED_OUTPATIENT_CLINIC_OR_DEPARTMENT_OTHER)
Admission: RE | Admit: 2017-07-14 | Discharge: 2017-07-14 | Disposition: A | Discharge status: Home / Self Care | Payer: Medicare Other | Source: Ambulatory Visit | Attending: Family Medicine | Admitting: Family Medicine

## 2017-07-14 ENCOUNTER — Encounter: Payer: Self-pay | Admitting: Family Medicine

## 2017-07-14 ENCOUNTER — Telehealth: Payer: Self-pay | Admitting: Family Medicine

## 2017-07-14 VITALS — BP 122/60 | HR 82 | Temp 98.5°F | Wt 186.0 lb

## 2017-07-14 DIAGNOSIS — R1032 Left lower quadrant pain: Secondary | ICD-10-CM | POA: Insufficient documentation

## 2017-07-14 NOTE — Telephone Encounter (Signed)
Pt started antibiotic at ED Friday night. Pt symptoms increased pain left side. Urinating twice daily. No relief. Pt given appt today with Wendling.

## 2017-07-14 NOTE — Patient Instructions (Signed)
Try MiraLAX 1-2 times daily over the next 3-4 days. If no improvement, try using an enema. Stay well hydrated and keep lots of fiber in your diet.

## 2017-07-14 NOTE — Progress Notes (Signed)
Pre visit review using our clinic review tool, if applicable. No additional management support is needed unless otherwise documented below in the visit note. 

## 2017-07-14 NOTE — Progress Notes (Signed)
Chief Complaint  Patient presents with  . Urinary Tract Infection    Molly HawthorneRita C Fischer is here for L sided abdominal pain.  Duration: 8 days Palliation: bending over Provocation: "everything makes it worse" She has been straining when defecating and feels pressure in area when she defecates. Associated symptoms: had a UTI, tx'd with Cipro, culture shows that it is sensitive to this Denies: fever, nausea and vomiting Treatment to date: Ibuprofen, home pain medicine.  ROS: Constitutional: No fevers GI: No N/V, no bleeding + pain  Past Medical History:  Diagnosis Date  . ADD (attention deficit disorder) 05/24/2012  . ANEMIA, MILD 06/26/2008   Qualifier: Diagnosis of  By: Janit BernLowne DO, Yvonne    . Anxiety   . Arthritis    "joints, back" (10/02/2013)  . Chronic back pain    "all over my back" (10/02/2013)  . Congestive heart disease (HCC)    "I have the beginning signs" (10/02/2013)  . Crushing injury of arm, right 02/06/1989's   "it was crushed; wore cast from fingers to top of my shoulder" (11/25/2  . Crushing injury of left wrist 05/11/1989's  . DIZZINESS 12/31/2009   Qualifier: Diagnosis of  By: Oswald HillockGraham-Oliver, Karen    . Fibromyalgia   . GERD 12/03/2008   Qualifier: Diagnosis of  By: Freddy JakschHooks, Phyllis    . HYPERLIPIDEMIA 05/15/2007   Qualifier: Diagnosis of  By: Janit BernLowne DO, Yvonne ; pt denies this hx on 10/02/2013  . Hypertension   . Idiopathic scoliosis 04/19/2013  . Migraines    "once/month" (10/02/2013)  . Osteoporosis, unspecified 04/19/2013  . Other vitamin B12 deficiency anemia 10/30/2009   Qualifier: Diagnosis of  By: Floydene FlockHoops, Regina    . PALPITATIONS, RECURRENT 12/12/2009   Qualifier: Diagnosis of  By: Janit BernLowne DO, Yvonne    . Pica 12/28/2007   Qualifier: Diagnosis of  By: Janit BernLowne DO, Yvonne    . Pneumonia 10/02/2013   "had onsets of it before; not full blown til now" (10/02/2013)  . PSTPRC STATUS, BARIATRIC SURGERY 05/15/2007   Qualifier: Diagnosis of  By: Janit BernLowne DO, Yvonne    . Pulmonary  nodules/lesions, multiple   . TREMOR, ESSENTIAL 12/12/2009   Qualifier: Diagnosis of  By: Janit BernLowne DO, Yvonne    . Unspecified Myalgia and Myositis 05/15/2007   Centricity Description: FIBROMYALGIA Qualifier: Diagnosis of  By: Janit BernLowne DO, Yvonne   Centricity Description: MYALGIA Qualifier: Diagnosis of  By: Janit BernLowne DO, Yvonne     Family History  Problem Relation Age of Onset  . Adopted: Yes  . COPD Mother   . Emphysema Mother   . Heart attack Mother   . Other Mother        tobacco abuse   Past Surgical History:  Procedure Laterality Date  . ABDOMINAL HYSTERECTOMY  1980's  . APPENDECTOMY  1980's   "w/either my hysterectomy or myomectomy" (10/02/2013)  . BACK SURGERY    . CLOSED REDUCTION HAND FRACTURE Right 1990's   "it was crushed; wore cast from fingers to top of my shoulder" (10/02/2013)  . GASTRIC BYPASS  ~ 2003  . KNEE ARTHROSCOPY Right 2002  . MYOMECTOMY  1980's  . REDUCTION MAMMAPLASTY Bilateral 2001  . REFRACTIVE SURGERY Bilateral 2004  . SPINAL CORD STIMULATOR IMPLANT    . TONSILLECTOMY AND ADENOIDECTOMY  1955  . TUBAL LIGATION  1980's    BP 122/60 (BP Location: Left Arm, Patient Position: Sitting, Cuff Size: Normal)   Pulse 82   Temp 98.5 F (36.9 C) (Oral)   Wt 186 lb (  84.4 kg)   SpO2 96%   BMI 30.95 kg/m  Gen.: Awake, alert, appears stated age HEENT: Mucous membranes moist without mucosal lesions Heart: Regular rate and rhythm without murmurs Lungs: Clear auscultation bilaterally, no rales or wheezing, normal effort without accessory muscle use. Abdomen: Bowel sounds are present. Abdomen is soft, TTP diffusely- worse in the LLQ, nondistended, no masses or organomegaly. Negative Murphy's, Rovsing's, McBurney's, and Carnett's sign. Psych: Age appropriate judgment and insight. Normal mood and affect.  Left lower quadrant abdominal pain of unknown etiology - Plan: DG Abd 2 Views  Orders as above. I thought she needed a BM, but pt didn't believe me. She agreed to a AXR. I  showed her the stool and gas. She agreed to tx her constipation. Miralax for 3 days then enema/suppository. OK to use sooner if she would like. Stay well hydrated. F/u prn. Pt voiced understanding and agreement to the plan.  [name], DO 07/14/17 11:58 AM

## 2017-07-17 ENCOUNTER — Emergency Department (HOSPITAL_BASED_OUTPATIENT_CLINIC_OR_DEPARTMENT_OTHER)
Admission: EM | Admit: 2017-07-17 | Discharge: 2017-07-17 | Disposition: A | Discharge status: Home / Self Care | Payer: Medicare Other | Attending: Emergency Medicine | Admitting: Emergency Medicine

## 2017-07-17 ENCOUNTER — Encounter (HOSPITAL_BASED_OUTPATIENT_CLINIC_OR_DEPARTMENT_OTHER): Payer: Self-pay | Admitting: Emergency Medicine

## 2017-07-17 ENCOUNTER — Emergency Department (HOSPITAL_BASED_OUTPATIENT_CLINIC_OR_DEPARTMENT_OTHER): Payer: Medicare Other

## 2017-07-17 DIAGNOSIS — Z79899 Other long term (current) drug therapy: Secondary | ICD-10-CM | POA: Diagnosis not present

## 2017-07-17 DIAGNOSIS — G8929 Other chronic pain: Secondary | ICD-10-CM | POA: Insufficient documentation

## 2017-07-17 DIAGNOSIS — I119 Hypertensive heart disease without heart failure: Secondary | ICD-10-CM | POA: Diagnosis not present

## 2017-07-17 DIAGNOSIS — R109 Unspecified abdominal pain: Secondary | ICD-10-CM

## 2017-07-17 DIAGNOSIS — R1032 Left lower quadrant pain: Secondary | ICD-10-CM | POA: Diagnosis not present

## 2017-07-17 DIAGNOSIS — Z7982 Long term (current) use of aspirin: Secondary | ICD-10-CM | POA: Diagnosis not present

## 2017-07-17 LAB — CBC WITH DIFFERENTIAL/PLATELET
BASOS PCT: 0 %
Basophils Absolute: 0 10*3/uL (ref 0.0–0.1)
EOS ABS: 0.5 10*3/uL (ref 0.0–0.7)
EOS PCT: 8 %
HCT: 41.8 % (ref 36.0–46.0)
HEMOGLOBIN: 13.9 g/dL (ref 12.0–15.0)
Lymphocytes Relative: 35 %
Lymphs Abs: 2.1 10*3/uL (ref 0.7–4.0)
MCH: 30.6 pg (ref 26.0–34.0)
MCHC: 33.3 g/dL (ref 30.0–36.0)
MCV: 92.1 fL (ref 78.0–100.0)
MONOS PCT: 12 %
Monocytes Absolute: 0.7 10*3/uL (ref 0.1–1.0)
NEUTROS PCT: 45 %
Neutro Abs: 2.7 10*3/uL (ref 1.7–7.7)
Platelets: 234 10*3/uL (ref 150–400)
RBC: 4.54 MIL/uL (ref 3.87–5.11)
RDW: 12.8 % (ref 11.5–15.5)
WBC: 5.9 10*3/uL (ref 4.0–10.5)

## 2017-07-17 LAB — URINALYSIS, ROUTINE W REFLEX MICROSCOPIC
BILIRUBIN URINE: NEGATIVE
Glucose, UA: NEGATIVE mg/dL
HGB URINE DIPSTICK: NEGATIVE
KETONES UR: NEGATIVE mg/dL
Leukocytes, UA: NEGATIVE
Nitrite: NEGATIVE
Protein, ur: NEGATIVE mg/dL
pH: 6 (ref 5.0–8.0)

## 2017-07-17 LAB — COMPREHENSIVE METABOLIC PANEL
ALK PHOS: 102 U/L (ref 38–126)
ALT: 10 U/L — AB (ref 14–54)
ANION GAP: 7 (ref 5–15)
AST: 21 U/L (ref 15–41)
Albumin: 3.7 g/dL (ref 3.5–5.0)
BILIRUBIN TOTAL: 0.6 mg/dL (ref 0.3–1.2)
BUN: 18 mg/dL (ref 6–20)
CALCIUM: 9.2 mg/dL (ref 8.9–10.3)
CO2: 30 mmol/L (ref 22–32)
Chloride: 102 mmol/L (ref 101–111)
Creatinine, Ser: 0.94 mg/dL (ref 0.44–1.00)
Glucose, Bld: 89 mg/dL (ref 65–99)
Potassium: 3.5 mmol/L (ref 3.5–5.1)
Sodium: 139 mmol/L (ref 135–145)
TOTAL PROTEIN: 7.5 g/dL (ref 6.5–8.1)

## 2017-07-17 LAB — LIPASE, BLOOD: LIPASE: 24 U/L (ref 11–51)

## 2017-07-17 MED ORDER — IOPAMIDOL (ISOVUE-300) INJECTION 61%
100.0000 mL | Freq: Once | INTRAVENOUS | Status: AC | PRN
  Administered 2017-07-17: 100 mL via INTRAVENOUS

## 2017-07-17 MED ORDER — HYDROMORPHONE HCL 1 MG/ML IJ SOLN
1.0000 mg | Freq: Once | INTRAMUSCULAR | Status: AC
  Administered 2017-07-17: 1 mg via INTRAVENOUS
  Filled 2017-07-17: qty 1

## 2017-07-17 NOTE — ED Provider Notes (Signed)
[facility]   CSN: [id number] Arrival date & time: 07/17/17  1148     History   Chief Complaint Chief Complaint  Patient presents with  . Abdominal Pain    HPI [name] is a 71 y.o. female.  HPI 71 year old female who presents with left flank pain and left sided abdominal pain, ongoing for 2 weeks. She has a history of chronic low back pain, L5-S1 disc herniation, appendectomy, gastric bypass, and abdominal hysterectomy. Seen in the ED at the end of August for similar symptoms, diagnosed with kidney infection. She has finished her course of ciprofloxacin and has not had improvement. Was seen by the primary care doctor a few days ago, with a reassuring abdominal x-ray. She was diagnosed with fecal impaction and constipation. Was started on constipation agents, and reports having multiple bowel movements but no improvement in her pain. Pain worse with movement, better when she is lying flat or sitting upright at a 90 angle. Has been taking home oxycodone, morphine, and Aleve without improvement. Denies any fever, nausea or vomiting, diarrhea, melena or hematochezia, dysuria or urinary frequency, hematuria, chest pain or difficulty breathing. No focal numbness or weakness, bowel or urinary incontinence, or urinary retention. This is not consistent with history of chronic back pain.   Past Medical History:  Diagnosis Date  . ADD (attention deficit disorder) 05/24/2012  . ANEMIA, MILD 06/26/2008   Qualifier: Diagnosis of  By: Janit BernLowne DO, Yvonne    . Anxiety   . Arthritis    "joints, back" (10/02/2013)  . Chronic back pain    "all over my back" (10/02/2013)  . Congestive heart disease (HCC)    "I have the beginning signs" (10/02/2013)  . Crushing injury of arm, right 02/06/1989's   "it was crushed; wore cast from fingers to top of my shoulder" (11/25/2  . Crushing injury of left wrist 05/11/1989's  . DIZZINESS 12/31/2009   Qualifier: Diagnosis of  By:  Oswald HillockGraham-Oliver, Karen    . Fibromyalgia   . GERD 12/03/2008   Qualifier: Diagnosis of  By: Freddy JakschHooks, Phyllis    . HYPERLIPIDEMIA 05/15/2007   Qualifier: Diagnosis of  By: Janit BernLowne DO, Yvonne ; pt denies this hx on 10/02/2013  . Hypertension   . Idiopathic scoliosis 04/19/2013  . Migraines    "once/month" (10/02/2013)  . Osteoporosis, unspecified 04/19/2013  . Other vitamin B12 deficiency anemia 10/30/2009   Qualifier: Diagnosis of  By: Floydene FlockHoops, Regina    . PALPITATIONS, RECURRENT 12/12/2009   Qualifier: Diagnosis of  By: Janit BernLowne DO, Yvonne    . Pica 12/28/2007   Qualifier: Diagnosis of  By: Janit BernLowne DO, Yvonne    . Pneumonia 10/02/2013   "had onsets of it before; not full blown til now" (10/02/2013)  . PSTPRC STATUS, BARIATRIC SURGERY 05/15/2007   Qualifier: Diagnosis of  By: Janit BernLowne DO, Yvonne    . Pulmonary nodules/lesions, multiple   . TREMOR, ESSENTIAL 12/12/2009   Qualifier: Diagnosis of  By: Janit BernLowne DO, Yvonne    . Unspecified Myalgia and Myositis 05/15/2007   Centricity Description: FIBROMYALGIA Qualifier: Diagnosis of  By: Janit BernLowne DO, Yvonne   Centricity Description: MYALGIA Qualifier: Diagnosis of  By: Janit BernLowne DO, Yvonne      Patient Active Problem List   Diagnosis Date Noted  . Generalized anxiety disorder 07/04/2017  . Nonintractable headache 07/04/2017  . Exertional dyspnea 08/05/2016  . Sepsis (HCC) 03/07/2015  . Hypokalemia 03/07/2015  . Acute encephalopathy 03/06/2015  . Fever 03/06/2015  . Chronic back  pain 01/15/2015  . Obesity (BMI 30-39.9) 10/15/2013  . Pulmonary nodules 10/15/2013  . Community acquired pneumonia 10/02/2013  . CAP (community acquired pneumonia) 10/02/2013  . Essential hypertension 08/24/2013  . Idiopathic scoliosis 04/19/2013  . Back pain 04/19/2013  . Osteoporosis 04/19/2013  . Edema 04/19/2013  . Mild diastolic dysfunction 01/01/2013  . Leg pain, bilateral 12/16/2012  . ADD (attention deficit disorder) 05/24/2012  . Involuntary movements 05/24/2012  . DIZZINESS  12/31/2009  . TREMOR, ESSENTIAL 12/12/2009  . PALPITATIONS, RECURRENT 12/12/2009  . OTHER VITAMIN B12 DEFICIENCY ANEMIA 10/30/2009  . FEMALE STRESS INCONTINENCE 10/28/2009  . MEMORY LOSS 10/28/2009  . Morbid obesity (HCC) 12/03/2008  . GERD 12/03/2008  . ANEMIA, MILD 06/26/2008  . DEPRESSION/ANXIETY 12/28/2007  . PICA 12/28/2007  . ACUTE BRONCHITIS 12/28/2007  . FATIGUE 12/28/2007  . Hyperlipidemia 05/15/2007  . ARTIFICIAL MENOPAUSE 05/15/2007  . Myalgia and myositis, unspecified 05/15/2007  . HEADACHE 05/15/2007  . CHEST PAIN, ATYPICAL 05/15/2007  . SYMPTOM, PAIN, ABDOMINAL, EPIGASTRIC 05/15/2007  . REDUCTION MAMMOPLASTY, HX OF 05/15/2007  . PSTPRC STATUS, BARIATRIC SURGERY 05/15/2007    Past Surgical History:  Procedure Laterality Date  . ABDOMINAL HYSTERECTOMY  1980's  . APPENDECTOMY  1980's   "w/either my hysterectomy or myomectomy" (10/02/2013)  . BACK SURGERY    . CLOSED REDUCTION HAND FRACTURE Right 1990's   "it was crushed; wore cast from fingers to top of my shoulder" (10/02/2013)  . GASTRIC BYPASS  ~ 2003  . KNEE ARTHROSCOPY Right 2002  . MYOMECTOMY  1980's  . REDUCTION MAMMAPLASTY Bilateral 2001  . REFRACTIVE SURGERY Bilateral 2004  . SPINAL CORD STIMULATOR IMPLANT    . TONSILLECTOMY AND ADENOIDECTOMY  1955  . TUBAL LIGATION  1980's    OB History    No data available       Home Medications    Prior to Admission medications   Medication Sig Start Date End Date Taking? Authorizing Provider  amitriptyline (ELAVIL) 75 MG tablet Take 1 tablet (75 mg total) by mouth daily. 10/11/16   [name] R, DO  amLODipine (NORVASC) 5 MG tablet Take 1 tablet (5 mg total) by mouth daily. 08/05/16   Dyann Kief, PA-C  amphetamine-dextroamphetamine (ADDERALL XR) 30 MG 24 hr capsule Take 1 capsule (30 mg total) by mouth every morning. 07/04/17   Zola Button, Grayling Congress, DO  amphetamine-dextroamphetamine (ADDERALL XR) 30 MG 24 hr capsule Take 1 capsule (30 mg  total) by mouth every morning. 07/04/17   Zola Button, Grayling Congress, DO  amphetamine-dextroamphetamine (ADDERALL XR) 30 MG 24 hr capsule Take 1 capsule (30 mg total) by mouth every morning. 07/04/17   Zola Button, Grayling Congress, DO  aspirin 81 MG tablet Take 81 mg by mouth daily.    [provider]  ciprofloxacin (CIPRO) 500 MG tablet Take 1 tablet (500 mg total) by mouth 2 (two) times daily. 07/08/17   Rolland Porter, MD  clonazePAM (KLONOPIN) 0.5 MG tablet TAKE ONE (1) TABLET BY MOUTH 3 TIMES DAILY AS NEEDED FOR ANXIETY 04/15/17   Zola Button, Grayling Congress, DO  furosemide (LASIX) 40 MG tablet Take 40 mg by mouth daily as needed for edema. 07/05/16   [provider]  morphine (MSIR) 15 MG tablet Take 15 mg by mouth every 12 (twelve) hours as needed for severe pain (for pain).    [provider]  Oxycodone HCl 20 MG TABS Take 1 tablet by mouth 4 (four) times daily as needed (pain).  03/06/15   [provider]  SUMAtriptan (IMITREX) 50 MG tablet Take 1 tablet (50 mg total) by mouth every 2 (two) hours as needed for migraine. May repeat in 2 hours if headache persists or recurs. 07/04/17   Donato Schultz, DO  tiZANidine (ZANAFLEX) 4 MG capsule Take 4 mg by mouth 3 (three) times daily as needed for muscle spasms.    [provider]  venlafaxine XR (EFFEXOR-XR) 75 MG 24 hr capsule Take 3 capsules (225 mg total) by mouth daily. 07/04/17   Donato Schultz, DO    Family History Family History  Problem Relation Age of Onset  . Adopted: Yes  . COPD Mother   . Emphysema Mother   . Heart attack Mother   . Other Mother        tobacco abuse    Social History Social History  Substance Use Topics  . Smoking status: Never Smoker  . Smokeless tobacco: Never Used  . Alcohol use No     Allergies   Patient has no known allergies.   Review of Systems Review of Systems  Constitutional: Negative for fever.  Respiratory: Negative for shortness of breath.     Cardiovascular: Negative for chest pain.  Gastrointestinal: Positive for abdominal pain. Negative for diarrhea, nausea and vomiting.  Genitourinary: Positive for flank pain. Negative for dysuria, frequency and hematuria.  All other systems reviewed and are negative.    Physical Exam Updated Vital Signs BP (!) 130/91 (BP Location: Right Arm)   Pulse 92   Temp 98.5 F (36.9 C) (Oral)   Resp 18   Wt 84.4 kg (186 lb)   SpO2 95%   BMI 30.95 kg/m   Physical Exam  Physical Exam  Nursing note and vitals reviewed. Constitutional: Well developed, well nourished, non-toxic, and in no acute distress Head: Normocephalic and atraumatic.  Mouth/Throat: Oropharynx is clear and moist.  Neck: Normal range of motion. Neck supple.  Cardiovascular: Normal rate and regular rhythm.   Pulmonary/Chest: Effort normal and breath sounds normal.  Abdominal: Soft. There is mild left sided abdominal tenderness. There is no rebound and no guarding.  Musculoskeletal: No TLS spine tenderness. Spinal stimulator in the right low back. No overlying skin changes. Tender to palpation over left flank. Neurological: Alert, no facial droop, fluent speech, moves all extremities symmetrically, full strength bilateral upper and lower extremities Skin: Skin is warm and dry.  Psychiatric: Cooperative  ED Treatments / Results  Labs (all labs ordered are listed, but only abnormal results are displayed) Labs Reviewed  COMPREHENSIVE METABOLIC PANEL - Abnormal; Notable for the following:       Result Value   ALT 10 (*)    All other components within normal limits  URINALYSIS, ROUTINE W REFLEX MICROSCOPIC - Abnormal; Notable for the following:    APPearance CLOUDY (*)    Specific Gravity, Urine >1.030 (*)    All other components within normal limits  CBC WITH DIFFERENTIAL/PLATELET  LIPASE, BLOOD    EKG  EKG Interpretation None       Radiology Ct Abdomen Pelvis W Contrast  Result Date: 07/17/2017 CLINICAL  DATA:  Left flank pain for 2 weeks. EXAM: CT ABDOMEN AND PELVIS WITH CONTRAST TECHNIQUE: Multidetector CT imaging of the abdomen and pelvis was performed using the standard protocol following bolus administration of intravenous contrast. CONTRAST:  ISOVUE-300 IOPAMIDOL (ISOVUE-300) INJECTION 61% COMPARISON:  CT scan of July 31, 2004. FINDINGS: Lower chest: No acute abnormality. Hepatobiliary: No focal liver abnormality is seen. No  gallstones, gallbladder wall thickening, or biliary dilatation. Pancreas: Unremarkable. No pancreatic ductal dilatation or surrounding inflammatory changes. Spleen: Normal in size without focal abnormality. Adrenals/Urinary Tract: Adrenal glands are unremarkable. Kidneys are normal, without renal calculi, focal lesion, or hydronephrosis. Bladder is unremarkable. Stomach/Bowel: Status post gastric surgery and appendectomy. There is no evidence of bowel dilatation or obstruction. Vascular/Lymphatic: Aortic atherosclerosis. No enlarged abdominal or pelvic lymph nodes. Reproductive: Status post hysterectomy. No adnexal masses. Other: No abdominal wall hernia or abnormality. No abdominopelvic ascites. Musculoskeletal: No acute or significant osseous findings. IMPRESSION: Aortic atherosclerosis. No acute abnormality seen in the abdomen or pelvis. Electronically Signed   By: Lupita Raider, M.D.   On: 07/17/2017 13:14    Procedures Procedures (including critical care time)  Medications Ordered in ED Medications  HYDROmorphone (DILAUDID) injection 1 mg (1 mg Intravenous Given 07/17/17 1220)  iopamidol (ISOVUE-300) 61 % injection 100 mL (100 mLs Intravenous Contrast Given 07/17/17 1238)     Initial Impression / Assessment and Plan / ED Course  I have reviewed the triage vital signs and the nursing notes.  Pertinent labs & imaging results that were available during my care of the patient were reviewed by me and considered in my medical decision making (see chart for  details).     71 year old female who presents with persistent left-sided flank and abdominal pain for 2 weeks. She is nontoxic in no acute distress. Afebrile with normal vitals. Her abdomen is overall soft and benign with some left flank and left lower quadrant tenderness to palpation. She has a normal neurological exam, no midline tenderness, and a do not feel presentation is consistent with acute spinal cord or spinal process. Her UA is normal, and she appears to have cleared her kidney infection which she has recently been taking antibiotics for. CT abdomen and pelvis was performed to rule out kidney stone versus diverticulitis versus other acute pathology. This is visualized and shows no acute intra-abdominal or intrapelvic processes. Patient is reassured. This could just also be musculoskeletal pain. Discussed follow-up with primary care doctor regarding pain management. Strict return and follow-up instructions reviewed. She expressed understanding of all discharge instructions and felt comfortable with the plan of care.   Final Clinical Impressions(s) / ED Diagnoses   Final diagnoses:  Left flank pain    New Prescriptions New Prescriptions   No medications on file     Lavera Guise, MD 07/17/17 1404

## 2017-07-17 NOTE — Discharge Instructions (Signed)
Your CT does not show any serious process the back, abdomen or pelvis.  Please follow-up with her primary care doctor regarding further pain control.  Return without fail for worsening symptoms, including numbness or weakness in the legs, fevers, or any other symptoms concerning to you.

## 2017-07-17 NOTE — ED Triage Notes (Signed)
Patient states she was here 2 weeks ago with the same pain, the patient reports that she has been seen and treated here in this ED and at her PMD - patient was told that she was constipated and possible impaction. She has had large amounts of BM's since this and the pain is not any better

## 2017-07-22 ENCOUNTER — Other Ambulatory Visit: Payer: Self-pay | Admitting: Orthopaedic Surgery

## 2017-07-22 DIAGNOSIS — M546 Pain in thoracic spine: Secondary | ICD-10-CM

## 2017-07-22 DIAGNOSIS — M545 Low back pain, unspecified: Secondary | ICD-10-CM

## 2017-08-02 ENCOUNTER — Other Ambulatory Visit: Payer: Medicare Other

## 2017-08-04 ENCOUNTER — Other Ambulatory Visit: Payer: Self-pay | Admitting: *Deleted

## 2017-08-04 ENCOUNTER — Other Ambulatory Visit: Payer: Self-pay | Admitting: Family Medicine

## 2017-08-04 DIAGNOSIS — F411 Generalized anxiety disorder: Secondary | ICD-10-CM

## 2017-08-04 MED ORDER — AMLODIPINE BESYLATE 5 MG PO TABS: 5.0000 mg | ORAL_TABLET | Freq: Every day | ORAL | 0 refills | Status: DC

## 2017-08-04 NOTE — Telephone Encounter (Signed)
Self.   Refill for venlafaxine - pt would like refills if possible.    Pharmacy: [facility] - [place], [place] - [address]

## 2017-08-05 MED ORDER — VENLAFAXINE HCL ER 75 MG PO CP24: 225.0000 mg | ORAL_CAPSULE | Freq: Every day | ORAL | 0 refills | Status: DC

## 2017-08-05 NOTE — Telephone Encounter (Signed)
Rx sent 

## 2017-08-05 NOTE — Telephone Encounter (Signed)
°  Relation to ZO:XWRU Call back number:[phone] Pharmacy: [facility] - [place], [place] - [address] [phone] (Phone) [phone] (Fax)    D.O.D Dr. Drue Novel   Reason for call:  Patient requesting 3 month supply venlafaxine XR (EFFEXOR-XR) 75 MG 24 hr capsule, patient states not suppose to stop medication and this will be her 3rd day without, please advise

## 2017-08-08 ENCOUNTER — Ambulatory Visit
Admission: RE | Admit: 2017-08-08 | Discharge: 2017-08-08 | Disposition: A | Discharge status: Home / Self Care | Payer: Medicare Other | Source: Ambulatory Visit | Attending: Orthopaedic Surgery | Admitting: Orthopaedic Surgery

## 2017-08-08 DIAGNOSIS — M546 Pain in thoracic spine: Secondary | ICD-10-CM

## 2017-08-08 DIAGNOSIS — M545 Low back pain, unspecified: Secondary | ICD-10-CM

## 2017-08-08 MED ORDER — HYDROMORPHONE HCL 1 MG/ML IJ SOLN
1.0000 mg | Freq: Once | INTRAMUSCULAR | Status: AC
  Administered 2017-08-08: 1 mg via INTRAMUSCULAR

## 2017-08-08 MED ORDER — ONDANSETRON 8 MG PO TBDP
8.0000 mg | ORAL_TABLET | Freq: Once | ORAL | Status: AC
  Administered 2017-08-08: 8 mg via ORAL

## 2017-08-08 MED ORDER — HYDROXYZINE HCL 50 MG/ML IM SOLN
25.0000 mg | Freq: Once | INTRAMUSCULAR | Status: AC
  Administered 2017-08-08: 25 mg via INTRAMUSCULAR

## 2017-08-08 MED ORDER — DIAZEPAM 5 MG PO TABS
10.0000 mg | ORAL_TABLET | Freq: Once | ORAL | Status: AC
  Administered 2017-08-08: 10 mg via ORAL

## 2017-08-08 MED ORDER — IOPAMIDOL (ISOVUE-M 300) INJECTION 61%
10.0000 mL | Freq: Once | INTRAMUSCULAR | Status: AC | PRN
  Administered 2017-08-08: 10 mL via INTRATHECAL

## 2017-08-08 NOTE — Discharge Instructions (Signed)
Myelogram Discharge Instructions  1. Go home and rest quietly for the next 24 hours.  It is important to lie flat for the next 24 hours.  Get up only to go to the restroom.  You may lie in the bed or on a couch on your back, your stomach, your left side or your right side.  You may have one pillow under your head.  You may have pillows between your knees while you are on your side or under your knees while you are on your back.  2. DO NOT drive today.  Recline the seat as far back as it will go, while still wearing your seat belt, on the way home.  3. You may get up to go to the bathroom as needed.  You may sit up for 10 minutes to eat.  You may resume your normal diet and medications unless otherwise indicated.  Drink plenty of extra fluids today and tomorrow.  4. The incidence of a spinal headache with nausea and/or vomiting is about 5% (one in 20 patients).  If you develop a headache, lie flat and drink plenty of fluids until the headache goes away.  Caffeinated beverages may be helpful.  If you develop severe nausea and vomiting or a headache that does not go away with flat bed rest, call [phone].  5. You may resume normal activities after your 24 hours of bed rest is over; however, do not exert yourself strongly or do any heavy lifting tomorrow.  6. Call your physician for a follow-up appointment.   You may resume Adderall, Effexor, Elavil and Imitrex on Tuesday, August 09, 2017 after 9:30a.m.

## 2017-08-12 ENCOUNTER — Other Ambulatory Visit: Payer: Self-pay | Admitting: Emergency Medicine

## 2017-08-12 ENCOUNTER — Telehealth: Payer: Self-pay | Admitting: Family Medicine

## 2017-08-12 DIAGNOSIS — F988 Other specified behavioral and emotional disorders with onset usually occurring in childhood and adolescence: Secondary | ICD-10-CM

## 2017-08-12 MED ORDER — AMPHETAMINE-DEXTROAMPHET ER 30 MG PO CP24: 30.0000 mg | ORAL_CAPSULE | ORAL | 0 refills | Status: DC

## 2017-08-12 NOTE — Telephone Encounter (Signed)
Requesting: amphetamine-dextroamphetamine (ADDERALL XR) 30 MG   01/12/17 controlled substance contract signed  uds sample given Last OV: 07/04/17 Last Refill: 07/04/17  Please Advise

## 2017-08-12 NOTE — Telephone Encounter (Signed)
Pt called request refill ADDERRAL. Call pt when ready for pick up.

## 2017-08-12 NOTE — Telephone Encounter (Signed)
Refill done per chart.

## 2017-08-15 ENCOUNTER — Other Ambulatory Visit: Payer: Self-pay | Admitting: Family Medicine

## 2017-08-15 DIAGNOSIS — F988 Other specified behavioral and emotional disorders with onset usually occurring in childhood and adolescence: Secondary | ICD-10-CM

## 2017-08-19 NOTE — Progress Notes (Signed)
Cardiology Office Note    Date:  08/23/2017   ID:  [name], DOB 12-Nov-1945, MRN [id number]  PCP:  Zola Button, Grayling Congress, DO  Cardiologist:  Dr. Delton See  Chief Complaint: 2 weeks follow up for dyspnea  History of Present Illness:   [name] is a 71 y.o. female hypertension, chronic diastolic CHF, and obesity and HLD presents for follow up.   She last saw Dr. Delton See 01/2015 for evaluation of lower extremity edema. She was initially given furosemide with significant improvement. LE edema resolved with discontinuation of Adderal.Lower extremity edema return in 01/2015 and she was started back on Lasix. She was also complaining of shortness of breath at rest and on exertion which resolved and she had normal PFTs.  2-D echo 03/07/15 normal systolic function LVEF 55-60% grade 1 DD.  Last seen by Robbie Lis 08/05/16 for follow up. BP was stable however she continued to have worsening dyspnea on exertion. Order echo and stress test for further evaluation. She also had back pain that shooting down to left lower extremity--> f/u with back specialist. Follow up echo showed normal LVEF with grade 1 DD. Low risk stress test.   Here today for follow up. Patient's dyspnea has been improving as she is recovering from a URI for the past 4 weeks. No chest pain, orthopnea, PND, syncope, palpitation, or melena. She takes Lasix when necessary for lower extremity edema. She requires once every 2 weeks.   Past Medical History:  Diagnosis Date  . ADD (attention deficit disorder) 05/24/2012  . ANEMIA, MILD 06/26/2008   Qualifier: Diagnosis of  By: Janit Bern    . Anxiety   . Arthritis    "joints, back" (10/02/2013)  . Chronic back pain    "all over my back" (10/02/2013)  . Congestive heart disease (HCC)    "I have the beginning signs" (10/02/2013)  . Crushing injury of arm, right 02/06/1989's   "it was crushed; wore cast from fingers to top of my shoulder" (11/25/2  . Crushing injury  of left wrist 05/11/1989's  . DIZZINESS 12/31/2009   Qualifier: Diagnosis of  By: Oswald Hillock    . Fibromyalgia   . GERD 12/03/2008   Qualifier: Diagnosis of  By: Freddy Jaksch    . HYPERLIPIDEMIA 05/15/2007   Qualifier: Diagnosis of  By: Janit Bern ; pt denies this hx on 10/02/2013  . Hypertension   . Idiopathic scoliosis 04/19/2013  . Migraines    "once/month" (10/02/2013)  . Osteoporosis, unspecified 04/19/2013  . Other vitamin B12 deficiency anemia 10/30/2009   Qualifier: Diagnosis of  By: Floydene Flock    . PALPITATIONS, RECURRENT 12/12/2009   Qualifier: Diagnosis of  By: Janit Bern    . Pica 12/28/2007   Qualifier: Diagnosis of  By: Janit Bern    . Pneumonia 10/02/2013   "had onsets of it before; not full blown til now" (10/02/2013)  . PSTPRC STATUS, BARIATRIC SURGERY 05/15/2007   Qualifier: Diagnosis of  By: Janit Bern    . Pulmonary nodules/lesions, multiple   . TREMOR, ESSENTIAL 12/12/2009   Qualifier: Diagnosis of  By: Janit Bern    . Unspecified Myalgia and Myositis 05/15/2007   Centricity Description: FIBROMYALGIA Qualifier: Diagnosis of  By: Janit Bern   Centricity Description: MYALGIA Qualifier: Diagnosis of  By: Janit Bern      Past Surgical History:  Procedure Laterality Date  . ABDOMINAL HYSTERECTOMY  1980's  . APPENDECTOMY  1980's   "  w/either my hysterectomy or myomectomy" (10/02/2013)  . BACK SURGERY    . CLOSED REDUCTION HAND FRACTURE Right 1990's   "it was crushed; wore cast from fingers to top of my shoulder" (10/02/2013)  . GASTRIC BYPASS  ~ 2003  . KNEE ARTHROSCOPY Right 2002  . MYOMECTOMY  1980's  . REDUCTION MAMMAPLASTY Bilateral 2001  . REFRACTIVE SURGERY Bilateral 2004  . SPINAL CORD STIMULATOR IMPLANT    . TONSILLECTOMY AND ADENOIDECTOMY  1955  . TUBAL LIGATION  1980's    Current Medications: Prior to Admission medications   Medication Sig Start Date End Date Taking? Authorizing Provider  amitriptyline  (ELAVIL) 75 MG tablet Take 1 tablet (75 mg total) by mouth daily. 10/11/16   [name] R, DO  amLODipine (NORVASC) 5 MG tablet Take 1 tablet (5 mg total) by mouth daily. 08/04/17   Robbie Lis M, PA-C  amphetamine-dextroamphetamine (ADDERALL XR) 30 MG 24 hr capsule Take 1 capsule (30 mg total) by mouth every morning. 08/12/17   Donato Schultz, DO  aspirin 81 MG tablet Take 81 mg by mouth daily.    [provider]  ciprofloxacin (CIPRO) 500 MG tablet Take 1 tablet (500 mg total) by mouth 2 (two) times daily. 07/08/17   Rolland Porter, MD  clonazePAM (KLONOPIN) 0.5 MG tablet TAKE ONE (1) TABLET BY MOUTH 3 TIMES DAILY AS NEEDED FOR ANXIETY 04/15/17   Zola Button, Grayling Congress, DO  furosemide (LASIX) 40 MG tablet Take 40 mg by mouth daily as needed for edema. 07/05/16   [provider]  gabapentin (NEURONTIN) 300 MG capsule Take 300 mg by mouth 3 (three) times daily.    [provider]  methylPREDNISolone (MEDROL DOSEPAK) 4 MG TBPK tablet Take 5 mg by mouth.    [provider]  morphine (MSIR) 15 MG tablet Take 15 mg by mouth every 12 (twelve) hours as needed for severe pain (for pain).    [provider]  Oxycodone HCl 20 MG TABS Take 1 tablet by mouth 4 (four) times daily as needed (pain).  03/06/15   [provider]  SUMAtriptan (IMITREX) 50 MG tablet Take 1 tablet (50 mg total) by mouth every 2 (two) hours as needed for migraine. May repeat in 2 hours if headache persists or recurs. 07/04/17   Donato Schultz, DO  tiZANidine (ZANAFLEX) 4 MG capsule Take 4 mg by mouth 3 (three) times daily as needed for muscle spasms.    [provider]  venlafaxine XR (EFFEXOR-XR) 75 MG 24 hr capsule Take 3 capsules (225 mg total) by mouth daily. 08/05/17   Donato Schultz, DO    Allergies:   Patient has no known allergies.   Social History   Social History  . Marital status: Married    Spouse name: N/A  . Number of children: 2    . Years of education: N/A   Occupational History  . Retired    Social History Main Topics  . Smoking status: Never Smoker  . Smokeless tobacco: Never Used  . Alcohol use No  . Drug use: No  . Sexual activity: No   Other Topics Concern  . None   Social History Narrative  . None     Family History:  The patient's family history includes COPD in her mother; Emphysema in her mother; Heart attack in her mother; Other in her mother. She was adopted.   ROS:   Please see the history of present illness.  ROS All other systems reviewed and are negative.   PHYSICAL EXAM:   VS:  BP 124/78   Pulse 88   Ht  (1.651 m)   Wt 187 lb 6.4 oz (85 kg)   BMI 31.18 kg/m    GEN: Well nourished, well developed, in no acute distress  HEENT: normal  Neck: no JVD, carotid bruits, or masses Cardiac: RRR; no murmurs, rubs, or gallops,no edema  Respiratory:  clear to auscultation bilaterally, normal work of breathing GI: soft, nontender, nondistended, + BS MS: no deformity or atrophy  Skin: warm and dry, no rash Neuro:  Alert and Oriented x 3, Strength and sensation are intact Psych: euthymic mood, full affect  Wt Readings from Last 3 Encounters:  08/23/17 187 lb 6.4 oz (85 kg)  07/17/17 186 lb (84.4 kg)  07/14/17 186 lb (84.4 kg)      Studies/Labs Reviewed:   EKG:  EKG is ordered today.  The ekg ordered today demonstrates SR  Recent Labs: 07/17/2017: ALT 10; BUN 18; Creatinine, Ser 0.94; Hemoglobin 13.9; Platelets 234; Potassium 3.5; Sodium 139   Lipid Panel    Component Value Date/Time   CHOL 182 10/28/2009 0000   TRIG 155.0 (H) 10/28/2009 0000   HDL 61.30 10/28/2009 0000   CHOLHDL 3 10/28/2009 0000   VLDL 31.0 10/28/2009 0000   LDLCALC 90 10/28/2009 0000   LDLDIRECT 129.1 07/17/2007 0840    Additional studies/ records that were reviewed today include:    Stress test 08/20/2016  Nuclear stress EF: 68%.  There was no ST segment deviation noted during stress.  No  T wave inversion was noted during stress.  This is a low risk study.  The study is normal.   Low risk stress nuclear study with normal perfusion and normal left ventricular regional and global systolic function.  Echo 08/20/2016 Study Conclusions  - Left ventricle: The cavity size was normal. Systolic function was   normal. The estimated ejection fraction was in the range of 60%   to 65%. Wall motion was normal; there were no regional wall   motion abnormalities. Doppler parameters are consistent with   abnormal left ventricular relaxation (grade 1 diastolic   dysfunction). - Aortic valve: Trileaflet; mildly thickened, mildly calcified   leaflets. - Aorta: Ascending aortic diameter: 38 mm (S). - Ascending aorta: The ascending aorta was mildly dilated. - Mitral valve: Calcified annulus. Mildly thickened leaflets . - Pericardium, extracardiac: Hepatic cyst noted. Consider dedicated   abdominal ultrasound for further illustration.  Impressions:  - Compared to the prior study, there has been no significant   interval change. ASSESSMENT & PLAN:    1. Chronic diastolic CHF - Euvolemic. Her dyspnea is likely from URI recently. Now improving. Continue when necessary Lasix.  2. HTN - Stable and well controlled on current regimen.  3. Back pain - Recent CT of spine is abnormal. Recent is scheduled to have some kind of procedure in clinic. Unable to provide name. She is cleared with low risk if required cardiac clearance.     Medication Adjustments/Labs and Tests Ordered: Current medicines are reviewed at length with the patient today.  Concerns regarding medicines are outlined above.  Medication changes, Labs and Tests ordered today are listed in the Patient Instructions below. Patient Instructions  Medication Instructions:  Your physician recommends that you continue on your current medications as directed. Please refer to the Current Medication list given to you today.  --  If you need a refill on your  cardiac medications before your next appointment, please call your pharmacy. --  Labwork: None ordered  Testing/Procedures: None ordered  Follow-Up: Your physician wants you to follow-up in: 4 months with Dr. Delton See.     Thank you for choosing [facility]!!   Sigurd Sos, RN [phone]  Any Other Special Instructions Will Be Listed Below (If Applicable).            Lorelei Pont, [place]  08/23/2017 10:24 AM    [facility] [address] [place], [place], [place]  [address] Phone: [phone]; Fax: [phone]

## 2017-08-23 ENCOUNTER — Encounter: Payer: Self-pay | Admitting: Physician Assistant

## 2017-08-23 ENCOUNTER — Ambulatory Visit (INDEPENDENT_AMBULATORY_CARE_PROVIDER_SITE_OTHER): Payer: Medicare Other | Admitting: Physician Assistant

## 2017-08-23 VITALS — BP 124/78 | HR 88 | Ht 65.0 in | Wt 187.4 lb

## 2017-08-23 DIAGNOSIS — M549 Dorsalgia, unspecified: Secondary | ICD-10-CM

## 2017-08-23 DIAGNOSIS — I10 Essential (primary) hypertension: Secondary | ICD-10-CM

## 2017-08-23 DIAGNOSIS — R002 Palpitations: Secondary | ICD-10-CM

## 2017-08-23 NOTE — Patient Instructions (Signed)
Medication Instructions:  Your physician recommends that you continue on your current medications as directed. Please refer to the Current Medication list given to you today.  -- If you need a refill on your cardiac medications before your next appointment, please call your pharmacy. --  Labwork: None ordered  Testing/Procedures: None ordered  Follow-Up: Your physician wants you to follow-up in: 4 months with Dr. Delton See.     Thank you for choosing [facility]!!   Sigurd Sos, RN [phone]  Any Other Special Instructions Will Be Listed Below (If Applicable).

## 2017-09-14 ENCOUNTER — Telehealth: Payer: Self-pay | Admitting: Family Medicine

## 2017-09-14 DIAGNOSIS — F988 Other specified behavioral and emotional disorders with onset usually occurring in childhood and adolescence: Secondary | ICD-10-CM

## 2017-09-14 NOTE — Telephone Encounter (Signed)
Patient requesting refill for adderall  Database ran and is on your desk for review  Last filled per database: 08/15/17 Last written: 08/12/17 Last ov: 07/04/17 Next ov: nothing scheduled Contract: 01/12/17 UDS: given on 01/12/17 but no results in chart

## 2017-09-14 NOTE — Telephone Encounter (Signed)
Relation to ZO:XWRUpt:self Call back [phone] Pharmacy:  Reason for call:  Patient requesting amphetamine-dextroamphetamine (ADDERALL XR) 30 MG 24 hr capsule refill, please advise

## 2017-09-15 MED ORDER — AMPHETAMINE-DEXTROAMPHET ER 30 MG PO CP24: 30.0000 mg | ORAL_CAPSULE | ORAL | 0 refills | Status: DC

## 2017-09-15 NOTE — Telephone Encounter (Signed)
Left message on machine rx is ready for pickup.

## 2017-09-15 NOTE — Telephone Encounter (Signed)
Pt called wanting to know if rx was ready to pick up, please call pt when rx ready.

## 2017-09-21 ENCOUNTER — Telehealth: Payer: Self-pay | Admitting: Family Medicine

## 2017-09-21 DIAGNOSIS — R519 Headache, unspecified: Secondary | ICD-10-CM

## 2017-09-21 DIAGNOSIS — F411 Generalized anxiety disorder: Secondary | ICD-10-CM

## 2017-09-21 DIAGNOSIS — R51 Headache: Principal | ICD-10-CM

## 2017-09-21 NOTE — Telephone Encounter (Signed)
Self   Refill request for SUMAtriptan and clonazePAM     Pharmacy: [facility] - [place], [place] - [address]

## 2017-09-22 MED ORDER — SUMATRIPTAN SUCCINATE 50 MG PO TABS: 50.0000 mg | ORAL_TABLET | ORAL | 1 refills | Status: DC | PRN

## 2017-09-22 NOTE — Telephone Encounter (Signed)
Patient requesting refill on clonazepam  Database ran on 09/14/17 and is in chart  Last written: 04/15/17 Last ov: 07/04/17 Next ov: nothing scheduled Contract: 01/12/17 UDS: given, but results not in chart.   Sumatriptan refilled and sent to pharmacy

## 2017-09-22 NOTE — Telephone Encounter (Signed)
Refill x1   1 refill 

## 2017-09-26 MED ORDER — CLONAZEPAM 0.5 MG PO TABS: ORAL_TABLET | ORAL | 1 refills | Status: DC

## 2017-09-26 NOTE — Telephone Encounter (Signed)
rx faxed in and left message on patients voicemail

## 2017-10-13 ENCOUNTER — Other Ambulatory Visit: Payer: Self-pay | Admitting: Family Medicine

## 2017-10-13 ENCOUNTER — Telehealth: Payer: Self-pay | Admitting: Family Medicine

## 2017-10-13 DIAGNOSIS — F988 Other specified behavioral and emotional disorders with onset usually occurring in childhood and adolescence: Secondary | ICD-10-CM

## 2017-10-13 MED ORDER — AMPHETAMINE-DEXTROAMPHET ER 30 MG PO CP24: 30.0000 mg | ORAL_CAPSULE | ORAL | 0 refills | Status: DC

## 2017-10-13 MED ORDER — AMPHETAMINE-DEXTROAMPHET ER 30 MG PO CP24
30.0000 mg | ORAL_CAPSULE | ORAL | 0 refills | Status: DC
Start: 2017-10-13 — End: 2018-02-20

## 2017-10-13 NOTE — Telephone Encounter (Signed)
Refilled

## 2017-10-13 NOTE — Telephone Encounter (Signed)
Copied from CRM [id number]. Topic: Quick Communication - See Telephone Encounter >> Oct 13, 2017  9:19 AM Oneal GroutSebastian, Jennifer S wrote: CRM for notification. See Telephone encounter for:  Requesting refill on amphetamine-dextroamphetamine (ADDERALL XR) 30 MG 24 hr capsule. Only has 1 pill left 10/13/17.

## 2017-10-13 NOTE — Telephone Encounter (Signed)
Patient requesting refill for adderall  Database ran last request 09/15/17  Last written: 09/15/17 Last ov: 07/04/17 Next ov: nothing scheduled Contract: 01/12/17 UDS: given on 01/12/17 but no results in chart

## 2017-10-13 NOTE — Telephone Encounter (Signed)
Adderall refill. Last refill on 09/15/17. Last OV on 07/14/17.

## 2017-11-07 ENCOUNTER — Telehealth: Payer: Self-pay | Admitting: Family Medicine

## 2017-11-07 DIAGNOSIS — F411 Generalized anxiety disorder: Secondary | ICD-10-CM

## 2017-11-07 NOTE — Telephone Encounter (Signed)
Copied from CRM [phone]. Topic: Quick Communication - Rx Refill/Question >> Nov 07, 2017  1:46 PM Leafy Roobinson, Norma J wrote: Has the patient contacted their pharmacy? no  (Agent: If no, request that the patient contact the pharmacy for the refill.) pt needs new rx generic adderall 30 mg xr   Preferred Pharmacy (with phone number or street name): [facility]   Agent: Please be advised that RX refills may take up to 3 business days. We ask that you follow-up with your pharmacy.

## 2017-11-09 MED ORDER — VENLAFAXINE HCL ER 75 MG PO CP24: 225.0000 mg | ORAL_CAPSULE | Freq: Every day | ORAL | 0 refills | Status: DC

## 2017-11-09 NOTE — Telephone Encounter (Signed)
Patient notified that rx was sent in  

## 2017-11-09 NOTE — Telephone Encounter (Signed)
Copied from CRM [phone]. Topic: Quick Communication - Rx Refill/Question >> Nov 09, 2017  9:47 AM Percival SpanishKennedy, Cheryl W wrote: Has the patient contacted their pharmacy  yes       (Agent: If no, request that the patient contact the pharmacy for the refill  venlafaxine XR (EFFEXOR-XR) 75 MG 24 hr capsule  Preferred Pharmacy (with phone number or street name [facility]   Agent: Please be advised that RX refills may take up to 3 business days. We ask that you follow-up with your pharmacy.

## 2017-11-10 ENCOUNTER — Telehealth: Payer: Self-pay | Admitting: Family Medicine

## 2017-11-10 NOTE — Telephone Encounter (Signed)
Adderall refill 

## 2017-11-10 NOTE — Telephone Encounter (Signed)
Copied from CRM [id number]. Topic: Quick Communication - See Telephone Encounter >> Nov 10, 2017  5:22 PM Cipriano BunkerLambe, Annette S wrote: CRM for notification. See Telephone encounter for:  Prescription request for generic Adderall  Please call patient if needed to pick up   [facility] - [place], [place] - [address] [place] [id number] Phone: [phone] Fax: [phone]   11/10/17.

## 2017-11-11 NOTE — Telephone Encounter (Signed)
Pharmacy has other 2 rxs on file.  Patient notified to call pharmacy when she is ready for refill.

## 2017-12-20 ENCOUNTER — Other Ambulatory Visit: Payer: Self-pay | Admitting: Family Medicine

## 2017-12-20 ENCOUNTER — Telehealth: Payer: Self-pay | Admitting: *Deleted

## 2017-12-20 DIAGNOSIS — R519 Headache, unspecified: Secondary | ICD-10-CM

## 2017-12-20 DIAGNOSIS — R51 Headache: Principal | ICD-10-CM

## 2017-12-20 MED ORDER — SUMATRIPTAN SUCCINATE 50 MG PO TABS: 50.0000 mg | ORAL_TABLET | ORAL | 1 refills | Status: DC | PRN

## 2017-12-20 NOTE — Telephone Encounter (Signed)
Request:Sumatriptan 50mg -Take 1 tablet at onset of headache,may repeat in 2 hours if needed. Last refill:09/22/17;#10,1 Last OV:07/14/17-acute visit with Dr. Carmelia RollerWendling. Please advise.//AB/CMA

## 2017-12-26 ENCOUNTER — Ambulatory Visit: Payer: Medicare Other | Admitting: Cardiology

## 2018-01-02 ENCOUNTER — Ambulatory Visit: Payer: Medicare Other | Admitting: Cardiology

## 2018-02-01 ENCOUNTER — Telehealth: Payer: Self-pay | Admitting: *Deleted

## 2018-02-01 NOTE — Telephone Encounter (Signed)
Can not take klonopin with morphine and oxy

## 2018-02-01 NOTE — Telephone Encounter (Signed)
[facility] Requesting refill for clonazepam  Patient is also currently taking morphine and oxycodone per Database  Last filled per database: 12/12/17 Last written: 09/26/17 Last ov: 07/04/17 Next ov: none Contract: due UDS: due

## 2018-02-02 ENCOUNTER — Other Ambulatory Visit: Payer: Self-pay | Admitting: *Deleted

## 2018-02-02 NOTE — Telephone Encounter (Signed)
Patient agrees to cut down to 1/2 to 1 tablet at least twice a day.  She will be in on Tuesday for follow up and we will collect UDS and contract at that time.  Can you send in new rx.

## 2018-02-07 ENCOUNTER — Ambulatory Visit: Payer: Medicare Other | Admitting: Family Medicine

## 2018-02-07 ENCOUNTER — Encounter: Payer: Self-pay | Admitting: Family Medicine

## 2018-02-07 VITALS — BP 132/88 | HR 94 | Temp 98.2°F | Resp 16 | Wt 160.8 lb

## 2018-02-07 DIAGNOSIS — F988 Other specified behavioral and emotional disorders with onset usually occurring in childhood and adolescence: Secondary | ICD-10-CM

## 2018-02-07 DIAGNOSIS — Z0289 Encounter for other administrative examinations: Secondary | ICD-10-CM

## 2018-02-07 DIAGNOSIS — F411 Generalized anxiety disorder: Secondary | ICD-10-CM

## 2018-02-07 DIAGNOSIS — F341 Dysthymic disorder: Secondary | ICD-10-CM

## 2018-02-07 DIAGNOSIS — Z79899 Other long term (current) drug therapy: Secondary | ICD-10-CM

## 2018-02-07 DIAGNOSIS — R1310 Dysphagia, unspecified: Secondary | ICD-10-CM | POA: Diagnosis not present

## 2018-02-07 MED ORDER — OMEPRAZOLE 20 MG PO CPDR: 20.0000 mg | DELAYED_RELEASE_CAPSULE | Freq: Every day | ORAL | 3 refills | Status: DC

## 2018-02-07 MED ORDER — VENLAFAXINE HCL ER 75 MG PO CP24: 225.0000 mg | ORAL_CAPSULE | Freq: Every day | ORAL | 3 refills | Status: DC

## 2018-02-07 MED ORDER — CLONAZEPAM 0.5 MG PO TABS: 0.2500 mg | ORAL_TABLET | Freq: Every day | ORAL | 1 refills | Status: DC | PRN

## 2018-02-07 NOTE — Patient Instructions (Signed)

## 2018-02-07 NOTE — Assessment & Plan Note (Signed)
Stable. Refill meds

## 2018-02-07 NOTE — Progress Notes (Signed)
Patient ID: Molly HawthorneRita C Fischer, female    DOB: 08/03/1946  Age: 72 y.o. MRN: [id number]    Subjective:  Subjective  HPI [name] presents for f/u add and anxiety.  She needs refills on her meds and c/o troble swallowing --- she feels food gets stuck but is not sure if it has to do with her not being able to stand up straight.  She is waiting for a brace from Dr Noel Geroldcohen.  No gerd symptoms.   Review of Systems  Constitutional: Positive for fatigue. Negative for activity change, appetite change and unexpected weight change.  Respiratory: Negative for cough and shortness of breath.   Cardiovascular: Negative for chest pain and palpitations.  Gastrointestinal: Positive for nausea and vomiting.  Psychiatric/Behavioral: Negative for behavioral problems and dysphoric mood. The patient is not nervous/anxious.     History Past Medical History:  Diagnosis Date  . ADD (attention deficit disorder) 05/24/2012  . ANEMIA, MILD 06/26/2008   Qualifier: Diagnosis of  By: Janit BernLowne DO, Ludene Stokke    . Anxiety   . Arthritis    "joints, back" (10/02/2013)  . Chronic back pain    "all over my back" (10/02/2013)  . Congestive heart disease (HCC)    "I have the beginning signs" (10/02/2013)  . Crushing injury of arm, right 02/06/1989's   "it was crushed; wore cast from fingers to top of my shoulder" (11/25/2  . Crushing injury of left wrist 05/11/1989's  . DIZZINESS 12/31/2009   Qualifier: Diagnosis of  By: Oswald HillockGraham-Oliver, Karen    . Fibromyalgia   . GERD 12/03/2008   Qualifier: Diagnosis of  By: Freddy JakschHooks, Phyllis    . HYPERLIPIDEMIA 05/15/2007   Qualifier: Diagnosis of  By: Janit BernLowne DO, Akeen Ledyard ; pt denies this hx on 10/02/2013  . Hypertension   . Idiopathic scoliosis 04/19/2013  . Migraines    "once/month" (10/02/2013)  . Osteoporosis, unspecified 04/19/2013  . Other vitamin B12 deficiency anemia 10/30/2009   Qualifier: Diagnosis of  By: Floydene FlockHoops, Regina    . PALPITATIONS, RECURRENT 12/12/2009   Qualifier: Diagnosis of  By:  Janit BernLowne DO, Janilah Hojnacki    . Pica 12/28/2007   Qualifier: Diagnosis of  By: Janit BernLowne DO, Jasun Gasparini    . Pneumonia 10/02/2013   "had onsets of it before; not full blown til now" (10/02/2013)  . PSTPRC STATUS, BARIATRIC SURGERY 05/15/2007   Qualifier: Diagnosis of  By: Janit BernLowne DO, Debera Sterba    . Pulmonary nodules/lesions, multiple   . TREMOR, ESSENTIAL 12/12/2009   Qualifier: Diagnosis of  By: Janit BernLowne DO, Genise Strack    . Unspecified Myalgia and Myositis 05/15/2007   Centricity Description: FIBROMYALGIA Qualifier: Diagnosis of  By: Janit BernLowne DO, Marletta Bousquet   Centricity Description: MYALGIA Qualifier: Diagnosis of  By: Janit BernLowne DO, Raetta Agostinelli      She has a past surgical history that includes Gastric bypass (~ 2003); Abdominal hysterectomy (1980's); Knee arthroscopy (Right, 2002); Tonsillectomy and adenoidectomy (1955); Myomectomy (1980's); Appendectomy (1980's); Tubal ligation (1980's); Reduction mammaplasty (Bilateral, 2001); Refractive surgery (Bilateral, 2004); Closed reduction hand fracture (Right, 1990's); Back surgery; and Spinal cord stimulator implant.   Her family history includes COPD in her mother; Emphysema in her mother; Heart attack in her mother; Other in her mother. She was adopted.She reports that she has never smoked. She has never used smokeless tobacco. She reports that she does not drink alcohol or use drugs.  Current Outpatient Medications on File Prior to Visit  Medication Sig Dispense Refill  . amphetamine-dextroamphetamine (ADDERALL XR) 30 MG 24 hr capsule  Take 1 capsule (30 mg total) by mouth every morning. 30 capsule 0  . amphetamine-dextroamphetamine (ADDERALL XR) 30 MG 24 hr capsule Take 1 capsule (30 mg total) by mouth every morning. 30 capsule 0  . amphetamine-dextroamphetamine (ADDERALL XR) 30 MG 24 hr capsule Take 1 capsule (30 mg total) by mouth every morning. 30 capsule 0  . morphine (MSIR) 15 MG tablet Take 15 mg by mouth every 12 (twelve) hours as needed for severe pain (for pain).    . Oxycodone HCl 20  MG TABS Take 1 tablet by mouth 4 (four) times daily as needed (pain).     . SUMAtriptan (IMITREX) 50 MG tablet Take 1 tablet (50 mg total) by mouth every 2 (two) hours as needed for migraine. May repeat in 2 hours if headache persists or recurs. 10 tablet 1   No current facility-administered medications on file prior to visit.      Objective:  Objective  Physical Exam  Constitutional: She is oriented to person, place, and time. She appears well-developed and well-nourished.  HENT:  Head: Normocephalic and atraumatic.  Eyes: Conjunctivae and EOM are normal.  Neck: Normal range of motion. Neck supple. No JVD present. Carotid bruit is not present. No thyromegaly present.  Cardiovascular: Normal rate, regular rhythm and normal heart sounds.  No murmur heard. Pulmonary/Chest: Effort normal and breath sounds normal. No respiratory distress. She has no wheezes. She has no rales. She exhibits no tenderness.  Abdominal: She exhibits no distension and no mass. There is no tenderness. There is no rebound and no guarding.  Musculoskeletal: She exhibits no edema.  Neurological: She is alert and oriented to person, place, and time.  Psychiatric: She has a normal mood and affect. Her behavior is normal. Judgment and thought content normal.  Nursing note and vitals reviewed.  BP 132/88   Pulse 94   Temp 98.2 F (36.8 C) (Oral)   Resp 16   Wt 160 lb 12.8 oz (72.9 kg)   SpO2 95%   BMI 26.76 kg/m  Wt Readings from Last 3 Encounters:  02/07/18 160 lb 12.8 oz (72.9 kg)  08/23/17 187 lb 6.4 oz (85 kg)  07/17/17 186 lb (84.4 kg)     Lab Results  Component Value Date   WBC 5.9 07/17/2017   HGB 13.9 07/17/2017   HCT 41.8 07/17/2017   PLT 234 07/17/2017   GLUCOSE 89 07/17/2017   CHOL 182 10/28/2009   TRIG 155.0 (H) 10/28/2009   HDL 61.30 10/28/2009   LDLDIRECT 129.1 07/17/2007   LDLCALC 90 10/28/2009   ALT 10 (L) 07/17/2017   AST 21 07/17/2017   NA 139 07/17/2017   K 3.5 07/17/2017   CL  102 07/17/2017   CREATININE 0.94 07/17/2017   BUN 18 07/17/2017   CO2 30 07/17/2017   TSH 1.26 04/22/2014   INR 1.14 03/07/2015   HGBA1C 5.3 04/22/2014    Ct Thoracic Spine W Contrast  Result Date: 08/08/2017 CLINICAL DATA:  Low back pain. Mid back pain. LEFT flank pain. Spinal cord stimulator. FLUOROSCOPY TIME:  1 minutes and 16 seconds corresponding to a Dose Area Product of 572.25 Gy*m2 PROCEDURE: LUMBAR PUNCTURE FOR THORACIC AND LUMBAR MYELOGRAM After thorough discussion of risks and benefits of the procedure including bleeding, infection, injury to nerves, blood vessels, adjacent structures as well as headache and CSF leak, written and oral informed consent was obtained. Consent was obtained by Dr. Davonna Belling. Patient was positioned prone on the fluoroscopy table. Local anesthesia was provided  with 1% lidocaine without epinephrine after prepped and draped in the usual sterile fashion. Puncture was performed at L3-L4 using a 3 1/2 inch 20 gauge spinal needle via midline approach. Using a single pass through the dura, the needle was placed within the thecal sac, with return of clear CSF. 10 mL of Isovue-M 300 was injected into the thecal sac, with normal opacification of the nerve roots and cauda equina consistent with free flow within the subarachnoid space. The patient was then moved to the trendelenburg position and contrast flowed into the Thoracic spine region. I personally performed the lumbar puncture and administered the intrathecal contrast. I also personally supervised acquisition of the myelogram images. TECHNIQUE: Contiguous axial images were obtained through the Thoracic and Lumbar spine after the intrathecal infusion of infusion. Coronal and sagittal reconstructions were obtained of the axial image sets. FINDINGS: THORACIC AND LUMBAR MYELOGRAM FINDINGS: LUMBAR: Good opacification lumbar subarachnoid space. Slight extradural defect at L5-S1 on the LEFT, compressing the LEFT S1 nerve  root. Asymmetric loss of interspace height at L4-5 on the RIGHT, without L5 nerve root impingement. Chronic compression deformity of L2 and L3, with mild stenosis at the L2-3 level related to ventral soft tissue defect and posterior element hypertrophy. There is 4 mm facet mediated anterolisthesis at both L4-5 and L5-S1 with patient recumbent for myelography. With patient standing, anterolisthesis is redemonstrated, without significant change between upright neutral, flexion and extension. THORACIC: Fair opacification of the thoracic subarachnoid space. Dorsal column stimulator leads enter the spinal canal at T8-T9. There is a chronic compression fracture T8, and T12. No acute compression deformity is evident. There is no spinal stenosis. There is no significant ventral defect. CT THORACIC MYELOGRAM FINDINGS: Alignment: Anatomic. No significant anterolisthesis, retrolisthesis, or scoliosis. Vertebrae: Coarsened trabeculae suggesting osteoporosis. Correlate with DEXA scan for bone mineral density. Cord: Normal morphology. The dorsal column stimulator electrode array is well positioned opposite the T7 and T8 segments. Paraspinal tissues: Atherosclerosis. No pleural effusion. No lung mass. No superficial soft tissue abnormalities of the back. Disc levels: No significant disc protrusion or spinal stenosis. CT LUMBAR MYELOGRAM FINDINGS: Segmentation: Normal. Alignment: Degenerative anterolisthesis of 4 mm at L4-5 and L5-S1. Some degenerative scoliosis convex RIGHT in the upper lumbar region related to compression fractures. Vertebrae: Chronic appearing compression fractures at L2 and L3 based on prior imaging. Possible acute to subacute compression fracture of L1, no significant anterior loss of height but with depression of both superior and inferior endplates. Conus medullaris: Normal in size. Abnormally low termination mid L3. Slight increased thickness of the filum terminale, but no evidence for fat. Paraspinal  tissues: No evidence for hydronephrosis or paravertebral mass. Disc levels: L1-L2: Mild stenosis related to slight retropulsion of bone, and ventral soft tissue, possible disc material or prominent vertebral plexus. No conus compression. L2-L3: Mild-to-moderate stenosis related to osseous spur, chronic compression, and posterior element hypertrophy. BILATERAL L3 nerve root impingement is likely. In addition, there is foraminal narrowing, with probable significant BILATERAL L2 nerve root impingement. L3-L4: Trace retrolisthesis. Solid appearing arthrodesis, probably spontaneous across the L3-4 facets. L4-L5: 4 mm anterolisthesis. Posterior element hypertrophy. Vacuum phenomenon. No central soft disc protrusion. Mild stenosis. Slight subarticular zone and foraminal zone narrowing could affect the L4 and L5 nerve roots. L5-S1: Facet arthropathy contributes to 4 mm anterolisthesis. There is annular bulging. Vacuum phenomenon. Only slight subarticular zone narrowing on the LEFT, much improved compared with previous myelogram Foraminal narrowing, worse on the RIGHT, could affect either L5 nerve root. Compared with the  previous myelogram, the disc extrusion at L5-S1 on the LEFT is markedly improved/resolved. The findings of spondylosis are fairly similar. The compression fracture at L1 was not present at that time. IMPRESSION: THORACIC: Chronic appearing changes of osteopenia related T8 and T12 compression deformity without significant stenosis or neural impingement. Unremarkable appearing dorsal column stimulator array centered at T7 and T8. LUMBAR: No significant dynamic instability on standing flexion extension radiographs. Chronic compression deformities of L2 and L3, similar to prior myelogram. Acute to subacute appearing compression deformity of L1, not present previously. Consider bone scan to evaluate for metabolic bone turnover. If the L1 vertebral body shows increased scintigraphic activity, the patient may be a  candidate for vertebral augmentation. Regression of the previously identified large extrusion at L5-S1 on the LEFT. Only slight LEFT subarticular zone narrowing, but BILATERAL foraminal narrowing, worse on the RIGHT, could affect the L5 nerve roots. 4 mm facet mediated slip at the L5-S1 level. Mild stenosis at L4-5, related to 4 mm slip and posterior element hypertrophy. Mild to moderate stenosis at L2-3, multifactorial, related to compression deformity and posterior element hypertrophy. BILATERAL L2 and L3 nerve root impingement. It is possible that this is the symptomatic level which contributes to the patient's LEFT flank pain. Abnormal low termination conus at L3. Electronically Signed   By: Elsie Stain M.D.   On: 08/08/2017 12:49   Ct Lumbar Spine W Contrast  Result Date: 08/08/2017 CLINICAL DATA:  Low back pain. Mid back pain. LEFT flank pain. Spinal cord stimulator. FLUOROSCOPY TIME:  1 minutes and 16 seconds corresponding to a Dose Area Product of 572.25 Gy*m2 PROCEDURE: LUMBAR PUNCTURE FOR THORACIC AND LUMBAR MYELOGRAM After thorough discussion of risks and benefits of the procedure including bleeding, infection, injury to nerves, blood vessels, adjacent structures as well as headache and CSF leak, written and oral informed consent was obtained. Consent was obtained by Dr. Davonna Belling. Patient was positioned prone on the fluoroscopy table. Local anesthesia was provided with 1% lidocaine without epinephrine after prepped and draped in the usual sterile fashion. Puncture was performed at L3-L4 using a 3 1/2 inch 20 gauge spinal needle via midline approach. Using a single pass through the dura, the needle was placed within the thecal sac, with return of clear CSF. 10 mL of Isovue-M 300 was injected into the thecal sac, with normal opacification of the nerve roots and cauda equina consistent with free flow within the subarachnoid space. The patient was then moved to the trendelenburg position and  contrast flowed into the Thoracic spine region. I personally performed the lumbar puncture and administered the intrathecal contrast. I also personally supervised acquisition of the myelogram images. TECHNIQUE: Contiguous axial images were obtained through the Thoracic and Lumbar spine after the intrathecal infusion of infusion. Coronal and sagittal reconstructions were obtained of the axial image sets. FINDINGS: THORACIC AND LUMBAR MYELOGRAM FINDINGS: LUMBAR: Good opacification lumbar subarachnoid space. Slight extradural defect at L5-S1 on the LEFT, compressing the LEFT S1 nerve root. Asymmetric loss of interspace height at L4-5 on the RIGHT, without L5 nerve root impingement. Chronic compression deformity of L2 and L3, with mild stenosis at the L2-3 level related to ventral soft tissue defect and posterior element hypertrophy. There is 4 mm facet mediated anterolisthesis at both L4-5 and L5-S1 with patient recumbent for myelography. With patient standing, anterolisthesis is redemonstrated, without significant change between upright neutral, flexion and extension. THORACIC: Fair opacification of the thoracic subarachnoid space. Dorsal column stimulator leads enter the spinal canal at T8-T9.  There is a chronic compression fracture T8, and T12. No acute compression deformity is evident. There is no spinal stenosis. There is no significant ventral defect. CT THORACIC MYELOGRAM FINDINGS: Alignment: Anatomic. No significant anterolisthesis, retrolisthesis, or scoliosis. Vertebrae: Coarsened trabeculae suggesting osteoporosis. Correlate with DEXA scan for bone mineral density. Cord: Normal morphology. The dorsal column stimulator electrode array is well positioned opposite the T7 and T8 segments. Paraspinal tissues: Atherosclerosis. No pleural effusion. No lung mass. No superficial soft tissue abnormalities of the back. Disc levels: No significant disc protrusion or spinal stenosis. CT LUMBAR MYELOGRAM FINDINGS:  Segmentation: Normal. Alignment: Degenerative anterolisthesis of 4 mm at L4-5 and L5-S1. Some degenerative scoliosis convex RIGHT in the upper lumbar region related to compression fractures. Vertebrae: Chronic appearing compression fractures at L2 and L3 based on prior imaging. Possible acute to subacute compression fracture of L1, no significant anterior loss of height but with depression of both superior and inferior endplates. Conus medullaris: Normal in size. Abnormally low termination mid L3. Slight increased thickness of the filum terminale, but no evidence for fat. Paraspinal tissues: No evidence for hydronephrosis or paravertebral mass. Disc levels: L1-L2: Mild stenosis related to slight retropulsion of bone, and ventral soft tissue, possible disc material or prominent vertebral plexus. No conus compression. L2-L3: Mild-to-moderate stenosis related to osseous spur, chronic compression, and posterior element hypertrophy. BILATERAL L3 nerve root impingement is likely. In addition, there is foraminal narrowing, with probable significant BILATERAL L2 nerve root impingement. L3-L4: Trace retrolisthesis. Solid appearing arthrodesis, probably spontaneous across the L3-4 facets. L4-L5: 4 mm anterolisthesis. Posterior element hypertrophy. Vacuum phenomenon. No central soft disc protrusion. Mild stenosis. Slight subarticular zone and foraminal zone narrowing could affect the L4 and L5 nerve roots. L5-S1: Facet arthropathy contributes to 4 mm anterolisthesis. There is annular bulging. Vacuum phenomenon. Only slight subarticular zone narrowing on the LEFT, much improved compared with previous myelogram Foraminal narrowing, worse on the RIGHT, could affect either L5 nerve root. Compared with the previous myelogram, the disc extrusion at L5-S1 on the LEFT is markedly improved/resolved. The findings of spondylosis are fairly similar. The compression fracture at L1 was not present at that time. IMPRESSION: THORACIC: Chronic  appearing changes of osteopenia related T8 and T12 compression deformity without significant stenosis or neural impingement. Unremarkable appearing dorsal column stimulator array centered at T7 and T8. LUMBAR: No significant dynamic instability on standing flexion extension radiographs. Chronic compression deformities of L2 and L3, similar to prior myelogram. Acute to subacute appearing compression deformity of L1, not present previously. Consider bone scan to evaluate for metabolic bone turnover. If the L1 vertebral body shows increased scintigraphic activity, the patient may be a candidate for vertebral augmentation. Regression of the previously identified large extrusion at L5-S1 on the LEFT. Only slight LEFT subarticular zone narrowing, but BILATERAL foraminal narrowing, worse on the RIGHT, could affect the L5 nerve roots. 4 mm facet mediated slip at the L5-S1 level. Mild stenosis at L4-5, related to 4 mm slip and posterior element hypertrophy. Mild to moderate stenosis at L2-3, multifactorial, related to compression deformity and posterior element hypertrophy. BILATERAL L2 and L3 nerve root impingement. It is possible that this is the symptomatic level which contributes to the patient's LEFT flank pain. Abnormal low termination conus at L3. Electronically Signed   By: Elsie Stain M.D.   On: 08/08/2017 12:49   Dg Myelography Lumbar Inj Multi Region  Result Date: 08/08/2017 CLINICAL DATA:  Low back pain. Mid back pain. LEFT flank pain. Spinal cord stimulator. FLUOROSCOPY  TIME:  1 minutes and 16 seconds corresponding to a Dose Area Product of 572.25 Gy*m2 PROCEDURE: LUMBAR PUNCTURE FOR THORACIC AND LUMBAR MYELOGRAM After thorough discussion of risks and benefits of the procedure including bleeding, infection, injury to nerves, blood vessels, adjacent structures as well as headache and CSF leak, written and oral informed consent was obtained. Consent was obtained by Dr. Davonna Belling. Patient was positioned  prone on the fluoroscopy table. Local anesthesia was provided with 1% lidocaine without epinephrine after prepped and draped in the usual sterile fashion. Puncture was performed at L3-L4 using a 3 1/2 inch 20 gauge spinal needle via midline approach. Using a single pass through the dura, the needle was placed within the thecal sac, with return of clear CSF. 10 mL of Isovue-M 300 was injected into the thecal sac, with normal opacification of the nerve roots and cauda equina consistent with free flow within the subarachnoid space. The patient was then moved to the trendelenburg position and contrast flowed into the Thoracic spine region. I personally performed the lumbar puncture and administered the intrathecal contrast. I also personally supervised acquisition of the myelogram images. TECHNIQUE: Contiguous axial images were obtained through the Thoracic and Lumbar spine after the intrathecal infusion of infusion. Coronal and sagittal reconstructions were obtained of the axial image sets. FINDINGS: THORACIC AND LUMBAR MYELOGRAM FINDINGS: LUMBAR: Good opacification lumbar subarachnoid space. Slight extradural defect at L5-S1 on the LEFT, compressing the LEFT S1 nerve root. Asymmetric loss of interspace height at L4-5 on the RIGHT, without L5 nerve root impingement. Chronic compression deformity of L2 and L3, with mild stenosis at the L2-3 level related to ventral soft tissue defect and posterior element hypertrophy. There is 4 mm facet mediated anterolisthesis at both L4-5 and L5-S1 with patient recumbent for myelography. With patient standing, anterolisthesis is redemonstrated, without significant change between upright neutral, flexion and extension. THORACIC: Fair opacification of the thoracic subarachnoid space. Dorsal column stimulator leads enter the spinal canal at T8-T9. There is a chronic compression fracture T8, and T12. No acute compression deformity is evident. There is no spinal stenosis. There is no  significant ventral defect. CT THORACIC MYELOGRAM FINDINGS: Alignment: Anatomic. No significant anterolisthesis, retrolisthesis, or scoliosis. Vertebrae: Coarsened trabeculae suggesting osteoporosis. Correlate with DEXA scan for bone mineral density. Cord: Normal morphology. The dorsal column stimulator electrode array is well positioned opposite the T7 and T8 segments. Paraspinal tissues: Atherosclerosis. No pleural effusion. No lung mass. No superficial soft tissue abnormalities of the back. Disc levels: No significant disc protrusion or spinal stenosis. CT LUMBAR MYELOGRAM FINDINGS: Segmentation: Normal. Alignment: Degenerative anterolisthesis of 4 mm at L4-5 and L5-S1. Some degenerative scoliosis convex RIGHT in the upper lumbar region related to compression fractures. Vertebrae: Chronic appearing compression fractures at L2 and L3 based on prior imaging. Possible acute to subacute compression fracture of L1, no significant anterior loss of height but with depression of both superior and inferior endplates. Conus medullaris: Normal in size. Abnormally low termination mid L3. Slight increased thickness of the filum terminale, but no evidence for fat. Paraspinal tissues: No evidence for hydronephrosis or paravertebral mass. Disc levels: L1-L2: Mild stenosis related to slight retropulsion of bone, and ventral soft tissue, possible disc material or prominent vertebral plexus. No conus compression. L2-L3: Mild-to-moderate stenosis related to osseous spur, chronic compression, and posterior element hypertrophy. BILATERAL L3 nerve root impingement is likely. In addition, there is foraminal narrowing, with probable significant BILATERAL L2 nerve root impingement. L3-L4: Trace retrolisthesis. Solid appearing arthrodesis, probably spontaneous across  the L3-4 facets. L4-L5: 4 mm anterolisthesis. Posterior element hypertrophy. Vacuum phenomenon. No central soft disc protrusion. Mild stenosis. Slight subarticular zone and  foraminal zone narrowing could affect the L4 and L5 nerve roots. L5-S1: Facet arthropathy contributes to 4 mm anterolisthesis. There is annular bulging. Vacuum phenomenon. Only slight subarticular zone narrowing on the LEFT, much improved compared with previous myelogram Foraminal narrowing, worse on the RIGHT, could affect either L5 nerve root. Compared with the previous myelogram, the disc extrusion at L5-S1 on the LEFT is markedly improved/resolved. The findings of spondylosis are fairly similar. The compression fracture at L1 was not present at that time. IMPRESSION: THORACIC: Chronic appearing changes of osteopenia related T8 and T12 compression deformity without significant stenosis or neural impingement. Unremarkable appearing dorsal column stimulator array centered at T7 and T8. LUMBAR: No significant dynamic instability on standing flexion extension radiographs. Chronic compression deformities of L2 and L3, similar to prior myelogram. Acute to subacute appearing compression deformity of L1, not present previously. Consider bone scan to evaluate for metabolic bone turnover. If the L1 vertebral body shows increased scintigraphic activity, the patient may be a candidate for vertebral augmentation. Regression of the previously identified large extrusion at L5-S1 on the LEFT. Only slight LEFT subarticular zone narrowing, but BILATERAL foraminal narrowing, worse on the RIGHT, could affect the L5 nerve roots. 4 mm facet mediated slip at the L5-S1 level. Mild stenosis at L4-5, related to 4 mm slip and posterior element hypertrophy. Mild to moderate stenosis at L2-3, multifactorial, related to compression deformity and posterior element hypertrophy. BILATERAL L2 and L3 nerve root impingement. It is possible that this is the symptomatic level which contributes to the patient's LEFT flank pain. Abnormal low termination conus at L3. Electronically Signed   By: Elsie Stain M.D.   On: 08/08/2017 12:49     Assessment  & Plan:  Plan  I have discontinued Catheline C. Kepple's aspirin, furosemide, tiZANidine, amitriptyline, gabapentin, and clonazePAM. I have also changed her clonazePAM. Additionally, I am having her start on omeprazole. Lastly, I am having her maintain her Oxycodone HCl, morphine, amphetamine-dextroamphetamine, amphetamine-dextroamphetamine, amphetamine-dextroamphetamine, SUMAtriptan, and venlafaxine XR.  Meds ordered this encounter  Medications  . clonazePAM (KLONOPIN) 0.5 MG tablet    Sig: Take 0.5 tablets (0.25 mg total) by mouth daily as needed for anxiety.    Dispense:  30 tablet    Refill:  1  . venlafaxine XR (EFFEXOR-XR) 75 MG 24 hr capsule    Sig: Take 3 capsules (225 mg total) by mouth daily.    Dispense:  270 capsule    Refill:  3  . omeprazole (PRILOSEC) 20 MG capsule    Sig: Take 1 capsule (20 mg total) by mouth daily.    Dispense:  30 capsule    Refill:  3    Problem List Items Addressed This Visit      Unprioritized   ADD (attention deficit disorder) - Primary   DEPRESSION/ANXIETY    Stable Refill meds      Relevant Medications   venlafaxine XR (EFFEXOR-XR) 75 MG 24 hr capsule   Dysphagia   Relevant Medications   omeprazole (PRILOSEC) 20 MG capsule   Other Relevant Orders   Ambulatory referral to Gastroenterology   Generalized anxiety disorder   Relevant Medications   clonazePAM (KLONOPIN) 0.5 MG tablet   venlafaxine XR (EFFEXOR-XR) 75 MG 24 hr capsule    Other Visit Diagnoses    High risk medication use       Relevant Orders  Pain Mgmt, Profile 8 w/Conf, U      Follow-up: Return in about 6 months (around 08/09/2018), or if symptoms worsen or fail to improve.  Donato Schultz, DO

## 2018-02-08 ENCOUNTER — Other Ambulatory Visit: Payer: Self-pay | Admitting: *Deleted

## 2018-02-08 MED ORDER — AMLODIPINE BESYLATE 5 MG PO TABS: 5.0000 mg | ORAL_TABLET | Freq: Every day | ORAL | 1 refills | Status: DC

## 2018-02-10 ENCOUNTER — Other Ambulatory Visit: Payer: Self-pay | Admitting: *Deleted

## 2018-02-10 MED ORDER — AMLODIPINE BESYLATE 5 MG PO TABS: 5.0000 mg | ORAL_TABLET | Freq: Every day | ORAL | 3 refills | Status: DC

## 2018-02-10 NOTE — Telephone Encounter (Signed)
Pt is calling countless times to request for a refill of her amlodipine 5 mg po daily. Dr Delton SeeNelson saw the pts spouse in clinic today and he requested that this be refilled for his wife, whom is a Dr Delton SeeNelson pt.  Refill of amlodipine 5 mg po daily sent into the pts confirmed pharmacy of choice. Husband gracious for all the assistance.

## 2018-02-12 LAB — PAIN MGMT, PROFILE 8 W/CONF, U
6 ACETYLMORPHINE: NEGATIVE ng/mL (ref <10)
ALPHAHYDROXYALPRAZOLAM: NEGATIVE ng/mL (ref <25)
ALPHAHYDROXYMIDAZOLAM: NEGATIVE ng/mL (ref <50)
AMPHETAMINE: 17571 ng/mL — AB (ref <250)
AMPHETAMINES: POSITIVE ng/mL — AB (ref <500)
Alcohol Metabolites: NEGATIVE ng/mL (ref <500)
Alphahydroxytriazolam: NEGATIVE ng/mL (ref <50)
Aminoclonazepam: 257 ng/mL — ABNORMAL HIGH (ref <25)
BUPRENORPHINE: NEGATIVE ng/mL (ref <2)
Benzodiazepines: POSITIVE ng/mL — AB (ref <100)
Buprenorphine, Urine: NEGATIVE ng/mL (ref <5)
CREATININE: 130.8 mg/dL
Cocaine Metabolite: NEGATIVE ng/mL (ref <150)
Codeine: NEGATIVE ng/mL (ref <50)
HYDROXYETHYLFLURAZEPAM: NEGATIVE ng/mL (ref <50)
Hydrocodone: NEGATIVE ng/mL (ref <50)
Hydromorphone: 430 ng/mL — ABNORMAL HIGH (ref <50)
LORAZEPAM: NEGATIVE ng/mL (ref <50)
MARIJUANA METABOLITE: NEGATIVE ng/mL (ref <20)
MDMA: NEGATIVE ng/mL (ref <500)
Methamphetamine: NEGATIVE ng/mL (ref <250)
NORDIAZEPAM: NEGATIVE ng/mL (ref <50)
NORHYDROCODONE: NEGATIVE ng/mL (ref <50)
Norbuprenorphine: NEGATIVE ng/mL (ref <2)
Noroxycodone: >20000 ng/mL — ABNORMAL HIGH (ref <50)
OPIATES: POSITIVE ng/mL — AB (ref <100)
OXIDANT: NEGATIVE ug/mL (ref <200)
OXYCODONE: 5111 ng/mL — AB (ref <50)
OXYMORPHONE: 11263 ng/mL — AB (ref <50)
Oxazepam: NEGATIVE ng/mL (ref <50)
Oxycodone: POSITIVE ng/mL — AB (ref <100)
PH: 7.6 (ref 4.5–9.0)
Temazepam: NEGATIVE ng/mL (ref <50)

## 2018-02-20 ENCOUNTER — Telehealth: Payer: Self-pay | Admitting: Family Medicine

## 2018-02-20 ENCOUNTER — Other Ambulatory Visit: Payer: Self-pay | Admitting: *Deleted

## 2018-02-20 ENCOUNTER — Other Ambulatory Visit: Payer: Self-pay | Admitting: Family Medicine

## 2018-02-20 DIAGNOSIS — R51 Headache: Principal | ICD-10-CM

## 2018-02-20 DIAGNOSIS — R519 Headache, unspecified: Secondary | ICD-10-CM

## 2018-02-20 DIAGNOSIS — F988 Other specified behavioral and emotional disorders with onset usually occurring in childhood and adolescence: Secondary | ICD-10-CM

## 2018-02-20 MED ORDER — SUMATRIPTAN SUCCINATE 50 MG PO TABS: 50.0000 mg | ORAL_TABLET | ORAL | 1 refills | Status: DC | PRN

## 2018-02-20 MED ORDER — AMPHETAMINE-DEXTROAMPHET ER 30 MG PO CP24: 30.0000 mg | ORAL_CAPSULE | ORAL | 0 refills | Status: DC

## 2018-02-20 NOTE — Telephone Encounter (Signed)
done

## 2018-02-20 NOTE — Telephone Encounter (Signed)
Patient notified that rxs have been sent in. 

## 2018-02-20 NOTE — Telephone Encounter (Signed)
Patient requesting rx for adderall  Database ran 02/01/18 and is media for review  Last written: 10/13/17 Last ov: 02/07/18 Next ov: none Contract: 02/08/19 UDS: 08/09/18

## 2018-02-20 NOTE — Telephone Encounter (Signed)
Copied from CRM [id number]. Topic: Quick Communication - Rx Refill/Question >> Feb 20, 2018  9:53 AM Diana EvesHoyt, Maryann B wrote: Medication: SUMAtriptan (IMITREX) 50 MG tablet            amphetamine-dextroamphetamine (ADDERALL XR) 30 MG 24 hr capsule  Preferred Pharmacy (with phone number or street name): [facility] - [place], [place] - [address] Agent: Please be advised that RX refills may take up to 3 business days. We ask that you follow-up with your pharmacy.

## 2018-03-13 ENCOUNTER — Ambulatory Visit: Payer: Medicare Other | Admitting: Cardiology

## 2018-03-23 ENCOUNTER — Other Ambulatory Visit: Payer: Self-pay | Admitting: Family Medicine

## 2018-03-23 DIAGNOSIS — F988 Other specified behavioral and emotional disorders with onset usually occurring in childhood and adolescence: Secondary | ICD-10-CM

## 2018-03-23 NOTE — Telephone Encounter (Signed)
Copied from CRM [id number]. Topic: Quick Communication - See Telephone Encounter >> Mar 23, 2018 10:24 AM Terisa Starr wrote: CRM for notification. See Telephone encounter for: 03/23/18.  amphetamine-dextroamphetamine (ADDERALL XR) 30 MG 24 hr capsule [facility] - [place], [place] - [address]

## 2018-03-23 NOTE — Telephone Encounter (Signed)
Rx refill request: Adderall XR 30 mg    Last filled: 02/20/18 #30  LOV: 02/20/18  PCP: Zola Button  Pharmacy: verified

## 2018-03-27 MED ORDER — AMPHETAMINE-DEXTROAMPHET ER 30 MG PO CP24: 30.0000 mg | ORAL_CAPSULE | ORAL | 0 refills | Status: DC

## 2018-03-27 NOTE — Telephone Encounter (Signed)
Requesting:ADDERAL XR 30 Contract:01/22/17 UDS:Low risk Last OV:02/07/18 Next OV: - Last Refill: 02/20/18   Please advise

## 2018-04-07 ENCOUNTER — Ambulatory Visit: Payer: Medicare Other | Admitting: Family Medicine

## 2018-04-07 ENCOUNTER — Ambulatory Visit (HOSPITAL_BASED_OUTPATIENT_CLINIC_OR_DEPARTMENT_OTHER)
Admission: RE | Admit: 2018-04-07 | Discharge: 2018-04-07 | Disposition: A | Discharge status: Home / Self Care | Payer: Medicare Other | Source: Ambulatory Visit | Attending: Family Medicine | Admitting: Family Medicine

## 2018-04-07 ENCOUNTER — Encounter: Payer: Self-pay | Admitting: Family Medicine

## 2018-04-07 VITALS — BP 133/70 | HR 73 | Temp 98.1°F | Resp 16 | Ht 65.0 in | Wt 153.0 lb

## 2018-04-07 DIAGNOSIS — L03116 Cellulitis of left lower limb: Secondary | ICD-10-CM | POA: Diagnosis not present

## 2018-04-07 DIAGNOSIS — M79662 Pain in left lower leg: Secondary | ICD-10-CM | POA: Insufficient documentation

## 2018-04-07 DIAGNOSIS — R600 Localized edema: Secondary | ICD-10-CM | POA: Insufficient documentation

## 2018-04-07 DIAGNOSIS — L02416 Cutaneous abscess of left lower limb: Secondary | ICD-10-CM

## 2018-04-07 MED ORDER — DOXYCYCLINE HYCLATE 100 MG PO TABS: 100.0000 mg | ORAL_TABLET | Freq: Two times a day (BID) | ORAL | 0 refills | Status: DC

## 2018-04-07 NOTE — Patient Instructions (Signed)

## 2018-04-07 NOTE — Progress Notes (Signed)
Patient ID: [name], female   DOB: 1946-02-11, 72 y.o.   MRN: [id number]     Subjective:  I acted as a Neurosurgeon for Dr. Zola Button.  Apolonio Schneiders, CMA   Patient ID: [name], female    DOB: 08-Jan-1946, 72 y.o.   MRN: [id number]  Chief Complaint  Patient presents with  . bump on left leg    HPI  Patient is in today for bump on left lower leg.  It started while she was in [place] and after she was bit by an insect.  Spot became red and calf became more painful.   Pt had long plane ride from here to [place] and back as well.  No sob-- no worse than usual and no chest pain.    Patient Care Team: Zola Button, Grayling Congress, DO as PCP - General   Past Medical History:  Diagnosis Date  . ADD (attention deficit disorder) 05/24/2012  . ANEMIA, MILD 06/26/2008   Qualifier: Diagnosis of  By: Janit Bern    . Anxiety   . Arthritis    "joints, back" (10/02/2013)  . Chronic back pain    "all over my back" (10/02/2013)  . Congestive heart disease (HCC)    "I have the beginning signs" (10/02/2013)  . Crushing injury of arm, right 02/06/1989's   "it was crushed; wore cast from fingers to top of my shoulder" (11/25/2  . Crushing injury of left wrist 05/11/1989's  . DIZZINESS 12/31/2009   Qualifier: Diagnosis of  By: Oswald Hillock    . Fibromyalgia   . GERD 12/03/2008   Qualifier: Diagnosis of  By: Freddy Jaksch    . HYPERLIPIDEMIA 05/15/2007   Qualifier: Diagnosis of  By: Janit Bern ; pt denies this hx on 10/02/2013  . Hypertension   . Idiopathic scoliosis 04/19/2013  . Migraines    "once/month" (10/02/2013)  . Osteoporosis, unspecified 04/19/2013  . Other vitamin B12 deficiency anemia 10/30/2009   Qualifier: Diagnosis of  By: Floydene Flock    . PALPITATIONS, RECURRENT 12/12/2009   Qualifier: Diagnosis of  By: Janit Bern    . Pica 12/28/2007   Qualifier: Diagnosis of  By: Janit Bern    . Pneumonia 10/02/2013   "had onsets of it before; not full blown til now"  (10/02/2013)  . PSTPRC STATUS, BARIATRIC SURGERY 05/15/2007   Qualifier: Diagnosis of  By: Janit Bern    . Pulmonary nodules/lesions, multiple   . TREMOR, ESSENTIAL 12/12/2009   Qualifier: Diagnosis of  By: Janit Bern    . Unspecified Myalgia and Myositis 05/15/2007   Centricity Description: FIBROMYALGIA Qualifier: Diagnosis of  By: Janit Bern   Centricity Description: MYALGIA Qualifier: Diagnosis of  By: Janit Bern      Past Surgical History:  Procedure Laterality Date  . ABDOMINAL HYSTERECTOMY  1980's  . APPENDECTOMY  1980's   "w/either my hysterectomy or myomectomy" (10/02/2013)  . BACK SURGERY    . CLOSED REDUCTION HAND FRACTURE Right 1990's   "it was crushed; wore cast from fingers to top of my shoulder" (10/02/2013)  . GASTRIC BYPASS  ~ 2003  . KNEE ARTHROSCOPY Right 2002  . MYOMECTOMY  1980's  . REDUCTION MAMMAPLASTY Bilateral 2001  . REFRACTIVE SURGERY Bilateral 2004  . SPINAL CORD STIMULATOR IMPLANT    . TONSILLECTOMY AND ADENOIDECTOMY  1955  . TUBAL LIGATION  1980's    Family History  Adopted: Yes  Problem Relation Age of Onset  . COPD  Mother   . Emphysema Mother   . Heart attack Mother   . Other Mother        tobacco abuse    Social History   Socioeconomic History  . Marital status: Married    Spouse name: Not on file  . Number of children: 2  . Years of education: Not on file  . Highest education level: Not on file  Occupational History  . Occupation: Retired  Engineer, production  . Financial resource strain: Not on file  . Food insecurity:    Worry: Not on file    Inability: Not on file  . Transportation needs:    Medical: Not on file    Non-medical: Not on file  Tobacco Use  . Smoking status: Never Smoker  . Smokeless tobacco: Never Used  Substance and Sexual Activity  . Alcohol use: No  . Drug use: No  . Sexual activity: Never    Partners: Male  Lifestyle  . Physical activity:    Days per week: Not on file    Minutes per  session: Not on file  . Stress: Not on file  Relationships  . Social connections:    Talks on phone: Not on file    Gets together: Not on file    Attends religious service: Not on file    Active member of club or organization: Not on file    Attends meetings of clubs or organizations: Not on file    Relationship status: Not on file  . Intimate partner violence:    Fear of current or ex partner: Not on file    Emotionally abused: Not on file    Physically abused: Not on file    Forced sexual activity: Not on file  Other Topics Concern  . Not on file  Social History Narrative  . Not on file    Outpatient Medications Prior to Visit  Medication Sig Dispense Refill  . amLODipine (NORVASC) 5 MG tablet Take 1 tablet (5 mg total) by mouth daily. 90 tablet 3  . amphetamine-dextroamphetamine (ADDERALL XR) 30 MG 24 hr capsule Take 1 capsule (30 mg total) by mouth every morning. 30 capsule 0  . amphetamine-dextroamphetamine (ADDERALL XR) 30 MG 24 hr capsule Take 1 capsule (30 mg total) by mouth every morning. 30 capsule 0  . amphetamine-dextroamphetamine (ADDERALL XR) 30 MG 24 hr capsule Take 1 capsule (30 mg total) by mouth every morning. 30 capsule 0  . clonazePAM (KLONOPIN) 0.5 MG tablet Take 0.5 tablets (0.25 mg total) by mouth daily as needed for anxiety. 30 tablet 1  . morphine (MSIR) 15 MG tablet Take 15 mg by mouth every 12 (twelve) hours as needed for severe pain (for pain).    [name] omeprazole (PRILOSEC) 20 MG capsule Take 1 capsule (20 mg total) by mouth daily. 30 capsule 3  . Oxycodone HCl 20 MG TABS Take 1 tablet by mouth 4 (four) times daily as needed (pain).     . SUMAtriptan (IMITREX) 50 MG tablet Take 1 tablet (50 mg total) by mouth every 2 (two) hours as needed for migraine. May repeat in 2 hours if headache persists or recurs. 10 tablet 1  . venlafaxine XR (EFFEXOR-XR) 75 MG 24 hr capsule Take 3 capsules (225 mg total) by mouth daily. 270 capsule 3   No facility-administered  medications prior to visit.     No Known Allergies  Review of Systems  Constitutional: Negative for chills, fever and malaise/fatigue.  HENT: Negative for congestion  and hearing loss.   Eyes: Negative for discharge.  Respiratory: Negative for cough, sputum production and shortness of breath.   Cardiovascular: Negative for chest pain, palpitations and leg swelling.  Gastrointestinal: Negative for abdominal pain, blood in stool, constipation, diarrhea, heartburn, nausea and vomiting.  Genitourinary: Negative for dysuria, frequency, hematuria and urgency.  Musculoskeletal: Positive for falls and joint pain. Negative for back pain and myalgias.  Skin: Positive for itching and rash.  Neurological: Negative for dizziness, sensory change, loss of consciousness, weakness and headaches.  Endo/Heme/Allergies: Negative for environmental allergies. Does not bruise/bleed easily.  Psychiatric/Behavioral: Negative for depression and suicidal ideas. The patient is not nervous/anxious and does not have insomnia.        Objective:    Physical Exam  Constitutional: She is oriented to person, place, and time. She appears well-developed and well-nourished.  HENT:  Head: Normocephalic and atraumatic.  Eyes: Conjunctivae and EOM are normal.  Neck: Normal range of motion. Neck supple. No JVD present. Carotid bruit is not present. No thyromegaly present.  Cardiovascular: Normal rate, regular rhythm and normal heart sounds.  No murmur heard. Pulmonary/Chest: Effort normal and breath sounds normal. No respiratory distress. She has no wheezes. She has no rales. She exhibits no tenderness.  Musculoskeletal: She exhibits no edema.  Neurological: She is alert and oriented to person, place, and time.  Skin: There is erythema.  Psychiatric: She has a normal mood and affect.  Nursing note and vitals reviewed.     BP 133/70   Pulse 73   Temp 98.1 F (36.7 C) (Oral)   Resp 16   Ht  (1.651 m)   Wt 153  lb (69.4 kg)   SpO2 95%   BMI 25.46 kg/m  Wt Readings from Last 3 Encounters:  04/07/18 153 lb (69.4 kg)  02/07/18 160 lb 12.8 oz (72.9 kg)  08/23/17 187 lb 6.4 oz (85 kg)   BP Readings from Last 3 Encounters:  04/07/18 133/70  02/07/18 132/88  08/23/17 124/78     Immunization History  Administered Date(s) Administered  . Influenza, High Dose Seasonal PF 09/03/2016, 07/04/2017  . Influenza,inj,Quad PF,6+ Mos 10/15/2013, 11/28/2014  . Pneumococcal Conjugate-13 10/15/2013  . Pneumococcal Polysaccharide-23 12/05/2014  . Td 10/28/2009  . Zoster 10/28/2009    Health Maintenance  Topic Date Due  . COLONOSCOPY  02/27/2013  . MAMMOGRAM  04/09/2014  . INFLUENZA VACCINE  06/08/2018  . TETANUS/TDAP  10/29/2019  . DEXA SCAN  Completed  . Hepatitis C Screening  Completed  . PNA vac Low Risk Adult  Completed    Lab Results  Component Value Date   WBC 5.9 07/17/2017   HGB 13.9 07/17/2017   HCT 41.8 07/17/2017   PLT 234 07/17/2017   GLUCOSE 89 07/17/2017   CHOL 182 10/28/2009   TRIG 155.0 (H) 10/28/2009   HDL 61.30 10/28/2009   LDLDIRECT 129.1 07/17/2007   LDLCALC 90 10/28/2009   ALT 10 (L) 07/17/2017   AST 21 07/17/2017   NA 139 07/17/2017   K 3.5 07/17/2017   CL 102 07/17/2017   CREATININE 0.94 07/17/2017   BUN 18 07/17/2017   CO2 30 07/17/2017   TSH 1.26 04/22/2014   INR 1.14 03/07/2015   HGBA1C 5.3 04/22/2014    Lab Results  Component Value Date   TSH 1.26 04/22/2014   Lab Results  Component Value Date   WBC 5.9 07/17/2017   HGB 13.9 07/17/2017   HCT 41.8 07/17/2017   MCV 92.1 07/17/2017  PLT 234 07/17/2017   Lab Results  Component Value Date   NA 139 07/17/2017   K 3.5 07/17/2017   CO2 30 07/17/2017   GLUCOSE 89 07/17/2017   BUN 18 07/17/2017   CREATININE 0.94 07/17/2017   BILITOT 0.6 07/17/2017   ALKPHOS 102 07/17/2017   AST 21 07/17/2017   ALT 10 (L) 07/17/2017   PROT 7.5 07/17/2017   ALBUMIN 3.7 07/17/2017   CALCIUM 9.2 07/17/2017    ANIONGAP 7 07/17/2017   GFR 73.05 04/22/2014   Lab Results  Component Value Date   CHOL 182 10/28/2009   Lab Results  Component Value Date   HDL 61.30 10/28/2009   Lab Results  Component Value Date   LDLCALC 90 10/28/2009   Lab Results  Component Value Date   TRIG 155.0 (H) 10/28/2009   Lab Results  Component Value Date   CHOLHDL 3 10/28/2009   Lab Results  Component Value Date   HGBA1C 5.3 04/22/2014         Assessment & Plan:   Problem List Items Addressed This Visit    None    Visit Diagnoses    Pain of left calf    -  Primary   Relevant Orders   US Venous Img Lower Unilateral Left (Completed)   Cellulitis and abscess of left lower extremity       Relevant Medications   doxycycline (VIBRA-TABS) 100 MG tablet    check [place] to r/o dvt abx per orders Elevate leg rto 1 week or sooner prn  I am having [name] start on doxycycline. I am also having her maintain her Oxycodone HCl, morphine, clonazePAM, venlafaxine XR, omeprazole, amLODipine, SUMAtriptan, amphetamine-dextroamphetamine, amphetamine-dextroamphetamine, and amphetamine-dextroamphetamine.  Meds ordered this encounter  Medications  . doxycycline (VIBRA-TABS) 100 MG tablet    Sig: Take 1 tablet (100 mg total) by mouth 2 (two) times daily.    Dispense:  20 tablet    Refill:  0    CMA served as scribe during this visit. History, Physical and Plan performed by medical provider. Documentation and orders reviewed and attested to.  Donato Schultz, DO

## 2018-04-11 ENCOUNTER — Other Ambulatory Visit: Payer: Self-pay

## 2018-04-11 DIAGNOSIS — R519 Headache, unspecified: Secondary | ICD-10-CM

## 2018-04-11 DIAGNOSIS — R51 Headache: Principal | ICD-10-CM

## 2018-04-11 MED ORDER — SUMATRIPTAN SUCCINATE 50 MG PO TABS: 50.0000 mg | ORAL_TABLET | ORAL | 1 refills | Status: DC | PRN

## 2018-04-11 NOTE — Telephone Encounter (Signed)
Received notification from [facility] on 04/10/18 regarding patient needing refill on her Imitrex  50 mg. Called patient, still needs refill. Sent to Dr. Zola ButtonLowne-Chase for approval.

## 2018-04-12 ENCOUNTER — Other Ambulatory Visit: Payer: Self-pay | Admitting: *Deleted

## 2018-04-12 DIAGNOSIS — R519 Headache, unspecified: Secondary | ICD-10-CM

## 2018-04-12 DIAGNOSIS — R51 Headache: Principal | ICD-10-CM

## 2018-04-12 MED ORDER — SUMATRIPTAN SUCCINATE 50 MG PO TABS: 50.0000 mg | ORAL_TABLET | ORAL | 1 refills | Status: DC | PRN

## 2018-04-13 ENCOUNTER — Encounter: Payer: Self-pay | Admitting: Family Medicine

## 2018-04-14 ENCOUNTER — Ambulatory Visit: Payer: Medicare Other | Admitting: Family Medicine

## 2018-04-19 ENCOUNTER — Encounter: Payer: Self-pay | Admitting: Family Medicine

## 2018-05-03 ENCOUNTER — Ambulatory Visit: Payer: Medicare Other | Admitting: Cardiology

## 2018-05-03 ENCOUNTER — Telehealth: Payer: Self-pay | Admitting: Family Medicine

## 2018-05-03 NOTE — Telephone Encounter (Signed)
Copied from CRM [id number]. Topic: Quick Communication - Rx Refill/Question >> May 03, 2018  1:43 PM Alexander BergeronBarksdale, Harvey B wrote: Medication: amphetamine-dextroamphetamine (ADDERALL XR) 30 MG 24 hr capsule [[id number]][[id number]]   Has the patient contacted their pharmacy? Yes.   (Agent: If no, request that the patient contact the pharmacy for the refill.) (Agent: If yes, when and what did the pharmacy advise?)  Preferred Pharmacy (with phone number or street name): [facility]  Agent: Please be advised that RX refills may take up to 3 business days. We ask that you follow-up with your pharmacy.

## 2018-05-04 NOTE — Telephone Encounter (Signed)
Contacted [facility] and prescription was available for refill. Medication was last filled on Aprl 15th. Pharmacy states that medication will be filled for the pt.

## 2018-06-01 ENCOUNTER — Other Ambulatory Visit: Payer: Self-pay | Admitting: Family Medicine

## 2018-06-01 DIAGNOSIS — F988 Other specified behavioral and emotional disorders with onset usually occurring in childhood and adolescence: Secondary | ICD-10-CM

## 2018-06-01 NOTE — Telephone Encounter (Signed)
Copied from CRM [id number]. Topic: Quick Communication - Rx Refill/Question >> Jun 01, 2018 11:17 AM Alexander BergeronBarksdale, Harvey B wrote: Medication: amphetamine-dextroamphetamine (ADDERALL XR) 30 MG 24 hr capsule [[id number]][[id number]]   Has the patient contacted their pharmacy? Yes.   (Agent: If no, request that the patient contact the pharmacy for the refill.) (Agent: If yes, when and what did the pharmacy advise?)  Preferred Pharmacy (with phone number or street name): [facility]  Agent: Please be advised that RX refills may take up to 3 business days. We ask that you follow-up with your pharmacy.

## 2018-06-02 ENCOUNTER — Ambulatory Visit: Payer: Medicare Other | Admitting: Family Medicine

## 2018-06-02 ENCOUNTER — Encounter: Payer: Self-pay | Admitting: Family Medicine

## 2018-06-02 DIAGNOSIS — T148XXA Other injury of unspecified body region, initial encounter: Secondary | ICD-10-CM | POA: Diagnosis not present

## 2018-06-02 DIAGNOSIS — F988 Other specified behavioral and emotional disorders with onset usually occurring in childhood and adolescence: Secondary | ICD-10-CM | POA: Diagnosis not present

## 2018-06-02 MED ORDER — AMPHETAMINE-DEXTROAMPHET ER 20 MG PO CP24: ORAL_CAPSULE | ORAL | 0 refills | Status: DC

## 2018-06-02 NOTE — Telephone Encounter (Signed)
Requesting: adderall 30mg  24hr capsule Contract: 2018 UDS: 01/12/17 Last OV: 04/07/18 Next Ov: 06/02/18 Last refill: 03/27/18 #30, [facility] Database: Receiving oxycodone and morphine from Dr. Wendee CoppAllison Rose Fischer   Please advise.

## 2018-06-02 NOTE — Patient Instructions (Signed)
Living With Attention Deficit Hyperactivity Disorder If you have been diagnosed with attention deficit hyperactivity disorder (ADHD), you may be relieved that you now know why you have felt or behaved a certain way. Still, you may feel overwhelmed about the treatment ahead. You may also wonder how to get the support you need and how to deal with the condition day-to-day. With treatment and support, you can live with ADHD and manage your symptoms. How to manage lifestyle changes Managing stress Stress is your body's reaction to life changes and events, both good and bad. To cope with the stress of an ADHD diagnosis, it may help to:  Learn more about ADHD.  Exercise regularly. Even a short daily walk can lower stress levels.  Participate in training or education programs (including social skills training classes) that teach you to deal with symptoms.  Medicines Your health care provider may suggest certain medicines if he or she feels that they will help to improve your condition. Stimulant medicines are usually prescribed to treat ADHD, and therapy may also be prescribed. It is important to:  Avoid using alcohol and other substances that may prevent your medicines from working properly (mayinteract).  Talk with your pharmacist or health care provider about all the medicines that you take, their possible side effects, and what medicines are safe to take together.  Make it your goal to take part in all treatment decisions (shared decision-making). Ask about possible side effects of medicines that your health care provider recommends, and tell him or her how you feel about having those side effects. It is best if shared decision-making with your health care provider is part of your total treatment plan.  Relationships To strengthen your relationships with family members while treating your condition, consider taking part in family therapy. You might also attend self-help groups alone or with a  loved one. Be honest about how your symptoms affect your relationships. Make an effort to communicate respectfully instead of fighting, and find ways to show others that you care. Psychotherapy may be useful in helping you cope with how ADHD affects your relationships. How to recognize changes in your condition The following signs may mean that your treatment is working well and your condition is improving:  Consistently being on time for appointments.  Being more organized at home and work.  Other people noticing improvements in your behavior.  Achieving goals that you set for yourself.  Thinking more clearly.  The following signs may mean that your treatment is not working very well:  Feeling impatience or more confusion.  Missing, forgetting, or being late for appointments.  An increasing sense of disorganization and messiness.  More difficulty in reaching goals that you set for yourself.  Loved ones becoming angry or frustrated with you.  Where to find support Talking to others  Keep emotion out of important discussions and speak in a calm, logical way.  Listen closely and patiently to your loved ones. Try to understand their point of view, and try to avoid getting defensive.  Take responsibility for the consequences of your actions.  Ask that others do not take your behaviors personally.  Aim to solve problems as they come up, and express your feelings instead of bottling them up.  Talk openly about what you need from your loved ones and how they can support you.  Consider going to family therapy sessions or having your family meet with a specialist who deals with ADHD-related behavior problems. Finances Not all insurance plans cover   mental health care, so it is important to check with your insurance carrier. If paying for co-pays or counseling services is a problem, search for a local or county mental health care center. Public mental health care services may be  offered there at a low cost or no cost when you are not able to see a private health care provider. If you are taking medicine for ADHD, you may be able to get the generic form, which may be less expensive than brand-name medicine. Some makers of prescription medicines also offer help to patients who cannot afford the medicines that they need. Follow these instructions at home:  Take over-the-counter and prescription medicines only as told by your health care provider. Check with your health care provider before taking any new medicines.  Create structure and an organized atmosphere at home. For example: ? Make a list of tasks, then rank them from most important to least important. Work on one task at a time until your listed tasks are done. ? Make a daily schedule and follow it consistently every day. ? Use an appointment calendar, and check it 2 or 3 times a day to keep on track. Keep it with you when you leave the house. ? Create spaces where you keep certain things, and always put things back in their places after you use them.  Keep all follow-up visits as told by your health care provider. This is important. Questions to ask your health care provider:  What are the risks and benefits of taking medicines?  Would I benefit from therapy?  How often should I follow up with a health care provider? Contact a health care provider if:  You have side effects from your medicines, such as: ? Repeated muscle twitches, coughing, or speech outbursts. ? Sleep problems. ? Loss of appetite. ? Depression. ? New or worsening behavior problems. ? Dizziness. ? Unusually fast heartbeat. ? Stomach pains. ? Headaches. Get help right away if:  You have a severe reaction to a medicine.  Your behavior suddenly gets worse. Summary  With treatment and support, you can live with ADHD and manage your symptoms.  The medicines that are most often prescribed for ADHD are stimulants.  Consider taking  part in family therapy or self-help groups with family members or friends.  When you talk with friends and family about your ADHD, be patient and communicate openly.  Take over-the-counter and prescription medicines only as told by your health care provider. Check with your health care provider before taking any new medicines. This information is not intended to replace advice given to you by your health care provider. Make sure you discuss any questions you have with your health care provider. Document Released: 02/24/2017 Document Revised: 02/24/2017 Document Reviewed: 02/24/2017 Elsevier Interactive Patient Education  2018 [employer]vier Inc.  

## 2018-06-02 NOTE — Telephone Encounter (Signed)
Adderall  LRF 03/27/18  #30  0 refills  LOV 04/07/18  Dr. Renetta ChalkLowne  [facility]  [facility]

## 2018-06-02 NOTE — Progress Notes (Signed)
Patient ID: [name], female    DOB: 12/31/1945  Age: 72 y.o. MRN: [id number]    Subjective:  Subjective  HPI [name] presents for f/u bug bite  She finished the doxy and now has a hard lump in the spot where the bite   Review of Systems  Constitutional: Negative for activity change, appetite change, fatigue, fever and unexpected weight change.  HENT: Negative for congestion.   Respiratory: Negative for cough and shortness of breath.   Cardiovascular: Negative for chest pain, palpitations and leg swelling.  Gastrointestinal: Negative for abdominal pain, blood in stool and nausea.  Genitourinary: Negative for dysuria and frequency.  Skin: Negative for rash.  Allergic/Immunologic: Negative for environmental allergies.  Neurological: Negative for dizziness and headaches.  Psychiatric/Behavioral: Negative for behavioral problems and dysphoric mood. The patient is not nervous/anxious.     History Past Medical History:  Diagnosis Date  . ADD (attention deficit disorder) 05/24/2012  . ANEMIA, MILD 06/26/2008   Qualifier: Diagnosis of  By: Janit Bern    . Anxiety   . Arthritis    "joints, back" (10/02/2013)  . Chronic back pain    "all over my back" (10/02/2013)  . Congestive heart disease (HCC)    "I have the beginning signs" (10/02/2013)  . Crushing injury of arm, right 02/06/1989's   "it was crushed; wore cast from fingers to top of my shoulder" (11/25/2  . Crushing injury of left wrist 05/11/1989's  . DIZZINESS 12/31/2009   Qualifier: Diagnosis of  By: Oswald Hillock    . Fibromyalgia   . GERD 12/03/2008   Qualifier: Diagnosis of  By: Freddy Jaksch    . HYPERLIPIDEMIA 05/15/2007   Qualifier: Diagnosis of  By: Janit Bern ; pt denies this hx on 10/02/2013  . Hypertension   . Idiopathic scoliosis 04/19/2013  . Migraines    "once/month" (10/02/2013)  . Osteoporosis, unspecified 04/19/2013  . Other vitamin B12 deficiency anemia 10/30/2009   Qualifier:  Diagnosis of  By: Floydene Flock    . PALPITATIONS, RECURRENT 12/12/2009   Qualifier: Diagnosis of  By: Janit Bern    . Pica 12/28/2007   Qualifier: Diagnosis of  By: Janit Bern    . Pneumonia 10/02/2013   "had onsets of it before; not full blown til now" (10/02/2013)  . PSTPRC STATUS, BARIATRIC SURGERY 05/15/2007   Qualifier: Diagnosis of  By: Janit Bern    . Pulmonary nodules/lesions, multiple   . TREMOR, ESSENTIAL 12/12/2009   Qualifier: Diagnosis of  By: Janit Bern    . Unspecified Myalgia and Myositis 05/15/2007   Centricity Description: FIBROMYALGIA Qualifier: Diagnosis of  By: Janit Bern   Centricity Description: MYALGIA Qualifier: Diagnosis of  By: Janit Bern      She has a past surgical history that includes Gastric bypass (~ 2003); Abdominal hysterectomy (1980's); Knee arthroscopy (Right, 2002); Tonsillectomy and adenoidectomy (1955); Myomectomy (1980's); Appendectomy (1980's); Tubal ligation (1980's); Reduction mammaplasty (Bilateral, 2001); Refractive surgery (Bilateral, 2004); Closed reduction hand fracture (Right, 1990's); Back surgery; and Spinal cord stimulator implant.   Her family history includes COPD in her mother; Emphysema in her mother; Heart attack in her mother; Other in her mother. She was adopted.She reports that she has never smoked. She has never used smokeless tobacco. She reports that she does not drink alcohol or use drugs.  Current Outpatient Medications on File Prior to Visit  Medication Sig Dispense Refill  . amLODipine (NORVASC) 5 MG tablet  Take 1 tablet (5 mg total) by mouth daily. 90 tablet 3  . amphetamine-dextroamphetamine (ADDERALL XR) 30 MG 24 hr capsule Take 1 capsule (30 mg total) by mouth every morning. 30 capsule 0  . clonazePAM (KLONOPIN) 0.5 MG tablet Take 0.5 tablets (0.25 mg total) by mouth daily as needed for anxiety. 30 tablet 1  . morphine (MSIR) 15 MG tablet Take 15 mg by mouth every 12 (twelve) hours as needed  for severe pain (for pain).    [name] omeprazole (PRILOSEC) 20 MG capsule Take 1 capsule (20 mg total) by mouth daily. 30 capsule 3  . Oxycodone HCl 20 MG TABS Take 1 tablet by mouth 4 (four) times daily as needed (pain).     . SUMAtriptan (IMITREX) 50 MG tablet Take 1 tablet (50 mg total) by mouth every 2 (two) hours as needed for migraine. May repeat in 2 hours if headache persists or recurs. 10 tablet 1  . venlafaxine XR (EFFEXOR-XR) 75 MG 24 hr capsule Take 3 capsules (225 mg total) by mouth daily. 270 capsule 3   No current facility-administered medications on file prior to visit.      Objective:  Objective  Physical Exam  Constitutional: She is oriented to person, place, and time. She appears well-developed and well-nourished.  HENT:  Head: Normocephalic and atraumatic.  Eyes: Conjunctivae and EOM are normal.  Neck: Normal range of motion. Neck supple. No JVD present. Carotid bruit is not present. No thyromegaly present.  Cardiovascular: Normal rate, regular rhythm and normal heart sounds.  No murmur heard. Pulmonary/Chest: Effort normal and breath sounds normal. No respiratory distress. She has no wheezes. She has no rales. She exhibits no tenderness.  Musculoskeletal: She exhibits no edema.  Neurological: She is alert and oriented to person, place, and time.  Skin:     Psychiatric: She has a normal mood and affect.  Nursing note and vitals reviewed.  BP 123/77 (BP Location: Left Arm, Patient Position: Sitting, Cuff Size: Large)   Pulse 85   Temp 98.4 F (36.9 C) (Oral)   Ht 5\' 5"  (1.651 m)   Wt 146 lb 12.8 oz (66.6 kg)   SpO2 95%   BMI 24.43 kg/m  Wt Readings from Last 3 Encounters:  06/02/18 146 lb 12.8 oz (66.6 kg)  04/07/18 153 lb (69.4 kg)  02/07/18 160 lb 12.8 oz (72.9 kg)     Lab Results  Component Value Date   WBC 5.9 07/17/2017   HGB 13.9 07/17/2017   HCT 41.8 07/17/2017   PLT 234 07/17/2017   GLUCOSE 89 07/17/2017   CHOL 182 10/28/2009   TRIG 155.0 (H)  10/28/2009   HDL 61.30 10/28/2009   LDLDIRECT 129.1 07/17/2007   LDLCALC 90 10/28/2009   ALT 10 (L) 07/17/2017   AST 21 07/17/2017   NA 139 07/17/2017   K 3.5 07/17/2017   CL 102 07/17/2017   CREATININE 0.94 07/17/2017   BUN 18 07/17/2017   CO2 30 07/17/2017   TSH 1.26 04/22/2014   INR 1.14 03/07/2015   HGBA1C 5.3 04/22/2014    US Venous Img Lower Unilateral Left  Result Date: 04/07/2018 CLINICAL DATA:  Left calf pain, redness and swelling for 5 days after bug bite EXAM: LEFT LOWER EXTREMITY VENOUS DOPPLER ULTRASOUND TECHNIQUE: Gray-scale sonography with graded compression, as well as color Doppler and duplex ultrasound were performed to evaluate the lower extremity deep venous systems from the level of the common femoral vein and including the common femoral, femoral, profunda femoral, popliteal  and calf veins including the posterior tibial, peroneal and gastrocnemius veins when visible. The superficial great saphenous vein was also interrogated. Spectral Doppler was utilized to evaluate flow at rest and with distal augmentation maneuvers in the common femoral, femoral and popliteal veins. COMPARISON:  None. FINDINGS: Contralateral Common Femoral Vein: Respiratory phasicity is normal and symmetric with the symptomatic side. No evidence of thrombus. Normal compressibility. Common Femoral Vein: No evidence of thrombus. Normal compressibility, respiratory phasicity and response to augmentation. Saphenofemoral Junction: No evidence of thrombus. Normal compressibility and flow on color Doppler imaging. Profunda Femoral Vein: No evidence of thrombus. Normal compressibility and flow on color Doppler imaging. Femoral Vein: No evidence of thrombus. Normal compressibility, respiratory phasicity and response to augmentation. Popliteal Vein: No evidence of thrombus. Normal compressibility, respiratory phasicity and response to augmentation. Calf Veins: Limited assessment of the calf veins secondary to  peripheral edema. No gross occlusive DVT. Superficial Great Saphenous Vein: No evidence of thrombus. Normal compressibility. Venous Reflux:  None. Other Findings:  Calf subcutaneous edema noted. IMPRESSION: No significant left lower extremity DVT. Peripheral calf edema. Electronically Signed   By: Judie PetitM.  Shick M.D.   On: 04/07/2018 14:07     Assessment & Plan:  Plan  I have discontinued Berna C. Double's doxycycline. I am also having her start on amphetamine-dextroamphetamine. Additionally, I am having her maintain her Oxycodone HCl, morphine, clonazePAM, venlafaxine XR, omeprazole, amLODipine, amphetamine-dextroamphetamine, and SUMAtriptan.  Meds ordered this encounter  Medications  . amphetamine-dextroamphetamine (ADDERALL XR) 20 MG 24 hr capsule    Sig: 2 po qam    Dispense:  60 capsule    Refill:  0    Problem List Items Addressed This Visit      Unprioritized   ADD (attention deficit disorder)   Relevant Medications   amphetamine-dextroamphetamine (ADDERALL XR) 20 MG 24 hr capsule   Hematoma    Warm compresses If any changes rto          Follow-up: Return if symptoms worsen or fail to improve.  Donato SchultzYvonne R Lowne Chase, DO

## 2018-06-03 DIAGNOSIS — T148XXA Other injury of unspecified body region, initial encounter: Secondary | ICD-10-CM | POA: Insufficient documentation

## 2018-06-03 NOTE — Assessment & Plan Note (Signed)
Warm compresses If any changes rto

## 2018-06-06 ENCOUNTER — Other Ambulatory Visit: Payer: Self-pay

## 2018-06-06 DIAGNOSIS — F411 Generalized anxiety disorder: Secondary | ICD-10-CM

## 2018-06-06 MED ORDER — CLONAZEPAM 0.5 MG PO TABS: 0.2500 mg | ORAL_TABLET | Freq: Every day | ORAL | 1 refills | Status: DC | PRN

## 2018-06-06 NOTE — Telephone Encounter (Signed)
Last OV: 06/02/18 Last RF: 02/07/18  02/07/18:CSC (Non-Opioid) completed and signed UDS sample given low risk next screening 08/09/18

## 2018-07-04 ENCOUNTER — Telehealth: Payer: Self-pay | Admitting: Family Medicine

## 2018-07-04 NOTE — Telephone Encounter (Signed)
adderall XR 20 mg  refill Last Refill:06/02/18 # 60 Last OV: 06/02/18 PCP: Marya FossaY. Lowne Chase Pharmacy: [facility], [place]

## 2018-07-04 NOTE — Telephone Encounter (Signed)
Copied from CRM [id number]. Topic: Quick Communication - Rx Refill/Question >> Jul 04, 2018  4:02 PM Mickel BaasMcGee, Fiona Coto B, NT wrote: Medication: amphetamine-dextroamphetamine (ADDERALL XR) 20 MG 24 hr capsule   Has the patient contacted their pharmacy? Yes.   (Agent: If no, request that the patient contact the pharmacy for the refill.) (Agent: If yes, when and what did the pharmacy advise?)  Preferred Pharmacy (with phone number or street name): [facility] [place], [place] - [address]  Agent: Please be advised that RX refills may take up to 3 business days. We ask that you follow-up with your pharmacy.

## 2018-07-05 ENCOUNTER — Other Ambulatory Visit: Payer: Self-pay | Admitting: Family Medicine

## 2018-07-05 DIAGNOSIS — F988 Other specified behavioral and emotional disorders with onset usually occurring in childhood and adolescence: Secondary | ICD-10-CM

## 2018-07-05 MED ORDER — AMPHETAMINE-DEXTROAMPHET ER 20 MG PO CP24: ORAL_CAPSULE | ORAL | 0 refills | Status: DC

## 2018-07-05 NOTE — Telephone Encounter (Signed)
Database ran and is on your desk for review.  Last filled per database:  06/02/18 Last written: 06/02/18 Last ov: 06/02/18 Next ov: 07/07/18 Contract: 02/08/19 UDS: 08/09/18

## 2018-07-07 ENCOUNTER — Telehealth: Payer: Self-pay

## 2018-07-07 ENCOUNTER — Ambulatory Visit: Payer: Medicare Other | Admitting: Family Medicine

## 2018-07-07 NOTE — Telephone Encounter (Signed)
Copied from CRM [id number]. Topic: Quick Communication - Appointment Cancellation >> Jul 07, 2018  9:09 AM Leafy Roobinson, Norma J wrote: Patient called to cancel appointment scheduled for lowne Patient {HAS rescheduled their appointment. Pt has her grandchild today  department's PEC pool.

## 2018-07-13 ENCOUNTER — Ambulatory Visit: Payer: Medicare Other | Admitting: Family Medicine

## 2018-07-13 ENCOUNTER — Encounter: Payer: Self-pay | Admitting: Family Medicine

## 2018-07-13 VITALS — BP 111/73 | HR 96 | Temp 98.0°F | Resp 16 | Wt 140.4 lb

## 2018-07-13 DIAGNOSIS — L089 Local infection of the skin and subcutaneous tissue, unspecified: Secondary | ICD-10-CM

## 2018-07-13 MED ORDER — DOXYCYCLINE HYCLATE 100 MG PO TABS
100.0000 mg | ORAL_TABLET | Freq: Two times a day (BID) | ORAL | 0 refills | Status: DC
Start: 2018-07-13 — End: 2018-09-01

## 2018-07-13 NOTE — Patient Instructions (Signed)
MRSA Infection, Adult MRSA stands for methicillin-resistant Staphylococcus aureus. This type of infection is caused by Staphylococcus aureus bacteria that are no longer affected by the medicines used to kill them (drug resistant). Staphylococcus (staph) bacteria are normally found on the skin or in the nose of healthy people. In most cases, these bacteria do not cause infection. But if these resistant bacteria enter your body through a cut or sore, they can cause a serious infection on your skin or in other parts of your body. There is a slight chance that the staph on your skin or in your nose is MRSA. There are two types of MRSA infections:  Hospital-acquired MRSA is bacteria that you get in the hospital.  Community-acquired MRSA is bacteria that you get somewhere other than in a hospital.  What increases the risk? Hospital-acquired MRSA is more common. You could be at risk for this infection if you are in the hospital and you:  Have surgery or a procedure.  Have an IV access or a catheter tube placed in your body.  Have weak resistance to germs (weakened immune system).  Are elderly.  Are on kidney dialysis.  You could be at risk for community-acquired MRSA if you have a break in your skin and come into contact with MRSA. This may happen if you:  Play sports where there is skin-to-skin contact.  Live in a crowded setting, like a dormitory or a military barracks.  Share towels, razors, or sports equipment with other people.  What are the signs or symptoms? Symptoms of hospital-acquired MRSA depend on where MRSA has spread. Symptoms may include:  Wound infection.  Skin infection.  Rash.  Pneumonia.  Fever and chills.  Difficulty breathing.  Chest pain.  Community-acquired MRSA is most likely to start as a scratch or cut that becomes infected. Symptoms may include:  A pus-filled pimple.  A boil on your skin.  Pus draining from your skin.  A sore (abscess) under  your skin or somewhere in your body.  Fever with or without chills.  How is this diagnosed? The diagnosis of MRSA is made by taking a sample from an infected area and sending it to a lab for testing. A lab technician can grow (culture) MRSA and check it under a microscope. The cultured MRSA can be tested to see which type of antibiotic medicine will work to treat it. Newer tests can identify MRSA more quickly by testing bacteria samples for MRSA genes. Your health care provider can diagnose MRSA using samples from:  Cuts or wounds in infected areas.  Nasal swabs.  Saliva or cough specimens from deep in the lungs (sputum).  Urine.  Blood.  You may also have:  Imaging studies (such as X-ray or MRI) to check if the infection has spread to the lungs, bones, or joints.  A culture and sensitivity test of blood or fluids from inside the joints.  How is this treated? Treatment depends on how severe, deep, or extensive the infection is. Very bad infections may require a hospital stay.  Some skin infections, such as a small boil or sore (abscess), may be treated by draining pus from the site of the infection.  More extensive surgery to drain pus may be necessary for deeper or more widespread soft tissue infections.  You may then have to take antibiotic medicine given by mouth or through a vein. You may start antibiotic treatment right away or after testing can be done to see what antibiotic medicine should be   used.  Follow these instructions at home:  Take your antibiotics as directed by your health care provider. Take the medicine as prescribed until it is finished.  Avoid close contact with those around you as much as possible. Do not use towels, razors, toothbrushes, bedding, or other items that will be used by others.  Wash your hands frequently for 15 seconds with soap and water. Dry your hands with a clean or disposable towel.  When you are not able to wash your hands, use hand  sanitizer that is more than 60 percent alcohol.  Wash towels, sheets, or clothes in the washing machine with detergent and hot water. Dry them in a hot dryer.  Follow your health care provider's instructions for wound care. Wash your hands before and after changing your bandages.  Always shower after exercising.  Keep all cuts and scrapes clean and covered with a bandage.  Be sure to tell all your health care providers that you have MRSA so they are aware of your infection. Contact a health care provider if:  You have a cut, scrape, pimple, or boil that becomes red, swollen, or painful or has pus in it.  You have pus draining from your skin.  You have an abscess under your skin or somewhere in your body. Get help right away if:  You have symptoms of a skin infection with a fever or chills.  You have trouble breathing.  You have chest pain.  You have a skin wound and you become nauseous or start vomiting. This information is not intended to replace advice given to you by your health care provider. Make sure you discuss any questions you have with your health care provider. Document Released: 10/25/2005 Document Revised: 04/01/2016 Document Reviewed: 08/17/2013 Elsevier Interactive Patient Education  2017 [employer].  

## 2018-07-13 NOTE — Progress Notes (Signed)
Patient ID: [name], female   DOB: 10-23-1946, 72 y.o.   MRN: [id number]     Subjective:  I acted as a Neurosurgeon for Dr. Zola Button.  Apolonio Schneiders, CMA   Patient ID: [name], female    DOB: [birth year], 72 y.o.   MRN: [id number]  Chief Complaint  Patient presents with  . spots on arms, legs, chest, and  head    HPI  Patient is in today for spots on arms, legs, chest, and scalp.  Pt husband is with her and thinks she has a parasite.  The are requesting a ID referral  However the lesions did get better with doxy but it flared again when she finished it.   Pt states she saw worms coming out of her lesions No other complaints.       Patient Care Team: Zola Button, Grayling Congress, DO as PCP - General   Past Medical History:  Diagnosis Date  . ADD (attention deficit disorder) 05/24/2012  . ANEMIA, MILD 06/26/2008   Qualifier: Diagnosis of  By: Janit Bern    . Anxiety   . Arthritis    "joints, back" (10/02/2013)  . Chronic back pain    "all over my back" (10/02/2013)  . Congestive heart disease (HCC)    "I have the beginning signs" (10/02/2013)  . Crushing injury of arm, right 02/06/1989's   "it was crushed; wore cast from fingers to top of my shoulder" (11/25/2  . Crushing injury of left wrist 05/11/1989's  . DIZZINESS 12/31/2009   Qualifier: Diagnosis of  By: Oswald Hillock    . Fibromyalgia   . GERD 12/03/2008   Qualifier: Diagnosis of  By: Freddy Jaksch    . HYPERLIPIDEMIA 05/15/2007   Qualifier: Diagnosis of  By: Janit Bern ; pt denies this hx on 10/02/2013  . Hypertension   . Idiopathic scoliosis 04/19/2013  . Migraines    "once/month" (10/02/2013)  . Osteoporosis, unspecified 04/19/2013  . Other vitamin B12 deficiency anemia 10/30/2009   Qualifier: Diagnosis of  By: Floydene Flock    . PALPITATIONS, RECURRENT 12/12/2009   Qualifier: Diagnosis of  By: Janit Bern    . Pica 12/28/2007   Qualifier: Diagnosis of  By: Janit Bern    . Pneumonia 10/02/2013     "had onsets of it before; not full blown til now" (10/02/2013)  . PSTPRC STATUS, BARIATRIC SURGERY 05/15/2007   Qualifier: Diagnosis of  By: Janit Bern    . Pulmonary nodules/lesions, multiple   . TREMOR, ESSENTIAL 12/12/2009   Qualifier: Diagnosis of  By: Janit Bern    . Unspecified Myalgia and Myositis 05/15/2007   Centricity Description: FIBROMYALGIA Qualifier: Diagnosis of  By: Janit Bern   Centricity Description: MYALGIA Qualifier: Diagnosis of  By: Janit Bern      Past Surgical History:  Procedure Laterality Date  . ABDOMINAL HYSTERECTOMY  1980's  . APPENDECTOMY  1980's   "w/either my hysterectomy or myomectomy" (10/02/2013)  . BACK SURGERY    . CLOSED REDUCTION HAND FRACTURE Right 1990's   "it was crushed; wore cast from fingers to top of my shoulder" (10/02/2013)  . GASTRIC BYPASS  ~ 2003  . KNEE ARTHROSCOPY Right 2002  . MYOMECTOMY  1980's  . REDUCTION MAMMAPLASTY Bilateral 2001  . REFRACTIVE SURGERY Bilateral 2004  . SPINAL CORD STIMULATOR IMPLANT    . TONSILLECTOMY AND ADENOIDECTOMY  1955  . TUBAL LIGATION  1980's    Family History  Adopted:  Yes  Problem Relation Age of Onset  . COPD Mother   . Emphysema Mother   . Heart attack Mother   . Other Mother        tobacco abuse    Social History   Socioeconomic History  . Marital status: Married    Spouse name: Not on file  . Number of children: 2  . Years of education: Not on file  . Highest education level: Not on file  Occupational History  . Occupation: Retired  Engineer, production  . Financial resource strain: Not on file  . Food insecurity:    Worry: Not on file    Inability: Not on file  . Transportation needs:    Medical: Not on file    Non-medical: Not on file  Tobacco Use  . Smoking status: Never Smoker  . Smokeless tobacco: Never Used  Substance and Sexual Activity  . Alcohol use: No  . Drug use: No  . Sexual activity: Never    Partners: Male  Lifestyle  . Physical  activity:    Days per week: Not on file    Minutes per session: Not on file  . Stress: Not on file  Relationships  . Social connections:    Talks on phone: Not on file    Gets together: Not on file    Attends religious service: Not on file    Active member of club or organization: Not on file    Attends meetings of clubs or organizations: Not on file    Relationship status: Not on file  . Intimate partner violence:    Fear of current or ex partner: Not on file    Emotionally abused: Not on file    Physically abused: Not on file    Forced sexual activity: Not on file  Other Topics Concern  . Not on file  Social History Narrative  . Not on file    Outpatient Medications Prior to Visit  Medication Sig Dispense Refill  . amLODipine (NORVASC) 5 MG tablet Take 1 tablet (5 mg total) by mouth daily. 90 tablet 3  . amphetamine-dextroamphetamine (ADDERALL XR) 20 MG 24 hr capsule 2 po qam 60 capsule 0  . amphetamine-dextroamphetamine (ADDERALL XR) 30 MG 24 hr capsule Take 1 capsule (30 mg total) by mouth every morning. 30 capsule 0  . clonazePAM (KLONOPIN) 0.5 MG tablet Take 0.5 tablets (0.25 mg total) by mouth daily as needed for anxiety. 30 tablet 1  . morphine (MSIR) 15 MG tablet Take 15 mg by mouth every 12 (twelve) hours as needed for severe pain (for pain).    [name] omeprazole (PRILOSEC) 20 MG capsule Take 1 capsule (20 mg total) by mouth daily. 30 capsule 3  . Oxycodone HCl 20 MG TABS Take 1 tablet by mouth 4 (four) times daily as needed (pain).     . SUMAtriptan (IMITREX) 50 MG tablet Take 1 tablet (50 mg total) by mouth every 2 (two) hours as needed for migraine. May repeat in 2 hours if headache persists or recurs. 10 tablet 1  . venlafaxine XR (EFFEXOR-XR) 75 MG 24 hr capsule Take 3 capsules (225 mg total) by mouth daily. 270 capsule 3   No facility-administered medications prior to visit.     No Known Allergies  Review of Systems  Constitutional: Negative for fever and  malaise/fatigue.  HENT: Negative for congestion.   Eyes: Negative for blurred vision.  Respiratory: Negative for cough and shortness of breath.   Cardiovascular: Negative for  chest pain, palpitations and leg swelling.  Gastrointestinal: Negative for vomiting.  Musculoskeletal: Negative for back pain.  Skin: Negative for rash.  Neurological: Negative for loss of consciousness and headaches.       Objective:    Physical Exam  Constitutional: She is oriented to person, place, and time. She appears well-developed and well-nourished.  HENT:  Head: Normocephalic and atraumatic.  Eyes: Conjunctivae and EOM are normal.  Neck: Normal range of motion. Neck supple. No JVD present. Carotid bruit is not present. No thyromegaly present.  Cardiovascular: Normal rate, regular rhythm and normal heart sounds.  No murmur heard. Pulmonary/Chest: Effort normal and breath sounds normal. No respiratory distress. She has no wheezes. She has no rales. She exhibits no tenderness.  Musculoskeletal: She exhibits no edema.  Neurological: She is alert and oriented to person, place, and time.  Skin: Rash noted. There is erythema.     + sores on arms , legs and scalp See pictures   Psychiatric: She has a normal mood and affect.  Nursing note and vitals reviewed.         BP 111/73 (BP Location: Left Arm, Cuff Size: Large)   Pulse 96   Temp 98 F (36.7 C) (Oral)   Resp 16   Wt 140 lb 6.4 oz (63.7 kg)   SpO2 96%   BMI 23.36 kg/m  Wt Readings from Last 3 Encounters:  07/13/18 140 lb 6.4 oz (63.7 kg)  06/02/18 146 lb 12.8 oz (66.6 kg)  04/07/18 153 lb (69.4 kg)   BP Readings from Last 3 Encounters:  07/13/18 111/73  06/02/18 123/77  04/07/18 133/70     Immunization History  Administered Date(s) Administered  . Influenza, High Dose Seasonal PF 09/03/2016, 07/04/2017  . Influenza,inj,Quad PF,6+ Mos 10/15/2013, 11/28/2014  . Pneumococcal Conjugate-13 10/15/2013  . Pneumococcal  Polysaccharide-23 12/05/2014  . Td 10/28/2009  . Zoster 10/28/2009    Health Maintenance  Topic Date Due  . COLONOSCOPY  02/27/2013  . MAMMOGRAM  04/09/2014  . INFLUENZA VACCINE  06/08/2018  . TETANUS/TDAP  10/29/2019  . DEXA SCAN  Completed  . Hepatitis C Screening  Completed  . PNA vac Low Risk Adult  Completed    Lab Results  Component Value Date   WBC 5.9 07/17/2017   HGB 13.9 07/17/2017   HCT 41.8 07/17/2017   PLT 234 07/17/2017   GLUCOSE 89 07/17/2017   CHOL 182 10/28/2009   TRIG 155.0 (H) 10/28/2009   HDL 61.30 10/28/2009   LDLDIRECT 129.1 07/17/2007   LDLCALC 90 10/28/2009   ALT 10 (L) 07/17/2017   AST 21 07/17/2017   NA 139 07/17/2017   K 3.5 07/17/2017   CL 102 07/17/2017   CREATININE 0.94 07/17/2017   BUN 18 07/17/2017   CO2 30 07/17/2017   TSH 1.26 04/22/2014   INR 1.14 03/07/2015   HGBA1C 5.3 04/22/2014    Lab Results  Component Value Date   TSH 1.26 04/22/2014   Lab Results  Component Value Date   WBC 5.9 07/17/2017   HGB 13.9 07/17/2017   HCT 41.8 07/17/2017   MCV 92.1 07/17/2017   PLT 234 07/17/2017   Lab Results  Component Value Date   NA 139 07/17/2017   K 3.5 07/17/2017   CO2 30 07/17/2017   GLUCOSE 89 07/17/2017   BUN 18 07/17/2017   CREATININE 0.94 07/17/2017   BILITOT 0.6 07/17/2017   ALKPHOS 102 07/17/2017   AST 21 07/17/2017   ALT 10 (L) 07/17/2017  PROT 7.5 07/17/2017   ALBUMIN 3.7 07/17/2017   CALCIUM 9.2 07/17/2017   ANIONGAP 7 07/17/2017   GFR 73.05 04/22/2014   Lab Results  Component Value Date   CHOL 182 10/28/2009   Lab Results  Component Value Date   HDL 61.30 10/28/2009   Lab Results  Component Value Date   LDLCALC 90 10/28/2009   Lab Results  Component Value Date   TRIG 155.0 (H) 10/28/2009   Lab Results  Component Value Date   CHOLHDL 3 10/28/2009   Lab Results  Component Value Date   HGBA1C 5.3 04/22/2014         Assessment & Plan:   Problem List Items Addressed This Visit    None      Visit Diagnoses    Recurrent infection of skin    -  Primary   Relevant Medications   doxycycline (VIBRA-TABS) 100 MG tablet   Other Relevant Orders   Ambulatory referral to Infectious Disease   Wound culture      I am having [name] start on doxycycline. I am also having her maintain her Oxycodone HCl, morphine, venlafaxine XR, omeprazole, amLODipine, amphetamine-dextroamphetamine, SUMAtriptan, clonazePAM, and amphetamine-dextroamphetamine.  Meds ordered this encounter  Medications  . doxycycline (VIBRA-TABS) 100 MG tablet    Sig: Take 1 tablet (100 mg total) by mouth 2 (two) times daily.    Dispense:  20 tablet    Refill:  0    CMA served as scribe during this visit. History, Physical and Plan performed by medical provider. Documentation and orders reviewed and attested to.  Donato Schultz, DO

## 2018-07-16 LAB — WOUND CULTURE
MICRO NUMBER:: 91062242
SPECIMEN QUALITY:: ADEQUATE

## 2018-07-18 ENCOUNTER — Telehealth: Payer: Self-pay | Admitting: *Deleted

## 2018-07-18 NOTE — Telephone Encounter (Signed)
Patient left message in triage asking for her lab results. Patient has not been here, declined referral. RN left message letting her know this was not done at [facility], suggested her to contact her primary care physician for lab results. Andree Coss, RN

## 2018-07-26 ENCOUNTER — Telehealth: Payer: Self-pay | Admitting: Family Medicine

## 2018-07-26 NOTE — Telephone Encounter (Signed)
I'll refill x1 but if it does not go away she needs to come in

## 2018-07-26 NOTE — Telephone Encounter (Signed)
Please advise 

## 2018-07-26 NOTE — Telephone Encounter (Signed)
Copied from CRM [id number]. Topic: Quick Communication - See Telephone Encounter >> Jul 26, 2018  8:43 AM Luanna Coleawoud, Jessica L wrote: CRM for notification. See Telephone encounter for: 07/26/18.doxycycline (VIBRA-TABS) 100 MG tablet [[id number]][[id number]] pt would like a refill because she is not better. She would like to know if she needs to come in or can this just be refilled. Please advise

## 2018-07-27 ENCOUNTER — Other Ambulatory Visit: Payer: Self-pay | Admitting: Family Medicine

## 2018-07-27 DIAGNOSIS — R21 Rash and other nonspecific skin eruption: Secondary | ICD-10-CM

## 2018-07-27 NOTE — Telephone Encounter (Signed)
LMOM informing Pt that derm referral has been placed.

## 2018-07-27 NOTE — Telephone Encounter (Signed)
Author phoned pt. to notify her of doxy refill, and Dr. Ernst SpellLowne's request for pt. to be seen for OV if MRSA rash is not better. Pt. Stated rash has spread to face, around eyes, "everywhere", and she was under the impression that she needed to see a specialist. Routed to Dr. Laury AxonLowne to advise.

## 2018-07-27 NOTE — Telephone Encounter (Signed)
Yes--- referral to derm sent

## 2018-08-08 ENCOUNTER — Telehealth: Payer: Self-pay | Admitting: Family Medicine

## 2018-08-08 DIAGNOSIS — F411 Generalized anxiety disorder: Secondary | ICD-10-CM

## 2018-08-08 DIAGNOSIS — F988 Other specified behavioral and emotional disorders with onset usually occurring in childhood and adolescence: Secondary | ICD-10-CM

## 2018-08-08 NOTE — Telephone Encounter (Signed)
Copied from CRM [id number]. Topic: Quick Communication - Rx Refill/Question >> Aug 08, 2018  1:51 PM Raoul Pitch, Sade R wrote: Medication: clonazePAM (KLONOPIN) 0.5 MG tablet , amphetamine-dextroamphetamine (ADDERALL XR) 20 MG 24 hr capsule  Has the patient contacted their pharmacy?Yes  Preferred Pharmacy (with phone number or street name): [facility] - [place], [place] - [address] [phone] (Phone) [phone] (Fax)   Agent: Please be advised that RX refills may take up to 3 business days. We ask that you follow-up with your pharmacy.

## 2018-08-10 ENCOUNTER — Other Ambulatory Visit: Payer: Self-pay

## 2018-08-10 MED ORDER — AMLODIPINE BESYLATE 5 MG PO TABS: 5.0000 mg | ORAL_TABLET | Freq: Every day | ORAL | 0 refills | Status: DC

## 2018-08-10 NOTE — Telephone Encounter (Signed)
Pt calling to follow up on her medicine please give her a call back at [phone]

## 2018-08-10 NOTE — Telephone Encounter (Signed)
Database ran 07/05/18 and is media for review  Last written: clonazepam 06/06/18 adderall 07/05/18 Last ov: 07/13/18 Next ov: 10/16/18 Contract: 02/08/19 UDS: will get at 10/16/18 appt

## 2018-08-11 MED ORDER — CLONAZEPAM 0.5 MG PO TABS: 0.2500 mg | ORAL_TABLET | Freq: Every day | ORAL | 0 refills | Status: DC | PRN

## 2018-08-11 MED ORDER — AMPHETAMINE-DEXTROAMPHET ER 20 MG PO CP24: ORAL_CAPSULE | ORAL | 0 refills | Status: DC

## 2018-08-11 NOTE — Telephone Encounter (Signed)
Did you see this yesterday

## 2018-08-11 NOTE — Telephone Encounter (Signed)
No, but approved on behalf of PCP. TY.

## 2018-08-11 NOTE — Addendum Note (Signed)
Addended by: Radene Gunning on: 08/11/2018 03:03 PM   Modules accepted: Orders

## 2018-08-11 NOTE — Telephone Encounter (Signed)
Adderall was on there to.  Can you please send that one in to?

## 2018-08-21 ENCOUNTER — Inpatient Hospital Stay (HOSPITAL_COMMUNITY): Payer: Medicare Other

## 2018-08-21 ENCOUNTER — Emergency Department (HOSPITAL_COMMUNITY): Payer: Medicare Other

## 2018-08-21 ENCOUNTER — Inpatient Hospital Stay (HOSPITAL_COMMUNITY)
Admission: EM | Admit: 2018-08-21 | Discharge: 2018-09-01 | DRG: 871 | Disposition: A | Discharge status: Skilled Nursing Facility | Payer: Medicare Other | Attending: Internal Medicine | Admitting: Internal Medicine

## 2018-08-21 ENCOUNTER — Encounter (HOSPITAL_COMMUNITY): Payer: Self-pay

## 2018-08-21 ENCOUNTER — Other Ambulatory Visit: Payer: Self-pay

## 2018-08-21 DIAGNOSIS — Z8701 Personal history of pneumonia (recurrent): Secondary | ICD-10-CM

## 2018-08-21 DIAGNOSIS — M810 Age-related osteoporosis without current pathological fracture: Secondary | ICD-10-CM | POA: Diagnosis present

## 2018-08-21 DIAGNOSIS — F329 Major depressive disorder, single episode, unspecified: Secondary | ICD-10-CM | POA: Diagnosis present

## 2018-08-21 DIAGNOSIS — N179 Acute kidney failure, unspecified: Secondary | ICD-10-CM | POA: Diagnosis present

## 2018-08-21 DIAGNOSIS — I5032 Chronic diastolic (congestive) heart failure: Secondary | ICD-10-CM | POA: Diagnosis present

## 2018-08-21 DIAGNOSIS — T40601A Poisoning by unspecified narcotics, accidental (unintentional), initial encounter: Secondary | ICD-10-CM | POA: Diagnosis present

## 2018-08-21 DIAGNOSIS — N201 Calculus of ureter: Secondary | ICD-10-CM | POA: Diagnosis present

## 2018-08-21 DIAGNOSIS — E162 Hypoglycemia, unspecified: Secondary | ICD-10-CM | POA: Diagnosis present

## 2018-08-21 DIAGNOSIS — I110 Hypertensive heart disease with heart failure: Secondary | ICD-10-CM | POA: Diagnosis present

## 2018-08-21 DIAGNOSIS — A4151 Sepsis due to Escherichia coli [E. coli]: Secondary | ICD-10-CM | POA: Diagnosis present

## 2018-08-21 DIAGNOSIS — F909 Attention-deficit hyperactivity disorder, unspecified type: Secondary | ICD-10-CM | POA: Diagnosis present

## 2018-08-21 DIAGNOSIS — E860 Dehydration: Secondary | ICD-10-CM

## 2018-08-21 DIAGNOSIS — E43 Unspecified severe protein-calorie malnutrition: Secondary | ICD-10-CM | POA: Diagnosis present

## 2018-08-21 DIAGNOSIS — M8448XA Pathological fracture, other site, initial encounter for fracture: Secondary | ICD-10-CM | POA: Diagnosis present

## 2018-08-21 DIAGNOSIS — L8990 Pressure ulcer of unspecified site, unspecified stage: Secondary | ICD-10-CM

## 2018-08-21 DIAGNOSIS — M4850XA Collapsed vertebra, not elsewhere classified, site unspecified, initial encounter for fracture: Secondary | ICD-10-CM | POA: Diagnosis present

## 2018-08-21 DIAGNOSIS — D696 Thrombocytopenia, unspecified: Secondary | ICD-10-CM | POA: Diagnosis present

## 2018-08-21 DIAGNOSIS — Z1611 Resistance to penicillins: Secondary | ICD-10-CM | POA: Diagnosis present

## 2018-08-21 DIAGNOSIS — J690 Pneumonitis due to inhalation of food and vomit: Secondary | ICD-10-CM | POA: Diagnosis present

## 2018-08-21 DIAGNOSIS — G894 Chronic pain syndrome: Secondary | ICD-10-CM | POA: Diagnosis present

## 2018-08-21 DIAGNOSIS — I4891 Unspecified atrial fibrillation: Secondary | ICD-10-CM | POA: Diagnosis present

## 2018-08-21 DIAGNOSIS — R6521 Severe sepsis with septic shock: Secondary | ICD-10-CM | POA: Diagnosis present

## 2018-08-21 DIAGNOSIS — Z9884 Bariatric surgery status: Secondary | ICD-10-CM | POA: Diagnosis not present

## 2018-08-21 DIAGNOSIS — E8809 Other disorders of plasma-protein metabolism, not elsewhere classified: Secondary | ICD-10-CM | POA: Diagnosis present

## 2018-08-21 DIAGNOSIS — F419 Anxiety disorder, unspecified: Secondary | ICD-10-CM | POA: Diagnosis present

## 2018-08-21 DIAGNOSIS — J189 Pneumonia, unspecified organism: Secondary | ICD-10-CM | POA: Diagnosis present

## 2018-08-21 DIAGNOSIS — I7300 Raynaud's syndrome without gangrene: Secondary | ICD-10-CM | POA: Diagnosis present

## 2018-08-21 DIAGNOSIS — E876 Hypokalemia: Secondary | ICD-10-CM | POA: Diagnosis present

## 2018-08-21 DIAGNOSIS — Z9181 History of falling: Secondary | ICD-10-CM | POA: Diagnosis not present

## 2018-08-21 DIAGNOSIS — J9601 Acute respiratory failure with hypoxia: Secondary | ICD-10-CM | POA: Insufficient documentation

## 2018-08-21 DIAGNOSIS — R6520 Severe sepsis without septic shock: Secondary | ICD-10-CM | POA: Diagnosis not present

## 2018-08-21 DIAGNOSIS — N136 Pyonephrosis: Secondary | ICD-10-CM | POA: Diagnosis present

## 2018-08-21 DIAGNOSIS — N1330 Unspecified hydronephrosis: Secondary | ICD-10-CM

## 2018-08-21 DIAGNOSIS — Z8249 Family history of ischemic heart disease and other diseases of the circulatory system: Secondary | ICD-10-CM

## 2018-08-21 DIAGNOSIS — M4120 Other idiopathic scoliosis, site unspecified: Secondary | ICD-10-CM | POA: Diagnosis present

## 2018-08-21 DIAGNOSIS — E785 Hyperlipidemia, unspecified: Secondary | ICD-10-CM | POA: Diagnosis present

## 2018-08-21 DIAGNOSIS — Z79899 Other long term (current) drug therapy: Secondary | ICD-10-CM

## 2018-08-21 DIAGNOSIS — F1123 Opioid dependence with withdrawal: Secondary | ICD-10-CM | POA: Diagnosis present

## 2018-08-21 DIAGNOSIS — R1310 Dysphagia, unspecified: Secondary | ICD-10-CM

## 2018-08-21 DIAGNOSIS — K219 Gastro-esophageal reflux disease without esophagitis: Secondary | ICD-10-CM | POA: Diagnosis present

## 2018-08-21 DIAGNOSIS — Z23 Encounter for immunization: Secondary | ICD-10-CM

## 2018-08-21 DIAGNOSIS — D649 Anemia, unspecified: Secondary | ICD-10-CM | POA: Diagnosis present

## 2018-08-21 DIAGNOSIS — G250 Essential tremor: Secondary | ICD-10-CM | POA: Diagnosis present

## 2018-08-21 DIAGNOSIS — F411 Generalized anxiety disorder: Secondary | ICD-10-CM

## 2018-08-21 DIAGNOSIS — R52 Pain, unspecified: Secondary | ICD-10-CM

## 2018-08-21 DIAGNOSIS — L89156 Pressure-induced deep tissue damage of sacral region: Secondary | ICD-10-CM | POA: Diagnosis present

## 2018-08-21 DIAGNOSIS — R7881 Bacteremia: Secondary | ICD-10-CM | POA: Diagnosis not present

## 2018-08-21 DIAGNOSIS — D61818 Other pancytopenia: Secondary | ICD-10-CM | POA: Diagnosis present

## 2018-08-21 DIAGNOSIS — R4182 Altered mental status, unspecified: Secondary | ICD-10-CM | POA: Diagnosis present

## 2018-08-21 DIAGNOSIS — I10 Essential (primary) hypertension: Secondary | ICD-10-CM | POA: Diagnosis present

## 2018-08-21 DIAGNOSIS — G9340 Encephalopathy, unspecified: Secondary | ICD-10-CM | POA: Diagnosis not present

## 2018-08-21 DIAGNOSIS — A419 Sepsis, unspecified organism: Secondary | ICD-10-CM | POA: Diagnosis not present

## 2018-08-21 DIAGNOSIS — Z9682 Presence of neurostimulator: Secondary | ICD-10-CM

## 2018-08-21 DIAGNOSIS — G92 Toxic encephalopathy: Secondary | ICD-10-CM | POA: Diagnosis present

## 2018-08-21 DIAGNOSIS — Z791 Long term (current) use of non-steroidal anti-inflammatories (NSAID): Secondary | ICD-10-CM

## 2018-08-21 DIAGNOSIS — J181 Lobar pneumonia, unspecified organism: Secondary | ICD-10-CM | POA: Diagnosis not present

## 2018-08-21 DIAGNOSIS — R4189 Other symptoms and signs involving cognitive functions and awareness: Secondary | ICD-10-CM | POA: Insufficient documentation

## 2018-08-21 DIAGNOSIS — Z6823 Body mass index (BMI) 23.0-23.9, adult: Secondary | ICD-10-CM

## 2018-08-21 DIAGNOSIS — G9341 Metabolic encephalopathy: Secondary | ICD-10-CM | POA: Diagnosis not present

## 2018-08-21 DIAGNOSIS — Z90710 Acquired absence of both cervix and uterus: Secondary | ICD-10-CM

## 2018-08-21 DIAGNOSIS — R0682 Tachypnea, not elsewhere classified: Secondary | ICD-10-CM

## 2018-08-21 DIAGNOSIS — I519 Heart disease, unspecified: Secondary | ICD-10-CM | POA: Diagnosis present

## 2018-08-21 DIAGNOSIS — D519 Vitamin B12 deficiency anemia, unspecified: Secondary | ICD-10-CM | POA: Diagnosis present

## 2018-08-21 DIAGNOSIS — Z87442 Personal history of urinary calculi: Secondary | ICD-10-CM

## 2018-08-21 DIAGNOSIS — Z825 Family history of asthma and other chronic lower respiratory diseases: Secondary | ICD-10-CM

## 2018-08-21 DIAGNOSIS — T82594A Other mechanical complication of infusion catheter, initial encounter: Secondary | ICD-10-CM

## 2018-08-21 DIAGNOSIS — J9600 Acute respiratory failure, unspecified whether with hypoxia or hypercapnia: Secondary | ICD-10-CM

## 2018-08-21 DIAGNOSIS — Z8614 Personal history of Methicillin resistant Staphylococcus aureus infection: Secondary | ICD-10-CM

## 2018-08-21 DIAGNOSIS — M797 Fibromyalgia: Secondary | ICD-10-CM | POA: Diagnosis present

## 2018-08-21 HISTORY — DX: Personal history of pneumonia (recurrent): Z87.01

## 2018-08-21 LAB — CBC WITH DIFFERENTIAL/PLATELET
ABS IMMATURE GRANULOCYTES: 0.05 10*3/uL (ref 0.00–0.07)
BASOS ABS: 0 10*3/uL (ref 0.0–0.1)
Basophils Relative: 0 %
Eosinophils Absolute: 0 10*3/uL (ref 0.0–0.5)
Eosinophils Relative: 0 %
HCT: 32 % — ABNORMAL LOW (ref 36.0–46.0)
HEMOGLOBIN: 10.1 g/dL — AB (ref 12.0–15.0)
Immature Granulocytes: 0 %
LYMPHS ABS: 0.4 10*3/uL — AB (ref 0.7–4.0)
Lymphocytes Relative: 3 %
MCH: 29.7 pg (ref 26.0–34.0)
MCHC: 31.6 g/dL (ref 30.0–36.0)
MCV: 94.1 fL (ref 80.0–100.0)
Monocytes Absolute: 0.9 10*3/uL (ref 0.1–1.0)
Monocytes Relative: 8 %
NEUTROS ABS: 10 10*3/uL — AB (ref 1.7–7.7)
NRBC: 0 % (ref 0.0–0.2)
Neutrophils Relative %: 89 %
PLATELETS: 50 10*3/uL — AB (ref 150–400)
RBC: 3.4 MIL/uL — AB (ref 3.87–5.11)
RDW: 12.7 % (ref 11.5–15.5)
WBC: 11.3 10*3/uL — AB (ref 4.0–10.5)

## 2018-08-21 LAB — URINALYSIS, ROUTINE W REFLEX MICROSCOPIC
Bilirubin Urine: NEGATIVE
GLUCOSE, UA: NEGATIVE mg/dL
Ketones, ur: NEGATIVE mg/dL
Nitrite: NEGATIVE
PH: 6 (ref 5.0–8.0)
Protein, ur: 100 mg/dL — AB
SPECIFIC GRAVITY, URINE: 1.015 (ref 1.005–1.030)

## 2018-08-21 LAB — PROCALCITONIN: Procalcitonin: 53.79 ng/mL

## 2018-08-21 LAB — GLUCOSE, CAPILLARY
GLUCOSE-CAPILLARY: 103 mg/dL — AB (ref 70–99)
GLUCOSE-CAPILLARY: 44 mg/dL — AB (ref 70–99)

## 2018-08-21 LAB — COMPREHENSIVE METABOLIC PANEL
ALT: 16 U/L (ref 0–44)
ANION GAP: 11 (ref 5–15)
AST: 51 U/L — AB (ref 15–41)
Albumin: 2 g/dL — ABNORMAL LOW (ref 3.5–5.0)
Alkaline Phosphatase: 58 U/L (ref 38–126)
BUN: 53 mg/dL — ABNORMAL HIGH (ref 8–23)
CHLORIDE: 102 mmol/L (ref 98–111)
CO2: 21 mmol/L — AB (ref 22–32)
Calcium: 7.1 mg/dL — ABNORMAL LOW (ref 8.9–10.3)
Creatinine, Ser: 2.92 mg/dL — ABNORMAL HIGH (ref 0.44–1.00)
GFR calc Af Amer: 18 mL/min — ABNORMAL LOW (ref >60)
GFR calc non Af Amer: 15 mL/min — ABNORMAL LOW (ref >60)
GLUCOSE: 93 mg/dL (ref 70–99)
POTASSIUM: 3.3 mmol/L — AB (ref 3.5–5.1)
SODIUM: 134 mmol/L — AB (ref 135–145)
TOTAL PROTEIN: 4.1 g/dL — AB (ref 6.5–8.1)
Total Bilirubin: 0.8 mg/dL (ref 0.3–1.2)

## 2018-08-21 LAB — BASIC METABOLIC PANEL
ANION GAP: 10 (ref 5–15)
BUN: 49 mg/dL — ABNORMAL HIGH (ref 8–23)
CALCIUM: 7.1 mg/dL — AB (ref 8.9–10.3)
CO2: 20 mmol/L — ABNORMAL LOW (ref 22–32)
CREATININE: 2.49 mg/dL — AB (ref 0.44–1.00)
Chloride: 106 mmol/L (ref 98–111)
GFR calc non Af Amer: 18 mL/min — ABNORMAL LOW (ref >60)
GFR, EST AFRICAN AMERICAN: 21 mL/min — AB (ref >60)
Glucose, Bld: 128 mg/dL — ABNORMAL HIGH (ref 70–99)
Potassium: 3.4 mmol/L — ABNORMAL LOW (ref 3.5–5.1)
SODIUM: 136 mmol/L (ref 135–145)

## 2018-08-21 LAB — TYPE AND SCREEN
ABO/RH(D): A POS
Antibody Screen: NEGATIVE

## 2018-08-21 LAB — I-STAT CG4 LACTIC ACID, ED
LACTIC ACID, VENOUS: 3.09 mmol/L — AB (ref 0.5–1.9)
LACTIC ACID, VENOUS: 2.35 mmol/L — AB (ref 0.5–1.9)

## 2018-08-21 LAB — LACTIC ACID, PLASMA
LACTIC ACID, VENOUS: 3.5 mmol/L — AB (ref 0.5–1.9)
Lactic Acid, Venous: 2 mmol/L (ref 0.5–1.9)

## 2018-08-21 LAB — RAPID URINE DRUG SCREEN, HOSP PERFORMED
AMPHETAMINES: POSITIVE — AB
BENZODIAZEPINES: NOT DETECTED
Barbiturates: NOT DETECTED
Cocaine: NOT DETECTED
Opiates: POSITIVE — AB
TETRAHYDROCANNABINOL: NOT DETECTED

## 2018-08-21 LAB — URINALYSIS, MICROSCOPIC (REFLEX)

## 2018-08-21 LAB — PROTIME-INR
INR: 1.87
PROTHROMBIN TIME: 21.4 s — AB (ref 11.4–15.2)

## 2018-08-21 LAB — ACETAMINOPHEN LEVEL: Acetaminophen (Tylenol), Serum: 13 ug/mL (ref 10–30)

## 2018-08-21 LAB — POC OCCULT BLOOD, ED: Fecal Occult Bld: NEGATIVE

## 2018-08-21 LAB — MAGNESIUM: Magnesium: 1.6 mg/dL — ABNORMAL LOW (ref 1.7–2.4)

## 2018-08-21 LAB — TROPONIN I: Troponin I: 0.05 ng/mL (ref <0.03)

## 2018-08-21 LAB — ETHANOL

## 2018-08-21 LAB — SALICYLATE LEVEL: Salicylate Lvl: <7 mg/dL (ref 2.8–30.0)

## 2018-08-21 LAB — MRSA PCR SCREENING: MRSA BY PCR: POSITIVE — AB

## 2018-08-21 LAB — ABO/RH: ABO/RH(D): A POS

## 2018-08-21 MED ORDER — LACTATED RINGERS IV SOLN
INTRAVENOUS | Status: DC
  Administered 2018-08-21: 18:00:00 via INTRAVENOUS

## 2018-08-21 MED ORDER — SODIUM CHLORIDE 0.9 % IV BOLUS (SEPSIS)
1000.0000 mL | Freq: Once | INTRAVENOUS | Status: AC
  Administered 2018-08-21: 1000 mL via INTRAVENOUS

## 2018-08-21 MED ORDER — POTASSIUM CHLORIDE 10 MEQ/100ML IV SOLN
10.0000 meq | Freq: Once | INTRAVENOUS | Status: AC
  Administered 2018-08-21: 10 meq via INTRAVENOUS
  Filled 2018-08-21: qty 100

## 2018-08-21 MED ORDER — SODIUM CHLORIDE 0.9 % IV SOLN
1.0000 g | Freq: Once | INTRAVENOUS | Status: AC
  Administered 2018-08-21: 1 g via INTRAVENOUS
  Filled 2018-08-21: qty 10

## 2018-08-21 MED ORDER — PHENYLEPHRINE HCL-NACL 10-0.9 MG/250ML-% IV SOLN
0.0000 ug/min | INTRAVENOUS | Status: DC
  Administered 2018-08-22: 30 ug/min via INTRAVENOUS
  Administered 2018-08-22: 20 ug/min via INTRAVENOUS
  Administered 2018-08-23: 30 ug/min via INTRAVENOUS
  Filled 2018-08-21 (×6): qty 250

## 2018-08-21 MED ORDER — SODIUM CHLORIDE 0.9 % IV SOLN
500.0000 mg | Freq: Once | INTRAVENOUS | Status: AC
  Administered 2018-08-21: 500 mg via INTRAVENOUS
  Filled 2018-08-21: qty 500

## 2018-08-21 MED ORDER — PIPERACILLIN-TAZOBACTAM 3.375 G IVPB 30 MIN
3.3750 g | Freq: Once | INTRAVENOUS | Status: AC
  Administered 2018-08-21: 3.375 g via INTRAVENOUS
  Filled 2018-08-21: qty 50

## 2018-08-21 MED ORDER — DEXTROSE-NACL 5-0.9 % IV SOLN
INTRAVENOUS | Status: DC
  Administered 2018-08-22: 11:00:00 via INTRAVENOUS
  Administered 2018-08-22: 1000 mL via INTRAVENOUS
  Administered 2018-08-22 – 2018-08-23 (×2): via INTRAVENOUS

## 2018-08-21 MED ORDER — ORAL CARE MOUTH RINSE
15.0000 mL | Freq: Two times a day (BID) | OROMUCOSAL | Status: DC
  Administered 2018-08-21 – 2018-08-24 (×5): 15 mL via OROMUCOSAL

## 2018-08-21 MED ORDER — HEPARIN SODIUM (PORCINE) 5000 UNIT/ML IJ SOLN
5000.0000 [IU] | Freq: Three times a day (TID) | INTRAMUSCULAR | Status: DC
  Administered 2018-08-22 – 2018-08-23 (×2): 5000 [IU] via SUBCUTANEOUS
  Filled 2018-08-21 (×2): qty 1

## 2018-08-21 MED ORDER — PIPERACILLIN-TAZOBACTAM IN DEX 2-0.25 GM/50ML IV SOLN
2.2500 g | Freq: Four times a day (QID) | INTRAVENOUS | Status: DC
  Administered 2018-08-22 (×3): 2.25 g via INTRAVENOUS
  Filled 2018-08-21 (×4): qty 50

## 2018-08-21 MED ORDER — SODIUM CHLORIDE 0.9 % IV SOLN
INTRAVENOUS | Status: DC | PRN
  Administered 2018-08-21 – 2018-08-25 (×2): 250 mL via INTRAVENOUS

## 2018-08-21 MED ORDER — SODIUM CHLORIDE 0.9 % IV BOLUS
1000.0000 mL | Freq: Once | INTRAVENOUS | Status: AC
  Administered 2018-08-21: 1000 mL via INTRAVENOUS

## 2018-08-21 MED ORDER — SODIUM CHLORIDE 0.9% IV SOLUTION
Freq: Once | INTRAVENOUS | Status: AC
  Administered 2018-08-21: 21:00:00 via INTRAVENOUS

## 2018-08-21 MED ORDER — POTASSIUM CHLORIDE 10 MEQ/100ML IV SOLN
10.0000 meq | INTRAVENOUS | Status: AC
  Administered 2018-08-22 (×3): 10 meq via INTRAVENOUS
  Filled 2018-08-21 (×3): qty 100

## 2018-08-21 MED ORDER — DEXTROSE 50 % IV SOLN
INTRAVENOUS | Status: AC
  Administered 2018-08-22: 50 mL
  Filled 2018-08-21: qty 50

## 2018-08-21 MED ORDER — FAMOTIDINE IN NACL 20-0.9 MG/50ML-% IV SOLN
20.0000 mg | INTRAVENOUS | Status: DC
  Administered 2018-08-21 – 2018-08-26 (×6): 20 mg via INTRAVENOUS
  Filled 2018-08-21 (×7): qty 50

## 2018-08-21 MED ORDER — NALOXONE HCL 2 MG/2ML IJ SOSY
1.0000 mg | PREFILLED_SYRINGE | Freq: Once | INTRAMUSCULAR | Status: AC
  Administered 2018-08-21: 1 mg via INTRAVENOUS
  Filled 2018-08-21: qty 2

## 2018-08-21 MED ORDER — CHLORHEXIDINE GLUCONATE CLOTH 2 % EX PADS
6.0000 | MEDICATED_PAD | Freq: Every day | CUTANEOUS | Status: AC
  Administered 2018-08-22 – 2018-08-25 (×5): 6 via TOPICAL

## 2018-08-21 MED ORDER — DEXTROSE 50 % IV SOLN
1.0000 | Freq: Once | INTRAVENOUS | Status: AC
  Administered 2018-08-21: 50 mL via INTRAVENOUS

## 2018-08-21 MED ORDER — DEXTROSE 50 % IV SOLN
INTRAVENOUS | Status: AC
  Filled 2018-08-21: qty 50

## 2018-08-21 MED ORDER — MUPIROCIN 2 % EX OINT
1.0000 "application " | TOPICAL_OINTMENT | Freq: Two times a day (BID) | CUTANEOUS | Status: AC
  Administered 2018-08-22 – 2018-08-26 (×10): 1 via NASAL
  Filled 2018-08-21: qty 22

## 2018-08-21 MED ORDER — POTASSIUM CHLORIDE 10 MEQ/100ML IV SOLN: 10.0000 meq | INTRAVENOUS | Status: DC

## 2018-08-21 MED ORDER — ASPIRIN 300 MG RE SUPP
150.0000 mg | Freq: Every day | RECTAL | Status: DC
  Administered 2018-08-21: 150 mg via RECTAL
  Filled 2018-08-21: qty 1

## 2018-08-21 MED ORDER — MAGNESIUM SULFATE 2 GM/50ML IV SOLN
2.0000 g | Freq: Once | INTRAVENOUS | Status: AC
  Administered 2018-08-22: 2 g via INTRAVENOUS
  Filled 2018-08-21: qty 50

## 2018-08-21 NOTE — ED Notes (Signed)
Patient is much more alert husband thinks she may have taken additional medication last pm for pain

## 2018-08-21 NOTE — Progress Notes (Signed)
CRITICAL VALUE ALERT  Critical Value:  Lactic Acid 2.0  Date & Time Notifieded:  08/21/2018 2213  Provider Notified: Pola Corn, Dr. Arsenio Loader  Orders Received/Actions taken: Lactic trending down, no new orders at this time. Will continue to monitor closely. Caswell Corwin, RN 08/21/18 10:35 PM

## 2018-08-21 NOTE — ED Notes (Signed)
CCM MD at bedside.  

## 2018-08-21 NOTE — Progress Notes (Signed)
Pt is currently receiving FFP @ 117mL/hr. Aside from pt having 3 episodes of SVT, pts vital signs have remained stable. Dr. Arsenio Loader was notified, and advised to slow down her FFP rate. Will continue to monitor closely. Caswell Corwin, RN 08/21/18 10:39 PM

## 2018-08-21 NOTE — ED Provider Notes (Signed)
[facility] EMERGENCY DEPARTMENT Provider Note   CSN: [id number] Arrival date & time: 08/21/18  1035     History   Chief Complaint Chief Complaint  Patient presents with  . Hypotension  . Altered Mental Status    HPI [name] is a 72 y.o. female.  The history is provided by the spouse. The history is limited by the condition of the patient.    Level 5 caveat due to acuity of condition.  [name] is a 72 y.o. female, with a history of GERD, fibromyalgia, hyperlipidemia, HTN, presenting to the ED from home with decreased responsiveness noted this morning.  Responsive only to pain with EMS.  EMS also noted hypotension with blood pressure of 76 systolic.  Epi drip administered at 2 mcg/min.  Initial CBG was 51, treated with D10. Chronically takes oral morphine, oxycodone, and clonazepam.  Also reportedly took 3 Tylenol PM last night. Sustained a fall 3 days ago.     Past Medical History:  Diagnosis Date  . ADD (attention deficit disorder) 05/24/2012  . ANEMIA, MILD 06/26/2008   Qualifier: Diagnosis of  By: Janit Bern    . Anxiety   . Arthritis    "joints, back" (10/02/2013)  . Chronic back pain    "all over my back" (10/02/2013)  . Congestive heart disease (HCC)    "I have the beginning signs" (10/02/2013)  . Crushing injury of arm, right 02/06/1989's   "it was crushed; wore cast from fingers to top of my shoulder" (11/25/2  . Crushing injury of left wrist 05/11/1989's  . DIZZINESS 12/31/2009   Qualifier: Diagnosis of  By: Oswald Hillock    . Fibromyalgia   . GERD 12/03/2008   Qualifier: Diagnosis of  By: Freddy Jaksch    . HYPERLIPIDEMIA 05/15/2007   Qualifier: Diagnosis of  By: Janit Bern ; pt denies this hx on 10/02/2013  . Hypertension   . Idiopathic scoliosis 04/19/2013  . Migraines    "once/month" (10/02/2013)  . Osteoporosis, unspecified 04/19/2013  . Other vitamin B12 deficiency anemia 10/30/2009   Qualifier: Diagnosis  of  By: Floydene Flock    . PALPITATIONS, RECURRENT 12/12/2009   Qualifier: Diagnosis of  By: Janit Bern    . Pica 12/28/2007   Qualifier: Diagnosis of  By: Janit Bern    . Pneumonia 10/02/2013   "had onsets of it before; not full blown til now" (10/02/2013)  . PSTPRC STATUS, BARIATRIC SURGERY 05/15/2007   Qualifier: Diagnosis of  By: Janit Bern    . Pulmonary nodules/lesions, multiple   . TREMOR, ESSENTIAL 12/12/2009   Qualifier: Diagnosis of  By: Janit Bern      Patient Active Problem List   Diagnosis Date Noted  . Obstruction of left ureteropelvic junction (UPJ) due to stone 08/21/2018  . Hypoalbuminemia 08/21/2018  . Chronic pain disorder 08/21/2018  . Thrombocytopenia (HCC) 08/21/2018  . Acute renal failure (ARF) (HCC) 08/21/2018  . Compression fracture of spine (HCC) 08/21/2018  . Acute respiratory failure with hypoxemia (HCC)   . Hematoma 06/03/2018  . Dysphagia 02/07/2018  . Generalized anxiety disorder 07/04/2017  . Nonintractable headache 07/04/2017  . Exertional dyspnea 08/05/2016  . Sepsis (HCC) 03/07/2015  . Hypokalemia 03/07/2015  . Acute encephalopathy 03/06/2015  . Fever 03/06/2015  . Chronic back pain 01/15/2015  . Obesity (BMI 30-39.9) 10/15/2013  . Pulmonary nodules 10/15/2013  . CAP (community acquired pneumonia) 10/02/2013  . Essential hypertension 08/24/2013  . Idiopathic  scoliosis 04/19/2013  . Back pain 04/19/2013  . Osteoporosis 04/19/2013  . Edema 04/19/2013  . Mild diastolic dysfunction 01/01/2013  . Leg pain, bilateral 12/16/2012  . ADD (attention deficit disorder) 05/24/2012  . Involuntary movements 05/24/2012  . DIZZINESS 12/31/2009  . TREMOR, ESSENTIAL 12/12/2009  . PALPITATIONS, RECURRENT 12/12/2009  . OTHER VITAMIN B12 DEFICIENCY ANEMIA 10/30/2009  . FEMALE STRESS INCONTINENCE 10/28/2009  . MEMORY LOSS 10/28/2009  . Morbid obesity (HCC) 12/03/2008  . GERD 12/03/2008  . ANEMIA, MILD 06/26/2008  . DEPRESSION/ANXIETY  12/28/2007  . PICA 12/28/2007  . ACUTE BRONCHITIS 12/28/2007  . FATIGUE 12/28/2007  . Hyperlipidemia 05/15/2007  . ARTIFICIAL MENOPAUSE 05/15/2007  . Myalgia and myositis, unspecified 05/15/2007  . HEADACHE 05/15/2007  . CHEST PAIN, ATYPICAL 05/15/2007  . SYMPTOM, PAIN, ABDOMINAL, EPIGASTRIC 05/15/2007  . REDUCTION MAMMOPLASTY, HX OF 05/15/2007  . PSTPRC STATUS, BARIATRIC SURGERY 05/15/2007    Past Surgical History:  Procedure Laterality Date  . ABDOMINAL HYSTERECTOMY  1980's  . APPENDECTOMY  1980's   "w/either my hysterectomy or myomectomy" (10/02/2013)  . BACK SURGERY    . CLOSED REDUCTION HAND FRACTURE Right 1990's   "it was crushed; wore cast from fingers to top of my shoulder" (10/02/2013)  . GASTRIC BYPASS  ~ 2003  . KNEE ARTHROSCOPY Right 2002  . MYOMECTOMY  1980's  . REDUCTION MAMMAPLASTY Bilateral 2001  . REFRACTIVE SURGERY Bilateral 2004  . SPINAL CORD STIMULATOR IMPLANT    . TONSILLECTOMY AND ADENOIDECTOMY  1955  . TUBAL LIGATION  1980's     OB History   None      Home Medications    Prior to Admission medications   Medication Sig Start Date End Date Taking? Authorizing Provider  amitriptyline (ELAVIL) 75 MG tablet Take 75 mg by mouth at bedtime. 08/10/18  Yes [provider]  amLODipine (NORVASC) 5 MG tablet Take 1 tablet (5 mg total) by mouth daily. 08/10/18  Yes Lars Masson, MD  amphetamine-dextroamphetamine (ADDERALL XR) 20 MG 24 hr capsule 2 po qam Patient taking differently: Take 20 mg by mouth daily.  08/11/18  Yes Wendling, Jilda Roche, DO  baclofen (LIORESAL) 10 MG tablet Take 10 mg by mouth 3 (three) times daily. 06/12/18  Yes [provider]  clonazePAM (KLONOPIN) 0.5 MG tablet Take 0.5 tablets (0.25 mg total) by mouth daily as needed for anxiety. 08/11/18  Yes Wendling, Jilda Roche, DO  Cyanocobalamin (VITAMIN B-12) 1000 MCG SUBL Place 10,000 mcg under the tongue daily at 12 noon.   Yes [provider]  ibuprofen  (ADVIL,MOTRIN) 200 MG tablet Take 200 mg by mouth as needed.   Yes [provider]  morphine (MSIR) 15 MG tablet Take 15 mg by mouth every 12 (twelve) hours as needed for severe pain (for pain).   Yes [provider]  Oxycodone HCl 20 MG TABS Take 1 tablet by mouth 4 (four) times daily as needed (pain).  03/06/15  Yes [provider]  SUMAtriptan (IMITREX) 50 MG tablet Take 1 tablet (50 mg total) by mouth every 2 (two) hours as needed for migraine. May repeat in 2 hours if headache persists or recurs. 04/12/18  Yes [name] R, DO  venlafaxine XR (EFFEXOR-XR) 75 MG 24 hr capsule Take 3 capsules (225 mg total) by mouth daily. 02/07/18  Yes Donato Schultz, DO  doxycycline (VIBRA-TABS) 100 MG tablet Take 1 tablet (100 mg total) by mouth 2 (two) times daily. Patient not taking: Reported on 08/21/2018 07/13/18  Zola Button, [name], DO  omeprazole (PRILOSEC) 20 MG capsule Take 1 capsule (20 mg total) by mouth daily. Patient not taking: Reported on 08/21/2018 02/07/18   Donato Schultz, DO    Family History Family History  Adopted: Yes  Problem Relation Age of Onset  . COPD Mother   . Emphysema Mother   . Heart attack Mother   . Other Mother        tobacco abuse    Social History Social History   Tobacco Use  . Smoking status: Never Smoker  . Smokeless tobacco: Never Used  Substance Use Topics  . Alcohol use: No  . Drug use: No     Allergies   Patient has no known allergies.   Review of Systems Review of Systems  Unable to perform ROS: Acuity of condition     Physical Exam Updated Vital Signs BP (!) 72/56   Pulse (!) 108   Temp 97.8 F (36.6 C) (Rectal)   Resp 16   SpO2 92%   Physical Exam  Constitutional: She appears ill. No distress.  HENT:  Head: Normocephalic and atraumatic.  Mouth/Throat: Mucous membranes are dry.  Patient has dry, blue-colored substance on the tongue.  She is maintaining her own airway.  Eyes:  Conjunctivae are normal.  Pupils approximately 3 mm bilaterally  Neck: Neck supple.  Cardiovascular: Normal rate, regular rhythm, normal heart sounds and intact distal pulses.  Pulmonary/Chest: Effort normal and breath sounds normal. No respiratory distress.  Abdominal: Soft. There is tenderness. There is no guarding.  Groans with palpation anywhere on the abdomen.  Genitourinary:  Genitourinary Comments: No external hemorrhoids, fissures, or lesions noted. No gross blood, melena, or stool burden. No foreign bodies noted.  RN, Tresa Endo, served as chaperone during the rectal exam.  Musculoskeletal: She exhibits no edema.  Lymphadenopathy:    She has no cervical adenopathy.  Neurological: GCS eye subscore is 2. GCS verbal subscore is 2. GCS motor subscore is 4.  Moans or grunts to painful stimuli  Skin: Skin is warm and dry. She is not diaphoretic.  Poor skin turgor.  Skin is hot to touch.  Psychiatric: She has a normal mood and affect. Her behavior is normal.  Nursing note and vitals reviewed.    ED Treatments / Results  Labs (all labs ordered are listed, but only abnormal results are displayed) Labs Reviewed  CBC WITH DIFFERENTIAL/PLATELET - Abnormal; Notable for the following components:      Result Value   WBC 11.3 (*)    RBC 3.40 (*)    Hemoglobin 10.1 (*)    HCT 32.0 (*)    Platelets 50 (*)    Neutro Abs 10.0 (*)    Lymphs Abs 0.4 (*)    All other components within normal limits  COMPREHENSIVE METABOLIC PANEL - Abnormal; Notable for the following components:   Sodium 134 (*)    Potassium 3.3 (*)    CO2 21 (*)    BUN 53 (*)    Creatinine, Ser 2.92 (*)    Calcium 7.1 (*)    Total Protein 4.1 (*)    Albumin 2.0 (*)    AST 51 (*)    GFR calc non Af Amer 15 (*)    GFR calc Af Amer 18 (*)    All other components within normal limits  URINALYSIS, ROUTINE W REFLEX MICROSCOPIC - Abnormal; Notable for the following components:   APPearance TURBID (*)    Hgb urine dipstick  MODERATE (*)  Protein, ur 100 (*)    Leukocytes, UA MODERATE (*)    All other components within normal limits  RAPID URINE DRUG SCREEN, HOSP PERFORMED - Abnormal; Notable for the following components:   Opiates POSITIVE (*)    Amphetamines POSITIVE (*)    All other components within normal limits  URINALYSIS, MICROSCOPIC (REFLEX) - Abnormal; Notable for the following components:   Bacteria, UA MANY (*)    Non Squamous Epithelial PRESENT (*)    All other components within normal limits  I-STAT CG4 LACTIC ACID, ED - Abnormal; Notable for the following components:   Lactic Acid, Venous 3.09 (*)    All other components within normal limits  I-STAT CG4 LACTIC ACID, ED - Abnormal; Notable for the following components:   Lactic Acid, Venous 2.35 (*)    All other components within normal limits  CULTURE, BLOOD (ROUTINE X 2)  CULTURE, BLOOD (ROUTINE X 2)  URINE CULTURE  ETHANOL  SALICYLATE LEVEL  ACETAMINOPHEN LEVEL  TROPONIN I  HIV ANTIBODY (ROUTINE TESTING W REFLEX)  MAGNESIUM  LACTIC ACID, PLASMA  LACTIC ACID, PLASMA  PROCALCITONIN  CBG MONITORING, ED  POC OCCULT BLOOD, ED   Hemoglobin  Date Value Ref Range Status  08/21/2018 10.1 (L) 12.0 - 15.0 g/dL Final  16/08/9603 54.0 12.0 - 15.0 g/dL Final  98/09/9146 82.9 12.0 - 15.0 g/dL Final  56/21/3086 57.8 11.7 - 15.5 g/dL Final   BUN  Date Value Ref Range Status  08/21/2018 53 (H) 8 - 23 mg/dL Final  46/96/2952 18 6 - 20 mg/dL Final  84/13/2440 11 6 - 20 mg/dL Final  09/04/2535 15 7 - 25 mg/dL Final   Creat  Date Value Ref Range Status  07/05/2016 0.89 0.50 - 0.99 mg/dL Final    Comment:      For patients > or = 70 years of age: The upper reference limit for Creatinine is approximately 13% higher for people identified as African-American.      Creatinine, Ser  Date Value Ref Range Status  08/21/2018 2.92 (H) 0.44 - 1.00 mg/dL Final  64/40/3474 2.59 0.44 - 1.00 mg/dL Final  56/38/7564 3.32 0.44 - 1.00 mg/dL Final   95/18/8416 6.06 0.50 - 1.10 mg/dL Final     EKG EKG Interpretation  Date/Time:  Monday August 21 2018 10:40:42 EDT Ventricular Rate:  101 PR Interval:    QRS Duration: 93 QT Interval:  348 QTC Calculation: 452 R Axis:   -18 Text Interpretation:  Sinus tachycardia Nonspecific T wave abnormality Confirmed by Cathren Laine ([id number]) on 08/21/2018 11:04:00 AM   Radiology Ct Abdomen Pelvis Wo Contrast  Result Date: 08/21/2018 CLINICAL DATA:  Fall yesterday.  Abdominal pain. EXAM: CT ABDOMEN AND PELVIS WITHOUT CONTRAST TECHNIQUE: Multidetector CT imaging of the abdomen and pelvis was performed following the standard protocol without IV contrast. COMPARISON:  07/17/2017. FINDINGS: Lower chest: Bibasilar dependent atelectasis. Heart size normal. No pericardial or pleural effusion. Hepatobiliary: Liver is grossly unremarkable. Gallbladder is distended. No definite biliary ductal dilatation. Pancreas: Grossly unremarkable. Spleen: Negative. Adrenals/Urinary Tract: Adrenal glands and right kidney are grossly unremarkable. Moderate left hydronephrosis secondary to a 6 mm stone at the left ureteropelvic junction. Air is seen in the bladder and is presumably iatrogenic. Stomach/Bowel: Postoperative changes involving the stomach. Stomach and small bowel are otherwise grossly unremarkable. Fair amount of stool is seen in the colon, indicative of constipation. Vascular/Lymphatic: Atherosclerotic calcification of the arterial vasculature without abdominal aortic aneurysm. No definite pathologically enlarged lymph nodes. Reproductive: No adnexal mass.  Other: No free fluid. Mesenteries and peritoneum are grossly unremarkable. Musculoskeletal: Degenerative changes in the spine. No worrisome lytic or sclerotic lesions. Spinal stimulator wires are seen in the lower thoracic spine. Thoracolumbar compression deformities with L1 vertebral body augmentation. IMPRESSION: 1. Moderate left hydronephrosis secondary to a 6 mm  stone at the left ureteropelvic junction. 2. Stool throughout the colon is indicative of constipation. 3. Gallbladder is distended. Please correlate for right upper quadrant symptoms. 4.  Aortic atherosclerosis (ICD10-170.0). Electronically Signed   By: Leanna Battles M.D.   On: 08/21/2018 14:22   Ct Head Wo Contrast  Result Date: 08/21/2018 CLINICAL DATA:  Recent fall with head injury, initial encounter EXAM: CT HEAD WITHOUT CONTRAST CT CERVICAL SPINE WITHOUT CONTRAST TECHNIQUE: Multidetector CT imaging of the head and cervical spine was performed following the standard protocol without intravenous contrast. Multiplanar CT image reconstructions of the cervical spine were also generated. COMPARISON:  None. FINDINGS: CT HEAD FINDINGS Brain: Mild atrophic changes are noted. No findings to suggest acute hemorrhage, acute infarction or space-occupying mass lesion are seen. Mild chronic white matter ischemic changes are noted as well. Vascular: No hyperdense vessel or unexpected calcification. Skull: Normal. Negative for fracture or focal lesion. Sinuses/Orbits: No acute finding. Other: None. CT CERVICAL SPINE FINDINGS Alignment: Alignment is well maintained. Skull base and vertebrae: 7 cervical segments are well visualized. Vertebral body height is well maintained. Multilevel facet hypertrophic changes are noted. No acute fracture or acute facet abnormality is seen. Multilevel neural foraminal changes are noted. Soft tissues and spinal canal: Surrounding soft tissues are within normal limits. Upper chest: Scarring is noted in the left apex. No other focal abnormality is noted. Other: None IMPRESSION: CT of the head: Chronic atrophic and ischemic changes without acute abnormality. CT of the cervical spine: Multilevel degenerative change without acute abnormality. Electronically Signed   By: Alcide Clever M.D.   On: 08/21/2018 12:25   Ct Cervical Spine Wo Contrast  Result Date: 08/21/2018 CLINICAL DATA:  Recent  fall with head injury, initial encounter EXAM: CT HEAD WITHOUT CONTRAST CT CERVICAL SPINE WITHOUT CONTRAST TECHNIQUE: Multidetector CT imaging of the head and cervical spine was performed following the standard protocol without intravenous contrast. Multiplanar CT image reconstructions of the cervical spine were also generated. COMPARISON:  None. FINDINGS: CT HEAD FINDINGS Brain: Mild atrophic changes are noted. No findings to suggest acute hemorrhage, acute infarction or space-occupying mass lesion are seen. Mild chronic white matter ischemic changes are noted as well. Vascular: No hyperdense vessel or unexpected calcification. Skull: Normal. Negative for fracture or focal lesion. Sinuses/Orbits: No acute finding. Other: None. CT CERVICAL SPINE FINDINGS Alignment: Alignment is well maintained. Skull base and vertebrae: 7 cervical segments are well visualized. Vertebral body height is well maintained. Multilevel facet hypertrophic changes are noted. No acute fracture or acute facet abnormality is seen. Multilevel neural foraminal changes are noted. Soft tissues and spinal canal: Surrounding soft tissues are within normal limits. Upper chest: Scarring is noted in the left apex. No other focal abnormality is noted. Other: None IMPRESSION: CT of the head: Chronic atrophic and ischemic changes without acute abnormality. CT of the cervical spine: Multilevel degenerative change without acute abnormality. Electronically Signed   By: Alcide Clever M.D.   On: 08/21/2018 12:25   Dg Pelvis Portable  Result Date: 08/21/2018 CLINICAL DATA:  Pelvic pain EXAM: PORTABLE PELVIS 1-2 VIEWS COMPARISON:  None. FINDINGS: Pelvic ring is intact. No acute fracture is noted. Mild degenerative changes of the hip joints are  seen. No soft tissue changes are noted. IMPRESSION: Mild degenerative changes.  No acute abnormality seen. Electronically Signed   By: Alcide Clever M.D.   On: 08/21/2018 11:20   Ct L-spine No Charge  Result Date:  08/21/2018 CLINICAL DATA:  Back pain.  Fall.  Prior lumbar fractures. EXAM: CT LUMBAR SPINE WITHOUT CONTRAST TECHNIQUE: Multidetector CT imaging of the lumbar spine was performed without intravenous contrast administration. Multiplanar CT image reconstructions were also generated. COMPARISON:  CT lumbar myelogram 08/08/2017 FINDINGS: Segmentation: Normal Alignment: Mild anterolisthesis T11-T12, L1-2, L2-3, L3-4, L5-S1 Vertebrae: Chronic severe compression fracture T12 unchanged Chronic compression fracture L1 with interval cement vertebroplasty. No further fracture. Moderate to severe chronic compression fracture of L2 unchanged Mild superior endplate fracture of L3 unchanged Moderate compression fracture of L4 is new. Horizontal fracture line with sclerotic margins suggesting a subacute fracture with partial healing. This was not present previously. Moderate dextroscoliosis.  Negative for sacral fracture. Paraspinal and other soft tissues: Mild atherosclerotic aorta. No paraspinous soft tissue mass. Disc levels: T12-L1: Negative for stenosis L1-2: Negative for stenosis L2-3: Moderate right foraminal stenosis due to facet hypertrophy. C3-4: Mild facet degeneration without stenosis L4-5: Moderate right foraminal encroachment due to marked facet hypertrophy. Mild spinal stenosis L5-S1: Moderate facet degeneration bilaterally with subarticular stenosis bilaterally. IMPRESSION: Chronic compression fractures of T12, L1, L2, and L3 Moderate compression of fracture of L4 not present on 08/08/2017. This may be a subacute fracture given the sclerotic margins to the fracture. No retropulsion of bone and no spinal stenosis Multilevel degenerative change throughout the lumbar spine appears chronic. Electronically Signed   By: Marlan Palau M.D.   On: 08/21/2018 14:36   Dg Chest Portable 1 View  Result Date: 08/21/2018 CLINICAL DATA:  Altered mental status EXAM: PORTABLE CHEST 1 VIEW COMPARISON:  10/11/2016 FINDINGS:  Cardiac shadows within normal limits. Spinal stimulator is noted midthoracic spine. Overall inspiratory effort is poor. Patchy infiltrative changes are noted in the left base new from the prior exam. No bony abnormality is seen. IMPRESSION: New left basilar infiltrate. Electronically Signed   By: Alcide Clever M.D.   On: 08/21/2018 11:16    Procedures .Critical Care Performed by: Anselm Pancoast, PA-C Authorized by: Anselm Pancoast, PA-C   Critical care provider statement:    Critical care time (minutes):  45   Critical care time was exclusive of:  Separately billable procedures and treating other patients   Critical care was necessary to treat or prevent imminent or life-threatening deterioration of the following conditions:  Dehydration, renal failure and shock   Critical care was time spent personally by me on the following activities:  Development of treatment plan with patient or surrogate, discussions with consultants, evaluation of patient's response to treatment, examination of patient, obtaining history from patient or surrogate, review of old charts, re-evaluation of patient's condition, pulse oximetry, ordering and review of radiographic studies, ordering and review of laboratory studies and ordering and performing treatments and interventions   I assumed direction of critical care for this patient from another provider in my specialty: no     (including critical care time)  Medications Ordered in ED Medications  heparin injection 5,000 Units (has no administration in time range)  lactated ringers infusion (has no administration in time range)  famotidine (PEPCID) IVPB 20 mg premix (has no administration in time range)  potassium chloride 10 mEq in 100 mL IVPB (has no administration in time range)  phenylephrine (NEOSYNEPHRINE) 10-0.9 MG/250ML-% infusion (has no administration  in time range)  sodium chloride 0.9 % bolus 1,000 mL (0 mLs Intravenous Stopped 08/21/18 1233)  cefTRIAXone  (ROCEPHIN) 1 g in sodium chloride 0.9 % 100 mL IVPB (0 g Intravenous Stopped 08/21/18 1310)  azithromycin (ZITHROMAX) 500 mg in sodium chloride 0.9 % 250 mL IVPB (0 mg Intravenous Stopped 08/21/18 1416)  sodium chloride 0.9 % bolus 1,000 mL (0 mLs Intravenous Stopped 08/21/18 1359)    And  sodium chloride 0.9 % bolus 1,000 mL (0 mLs Intravenous Stopped 08/21/18 1538)  naloxone (NARCAN) injection 1 mg (1 mg Intravenous Given 08/21/18 1253)     Initial Impression / Assessment and Plan / ED Course  I have reviewed the triage vital signs and the nursing notes.  Pertinent labs & imaging results that were available during my care of the patient were reviewed by me and considered in my medical decision making (see chart for details).  Clinical Course as of Aug 21 1657  Mon Aug 21, 2018  1120 Patient now more responsive.  Her eyes are open spontaneously.  She will hold her hand off the bed on command, bilaterally.  She will wiggle her toes on command.  She will mumble responses to questions. Patient's husband is now at the bedside and is able to answer questions.  States patient sustained an unwitnessed fall 3 days ago (10/11).  He found her face down on the floor, but conscious.  She stayed in bed sleeping most without eating or drinking much for the next two days. This morning he states he could not wake her. She took her oxycodone, morphine, and Tylenol PM around 9:30 PM last night.   [SJ]  1305 Patient had improvement in her mental status with narcan. She is now able to fully follow commands. She seems to be focused on getting more pain medication.  After the purpose of Narcan was explained to the patient and her husband, that has been then admitted he gave her additional doses of her narcotic pain medication last night. We were better able to assess her and she was found to have midline lumbar spinal tenderness.   [SJ]  1505 Spoke with Dr. Ophelia Charter, hospitalist. Requests we speak to critical care  about this patient.    [SJ]  [id number] Spoke with Dr. Molli Knock, CCM. States it sounds like patient can go to stepdown, but they will send someone to assess the patient.    [SJ]  1545 Spoke with Dr. Dutch Quint, neurosurgeon. States patient can be placed in a lumbar corset for pain management. Can follow up with neurosurgery or ortho for further management.    [SJ]  [id number] Spoke with Dr. Marlou Porch, urologist. States he will see the patient, but recommends consulting IR as well.    [SJ]  1652 Spoke with Dr. Molli Knock to update him on Dr. Jasmine Awe recommendations. States he will take care of contact IR and any further management.   [SJ]    Clinical Course User Index [SJ] Joy, Shawn C, PA-C    Patient presents initially with altered mental status.  Found to have pneumonia on chest x-ray.  She also has an obstructing left ureteral stone with signs of infection.  Acute renal failure with creatinine of 2.9 and BUN of 53.  Drop in her hemoglobin from 13.9 to 10.1, though these values are separated by a year. Hypotensive intermittently throughout ED course, but not consistently to require vasopressors. Patient admitted for further management.  Findings and plan of care discussed with Cathren Laine, MD. Dr. Denton Lank personally  evaluated and examined this patient.  Vitals:   08/21/18 1445 08/21/18 1500 08/21/18 1502 08/21/18 1515  BP: (!) 82/56 (!) 85/64 93/60 95/61   Pulse: 96 (!) 105 (!) 102 95  Resp: (!) 24 (!) 26 (!) 23 (!) 25  Temp:      TempSrc:      SpO2: 98% 98% 99% 95%     Final Clinical Impressions(s) / ED Diagnoses   Final diagnoses:  Community acquired pneumonia of left lower lobe of lung (HCC)  Acute renal failure, unspecified acute renal failure type (HCC)  Dehydration  Tenderness  Left ureteral stone    ED Discharge Orders    None       Concepcion Living 08/21/18 1659    Cathren Laine, MD 08/22/18 [phone]

## 2018-08-21 NOTE — H&P (Addendum)
NAME:  [name], MRN:  [id number], DOB:  08/15/1946, LOS: 0 ADMISSION DATE:  08/21/2018, CONSULTATION DATE:  1/14 REFERRING MD:  yates, CHIEF COMPLAINT:  Sepsis   Brief History   72 year old admitted 10/14 w/ obstructive uropathy, left hydro, PNA and severe sepsis w/ resultant encephalopathy and AKI Past Medical History  Fibromyalgia, anemia, HTN Significant Hospital Events   Admitted 10/14  Consults: date of consult/date signed off & final recs:  PCCM 10/14 Urology called 10/14  Procedures (surgical and bedside):    Significant Diagnostic Tests:  Ct ABD/PELVIS 10/14 LEFT HYDRO W23mm stone. Bilateral PNA   Micro Data:  UC 10/14>>> BCX2 10/14>>>  Antimicrobials:  Rocephin 10/14>> Azith X 1  Subjective:  Not able  Objective   Blood pressure 95/61, pulse 95, temperature 97.8 F (36.6 C), temperature source Rectal, resp. rate (Abnormal) 25, SpO2 95 %.       No intake or output data in the 24 hours ending 08/21/18 1523 There were no vitals filed for this visit.  Examination: General: confused elderly 72 year old female. Not in acute distress but is encephalopathic HENT: MM dry. Dried caked pills on tonge. Neck veins flat Lungs: scattered rhonchi and basilar rales  Cardiovascular: tachy rrr no MRG Abdomen: warm dry Extremities: warm, brisk CR strong pulses  Neuro: confused. Moves all extremities  GU: yellow   Resolved Hospital Problem list     Assessment & Plan:  Acute metabolic Encephalopathy due to sepsis and acute renal failure. Likely a toxic component as well d/t medications Plan Treat sepsis Hold all sedating meds Supportive care  Severe sepsis/septic shock. Appears like UT source w/ left hydro Plan UC and BC sent Cont IVFs MAP goal > 90 (will place order for peripheral neo for now) IV zosyn to cover for UT as well as PNA   Acute hypoxic respiratory failure w/ bibasilar PNA -could be aspiration vs evolving ALI Plan Supplemental oxygen   IS NPO Hold off intubation for now  Lactic acidosis in setting of sepsis Plan Cont supportive care  AKI in setting of left hydro/obstructive uropathy w/ 6 mm stone Plan Cont IV hydration Urology consulted  Will need perc drain abx as above F/u serial chemistries.   Hypokalemia Plan Replace and recheck   Chronic pain walks w/ cane  Plan Supportive care     Disposition / Summary of Today's Plan 08/21/18   Admit to ICU treat as sepsis w/ UT source.      Diet: NPO Pain/Anxiety/Delirium protocol (if indicated): NA VAP protocol (if indicated): NA DVT prophylaxis: [place] heparin  GI prophylaxis: NA Hyperglycemia protocol: SSI Mobility: BR Code Status: full code  Family Communication: pending  Labs   CBC: Recent Labs  Lab 08/21/18 1057  WBC 11.3*  NEUTROABS 10.0*  HGB 10.1*  HCT 32.0*  MCV 94.1  PLT 50*    Basic Metabolic Panel: Recent Labs  Lab 08/21/18 1057  NA 134*  K 3.3*  CL 102  CO2 21*  GLUCOSE 93  BUN 53*  CREATININE 2.92*  CALCIUM 7.1*   GFR: CrCl cannot be calculated (Unknown ideal weight.). Recent Labs  Lab 08/21/18 1057 08/21/18 1139 08/21/18 1254  WBC 11.3*  --   --   LATICACIDVEN  --  3.09* 2.35*    Liver Function Tests: Recent Labs  Lab 08/21/18 1057  AST 51*  ALT 16  ALKPHOS 58  BILITOT 0.8  PROT 4.1*  ALBUMIN 2.0*   No results for input(s): LIPASE, AMYLASE  in the last 168 hours. No results for input(s): AMMONIA in the last 168 hours.  ABG    Component Value Date/Time   TCO2 26 10/02/2013 1317     Coagulation Profile: No results for input(s): INR, PROTIME in the last 168 hours.  Cardiac Enzymes: No results for input(s): CKTOTAL, CKMB, CKMBINDEX, TROPONINI in the last 168 hours.  HbA1C: Hgb A1c MFr Bld  Date/Time Value Ref Range Status  04/22/2014 11:53 AM 5.3 4.6 - 6.5 % Final    Comment:    Glycemic Control Guidelines for People with Diabetes:Non Diabetic:  <6%Goal of Therapy: <7%Additional Action  Suggested:  >8%     CBG: No results for input(s): GLUCAP in the last 168 hours.  Admitting History of Present Illness.   71 year old female presented to ED on 10/14 after being found unresponsive by husband. Apparently had fall couple days prior. Had been getting increased narcotics at home for back pain. On  Arrival found responsive only to pain, hypotensive w/ sbp in 70s, and hypoxic (80s on room air). LA was initially 3.09. Cultures sent. Received 3 liters crystalloid, cr  Was 2.92. CT abd/pelvis showed mod left hydro w/ 6mm stone. Her GB was slightly distended also showed L>R changes c/w PNA. As BP remained in 90s and required high flow O2 PCCM asked to see.   Review of Systems:   Unable d/t delirium   Past Medical History  She,  has a past medical history of ADD (attention deficit disorder) (05/24/2012), ANEMIA, MILD (06/26/2008), Anxiety, Arthritis, Chronic back pain, Congestive heart disease (HCC), Crushing injury of arm, right (02/06/1989's), Crushing injury of left wrist (05/11/1989's), DIZZINESS (12/31/2009), Fibromyalgia, GERD (12/03/2008), HYPERLIPIDEMIA (05/15/2007), Hypertension, Idiopathic scoliosis (04/19/2013), Migraines, Osteoporosis, unspecified (04/19/2013), Other vitamin B12 deficiency anemia (10/30/2009), PALPITATIONS, RECURRENT (12/12/2009), Pica (12/28/2007), Pneumonia (10/02/2013), PSTPRC STATUS, BARIATRIC SURGERY (05/15/2007), Pulmonary nodules/lesions, multiple, and TREMOR, ESSENTIAL (12/12/2009).   Surgical History    Past Surgical History:  Procedure Laterality Date  . ABDOMINAL HYSTERECTOMY  1980's  . APPENDECTOMY  1980's   "w/either my hysterectomy or myomectomy" (10/02/2013)  . BACK SURGERY    . CLOSED REDUCTION HAND FRACTURE Right 1990's   "it was crushed; wore cast from fingers to top of my shoulder" (10/02/2013)  . GASTRIC BYPASS  ~ 2003  . KNEE ARTHROSCOPY Right 2002  . MYOMECTOMY  1980's  . REDUCTION MAMMAPLASTY Bilateral 2001  . REFRACTIVE SURGERY Bilateral 2004  .  SPINAL CORD STIMULATOR IMPLANT    . TONSILLECTOMY AND ADENOIDECTOMY  1955  . TUBAL LIGATION  1980's     Social History   Social History   Socioeconomic History  . Marital status: Married    Spouse name: Not on file  . Number of children: 2  . Years of education: Not on file  . Highest education level: Not on file  Occupational History  . Occupation: Retired  Engineer, production  . Financial resource strain: Not on file  . Food insecurity:    Worry: Not on file    Inability: Not on file  . Transportation needs:    Medical: Not on file    Non-medical: Not on file  Tobacco Use  . Smoking status: Never Smoker  . Smokeless tobacco: Never Used  Substance and Sexual Activity  . Alcohol use: No  . Drug use: No  . Sexual activity: Never    Partners: Male  Lifestyle  . Physical activity:    Days per week: Not on file    Minutes per  session: Not on file  . Stress: Not on file  Relationships  . Social connections:    Talks on phone: Not on file    Gets together: Not on file    Attends religious service: Not on file    Active member of club or organization: Not on file    Attends meetings of clubs or organizations: Not on file    Relationship status: Not on file  . Intimate partner violence:    Fear of current or ex partner: Not on file    Emotionally abused: Not on file    Physically abused: Not on file    Forced sexual activity: Not on file  Other Topics Concern  . Not on file  Social History Narrative  . Not on file  ,  reports that she has never smoked. She has never used smokeless tobacco. She reports that she does not drink alcohol or use drugs.   Family History   Her family history includes COPD in her mother; Emphysema in her mother; Heart attack in her mother; Other in her mother. She was adopted.   Allergies No Known Allergies   Home Medications  Prior to Admission medications   Medication Sig Start Date End Date Taking? Authorizing Provider  amitriptyline  (ELAVIL) 75 MG tablet Take 75 mg by mouth at bedtime. 08/10/18  Yes [provider]  amLODipine (NORVASC) 5 MG tablet Take 1 tablet (5 mg total) by mouth daily. 08/10/18  Yes Lars Masson, MD  amphetamine-dextroamphetamine (ADDERALL XR) 20 MG 24 hr capsule 2 po qam Patient taking differently: Take 20 mg by mouth daily.  08/11/18  Yes Wendling, Jilda Roche, DO  baclofen (LIORESAL) 10 MG tablet Take 10 mg by mouth 3 (three) times daily. 06/12/18  Yes [provider]  clonazePAM (KLONOPIN) 0.5 MG tablet Take 0.5 tablets (0.25 mg total) by mouth daily as needed for anxiety. 08/11/18  Yes Wendling, Jilda Roche, DO  Cyanocobalamin (VITAMIN B-12) 1000 MCG SUBL Place 10,000 mcg under the tongue daily at 12 noon.   Yes [provider]  ibuprofen (ADVIL,MOTRIN) 200 MG tablet Take 200 mg by mouth as needed.   Yes [provider]  morphine (MSIR) 15 MG tablet Take 15 mg by mouth every 12 (twelve) hours as needed for severe pain (for pain).   Yes [provider]  Oxycodone HCl 20 MG TABS Take 1 tablet by mouth 4 (four) times daily as needed (pain).  03/06/15  Yes [provider]  SUMAtriptan (IMITREX) 50 MG tablet Take 1 tablet (50 mg total) by mouth every 2 (two) hours as needed for migraine. May repeat in 2 hours if headache persists or recurs. 04/12/18  Yes [name] R, DO  venlafaxine XR (EFFEXOR-XR) 75 MG 24 hr capsule Take 3 capsules (225 mg total) by mouth daily. 02/07/18  Yes Donato Schultz, DO  doxycycline (VIBRA-TABS) 100 MG tablet Take 1 tablet (100 mg total) by mouth 2 (two) times daily. Patient not taking: Reported on 08/21/2018 07/13/18   Zola Button, Grayling Congress, DO  omeprazole (PRILOSEC) 20 MG capsule Take 1 capsule (20 mg total) by mouth daily. Patient not taking: Reported on 08/21/2018 02/07/18   Donato Schultz, DO     Critical care time: 36 min      Simonne Martinet ACNP-BC [facility] Pulmonary/Critical Care Pager #  [phone] OR # [phone] if no answer  Attending Note:  72 year old female with PMH who presents to  the ER with obstructive hydronephrosis from renal stone, associated aspiration PNA with septic shock and hypoxemic respiratory failure.  On exam, lungs with distant BS and significant decrease in BS on the bases.  I reviewed abdominal CT myself, left sided hydronephrosis noted with a stone and LLL infiltrate noted.  Discussed with EDP and PCCM-NP.  Will pan culture, start zosyn, does not meet criteria for HCAP.  Urology consult called.  Continue abx.  Maintain on NRB for now, if deteriorates may need intubation.  IVF resuscitation.  No pressors for now.  PCCM will admit to the ICU.  Hold all psych medications while patient is critically ill in the ICU then restart once out of the ICU.  The patient is critically ill with multiple organ systems failure and requires high complexity decision making for assessment and support, frequent evaluation and titration of therapies, application of advanced monitoring technologies and extensive interpretation of multiple databases.   Critical Care Time devoted to patient care services described in this note is  45  Minutes. This time reflects time of care of this signee Dr Koren Bound. This critical care time does not reflect procedure time, or teaching time or supervisory time of PA/NP/Med student/Med Resident etc but could involve care discussion time.  Alyson Reedy, M.D. [facility] Pulmonary/Critical Care Medicine. Pager: [phone]. After hours pager: [phone].

## 2018-08-21 NOTE — Procedures (Deleted)
Central Venous Catheter Insertion Procedure Note [name] [id number] 05/15/1946  Procedure: Insertion of Central Venous Catheter Indications: Assessment of intravascular volume, Drug and/or fluid administration and Frequent blood sampling  Procedure Details Consent: Risks of procedure as well as the alternatives and risks of each were explained to the (patient/caregiver).  Consent for procedure obtained. Time Out: Verified patient identification, verified procedure, site/side was marked, verified correct patient position, special equipment/implants available, medications/allergies/relevent history reviewed, required imaging and test results available.  Performed  Maximum sterile technique was used including antiseptics, cap, gloves, gown, hand hygiene, mask and sheet. Skin prep: Chlorhexidine; local anesthetic administered A antimicrobial bonded/coated triple lumen catheter was placed in the left internal jugular vein using the Seldinger technique.  Evaluation Blood flow good Complications: No apparent complications Patient did tolerate procedure well. Chest X-ray ordered to verify placement.  CXR: pending.  U/S used in placement.  [name] 08/21/2018, 4:40 PM

## 2018-08-21 NOTE — Progress Notes (Signed)
eLink Physician-Brief Progress Note Patient Name: [name] DOB: 07/12/1946 MRN: [id number]   Date of Service  08/21/2018  HPI/Events of Note  Hypoglycemia - Blood glucose = 44. Already give D50.  eICU Interventions  Will order: 1. Change IV fluid to D5 0.9 NaCl to run IV at 75 mL/hour.      Intervention Category Major Interventions: Other:  Lenell Antu 08/21/2018, 11:49 PM

## 2018-08-21 NOTE — ED Notes (Signed)
Returned from CT , patient remains alert

## 2018-08-21 NOTE — Progress Notes (Signed)
eLink Physician-Brief Progress Note Patient Name: KITIARA HINTZE DOB: 1946-02-21 MRN: [id number]   Date of Service  08/21/2018  HPI/Events of Note  Multiple issues: 1. K+ = 3.3 and Creatinine = 2.92, 2. LA = 3.5 - LVEF = 60-65% and 3. Troponin = 0.05 - Demand ischemia? - EKG with nonspecific ST-T changes.  eICU Interventions  Will order: 1. Repeat BMP now.  2. Bolus with 0.9 NaCl 1 liter IV over 1 hour now.  3. ASA Suppository 150 mg PR now and Q day. 4. Continue to cycle Troponin.      Intervention Category Major Interventions: Electrolyte abnormality - evaluation and management Intermediate Interventions: Diagnostic test evaluation  Sommer,Steven Eugene 08/21/2018, 8:18 PM

## 2018-08-21 NOTE — ED Triage Notes (Signed)
[facility]- pt coming from home, had a fall yesterday and hit her head. She takes oxycodone, morphine, and clonazepam at night. Last night she had 3 tylenol PM. Pt responsive to pain only. Pt warm to touch. Hypotensive in route and epi drip started.

## 2018-08-21 NOTE — Consult Note (Signed)
Consult Note   [name] ZOX:[id number] DOB: [birth year] DOA: 08/21/2018  PCP: Donato Schultz, DO  Patient coming from:  Home - lives with husband  Chief Complaint: AMS  HPI: [name] is a 73 y.o. female with medical history significant of fibromyalgia; HTN; HLD; and s/p bariatric surgery presenting with AMS.  She is alert now but perseverative and unable to provide effective history.  She was unaccompanied at the time of my evaluation.  ED Course:  AMS.  Fall on Friday, in bed all weekend with decreased PO x 2 days.  Today, her husband found her unresponsive.  1- likely overdose or narcotics, some response to Narcan.  2- Hgb decreased from 13.9 to 10.1.  Heme negative.  3- Quite weak and pretty sick with acute renal failure.  Creatinine 2.9, previously 0.9.  4- 6 mm stone in left UPJ with moderate left hydronephrosis.  Will call urology.  5- L spine with compression fracture, neurosurgery call pending.  6- Pneumonia.  BP is variable. 82/56 last recorded.  Currently 93/60, MAP 72.  Will have PCCM consult.  Review of Systems: Unable to perform   PMH, PSH, SH, and FH reviewed in Epic  Past Medical History:  Diagnosis Date  . ADD (attention deficit disorder) 05/24/2012  . ANEMIA, MILD 06/26/2008   Qualifier: Diagnosis of  By: Janit Bern    . Anxiety   . Arthritis    "joints, back" (10/02/2013)  . Chronic back pain    "all over my back" (10/02/2013)  . Congestive heart disease (HCC)    "I have the beginning signs" (10/02/2013)  . Crushing injury of arm, right 02/06/1989's   "it was crushed; wore cast from fingers to top of my shoulder" (11/25/2  . Crushing injury of left wrist 05/11/1989's  . DIZZINESS 12/31/2009   Qualifier: Diagnosis of  By: Oswald Hillock    . Fibromyalgia   . GERD 12/03/2008   Qualifier: Diagnosis of  By: Freddy Jaksch    . HYPERLIPIDEMIA 05/15/2007   Qualifier: Diagnosis of  By: Janit Bern ; pt denies this hx on 10/02/2013  .  Hypertension   . Idiopathic scoliosis 04/19/2013  . Migraines    "once/month" (10/02/2013)  . Osteoporosis, unspecified 04/19/2013  . Other vitamin B12 deficiency anemia 10/30/2009   Qualifier: Diagnosis of  By: Floydene Flock    . PALPITATIONS, RECURRENT 12/12/2009   Qualifier: Diagnosis of  By: Janit Bern    . Pica 12/28/2007   Qualifier: Diagnosis of  By: Janit Bern    . Pneumonia 10/02/2013   "had onsets of it before; not full blown til now" (10/02/2013)  . PSTPRC STATUS, BARIATRIC SURGERY 05/15/2007   Qualifier: Diagnosis of  By: Janit Bern    . Pulmonary nodules/lesions, multiple   . TREMOR, ESSENTIAL 12/12/2009   Qualifier: Diagnosis of  By: Janit Bern      Past Surgical History:  Procedure Laterality Date  . ABDOMINAL HYSTERECTOMY  1980's  . APPENDECTOMY  1980's   "w/either my hysterectomy or myomectomy" (10/02/2013)  . BACK SURGERY    . CLOSED REDUCTION HAND FRACTURE Right 1990's   "it was crushed; wore cast from fingers to top of my shoulder" (10/02/2013)  . GASTRIC BYPASS  ~ 2003  . KNEE ARTHROSCOPY Right 2002  . MYOMECTOMY  1980's  . REDUCTION MAMMAPLASTY Bilateral 2001  . REFRACTIVE SURGERY Bilateral 2004  . SPINAL CORD STIMULATOR IMPLANT    . TONSILLECTOMY AND ADENOIDECTOMY  1955  . TUBAL LIGATION  1980's    Social History   Socioeconomic History  . Marital status: Married    Spouse name: Not on file  . Number of children: 2  . Years of education: Not on file  . Highest education level: Not on file  Occupational History  . Occupation: Retired  Engineer, production  . Financial resource strain: Not on file  . Food insecurity:    Worry: Not on file    Inability: Not on file  . Transportation needs:    Medical: Not on file    Non-medical: Not on file  Tobacco Use  . Smoking status: Never Smoker  . Smokeless tobacco: Never Used  Substance and Sexual Activity  . Alcohol use: No  . Drug use: No  . Sexual activity: Never    Partners: Male    Lifestyle  . Physical activity:    Days per week: Not on file    Minutes per session: Not on file  . Stress: Not on file  Relationships  . Social connections:    Talks on phone: Not on file    Gets together: Not on file    Attends religious service: Not on file    Active member of club or organization: Not on file    Attends meetings of clubs or organizations: Not on file    Relationship status: Not on file  . Intimate partner violence:    Fear of current or ex partner: Not on file    Emotionally abused: Not on file    Physically abused: Not on file    Forced sexual activity: Not on file  Other Topics Concern  . Not on file  Social History Narrative  . Not on file    No Known Allergies  Family History  Adopted: Yes  Problem Relation Age of Onset  . COPD Mother   . Emphysema Mother   . Heart attack Mother   . Other Mother        tobacco abuse    Prior to Admission medications   Medication Sig Start Date End Date Taking? Authorizing Provider  amitriptyline (ELAVIL) 75 MG tablet Take 75 mg by mouth at bedtime. 08/10/18  Yes [provider]  amLODipine (NORVASC) 5 MG tablet Take 1 tablet (5 mg total) by mouth daily. 08/10/18  Yes Lars Masson, MD  amphetamine-dextroamphetamine (ADDERALL XR) 20 MG 24 hr capsule 2 po qam Patient taking differently: Take 20 mg by mouth daily.  08/11/18  Yes Wendling, Jilda Roche, DO  baclofen (LIORESAL) 10 MG tablet Take 10 mg by mouth 3 (three) times daily. 06/12/18  Yes [provider]  clonazePAM (KLONOPIN) 0.5 MG tablet Take 0.5 tablets (0.25 mg total) by mouth daily as needed for anxiety. 08/11/18  Yes Wendling, Jilda Roche, DO  Cyanocobalamin (VITAMIN B-12) 1000 MCG SUBL Place 10,000 mcg under the tongue daily at 12 noon.   Yes [provider]  ibuprofen (ADVIL,MOTRIN) 200 MG tablet Take 200 mg by mouth as needed.   Yes [provider]  morphine (MSIR) 15 MG tablet Take 15 mg by mouth every 12  (twelve) hours as needed for severe pain (for pain).   Yes [provider]  Oxycodone HCl 20 MG TABS Take 1 tablet by mouth 4 (four) times daily as needed (pain).  03/06/15  Yes [provider]  SUMAtriptan (IMITREX) 50 MG tablet Take 1 tablet (50 mg total) by mouth every 2 (two) hours as needed for  migraine. May repeat in 2 hours if headache persists or recurs. 04/12/18  Yes [name] R, DO  venlafaxine XR (EFFEXOR-XR) 75 MG 24 hr capsule Take 3 capsules (225 mg total) by mouth daily. 02/07/18  Yes Donato Schultz, DO  doxycycline (VIBRA-TABS) 100 MG tablet Take 1 tablet (100 mg total) by mouth 2 (two) times daily. Patient not taking: Reported on 08/21/2018 07/13/18   Zola Button, Grayling Congress, DO  omeprazole (PRILOSEC) 20 MG capsule Take 1 capsule (20 mg total) by mouth daily. Patient not taking: Reported on 08/21/2018 02/07/18   Donato Schultz, DO    Physical Exam: Vitals:   08/21/18 1445 08/21/18 1500 08/21/18 1502 08/21/18 1515  BP: (!) 82/56 (!) 85/64 93/60 95/61   Pulse: 96 (!) 105 (!) 102 95  Resp: (!) 24 (!) 26 (!) 23 (!) 25  Temp:      TempSrc:      SpO2: 98% 98% 99% 95%     General: Appears calm and comfortable and is NAD, but she is still somewhat somnolent and is clearly confused.  She appears much older than stated age. Eyes:   EOMI, normal lids, iris ENT:  grossly normal hearing, lips & tongue, mmm Neck:  no LAD, masses or thyromegaly; no carotid bruits Cardiovascular:  RR with tachycardia, no m/r/g. No LE edema.  Respiratory:   CTA bilaterally with no wheezes/rales/rhonchi.  Normal respiratory effort. Abdomen:  soft, NT, ND, NABS Skin:  no rash or induration seen on limited exam Musculoskeletal:  grossly normal tone BUE/BLE, good ROM, no bony abnormality. Psychiatric:  grossly normal mood and affect, speech repetitive and nonsensical, AOx2 (with repeated questioning) Neurologic: unable to assess    Radiological Exams on Admission: Ct  Abdomen Pelvis Wo Contrast  Result Date: 08/21/2018 CLINICAL DATA:  Fall yesterday.  Abdominal pain. EXAM: CT ABDOMEN AND PELVIS WITHOUT CONTRAST TECHNIQUE: Multidetector CT imaging of the abdomen and pelvis was performed following the standard protocol without IV contrast. COMPARISON:  07/17/2017. FINDINGS: Lower chest: Bibasilar dependent atelectasis. Heart size normal. No pericardial or pleural effusion. Hepatobiliary: Liver is grossly unremarkable. Gallbladder is distended. No definite biliary ductal dilatation. Pancreas: Grossly unremarkable. Spleen: Negative. Adrenals/Urinary Tract: Adrenal glands and right kidney are grossly unremarkable. Moderate left hydronephrosis secondary to a 6 mm stone at the left ureteropelvic junction. Air is seen in the bladder and is presumably iatrogenic. Stomach/Bowel: Postoperative changes involving the stomach. Stomach and small bowel are otherwise grossly unremarkable. Fair amount of stool is seen in the colon, indicative of constipation. Vascular/Lymphatic: Atherosclerotic calcification of the arterial vasculature without abdominal aortic aneurysm. No definite pathologically enlarged lymph nodes. Reproductive: No adnexal mass. Other: No free fluid. Mesenteries and peritoneum are grossly unremarkable. Musculoskeletal: Degenerative changes in the spine. No worrisome lytic or sclerotic lesions. Spinal stimulator wires are seen in the lower thoracic spine. Thoracolumbar compression deformities with L1 vertebral body augmentation. IMPRESSION: 1. Moderate left hydronephrosis secondary to a 6 mm stone at the left ureteropelvic junction. 2. Stool throughout the colon is indicative of constipation. 3. Gallbladder is distended. Please correlate for right upper quadrant symptoms. 4.  Aortic atherosclerosis (ICD10-170.0). Electronically Signed   By: Leanna Battles M.D.   On: 08/21/2018 14:22   Ct Head Wo Contrast  Result Date: 08/21/2018 CLINICAL DATA:  Recent fall with head  injury, initial encounter EXAM: CT HEAD WITHOUT CONTRAST CT CERVICAL SPINE WITHOUT CONTRAST TECHNIQUE: Multidetector CT imaging of the head and cervical spine was performed following the standard protocol without  intravenous contrast. Multiplanar CT image reconstructions of the cervical spine were also generated. COMPARISON:  None. FINDINGS: CT HEAD FINDINGS Brain: Mild atrophic changes are noted. No findings to suggest acute hemorrhage, acute infarction or space-occupying mass lesion are seen. Mild chronic white matter ischemic changes are noted as well. Vascular: No hyperdense vessel or unexpected calcification. Skull: Normal. Negative for fracture or focal lesion. Sinuses/Orbits: No acute finding. Other: None. CT CERVICAL SPINE FINDINGS Alignment: Alignment is well maintained. Skull base and vertebrae: 7 cervical segments are well visualized. Vertebral body height is well maintained. Multilevel facet hypertrophic changes are noted. No acute fracture or acute facet abnormality is seen. Multilevel neural foraminal changes are noted. Soft tissues and spinal canal: Surrounding soft tissues are within normal limits. Upper chest: Scarring is noted in the left apex. No other focal abnormality is noted. Other: None IMPRESSION: CT of the head: Chronic atrophic and ischemic changes without acute abnormality. CT of the cervical spine: Multilevel degenerative change without acute abnormality. Electronically Signed   By: Alcide Clever M.D.   On: 08/21/2018 12:25   Ct Cervical Spine Wo Contrast  Result Date: 08/21/2018 CLINICAL DATA:  Recent fall with head injury, initial encounter EXAM: CT HEAD WITHOUT CONTRAST CT CERVICAL SPINE WITHOUT CONTRAST TECHNIQUE: Multidetector CT imaging of the head and cervical spine was performed following the standard protocol without intravenous contrast. Multiplanar CT image reconstructions of the cervical spine were also generated. COMPARISON:  None. FINDINGS: CT HEAD FINDINGS Brain: Mild  atrophic changes are noted. No findings to suggest acute hemorrhage, acute infarction or space-occupying mass lesion are seen. Mild chronic white matter ischemic changes are noted as well. Vascular: No hyperdense vessel or unexpected calcification. Skull: Normal. Negative for fracture or focal lesion. Sinuses/Orbits: No acute finding. Other: None. CT CERVICAL SPINE FINDINGS Alignment: Alignment is well maintained. Skull base and vertebrae: 7 cervical segments are well visualized. Vertebral body height is well maintained. Multilevel facet hypertrophic changes are noted. No acute fracture or acute facet abnormality is seen. Multilevel neural foraminal changes are noted. Soft tissues and spinal canal: Surrounding soft tissues are within normal limits. Upper chest: Scarring is noted in the left apex. No other focal abnormality is noted. Other: None IMPRESSION: CT of the head: Chronic atrophic and ischemic changes without acute abnormality. CT of the cervical spine: Multilevel degenerative change without acute abnormality. Electronically Signed   By: Alcide Clever M.D.   On: 08/21/2018 12:25   Dg Pelvis Portable  Result Date: 08/21/2018 CLINICAL DATA:  Pelvic pain EXAM: PORTABLE PELVIS 1-2 VIEWS COMPARISON:  None. FINDINGS: Pelvic ring is intact. No acute fracture is noted. Mild degenerative changes of the hip joints are seen. No soft tissue changes are noted. IMPRESSION: Mild degenerative changes.  No acute abnormality seen. Electronically Signed   By: Alcide Clever M.D.   On: 08/21/2018 11:20   Ct L-spine No Charge  Result Date: 08/21/2018 CLINICAL DATA:  Back pain.  Fall.  Prior lumbar fractures. EXAM: CT LUMBAR SPINE WITHOUT CONTRAST TECHNIQUE: Multidetector CT imaging of the lumbar spine was performed without intravenous contrast administration. Multiplanar CT image reconstructions were also generated. COMPARISON:  CT lumbar myelogram 08/08/2017 FINDINGS: Segmentation: Normal Alignment: Mild anterolisthesis  T11-T12, L1-2, L2-3, L3-4, L5-S1 Vertebrae: Chronic severe compression fracture T12 unchanged Chronic compression fracture L1 with interval cement vertebroplasty. No further fracture. Moderate to severe chronic compression fracture of L2 unchanged Mild superior endplate fracture of L3 unchanged Moderate compression fracture of L4 is new. Horizontal fracture line with sclerotic margins  suggesting a subacute fracture with partial healing. This was not present previously. Moderate dextroscoliosis.  Negative for sacral fracture. Paraspinal and other soft tissues: Mild atherosclerotic aorta. No paraspinous soft tissue mass. Disc levels: T12-L1: Negative for stenosis L1-2: Negative for stenosis L2-3: Moderate right foraminal stenosis due to facet hypertrophy. C3-4: Mild facet degeneration without stenosis L4-5: Moderate right foraminal encroachment due to marked facet hypertrophy. Mild spinal stenosis L5-S1: Moderate facet degeneration bilaterally with subarticular stenosis bilaterally. IMPRESSION: Chronic compression fractures of T12, L1, L2, and L3 Moderate compression of fracture of L4 not present on 08/08/2017. This may be a subacute fracture given the sclerotic margins to the fracture. No retropulsion of bone and no spinal stenosis Multilevel degenerative change throughout the lumbar spine appears chronic. Electronically Signed   By: Marlan Palau M.D.   On: 08/21/2018 14:36   Dg Chest Portable 1 View  Result Date: 08/21/2018 CLINICAL DATA:  Altered mental status EXAM: PORTABLE CHEST 1 VIEW COMPARISON:  10/11/2016 FINDINGS: Cardiac shadows within normal limits. Spinal stimulator is noted midthoracic spine. Overall inspiratory effort is poor. Patchy infiltrative changes are noted in the left base new from the prior exam. No bony abnormality is seen. IMPRESSION: New left basilar infiltrate. Electronically Signed   By: Alcide Clever M.D.   On: 08/21/2018 11:16    EKG: Independently reviewed.  Sinus tachycardia  with rate 101; nonspecific ST changes with no evidence of acute ischemia   Labs on Admission: I have personally reviewed the available labs and imaging studies at the time of the admission.  Pertinent labs:   BUN 53/Creatinine 2.92/GFR 15; prior 15/0.89 in 8/17 Calcium 7.1 Albumin 2.0 AST 51/ALT 16 Lactate 3.09, 2.35 WBC 11.3 Hgb 10.1 Platelets 50 UA: moderate Hgb, moderate LE, 100 protein, many bacteria UDS: +amphetamines, opiates Heme negative ETOH, ASA negative  Assessment/Plan Principal Problem:   Sepsis (HCC) Active Problems:   ANEMIA, MILD   Mild diastolic dysfunction   Essential hypertension   CAP (community acquired pneumonia)   Acute encephalopathy   Obstruction of left ureteropelvic junction (UPJ) due to stone   Hypoalbuminemia   Chronic pain disorder   Thrombocytopenia (HCC)   Acute renal failure (ARF) (HCC)   Compression fracture of spine (HCC)   Sepsis, possibly from CAP vs. urosepsis -SIRS criteria in this patient includes: Leukocytosis, tachycardia, tachypnea, hypoxia  -Patient has evidence of acute organ failure with elevated lactate and intermittent hypotension -While awaiting blood cultures, this appears to be a preseptic condition. -Sepsis protocol initiated -Pneumonia and urine are both potential sources of infection -Blood and urine cultures pending -Will order lower respiratory tract procalcitonin level.  Antibiotics would not be indicated for PCT <0.1 and probably should not be used for < 0.25.  >0.5 indicates infection and >>0.5 indicates more serious disease.  As the procalcitonin level normalizes, it will be reasonable to consider de-escalation of antibiotic coverage. -Based on the severity of her multiple medical problems, I have requested that PCCM consult on this patient and they are in agreement that her risk of decompensation is great enough that admission to ICU is reasonable.  Acute encephalopathy -This may also be  multifactorial. -Sepsis may be playing a role here. -Additionally, she may have some uremia. -Finally, she has chronic pain syndrome and takes regular opiates (UDS also + for amphetamines); her husband acknowledges giving her additional medications last night.  She did respond in part to Narcan.  Unintentional overdose may also be a contributing factor.  Acute renal failure with an obstructing  stone -Patient has a 6 mm stone in the left UPJ with moderate left hydronephrosis -This is clearly a concern for her source of sepsis -I have requested that the EDP consult urology to assist with this situation  Anemia -Current Hgb 10.1, none recent but prior was 13.9 in 9/18 -Heme negative -Will trend  Chronic pain -I have reviewed this patient in the [place] Controlled Substances Reporting System.  She is receiving medications from multiple providers. -She is at high risk of opioid misuse, diversion, or overdose. -She takes Advil, MSIR, oxycodone, and Adderall XR at home.  Compression fractures -She has chronic compression fractures but also has one that is subacute. -She may benefit from Calcitonin nasal spray and/or lidocaine patches.  HTN -Hold Norvasc in the setting of hypotension.  Hypoalbuminemia -She may have more difficulty with wound healing as a result.  Mild diastolic dysfunction -Grade 1 DD with preserved EF in 10/17 -Appears to be compensated at this time  Thrombocytopenia -Will follow -No Lovenox   [facility] appreciates the opportunity to consult on this complicated patient.  We will be happy to assume care of the patient once she is stabilized and no longer in need of critical care services.    Total critical care time: 55 minutes Critical care time was exclusive of separately billable procedures and treating other patients. Critical care was necessary to treat or prevent imminent or life-threatening deterioration. Critical care was time spent personally by me on the  following activities: development of treatment plan with patient and/or surrogate as well as nursing, discussions with consultants, evaluation of patient's response to treatment, examination of patient, obtaining history from patient or surrogate, ordering and performing treatments and interventions, ordering and review of laboratory studies, ordering and review of radiographic studies, pulse oximetry and re-evaluation of patient's condition.    Jonah Blue MD [facility]  If note is complete, please contact covering daytime or nighttime physician. [email or web address] Password [email or web address]  08/21/2018, 4:21 PM

## 2018-08-21 NOTE — Consult Note (Signed)
I have been asked to see the patient by Dr. Jonah Blue, for evaluation and management of obstructing left ureteral stone in the setting of septic physiology and concern for urosepsis.  History of present illness: 72 year old female presented to the emergency department today with altered mental status.  She has had several falls over the past several days and was complaining of left-sided abdominal pain.  She was found unresponsive and EMS was called.  Her husband was present and relates a history of several days of decline.  Once EMS arrived she was found to be hypotensive, Narcan was given and she did have some response.  However, she remained confused with altered mental status.  In the emergency department she was found to have acute renal failure (creatinine 2.5 with baseline of 0.8).  Her lactate was 3.0.  She was afebrile, but hypotensive.  She was responsive to IV fluids.  Her white blood cell count was 11.3 K her platelet count was only 50.  CT scan was then obtained demonstrating a 6 mm left UPJ stone with diffuse anasarcous edema and hydroureteronephrosis.  She was also noted to have pneumonia.  Review of systems:At the time I interviewed the patient was alone and due to her mental status was unable to obtain a conference of review of systems.  Patient Active Problem List   Diagnosis Date Noted  . Obstruction of left ureteropelvic junction (UPJ) due to stone 08/21/2018  . Hypoalbuminemia 08/21/2018  . Chronic pain disorder 08/21/2018  . Thrombocytopenia (HCC) 08/21/2018  . Acute renal failure (ARF) (HCC) 08/21/2018  . Compression fracture of spine (HCC) 08/21/2018  . Acute respiratory failure with hypoxemia (HCC)   . Hematoma 06/03/2018  . Dysphagia 02/07/2018  . Generalized anxiety disorder 07/04/2017  . Nonintractable headache 07/04/2017  . Exertional dyspnea 08/05/2016  . Sepsis (HCC) 03/07/2015  . Hypokalemia 03/07/2015  . Acute encephalopathy 03/06/2015  . Fever  03/06/2015  . Chronic back pain 01/15/2015  . Obesity (BMI 30-39.9) 10/15/2013  . Pulmonary nodules 10/15/2013  . CAP (community acquired pneumonia) 10/02/2013  . Essential hypertension 08/24/2013  . Idiopathic scoliosis 04/19/2013  . Back pain 04/19/2013  . Osteoporosis 04/19/2013  . Edema 04/19/2013  . Mild diastolic dysfunction 01/01/2013  . Leg pain, bilateral 12/16/2012  . ADD (attention deficit disorder) 05/24/2012  . Involuntary movements 05/24/2012  . DIZZINESS 12/31/2009  . TREMOR, ESSENTIAL 12/12/2009  . PALPITATIONS, RECURRENT 12/12/2009  . OTHER VITAMIN B12 DEFICIENCY ANEMIA 10/30/2009  . FEMALE STRESS INCONTINENCE 10/28/2009  . MEMORY LOSS 10/28/2009  . Morbid obesity (HCC) 12/03/2008  . GERD 12/03/2008  . ANEMIA, MILD 06/26/2008  . DEPRESSION/ANXIETY 12/28/2007  . PICA 12/28/2007  . ACUTE BRONCHITIS 12/28/2007  . FATIGUE 12/28/2007  . Hyperlipidemia 05/15/2007  . ARTIFICIAL MENOPAUSE 05/15/2007  . Myalgia and myositis, unspecified 05/15/2007  . HEADACHE 05/15/2007  . CHEST PAIN, ATYPICAL 05/15/2007  . SYMPTOM, PAIN, ABDOMINAL, EPIGASTRIC 05/15/2007  . REDUCTION MAMMOPLASTY, HX OF 05/15/2007  . PSTPRC STATUS, BARIATRIC SURGERY 05/15/2007    No current facility-administered medications on file prior to encounter.    Current Outpatient Medications on File Prior to Encounter  Medication Sig Dispense Refill  . amitriptyline (ELAVIL) 75 MG tablet Take 75 mg by mouth at bedtime.    [name] amLODipine (NORVASC) 5 MG tablet Take 1 tablet (5 mg total) by mouth daily. 90 tablet 0  . amphetamine-dextroamphetamine (ADDERALL XR) 20 MG 24 hr capsule 2 po qam (Patient taking differently: Take 20 mg by mouth daily. ) 60 capsule  0  . baclofen (LIORESAL) 10 MG tablet Take 10 mg by mouth 3 (three) times daily.    . clonazePAM (KLONOPIN) 0.5 MG tablet Take 0.5 tablets (0.25 mg total) by mouth daily as needed for anxiety. 30 tablet 0  . Cyanocobalamin (VITAMIN B-12) 1000 MCG SUBL  Place 10,000 mcg under the tongue daily at 12 noon.    [name] ibuprofen (ADVIL,MOTRIN) 200 MG tablet Take 200 mg by mouth as needed.    [name] morphine (MSIR) 15 MG tablet Take 15 mg by mouth every 12 (twelve) hours as needed for severe pain (for pain).    . Oxycodone HCl 20 MG TABS Take 1 tablet by mouth 4 (four) times daily as needed (pain).     . SUMAtriptan (IMITREX) 50 MG tablet Take 1 tablet (50 mg total) by mouth every 2 (two) hours as needed for migraine. May repeat in 2 hours if headache persists or recurs. 10 tablet 1  . venlafaxine XR (EFFEXOR-XR) 75 MG 24 hr capsule Take 3 capsules (225 mg total) by mouth daily. 270 capsule 3  . doxycycline (VIBRA-TABS) 100 MG tablet Take 1 tablet (100 mg total) by mouth 2 (two) times daily. (Patient not taking: Reported on 08/21/2018) 20 tablet 0  . omeprazole (PRILOSEC) 20 MG capsule Take 1 capsule (20 mg total) by mouth daily. (Patient not taking: Reported on 08/21/2018) 30 capsule 3    Past Medical History:  Diagnosis Date  . ADD (attention deficit disorder) 05/24/2012  . ANEMIA, MILD 06/26/2008   Qualifier: Diagnosis of  By: Janit Bern    . Anxiety   . Arthritis    "joints, back" (10/02/2013)  . Chronic back pain    "all over my back" (10/02/2013)  . Congestive heart disease (HCC)    "I have the beginning signs" (10/02/2013)  . Crushing injury of arm, right 02/06/1989's   "it was crushed; wore cast from fingers to top of my shoulder" (11/25/2  . Crushing injury of left wrist 05/11/1989's  . DIZZINESS 12/31/2009   Qualifier: Diagnosis of  By: Oswald Hillock    . Fibromyalgia   . GERD 12/03/2008   Qualifier: Diagnosis of  By: Freddy Jaksch    . HYPERLIPIDEMIA 05/15/2007   Qualifier: Diagnosis of  By: Janit Bern ; pt denies this hx on 10/02/2013  . Hypertension   . Idiopathic scoliosis 04/19/2013  . Migraines    "once/month" (10/02/2013)  . Osteoporosis, unspecified 04/19/2013  . Other vitamin B12 deficiency anemia 10/30/2009    Qualifier: Diagnosis of  By: Floydene Flock    . PALPITATIONS, RECURRENT 12/12/2009   Qualifier: Diagnosis of  By: Janit Bern    . Pica 12/28/2007   Qualifier: Diagnosis of  By: Janit Bern    . Pneumonia 10/02/2013   "had onsets of it before; not full blown til now" (10/02/2013)  . PSTPRC STATUS, BARIATRIC SURGERY 05/15/2007   Qualifier: Diagnosis of  By: Janit Bern    . Pulmonary nodules/lesions, multiple   . TREMOR, ESSENTIAL 12/12/2009   Qualifier: Diagnosis of  By: Janit Bern      Past Surgical History:  Procedure Laterality Date  . ABDOMINAL HYSTERECTOMY  1980's  . APPENDECTOMY  1980's   "w/either my hysterectomy or myomectomy" (10/02/2013)  . BACK SURGERY    . CLOSED REDUCTION HAND FRACTURE Right 1990's   "it was crushed; wore cast from fingers to top of my shoulder" (10/02/2013)  . GASTRIC BYPASS  ~ 2003  . KNEE ARTHROSCOPY  Right 2002  . MYOMECTOMY  1980's  . REDUCTION MAMMAPLASTY Bilateral 2001  . REFRACTIVE SURGERY Bilateral 2004  . SPINAL CORD STIMULATOR IMPLANT    . TONSILLECTOMY AND ADENOIDECTOMY  1955  . TUBAL LIGATION  1980's    Social History   Tobacco Use  . Smoking status: Never Smoker  . Smokeless tobacco: Never Used  Substance Use Topics  . Alcohol use: No  . Drug use: No    Family History  Adopted: Yes  Problem Relation Age of Onset  . COPD Mother   . Emphysema Mother   . Heart attack Mother   . Other Mother        tobacco abuse    PE: Vitals:   08/21/18 1845 08/21/18 1900 08/21/18 1915 08/21/18 1931  BP: (!) 87/54 92/65 (!) 89/63   Pulse: (!) 113 (!) 104 (!) 103   Resp: (!) 24 (!) 22 19   Temp:    (!) 97.3 F (36.3 C)  TempSrc:    Axillary  SpO2: 95% 96% 100%   Weight:      Height:       Patient appears on a nonrebreather, arousable, incomprehensible patient is completely disoriented Atraumatic normocephalic head No cervical or supraclavicular lymphadenopathy appreciated Expiratory wheezes Regular sinus  rhythm/tachycardic Abdomen is soft, nontender, nondistended, patient winces with left abdominal palpation Lower extremities are symmetric without appreciable edema Grossly neurologically intact No identifiable skin lesions  Recent Labs    08/21/18 1057  WBC 11.3*  HGB 10.1*  HCT 32.0*   Recent Labs    08/21/18 1057  NA 134*  K 3.3*  CL 102  CO2 21*  GLUCOSE 93  BUN 53*  CREATININE 2.92*  CALCIUM 7.1*   Recent Labs    08/21/18 1815  INR 1.87   No results for input(s): LABURIN in the last 72 hours. Results for orders placed or performed during the hospital encounter of 08/21/18  MRSA PCR Screening     Status: Abnormal   Collection Time: 08/21/18  5:53 PM  Result Value Ref Range Status   MRSA by PCR POSITIVE (A) NEGATIVE Final    Comment:        The GeneXpert MRSA Assay (FDA approved for NASAL specimens only), is one component of a comprehensive MRSA colonization surveillance program. It is not intended to diagnose MRSA infection nor to guide or monitor treatment for MRSA infections. RESULT CALLED TO, READ BACK BY AND VERIFIED WITH: Tommie Sams RN 08/21/18 1936 JDW Performed at [facility], [address], [place], [place] [address]     Imaging: I have independently reviewed the patient's CT scan demonstrating 6 mm left UPJ stone with proximal hydronephrosis.  There is edema around the kidney as well as diffusely within the bowel and abdominal wall.  She also has air within her bladder.  Imp: This patient likely has bacterial sepsis, with the likely explanation being in a urinary tract source secondary to an obstructing stone.  She also has some air within her bladder raising concern that she has emphysematous cystitis.  She is hypotensive with a elevated lactate.  She also was on a nonrebreather with concern of pneumonia.  Her mental status is altered secondary to her infections.   Recommendations: I recommended inserting a Foley catheter the patient allowing  the urine to more freely drain especially in light of air seen within her bladder.  She has a past history of enterococcus urinary tract infections, which would fit with her presentation  today and her lab values.  Interventional radiology is planning to place a percutaneous nephrostomy tube in the left kidney to decompress her kidney and drain her infection for better source control.  This will be complicated somewhat by her thrombocytopenia as well as the spinal stimulator.  However, general anesthesia is a significant risk for this patient and as such retrograde stent placement would be with increased risk of prolonged intubation and/or severe anesthetic complications including death.  Once the patients has been drained she will need more definitive treatment of the stone, after infection is completely clear.  This will be managed as an outpatient.  Thank you for involving me in this patient's care, I will continue to follow along.  Berniece Salines W

## 2018-08-21 NOTE — Progress Notes (Signed)
Pharmacy Antibiotic Note  [name] is a 72 y.o. female admitted on 08/21/2018 with sepsis.  Pharmacy has been consulted for Zosyn dosing. Pt experiencing acute metabolic encephalopathy d/t sepsis and acute renal failure; infection likely of urinary tract source. Afebrile, WBC - 11.3, lactic acid - 2.35; Scr-2.92  Plan: Zosyn 2.25g q6h Monitor and adjust abx pending C&S Pt received one dose of azithromycin and one dose of rocephin in ED    Temp (24hrs), Avg:97.8 F (36.6 C), Min:97.8 F (36.6 C), Max:97.8 F (36.6 C)  Recent Labs  Lab 08/21/18 1057 08/21/18 1139 08/21/18 1254  WBC 11.3*  --   --   CREATININE 2.92*  --   --   LATICACIDVEN  --  3.09* 2.35*    CrCl cannot be calculated (Unknown ideal weight.).    No Known Allergies  Antimicrobials this admission: Azithromycin 10/14 x1 Ceftriaxone 10/14 x1  Zosyn 10/14 >>  Dose adjustments this admission: N/A  Microbiology results: Pending  Thank you for allowing pharmacy to be a part of this patient's care.  Koleen Nimrod 08/21/2018 5:51 PM

## 2018-08-21 NOTE — ED Notes (Signed)
Patient very anxious states she needs her pain meds.

## 2018-08-21 NOTE — Progress Notes (Signed)
eLink Physician-Brief Progress Note Patient Name: [name] DOB: 08/23/1946 MRN: [id number]   Date of Service  08/21/2018  HPI/Events of Note  Called by IR who relates that they are unable to do percutaneous nephrostomy d/t INR = 1.87 and Platelet count = 50K. IR requests improvement of coagulation parameters and transfusion of platelets prior to OR.   eICU Interventions  Will order: 1. Transfuse 3 units of FFP now.  2. Repeat INR at 1 AM or after FFP infused.  3. One unit single donor platelets prior to procedure at 7 AM.      Intervention Category Intermediate Interventions: Coagulopathy - evaluation and management  Sommer,Steven Eugene 08/21/2018, 7:43 PM

## 2018-08-21 NOTE — Progress Notes (Signed)
CRITICAL VALUE ALERT  Critical Value: Troponin 0.05  Date & Time Notified:  08/21/2018 2000  Provider Notified: Elita Boone, RN  Orders Received/Actions taken: Awaiting new orders, will continue to monitor closely. Caswell Corwin, RN 08/21/18  8:01 PM

## 2018-08-21 NOTE — ED Notes (Signed)
  Transported to Ct 

## 2018-08-21 NOTE — Progress Notes (Signed)
CRITICAL VALUE ALERT  Critical Value:  Lactic acid 3.5  Date & Time Notified:  08/21/2018 1950  Provider Notified: Elita Boone, RN  Orders Received/Actions taken: Awaiting new orders. Will continue to monitor. Caswell Corwin, RN 08/21/18 7:56 PM

## 2018-08-21 NOTE — Progress Notes (Signed)
Orthopedic Tech Progress Note Patient Details:  [name] 24-Dec-1945 [id number]  Patient ID: [name], female   DOB: July 25, 1946, 72 y.o.   MRN: [id number]   [name] 08/21/2018, 5:33 PMCalled [employer] for Lumbar corsett

## 2018-08-21 NOTE — Consult Note (Signed)
Consulted for obstructing stone.  Patient with septic physiology.  Discussed treatment options with ED staff, given hypotension, pneumonia and fragility, they felt she would not tolerate general anesthesia.  I concur, and would recommend a left percutaneous neph tube be placed.  Complicating factor would be the neuro stimulator in her left flank.    I will f/u with patient this later this afternoon.

## 2018-08-21 NOTE — Progress Notes (Signed)
eLink Physician-Brief Progress Note Patient Name: [name] DOB: 03-Sep-1946 MRN: [id number]   Date of Service  08/21/2018  HPI/Events of Note  Episodes of SVT with ventricular rate up to 150's. K+ = 3.4, Mg++ = 1.6 and Creatinine = 2.49   eICU Interventions  Will order: 1. Replace K+ and Mg++.      Intervention Category Major Interventions: Arrhythmia - evaluation and management  Tarena Gockley Eugene 08/21/2018, 11:28 PM

## 2018-08-21 NOTE — ED Notes (Signed)
Patient has sat at 81% on 3 liters/[place]. Patient placed on NRB sats increased to 99%. Patient more alert after Narcan, c/o increased pain , however b/p  Increased.

## 2018-08-21 NOTE — Progress Notes (Signed)
Patient ID: [name], female   DOB: 30-Mar-1946, 72 y.o.   MRN: [id number]    Chart, imaging, notes, and labs reviewed.  Pt is now in the ICU with improving vitals following hydration and IV abx.  Currently not on pressors.  Current lab issues include plts 50 and INR 1.87  Left pcn may be difficult because of overlying spinal stimulator in the left flank making needle access challenging.  Access may require multiple passes from different positions to avoid the stimulator.    Therefore, will plan for correction of coagulopathy overnight with FFP and 1 unit of plts 1 hour prior(around 7am) to the proedure.  IR staff and doc for tomorrow are aware of the plans and agree.  D/w Dr Dellie Catholic who agrees as well

## 2018-08-22 ENCOUNTER — Other Ambulatory Visit: Payer: Self-pay

## 2018-08-22 ENCOUNTER — Inpatient Hospital Stay (HOSPITAL_COMMUNITY): Payer: Medicare Other

## 2018-08-22 ENCOUNTER — Encounter (HOSPITAL_COMMUNITY): Payer: Self-pay | Admitting: Interventional Radiology

## 2018-08-22 DIAGNOSIS — N179 Acute kidney failure, unspecified: Secondary | ICD-10-CM

## 2018-08-22 DIAGNOSIS — L8990 Pressure ulcer of unspecified site, unspecified stage: Secondary | ICD-10-CM

## 2018-08-22 DIAGNOSIS — G9340 Encephalopathy, unspecified: Secondary | ICD-10-CM

## 2018-08-22 HISTORY — PX: IR NEPHROSTOMY PLACEMENT LEFT: IMG6063

## 2018-08-22 LAB — GLUCOSE, CAPILLARY
GLUCOSE-CAPILLARY: 60 mg/dL — AB (ref 70–99)
GLUCOSE-CAPILLARY: 26 mg/dL — AB (ref 70–99)
Glucose-Capillary: 106 mg/dL — ABNORMAL HIGH (ref 70–99)
Glucose-Capillary: 149 mg/dL — ABNORMAL HIGH (ref 70–99)
Glucose-Capillary: 34 mg/dL — CL (ref 70–99)
Glucose-Capillary: 69 mg/dL — ABNORMAL LOW (ref 70–99)
Glucose-Capillary: 71 mg/dL (ref 70–99)
Glucose-Capillary: 79 mg/dL (ref 70–99)

## 2018-08-22 LAB — CBC
HCT: 30.3 % — ABNORMAL LOW (ref 36.0–46.0)
HEMATOCRIT: 33.4 % — AB (ref 36.0–46.0)
HEMOGLOBIN: 10.4 g/dL — AB (ref 12.0–15.0)
Hemoglobin: 9.9 g/dL — ABNORMAL LOW (ref 12.0–15.0)
MCH: 30.3 pg (ref 26.0–34.0)
MCH: 29.5 pg (ref 26.0–34.0)
MCHC: 32.7 g/dL (ref 30.0–36.0)
MCHC: 31.1 g/dL (ref 30.0–36.0)
MCV: 92.7 fL (ref 80.0–100.0)
MCV: 94.6 fL (ref 80.0–100.0)
NRBC: 0.2 % (ref 0.0–0.2)
NRBC: 0.5 % — AB (ref 0.0–0.2)
PLATELETS: 33 10*3/uL — AB (ref 150–400)
Platelets: UNDETERMINED 10*3/uL (ref 150–400)
RBC: 3.27 MIL/uL — AB (ref 3.87–5.11)
RBC: 3.53 MIL/uL — AB (ref 3.87–5.11)
RDW: 13.2 % (ref 11.5–15.5)
RDW: 13.2 % (ref 11.5–15.5)
WBC: 10.2 10*3/uL (ref 4.0–10.5)
WBC: 3.7 10*3/uL — ABNORMAL LOW (ref 4.0–10.5)

## 2018-08-22 LAB — BLOOD CULTURE ID PANEL (REFLEXED)
Acinetobacter baumannii: NOT DETECTED
CANDIDA GLABRATA: NOT DETECTED
CANDIDA KRUSEI: NOT DETECTED
CANDIDA PARAPSILOSIS: NOT DETECTED
CANDIDA TROPICALIS: NOT DETECTED
Candida albicans: NOT DETECTED
Carbapenem resistance: NOT DETECTED
ESCHERICHIA COLI: DETECTED — AB
Enterobacter cloacae complex: NOT DETECTED
Enterobacteriaceae species: DETECTED — AB
Enterococcus species: NOT DETECTED
Haemophilus influenzae: NOT DETECTED
KLEBSIELLA OXYTOCA: NOT DETECTED
KLEBSIELLA PNEUMONIAE: NOT DETECTED
LISTERIA MONOCYTOGENES: NOT DETECTED
Methicillin resistance: NOT DETECTED
NEISSERIA MENINGITIDIS: NOT DETECTED
PROTEUS SPECIES: NOT DETECTED
Pseudomonas aeruginosa: NOT DETECTED
STREPTOCOCCUS SPECIES: NOT DETECTED
Serratia marcescens: NOT DETECTED
Staphylococcus aureus (BCID): NOT DETECTED
Staphylococcus species: NOT DETECTED
Streptococcus agalactiae: NOT DETECTED
Streptococcus pneumoniae: NOT DETECTED
Streptococcus pyogenes: NOT DETECTED
Vancomycin resistance: NOT DETECTED

## 2018-08-22 LAB — RENAL FUNCTION PANEL
Albumin: 2.2 g/dL — ABNORMAL LOW (ref 3.5–5.0)
Anion gap: 11 (ref 5–15)
BUN: 51 mg/dL — ABNORMAL HIGH (ref 8–23)
CALCIUM: 7.5 mg/dL — AB (ref 8.9–10.3)
CO2: 21 mmol/L — ABNORMAL LOW (ref 22–32)
Chloride: 106 mmol/L (ref 98–111)
Creatinine, Ser: 2.46 mg/dL — ABNORMAL HIGH (ref 0.44–1.00)
GFR, EST AFRICAN AMERICAN: 22 mL/min — AB (ref >60)
GFR, EST NON AFRICAN AMERICAN: 19 mL/min — AB (ref >60)
Glucose, Bld: 108 mg/dL — ABNORMAL HIGH (ref 70–99)
Phosphorus: 3.8 mg/dL (ref 2.5–4.6)
Potassium: 3.1 mmol/L — ABNORMAL LOW (ref 3.5–5.1)
SODIUM: 138 mmol/L (ref 135–145)

## 2018-08-22 LAB — BASIC METABOLIC PANEL
ANION GAP: 11 (ref 5–15)
BUN: 51 mg/dL — ABNORMAL HIGH (ref 8–23)
CHLORIDE: 111 mmol/L (ref 98–111)
CO2: 17 mmol/L — AB (ref 22–32)
CREATININE: 2.36 mg/dL — AB (ref 0.44–1.00)
Calcium: 7.9 mg/dL — ABNORMAL LOW (ref 8.9–10.3)
GFR calc non Af Amer: 20 mL/min — ABNORMAL LOW (ref >60)
GFR, EST AFRICAN AMERICAN: 23 mL/min — AB (ref >60)
Glucose, Bld: 99 mg/dL (ref 70–99)
Potassium: 3 mmol/L — ABNORMAL LOW (ref 3.5–5.1)
SODIUM: 139 mmol/L (ref 135–145)

## 2018-08-22 LAB — PROCALCITONIN: Procalcitonin: 46.38 ng/mL

## 2018-08-22 LAB — PROTIME-INR
INR: 1.8
PROTHROMBIN TIME: 20.7 s — AB (ref 11.4–15.2)

## 2018-08-22 LAB — TROPONIN I
TROPONIN I: 0.04 ng/mL — AB (ref <0.03)
Troponin I: 0.05 ng/mL (ref <0.03)

## 2018-08-22 LAB — HIV ANTIBODY (ROUTINE TESTING W REFLEX): HIV Screen 4th Generation wRfx: NONREACTIVE

## 2018-08-22 MED ORDER — LIDOCAINE HCL 1 % IJ SOLN
INTRAMUSCULAR | Status: AC | PRN
  Administered 2018-08-22: 5 mL

## 2018-08-22 MED ORDER — PIPERACILLIN-TAZOBACTAM 3.375 G IVPB
3.3750 g | Freq: Three times a day (TID) | INTRAVENOUS | Status: DC
  Administered 2018-08-22 – 2018-08-23 (×2): 3.375 g via INTRAVENOUS
  Filled 2018-08-22 (×4): qty 50

## 2018-08-22 MED ORDER — MIDAZOLAM HCL 2 MG/2ML IJ SOLN
INTRAMUSCULAR | Status: AC
  Filled 2018-08-22: qty 2

## 2018-08-22 MED ORDER — VANCOMYCIN HCL 10 G IV SOLR
1250.0000 mg | Freq: Once | INTRAVENOUS | Status: AC
  Administered 2018-08-22: 1250 mg via INTRAVENOUS
  Filled 2018-08-22: qty 1250

## 2018-08-22 MED ORDER — OXYCODONE HCL 5 MG PO TABS
5.0000 mg | ORAL_TABLET | Freq: Four times a day (QID) | ORAL | Status: DC | PRN
  Administered 2018-08-25 – 2018-08-28 (×9): 5 mg via ORAL
  Filled 2018-08-22 (×9): qty 1

## 2018-08-22 MED ORDER — POTASSIUM CHLORIDE 10 MEQ/100ML IV SOLN
10.0000 meq | INTRAVENOUS | Status: AC
  Administered 2018-08-22 (×4): 10 meq via INTRAVENOUS
  Filled 2018-08-22 (×4): qty 100

## 2018-08-22 MED ORDER — DEXTROSE 50 % IV SOLN
INTRAVENOUS | Status: AC
  Administered 2018-08-22: 50 mL
  Filled 2018-08-22: qty 50

## 2018-08-22 MED ORDER — SODIUM CHLORIDE 0.9% FLUSH
5.0000 mL | Freq: Two times a day (BID) | INTRAVENOUS | Status: DC
  Administered 2018-08-22: 5 mL
  Administered 2018-08-22: 3 mL
  Administered 2018-08-23: 5 mL
  Administered 2018-08-23: 3 mL
  Administered 2018-08-24 – 2018-08-27 (×8): 5 mL
  Administered 2018-08-28: 3 mL
  Administered 2018-08-28 – 2018-08-30 (×3): 5 mL
  Administered 2018-08-30: 3 mL
  Administered 2018-08-31 – 2018-09-01 (×3): 5 mL

## 2018-08-22 MED ORDER — FENTANYL CITRATE (PF) 100 MCG/2ML IJ SOLN
INTRAMUSCULAR | Status: AC
  Filled 2018-08-22: qty 2

## 2018-08-22 MED ORDER — DEXTROSE 10 % IV SOLN
INTRAVENOUS | Status: DC
  Administered 2018-08-22 – 2018-08-25 (×6): via INTRAVENOUS
  Administered 2018-08-26: 50 mL/h via INTRAVENOUS
  Administered 2018-08-27: 06:00:00 via INTRAVENOUS

## 2018-08-22 MED ORDER — IOPAMIDOL (ISOVUE-300) INJECTION 61%
INTRAVENOUS | Status: AC
  Administered 2018-08-22: 10 mL
  Filled 2018-08-22: qty 50

## 2018-08-22 MED ORDER — DEXTROSE 50 % IV SOLN: 50.0000 mL | Freq: Once | INTRAVENOUS | Status: AC

## 2018-08-22 MED ORDER — MIDAZOLAM HCL 2 MG/2ML IJ SOLN
INTRAMUSCULAR | Status: AC | PRN
  Administered 2018-08-22: 0.5 mg via INTRAVENOUS

## 2018-08-22 MED ORDER — SODIUM CHLORIDE 0.9 % IV BOLUS
250.0000 mL | Freq: Once | INTRAVENOUS | Status: AC
  Administered 2018-08-22: 250 mL via INTRAVENOUS

## 2018-08-22 MED ORDER — FENTANYL CITRATE (PF) 100 MCG/2ML IJ SOLN
25.0000 ug | INTRAMUSCULAR | Status: DC | PRN
  Administered 2018-08-22: 50 ug via INTRAVENOUS
  Administered 2018-08-23: 25 ug via INTRAVENOUS
  Administered 2018-08-24: 50 ug via INTRAVENOUS
  Administered 2018-08-24 (×3): 25 ug via INTRAVENOUS
  Administered 2018-08-25 (×2): 50 ug via INTRAVENOUS
  Filled 2018-08-22 (×8): qty 2

## 2018-08-22 MED ORDER — LIDOCAINE HCL 1 % IJ SOLN
INTRAMUSCULAR | Status: AC
  Filled 2018-08-22: qty 20

## 2018-08-22 NOTE — Progress Notes (Signed)
Hypoglycemic Event  CBG: 60  Treatment: D50 IV 25 mL  Symptoms: None  Follow-up CBG: Time:04:41 CBG Result:149  Possible Reasons for Event: Inadequate meal intake and Unknown  Comments/MD notified:[facility] Dr. Arsenio Loader notified. Increased IVF rate from 63mL/hr to 152mL/hr. Will continue to monitor closely.  Caswell Corwin  08/22/18 5:46 AM

## 2018-08-22 NOTE — Consult Note (Signed)
Chief Complaint: Patient was seen in consultation today for obstructing stone  Referring Physician(s): Dr. Marlou Porch  Supervising Physician: Irish Lack  Patient Status: [facility] - In-pt  History of Present Illness: [name] is a 72 y.o. female with past medical history of ADD, CHF, GERD, fibromyalgia, HTN who presented to [facility] ED with AMS.  Patient found to have an obstructing stone.  She has been evaluated by Urology who recommends against the OR at this time and has consulted IR for percutaneous nephrostomy tube placement.   Patient has been NPO.  Her platelets were low last evening, INR was 1.9.  Decision was made to give FFP and platelets to minimize risk with the procedure.  Patient remained stable overnight.  Past Medical History:  Diagnosis Date  . ADD (attention deficit disorder) 05/24/2012  . ANEMIA, MILD 06/26/2008   Qualifier: Diagnosis of  By: Janit Bern    . Anxiety   . Arthritis    "joints, back" (10/02/2013)  . Chronic back pain    "all over my back" (10/02/2013)  . Congestive heart disease (HCC)    "I have the beginning signs" (10/02/2013)  . Crushing injury of arm, right 02/06/1989's   "it was crushed; wore cast from fingers to top of my shoulder" (11/25/2  . Crushing injury of left wrist 05/11/1989's  . DIZZINESS 12/31/2009   Qualifier: Diagnosis of  By: Oswald Hillock    . Fibromyalgia   . GERD 12/03/2008   Qualifier: Diagnosis of  By: Freddy Jaksch    . HYPERLIPIDEMIA 05/15/2007   Qualifier: Diagnosis of  By: Janit Bern ; pt denies this hx on 10/02/2013  . Hypertension   . Idiopathic scoliosis 04/19/2013  . Migraines    "once/month" (10/02/2013)  . Osteoporosis, unspecified 04/19/2013  . Other vitamin B12 deficiency anemia 10/30/2009   Qualifier: Diagnosis of  By: Floydene Flock    . PALPITATIONS, RECURRENT 12/12/2009   Qualifier: Diagnosis of  By: Janit Bern    . Pica 12/28/2007   Qualifier: Diagnosis of  By: Janit Bern      . Pneumonia 10/02/2013   "had onsets of it before; not full blown til now" (10/02/2013)  . PSTPRC STATUS, BARIATRIC SURGERY 05/15/2007   Qualifier: Diagnosis of  By: Janit Bern    . Pulmonary nodules/lesions, multiple   . TREMOR, ESSENTIAL 12/12/2009   Qualifier: Diagnosis of  By: Janit Bern      Past Surgical History:  Procedure Laterality Date  . ABDOMINAL HYSTERECTOMY  1980's  . APPENDECTOMY  1980's   "w/either my hysterectomy or myomectomy" (10/02/2013)  . BACK SURGERY    . CLOSED REDUCTION HAND FRACTURE Right 1990's   "it was crushed; wore cast from fingers to top of my shoulder" (10/02/2013)  . GASTRIC BYPASS  ~ 2003  . KNEE ARTHROSCOPY Right 2002  . MYOMECTOMY  1980's  . REDUCTION MAMMAPLASTY Bilateral 2001  . REFRACTIVE SURGERY Bilateral 2004  . SPINAL CORD STIMULATOR IMPLANT    . TONSILLECTOMY AND ADENOIDECTOMY  1955  . TUBAL LIGATION  1980's    Allergies: Patient has no known allergies.  Medications: Prior to Admission medications   Medication Sig Start Date End Date Taking? Authorizing Provider  amitriptyline (ELAVIL) 75 MG tablet Take 75 mg by mouth at bedtime. 08/10/18  Yes [provider]  amLODipine (NORVASC) 5 MG tablet Take 1 tablet (5 mg total) by mouth daily. 08/10/18  Yes Lars Masson, MD  amphetamine-dextroamphetamine (ADDERALL XR) 20  MG 24 hr capsule 2 po qam Patient taking differently: Take 20 mg by mouth daily.  08/11/18  Yes Wendling, Jilda Roche, DO  baclofen (LIORESAL) 10 MG tablet Take 10 mg by mouth 3 (three) times daily. 06/12/18  Yes [provider]  clonazePAM (KLONOPIN) 0.5 MG tablet Take 0.5 tablets (0.25 mg total) by mouth daily as needed for anxiety. 08/11/18  Yes Wendling, Jilda Roche, DO  Cyanocobalamin (VITAMIN B-12) 1000 MCG SUBL Place 10,000 mcg under the tongue daily at 12 noon.   Yes [provider]  ibuprofen (ADVIL,MOTRIN) 200 MG tablet Take 200 mg by mouth as needed.   Yes [provider]  morphine (MSIR) 15 MG tablet Take 15 mg by mouth every 12 (twelve) hours as needed for severe pain (for pain).   Yes [provider]  Oxycodone HCl 20 MG TABS Take 1 tablet by mouth 4 (four) times daily as needed (pain).  03/06/15  Yes [provider]  SUMAtriptan (IMITREX) 50 MG tablet Take 1 tablet (50 mg total) by mouth every 2 (two) hours as needed for migraine. May repeat in 2 hours if headache persists or recurs. 04/12/18  Yes [name] R, DO  venlafaxine XR (EFFEXOR-XR) 75 MG 24 hr capsule Take 3 capsules (225 mg total) by mouth daily. 02/07/18  Yes Donato Schultz, DO  doxycycline (VIBRA-TABS) 100 MG tablet Take 1 tablet (100 mg total) by mouth 2 (two) times daily. Patient not taking: Reported on 08/21/2018 07/13/18   Zola Button, Grayling Congress, DO  omeprazole (PRILOSEC) 20 MG capsule Take 1 capsule (20 mg total) by mouth daily. Patient not taking: Reported on 08/21/2018 02/07/18   Donato Schultz, DO     Family History  Adopted: Yes  Problem Relation Age of Onset  . COPD Mother   . Emphysema Mother   . Heart attack Mother   . Other Mother        tobacco abuse    Social History   Socioeconomic History  . Marital status: Married    Spouse name: Not on file  . Number of children: 2  . Years of education: Not on file  . Highest education level: Not on file  Occupational History  . Occupation: Retired  Engineer, production  . Financial resource strain: Not on file  . Food insecurity:    Worry: Not on file    Inability: Not on file  . Transportation needs:    Medical: Not on file    Non-medical: Not on file  Tobacco Use  . Smoking status: Never Smoker  . Smokeless tobacco: Never Used  Substance and Sexual Activity  . Alcohol use: No  . Drug use: No  . Sexual activity: Never    Partners: Male  Lifestyle  . Physical activity:    Days per week: Not on file    Minutes per session: Not on file  . Stress: Not on file  Relationships    . Social connections:    Talks on phone: Not on file    Gets together: Not on file    Attends religious service: Not on file    Active member of club or organization: Not on file    Attends meetings of clubs or organizations: Not on file    Relationship status: Not on file  Other Topics Concern  . Not on file  Social History Narrative  . Not on file     Review of Systems: A 12 point  ROS discussed and pertinent positives are indicated in the HPI above.  All other systems are negative.  Review of Systems  Unable to perform ROS: Acuity of condition    Vital Signs: BP 99/62   Pulse 100   Temp (!) 97.3 F (36.3 C) (Oral)   Resp 18   Ht 5\' 5"  (1.651 m)   Wt 145 lb 8.1 oz (66 kg)   SpO2 99%   BMI 24.21 kg/m   Physical Exam  Constitutional: She appears well-developed. No distress.  Neck: Normal range of motion. Neck supple.  Cardiovascular: Normal rate and normal heart sounds. Exam reveals no gallop and no friction rub.  No murmur heard. Sinus arrythmia  Pulmonary/Chest: Effort normal and breath sounds normal. No respiratory distress.  Abdominal: Soft. She exhibits no distension. There is tenderness.  Neurological: She is alert.  Skin: Skin is warm and dry. She is not diaphoretic.  Nursing note and vitals reviewed.    MD Evaluation Airway: WNL Heart: WNL Abdomen: WNL Chest/ Lungs: WNL ASA  Classification: 3 Mallampati/Airway Score: One   Imaging: Ct Abdomen Pelvis Wo Contrast  Result Date: 08/21/2018 CLINICAL DATA:  Fall yesterday.  Abdominal pain. EXAM: CT ABDOMEN AND PELVIS WITHOUT CONTRAST TECHNIQUE: Multidetector CT imaging of the abdomen and pelvis was performed following the standard protocol without IV contrast. COMPARISON:  07/17/2017. FINDINGS: Lower chest: Bibasilar dependent atelectasis. Heart size normal. No pericardial or pleural effusion. Hepatobiliary: Liver is grossly unremarkable. Gallbladder is distended. No definite biliary ductal dilatation.  Pancreas: Grossly unremarkable. Spleen: Negative. Adrenals/Urinary Tract: Adrenal glands and right kidney are grossly unremarkable. Moderate left hydronephrosis secondary to a 6 mm stone at the left ureteropelvic junction. Air is seen in the bladder and is presumably iatrogenic. Stomach/Bowel: Postoperative changes involving the stomach. Stomach and small bowel are otherwise grossly unremarkable. Fair amount of stool is seen in the colon, indicative of constipation. Vascular/Lymphatic: Atherosclerotic calcification of the arterial vasculature without abdominal aortic aneurysm. No definite pathologically enlarged lymph nodes. Reproductive: No adnexal mass. Other: No free fluid. Mesenteries and peritoneum are grossly unremarkable. Musculoskeletal: Degenerative changes in the spine. No worrisome lytic or sclerotic lesions. Spinal stimulator wires are seen in the lower thoracic spine. Thoracolumbar compression deformities with L1 vertebral body augmentation. IMPRESSION: 1. Moderate left hydronephrosis secondary to a 6 mm stone at the left ureteropelvic junction. 2. Stool throughout the colon is indicative of constipation. 3. Gallbladder is distended. Please correlate for right upper quadrant symptoms. 4.  Aortic atherosclerosis (ICD10-170.0). Electronically Signed   By: Leanna Battles M.D.   On: 08/21/2018 14:22   Ct Head Wo Contrast  Result Date: 08/21/2018 CLINICAL DATA:  Recent fall with head injury, initial encounter EXAM: CT HEAD WITHOUT CONTRAST CT CERVICAL SPINE WITHOUT CONTRAST TECHNIQUE: Multidetector CT imaging of the head and cervical spine was performed following the standard protocol without intravenous contrast. Multiplanar CT image reconstructions of the cervical spine were also generated. COMPARISON:  None. FINDINGS: CT HEAD FINDINGS Brain: Mild atrophic changes are noted. No findings to suggest acute hemorrhage, acute infarction or space-occupying mass lesion are seen. Mild chronic white matter  ischemic changes are noted as well. Vascular: No hyperdense vessel or unexpected calcification. Skull: Normal. Negative for fracture or focal lesion. Sinuses/Orbits: No acute finding. Other: None. CT CERVICAL SPINE FINDINGS Alignment: Alignment is well maintained. Skull base and vertebrae: 7 cervical segments are well visualized. Vertebral body height is well maintained. Multilevel facet hypertrophic changes are noted. No acute fracture or acute facet abnormality is seen. Multilevel  neural foraminal changes are noted. Soft tissues and spinal canal: Surrounding soft tissues are within normal limits. Upper chest: Scarring is noted in the left apex. No other focal abnormality is noted. Other: None IMPRESSION: CT of the head: Chronic atrophic and ischemic changes without acute abnormality. CT of the cervical spine: Multilevel degenerative change without acute abnormality. Electronically Signed   By: Alcide Clever M.D.   On: 08/21/2018 12:25   Ct Cervical Spine Wo Contrast  Result Date: 08/21/2018 CLINICAL DATA:  Recent fall with head injury, initial encounter EXAM: CT HEAD WITHOUT CONTRAST CT CERVICAL SPINE WITHOUT CONTRAST TECHNIQUE: Multidetector CT imaging of the head and cervical spine was performed following the standard protocol without intravenous contrast. Multiplanar CT image reconstructions of the cervical spine were also generated. COMPARISON:  None. FINDINGS: CT HEAD FINDINGS Brain: Mild atrophic changes are noted. No findings to suggest acute hemorrhage, acute infarction or space-occupying mass lesion are seen. Mild chronic white matter ischemic changes are noted as well. Vascular: No hyperdense vessel or unexpected calcification. Skull: Normal. Negative for fracture or focal lesion. Sinuses/Orbits: No acute finding. Other: None. CT CERVICAL SPINE FINDINGS Alignment: Alignment is well maintained. Skull base and vertebrae: 7 cervical segments are well visualized. Vertebral body height is well maintained.  Multilevel facet hypertrophic changes are noted. No acute fracture or acute facet abnormality is seen. Multilevel neural foraminal changes are noted. Soft tissues and spinal canal: Surrounding soft tissues are within normal limits. Upper chest: Scarring is noted in the left apex. No other focal abnormality is noted. Other: None IMPRESSION: CT of the head: Chronic atrophic and ischemic changes without acute abnormality. CT of the cervical spine: Multilevel degenerative change without acute abnormality. Electronically Signed   By: Alcide Clever M.D.   On: 08/21/2018 12:25   Dg Pelvis Portable  Result Date: 08/21/2018 CLINICAL DATA:  Pelvic pain EXAM: PORTABLE PELVIS 1-2 VIEWS COMPARISON:  None. FINDINGS: Pelvic ring is intact. No acute fracture is noted. Mild degenerative changes of the hip joints are seen. No soft tissue changes are noted. IMPRESSION: Mild degenerative changes.  No acute abnormality seen. Electronically Signed   By: Alcide Clever M.D.   On: 08/21/2018 11:20   Ct L-spine No Charge  Result Date: 08/21/2018 CLINICAL DATA:  Back pain.  Fall.  Prior lumbar fractures. EXAM: CT LUMBAR SPINE WITHOUT CONTRAST TECHNIQUE: Multidetector CT imaging of the lumbar spine was performed without intravenous contrast administration. Multiplanar CT image reconstructions were also generated. COMPARISON:  CT lumbar myelogram 08/08/2017 FINDINGS: Segmentation: Normal Alignment: Mild anterolisthesis T11-T12, L1-2, L2-3, L3-4, L5-S1 Vertebrae: Chronic severe compression fracture T12 unchanged Chronic compression fracture L1 with interval cement vertebroplasty. No further fracture. Moderate to severe chronic compression fracture of L2 unchanged Mild superior endplate fracture of L3 unchanged Moderate compression fracture of L4 is new. Horizontal fracture line with sclerotic margins suggesting a subacute fracture with partial healing. This was not present previously. Moderate dextroscoliosis.  Negative for sacral  fracture. Paraspinal and other soft tissues: Mild atherosclerotic aorta. No paraspinous soft tissue mass. Disc levels: T12-L1: Negative for stenosis L1-2: Negative for stenosis L2-3: Moderate right foraminal stenosis due to facet hypertrophy. C3-4: Mild facet degeneration without stenosis L4-5: Moderate right foraminal encroachment due to marked facet hypertrophy. Mild spinal stenosis L5-S1: Moderate facet degeneration bilaterally with subarticular stenosis bilaterally. IMPRESSION: Chronic compression fractures of T12, L1, L2, and L3 Moderate compression of fracture of L4 not present on 08/08/2017. This may be a subacute fracture given the sclerotic margins to the fracture.  No retropulsion of bone and no spinal stenosis Multilevel degenerative change throughout the lumbar spine appears chronic. Electronically Signed   By: Marlan Palau M.D.   On: 08/21/2018 14:36   Dg Chest Portable 1 View  Result Date: 08/21/2018 CLINICAL DATA:  Altered mental status EXAM: PORTABLE CHEST 1 VIEW COMPARISON:  10/11/2016 FINDINGS: Cardiac shadows within normal limits. Spinal stimulator is noted midthoracic spine. Overall inspiratory effort is poor. Patchy infiltrative changes are noted in the left base new from the prior exam. No bony abnormality is seen. IMPRESSION: New left basilar infiltrate. Electronically Signed   By: Alcide Clever M.D.   On: 08/21/2018 11:16    Labs:  CBC: Recent Labs    08/21/18 1057 08/22/18 0612  WBC 11.3* 10.2  HGB 10.1* 9.9*  HCT 32.0* 30.3*  PLT 50* 33*    COAGS: Recent Labs    08/21/18 1815 08/22/18 0016  INR 1.87 1.80    BMP: Recent Labs    08/21/18 1057 08/21/18 2014 08/22/18 0016  NA 134* 136 138  K 3.3* 3.4* 3.1*  CL 102 106 106  CO2 21* 20* 21*  GLUCOSE 93 128* 108*  BUN 53* 49* 51*  CALCIUM 7.1* 7.1* 7.5*  CREATININE 2.92* 2.49* 2.46*  GFRNONAA 15* 18* 19*  GFRAA 18* 21* 22*    LIVER FUNCTION TESTS: Recent Labs    08/21/18 1057 08/22/18 0016    BILITOT 0.8  --   AST 51*  --   ALT 16  --   ALKPHOS 58  --   PROT 4.1*  --   ALBUMIN 2.0* 2.2*    TUMOR MARKERS: No results for input(s): AFPTM, CEA, CA199, CHROMGRNA in the last 8760 hours.  Assessment and Plan: Obstructing Stone Patient presented to [facility] ED with AMS, found to have a obstructing stone.  Percutaneous nephrostomy tube requested by Dr. Marlou Porch.  Patient with AMS ongoing this AM.  Her INR was elevated, platelets low.  She has been given FFP and platelets.  Husband contacted for consent.   Risks and benefits were discussed with the patient including, but not limited to, infection, bleeding, significant bleeding causing loss or decrease in renal function or damage to adjacent structures.   All of the patient's husband's questions were answered, he is agreeable to proceed.  Consent signed and in chart.   Thank you for this interesting consult.  I greatly enjoyed meeting ANAYANSI RUNDQUIST and look forward to participating in their care.  A copy of this report was sent to the requesting provider on this date.  Electronically Signed: Hoyt Koch, PA 08/22/2018, 8:27 AM   I spent a total of 40 Minutes    in face to face in clinical consultation, greater than 50% of which was counseling/coordinating care for obstructing stone.

## 2018-08-22 NOTE — Progress Notes (Signed)
Patient transferred to IR via bed with monitor and oxygen report given at bedside to IR RN platelets infusing and no signs of reaction

## 2018-08-22 NOTE — H&P (Signed)
NAME:  [name], MRN:  [id number], DOB:  1946-03-15, LOS: 1 ADMISSION DATE:  08/21/2018, CONSULTATION DATE:  1/14 REFERRING MD:  yates, CHIEF COMPLAINT:  Sepsis   Brief History   72 year old admitted 10/14  Found down w/ obstructive uropathy, left hydro, PNA and severe sepsis w/ resultant encephalopathy and AKI Recent MRSA skin infection treated by PCP  Past Medical History  Fibromyalgia, anemia, HTN, chronic pain on narcotics  Significant Hospital Events   Admitted 10/14  Consults: date of consult/date signed off & final recs:  PCCM 10/14 Urology called 10/14  Procedures (surgical and bedside):  IR perc nstomy 10/15 >>  Significant Diagnostic Tests:  Ct ABD/PELVIS 10/14 LEFT HYDRO W41mm stone. Bilateral PNA   Micro Data:  UC 10/14>>> BCX2 10/14>>> e coli >>  Antimicrobials:  Rocephin 10/14>> Azith X 1 zosyn 10/14 >>  Subjective:  Received Versed for procedure, poorly responsive, mumbling. Per RN was alert and interactive prior Remains critically ill on high flow oxygen and borderline hypotensive  Objective   Blood pressure 112/86, pulse 98, temperature (!) 97.3 F (36.3 C), temperature source Oral, resp. rate (!) 35, height 5\' 5"  (1.651 m), weight 66 kg, SpO2 (!) 88 %.        Intake/Output Summary (Last 24 hours) at 08/22/2018 1017 Last data filed at 08/22/2018 1000 Gross per 24 hour  Intake 6187.07 ml  Output 990 ml  Net 5197.07 ml   Filed Weights   08/21/18 1800  Weight: 66 kg    Examination: Gen. Elderly , frail appearing in mild resp  distress ENT - no pallor, icterus Neck: No JVD, no thyromegaly, no carotid bruits Lungs: no use of accessory muscles, no dullness to percussion, decreased without rales or rhonchi  Cardiovascular: Rhythm regular, heart sounds  normal, no murmurs or gallops, no peripheral edema Abdomen: soft and non-tender, no hepatosplenomegaly, BS normal. Musculoskeletal: No deformities, no cyanosis or clubbing Neuro:  Confused,  does not follow commands, no neck stiffness Skin - erythmatous spots on arms   Resolved Hospital Problem list     Assessment & Plan:    E coli septic shock due to  UTI source w/ left hydro H/o MRSA skin infection treated with doxy as putpt Plan Off neo gtt IV zosyn  Vanc x 1 dose to cover skin + pna  Acute hypoxic respiratory failure w/ bibasilar PNA -could be aspiration vs evolving ALI Plan HFNC  oxygen  IS NPO Hopeful to avoid intubation here   Lactic acidosis - resolved   AKI in setting of left hydro/obstructive uropathy w/ 6 mm stone S/p  perc drain Plan Cont IV hydration F/u serial chemistries.   Hypokalemia Plan Replace and recheck   Chronic back pain walks w/ cane  Plan  fent 25- 50 mcg q 2h prn Resume oxycodone 5mg  q 6h when able (home dose 90 mg/d) Hold MSIR for now  Acute metabolic Encephalopathy due to sepsis and acute renal failure. Likely a toxic component as well d/t medications Plan Treat sepsis Hold all sedating meds Supportive care  Disposition / Summary of Today's Plan 08/22/18   Sepsis appears improving, expect renal function to improve, but hypoxia concerning Delirium improved but now worse again likely due to pain and Versed given for procedure      Code Status: full code  Family Communication: Husband  Labs   CBC: Recent Labs  Lab 08/21/18 1057 08/22/18 0612  WBC 11.3* 10.2  NEUTROABS 10.0*  --   HGB 10.1*  9.9*  HCT 32.0* 30.3*  MCV 94.1 92.7  PLT 50* 33*    Basic Metabolic Panel: Recent Labs  Lab 08/21/18 1057 08/21/18 1815 08/21/18 2014 08/22/18 0016  NA 134*  --  136 138  K 3.3*  --  3.4* 3.1*  CL 102  --  106 106  CO2 21*  --  20* 21*  GLUCOSE 93  --  128* 108*  BUN 53*  --  49* 51*  CREATININE 2.92*  --  2.49* 2.46*  CALCIUM 7.1*  --  7.1* 7.5*  MG  --  1.6*  --   --   PHOS  --   --   --  3.8   GFR: Estimated Creatinine Clearance: 19.1 mL/min (A) (by C-G formula based on SCr of 2.46 mg/dL  (H)). Recent Labs  Lab 08/21/18 1057 08/21/18 1139 08/21/18 1254 08/21/18 1815 08/21/18 2116 08/22/18 0612  PROCALCITON  --   --   --  53.79  --  46.38  WBC 11.3*  --   --   --   --  10.2  LATICACIDVEN  --  3.09* 2.35* 3.5* 2.0*  --     Liver Function Tests: Recent Labs  Lab 08/21/18 1057 08/22/18 0016  AST 51*  --   ALT 16  --   ALKPHOS 58  --   BILITOT 0.8  --   PROT 4.1*  --   ALBUMIN 2.0* 2.2*   No results for input(s): LIPASE, AMYLASE in the last 168 hours. No results for input(s): AMMONIA in the last 168 hours.  ABG    Component Value Date/Time   TCO2 26 10/02/2013 1317     Coagulation Profile: Recent Labs  Lab 08/21/18 1815 08/22/18 0016  INR 1.87 1.80    Cardiac Enzymes: Recent Labs  Lab 08/21/18 1815 08/22/18 0016 08/22/18 0612  TROPONINI 0.05* 0.05* 0.04*    HbA1C: Hgb A1c MFr Bld  Date/Time Value Ref Range Status  04/22/2014 11:53 AM 5.3 4.6 - 6.5 % Final    Comment:    Glycemic Control Guidelines for People with Diabetes:Non Diabetic:  <6%Goal of Therapy: <7%Additional Action Suggested:  >8%     CBG: Recent Labs  Lab 08/21/18 1826 08/21/18 2344 08/22/18 0026 08/22/18 0416 08/22/18 0441  GLUCAP 103* 44* 71 60* 149*   My independent critical care time x 34m  Cyril Mourning MD. FCCP. [facility] Pager [phone] If no response call [phone]   08/22/2018

## 2018-08-22 NOTE — Consult Note (Signed)
I am following this patient for a left proximal/UPJ stone and associated urosepsis.  Interval:  The patient had a nephrostomy tube placed this morning.  Since that time, her hemodynamics have improved somewhat, she remains on Neo-Synephrine.    She has been afebrile.  Her mental status is improved slightly.  The nephrostomy tube has been draining relatively well, overall her urine output has not been stellar.  PE: Vitals:   08/22/18 1900 08/22/18 1915 08/22/18 1930 08/22/18 1956  BP: (!) 88/56  (!) 85/59   Pulse: 99 96 99   Resp: 14 15 15    Temp:    99 F (37.2 C)  TempSrc:    Oral  SpO2: 99% 99% 99%   Weight:      Height:        Intake/Output Summary (Last 24 hours) at 08/22/2018 2030 Last data filed at 08/22/2018 2000 Gross per 24 hour  Intake 5480.35 ml  Output 1380 ml  Net 4100.35 ml   Patient is alert and oriented to name only The patient is tender to palpation in the abdomen diffusely, seems to be more guarding the left quadrant. The patient has a nephrostomy tube imaging from the left CVA.  It is draining blood-tinged urine. The patient's extremities are symmetric, edematous  Recent Labs    08/21/18 1057 08/22/18 0612 08/22/18 1230  WBC 11.3* 10.2 3.7*  HGB 10.1* 9.9* 10.4*  HCT 32.0* 30.3* 33.4*   Recent Labs    08/21/18 2014 08/22/18 0016 08/22/18 1230  NA 136 138 139  K 3.4* 3.1* 3.0*  CL 106 106 111  CO2 20* 21* 17*  GLUCOSE 128* 108* 99  BUN 49* 51* 51*  CREATININE 2.49* 2.46* 2.36*  CALCIUM 7.1* 7.5* 7.9*   Recent Labs    08/21/18 1815 08/22/18 0016  INR 1.87 1.80   No results for input(s): PSA in the last 72 hours. No results for input(s): LABURIN in the last 72 hours. Results for orders placed or performed during the hospital encounter of 08/21/18  Blood culture (routine x 2)     Status: None (Preliminary result)   Collection Time: 08/21/18 11:02 AM  Result Value Ref Range Status   Specimen Description BLOOD RIGHT HAND  Final    Special Requests   Final    BOTTLES DRAWN AEROBIC AND ANAEROBIC Blood Culture results may not be optimal due to an inadequate volume of blood received in culture bottles   Culture  Setup Time   Final    GRAM NEGATIVE RODS IN BOTH AEROBIC AND ANAEROBIC BOTTLES Organism ID to follow CRITICAL RESULT CALLED TO, READ BACK BY AND VERIFIED WITHShela Commons [facility] PHARMD [id number] 08/22/18 A BROWNING    Culture   Final    NO GROWTH 1 DAY Performed at [facility], [address], [place], [place] [address]    Report Status PENDING  Incomplete  Blood Culture ID Panel (Reflexed)     Status: Abnormal   Collection Time: 08/21/18 11:02 AM  Result Value Ref Range Status   Enterococcus species NOT DETECTED NOT DETECTED Final   Vancomycin resistance NOT DETECTED NOT DETECTED Final   Listeria monocytogenes NOT DETECTED NOT DETECTED Final   Staphylococcus species NOT DETECTED NOT DETECTED Final   Staphylococcus aureus (BCID) NOT DETECTED NOT DETECTED Final   Methicillin resistance NOT DETECTED NOT DETECTED Final   Streptococcus species NOT DETECTED NOT DETECTED Final   Streptococcus agalactiae NOT DETECTED NOT DETECTED Final   Streptococcus pneumoniae NOT DETECTED NOT  DETECTED Final   Streptococcus pyogenes NOT DETECTED NOT DETECTED Final   Acinetobacter baumannii NOT DETECTED NOT DETECTED Final   Enterobacteriaceae species DETECTED (A) NOT DETECTED Final    Comment: CRITICAL RESULT CALLED TO, READ BACK BY AND VERIFIED WITHMelven Sartorius PHARMD 1610 08/22/18 A BROWNING Enterobacteriaceae represent a large family of gram-negative bacteria, not a single organism.    Enterobacter cloacae complex NOT DETECTED NOT DETECTED Final   Escherichia coli DETECTED (A) NOT DETECTED Final    Comment: CRITICAL RESULT CALLED TO, READ BACK BY AND VERIFIED WITH: Melven Sartorius [facility] 9604 08/22/18 A BROWNING    Klebsiella oxytoca NOT DETECTED NOT DETECTED Final   Klebsiella pneumoniae NOT DETECTED NOT DETECTED Final   Proteus  species NOT DETECTED NOT DETECTED Final   Serratia marcescens NOT DETECTED NOT DETECTED Final   Carbapenem resistance NOT DETECTED NOT DETECTED Final   Haemophilus influenzae NOT DETECTED NOT DETECTED Final   Neisseria meningitidis NOT DETECTED NOT DETECTED Final   Pseudomonas aeruginosa NOT DETECTED NOT DETECTED Final   Candida albicans NOT DETECTED NOT DETECTED Final   Candida glabrata NOT DETECTED NOT DETECTED Final   Candida krusei NOT DETECTED NOT DETECTED Final   Candida parapsilosis NOT DETECTED NOT DETECTED Final   Candida tropicalis NOT DETECTED NOT DETECTED Final    Comment: Performed at [facility], [name], [place], [place] [address]  Blood culture (routine x 2)     Status: None (Preliminary result)   Collection Time: 08/21/18 11:30 AM  Result Value Ref Range Status   Specimen Description BLOOD LEFT HAND  Final   Special Requests   Final    BOTTLES DRAWN AEROBIC AND ANAEROBIC Blood Culture results may not be optimal due to an inadequate volume of blood received in culture bottles   Culture  Setup Time   Final    GRAM NEGATIVE RODS IN BOTH AEROBIC AND ANAEROBIC BOTTLES    Culture   Final    NO GROWTH 1 DAY Performed at [facility], [address], [place], [place] [address]    Report Status PENDING  Incomplete  Urine culture     Status: Abnormal (Preliminary result)   Collection Time: 08/21/18 12:33 PM  Result Value Ref Range Status   Specimen Description URINE, CATHETERIZED  Final   Special Requests   Final    NONE Performed at [facility], [address], [place], [place] [address]    Culture >=100,000 COLONIES/mL GRAM NEGATIVE RODS (A)  Final   Report Status PENDING  Incomplete  MRSA PCR Screening     Status: Abnormal   Collection Time: 08/21/18  5:53 PM  Result Value Ref Range Status   MRSA by PCR POSITIVE (A) NEGATIVE Final    Comment:        The GeneXpert MRSA Assay (FDA approved for NASAL specimens only), is one component of  a comprehensive MRSA colonization surveillance program. It is not intended to diagnose MRSA infection nor to guide or monitor treatment for MRSA infections. RESULT CALLED TO, READ BACK BY AND VERIFIED WITH: Tommie Sams RN 08/21/18 1936 JDW Performed at [facility], [address], [place], [place] [address]     Impression: Urosepsis with gram-negative rods in the urine and blood.  The patient now has a nephrostomy tube and is settling down.  She continues to have a Neo-Synephrine requirement.  Her urine output is adequate.  Plan: From my perspective the patient needs to be recovered  fully prior to proceeding with stone removal.  I will plan to follow-up with the patient in our clinic once she is fully encouraged and out of the hospital to discuss stone removal.

## 2018-08-22 NOTE — Progress Notes (Deleted)
CRITICAL VALUE ALERT  Critical Value:  CBG-44  Date & Time Notified:  08/21/2018 2344  Provider Notified: Pola Corn, Dr. Arsenio Loader  Orders Received/Actions taken: Gave 25mL D50 per hyoglycemia standing order protocol. Change IVF to D5NS @ 66mL/hr instead of LR. Will continue to monitor closely. 0027-rechecked CBG 71 Caswell Corwin, RN 08/22/18 1:00 AM

## 2018-08-22 NOTE — Progress Notes (Addendum)
Hypoglycemic Event  CBG: 44 @ 08/21/2018 2344  Treatment: D50 IV 25 mL  Symptoms: None  Follow-up CBG: Time:0027 08/22/2018 CBG Result:71  Possible Reasons for Event: Inadequate meal intake and Unknown  Comments/MD notified:ELINK Dr. Arsenio Loader notified, changed IVF to D5NS. Will continue to monitor closely.   Caswell Corwin  08/22/2018 1:00 AM

## 2018-08-22 NOTE — Progress Notes (Signed)
Pharmacy Antibiotic Note  [name] is a 72 y.o. female admitted on 08/21/2018 with sepsis.  Pharmacy has been consulted for zosyn dosing. On admission the patient was experiencing acute renal failure, but since patient's renal function is improving (CrCl ~25 mL/min) will adjust zosyn dose at this time.  Plan: Start zosyn 3.375g IV Q8H (4-hour infusion) Follow up renal function, LOT, cultures and sensitivities Follow up de-escalation of antibiotics  Height: 5\' 5"  (165.1 cm) Weight: 145 lb 8.1 oz (66 kg) IBW/kg (Calculated) : 57  Temp (24hrs), Avg:97.8 F (36.6 C), Min:97.3 F (36.3 C), Max:99.7 F (37.6 C)  Recent Labs  Lab 08/21/18 1057 08/21/18 1139 08/21/18 1254 08/21/18 1815 08/21/18 2014 08/21/18 2116 08/22/18 0016 08/22/18 0612 08/22/18 1230  WBC 11.3*  --   --   --   --   --   --  10.2  --   CREATININE 2.92*  --   --   --  2.49*  --  2.46*  --  2.36*  LATICACIDVEN  --  3.09* 2.35* 3.5*  --  2.0*  --   --   --     Estimated Creatinine Clearance: 20 mL/min (A) (by C-G formula based on SCr of 2.36 mg/dL (H)).    No Known Allergies  Antimicrobials this admission: Zosyn 10/14 >>  Vancomycin 10/15 x 1  Azithromycin 10/14 x1 Ceftriaxone 10/14 x1   Microbiology results: 10/15 UCx:  10/14 BCx: E Coli (BCID) 10/14 MRSA PCR - positive 10/14 UCx: GNR  Thank you for allowing pharmacy to be a part of this patient's care.  Lenord Carbo, PharmD PGY1 Pharmacy Resident Phone: [phone]  Please check AMION for all [facility] Pharmacy phone numbers 08/22/2018 2:03 PM

## 2018-08-22 NOTE — Procedures (Signed)
Interventional Radiology Procedure Note  Procedure: Left percutaneous nephrostomy  Complications: None  Estimated Blood Loss: < 10 mL  Findings: 10 Fr left PCN placed and formed in left renal pelvis.  Attached to gravity bag drainage. Urine sample sent for culture.  Jodi Marble. Fredia Sorrow, M.D Pager:  [phone]

## 2018-08-22 NOTE — Progress Notes (Signed)
Dr Vassie Loll informed of previous order from Anders Simmonds NP to give 5 runs of KCL for K+ 3.0 and that urine output is decreased to approximately 30 cc/hr

## 2018-08-22 NOTE — Progress Notes (Signed)
Hypoglycemic Event  CBG: 23  Treatment: D50 IV 50 mL  Symptoms: Shaky  Follow-up CBG: Time:11:04 CBG Result:79  Possible Reasons for Event: Inadequate meal intake  Comments/MD notified:Dr Alva informed and D10 infusion started in addition to [employer], Emaline Karnes W

## 2018-08-22 NOTE — Progress Notes (Signed)
[name] is lethargic will answer questions with one word response .have to touch her to open her eyes . Her systolic blood pressure is low 76-82. Dr Vassie Loll informed and he assessed patient at bedside. NS 250 bolus started and neo ordered for Blood pressure

## 2018-08-22 NOTE — Progress Notes (Signed)
eLink Physician-Brief Progress Note Patient Name: [name] DOB: 09-Jul-1946 MRN: [id number]   Date of Service  08/22/2018  HPI/Events of Note  Transient increase of HR to 162 for about a minute. Now Sinus Tach with rate = 102.   eICU Interventions  Will order: 1. BMP and Mg++ level STAT.     Intervention Category Major Interventions: Arrhythmia - evaluation and management  Sommer,Steven Eugene 08/22/2018, 11:07 PM

## 2018-08-22 NOTE — Progress Notes (Signed)
RT placed pt on ETCO2 monitoring due to less responsiveness.  RN will notify MD.

## 2018-08-22 NOTE — Progress Notes (Signed)
eLink Physician-Brief Progress Note Patient Name: [name] DOB: 07-Aug-1946 MRN: [id number]   Date of Service  08/22/2018  HPI/Events of Note  Hypoglycemia - Blood glucose = 60. Already given D50 by bedside nurse.   eICU Interventions  Will order: 1. Increase D5 0.45 NaCl to 100 mL/hour.      Intervention Category Major Interventions: Other:  Lenell Antu 08/22/2018, 4:36 AM

## 2018-08-22 NOTE — Progress Notes (Signed)
PHARMACY - PHYSICIAN COMMUNICATION CRITICAL VALUE ALERT - BLOOD CULTURE IDENTIFICATION (BCID)  [name] is an 72 y.o. female who presented to [facility] on 08/21/2018 with a chief complaint of altered mental status  Assessment: Sepsis, likely urinary source, also covering for PNA as well  Name of physician (or Provider) Contacted: Dr. Arsenio Loader  Current antibiotics: Zosyn  Changes to prescribed antibiotics recommended:  No changes for now  Results for orders placed or performed during the hospital encounter of 08/21/18  Blood Culture ID Panel (Reflexed) (Collected: 08/21/2018 11:02 AM)  Result Value Ref Range   Enterococcus species NOT DETECTED NOT DETECTED   Vancomycin resistance NOT DETECTED NOT DETECTED   Listeria monocytogenes NOT DETECTED NOT DETECTED   Staphylococcus species NOT DETECTED NOT DETECTED   Staphylococcus aureus (BCID) NOT DETECTED NOT DETECTED   Methicillin resistance NOT DETECTED NOT DETECTED   Streptococcus species NOT DETECTED NOT DETECTED   Streptococcus agalactiae NOT DETECTED NOT DETECTED   Streptococcus pneumoniae NOT DETECTED NOT DETECTED   Streptococcus pyogenes NOT DETECTED NOT DETECTED   Acinetobacter baumannii NOT DETECTED NOT DETECTED   Enterobacteriaceae species DETECTED (A) NOT DETECTED   Enterobacter cloacae complex NOT DETECTED NOT DETECTED   Escherichia coli DETECTED (A) NOT DETECTED   Klebsiella oxytoca NOT DETECTED NOT DETECTED   Klebsiella pneumoniae NOT DETECTED NOT DETECTED   Proteus species NOT DETECTED NOT DETECTED   Serratia marcescens NOT DETECTED NOT DETECTED   Carbapenem resistance NOT DETECTED NOT DETECTED   Haemophilus influenzae NOT DETECTED NOT DETECTED   Neisseria meningitidis NOT DETECTED NOT DETECTED   Pseudomonas aeruginosa NOT DETECTED NOT DETECTED   Candida albicans NOT DETECTED NOT DETECTED   Candida glabrata NOT DETECTED NOT DETECTED   Candida krusei NOT DETECTED NOT DETECTED   Candida parapsilosis NOT DETECTED  NOT DETECTED   Candida tropicalis NOT DETECTED NOT DETECTED    Abran Duke 08/22/2018  3:57 AM

## 2018-08-23 ENCOUNTER — Inpatient Hospital Stay (HOSPITAL_COMMUNITY): Payer: Medicare Other

## 2018-08-23 LAB — BPAM FFP
Blood Product Expiration Date: 201910152359
Blood Product Expiration Date: 201910152359
Blood Product Expiration Date: 201910152359
ISSUE DATE / TIME: 201910150003
ISSUE DATE / TIME: 201910150255
ISSUE DATE / TIME: 201910142122
UNIT TYPE AND RH: 6200
Unit Type and Rh: 600
Unit Type and Rh: 6200

## 2018-08-23 LAB — GLUCOSE, CAPILLARY
GLUCOSE-CAPILLARY: 107 mg/dL — AB (ref 70–99)
GLUCOSE-CAPILLARY: 116 mg/dL — AB (ref 70–99)
GLUCOSE-CAPILLARY: 82 mg/dL (ref 70–99)
GLUCOSE-CAPILLARY: 95 mg/dL (ref 70–99)
Glucose-Capillary: 101 mg/dL — ABNORMAL HIGH (ref 70–99)
Glucose-Capillary: 115 mg/dL — ABNORMAL HIGH (ref 70–99)
Glucose-Capillary: 57 mg/dL — ABNORMAL LOW (ref 70–99)
Glucose-Capillary: 86 mg/dL (ref 70–99)
Glucose-Capillary: 96 mg/dL (ref 70–99)

## 2018-08-23 LAB — MAGNESIUM: Magnesium: 2 mg/dL (ref 1.7–2.4)

## 2018-08-23 LAB — PREPARE FRESH FROZEN PLASMA
UNIT DIVISION: 0
Unit division: 0

## 2018-08-23 LAB — CBC WITH DIFFERENTIAL/PLATELET
Abs Immature Granulocytes: 0.07 10*3/uL (ref 0.00–0.07)
Basophils Absolute: 0 10*3/uL (ref 0.0–0.1)
Basophils Relative: 0 %
Eosinophils Absolute: 0.2 10*3/uL (ref 0.0–0.5)
Eosinophils Relative: 1 %
HCT: 39.2 % (ref 36.0–46.0)
HEMOGLOBIN: 12 g/dL (ref 12.0–15.0)
IMMATURE GRANULOCYTES: 1 %
LYMPHS ABS: 0.6 10*3/uL — AB (ref 0.7–4.0)
LYMPHS PCT: 4 %
MCH: 30.3 pg (ref 26.0–34.0)
MCHC: 30.6 g/dL (ref 30.0–36.0)
MCV: 99 fL (ref 80.0–100.0)
MONO ABS: 0.5 10*3/uL (ref 0.1–1.0)
MONOS PCT: 3 %
NEUTROS ABS: 14.1 10*3/uL — AB (ref 1.7–7.7)
Neutrophils Relative %: 91 %
Platelets: 27 10*3/uL — CL (ref 150–400)
RBC: 3.96 MIL/uL (ref 3.87–5.11)
RDW: 13.8 % (ref 11.5–15.5)
WBC: 15.4 10*3/uL — AB (ref 4.0–10.5)
nRBC: 0 % (ref 0.0–0.2)

## 2018-08-23 LAB — URINE CULTURE: Culture: >=100000 — AB

## 2018-08-23 LAB — CBC
HEMATOCRIT: 32.6 % — AB (ref 36.0–46.0)
HEMOGLOBIN: 10.3 g/dL — AB (ref 12.0–15.0)
MCH: 29.7 pg (ref 26.0–34.0)
MCHC: 31.6 g/dL (ref 30.0–36.0)
MCV: 93.9 fL (ref 80.0–100.0)
Platelets: 24 10*3/uL — CL (ref 150–400)
RBC: 3.47 MIL/uL — ABNORMAL LOW (ref 3.87–5.11)
RDW: 13.5 % (ref 11.5–15.5)
WBC: 19.8 10*3/uL — ABNORMAL HIGH (ref 4.0–10.5)
nRBC: 0 % (ref 0.0–0.2)

## 2018-08-23 LAB — BASIC METABOLIC PANEL
Anion gap: 11 (ref 5–15)
BUN: 51 mg/dL — ABNORMAL HIGH (ref 8–23)
CALCIUM: 8 mg/dL — AB (ref 8.9–10.3)
CO2: 18 mmol/L — AB (ref 22–32)
CREATININE: 2.27 mg/dL — AB (ref 0.44–1.00)
Chloride: 111 mmol/L (ref 98–111)
GFR calc Af Amer: 24 mL/min — ABNORMAL LOW (ref >60)
GFR calc non Af Amer: 21 mL/min — ABNORMAL LOW (ref >60)
GLUCOSE: 114 mg/dL — AB (ref 70–99)
Potassium: 3.7 mmol/L (ref 3.5–5.1)
Sodium: 140 mmol/L (ref 135–145)

## 2018-08-23 LAB — PREPARE PLATELET PHERESIS: Unit division: 0

## 2018-08-23 LAB — PHOSPHORUS: PHOSPHORUS: 2.8 mg/dL (ref 2.5–4.6)

## 2018-08-23 LAB — BPAM PLATELET PHERESIS
Blood Product Expiration Date: 201910162359
ISSUE DATE / TIME: 201910150628
Unit Type and Rh: 5100

## 2018-08-23 LAB — PROCALCITONIN: PROCALCITONIN: 31.33 ng/mL

## 2018-08-23 MED ORDER — DEXTROSE 50 % IV SOLN: 50.0000 mL | Freq: Once | INTRAVENOUS | Status: AC

## 2018-08-23 MED ORDER — ORAL CARE MOUTH RINSE
15.0000 mL | Freq: Two times a day (BID) | OROMUCOSAL | Status: DC
  Administered 2018-08-23 – 2018-08-25 (×4): 15 mL via OROMUCOSAL

## 2018-08-23 MED ORDER — ORAL CARE MOUTH RINSE
15.0000 mL | Freq: Two times a day (BID) | OROMUCOSAL | Status: DC
  Administered 2018-08-23 – 2018-08-30 (×14): 15 mL via OROMUCOSAL

## 2018-08-23 MED ORDER — CHLORHEXIDINE GLUCONATE 0.12 % MT SOLN
15.0000 mL | Freq: Two times a day (BID) | OROMUCOSAL | Status: DC
  Administered 2018-08-23 – 2018-09-01 (×19): 15 mL via OROMUCOSAL
  Filled 2018-08-23 (×15): qty 15

## 2018-08-23 MED ORDER — METOPROLOL TARTRATE 5 MG/5ML IV SOLN
2.5000 mg | INTRAVENOUS | Status: DC | PRN
  Administered 2018-08-23 – 2018-08-24 (×3): 5 mg via INTRAVENOUS
  Filled 2018-08-23 (×3): qty 5

## 2018-08-23 MED ORDER — DEXTROSE 50 % IV SOLN
INTRAVENOUS | Status: AC
  Administered 2018-08-23: 50 mL
  Filled 2018-08-23: qty 50

## 2018-08-23 MED ORDER — SODIUM CHLORIDE 0.9 % IV SOLN
2.0000 g | INTRAVENOUS | Status: DC
  Administered 2018-08-23 – 2018-08-27 (×5): 2 g via INTRAVENOUS
  Filled 2018-08-23 (×5): qty 20

## 2018-08-23 NOTE — Progress Notes (Signed)
Initial Nutrition Assessment  DOCUMENTATION CODES:   Severe malnutrition in context of chronic illness  INTERVENTION:    Add PO supplements when diet is advanced  Suggest checking labs for additional vitamin / mineral deficiencies (copper, zinc, thiamine, folate, vitamin D, iron, calcium)  NUTRITION DIAGNOSIS:   Severe Malnutrition related to chronic illness(suspected malabsorption from bariatric surgery in 2008) as evidenced by severe fat depletion, severe muscle depletion, percent weight loss(22% weight loss in one year).  GOAL:   Patient will meet greater than or equal to 90% of their needs  MONITOR:   Diet advancement, PO intake, Labs  REASON FOR ASSESSMENT:   Malnutrition Screening Tool    ASSESSMENT:   72 yo female with PMH of recent skin infection, fibromyalgia, HLD, ADD, GERD, osteoporosis, PICA, bariatric surgery 2008, HTN, essential tremor, and B-12 deficiency, who was admitted on 10/14 with obstructive uropathy (UPJ stone), PNA, severe sepsis, AKI. Nephrostomy tube placed on 10/15.  Patient unable to provide any nutrition hx. From review of weight encounters, patient has lost from 85 kg to 66 kg in the past year. 22% weight loss within one year is significant for the time frame.  Labs reviewed. BUN 51 (H), creatinine 2.27 (H) Corrected calcium 9.44 (WNL) CBG's: 101-96-82-57-107 Medications reviewed and include neosynephrine.  Currently NPO. Will add supplements when diet is advanced.    Patient has severe malnutrition related to suspected malabsorption from bariatric surgery 11 years ago. She has a hx of PICA (likely related to a vitamin / mineral deficiency) and B-12 deficiency. Other nutrient deficiencies may be present.  NUTRITION - FOCUSED PHYSICAL EXAM:    Most Recent Value  Orbital Region  Severe depletion  Upper Arm Region  Unable to assess  Thoracic and Lumbar Region  Unable to assess  Buccal Region  Severe depletion  Temple Region  Severe  depletion  Clavicle Bone Region  Severe depletion  Clavicle and Acromion Bone Region  Moderate depletion  Scapular Bone Region  Moderate depletion  Dorsal Hand  Unable to assess  Patellar Region  No depletion  Anterior Thigh Region  No depletion  Posterior Calf Region  Mild depletion  Edema (RD Assessment)  Moderate  Hair  Reviewed  Eyes  Reviewed  Mouth  Unable to assess  Skin  Reviewed  Nails  Reviewed       Diet Order:   Diet Order            Diet NPO time specified  Diet effective now              EDUCATION NEEDS:   No education needs have been identified at this time  Skin:  Skin Assessment: Skin Integrity Issues: Skin Integrity Issues:: DTI DTI: R lower back, R heel  Last BM:  PTA  Height:   Ht Readings from Last 1 Encounters:  08/21/18 5\' 5"  (1.651 m)    Weight:   Wt Readings from Last 1 Encounters:  08/23/18 66 kg    Ideal Body Weight:  56.8 kg  BMI:  Body mass index is 24.21 kg/m.  Estimated Nutritional Needs:   Kcal:  1650-1850  Protein:  95-110 gm  Fluid:  1.8 L    Joaquin Courts, RD, LDN, CNSC Pager [phone] After Hours Pager [phone]

## 2018-08-23 NOTE — Progress Notes (Signed)
eLink Physician-Brief Progress Note Patient Name: AMBRIE CARTE DOB: Apr 13, 1946 MRN: [id number]   Date of Service  08/23/2018  HPI/Events of Note  Thrombocytopenia - Platelet count = 24K. Not actively bleeding.   eICU Interventions  Will order: 1. Repeat CBC with Platelets at 9 AM.      Intervention Category Intermediate Interventions: Thrombocytopenia - evaluation and management  Sommer,Steven Eugene 08/23/2018, 1:39 AM

## 2018-08-23 NOTE — Progress Notes (Signed)
Patient is in Afib with RVR per EKG Dr Vassie Loll paged and informed . Metoprolol 5 mg given and fentanyl 25 mcgs given

## 2018-08-23 NOTE — Progress Notes (Signed)
Hypoglycemic Event  CBG:57  Treatment: D50 IV 50 mL  Symptoms: Nervous/irritable  Follow-up CBG: Time:1215 CBG Result:107  Possible Reasons for Event: Inadequate meal intake  Comments/MD notified:Dr Sheran Luz, Mellody Memos

## 2018-08-23 NOTE — Progress Notes (Addendum)
NAME:  NIURKA BENECKE, MRN:  [id number], DOB:  1946-03-29, LOS: 2 ADMISSION DATE:  08/21/2018, CONSULTATION DATE:  1/14 REFERRING MD:  yates, CHIEF COMPLAINT:  Sepsis   Brief History   72 year old admitted 10/14  Found down w/ obstructive uropathy, left hydro, PNA and severe sepsis w/ resultant encephalopathy and AKI Recent MRSA skin infection treated by PCP  Past Medical History  Fibromyalgia, anemia, HTN, chronic pain on narcotics  Significant Hospital Events   Admitted 10/14 10/15 hypoglycemia  Consults: date of consult/date signed off & final recs:  PCCM 10/14 Urology  10/14 >> FU in clinic for stone removal  Procedures (surgical and bedside):  IR perc nstomy 10/15 >>  Significant Diagnostic Tests:  Ct ABD/PELVIS 10/14 LEFT HYDRO W25mm stone. Bilateral PNA   Micro Data:  UC 10/14>>>e coli S to cefazolin/ ceftx BCX2 10/14>>> e coli >>  Antimicrobials:  Rocephin 10/14>> Azith X 1 zosyn 10/14 >>10/16 Ceftx 10/16 >>  Subjective:  Afebrile Low dose Neo gtt being tapered UO improving  Objective   Blood pressure 128/80, pulse (!) 108, temperature 98.2 F (36.8 C), temperature source Oral, resp. rate (!) 32, height 5\' 5"  (1.651 m), weight 66 kg, SpO2 98 %.        Intake/Output Summary (Last 24 hours) at 08/23/2018 0840 Last data filed at 08/23/2018 0800 Gross per 24 hour  Intake 5640.33 ml  Output 945 ml  Net 4695.33 ml   Filed Weights   08/21/18 1800 08/23/18 0500  Weight: 66 kg 66 kg    Examination: Elderly frail woman, lethargic, supine in bed. Follows one-step commands, able to tell me her name Dry mucosa, no JVD 1+ edema lower arms and legs Decreased breath sounds bilateral Soft and nontender abdomen S1-S2 normal   Resolved Hospital Problem list    Lactic acidosis   Assessment & Plan:    E coli septic shock due to  UTI source w/ left hydro H/o MRSA skin infection treated with doxy as putpt Plan Taper neo gtt Simplify antibiotics to  ceftriaxone Discontinue vancomycin   Acute hypoxic respiratory failure w/ bibasilar PNA -could be aspiration vs evolving ALI Chest x-ray with slight worse infiltrates left lower lobe and right upper lobe Plan HFNC  oxygen  IS Hopeful to avoid intubation here    AKI in setting of left hydro/obstructive uropathy w/ 6 mm stone, improving S/p  perc drain Plan Decrease fluids to 50/hour F/u serial chemistries.   Hypokalemia Plan Replaced  Severe thrombocytopenia-admitted with 50 K, likely related to sepsis -Discontinue heparin for now, use SCDs  Chronic back pain walks w/ cane  Plan  fent 25- 50 mcg q 2h prn Resume oxycodone 5mg  q 6h when able to take PO (home dose 90 mg/d) Hold MSIR until renal function improved  Acute metabolic Encephalopathy due to sepsis and acute renal failure. Likely a toxic component as well d/t medications Plan Treat sepsis Hold all sedating meds PT consult  Disposition / Summary of Today's Plan 08/23/18   Sepsis appears &  renal function improving, but hypoxia persists Delirium persistent but appears slightly improved, will attempt PT. Will need diuresis at some point      Code Status: full code  Family Communication: Husband  Labs   CBC: Recent Labs  Lab 08/21/18 1057 08/22/18 0612 08/22/18 1230 08/23/18 0034  WBC 11.3* 10.2 3.7* 19.8*  NEUTROABS 10.0*  --   --   --   HGB 10.1* 9.9* 10.4* 10.3*  HCT 32.0* 30.3*  33.4* 32.6*  MCV 94.1 92.7 94.6 93.9  PLT 50* 33* PLATELET CLUMPS NOTED ON SMEAR, UNABLE TO ESTIMATE 24*    Basic Metabolic Panel: Recent Labs  Lab 08/21/18 1057 08/21/18 1815 08/21/18 2014 08/22/18 0016 08/22/18 1230 08/23/18 0034  NA 134*  --  136 138 139 140  K 3.3*  --  3.4* 3.1* 3.0* 3.7  CL 102  --  106 106 111 111  CO2 21*  --  20* 21* 17* 18*  GLUCOSE 93  --  128* 108* 99 114*  BUN 53*  --  49* 51* 51* 51*  CREATININE 2.92*  --  2.49* 2.46* 2.36* 2.27*  CALCIUM 7.1*  --  7.1* 7.5* 7.9* 8.0*  MG  --   1.6*  --   --   --  2.0  PHOS  --   --   --  3.8  --  2.8   GFR: Estimated Creatinine Clearance: 20.8 mL/min (A) (by C-G formula based on SCr of 2.27 mg/dL (H)). Recent Labs  Lab 08/21/18 1057 08/21/18 1139 08/21/18 1254 08/21/18 1815 08/21/18 2116 08/22/18 0612 08/22/18 1230 08/23/18 0034  PROCALCITON  --   --   --  53.79  --  46.38  --  31.33  WBC 11.3*  --   --   --   --  10.2 3.7* 19.8*  LATICACIDVEN  --  3.09* 2.35* 3.5* 2.0*  --   --   --     Liver Function Tests: Recent Labs  Lab 08/21/18 1057 08/22/18 0016  AST 51*  --   ALT 16  --   ALKPHOS 58  --   BILITOT 0.8  --   PROT 4.1*  --   ALBUMIN 2.0* 2.2*   No results for input(s): LIPASE, AMYLASE in the last 168 hours. No results for input(s): AMMONIA in the last 168 hours.  ABG    Component Value Date/Time   TCO2 26 10/02/2013 1317     Coagulation Profile: Recent Labs  Lab 08/21/18 1815 08/22/18 0016  INR 1.87 1.80    Cardiac Enzymes: Recent Labs  Lab 08/21/18 1815 08/22/18 0016 08/22/18 0612  TROPONINI 0.05* 0.05* 0.04*    HbA1C: Hgb A1c MFr Bld  Date/Time Value Ref Range Status  04/22/2014 11:53 AM 5.3 4.6 - 6.5 % Final    Comment:    Glycemic Control Guidelines for People with Diabetes:Non Diabetic:  <6%Goal of Therapy: <7%Additional Action Suggested:  >8%     CBG: Recent Labs  Lab 08/22/18 1539 08/22/18 1953 08/23/18 0024 08/23/18 0407 08/23/18 0741  GLUCAP 115* 106* 101* 96 82   My critical care time x 82m  Cyril Mourning MD. FCCP. [facility] Pager [phone] If no response call [phone]   08/23/2018

## 2018-08-23 NOTE — Progress Notes (Signed)
CRITICAL VALUE ALERT  Critical Value:  24  Date & Time Notied:  08/23/2018 at 0145  Provider Notified: Dr. Dellie Catholic  Orders Received/Actions taken: No new orders at this time.

## 2018-08-23 NOTE — Progress Notes (Signed)
Platelet count 27,000 results reported to Dr  Vassie Loll

## 2018-08-24 ENCOUNTER — Inpatient Hospital Stay (HOSPITAL_COMMUNITY): Payer: Medicare Other

## 2018-08-24 ENCOUNTER — Ambulatory Visit: Payer: Medicare Other | Admitting: Cardiology

## 2018-08-24 DIAGNOSIS — F1123 Opioid dependence with withdrawal: Secondary | ICD-10-CM

## 2018-08-24 DIAGNOSIS — R7881 Bacteremia: Secondary | ICD-10-CM

## 2018-08-24 DIAGNOSIS — E43 Unspecified severe protein-calorie malnutrition: Secondary | ICD-10-CM

## 2018-08-24 LAB — CULTURE, BLOOD (ROUTINE X 2)

## 2018-08-24 LAB — PHOSPHORUS: PHOSPHORUS: 2.6 mg/dL (ref 2.5–4.6)

## 2018-08-24 LAB — BASIC METABOLIC PANEL
ANION GAP: 9 (ref 5–15)
BUN: 48 mg/dL — ABNORMAL HIGH (ref 8–23)
CALCIUM: 8.2 mg/dL — AB (ref 8.9–10.3)
CO2: 17 mmol/L — ABNORMAL LOW (ref 22–32)
Chloride: 113 mmol/L — ABNORMAL HIGH (ref 98–111)
Creatinine, Ser: 1.98 mg/dL — ABNORMAL HIGH (ref 0.44–1.00)
GFR, EST AFRICAN AMERICAN: 28 mL/min — AB (ref >60)
GFR, EST NON AFRICAN AMERICAN: 24 mL/min — AB (ref >60)
Glucose, Bld: 108 mg/dL — ABNORMAL HIGH (ref 70–99)
POTASSIUM: 3.4 mmol/L — AB (ref 3.5–5.1)
Sodium: 139 mmol/L (ref 135–145)

## 2018-08-24 LAB — URINE CULTURE: Culture: >=100000 — AB

## 2018-08-24 LAB — CBC
HCT: 34 % — ABNORMAL LOW (ref 36.0–46.0)
Hemoglobin: 11.7 g/dL — ABNORMAL LOW (ref 12.0–15.0)
MCH: 30.2 pg (ref 26.0–34.0)
MCHC: 34.4 g/dL (ref 30.0–36.0)
MCV: 87.9 fL (ref 80.0–100.0)
NRBC: 0 % (ref 0.0–0.2)
PLATELETS: 16 10*3/uL — AB (ref 150–400)
RBC: 3.87 MIL/uL (ref 3.87–5.11)
RDW: 13.6 % (ref 11.5–15.5)
WBC: 22.8 10*3/uL — AB (ref 4.0–10.5)

## 2018-08-24 LAB — GLUCOSE, CAPILLARY
GLUCOSE-CAPILLARY: 78 mg/dL (ref 70–99)
GLUCOSE-CAPILLARY: 112 mg/dL — AB (ref 70–99)
GLUCOSE-CAPILLARY: 84 mg/dL (ref 70–99)
Glucose-Capillary: 107 mg/dL — ABNORMAL HIGH (ref 70–99)
Glucose-Capillary: 111 mg/dL — ABNORMAL HIGH (ref 70–99)
Glucose-Capillary: 76 mg/dL (ref 70–99)

## 2018-08-24 LAB — MAGNESIUM: Magnesium: 1.9 mg/dL (ref 1.7–2.4)

## 2018-08-24 MED ORDER — DEXMEDETOMIDINE HCL IN NACL 400 MCG/100ML IV SOLN
0.4000 ug/kg/h | INTRAVENOUS | Status: DC
  Administered 2018-08-24: 0.4 ug/kg/h via INTRAVENOUS
  Filled 2018-08-24 (×2): qty 100

## 2018-08-24 MED ORDER — FUROSEMIDE 10 MG/ML IJ SOLN
40.0000 mg | Freq: Once | INTRAMUSCULAR | Status: AC
  Administered 2018-08-24: 40 mg via INTRAVENOUS
  Filled 2018-08-24: qty 4

## 2018-08-24 MED ORDER — POTASSIUM PHOSPHATES 15 MMOLE/5ML IV SOLN
30.0000 mmol | Freq: Once | INTRAVENOUS | Status: AC
  Administered 2018-08-24: 30 mmol via INTRAVENOUS
  Filled 2018-08-24: qty 10

## 2018-08-24 NOTE — Progress Notes (Signed)
Spoke with CCM about patient's HR jumping consistently to the 170s despite metoprolol every three hours PRN. Stated that as long as it comes back down to the 130s they are okay with that as it seems the patient is withdrawing from home opiate medication for chronic back pain. Will continue to monitor closely. Huntley Estelle E, RN 08/24/2018 [phone]

## 2018-08-24 NOTE — Progress Notes (Signed)
CRITICAL VALUE ALERT  Critical Value:  Platelets 16  Date & Time Notied:  08/24/2018 0700  Provider Notified: Yes  Orders Received/Actions taken: Awaiting orders at this time.

## 2018-08-24 NOTE — Progress Notes (Signed)
NAME:  [name], MRN:  [id number], DOB:  09-24-1946, LOS: 3 ADMISSION DATE:  08/21/2018, CONSULTATION DATE:  1/14 REFERRING MD:  yates, CHIEF COMPLAINT:  Sepsis   Brief History   72 year old admitted 10/14  Found down w/ obstructive uropathy, left hydro, PNA and severe sepsis w/ resultant encephalopathy and AKI Recent MRSA skin infection treated by PCP  Past Medical History  Fibromyalgia, anemia, HTN, chronic pain on narcotics  Significant Hospital Events   Admitted 10/14 10/15 hypoglycemia  Consults: date of consult/date signed off & final recs:  PCCM 10/14 Urology  10/14 >> FU in clinic for stone removal  Procedures (surgical and bedside):  IR perc nstomy 10/15 >>  Significant Diagnostic Tests:  Ct ABD/PELVIS 10/14 LEFT HYDRO W15mm stone. Bilateral PNA   Micro Data:  UC 10/14>>>e coli S to cefazolin/ ceftx BCX2 10/14>>> e coli >> CTX sens  Antimicrobials:  Rocephin 1G 10/14>10/14 Azith X 1 zosyn 10/14 >>10/16 Rocephin 2G 10/16 >>  Subjective:  Afebrile Low dose Neo gtt being tapered UO improving Appears agitated, question as to delirium vs withdrawal from substantial chronic opioids  Objective   Blood pressure 123/80, pulse 90, temperature 97.7 F (36.5 C), temperature source Axillary, resp. rate (!) 34, height 5\' 5"  (1.651 m), weight 73.4 kg, SpO2 96 %.        Intake/Output Summary (Last 24 hours) at 08/24/2018 1122 Last data filed at 08/24/2018 1000 Gross per 24 hour  Intake 2000.38 ml  Output 1150 ml  Net 850.38 ml   Filed Weights   08/21/18 1800 08/23/18 0500 08/24/18 0600  Weight: 66 kg 66 kg 73.4 kg    Examination: Elderly woman, frail, slightly agitated Follows simple commands (stick out toungue), some mumbling but not reliably verbal Moist mucous membranes 2+ edema lower arms and legs Decreased breath sounds bilaterally Soft belly S1/s2 normal  Resolved Hospital Problem list    Lactic acidosis   Assessment & Plan:    E coli  septic shock due to  UTI source w/ left hydro H/o MRSA skin infection treated with doxy as putpt Plan Taper neo gtt Cont ceftriaxone  Acute hypoxic respiratory failure w/ bibasilar PNA, improving -could be aspiration vs evolving ALI Chest x-ray with slight worse infiltrates left lower lobe and right upper lobe Plan   Oxygen, 2L  IS Hopeful to avoid intubation here Will add lasix  AKI in setting of left hydro/obstructive uropathy w/ 6 mm stone, improving sCR S/p  perc drain Plan IVF KVO F/u serial chemistries.  Will add lasix  Hypokalemia Plan Replaced w/ kphos  Severe thrombocytopenia-admitted with 50 K, likely related to sepsis -no heparin for now, use SCDs  Chronic back pain walks w/ cane  Plan  fent 25- 50 mcg q 2h prn Resume oxycodone 5mg  q 6h when able to take PO (home dose 90 mg/d) Hold MSIR until renal function improved  Acute metabolic Encephalopathy due to sepsis and acute renal failure. Likely a toxic component as well d/t medications Plan Treat sepsis Hold all sedating meds PT consult  Disposition / Summary of Today's Plan 08/24/18   Still with hypoxua but w/ reduced O2 supplementation required Improving AKI/sepsis  Delirium vs opioid withdrawal agitation still present but mild Will need diuresis, starting lasix     Code Status: full code  Family Communication: Husband, daughter/son  Labs   CBC: Recent Labs  Lab 08/21/18 1057 08/22/18 0612 08/22/18 1230 08/23/18 0034 08/23/18 0816 08/24/18 0252  WBC 11.3* 10.2 3.7*  19.8* 15.4* 22.8*  NEUTROABS 10.0*  --   --   --  14.1*  --   HGB 10.1* 9.9* 10.4* 10.3* 12.0 11.7*  HCT 32.0* 30.3* 33.4* 32.6* 39.2 34.0*  MCV 94.1 92.7 94.6 93.9 99.0 87.9  PLT 50* 33* PLATELET CLUMPS NOTED ON SMEAR, UNABLE TO ESTIMATE 24* 27* 16*    Basic Metabolic Panel: Recent Labs  Lab 08/21/18 1815 08/21/18 2014 08/22/18 0016 08/22/18 1230 08/23/18 0034 08/24/18 0252  NA  --  136 138 139 140 139  K  --   3.4* 3.1* 3.0* 3.7 3.4*  CL  --  106 106 111 111 113*  CO2  --  20* 21* 17* 18* 17*  GLUCOSE  --  128* 108* 99 114* 108*  BUN  --  49* 51* 51* 51* 48*  CREATININE  --  2.49* 2.46* 2.36* 2.27* 1.98*  CALCIUM  --  7.1* 7.5* 7.9* 8.0* 8.2*  MG 1.6*  --   --   --  2.0 1.9  PHOS  --   --  3.8  --  2.8 2.6   GFR: Estimated Creatinine Clearance: 26.5 mL/min (A) (by C-G formula based on SCr of 1.98 mg/dL (H)). Recent Labs  Lab 08/21/18 1139 08/21/18 1254 08/21/18 1815 08/21/18 2116 08/22/18 0612 08/22/18 1230 08/23/18 0034 08/23/18 0816 08/24/18 0252  PROCALCITON  --   --  53.79  --  46.38  --  31.33  --   --   WBC  --   --   --   --  10.2 3.7* 19.8* 15.4* 22.8*  LATICACIDVEN 3.09* 2.35* 3.5* 2.0*  --   --   --   --   --     Liver Function Tests: Recent Labs  Lab 08/21/18 1057 08/22/18 0016  AST 51*  --   ALT 16  --   ALKPHOS 58  --   BILITOT 0.8  --   PROT 4.1*  --   ALBUMIN 2.0* 2.2*   No results for input(s): LIPASE, AMYLASE in the last 168 hours. No results for input(s): AMMONIA in the last 168 hours.  ABG    Component Value Date/Time   TCO2 26 10/02/2013 1317     Coagulation Profile: Recent Labs  Lab 08/21/18 1815 08/22/18 0016  INR 1.87 1.80    Cardiac Enzymes: Recent Labs  Lab 08/21/18 1815 08/22/18 0016 08/22/18 0612  TROPONINI 0.05* 0.05* 0.04*    HbA1C: Hgb A1c MFr Bld  Date/Time Value Ref Range Status  04/22/2014 11:53 AM 5.3 4.6 - 6.5 % Final    Comment:    Glycemic Control Guidelines for People with Diabetes:Non Diabetic:  <6%Goal of Therapy: <7%Additional Action Suggested:  >8%     CBG: Recent Labs  Lab 08/23/18 1951 08/23/18 2352 08/24/18 0418 08/24/18 0759 08/24/18 1113  GLUCAP 116* 95 78 76 111*   Marthenia Rolling, DO  PGY2 [place] 08/24/2018 11:22 AM    08/24/2018

## 2018-08-24 NOTE — Progress Notes (Signed)
Referring Physician(s): Dr. Marlou Porch  Supervising Physician: Gilmer Mor  Patient Status:  [facility] - In-pt  Chief Complaint: Obstructing stone s/p left nephrostomy tube placement  Subjective: Arouses and swats at me when touched, but does not open eyes or speak.   Allergies: Patient has no known allergies.  Medications: Prior to Admission medications   Medication Sig Start Date End Date Taking? Authorizing Provider  amitriptyline (ELAVIL) 75 MG tablet Take 75 mg by mouth at bedtime. 08/10/18  Yes [provider]  amLODipine (NORVASC) 5 MG tablet Take 1 tablet (5 mg total) by mouth daily. 08/10/18  Yes Lars Masson, MD  amphetamine-dextroamphetamine (ADDERALL XR) 20 MG 24 hr capsule 2 po qam Patient taking differently: Take 20 mg by mouth daily.  08/11/18  Yes Wendling, Jilda Roche, DO  baclofen (LIORESAL) 10 MG tablet Take 10 mg by mouth 3 (three) times daily. 06/12/18  Yes [provider]  clonazePAM (KLONOPIN) 0.5 MG tablet Take 0.5 tablets (0.25 mg total) by mouth daily as needed for anxiety. 08/11/18  Yes Wendling, Jilda Roche, DO  Cyanocobalamin (VITAMIN B-12) 1000 MCG SUBL Place 10,000 mcg under the tongue daily at 12 noon.   Yes [provider]  ibuprofen (ADVIL,MOTRIN) 200 MG tablet Take 200 mg by mouth as needed.   Yes [provider]  morphine (MSIR) 15 MG tablet Take 15 mg by mouth every 12 (twelve) hours as needed for severe pain (for pain).   Yes [provider]  Oxycodone HCl 20 MG TABS Take 1 tablet by mouth 4 (four) times daily as needed (pain).  03/06/15  Yes [provider]  SUMAtriptan (IMITREX) 50 MG tablet Take 1 tablet (50 mg total) by mouth every 2 (two) hours as needed for migraine. May repeat in 2 hours if headache persists or recurs. 04/12/18  Yes [name] R, DO  venlafaxine XR (EFFEXOR-XR) 75 MG 24 hr capsule Take 3 capsules (225 mg total) by mouth daily. 02/07/18  Yes Donato Schultz, DO   doxycycline (VIBRA-TABS) 100 MG tablet Take 1 tablet (100 mg total) by mouth 2 (two) times daily. Patient not taking: Reported on 08/21/2018 07/13/18   Zola Button, Grayling Congress, DO  omeprazole (PRILOSEC) 20 MG capsule Take 1 capsule (20 mg total) by mouth daily. Patient not taking: Reported on 08/21/2018 02/07/18   Zola Button, Grayling Congress, DO     Vital Signs: BP 106/81   Pulse 89   Temp 97.7 F (36.5 C) (Axillary)   Resp (!) 24   Ht 5\' 5"  (1.651 m)   Wt 161 lb 13.1 oz (73.4 kg)   SpO2 98%   BMI 26.93 kg/m   Physical Exam  NAD, lethargic, not responsive although will arouse Abdomen/Back: Left nephrostomy tube in place.  Bloody urine output with some clot in collection bag.  Unable to assess insertion site as patient is resistant to exam.   Imaging: Ct Abdomen Pelvis Wo Contrast  Result Date: 08/21/2018 CLINICAL DATA:  Fall yesterday.  Abdominal pain. EXAM: CT ABDOMEN AND PELVIS WITHOUT CONTRAST TECHNIQUE: Multidetector CT imaging of the abdomen and pelvis was performed following the standard protocol without IV contrast. COMPARISON:  07/17/2017. FINDINGS: Lower chest: Bibasilar dependent atelectasis. Heart size normal. No pericardial or pleural effusion. Hepatobiliary: Liver is grossly unremarkable. Gallbladder is distended. No definite biliary ductal dilatation. Pancreas: Grossly unremarkable. Spleen: Negative. Adrenals/Urinary Tract: Adrenal glands and right kidney are grossly unremarkable. Moderate left hydronephrosis secondary to a 6 mm stone at the  left ureteropelvic junction. Air is seen in the bladder and is presumably iatrogenic. Stomach/Bowel: Postoperative changes involving the stomach. Stomach and small bowel are otherwise grossly unremarkable. Fair amount of stool is seen in the colon, indicative of constipation. Vascular/Lymphatic: Atherosclerotic calcification of the arterial vasculature without abdominal aortic aneurysm. No definite pathologically enlarged lymph nodes.  Reproductive: No adnexal mass. Other: No free fluid. Mesenteries and peritoneum are grossly unremarkable. Musculoskeletal: Degenerative changes in the spine. No worrisome lytic or sclerotic lesions. Spinal stimulator wires are seen in the lower thoracic spine. Thoracolumbar compression deformities with L1 vertebral body augmentation. IMPRESSION: 1. Moderate left hydronephrosis secondary to a 6 mm stone at the left ureteropelvic junction. 2. Stool throughout the colon is indicative of constipation. 3. Gallbladder is distended. Please correlate for right upper quadrant symptoms. 4.  Aortic atherosclerosis (ICD10-170.0). Electronically Signed   By: Leanna Battles M.D.   On: 08/21/2018 14:22   Ct Head Wo Contrast  Result Date: 08/21/2018 CLINICAL DATA:  Recent fall with head injury, initial encounter EXAM: CT HEAD WITHOUT CONTRAST CT CERVICAL SPINE WITHOUT CONTRAST TECHNIQUE: Multidetector CT imaging of the head and cervical spine was performed following the standard protocol without intravenous contrast. Multiplanar CT image reconstructions of the cervical spine were also generated. COMPARISON:  None. FINDINGS: CT HEAD FINDINGS Brain: Mild atrophic changes are noted. No findings to suggest acute hemorrhage, acute infarction or space-occupying mass lesion are seen. Mild chronic white matter ischemic changes are noted as well. Vascular: No hyperdense vessel or unexpected calcification. Skull: Normal. Negative for fracture or focal lesion. Sinuses/Orbits: No acute finding. Other: None. CT CERVICAL SPINE FINDINGS Alignment: Alignment is well maintained. Skull base and vertebrae: 7 cervical segments are well visualized. Vertebral body height is well maintained. Multilevel facet hypertrophic changes are noted. No acute fracture or acute facet abnormality is seen. Multilevel neural foraminal changes are noted. Soft tissues and spinal canal: Surrounding soft tissues are within normal limits. Upper chest: Scarring is  noted in the left apex. No other focal abnormality is noted. Other: None IMPRESSION: CT of the head: Chronic atrophic and ischemic changes without acute abnormality. CT of the cervical spine: Multilevel degenerative change without acute abnormality. Electronically Signed   By: Alcide Clever M.D.   On: 08/21/2018 12:25   Ct Cervical Spine Wo Contrast  Result Date: 08/21/2018 CLINICAL DATA:  Recent fall with head injury, initial encounter EXAM: CT HEAD WITHOUT CONTRAST CT CERVICAL SPINE WITHOUT CONTRAST TECHNIQUE: Multidetector CT imaging of the head and cervical spine was performed following the standard protocol without intravenous contrast. Multiplanar CT image reconstructions of the cervical spine were also generated. COMPARISON:  None. FINDINGS: CT HEAD FINDINGS Brain: Mild atrophic changes are noted. No findings to suggest acute hemorrhage, acute infarction or space-occupying mass lesion are seen. Mild chronic white matter ischemic changes are noted as well. Vascular: No hyperdense vessel or unexpected calcification. Skull: Normal. Negative for fracture or focal lesion. Sinuses/Orbits: No acute finding. Other: None. CT CERVICAL SPINE FINDINGS Alignment: Alignment is well maintained. Skull base and vertebrae: 7 cervical segments are well visualized. Vertebral body height is well maintained. Multilevel facet hypertrophic changes are noted. No acute fracture or acute facet abnormality is seen. Multilevel neural foraminal changes are noted. Soft tissues and spinal canal: Surrounding soft tissues are within normal limits. Upper chest: Scarring is noted in the left apex. No other focal abnormality is noted. Other: None IMPRESSION: CT of the head: Chronic atrophic and ischemic changes without acute abnormality. CT of the cervical spine:  Multilevel degenerative change without acute abnormality. Electronically Signed   By: Alcide Clever M.D.   On: 08/21/2018 12:25   Dg Pelvis Portable  Result Date:  08/21/2018 CLINICAL DATA:  Pelvic pain EXAM: PORTABLE PELVIS 1-2 VIEWS COMPARISON:  None. FINDINGS: Pelvic ring is intact. No acute fracture is noted. Mild degenerative changes of the hip joints are seen. No soft tissue changes are noted. IMPRESSION: Mild degenerative changes.  No acute abnormality seen. Electronically Signed   By: Alcide Clever M.D.   On: 08/21/2018 11:20   Ct L-spine No Charge  Result Date: 08/21/2018 CLINICAL DATA:  Back pain.  Fall.  Prior lumbar fractures. EXAM: CT LUMBAR SPINE WITHOUT CONTRAST TECHNIQUE: Multidetector CT imaging of the lumbar spine was performed without intravenous contrast administration. Multiplanar CT image reconstructions were also generated. COMPARISON:  CT lumbar myelogram 08/08/2017 FINDINGS: Segmentation: Normal Alignment: Mild anterolisthesis T11-T12, L1-2, L2-3, L3-4, L5-S1 Vertebrae: Chronic severe compression fracture T12 unchanged Chronic compression fracture L1 with interval cement vertebroplasty. No further fracture. Moderate to severe chronic compression fracture of L2 unchanged Mild superior endplate fracture of L3 unchanged Moderate compression fracture of L4 is new. Horizontal fracture line with sclerotic margins suggesting a subacute fracture with partial healing. This was not present previously. Moderate dextroscoliosis.  Negative for sacral fracture. Paraspinal and other soft tissues: Mild atherosclerotic aorta. No paraspinous soft tissue mass. Disc levels: T12-L1: Negative for stenosis L1-2: Negative for stenosis L2-3: Moderate right foraminal stenosis due to facet hypertrophy. C3-4: Mild facet degeneration without stenosis L4-5: Moderate right foraminal encroachment due to marked facet hypertrophy. Mild spinal stenosis L5-S1: Moderate facet degeneration bilaterally with subarticular stenosis bilaterally. IMPRESSION: Chronic compression fractures of T12, L1, L2, and L3 Moderate compression of fracture of L4 not present on 08/08/2017. This may be a  subacute fracture given the sclerotic margins to the fracture. No retropulsion of bone and no spinal stenosis Multilevel degenerative change throughout the lumbar spine appears chronic. Electronically Signed   By: Marlan Palau M.D.   On: 08/21/2018 14:36   Dg Chest Port 1 View  Result Date: 08/24/2018 CLINICAL DATA:  Sepsis and pneumonia. EXAM: PORTABLE CHEST 1 VIEW COMPARISON:  08/23/2018 FINDINGS: The heart size and mediastinal contours are within normal limits. Persistent low bilateral lung volumes. Airspace disease of the right lung appears slightly more dense and is consistent with pneumonia. Left basilar opacity stable to slightly improved and consistent with atelectasis versus pneumonia. There may be small bilateral pleural effusions. No pneumothorax. Stable heart size and appearance of spinal stimulator. IMPRESSION: Slightly more dense appearance right lung pneumonia. Left basilar atelectasis versus pneumonia is stable to slightly improved. Electronically Signed   By: Irish Lack M.D.   On: 08/24/2018 09:20   Dg Chest Port 1 View  Result Date: 08/23/2018 CLINICAL DATA:  Acute respiratory failure. EXAM: PORTABLE CHEST 1 VIEW COMPARISON:  Radiograph of August 22, 2018. FINDINGS: Stable cardiomegaly. Hypoinflation of the lungs is noted with mild bibasilar subsegmental atelectasis, left greater than right. Increased right upper lobe airspace opacity is noted concerning for possible pneumonia. Bony thorax is unremarkable. No pneumothorax is noted. IMPRESSION: Stable hypoinflation of the lungs with mild bibasilar subsegmental atelectasis. Increased right upper lobe airspace opacity is noted concerning for pneumonia. Electronically Signed   By: Lupita Raider, M.D.   On: 08/23/2018 10:02   Dg Chest Port 1 View  Result Date: 08/22/2018 CLINICAL DATA:  Pneumonia. Shortness of breath. EXAM: PORTABLE CHEST 1 VIEW COMPARISON:  08/21/2018 FINDINGS: The cardiomediastinal silhouette is  unchanged. The  lungs are hypoinflated with lung volumes being slightly lower than on the prior study. Patchy left greater than right basilar opacities have increased. No sizable pleural effusion or pneumothorax is identified. IMPRESSION: Low lung volumes with increased left greater than right basilar opacities which may reflect atelectasis or pneumonia. Electronically Signed   By: Sebastian Ache M.D.   On: 08/22/2018 08:37   Dg Chest Portable 1 View  Result Date: 08/21/2018 CLINICAL DATA:  Altered mental status EXAM: PORTABLE CHEST 1 VIEW COMPARISON:  10/11/2016 FINDINGS: Cardiac shadows within normal limits. Spinal stimulator is noted midthoracic spine. Overall inspiratory effort is poor. Patchy infiltrative changes are noted in the left base new from the prior exam. No bony abnormality is seen. IMPRESSION: New left basilar infiltrate. Electronically Signed   By: Alcide Clever M.D.   On: 08/21/2018 11:16   Ir Nephrostomy Placement Left  Result Date: 08/22/2018 CLINICAL DATA:  Left-sided hydronephrosis and sepsis secondary to obstructing calculus in the proximal ureter. Left percutaneous nephrostomy tube has been requested to decompress the obstructed left kidney. EXAM: 1. ULTRASOUND GUIDANCE FOR PUNCTURE OF THE LEFT RENAL COLLECTING SYSTEM. 2. LEFT PERCUTANEOUS NEPHROSTOMY TUBE PLACEMENT. COMPARISON:  CT of the abdomen and pelvis on 08/21/2018 ANESTHESIA/SEDATION: 0.5 mg IV Versed Total Moderate Sedation Time 13 minutes. The patient's level of consciousness and physiologic status were continuously monitored during the procedure by Radiology nursing. CONTRAST:  10 ml Omnipaque 300 injected into to the collecting system MEDICATIONS: No additional medications. FLUOROSCOPY TIME:  1.0 minutes.  15.1 mGy. PROCEDURE: The procedure, risks, benefits, and alternatives were explained to the patient's husband. Questions regarding the procedure were encouraged and answered. The patient's husband understands and consents to the  procedure. A time-out was performed prior to initiating the procedure. The left flank region was prepped with Betadine in a sterile fashion, and a sterile drape was applied covering the operative field. A sterile gown and sterile gloves were used for the procedure. Local anesthesia was provided with 1% Lidocaine. Ultrasound was used to localize the left kidney. Under direct ultrasound guidance, a 21 gauge needle was advanced into the renal collecting system. Ultrasound image documentation was performed. Aspiration of urine sample was performed followed by contrast injection. A transitional dilator was advanced over a guidewire. Percutaneous tract dilatation was then performed over the guidewire. A 10-French percutaneous nephrostomy tube was then advanced and formed in the collecting system. Catheter position was confirmed by fluoroscopy after contrast injection. The catheter was secured at the skin with a Prolene retention suture and Stat-Lock device. A gravity bag was placed. COMPLICATIONS: None. FINDINGS: Ultrasound confirms moderate left hydronephrosis. A 10 French nephrostomy tube was placed and formed in the renal pelvis. There is return bloody urine after tube placement. A urine sample was sent for culture analysis. IMPRESSION: Placement of 10 French left percutaneous nephrostomy tube. The tube will be left to gravity bag drainage. A urine sample aspirated from the renal pelvis was sent for culture analysis. Electronically Signed   By: Irish Lack M.D.   On: 08/22/2018 10:49    Labs:  CBC: Recent Labs    08/22/18 1230 08/23/18 0034 08/23/18 0816 08/24/18 0252  WBC 3.7* 19.8* 15.4* 22.8*  HGB 10.4* 10.3* 12.0 11.7*  HCT 33.4* 32.6* 39.2 34.0*  PLT PLATELET CLUMPS NOTED ON SMEAR, UNABLE TO ESTIMATE 24* 27* 16*    COAGS: Recent Labs    08/21/18 1815 08/22/18 0016  INR 1.87 1.80    BMP: Recent Labs  08/22/18 0016 08/22/18 1230 08/23/18 0034 08/24/18 0252  NA 138 139 140 139   K 3.1* 3.0* 3.7 3.4*  CL 106 111 111 113*  CO2 21* 17* 18* 17*  GLUCOSE 108* 99 114* 108*  BUN 51* 51* 51* 48*  CALCIUM 7.5* 7.9* 8.0* 8.2*  CREATININE 2.46* 2.36* 2.27* 1.98*  GFRNONAA 19* 20* 21* 24*  GFRAA 22* 23* 24* 28*    LIVER FUNCTION TESTS: Recent Labs    08/21/18 1057 08/22/18 0016  BILITOT 0.8  --   AST 51*  --   ALT 16  --   ALKPHOS 58  --   PROT 4.1*  --   ALBUMIN 2.0* 2.2*    Assessment and Plan: Left hydronephrosis s/p percutaneous left nephrostomy tube placement by Dr. Archer Asa 08/23/18 Patient somnolent, but arouses when touched.  Drain remains in place.  Unable to assess insertion site due to patient agitation, however bloody urine noted in collection bag.  Continue current management.  IR to follow.  Electronically Signed: Hoyt Koch, PA 08/24/2018, 2:39 PM   I spent a total of 15 Minutes at the the patient's bedside AND on the patient's hospital floor or unit, greater than 50% of which was counseling/coordinating care for left hydronephrosis.

## 2018-08-24 NOTE — Consult Note (Addendum)
WOC Nurse wound consult note Reason for Consult: Consult requested for sacrum and bilat heels. Wound type: Sacrum with dark reddish purple deep tissue injury; 4X4cm Left heel with dark reddish purple deep tissue injury, .2X.2cm; also same size and appearance to patchy areas on anterior and outer foot areas; location and shape are consistent with patient wearing a shoe or slipper previously which was too tight at the top boarder. Right heel with dark reddish purple deep tissue injury, 4X4cm intact dark red fluid filled blister, right anterior foot with .2X.2cm dark reddish purple deep tissue injury.    Pressure Injury POA: Yes Dressing procedure/placement/frequency: Pt is on a low airloss bed to reduce pressure.  Foam dressings are in place to protect from further injury to bilat heels and sacrum.  Prevalon boots to reduce pressure. No family members present to discuss plan of care. Please re-consult if further assistance is needed.  Thank-you,  Cammie Mcgee MSN, RN, CWOCN, [place], CNS [phone]

## 2018-08-24 NOTE — Progress Notes (Signed)
72 year old admitted 10/14  Found down w/ obstructive uropathy, left hydro, PNA and  Septic shock w/ resultant encephalopathy and AKI Recent MRSA skin infection treated by PCP  She has improved somewhat, off pressors and lower oxygen requirements.  She remains delirious and very fidgety this morning moving all 4 extremities, follows one-step commands and sticks out her tongue but does not recognize family at bedside.  Decreased breath sounds bilateral, soft and nontender abdomen, 2+ anasarca  Chest x-ray shows right-sided infiltrate, personally reviewed Labs show improving renal function with creatinine down to 1.9, mild hypokalemia , increasing leukocytosis and worsening thrombocytopenia.  Impression/plan  Acute metabolic encephalopathy-concern for opiate withdrawal, will use low-dose fentanyl judiciously while monitoring mental status. May add low-dose Precedex Eventually resume low-dose oxycodone when she is able to take orally. Hold MSIR for now  Acute hypoxic respiratory failure due to aspiration pneumonia-oxygen requirements have decreased.  Septic shock due to E. coli bacteremia-continue ceftriaxone, will eventually require 2 weeks of antibiotics  Atrial fibrillation/RVR-continue Lopressor but this seems to be driven by above.  Severe thrombocytopenia-transfuse if bleeding or if platelet count less than 10  Son and daughter updated at bedside.  My critical care time x 33 m  Cyril Mourning MD. FCCP. [facility] Pager [phone] If no response call [phone]   08/24/2018

## 2018-08-24 NOTE — Progress Notes (Signed)
PT Cancellation Note  Patient Details Name: [name] MRN: [id number] DOB: 01-24-1946   Cancelled Treatment:    Reason Eval/Treat Not Completed: Fatigue/lethargy limiting ability to participate per RN, patient just back to sleep after being agitated and withdrawing today. Requests we return tomorrow, will cont to follow.   Etta Grandchild, PT, DPT Acute Rehabilitation Services Pager: [phone] Office: [phone]     Etta Grandchild 08/24/2018, 2:40 PM

## 2018-08-25 LAB — BASIC METABOLIC PANEL
ANION GAP: 9 (ref 5–15)
Anion gap: 14 (ref 5–15)
BUN: 51 mg/dL — ABNORMAL HIGH (ref 8–23)
BUN: 48 mg/dL — ABNORMAL HIGH (ref 8–23)
CALCIUM: 8 mg/dL — AB (ref 8.9–10.3)
CALCIUM: 7.9 mg/dL — AB (ref 8.9–10.3)
CO2: 19 mmol/L — ABNORMAL LOW (ref 22–32)
CO2: 16 mmol/L — ABNORMAL LOW (ref 22–32)
CREATININE: 1.66 mg/dL — AB (ref 0.44–1.00)
Chloride: 107 mmol/L (ref 98–111)
Chloride: 104 mmol/L (ref 98–111)
Creatinine, Ser: 1.84 mg/dL — ABNORMAL HIGH (ref 0.44–1.00)
GFR, EST AFRICAN AMERICAN: 31 mL/min — AB (ref >60)
GFR, EST AFRICAN AMERICAN: 35 mL/min — AB (ref >60)
GFR, EST NON AFRICAN AMERICAN: 27 mL/min — AB (ref >60)
GFR, EST NON AFRICAN AMERICAN: 30 mL/min — AB (ref >60)
Glucose, Bld: 112 mg/dL — ABNORMAL HIGH (ref 70–99)
Glucose, Bld: 87 mg/dL (ref 70–99)
POTASSIUM: 2 mmol/L — AB (ref 3.5–5.1)
Potassium: 4.7 mmol/L (ref 3.5–5.1)
Sodium: 135 mmol/L (ref 135–145)
Sodium: 134 mmol/L — ABNORMAL LOW (ref 135–145)

## 2018-08-25 LAB — GLUCOSE, CAPILLARY
GLUCOSE-CAPILLARY: 86 mg/dL (ref 70–99)
GLUCOSE-CAPILLARY: 88 mg/dL (ref 70–99)
GLUCOSE-CAPILLARY: 64 mg/dL — AB (ref 70–99)
Glucose-Capillary: 105 mg/dL — ABNORMAL HIGH (ref 70–99)
Glucose-Capillary: 68 mg/dL — ABNORMAL LOW (ref 70–99)
Glucose-Capillary: 75 mg/dL (ref 70–99)
Glucose-Capillary: 81 mg/dL (ref 70–99)
Glucose-Capillary: 96 mg/dL (ref 70–99)

## 2018-08-25 LAB — CBC
HCT: 30.2 % — ABNORMAL LOW (ref 36.0–46.0)
Hemoglobin: 10.3 g/dL — ABNORMAL LOW (ref 12.0–15.0)
MCH: 29.5 pg (ref 26.0–34.0)
MCHC: 34.1 g/dL (ref 30.0–36.0)
MCV: 86.5 fL (ref 80.0–100.0)
NRBC: 0 % (ref 0.0–0.2)
PLATELETS: 16 10*3/uL — AB (ref 150–400)
RBC: 3.49 MIL/uL — AB (ref 3.87–5.11)
RDW: 13.9 % (ref 11.5–15.5)
WBC: 16.3 10*3/uL — ABNORMAL HIGH (ref 4.0–10.5)

## 2018-08-25 LAB — PHOSPHORUS: Phosphorus: 3.8 mg/dL (ref 2.5–4.6)

## 2018-08-25 LAB — MAGNESIUM: MAGNESIUM: 1.5 mg/dL — AB (ref 1.7–2.4)

## 2018-08-25 MED ORDER — POTASSIUM CHLORIDE 20 MEQ/15ML (10%) PO SOLN
40.0000 meq | Freq: Once | ORAL | Status: AC
  Administered 2018-08-25: 40 meq via ORAL
  Filled 2018-08-25: qty 30

## 2018-08-25 MED ORDER — INFLUENZA VAC SPLIT HIGH-DOSE 0.5 ML IM SUSY
0.5000 mL | PREFILLED_SYRINGE | INTRAMUSCULAR | Status: AC
  Administered 2018-08-26: 0.5 mL via INTRAMUSCULAR
  Filled 2018-08-25: qty 0.5

## 2018-08-25 MED ORDER — DEXTROSE 50 % IV SOLN
INTRAVENOUS | Status: AC
  Administered 2018-08-25: 25 mL
  Filled 2018-08-25: qty 50

## 2018-08-25 MED ORDER — POTASSIUM CHLORIDE 10 MEQ/100ML IV SOLN
10.0000 meq | INTRAVENOUS | Status: AC
  Administered 2018-08-25 (×6): 10 meq via INTRAVENOUS
  Filled 2018-08-25 (×6): qty 100

## 2018-08-25 MED ORDER — MAGNESIUM SULFATE 4 GM/100ML IV SOLN
4.0000 g | Freq: Once | INTRAVENOUS | Status: AC
  Administered 2018-08-25: 4 g via INTRAVENOUS
  Filled 2018-08-25: qty 100

## 2018-08-25 MED ORDER — GLUCOSE 4 G PO CHEW
CHEWABLE_TABLET | ORAL | Status: AC
  Administered 2018-08-25: 1
  Filled 2018-08-25: qty 3

## 2018-08-25 NOTE — Evaluation (Signed)
Physical Therapy Evaluation Patient Details Name: [name] MRN: [id number] DOB: 03/10/1946 Today's Date: 08/25/2018   History of Present Illness  72 year old admitted 10/14 Found down w/ obstructive uropathy, left hydro, PNA and severe sepsis w/ resultant encephalopathy and AKI. PMH includes but not limited to: Chronic back pain, congestive heart disease,  HTN, HLD, Anxiety, ADD, spinal cord stimulator implant, back surgery.     Clinical Impression  Pt admitted with above diagnosis. Pt currently with functional limitations due to the deficits listed below (see PT Problem List). Pt AO to person and place only, following commands 70% time with increased time. Unreliable historian, reports she lives with husband and walks without AD, although skin breakdown would suggest she is quite sedentary. Upon eval pt requires max A of two people to bring to sitting and return to supine, unable to attempt standing this visit.  Pt will benefit from skilled PT to increase their independence and safety with mobility to allow discharge to the venue listed below.       Follow Up Recommendations SNF    Equipment Recommendations  (TBD)    Recommendations for Other Services       Precautions / Restrictions Precautions Precautions: Fall Restrictions Weight Bearing Restrictions: No      Mobility  Bed Mobility Overal bed mobility: Needs Assistance Bed Mobility: Supine to Sit;Sit to Supine     Supine to sit: Max assist Sit to supine: Max assist   General bed mobility comments: Max x2 patient with R lean noted.   Transfers                    Ambulation/Gait                Stairs            Wheelchair Mobility    Modified Rankin (Stroke Patients Only)       Balance                                             Pertinent Vitals/Pain Pain Assessment: Faces Faces Pain Scale: Hurts even more Pain Location: right arm Pain Descriptors /  Indicators: Aching Pain Intervention(s): Monitored during session    Home Living Family/patient expects to be discharged to:: Skilled nursing facility                      Prior Function           Comments: unreliable historian, due to skin breakdown appears sedentary      Hand Dominance        Extremity/Trunk Assessment   Upper Extremity Assessment Upper Extremity Assessment: Generalized weakness    Lower Extremity Assessment Lower Extremity Assessment: Generalized weakness    Cervical / Trunk Assessment Cervical / Trunk Assessment: Normal  Communication   Communication: No difficulties  Cognition Arousal/Alertness: Awake/alert Behavior During Therapy: WFL for tasks assessed/performed                                   General Comments: AO to person and place only      General Comments      Exercises     Assessment/Plan    PT Assessment Patient needs continued PT services  PT Problem List Decreased mobility;Decreased safety  awareness;Decreased cognition       PT Treatment Interventions DME instruction;Gait training;Stair training;Functional mobility training;Therapeutic activities;Therapeutic exercise;Balance training    PT Goals (Current goals can be found in the Care Plan section)  Acute Rehab PT Goals Patient Stated Goal: non stated PT Goal Formulation: With patient Time For Goal Achievement: 09/08/18 Potential to Achieve Goals: Fair    Frequency Min 2X/week   Barriers to discharge        Co-evaluation               AM-PAC PT "6 Clicks" Daily Activity  Outcome Measure Difficulty turning over in bed (including adjusting bedclothes, sheets and blankets)?: Unable Difficulty moving from lying on back to sitting on the side of the bed? : Unable Difficulty sitting down on and standing up from a chair with arms (e.g., wheelchair, bedside commode, etc,.)?: Unable Help needed moving to and from a bed to chair  (including a wheelchair)?: Total Help needed walking in hospital room?: Total Help needed climbing 3-5 steps with a railing? : Total 6 Click Score: 6    End of Session Equipment Utilized During Treatment: Gait belt Activity Tolerance: Patient limited by fatigue Patient left: in bed;with call bell/phone within reach;with bed alarm set Nurse Communication: Mobility status PT Visit Diagnosis: Unsteadiness on feet (R26.81)    Time: 1610-9604 PT Time Calculation (min) (ACUTE ONLY): 29 min   Charges:   PT Evaluation $PT Eval Moderate Complexity: 1 Mod PT Treatments $Therapeutic Activity: 8-22 mins        Etta Grandchild, PT, DPT Acute Rehabilitation Services Pager: [phone] Office: [phone]    Etta Grandchild 08/25/2018, 6:00 PM

## 2018-08-25 NOTE — Progress Notes (Signed)
CRITICAL VALUE ALERT  Critical Value:  Potassium 2.0  Date & Time Notified:  08/25/2018 06:08AM  Provider Notified: Guy Begin, RN  Orders Received/Actions taken: Awaiting new orders. Will continue to monitor. Caswell Corwin, RN 08/25/18 6:09 AM

## 2018-08-25 NOTE — Progress Notes (Signed)
72 year old admitted 10/14 Found down w/ obstructive uropathy, left hydro, PNA and  Septic shock w/ resultant encephalopathy and AKI Recent MRSA skin infection treated by PCP  Blood pressure is improved, she remains delirious but is calmer this morning and follows one-step commands, able to tell me her name and address does not know that she is in the hospital.  Decreased breath sounds bilateral, soft and nontender abdomen, 1+ anasarca She is off oxygen now  Labs reviewed which show potassium of 2.0 and magnesium of 1.5, creatinine decreasing to 1.8, severe thrombocytopenia and decreasing leukocytosis.  Impression/plan  Acute metabolic encephalopathy-related to opiate withdrawal, she was placed on Precedex overnight but this did not seem to work, will discontinue. Continue intermittent fentanyl judiciously while monitoring mental status. Resume low-dose oxycodone if she is able to swallow. Swallow study and PT consult.  AKI -good response to Lasix, improving, will replete severe hypokalemia and hypomagnesemia and recheck before more Lasix today   Septic shock due to E. coli bacteremia-continue ceftriaxone, will need total 2 weeks of antibiotics.  Acute hypoxic respiratory failure due to aspiration pneumonia-improved.  Severe thrombocytopenia-some blood in nephrostomy bag but hemoglobin appears stable, will transfuse if less than 10 or if bleeding significant.  Atrial fibrillation/RVR-use Lopressor as needed for rate control  Family updated  My critical care time  X 53m  Cyril Mourning MD. FCCP. [facility] Pager [phone] If no response call [phone]   08/25/2018

## 2018-08-25 NOTE — Evaluation (Signed)
Clinical/Bedside Swallow Evaluation Patient Details  Name: [name] MRN: [id number] Date of Birth: 1945/12/25  Today's Date: 08/25/2018 Time: SLP Start Time (ACUTE ONLY): 1205 SLP Stop Time (ACUTE ONLY): 1235 SLP Time Calculation (min) (ACUTE ONLY): 30 min  Past Medical History:  Past Medical History:  Diagnosis Date  . ADD (attention deficit disorder) 05/24/2012  . ANEMIA, MILD 06/26/2008   Qualifier: Diagnosis of  By: Janit Bern    . Anxiety   . Arthritis    "joints, back" (10/02/2013)  . Chronic back pain    "all over my back" (10/02/2013)  . Congestive heart disease (HCC)    "I have the beginning signs" (10/02/2013)  . Crushing injury of arm, right 02/06/1989's   "it was crushed; wore cast from fingers to top of my shoulder" (11/25/2  . Crushing injury of left wrist 05/11/1989's  . DIZZINESS 12/31/2009   Qualifier: Diagnosis of  By: Oswald Hillock    . Fibromyalgia   . GERD 12/03/2008   Qualifier: Diagnosis of  By: Freddy Jaksch    . HYPERLIPIDEMIA 05/15/2007   Qualifier: Diagnosis of  By: Janit Bern ; pt denies this hx on 10/02/2013  . Hypertension   . Idiopathic scoliosis 04/19/2013  . Migraines    "once/month" (10/02/2013)  . Osteoporosis, unspecified 04/19/2013  . Other vitamin B12 deficiency anemia 10/30/2009   Qualifier: Diagnosis of  By: Floydene Flock    . PALPITATIONS, RECURRENT 12/12/2009   Qualifier: Diagnosis of  By: Janit Bern    . Pica 12/28/2007   Qualifier: Diagnosis of  By: Janit Bern    . Pneumonia 10/02/2013   "had onsets of it before; not full blown til now" (10/02/2013)  . PSTPRC STATUS, BARIATRIC SURGERY 05/15/2007   Qualifier: Diagnosis of  By: Janit Bern    . Pulmonary nodules/lesions, multiple   . TREMOR, ESSENTIAL 12/12/2009   Qualifier: Diagnosis of  By: Janit Bern     Past Surgical History:  Past Surgical History:  Procedure Laterality Date  . ABDOMINAL HYSTERECTOMY  1980's  . APPENDECTOMY  1980's    "w/either my hysterectomy or myomectomy" (10/02/2013)  . BACK SURGERY    . CLOSED REDUCTION HAND FRACTURE Right 1990's   "it was crushed; wore cast from fingers to top of my shoulder" (10/02/2013)  . GASTRIC BYPASS  ~ 2003  . IR NEPHROSTOMY PLACEMENT LEFT  08/22/2018  . KNEE ARTHROSCOPY Right 2002  . MYOMECTOMY  1980's  . REDUCTION MAMMAPLASTY Bilateral 2001  . REFRACTIVE SURGERY Bilateral 2004  . SPINAL CORD STIMULATOR IMPLANT    . TONSILLECTOMY AND ADENOIDECTOMY  1955  . TUBAL LIGATION  1980's   HPI:  72 year old female admitted 08/21/18, found down with obstructive uropathy, left hydro, PNA and septic shock/encephalopathy and AKI. PMH: recent MRSA skin infection, fibromyalgia, anemia, HTN, chronic pain on narcotics. CXR = slightly more dense appearance of right lung PNA, LLL atelectasis vs PNA stable-slightly improved   Assessment / Plan / Recommendation Clinical Impression  Oral care was completed with suction. Pt is edentulous, and reports dentures are at home. She usually eats with them in place. Oral motor strength and function appear adequate. Pt accepted trials of ice chips, thin liquid, puree, and soft solid. All trials were tolerated well without oral deficits or overt s/s aspiration. Pt passed the 3oz water challenge without difficulty. She reports having poor appetite recently, and declined further po trials stating "I don't want it to make me sick". Pt  did exhibit belching throughout this evaluation, raising concern for esophageal issues. Will begin dys 2 diet and thin liquids, meds one at a time with liquid. Safe swallow precautions posted at [facility]. RN informed of results and recommendations. SLP will follow for diet tolerance assessment and education.     SLP Visit Diagnosis: Dysphagia, unspecified (R13.10)    Aspiration Risk  Mild aspiration risk    Diet Recommendation Dysphagia 2 (Fine chop);Thin liquid   Liquid Administration via: Cup;Straw Medication Administration:  Whole meds with liquid Supervision: Staff to assist with self feeding;Full supervision/cueing for compensatory strategies Compensations: Minimize environmental distractions;Slow rate;Small sips/bites Postural Changes: Seated upright at 90 degrees;Remain upright for at least 30 minutes after po intake    Other  Recommendations Oral Care Recommendations: Oral care QID   Follow up Recommendations (TBD)      Frequency and Duration min 2x/week  1 week;2 weeks       Prognosis Prognosis for Safe Diet Advancement: Fair Barriers to Reach Goals: Cognitive deficits      Swallow Study   General Date of Onset: 08/21/18 HPI: 72 year old female admitted 08/21/18, found down with obstructive uropathy, left hydro, PNA and septic shock/encephalopathy and AKI. PMH: recent MRSA skin infection, fibromyalgia, anemia, HTN, chronic pain on narcotics. CXR = slightly more dense appearance of right lung PNA, LLL atelectasis vs PNA stable-slightly improved Type of Study: Bedside Swallow Evaluation Previous Swallow Assessment: no prior ST Diet Prior to this Study: (clear liquids) Temperature Spikes Noted: No Respiratory Status: Room air History of Recent Intubation: No Behavior/Cognition: Alert;Cooperative Oral Cavity Assessment: Dry Oral Care Completed by SLP: Yes Oral Cavity - Dentition: Edentulous;Dentures, not available Vision: Functional for self-feeding Self-Feeding Abilities: Able to feed self Patient Positioning: Upright in bed Baseline Vocal Quality: Low vocal intensity Volitional Cough: Weak Volitional Swallow: Able to elicit    Oral/Motor/Sensory Function Overall Oral Motor/Sensory Function: Within functional limits   Ice Chips Ice chips: Within functional limits Presentation: Spoon   Thin Liquid Thin Liquid: Within functional limits Presentation: Straw    Nectar Thick Nectar Thick Liquid: Not tested   Honey Thick Honey Thick Liquid: Not tested   Puree Puree: Within functional  limits Presentation: Spoon   Solid     Solid: Within functional limits(graham cracker softened with applesauce)     Celia B. Murvin Natal, [facility], CCC-SLP Speech Language Pathologist [phone]  Leigh Aurora 08/25/2018,12:47 PM

## 2018-08-25 NOTE — Progress Notes (Signed)
eLink Physician-Brief Progress Note Patient Name: [name] DOB: 1946/06/30 MRN: [id number]   Date of Service  08/25/2018  HPI/Events of Note  Hypokalemia, hypomag  eICU Interventions  Potassium and mag replaced     Intervention Category Intermediate Interventions: Electrolyte abnormality - evaluation and management  DETERDING,ELIZABETH 08/25/2018, 6:29 AM

## 2018-08-26 ENCOUNTER — Inpatient Hospital Stay (HOSPITAL_COMMUNITY): Payer: Medicare Other

## 2018-08-26 LAB — PHOSPHORUS: PHOSPHORUS: 3.2 mg/dL (ref 2.5–4.6)

## 2018-08-26 LAB — DIC (DISSEMINATED INTRAVASCULAR COAGULATION)PANEL
D-Dimer, Quant: 6.69 ug/mL-FEU — ABNORMAL HIGH (ref 0.00–0.50)
Fibrinogen: 674 mg/dL — ABNORMAL HIGH (ref 210–475)
INR: 2.8
Platelets: 26 10*3/uL — CL (ref 150–400)
Prothrombin Time: 29.1 s — ABNORMAL HIGH (ref 11.4–15.2)
Smear Review: NONE SEEN
aPTT: 57 s — ABNORMAL HIGH (ref 24–36)

## 2018-08-26 LAB — BASIC METABOLIC PANEL
Anion gap: 13 (ref 5–15)
BUN: 45 mg/dL — ABNORMAL HIGH (ref 8–23)
CALCIUM: 7.8 mg/dL — AB (ref 8.9–10.3)
CO2: 17 mmol/L — AB (ref 22–32)
Chloride: 104 mmol/L (ref 98–111)
Creatinine, Ser: 1.5 mg/dL — ABNORMAL HIGH (ref 0.44–1.00)
GFR, EST AFRICAN AMERICAN: 40 mL/min — AB (ref >60)
GFR, EST NON AFRICAN AMERICAN: 34 mL/min — AB (ref >60)
Glucose, Bld: 97 mg/dL (ref 70–99)
POTASSIUM: 3.7 mmol/L (ref 3.5–5.1)
Sodium: 134 mmol/L — ABNORMAL LOW (ref 135–145)

## 2018-08-26 LAB — CBC
HCT: 35.7 % — ABNORMAL LOW (ref 36.0–46.0)
HEMOGLOBIN: 11.7 g/dL — AB (ref 12.0–15.0)
MCH: 28.7 pg (ref 26.0–34.0)
MCHC: 32.8 g/dL (ref 30.0–36.0)
MCV: 87.7 fL (ref 80.0–100.0)
PLATELETS: 22 10*3/uL — AB (ref 150–400)
RBC: 4.07 MIL/uL (ref 3.87–5.11)
RDW: 13.9 % (ref 11.5–15.5)
WBC: 15.4 10*3/uL — AB (ref 4.0–10.5)
nRBC: 0 % (ref 0.0–0.2)

## 2018-08-26 LAB — GLUCOSE, CAPILLARY
GLUCOSE-CAPILLARY: 94 mg/dL (ref 70–99)
GLUCOSE-CAPILLARY: 105 mg/dL — AB (ref 70–99)
Glucose-Capillary: 80 mg/dL (ref 70–99)
Glucose-Capillary: 57 mg/dL — ABNORMAL LOW (ref 70–99)
Glucose-Capillary: 116 mg/dL — ABNORMAL HIGH (ref 70–99)

## 2018-08-26 LAB — MAGNESIUM: Magnesium: 2.1 mg/dL (ref 1.7–2.4)

## 2018-08-26 MED ORDER — VENLAFAXINE HCL ER 75 MG PO CP24
225.0000 mg | ORAL_CAPSULE | Freq: Every day | ORAL | Status: DC
  Administered 2018-08-26 – 2018-08-30 (×5): 225 mg via ORAL
  Filled 2018-08-26 (×3): qty 3
  Filled 2018-08-26: qty 1
  Filled 2018-08-26 (×2): qty 3

## 2018-08-26 MED ORDER — AMPHETAMINE-DEXTROAMPHET ER 10 MG PO CP24
20.0000 mg | ORAL_CAPSULE | Freq: Every day | ORAL | Status: DC
  Administered 2018-08-26 – 2018-09-01 (×7): 20 mg via ORAL
  Filled 2018-08-26 (×7): qty 2

## 2018-08-26 MED ORDER — FUROSEMIDE 10 MG/ML IJ SOLN
40.0000 mg | Freq: Once | INTRAMUSCULAR | Status: AC
  Administered 2018-08-26: 40 mg via INTRAVENOUS
  Filled 2018-08-26: qty 4

## 2018-08-26 MED ORDER — POTASSIUM CHLORIDE 20 MEQ PO PACK
40.0000 meq | PACK | Freq: Once | ORAL | Status: AC
  Administered 2018-08-26: 40 meq via ORAL
  Filled 2018-08-26: qty 2

## 2018-08-26 MED ORDER — CLONAZEPAM 0.5 MG PO TABS: 0.2500 mg | ORAL_TABLET | Freq: Two times a day (BID) | ORAL | Status: DC | PRN

## 2018-08-26 MED ORDER — CLONAZEPAM 0.25 MG PO TBDP
0.2500 mg | ORAL_TABLET | Freq: Two times a day (BID) | ORAL | Status: DC | PRN
  Administered 2018-08-27: 0.25 mg via ORAL
  Filled 2018-08-26: qty 1

## 2018-08-26 MED ORDER — MORPHINE SULFATE 15 MG PO TABS: 15.0000 mg | ORAL_TABLET | Freq: Two times a day (BID) | ORAL | Status: DC | PRN

## 2018-08-26 MED ORDER — AMITRIPTYLINE HCL 50 MG PO TABS
75.0000 mg | ORAL_TABLET | Freq: Every day | ORAL | Status: DC
  Administered 2018-08-26 – 2018-08-30 (×5): 75 mg via ORAL
  Filled 2018-08-26 (×5): qty 2
  Filled 2018-08-26: qty 1

## 2018-08-26 MED ORDER — POTASSIUM CHLORIDE 20 MEQ/15ML (10%) PO SOLN: 40.0000 meq | Freq: Once | ORAL | Status: DC

## 2018-08-26 MED ORDER — DEXTROSE 50 % IV SOLN
INTRAVENOUS | Status: AC
  Administered 2018-08-26: 50 mL
  Filled 2018-08-26: qty 50

## 2018-08-26 MED ORDER — VITAMIN B-12 1000 MCG SL SUBL: 10000.0000 ug | SUBLINGUAL_TABLET | Freq: Every day | SUBLINGUAL | Status: DC

## 2018-08-26 NOTE — Progress Notes (Signed)
CBG = 57 this am.  D50% given.  Extremities cool to touch.  Tachypneic with RR of 38-48.   Dr Vassie Loll notified as he was making rounds.  [facility] notified as well.  MD stated that patient is experiencing opioid withdrawal and will require close monitoring to necessitate pain management (via pain management scale for dementia patient).  Repeat CBG post treatment of hypoglycemia = WNL.

## 2018-08-26 NOTE — Progress Notes (Signed)
PROGRESS NOTE    [name]  YNW:[id number] DOB: 1946-06-03 DOA: 08/21/2018 PCP: Donato Schultz, DO    Brief Narrative:  72 year old with past medical history relevant for chronic back pain on multiple chronic opiates as well as spinal stimulator, depression/anxiety, migraines, hypertension, ADHD admitted with altered mental status, acute hypoxic respiratory failure and sepsis physiology found to have left obstructing UPJ stone and E. coli sepsis status post PCN tube on 08/22/2018.  Her course has been complicated by hypoglycemia of unclear etiology as well as pancytopenia with a predominantly thrombocytopenic presentation.   Assessment & Plan:   Principal Problem:   Sepsis (HCC) Active Problems:   ANEMIA, MILD   Mild diastolic dysfunction   Essential hypertension   CAP (community acquired pneumonia)   Acute encephalopathy   Obstruction of left ureteropelvic junction (UPJ) due to stone   Hypoalbuminemia   Chronic pain disorder   Thrombocytopenia (HCC)   Acute renal failure (ARF) (HCC)   Compression fracture of spine (HCC)   Pressure injury of skin   Protein-calorie malnutrition, severe  #) Acute metabolic encephalopathy: This appears to be improving and is likely multifactorial.  The most significant and proximal insult is likely her E. coli septicemia.  Additional contributing factors include likely some degree of ICU delirium, as well as withdrawal from her multiple and significant opiates.  Clinically she is improving. -Uptitrate pain medications per below  #) E. coli sepsis from obstructing UPJ stone status post PCN tube on 08/22/2018: -Blood culture from 08/21/2018 growing E. coli susceptible to ceftriaxone -Urine culture from 08/21/2018 and 08/22/2018 growing same E. coli -Continue IV ceftriaxone 2g started 08/23/2018 for total of 2 weeks   #) Hypoglycemia: Patient has had intermittent episodes of hypoglycemia and continues on a D10 drip.  The etiology of this  is not clear.  She does not have a history of diabetes and was not on any insulin during this time.  She is relatively far out from her sepsis to be this because and she has no evidence of significant liver dysfunction.  Suspect some of it might be because she is relatively nutritionally depleted and has not been eating in the setting of being so ill. -Continue D10 drip -We will evaluate for cortisol deficiency if patient continues to have episodes of hypoglycemia  #) Thrombocytopenia: Patient was admitted with significant low platelets thought to be secondary to sepsis.  This appears to be improving though quite delayed. -We will order DIC panel - Avoid anticoagulation  #) AKI: Likely in the setting of sepsis.  Improving. -Avoid nephrotoxins  #) Chronic back pain status post spinal simulator/psych: - Continue to hold MSIR 15 mg twice daily -Continue PRN oral oxycodone - Restart home amitriptyline 75 mg nightly - Restart home clonazepam 0.5 mg twice daily as needed - Restart home venlafaxine 225 mg daily  #) Hypertension: -Hold amlodipine 5 mg daily  #) Migraines: -Hold symmetric pain PRN  Fluids: Tolerating p.o. Electrolytes: Monitor and supplement Nutrition: Regular, dysphagia 2 diet  Prophylaxis: Thrombus cytopenic  Disposition: Per physical therapy SNF needed, resolution of lab abnormalities  Full code  Consultants:   PCCM  Interventional radiology  Urology  Procedures:   08/22/2018 placement of left PCN tube by interventional radiology  Antimicrobials:  IV ceftriaxone started 08/23/2018 to ongoing   Subjective: Patient reports she is doing fairly well though she feels "worn out".  She denies any nausea, vomiting, diarrhea, cough, she appears to be alert and oriented but still quite slow.  Objective: Vitals:   08/26/18 0730 08/26/18 0749 08/26/18 0750 08/26/18 0800  BP:    128/71  Pulse: 96  (!) 103 91  Resp: (!) 28  (!) 26 (!) 44  Temp:  97.6 F (36.4  C)    TempSrc:  Oral    SpO2: 97%  95% 96%  Weight:      Height:        Intake/Output Summary (Last 24 hours) at 08/26/2018 1001 Last data filed at 08/26/2018 0954 Gross per 24 hour  Intake 2651.69 ml  Output 1835 ml  Net 816.69 ml   Filed Weights   08/25/18 0600 08/25/18 2200 08/26/18 0414  Weight: 71.4 kg 67.9 kg 67.9 kg    Examination:  General exam: Appears calm and comfortable  Respiratory system: Mildly tachypneic but comfortable, anterior lung sounds clear, scattered rhonchi Cardiovascular system: Regular rate and rhythm, no murmurs. Gastrointestinal system: Abdomen is nondistended, soft and nontender. No organomegaly or masses felt. Normal bowel sounds heard. Central nervous system: Alert and oriented x3, grossly intact, moving all extremities Extremities: 2+ lower extremity edema Skin: PCN tube site is clean dry and intact, noted to have some bloody urine Psychiatry: Judgement and insight appear normal. Mood & affect appropriate.     Data Reviewed: I have personally reviewed following labs and imaging studies  CBC: Recent Labs  Lab 08/21/18 1057  08/23/18 0034 08/23/18 0816 08/24/18 0252 08/25/18 0451 08/26/18 0546 08/26/18 0839  WBC 11.3*   < > 19.8* 15.4* 22.8* 16.3* 15.4*  --   NEUTROABS 10.0*  --   --  14.1*  --   --   --   --   HGB 10.1*   < > 10.3* 12.0 11.7* 10.3* 11.7*  --   HCT 32.0*   < > 32.6* 39.2 34.0* 30.2* 35.7*  --   MCV 94.1   < > 93.9 99.0 87.9 86.5 87.7  --   PLT 50*   < > 24* 27* 16* 16* 22* 26*   < > = values in this interval not displayed.   Basic Metabolic Panel: Recent Labs  Lab 08/21/18 1815  08/22/18 0016  08/23/18 0034 08/24/18 0252 08/25/18 0451 08/25/18 1922 08/26/18 0314  NA  --    < > 138   < > 140 139 135 134* 134*  K  --    < > 3.1*   < > 3.7 3.4* 2.0* 4.7 3.7  CL  --    < > 106   < > 111 113* 107 104 104  CO2  --    < > 21*   < > 18* 17* 19* 16* 17*  GLUCOSE  --    < > 108*   < > 114* 108* 112* 87 97  BUN   --    < > 51*   < > 51* 48* 51* 48* 45*  CREATININE  --    < > 2.46*   < > 2.27* 1.98* 1.84* 1.66* 1.50*  CALCIUM  --    < > 7.5*   < > 8.0* 8.2* 8.0* 7.9* 7.8*  MG 1.6*  --   --   --  2.0 1.9 1.5*  --  2.1  PHOS  --   --  3.8  --  2.8 2.6 3.8  --  3.2   < > = values in this interval not displayed.   GFR: Estimated Creatinine Clearance: 31.4 mL/min (A) (by C-G formula based on SCr of 1.5 mg/dL (H)).  Liver Function Tests: Recent Labs  Lab 08/21/18 1057 08/22/18 0016  AST 51*  --   ALT 16  --   ALKPHOS 58  --   BILITOT 0.8  --   PROT 4.1*  --   ALBUMIN 2.0* 2.2*   No results for input(s): LIPASE, AMYLASE in the last 168 hours. No results for input(s): AMMONIA in the last 168 hours. Coagulation Profile: Recent Labs  Lab 08/21/18 1815 08/22/18 0016 08/26/18 0839  INR 1.87 1.80 PENDING   Cardiac Enzymes: Recent Labs  Lab 08/21/18 1815 08/22/18 0016 08/22/18 0612  TROPONINI 0.05* 0.05* 0.04*   BNP (last 3 results) No results for input(s): PROBNP in the last 8760 hours. HbA1C: No results for input(s): HGBA1C in the last 72 hours. CBG: Recent Labs  Lab 08/25/18 1952 08/25/18 2020 08/25/18 2049 08/25/18 2330 08/26/18 0332  GLUCAP 68* 64* 81 75 80   Lipid Profile: No results for input(s): CHOL, HDL, LDLCALC, TRIG, CHOLHDL, LDLDIRECT in the last 72 hours. Thyroid Function Tests: No results for input(s): TSH, T4TOTAL, FREET4, T3FREE, THYROIDAB in the last 72 hours. Anemia Panel: No results for input(s): VITAMINB12, FOLATE, FERRITIN, TIBC, IRON, RETICCTPCT in the last 72 hours. Sepsis Labs: Recent Labs  Lab 08/21/18 1139 08/21/18 1254 08/21/18 1815 08/21/18 2116 08/22/18 0612 08/23/18 0034  PROCALCITON  --   --  53.79  --  46.38 31.33  LATICACIDVEN 3.09* 2.35* 3.5* 2.0*  --   --     Recent Results (from the past 240 hour(s))  Blood culture (routine x 2)     Status: Abnormal   Collection Time: 08/21/18 11:02 AM  Result Value Ref Range Status   Specimen  Description BLOOD RIGHT HAND  Final   Special Requests   Final    BOTTLES DRAWN AEROBIC AND ANAEROBIC Blood Culture results may not be optimal due to an inadequate volume of blood received in culture bottles   Culture  Setup Time   Final    GRAM NEGATIVE RODS IN BOTH AEROBIC AND ANAEROBIC BOTTLES Organism ID to follow CRITICAL RESULT CALLED TO, READ BACK BY AND VERIFIED WITHMelven Sartorius [facility] 2952 08/22/18 A BROWNING Performed at [facility], [address] [place], [place] [address]    Culture ESCHERICHIA COLI (A)  Final   Report Status 08/24/2018 FINAL  Final   Organism ID, Bacteria ESCHERICHIA COLI  Final      Susceptibility   Escherichia coli - MIC*    AMPICILLIN >=32 RESISTANT Resistant     CEFAZOLIN <=4 SENSITIVE Sensitive     CEFEPIME <=1 SENSITIVE Sensitive     CEFTAZIDIME <=1 SENSITIVE Sensitive     CEFTRIAXONE <=1 SENSITIVE Sensitive     CIPROFLOXACIN 0.5 SENSITIVE Sensitive     GENTAMICIN <=1 SENSITIVE Sensitive     IMIPENEM <=0.25 SENSITIVE Sensitive     TRIMETH/SULFA >=320 RESISTANT Resistant     AMPICILLIN/SULBACTAM >=32 RESISTANT Resistant     PIP/TAZO <=4 SENSITIVE Sensitive     Extended ESBL NEGATIVE Sensitive     * ESCHERICHIA COLI  Blood Culture ID Panel (Reflexed)     Status: Abnormal   Collection Time: 08/21/18 11:02 AM  Result Value Ref Range Status   Enterococcus species NOT DETECTED NOT DETECTED Final   Vancomycin resistance NOT DETECTED NOT DETECTED Final   Listeria monocytogenes NOT DETECTED NOT DETECTED Final   Staphylococcus species NOT DETECTED NOT DETECTED Final   Staphylococcus aureus (BCID) NOT DETECTED NOT DETECTED Final   Methicillin resistance NOT DETECTED  NOT DETECTED Final   Streptococcus species NOT DETECTED NOT DETECTED Final   Streptococcus agalactiae NOT DETECTED NOT DETECTED Final   Streptococcus pneumoniae NOT DETECTED NOT DETECTED Final   Streptococcus pyogenes NOT DETECTED NOT DETECTED Final   Acinetobacter baumannii NOT  DETECTED NOT DETECTED Final   Enterobacteriaceae species DETECTED (A) NOT DETECTED Final    Comment: CRITICAL RESULT CALLED TO, READ BACK BY AND VERIFIED WITHMelven Sartorius PHARMD 1610 08/22/18 A BROWNING Enterobacteriaceae represent a large family of gram-negative bacteria, not a single organism.    Enterobacter cloacae complex NOT DETECTED NOT DETECTED Final   Escherichia coli DETECTED (A) NOT DETECTED Final    Comment: CRITICAL RESULT CALLED TO, READ BACK BY AND VERIFIED WITH: Melven Sartorius [facility] 9604 08/22/18 A BROWNING    Klebsiella oxytoca NOT DETECTED NOT DETECTED Final   Klebsiella pneumoniae NOT DETECTED NOT DETECTED Final   Proteus species NOT DETECTED NOT DETECTED Final   Serratia marcescens NOT DETECTED NOT DETECTED Final   Carbapenem resistance NOT DETECTED NOT DETECTED Final   Haemophilus influenzae NOT DETECTED NOT DETECTED Final   Neisseria meningitidis NOT DETECTED NOT DETECTED Final   Pseudomonas aeruginosa NOT DETECTED NOT DETECTED Final   Candida albicans NOT DETECTED NOT DETECTED Final   Candida glabrata NOT DETECTED NOT DETECTED Final   Candida krusei NOT DETECTED NOT DETECTED Final   Candida parapsilosis NOT DETECTED NOT DETECTED Final   Candida tropicalis NOT DETECTED NOT DETECTED Final    Comment: Performed at [facility], [address] [place], [place] [address]  Blood culture (routine x 2)     Status: Abnormal   Collection Time: 08/21/18 11:30 AM  Result Value Ref Range Status   Specimen Description BLOOD LEFT HAND  Final   Special Requests   Final    BOTTLES DRAWN AEROBIC AND ANAEROBIC Blood Culture results may not be optimal due to an inadequate volume of blood received in culture bottles   Culture  Setup Time   Final    GRAM NEGATIVE RODS IN BOTH AEROBIC AND ANAEROBIC BOTTLES CRITICAL RESULT CALLED TO, READ BACK BY AND VERIFIED WITHShela Commons [facility] PHARMD 1191 08/22/18 A BROWNING    Culture (A)  Final    ESCHERICHIA COLI SUSCEPTIBILITIES PERFORMED ON  PREVIOUS CULTURE WITHIN THE LAST 5 DAYS. Performed at [facility], [address], [place], [place] [address]    Report Status 08/24/2018 FINAL  Final  Urine culture     Status: Abnormal   Collection Time: 08/21/18 12:33 PM  Result Value Ref Range Status   Specimen Description URINE, CATHETERIZED  Final   Special Requests   Final    NONE Performed at [facility], [address] [place], [place] [address]    Culture >=100,000 COLONIES/mL ESCHERICHIA COLI (A)  Final   Report Status 08/23/2018 FINAL  Final   Organism ID, Bacteria ESCHERICHIA COLI (A)  Final      Susceptibility   Escherichia coli - MIC*    AMPICILLIN >=32 RESISTANT Resistant     CEFAZOLIN <=4 SENSITIVE Sensitive     CEFTRIAXONE <=1 SENSITIVE Sensitive     CIPROFLOXACIN 0.5 SENSITIVE Sensitive     GENTAMICIN <=1 SENSITIVE Sensitive     IMIPENEM <=0.25 SENSITIVE Sensitive     NITROFURANTOIN <=16 SENSITIVE Sensitive     TRIMETH/SULFA >=320 RESISTANT Resistant     AMPICILLIN/SULBACTAM >=32 RESISTANT Resistant     PIP/TAZO <=4 SENSITIVE Sensitive     Extended ESBL NEGATIVE Sensitive     * >=  100,000 COLONIES/mL ESCHERICHIA COLI  MRSA PCR Screening     Status: Abnormal   Collection Time: 08/21/18  5:53 PM  Result Value Ref Range Status   MRSA by PCR POSITIVE (A) NEGATIVE Final    Comment:        The GeneXpert MRSA Assay (FDA approved for NASAL specimens only), is one component of a comprehensive MRSA colonization surveillance program. It is not intended to diagnose MRSA infection nor to guide or monitor treatment for MRSA infections. RESULT CALLED TO, READ BACK BY AND VERIFIED WITH: Tommie Sams RN 08/21/18 1936 JDW Performed at [facility], [address] [place], [place] [address]   Urine Culture     Status: Abnormal   Collection Time: 08/22/18  9:22 AM  Result Value Ref Range Status   Specimen Description KIDNEY LEFT  Final   Special Requests   Final    NONE Performed at [facility], [address] [place], [place] [address]    Culture >=100,000 COLONIES/mL ESCHERICHIA COLI (A)  Final   Report Status 08/24/2018 FINAL  Final   Organism ID, Bacteria ESCHERICHIA COLI (A)  Final      Susceptibility   Escherichia coli - MIC*    AMPICILLIN >=32 RESISTANT Resistant     CEFAZOLIN 8 SENSITIVE Sensitive     CEFEPIME <=1 SENSITIVE Sensitive     CEFTAZIDIME <=1 SENSITIVE Sensitive     CEFTRIAXONE <=1 SENSITIVE Sensitive     CIPROFLOXACIN 0.5 SENSITIVE Sensitive     GENTAMICIN <=1 SENSITIVE Sensitive     IMIPENEM <=0.25 SENSITIVE Sensitive     TRIMETH/SULFA >=320 RESISTANT Resistant     AMPICILLIN/SULBACTAM >=32 RESISTANT Resistant     PIP/TAZO 16 SENSITIVE Sensitive     Extended ESBL NEGATIVE Sensitive     * >=100,000 COLONIES/mL ESCHERICHIA COLI         Radiology Studies: No results found.      Scheduled Meds: . chlorhexidine  15 mL Mouth Rinse BID  . furosemide  40 mg Intravenous Once  . Influenza vac split quadrivalent PF  0.5 mL Intramuscular Tomorrow-1000  . mouth rinse  15 mL Mouth Rinse q12n4p  . potassium chloride  40 mEq Oral Once  . sodium chloride flush  5 mL Intracatheter Q12H   Continuous Infusions: . sodium chloride Stopped (08/26/18 0753)  . cefTRIAXone (ROCEPHIN)  IV 200 mL/hr at 08/25/18 1800  . dextrose 50 mL/hr (08/26/18 0900)  . famotidine (PEPCID) IV 20 mg (08/25/18 1945)     LOS: 5 days    Time spent: 35    Delaine Lame, MD [facility]  If 7PM-7AM, please contact night-coverage [email or web address] Password [email or web address] 08/26/2018, 10:01 AM

## 2018-08-26 NOTE — Progress Notes (Signed)
72 year old admitted 10/14 Found down w/ obstructive uropathy, left hydro, PNA and Septic shockw/ resultant encephalopathy and AKI Recent MRSA skin infection treated by PCP  She is more lucid, less fidgety, more awake and alert and able to hold a conversation better, continues to have 1+ anasarca, decreased breath sounds bilateral soft and nontender abdomen.  Labs show pancytopenia slowly recovering, potassium 3.7 creatinine decreased to 1.5. Impression/plan  Acute metabolic encephalopathy-initially related to sepsis and opiate overdose in the setting of renal insufficiency but now related to opiate withdrawal. Can resume low-dose oxycodone since she is able to take orally and titrate upwards.  AKI -now that potassium repleted, and diuresis again with Lasix 40 and give more potassium. Obstructive uropathy-status post nephrostomy will need urology follow-up  Septic shock due to E. coli bacteremia/aspiration pneumonia-ceftriaxone and to 2 weeks.  Tried to take over from here, PCCM available as needed, can transfer to telemetry  Joss Friedel V. Vassie Loll MD [phone]

## 2018-08-26 NOTE — Progress Notes (Addendum)
P.O.  Intake poor despite encouragement from staff.  Patient not interested in eating, or taking supplemental Ensure.  It was explained to her that her blood sugar level has been too low, and that she needs to partake in decent nutritional intake in order to heal.  She states she is "not a big eater" and is not interested in eating.  Favorite foods appear to be applesauce and ice cream.

## 2018-08-27 LAB — COMPREHENSIVE METABOLIC PANEL
ALT: 11 U/L (ref 0–44)
AST: 29 U/L (ref 15–41)
Albumin: 1.5 g/dL — ABNORMAL LOW (ref 3.5–5.0)
BUN: 41 mg/dL — ABNORMAL HIGH (ref 8–23)
CO2: 23 mmol/L (ref 22–32)
Chloride: 101 mmol/L (ref 98–111)
Creatinine, Ser: 1.38 mg/dL — ABNORMAL HIGH (ref 0.44–1.00)
GFR calc Af Amer: 44 mL/min — ABNORMAL LOW (ref >60)
Glucose, Bld: 101 mg/dL — ABNORMAL HIGH (ref 70–99)
Sodium: 133 mmol/L — ABNORMAL LOW (ref 135–145)

## 2018-08-27 LAB — CBC WITH DIFFERENTIAL/PLATELET
Abs Immature Granulocytes: 0.2 10*3/uL — ABNORMAL HIGH (ref 0.00–0.07)
Basophils Absolute: 0 10*3/uL (ref 0.0–0.1)
Basophils Relative: 0 %
Eosinophils Absolute: 0.1 K/uL (ref 0.0–0.5)
Eosinophils Relative: 1 %
HCT: 29.8 % — ABNORMAL LOW (ref 36.0–46.0)
Hemoglobin: 10 g/dL — ABNORMAL LOW (ref 12.0–15.0)
Immature Granulocytes: 2 %
Lymphocytes Relative: 9 %
Lymphs Abs: 1.1 K/uL (ref 0.7–4.0)
MCH: 29 pg (ref 26.0–34.0)
MCHC: 33.6 g/dL (ref 30.0–36.0)
MCV: 86.4 fL (ref 80.0–100.0)
Monocytes Absolute: 0.9 10*3/uL (ref 0.1–1.0)
Monocytes Relative: 7 %
Neutro Abs: 10.7 10*3/uL — ABNORMAL HIGH (ref 1.7–7.7)
Neutrophils Relative %: 81 %
Platelets: 36 K/uL — ABNORMAL LOW (ref 150–400)
RBC: 3.45 MIL/uL — ABNORMAL LOW (ref 3.87–5.11)
RDW: 13.6 % (ref 11.5–15.5)
WBC: 13 10*3/uL — ABNORMAL HIGH (ref 4.0–10.5)
nRBC: 0 % (ref 0.0–0.2)

## 2018-08-27 LAB — BASIC METABOLIC PANEL
BUN: 39 mg/dL — ABNORMAL HIGH (ref 8–23)
Creatinine, Ser: 1.26 mg/dL — ABNORMAL HIGH (ref 0.44–1.00)
GFR calc Af Amer: 49 mL/min — ABNORMAL LOW (ref >60)
GFR calc non Af Amer: 42 mL/min — ABNORMAL LOW (ref >60)
Sodium: 133 mmol/L — ABNORMAL LOW (ref 135–145)

## 2018-08-27 LAB — BASIC METABOLIC PANEL WITH GFR
Anion gap: 8 (ref 5–15)
CO2: 22 mmol/L (ref 22–32)
Calcium: 8.1 mg/dL — ABNORMAL LOW (ref 8.9–10.3)
Chloride: 103 mmol/L (ref 98–111)
Glucose, Bld: 92 mg/dL (ref 70–99)
Potassium: 2.9 mmol/L — ABNORMAL LOW (ref 3.5–5.1)

## 2018-08-27 LAB — COMPREHENSIVE METABOLIC PANEL WITH GFR
Alkaline Phosphatase: 83 U/L (ref 38–126)
Anion gap: 9 (ref 5–15)
Calcium: 7.8 mg/dL — ABNORMAL LOW (ref 8.9–10.3)
GFR calc non Af Amer: 38 mL/min — ABNORMAL LOW (ref >60)
Potassium: 2.3 mmol/L — CL (ref 3.5–5.1)
Total Bilirubin: 1 mg/dL (ref 0.3–1.2)
Total Protein: 4.1 g/dL — ABNORMAL LOW (ref 6.5–8.1)

## 2018-08-27 LAB — GLUCOSE, CAPILLARY
GLUCOSE-CAPILLARY: 113 mg/dL — AB (ref 70–99)
GLUCOSE-CAPILLARY: 79 mg/dL (ref 70–99)
Glucose-Capillary: 87 mg/dL (ref 70–99)
Glucose-Capillary: 88 mg/dL (ref 70–99)
Glucose-Capillary: 92 mg/dL (ref 70–99)

## 2018-08-27 LAB — MAGNESIUM: Magnesium: 1.8 mg/dL (ref 1.7–2.4)

## 2018-08-27 MED ORDER — POTASSIUM CHLORIDE 10 MEQ/100ML IV SOLN
10.0000 meq | INTRAVENOUS | Status: AC
  Administered 2018-08-27 (×4): 10 meq via INTRAVENOUS
  Filled 2018-08-27 (×2): qty 100

## 2018-08-27 MED ORDER — POTASSIUM CHLORIDE CRYS ER 20 MEQ PO TBCR
40.0000 meq | EXTENDED_RELEASE_TABLET | Freq: Two times a day (BID) | ORAL | Status: DC
  Administered 2018-08-27 – 2018-08-29 (×5): 40 meq via ORAL
  Filled 2018-08-27 (×5): qty 2

## 2018-08-27 MED ORDER — FAMOTIDINE 20 MG PO TABS
20.0000 mg | ORAL_TABLET | Freq: Every day | ORAL | Status: DC
  Administered 2018-08-27 – 2018-09-01 (×6): 20 mg via ORAL
  Filled 2018-08-27 (×5): qty 1

## 2018-08-27 NOTE — Evaluation (Signed)
Occupational Therapy Evaluation Patient Details Name: [name] MRN: [id number] DOB: 1946/01/07 Today's Date: 08/27/2018    History of Present Illness 72 year old admitted 10/14 Found down w/ obstructive uropathy, left hydro, PNA and severe sepsis w/ resultant encephalopathy and AKI. PMH includes but not limited to: Chronic back pain, congestive heart disease,  HTN, HLD, Anxiety, ADD, spinal cord stimulator implant, back surgery.    Clinical Impression   Patient presents supine in bed.  RN cleared for participation in OT. Assisted to EOB with maximal assistance, returned to supine with total assistance, required +2 total assist to roll in bed due to fatigue.  Presents with significantly decreased activity tolerance and generalized weakness, able to maintain sitting EOB with min guard to close supervision for approximately 6 minutes. Confused, oriented to person only; able to follow 1 step commands inconsistently approximately 75% of the time.  Reports lives with husband who works (travels a lot) and was independent with mobility and ADLs, but unsure of reliability of history.  Patient will benefit from continued OT services while admitted and after dc at SNF in order to optimize independence with ADls and mobility.  Will continue to follow.     Follow Up Recommendations  SNF;Supervision/Assistance - 24 hour    Equipment Recommendations  Other (comment)(TBD at next venue of care)    Recommendations for Other Services       Precautions / Restrictions Precautions Precautions: Fall Restrictions Weight Bearing Restrictions: No      Mobility Bed Mobility Overal bed mobility: Needs Assistance Bed Mobility: Supine to Sit;Sit to Supine;Rolling Rolling: Total assist;+2 for physical assistance   Supine to sit: Max assist Sit to supine: Total assist   General bed mobility comments: max assist to transition to sitting EOB, total assist to return to supine and total assist +2 to  roll/scoot up in bed  Transfers                 General transfer comment: deferred due to safety at this time     Balance Overall balance assessment: Needs assistance Sitting-balance support: No upper extremity supported;Feet unsupported Sitting balance-Leahy Scale: Fair Sitting balance - Comments: sitting EOB with min guard to close supervision                                   ADL either performed or assessed with clinical judgement   ADL Overall ADL's : Needs assistance/impaired     Grooming: Set up;Wash/dry hands;Wash/dry face;Sitting   Upper Body Bathing: Maximal assistance;Sitting   Lower Body Bathing: Total assistance;+2 for physical assistance;Bed level   Upper Body Dressing : Total assistance;Bed level Upper Body Dressing Details (indicate cue type and reason): to change gown Lower Body Dressing: Total assistance;+2 for physical assistance;Bed level     Toilet Transfer Details (indicate cue type and reason): deferred due to safety Toileting- Clothing Manipulation and Hygiene: Total assistance;+2 for physical assistance;Bed level       Functional mobility during ADLs: Total assistance;Maximal assistance;+2 for physical assistance(bed mobility) General ADL Comments: pt requires increased time for processing and initation of all activities, limited by generalized weakness and cogntion     Vision   Additional Comments: difficult to assess, further assessment required     Perception     Praxis      Pertinent Vitals/Pain Pain Assessment: Faces Faces Pain Scale: Hurts even more Pain Location: back Pain Descriptors / Indicators: Discomfort  Pain Intervention(s): Monitored during session;Repositioned;Limited activity within patient's tolerance     Hand Dominance Right   Extremity/Trunk Assessment Upper Extremity Assessment Upper Extremity Assessment: Generalized weakness   Lower Extremity Assessment Lower Extremity Assessment: Defer to  PT evaluation   Cervical / Trunk Assessment Cervical / Trunk Assessment: Kyphotic   Communication Communication Communication: No difficulties   Cognition Arousal/Alertness: Lethargic Behavior During Therapy: Restless;Flat affect Overall Cognitive Status: No family/caregiver present to determine baseline cognitive functioning Area of Impairment: Orientation;Attention;Memory;Following commands;Safety/judgement;Awareness;Problem solving                 Orientation Level: Disoriented to;Place;Time;Situation Current Attention Level: Focused Memory: Decreased short-term memory;Decreased recall of precautions Following Commands: Follows one step commands inconsistently;Follows one step commands with increased time Safety/Judgement: Decreased awareness of deficits;Decreased awareness of safety Awareness: Intellectual Problem Solving: Slow processing;Decreased initiation;Difficulty sequencing;Requires verbal cues;Requires tactile cues     General Comments  VSS    Exercises     Shoulder Instructions      Home Living Family/patient expects to be discharged to:: Skilled nursing facility                                        Prior Functioning/Environment          Comments: pt reports indepedent with mobility and ADLs, but confused and poor historian with skin breakdown appears sedentary         OT Problem List: Decreased strength;Decreased range of motion;Decreased activity tolerance;Impaired balance (sitting and/or standing);Decreased coordination;Decreased cognition;Decreased safety awareness;Decreased knowledge of use of DME or AE;Decreased knowledge of precautions;Pain      OT Treatment/Interventions: Self-care/ADL training;Therapeutic exercise;DME and/or AE instruction;Therapeutic activities;Patient/family education;Balance training;Cognitive remediation/compensation    OT Goals(Current goals can be found in the care plan section) Acute Rehab OT  Goals Patient Stated Goal: none stated OT Goal Formulation: With patient Time For Goal Achievement: 09/10/18 Potential to Achieve Goals: Fair  OT Frequency: Min 2X/week   Barriers to D/C:            Co-evaluation              AM-PAC PT "6 Clicks" Daily Activity     Outcome Measure Help from another person eating meals?: A Lot Help from another person taking care of personal grooming?: A Little Help from another person toileting, which includes using toliet, bedpan, or urinal?: Total Help from another person bathing (including washing, rinsing, drying)?: A Lot Help from another person to put on and taking off regular upper body clothing?: Total Help from another person to put on and taking off regular lower body clothing?: Total 6 Click Score: 10   End of Session Nurse Communication: Mobility status  Activity Tolerance: Patient limited by fatigue;Patient limited by lethargy Patient left: in bed;with call bell/phone within reach;with bed alarm set;Other (comment)(lab in room)  OT Visit Diagnosis: Other abnormalities of gait and mobility (R26.89);Muscle weakness (generalized) (M62.81);Other symptoms and signs involving cognitive function                Time: 7829-5621 OT Time Calculation (min): 32 min Charges:  OT General Charges $OT Visit: 1 Visit OT Evaluation $OT Eval High Complexity: 1 High OT Treatments $Self Care/Home Management : 8-22 mins  Chancy Milroy, OT Acute Rehabilitation Services Pager [phone] Office [phone]   Chancy Milroy 08/27/2018, 2:53 PM

## 2018-08-27 NOTE — Progress Notes (Signed)
CRITICAL VALUE ALERT  Critical Value:  K+: 2.3  Date & Time Notied:  10/20 @0340   Provider Notified: Verline Lema  Orders Received/Actions taken: Awaiting instructions.

## 2018-08-27 NOTE — NC FL2 (Signed)
[place] MEDICAID FL2 LEVEL OF CARE SCREENING TOOL     IDENTIFICATION  Patient Name: [name] Birthdate: 11-Sep-1946 Sex: female Admission Date (Current Location): 08/21/2018  [facility] and [place] Number:  Producer, television/film/video and Address:  [facility] [place]. [facility], [address] [place], [place] [address]      Provider Number: [id number]  Attending Physician Name and Address:  Delaine Lame, MD  Relative Name and Phone Number:  [phone] Cheyne Bungert    Current Level of Care: Hospital Recommended Level of Care: Skilled Nursing Facility Prior Approval Number:    Date Approved/Denied:   PASRR Number:    Discharge Plan: SNF    Current Diagnoses: Patient Active Problem List   Diagnosis Date Noted  . Protein-calorie malnutrition, severe 08/24/2018  . Pressure injury of skin 08/22/2018  . Obstruction of left ureteropelvic junction (UPJ) due to stone 08/21/2018  . Hypoalbuminemia 08/21/2018  . Chronic pain disorder 08/21/2018  . Thrombocytopenia (HCC) 08/21/2018  . Acute renal failure (ARF) (HCC) 08/21/2018  . Compression fracture of spine (HCC) 08/21/2018  . Acute respiratory failure with hypoxemia (HCC)   . Hematoma 06/03/2018  . Dysphagia 02/07/2018  . Generalized anxiety disorder 07/04/2017  . Nonintractable headache 07/04/2017  . Exertional dyspnea 08/05/2016  . Sepsis (HCC) 03/07/2015  . Hypokalemia 03/07/2015  . Acute encephalopathy 03/06/2015  . Fever 03/06/2015  . Chronic back pain 01/15/2015  . Obesity (BMI 30-39.9) 10/15/2013  . Pulmonary nodules 10/15/2013  . CAP (community acquired pneumonia) 10/02/2013  . Essential hypertension 08/24/2013  . Idiopathic scoliosis 04/19/2013  . Back pain 04/19/2013  . Osteoporosis 04/19/2013  . Edema 04/19/2013  . Mild diastolic dysfunction 01/01/2013  . Leg pain, bilateral 12/16/2012  . ADD (attention deficit disorder) 05/24/2012  . Involuntary movements 05/24/2012  . DIZZINESS 12/31/2009   . TREMOR, ESSENTIAL 12/12/2009  . PALPITATIONS, RECURRENT 12/12/2009  . OTHER VITAMIN B12 DEFICIENCY ANEMIA 10/30/2009  . FEMALE STRESS INCONTINENCE 10/28/2009  . MEMORY LOSS 10/28/2009  . Morbid obesity (HCC) 12/03/2008  . GERD 12/03/2008  . ANEMIA, MILD 06/26/2008  . DEPRESSION/ANXIETY 12/28/2007  . PICA 12/28/2007  . ACUTE BRONCHITIS 12/28/2007  . FATIGUE 12/28/2007  . Hyperlipidemia 05/15/2007  . ARTIFICIAL MENOPAUSE 05/15/2007  . Myalgia and myositis, unspecified 05/15/2007  . HEADACHE 05/15/2007  . CHEST PAIN, ATYPICAL 05/15/2007  . SYMPTOM, PAIN, ABDOMINAL, EPIGASTRIC 05/15/2007  . REDUCTION MAMMOPLASTY, HX OF 05/15/2007  . PSTPRC STATUS, BARIATRIC SURGERY 05/15/2007    Orientation RESPIRATION BLADDER Height & Weight     Self  Normal Continent Weight: 145 lb 15.1 oz (66.2 kg) Height:  5\' 5"  (165.1 cm)  BEHAVIORAL SYMPTOMS/MOOD NEUROLOGICAL BOWEL NUTRITION STATUS      Continent (DYS 2 thin fluid)  AMBULATORY STATUS COMMUNICATION OF NEEDS Skin   Supervision Verbally (Deep Tissue inury Sacram Foam dressing)                       Personal Care Assistance Level of Assistance  Bathing, Feeding Bathing Assistance: Limited assistance Feeding assistance: Limited assistance       Functional Limitations Info  Sight, Hearing, Speech Sight Info: Adequate Hearing Info: Adequate Speech Info: Adequate    SPECIAL CARE FACTORS FREQUENCY  PT (By licensed PT), OT (By licensed OT)     PT Frequency: 2x OT Frequency: 2x            Contractures      Additional Factors Info  Code Status Code Status Info: Full  Code             Current Medications (08/27/2018):  This is the current hospital active medication list Current Facility-Administered Medications  Medication Dose Route Frequency Provider Last Rate Last Dose  . 0.9 %  sodium chloride infusion   Intravenous PRN Alyson Reedy, MD   Stopped at 08/26/18 [phone]  . amitriptyline (ELAVIL) tablet 75 mg  75 mg  Oral QHS Purohit, Shrey C, MD   75 mg at 08/26/18 2030  . amphetamine-dextroamphetamine (ADDERALL XR) 24 hr capsule 20 mg  20 mg Oral Daily Purohit, Shrey C, MD   20 mg at 08/27/18 0945  . cefTRIAXone (ROCEPHIN) 2 g in sodium chloride 0.9 % 100 mL IVPB  2 g Intravenous Q24H Cyril Mourning V, MD 200 mL/hr at 08/26/18 1726 2 g at 08/26/18 1726  . chlorhexidine (PERIDEX) 0.12 % solution 15 mL  15 mL Mouth Rinse BID Oretha Milch, MD   15 mL at 08/27/18 0946  . clonazePAM (KLONOPIN) disintegrating tablet 0.25 mg  0.25 mg Oral BID PRN Purohit, Shrey C, MD   0.25 mg at 08/27/18 0945  . dextrose 10 % infusion   Intravenous Continuous Oretha Milch, MD 50 mL/hr at 08/27/18 0622    . famotidine (PEPCID) IVPB 20 mg premix  20 mg Intravenous Q24H Selmer Dominion B, NP   Stopped at 08/26/18 2145  . fentaNYL (SUBLIMAZE) injection 25-50 mcg  25-50 mcg Intravenous Q2H PRN Oretha Milch, MD   50 mcg at 08/25/18 2153  . MEDLINE mouth rinse  15 mL Mouth Rinse q12n4p Oretha Milch, MD   15 mL at 08/26/18 1200  . metoprolol tartrate (LOPRESSOR) injection 2.5-5 mg  2.5-5 mg Intravenous Q3H PRN Oretha Milch, MD   5 mg at 08/24/18 1033  . oxyCODONE (Oxy IR/ROXICODONE) immediate release tablet 5 mg  5 mg Oral Q6H PRN Oretha Milch, MD   5 mg at 08/27/18 0946  . potassium chloride SA (K-DUR,KLOR-CON) CR tablet 40 mEq  40 mEq Oral BID Elson Areas, PA-C   40 mEq at 08/27/18 0946  . sodium chloride flush (NS) 0.9 % injection 5 mL  5 mL Intracatheter Q12H Irish Lack, MD   5 mL at 08/27/18 0946  . venlafaxine XR (EFFEXOR-XR) 24 hr capsule 225 mg  225 mg Oral Daily Purohit, Shrey C, MD   225 mg at 08/27/18 0945     Discharge Medications: Please see discharge summary for a list of discharge medications.  Relevant Imaging Results:  Relevant Lab Results:   Additional Information SN: [id number]  Mamie Nick, LCSW

## 2018-08-27 NOTE — Plan of Care (Signed)
Discussed plan of care for the evening with patient.  Emphasized the importance of using the call button when assistance is needed.  Also stressed the importance of eating well-balanced meals to maintain an appropriate blood glucose level.  No teach back displayed.

## 2018-08-27 NOTE — Progress Notes (Signed)
PROGRESS NOTE    [name]  JYN:[id number] DOB: [birth year] DOA: 08/21/2018 PCP: Donato Schultz, DO    Brief Narrative:  72 year old with past medical history relevant for chronic back pain on multiple chronic opiates as well as spinal stimulator, depression/anxiety, migraines, hypertension, ADHD admitted with altered mental status, acute hypoxic respiratory failure and sepsis physiology found to have left obstructing UPJ stone and E. coli sepsis status post PCN tube on 08/22/2018.  Her course has been complicated by hypoglycemia of unclear etiology as well as thrombocytopenia   Assessment & Plan:   Principal Problem:   Sepsis (HCC) Active Problems:   ANEMIA, MILD   Mild diastolic dysfunction   Essential hypertension   CAP (community acquired pneumonia)   Acute encephalopathy   Obstruction of left ureteropelvic junction (UPJ) due to stone   Hypoalbuminemia   Chronic pain disorder   Thrombocytopenia (HCC)   Acute renal failure (ARF) (HCC)   Compression fracture of spine (HCC)   Pressure injury of skin   Protein-calorie malnutrition, severe  #) Acute metabolic encephalopathy: Multifactorial including likely some withdrawal from pain medication as well as sepsis secondary to E. coli from obstructed stone.  This appears to gradually improving. -Uptitrate pain medications per below  #) E. coli sepsis from obstructing UPJ stone status post PCN tube on 08/22/2018: -Blood culture from 08/21/2018 growing E. coli susceptible to ceftriaxone -Urine culture from 08/21/2018 and 08/22/2018 growing same E. coli -Continue IV ceftriaxone 2g started 08/23/2018 for total of 2 weeks   #) Hypokalemia: Likely in the setting of significant fluid shifts.  As well as improving GFR from renal dysfunction. -Replete aggressively -Recheck this afternoon -Telemetry  #) Hypoglycemia: This appears to be now resolving as the patient is taking more p.o. -Discontinue D10 drip -Continue monitor  blood sugar  #) Thrombocytopenia: Secondary to sepsis, improving -DIC panel shows elevated PTT likely resolving DIC, fibrinogen is elevated likely as an acute phase reactant - Avoid anticoagulation  #) AKI: Likely in the setting of sepsis.  Improving. -Avoid nephrotoxins  #) Chronic back pain status post spinal simulator/psych: - Hold MSIR 15 mg twice daily -Continue PRN oral oxycodone - Continue home amitriptyline 75 mg nightly - Continue home clonazepam 0.5 mg twice daily as needed - Continue home venlafaxine 225 mg daily  #) Hypertension: -Hold amlodipine 5 mg daily  #) Migraines: -Hold triptan PRN  Fluids: Tolerating p.o. Electrolytes: Monitor and supplement Nutrition: Regular, dysphagia 2 diet  Prophylaxis: Thrombocytopenic  Disposition: Per physical therapy SNF needed, resolution of lab abnormalities  Full code  Consultants:   PCCM  Interventional radiology  Urology  Procedures:   08/22/2018 placement of left PCN tube by interventional radiology  Antimicrobials:  IV ceftriaxone started 08/23/2018 to ongoing   Subjective: Patient reports she is doing better.  She denies any nausea, vomiting, diarrhea, cough, congestion.  She reports that she is ready to get better and hopefully get out of here.  Objective: Vitals:   08/26/18 1937 08/26/18 2300 08/27/18 0500 08/27/18 0816  BP: 118/60 (!) 90/45 (!) 112/51 118/87  Pulse: 92 90 94 98  Resp: (!) 25 (!) 26 (!) 26 (!) 27  Temp: 97.6 F (36.4 C) (!) 97.3 F (36.3 C) 98.7 F (37.1 C) 98.5 F (36.9 C)  TempSrc: Oral Oral Oral Oral  SpO2: 99% 100% 100% 96%  Weight:   66.2 kg   Height:        Intake/Output Summary (Last 24 hours) at 08/27/2018 1109 Last data filed  at 08/27/2018 0945 Gross per 24 hour  Intake 920 ml  Output 3535 ml  Net -2615 ml   Filed Weights   08/25/18 2200 08/26/18 0414 08/27/18 0500  Weight: 67.9 kg 67.9 kg 66.2 kg    Examination:  General exam: Appears calm and  comfortable  Respiratory system: No increased work of breathing, clear to auscultation bilaterally, diminished lung sounds at bases Cardiovascular system: Regular rate and rhythm, no murmurs. Gastrointestinal system: Abdomen is nondistended, soft and nontender. No organomegaly or masses felt. Normal bowel sounds heard. Central nervous system: Alert and oriented x3, grossly intact, moving all extremities Extremities: 1+ lower extremity edema Skin: PCN tube site is clean dry and intact, noted to have some bloody urine Psychiatry: Judgement and insight appear normal. Mood & affect appropriate.     Data Reviewed: I have personally reviewed following labs and imaging studies  CBC: Recent Labs  Lab 08/21/18 1057  08/23/18 0816 08/24/18 0252 08/25/18 0451 08/26/18 0546 08/26/18 0839 08/27/18 0206  WBC 11.3*   < > 15.4* 22.8* 16.3* 15.4*  --  13.0*  NEUTROABS 10.0*  --  14.1*  --   --   --   --  10.7*  HGB 10.1*   < > 12.0 11.7* 10.3* 11.7*  --  10.0*  HCT 32.0*   < > 39.2 34.0* 30.2* 35.7*  --  29.8*  MCV 94.1   < > 99.0 87.9 86.5 87.7  --  86.4  PLT 50*   < > 27* 16* 16* 22* 26* 36*   < > = values in this interval not displayed.   Basic Metabolic Panel: Recent Labs  Lab 08/22/18 0016  08/23/18 0034 08/24/18 0252 08/25/18 0451 08/25/18 1922 08/26/18 0314 08/27/18 0206  NA 138   < > 140 139 135 134* 134* 133*  K 3.1*   < > 3.7 3.4* 2.0* 4.7 3.7 2.3*  CL 106   < > 111 113* 107 104 104 101  CO2 21*   < > 18* 17* 19* 16* 17* 23  GLUCOSE 108*   < > 114* 108* 112* 87 97 101*  BUN 51*   < > 51* 48* 51* 48* 45* 41*  CREATININE 2.46*   < > 2.27* 1.98* 1.84* 1.66* 1.50* 1.38*  CALCIUM 7.5*   < > 8.0* 8.2* 8.0* 7.9* 7.8* 7.8*  MG  --   --  2.0 1.9 1.5*  --  2.1 1.8  PHOS 3.8  --  2.8 2.6 3.8  --  3.2  --    < > = values in this interval not displayed.   GFR: Estimated Creatinine Clearance: 34.1 mL/min (A) (by C-G formula based on SCr of 1.38 mg/dL (H)). Liver Function  Tests: Recent Labs  Lab 08/21/18 1057 08/22/18 0016 08/27/18 0206  AST 51*  --  29  ALT 16  --  11  ALKPHOS 58  --  83  BILITOT 0.8  --  1.0  PROT 4.1*  --  4.1*  ALBUMIN 2.0* 2.2* 1.5*   No results for input(s): LIPASE, AMYLASE in the last 168 hours. No results for input(s): AMMONIA in the last 168 hours. Coagulation Profile: Recent Labs  Lab 08/21/18 1815 08/22/18 0016 08/26/18 0839  INR 1.87 1.80 2.80   Cardiac Enzymes: Recent Labs  Lab 08/21/18 1815 08/22/18 0016 08/22/18 0612  TROPONINI 0.05* 0.05* 0.04*   BNP (last 3 results) No results for input(s): PROBNP in the last 8760 hours. HbA1C: No results for input(s): HGBA1C  in the last 72 hours. CBG: Recent Labs  Lab 08/26/18 1545 08/26/18 2043 08/27/18 0031 08/27/18 0404 08/27/18 0814  GLUCAP 105* 116* 88 92 87   Lipid Profile: No results for input(s): CHOL, HDL, LDLCALC, TRIG, CHOLHDL, LDLDIRECT in the last 72 hours. Thyroid Function Tests: No results for input(s): TSH, T4TOTAL, FREET4, T3FREE, THYROIDAB in the last 72 hours. Anemia Panel: No results for input(s): VITAMINB12, FOLATE, FERRITIN, TIBC, IRON, RETICCTPCT in the last 72 hours. Sepsis Labs: Recent Labs  Lab 08/21/18 1139 08/21/18 1254 08/21/18 1815 08/21/18 2116 08/22/18 0612 08/23/18 0034  PROCALCITON  --   --  53.79  --  46.38 31.33  LATICACIDVEN 3.09* 2.35* 3.5* 2.0*  --   --     Recent Results (from the past 240 hour(s))  Blood culture (routine x 2)     Status: Abnormal   Collection Time: 08/21/18 11:02 AM  Result Value Ref Range Status   Specimen Description BLOOD RIGHT HAND  Final   Special Requests   Final    BOTTLES DRAWN AEROBIC AND ANAEROBIC Blood Culture results may not be optimal due to an inadequate volume of blood received in culture bottles   Culture  Setup Time   Final    GRAM NEGATIVE RODS IN BOTH AEROBIC AND ANAEROBIC BOTTLES Organism ID to follow CRITICAL RESULT CALLED TO, READ BACK BY AND VERIFIED WITHMelven Sartorius [facility] [id number] 08/22/18 A BROWNING Performed at [facility], [address], [place], [place] [address]    Culture ESCHERICHIA COLI (A)  Final   Report Status 08/24/2018 FINAL  Final   Organism ID, Bacteria ESCHERICHIA COLI  Final      Susceptibility   Escherichia coli - MIC*    AMPICILLIN >=32 RESISTANT Resistant     CEFAZOLIN <=4 SENSITIVE Sensitive     CEFEPIME <=1 SENSITIVE Sensitive     CEFTAZIDIME <=1 SENSITIVE Sensitive     CEFTRIAXONE <=1 SENSITIVE Sensitive     CIPROFLOXACIN 0.5 SENSITIVE Sensitive     GENTAMICIN <=1 SENSITIVE Sensitive     IMIPENEM <=0.25 SENSITIVE Sensitive     TRIMETH/SULFA >=320 RESISTANT Resistant     AMPICILLIN/SULBACTAM >=32 RESISTANT Resistant     PIP/TAZO <=4 SENSITIVE Sensitive     Extended ESBL NEGATIVE Sensitive     * ESCHERICHIA COLI  Blood Culture ID Panel (Reflexed)     Status: Abnormal   Collection Time: 08/21/18 11:02 AM  Result Value Ref Range Status   Enterococcus species NOT DETECTED NOT DETECTED Final   Vancomycin resistance NOT DETECTED NOT DETECTED Final   Listeria monocytogenes NOT DETECTED NOT DETECTED Final   Staphylococcus species NOT DETECTED NOT DETECTED Final   Staphylococcus aureus (BCID) NOT DETECTED NOT DETECTED Final   Methicillin resistance NOT DETECTED NOT DETECTED Final   Streptococcus species NOT DETECTED NOT DETECTED Final   Streptococcus agalactiae NOT DETECTED NOT DETECTED Final   Streptococcus pneumoniae NOT DETECTED NOT DETECTED Final   Streptococcus pyogenes NOT DETECTED NOT DETECTED Final   Acinetobacter baumannii NOT DETECTED NOT DETECTED Final   Enterobacteriaceae species DETECTED (A) NOT DETECTED Final    Comment: CRITICAL RESULT CALLED TO, READ BACK BY AND VERIFIED WITHMelven Sartorius [facility]AR[facility] [id number] 08/22/18 A BROWNING Enterobacteriaceae represent a large family of gram-negative bacteria, not a single organism.    Enterobacter cloacae complex NOT DETECTED NOT DETECTED Final   Escherichia coli  DETECTED (A) NOT DETECTED Final    Comment: CRITICAL RESULT CALLED TO, READ BACK BY AND VERIFIED WITH: J  [facility] 1610 08/22/18 A BROWNING    Klebsiella oxytoca NOT DETECTED NOT DETECTED Final   Klebsiella pneumoniae NOT DETECTED NOT DETECTED Final   Proteus species NOT DETECTED NOT DETECTED Final   Serratia marcescens NOT DETECTED NOT DETECTED Final   Carbapenem resistance NOT DETECTED NOT DETECTED Final   Haemophilus influenzae NOT DETECTED NOT DETECTED Final   Neisseria meningitidis NOT DETECTED NOT DETECTED Final   Pseudomonas aeruginosa NOT DETECTED NOT DETECTED Final   Candida albicans NOT DETECTED NOT DETECTED Final   Candida glabrata NOT DETECTED NOT DETECTED Final   Candida krusei NOT DETECTED NOT DETECTED Final   Candida parapsilosis NOT DETECTED NOT DETECTED Final   Candida tropicalis NOT DETECTED NOT DETECTED Final    Comment: Performed at [facility], [address], [place], [place] [address]  Blood culture (routine x 2)     Status: Abnormal   Collection Time: 08/21/18 11:30 AM  Result Value Ref Range Status   Specimen Description BLOOD LEFT HAND  Final   Special Requests   Final    BOTTLES DRAWN AEROBIC AND ANAEROBIC Blood Culture results may not be optimal due to an inadequate volume of blood received in culture bottles   Culture  Setup Time   Final    GRAM NEGATIVE RODS IN BOTH AEROBIC AND ANAEROBIC BOTTLES CRITICAL RESULT CALLED TO, READ BACK BY AND VERIFIED WITHShela Commons [facility] PHARMD [id number] 08/22/18 A BROWNING    Culture (A)  Final    ESCHERICHIA COLI SUSCEPTIBILITIES PERFORMED ON PREVIOUS CULTURE WITHIN THE LAST 5 DAYS. Performed at [facility], [address], [place], [place] [address]    Report Status 08/24/2018 FINAL  Final  Urine culture     Status: Abnormal   Collection Time: 08/21/18 12:33 PM  Result Value Ref Range Status   Specimen Description URINE, CATHETERIZED  Final   Special Requests   Final    NONE Performed at [facility], [address] [place], [place] [address]    Culture >=100,000 COLONIES/mL ESCHERICHIA COLI (A)  Final   Report Status 08/23/2018 FINAL  Final   Organism ID, Bacteria ESCHERICHIA COLI (A)  Final      Susceptibility   Escherichia coli - MIC*    AMPICILLIN >=32 RESISTANT Resistant     CEFAZOLIN <=4 SENSITIVE Sensitive     CEFTRIAXONE <=1 SENSITIVE Sensitive     CIPROFLOXACIN 0.5 SENSITIVE Sensitive     GENTAMICIN <=1 SENSITIVE Sensitive     IMIPENEM <=0.25 SENSITIVE Sensitive     NITROFURANTOIN <=16 SENSITIVE Sensitive     TRIMETH/SULFA >=320 RESISTANT Resistant     AMPICILLIN/SULBACTAM >=32 RESISTANT Resistant     PIP/TAZO <=4 SENSITIVE Sensitive     Extended ESBL NEGATIVE Sensitive     * >=100,000 COLONIES/mL ESCHERICHIA COLI  MRSA PCR Screening     Status: Abnormal   Collection Time: 08/21/18  5:53 PM  Result Value Ref Range Status   MRSA by PCR POSITIVE (A) NEGATIVE Final    Comment:        The GeneXpert MRSA Assay (FDA approved for NASAL specimens only), is one component of a comprehensive MRSA colonization surveillance program. It is not intended to diagnose MRSA infection nor to guide or monitor treatment for MRSA infections. RESULT CALLED TO, READ BACK BY AND VERIFIED WITH: Tommie Sams RN 08/21/18 1936 JDW Performed at [facility], [address] [place], [place] [address]   Urine Culture     Status:  Abnormal   Collection Time: 08/22/18  9:22 AM  Result Value Ref Range Status   Specimen Description KIDNEY LEFT  Final   Special Requests   Final    NONE Performed at [facility], [address] [place], [place] [address]    Culture >=100,000 COLONIES/mL ESCHERICHIA COLI (A)  Final   Report Status 08/24/2018 FINAL  Final   Organism ID, Bacteria ESCHERICHIA COLI (A)  Final      Susceptibility   Escherichia coli - MIC*    AMPICILLIN >=32 RESISTANT Resistant     CEFAZOLIN 8 SENSITIVE Sensitive     CEFEPIME <=1 SENSITIVE Sensitive      CEFTAZIDIME <=1 SENSITIVE Sensitive     CEFTRIAXONE <=1 SENSITIVE Sensitive     CIPROFLOXACIN 0.5 SENSITIVE Sensitive     GENTAMICIN <=1 SENSITIVE Sensitive     IMIPENEM <=0.25 SENSITIVE Sensitive     TRIMETH/SULFA >=320 RESISTANT Resistant     AMPICILLIN/SULBACTAM >=32 RESISTANT Resistant     PIP/TAZO 16 SENSITIVE Sensitive     Extended ESBL NEGATIVE Sensitive     * >=100,000 COLONIES/mL ESCHERICHIA COLI         Radiology Studies: Dg Chest Port 1 View  Result Date: 08/26/2018 CLINICAL DATA:  Tachypnea this morning. EXAM: PORTABLE CHEST 1 VIEW COMPARISON:  08/24/2018 FINDINGS: Lungs are hypoinflated with persistent patchy bilateral airspace process right worse than left with mild improved aeration over the right mid to upper lung. No definite effusion. Cardiomediastinal silhouette and remainder of the exam is unchanged. IMPRESSION: Mild interval improvement and patchy bilateral multifocal airspace process right worse than left likely improving multifocal pneumonia. Electronically Signed   By: Elberta Fortis M.D.   On: 08/26/2018 11:05        Scheduled Meds: . amitriptyline  75 mg Oral QHS  . amphetamine-dextroamphetamine  20 mg Oral Daily  . chlorhexidine  15 mL Mouth Rinse BID  . famotidine  20 mg Oral Daily  . mouth rinse  15 mL Mouth Rinse q12n4p  . potassium chloride  40 mEq Oral BID  . sodium chloride flush  5 mL Intracatheter Q12H  . venlafaxine XR  225 mg Oral Daily   Continuous Infusions: . sodium chloride Stopped (08/26/18 0753)  . cefTRIAXone (ROCEPHIN)  IV 2 g (08/26/18 1726)  . dextrose 50 mL/hr at 08/27/18 0622     LOS: 6 days    Time spent: 35    Delaine Lame, MD [facility]  If 7PM-7AM, please contact night-coverage [email or web address] Password [email or web address] 08/27/2018, 11:09 AM

## 2018-08-28 DIAGNOSIS — R6520 Severe sepsis without septic shock: Secondary | ICD-10-CM

## 2018-08-28 LAB — BASIC METABOLIC PANEL WITH GFR
Anion gap: 8 (ref 5–15)
CO2: 22 mmol/L (ref 22–32)
Chloride: 104 mmol/L (ref 98–111)
GFR calc Af Amer: 50 mL/min — ABNORMAL LOW (ref >60)
Potassium: 3.1 mmol/L — ABNORMAL LOW (ref 3.5–5.1)

## 2018-08-28 LAB — BASIC METABOLIC PANEL
BUN: 38 mg/dL — ABNORMAL HIGH (ref 8–23)
Calcium: 8.1 mg/dL — ABNORMAL LOW (ref 8.9–10.3)
Creatinine, Ser: 1.23 mg/dL — ABNORMAL HIGH (ref 0.44–1.00)
GFR calc non Af Amer: 43 mL/min — ABNORMAL LOW (ref >60)
Glucose, Bld: 87 mg/dL (ref 70–99)
Sodium: 134 mmol/L — ABNORMAL LOW (ref 135–145)

## 2018-08-28 LAB — GLUCOSE, CAPILLARY
GLUCOSE-CAPILLARY: 149 mg/dL — AB (ref 70–99)
GLUCOSE-CAPILLARY: 54 mg/dL — AB (ref 70–99)
GLUCOSE-CAPILLARY: 94 mg/dL (ref 70–99)
Glucose-Capillary: 135 mg/dL — ABNORMAL HIGH (ref 70–99)
Glucose-Capillary: 61 mg/dL — ABNORMAL LOW (ref 70–99)
Glucose-Capillary: 64 mg/dL — ABNORMAL LOW (ref 70–99)
Glucose-Capillary: 75 mg/dL (ref 70–99)
Glucose-Capillary: 86 mg/dL (ref 70–99)
Glucose-Capillary: 87 mg/dL (ref 70–99)

## 2018-08-28 LAB — CBC
HCT: 30.8 % — ABNORMAL LOW (ref 36.0–46.0)
Hemoglobin: 10.3 g/dL — ABNORMAL LOW (ref 12.0–15.0)
MCH: 28.8 pg (ref 26.0–34.0)
MCHC: 33.4 g/dL (ref 30.0–36.0)
MCV: 86 fL (ref 80.0–100.0)
Platelets: 84 10*3/uL — ABNORMAL LOW (ref 150–400)
RBC: 3.58 MIL/uL — ABNORMAL LOW (ref 3.87–5.11)
RDW: 13.4 % (ref 11.5–15.5)
WBC: 12.7 10*3/uL — ABNORMAL HIGH (ref 4.0–10.5)
nRBC: 0 % (ref 0.0–0.2)

## 2018-08-28 MED ORDER — SODIUM CHLORIDE 0.9 % IV SOLN
2.0000 g | INTRAVENOUS | Status: DC
  Administered 2018-08-28 – 2018-08-29 (×2): 2 g via INTRAVENOUS
  Filled 2018-08-28 (×3): qty 20

## 2018-08-28 MED ORDER — BACLOFEN 5 MG HALF TABLET
10.0000 mg | ORAL_TABLET | Freq: Three times a day (TID) | ORAL | Status: DC
  Administered 2018-08-28 – 2018-08-29 (×5): 10 mg via ORAL
  Filled 2018-08-28 (×5): qty 2

## 2018-08-28 NOTE — Progress Notes (Signed)
[facility] TEAM 1 - Stepdown/ICU TEAM  LEEAN AMEZCUA  ZOX:[id number] DOB: Nov 02, 1946 DOA: 08/21/2018 PCP: Donato Schultz, DO    Brief Narrative:  72yo F w/ a hx of chronic back pain on multiple chronic opiates as well as a spinal stimulator, depression/anxiety, migraines, HTN, and ADHD admitted with altered mental status, acute hypoxic respiratory failure, and sepsis. She was found to have a left obstructing UPJ stone and E. coli sepsis. She is now status post PCN tube on 08/22/2018.  Her course has been complicated by hypoglycemia of unclear etiology as well as thrombocytopenia.  Subjective: Resting comfortably in bed. Remains mildly confused. Denies cp, sob, n/v, or abdom pain.   Assessment & Plan:  E. coli UTI and bacteremia w/ sepsis from obstructing UPJ stone status post PCN tube 08/22/2018 continue IV ceftriaxone for total of 2 weeks - definitive tx of stone per Urology   Acute metabolic encephalopathy likely some withdrawal from pain medication as well as sepsis secondary to E. coli - slowly improving   Hypokalemia Cont to supplement - recheck Mg in AM  Hypoglycemia CBG has stabilized - cont to follow intermittently   Thrombocytopenia Secondary to gram neg rod sepsis  AKI crt is slowly improving   Recent Labs  Lab 08/25/18 1922 08/26/18 0314 08/27/18 0206 08/27/18 1433 08/28/18 0244  CREATININE 1.66* 1.50* 1.38* 1.26* 1.23*    Severe malnutrition in context of chronic illness  Chronic back pain status post spinal simulator/psych  Hypertension BP stable at this time   Migraines  MRSA screen +  DVT prophylaxis: SCDs Code Status: FULL CODE Family Communication: spoke w/ husband at bedside   Disposition Plan: stable for transfer to tele bed   Consultants:   PCCM  Interventional radiology  Urology  Antimicrobials:  Rocephin 10/16 >  Objective: Blood pressure (!) 98/56, pulse 94, temperature (!) 97.5 F (36.4 C), temperature source  Oral, resp. rate (!) 23, height 5\' 5"  (1.651 m), weight 66.8 kg, SpO2 100 %.  Intake/Output Summary (Last 24 hours) at 08/28/2018 1131 Last data filed at 08/28/2018 0846 Gross per 24 hour  Intake 100 ml  Output 1651 ml  Net -1551 ml   Filed Weights   08/26/18 0414 08/27/18 0500 08/28/18 0300  Weight: 67.9 kg 66.2 kg 66.8 kg    Examination: General: No acute respiratory distress Lungs: Clear to auscultation bilaterally without wheezes or crackles Cardiovascular: Regular rate and rhythm without murmur gallop or rub normal S1 and S2 Abdomen: Nontender, nondistended, soft, bowel sounds positive, no rebound, no ascites, no appreciable mass Extremities: No significant cyanosis, clubbing, or edema bilateral lower extremities  CBC: Recent Labs  Lab 08/23/18 0816  08/26/18 0546 08/26/18 0839 08/27/18 0206 08/28/18 0244  WBC 15.4*   < > 15.4*  --  13.0* 12.7*  NEUTROABS 14.1*  --   --   --  10.7*  --   HGB 12.0   < > 11.7*  --  10.0* 10.3*  HCT 39.2   < > 35.7*  --  29.8* 30.8*  MCV 99.0   < > 87.7  --  86.4 86.0  PLT 27*   < > 22* 26* 36* 84*   < > = values in this interval not displayed.   Basic Metabolic Panel: Recent Labs  Lab 08/24/18 0252 08/25/18 0451  08/26/18 0314 08/27/18 0206 08/27/18 1433 08/28/18 0244  NA 139 135   < > 134* 133* 133* 134*  K 3.4* 2.0*   < > 3.7  2.3* 2.9* 3.1*  CL 113* 107   < > 104 101 103 104  CO2 17* 19*   < > 17* 23 22 22   GLUCOSE 108* 112*   < > 97 101* 92 87  BUN 48* 51*   < > 45* 41* 39* 38*  CREATININE 1.98* 1.84*   < > 1.50* 1.38* 1.26* 1.23*  CALCIUM 8.2* 8.0*   < > 7.8* 7.8* 8.1* 8.1*  MG 1.9 1.5*  --  2.1 1.8  --   --   PHOS 2.6 3.8  --  3.2  --   --   --    < > = values in this interval not displayed.   GFR: Estimated Creatinine Clearance: 38.3 mL/min (A) (by C-G formula based on SCr of 1.23 mg/dL (H)).  Liver Function Tests: Recent Labs  Lab 08/22/18 0016 08/27/18 0206  AST  --  29  ALT  --  11  ALKPHOS  --  83    BILITOT  --  1.0  PROT  --  4.1*  ALBUMIN 2.2* 1.5*    Coagulation Profile: Recent Labs  Lab 08/21/18 1815 08/22/18 0016 08/26/18 0839  INR 1.87 1.80 2.80    Cardiac Enzymes: Recent Labs  Lab 08/21/18 1815 08/22/18 0016 08/22/18 0612  TROPONINI 0.05* 0.05* 0.04*    HbA1C: Hgb A1c MFr Bld  Date/Time Value Ref Range Status  04/22/2014 11:53 AM 5.3 4.6 - 6.5 % Final    Comment:    Glycemic Control Guidelines for People with Diabetes:Non Diabetic:  <6%Goal of Therapy: <7%Additional Action Suggested:  >8%     CBG: Recent Labs  Lab 08/27/18 2006 08/28/18 0005 08/28/18 0416 08/28/18 0541 08/28/18 0721  GLUCAP 79 86 64* 135* 149*    Recent Results (from the past 240 hour(s))  Blood culture (routine x 2)     Status: Abnormal   Collection Time: 08/21/18 11:02 AM  Result Value Ref Range Status   Specimen Description BLOOD RIGHT HAND  Final   Special Requests   Final    BOTTLES DRAWN AEROBIC AND ANAEROBIC Blood Culture results may not be optimal due to an inadequate volume of blood received in culture bottles   Culture  Setup Time   Final    GRAM NEGATIVE RODS IN BOTH AEROBIC AND ANAEROBIC BOTTLES Organism ID to follow CRITICAL RESULT CALLED TO, READ BACK BY AND VERIFIED WITHMelven [facility] 1610 08/22/18 A BROWNING Performed at [facility], [address], [place], [place] [address]    Culture ESCHERICHIA COLI (A)  Final   Report Status 08/24/2018 FINAL  Final   Organism ID, Bacteria ESCHERICHIA COLI  Final      Susceptibility   Escherichia coli - MIC*    AMPICILLIN >=32 RESISTANT Resistant     CEFAZOLIN <=4 SENSITIVE Sensitive     CEFEPIME <=1 SENSITIVE Sensitive     CEFTAZIDIME <=1 SENSITIVE Sensitive     CEFTRIAXONE <=1 SENSITIVE Sensitive     CIPROFLOXACIN 0.5 SENSITIVE Sensitive     GENTAMICIN <=1 SENSITIVE Sensitive     IMIPENEM <=0.25 SENSITIVE Sensitive     TRIMETH/SULFA >=320 RESISTANT Resistant     AMPICILLIN/SULBACTAM >=32 RESISTANT  Resistant     PIP/TAZO <=4 SENSITIVE Sensitive     Extended ESBL NEGATIVE Sensitive     * ESCHERICHIA COLI  Blood Culture ID Panel (Reflexed)     Status: Abnormal   Collection Time: 08/21/18 11:02 AM  Result Value Ref Range Status   Enterococcus  species NOT DETECTED NOT DETECTED Final   Vancomycin resistance NOT DETECTED NOT DETECTED Final   Listeria monocytogenes NOT DETECTED NOT DETECTED Final   Staphylococcus species NOT DETECTED NOT DETECTED Final   Staphylococcus aureus (BCID) NOT DETECTED NOT DETECTED Final   Methicillin resistance NOT DETECTED NOT DETECTED Final   Streptococcus species NOT DETECTED NOT DETECTED Final   Streptococcus agalactiae NOT DETECTED NOT DETECTED Final   Streptococcus pneumoniae NOT DETECTED NOT DETECTED Final   Streptococcus pyogenes NOT DETECTED NOT DETECTED Final   Acinetobacter baumannii NOT DETECTED NOT DETECTED Final   Enterobacteriaceae species DETECTED (A) NOT DETECTED Final    Comment: CRITICAL RESULT CALLED TO, READ BACK BY AND VERIFIED WITHMelven Sartorius PHARMD 1610 08/22/18 A BROWNING Enterobacteriaceae represent a large family of gram-negative bacteria, not a single organism.    Enterobacter cloacae complex NOT DETECTED NOT DETECTED Final   Escherichia coli DETECTED (A) NOT DETECTED Final    Comment: CRITICAL RESULT CALLED TO, READ BACK BY AND VERIFIED WITH: Melven Sartorius [facility] 9604 08/22/18 A BROWNING    Klebsiella oxytoca NOT DETECTED NOT DETECTED Final   Klebsiella pneumoniae NOT DETECTED NOT DETECTED Final   Proteus species NOT DETECTED NOT DETECTED Final   Serratia marcescens NOT DETECTED NOT DETECTED Final   Carbapenem resistance NOT DETECTED NOT DETECTED Final   Haemophilus influenzae NOT DETECTED NOT DETECTED Final   Neisseria meningitidis NOT DETECTED NOT DETECTED Final   Pseudomonas aeruginosa NOT DETECTED NOT DETECTED Final   Candida albicans NOT DETECTED NOT DETECTED Final   Candida glabrata NOT DETECTED NOT DETECTED Final    Candida krusei NOT DETECTED NOT DETECTED Final   Candida parapsilosis NOT DETECTED NOT DETECTED Final   Candida tropicalis NOT DETECTED NOT DETECTED Final    Comment: Performed at [facility], [address] [place], [place] [address]  Blood culture (routine x 2)     Status: Abnormal   Collection Time: 08/21/18 11:30 AM  Result Value Ref Range Status   Specimen Description BLOOD LEFT HAND  Final   Special Requests   Final    BOTTLES DRAWN AEROBIC AND ANAEROBIC Blood Culture results may not be optimal due to an inadequate volume of blood received in culture bottles   Culture  Setup Time   Final    GRAM NEGATIVE RODS IN BOTH AEROBIC AND ANAEROBIC BOTTLES CRITICAL RESULT CALLED TO, READ BACK BY AND VERIFIED WITHShela Commons [facility] PHARMD 1191 08/22/18 A BROWNING    Culture (A)  Final    ESCHERICHIA COLI SUSCEPTIBILITIES PERFORMED ON PREVIOUS CULTURE WITHIN THE LAST 5 DAYS. Performed at [facility], [address], [place], [place] [address]    Report Status 08/24/2018 FINAL  Final  Urine culture     Status: Abnormal   Collection Time: 08/21/18 12:33 PM  Result Value Ref Range Status   Specimen Description URINE, CATHETERIZED  Final   Special Requests   Final    NONE Performed at [facility], [address], [place], [place] [address]    Culture >=100,000 COLONIES/mL ESCHERICHIA COLI (A)  Final   Report Status 08/23/2018 FINAL  Final   Organism ID, Bacteria ESCHERICHIA COLI (A)  Final      Susceptibility   Escherichia coli - MIC*    AMPICILLIN >=32 RESISTANT Resistant     CEFAZOLIN <=4 SENSITIVE Sensitive     CEFTRIAXONE <=1 SENSITIVE Sensitive     CIPROFLOXACIN 0.5 SENSITIVE Sensitive     GENTAMICIN <=1 SENSITIVE Sensitive  IMIPENEM <=0.25 SENSITIVE Sensitive     NITROFURANTOIN <=16 SENSITIVE Sensitive     TRIMETH/SULFA >=320 RESISTANT Resistant     AMPICILLIN/SULBACTAM >=32 RESISTANT Resistant     PIP/TAZO <=4 SENSITIVE Sensitive     Extended ESBL NEGATIVE  Sensitive     * >=100,000 COLONIES/mL ESCHERICHIA COLI  MRSA PCR Screening     Status: Abnormal   Collection Time: 08/21/18  5:53 PM  Result Value Ref Range Status   MRSA by PCR POSITIVE (A) NEGATIVE Final    Comment:        The GeneXpert MRSA Assay (FDA approved for NASAL specimens only), is one component of a comprehensive MRSA colonization surveillance program. It is not intended to diagnose MRSA infection nor to guide or monitor treatment for MRSA infections. RESULT CALLED TO, READ BACK BY AND VERIFIED WITH: Tommie Sams RN 08/21/18 1936 JDW Performed at [facility], [address], [place], [place] [address]   Urine Culture     Status: Abnormal   Collection Time: 08/22/18  9:22 AM  Result Value Ref Range Status   Specimen Description KIDNEY LEFT  Final   Special Requests   Final    NONE Performed at [facility], [address] [place], [place] [address]    Culture >=100,000 COLONIES/mL ESCHERICHIA COLI (A)  Final   Report Status 08/24/2018 FINAL  Final   Organism ID, Bacteria ESCHERICHIA COLI (A)  Final      Susceptibility   Escherichia coli - MIC*    AMPICILLIN >=32 RESISTANT Resistant     CEFAZOLIN 8 SENSITIVE Sensitive     CEFEPIME <=1 SENSITIVE Sensitive     CEFTAZIDIME <=1 SENSITIVE Sensitive     CEFTRIAXONE <=1 SENSITIVE Sensitive     CIPROFLOXACIN 0.5 SENSITIVE Sensitive     GENTAMICIN <=1 SENSITIVE Sensitive     IMIPENEM <=0.25 SENSITIVE Sensitive     TRIMETH/SULFA >=320 RESISTANT Resistant     AMPICILLIN/SULBACTAM >=32 RESISTANT Resistant     PIP/TAZO 16 SENSITIVE Sensitive     Extended ESBL NEGATIVE Sensitive     * >=100,000 COLONIES/mL ESCHERICHIA COLI     Scheduled Meds: . amitriptyline  75 mg Oral QHS  . amphetamine-dextroamphetamine  20 mg Oral Daily  . chlorhexidine  15 mL Mouth Rinse BID  . famotidine  20 mg Oral Daily  . mouth rinse  15 mL Mouth Rinse q12n4p  . potassium chloride  40 mEq Oral BID  . sodium chloride flush  5 mL  Intracatheter Q12H  . venlafaxine XR  225 mg Oral Daily   Continuous Infusions: . sodium chloride Stopped (08/26/18 0753)  . cefTRIAXone (ROCEPHIN)  IV       LOS: 7 days   Lonia Blood, MD [facility] Office  [phone] Pager - Text Page per Amion  If 7PM-7AM, please contact night-coverage per Amion 08/28/2018, 11:31 AM

## 2018-08-28 NOTE — Care Management Important Message (Signed)
Important Message  Patient Details  Name: [name] MRN: [id number] Date of Birth: 1946/04/20   Medicare Important Message Given:  Yes    Leone Haven, RN 08/28/2018, 4:41 PM

## 2018-08-28 NOTE — Progress Notes (Signed)
Pasrr: [id number]  Cristobal Goldmann LCSW [phone]

## 2018-08-28 NOTE — Clinical Social Work Note (Signed)
Clinical Social Work Assessment  Patient Details  Name: [name] MRN: [id number] Date of Birth: September 19, 1946  Date of referral:  08/27/18               Reason for consult:  Facility Placement                Permission sought to share information with:  Facility Medical sales representative, Family Supports Permission granted to share information::  Yes, Verbal Permission Granted  Name::     [name]  Agency::  SNF  Relationship::  Spouse  Contact Information:  [phone]  Housing/Transportation Living arrangements for the past 2 months:  Single Family Home Source of Information:  Patient, Spouse Patient Interpreter Needed:  None Criminal Activity/Legal Involvement Pertinent to Current Situation/Hospitalization:  No - Comment as needed Significant Relationships:  Spouse, Adult Children Lives with:  Spouse Do you feel safe going back to the place where you live?  No Need for family participation in patient care:  Yes (Comment)  Care giving concerns:  CSW received consult for possible SNF placement at time of discharge. CSW spoke with patient and spouse regarding PT recommendation of SNF placement at time of discharge. Patient's spouse reported that patient's spouse is currently unable to care for patient at their home given patient's current physical needs and fall risk. Patient expressed understanding of PT recommendation and is agreeable to SNF placement at time of discharge. CSW to continue to follow and assist with discharge planning needs.   Social Worker assessment / plan:  CSW spoke with patient and spouse concerning possibility of rehab at [facility] before returning home.  Employment status:  Retired Database administrator PT Recommendations:  Skilled Nursing Facility Information / Referral to community resources:  Skilled Nursing Facility  Patient/Family's Response to care:  Patient recognizes need for rehab before returning home and is agreeable to a SNF in [place]. MD explained that CIR was not recommended for patient. Patient's spouse reported preference for SNF in [facility]. Patient's spouse stated that he had to get on a plane on Tuesday so he wanted the patient's plan in place before he leaves. CSW explained insurance authorization process, which may take several days.   Patient/Family's Understanding of and Emotional Response to Diagnosis, Current Treatment, and Prognosis:  Patient/family is realistic regarding therapy needs and expressed being hopeful for SNF placement. Patient expressed understanding of CSW role and discharge process as well as medical condition. No questions/concerns about plan or treatment.    Emotional Assessment Appearance:  Appears stated age Attitude/Demeanor/Rapport:  Gracious Affect (typically observed):  Accepting, Appropriate Orientation:  Oriented to Self, Oriented to Place Alcohol / Substance use:  Not Applicable Psych involvement (Current and /or in the community):  No (Comment)  Discharge Needs  Concerns to be addressed:  Care Coordination Readmission within the last 30 days:  No Current discharge risk:  None Barriers to Discharge:  Continued Medical Work up   [employer], LCSW 08/28/2018, 4:31 PM

## 2018-08-28 NOTE — Progress Notes (Signed)
  Speech Language Pathology Treatment: Dysphagia  Patient Details Name: [name] MRN: [id number] DOB: [birth year] Today's Date: 08/28/2018 Time: 4098-1191 SLP Time Calculation (min) (ACUTE ONLY): 9 min  Assessment / Plan / Recommendation Clinical Impression  Pt would only consume small amounts of solid foods, but with slowed bolus preparation likely within gross functional limits considering her lack of dentition. Min cues were also given for sustained attention. She says that she typically wears dentures, but has not been recently because she lost her bottom set. Although it is challenging to get a clear history, it appears as though these chopped textures are most consistent with what she would eat PTA. Recommend continuing Dys 2 diet and thin liquids. No further acute SLP needs identified at this time, although could consider diet advancement if pt obtains her dentures. SLP to sign off for now.   HPI HPI: 72 year old female admitted 08/21/18, found down with obstructive uropathy, left hydro, PNA and septic shock/encephalopathy and AKI. PMH: recent MRSA skin infection, fibromyalgia, anemia, HTN, chronic pain on narcotics. CXR = slightly more dense appearance of right lung PNA, LLL atelectasis vs PNA stable-slightly improved      SLP Plan  All goals met       Recommendations  Diet recommendations: Dysphagia 2 (fine chop);Thin liquid Liquids provided via: Cup;Straw Medication Administration: Whole meds with liquid Supervision: Patient able to self feed;Intermittent supervision to cue for compensatory strategies Compensations: Minimize environmental distractions;Slow rate;Small sips/bites Postural Changes and/or Swallow Maneuvers: Seated upright 90 degrees                Oral Care Recommendations: Oral care BID Follow up Recommendations: 24 hour supervision/assistance SLP Visit Diagnosis: Dysphagia, unspecified (R13.10) Plan: All goals met       GO                 Germain Osgood 08/28/2018, 2:40 PM  Germain Osgood, M.A. [place] Acute Environmental education officer [phone] Office [phone]

## 2018-08-28 NOTE — Care Management Note (Signed)
Case Management Note  Patient Details  Name: [name] MRN: [id number] Date of Birth: September 19, 1946  Subjective/Objective:    Transfer from ICU, from home with spouse, sepsis (ecoli), frm obstructing upj stone s/p pcn on 10/15, met enceph, hypokalemia, hypoglycemia, thrombocytopenia, aki, chronic back pain, htn. Pt eval rec SNF. CSW referral.                Action/Plan: NCM will follow for transition of care needs.  Expected Discharge Date:  08/29/18               Expected Discharge Plan:  Skilled Nursing Facility(From home with husband)  In-House Referral:  Clinical Social Work  Discharge planning Services  CM Consult  Post Acute Care Choice:    Choice offered to:     DME Arranged:    DME Agency:     HH Arranged:    [place] Agency:     Status of Service:  In process, will continue to follow  If discussed at Long Length of Stay Meetings, dates discussed:    Additional Comments:  Zenon Mayo, RN 08/28/2018, 9:04 AM

## 2018-08-28 NOTE — Progress Notes (Signed)
Nutrition Follow-up  DOCUMENTATION CODES:   Severe malnutrition in context of chronic illness  INTERVENTION:   - Magic Cup TID (each provides 290 kcal, 9 g protein) - Suggest checking labs for additional vitamin / mineral deficiencies (copper, zinc, thiamine, folate, vitamin D, iron, calcium)   NUTRITION DIAGNOSIS:   Severe Malnutrition related to chronic illness(suspected malabsorption from bariatric surgery in 2008) as evidenced by severe fat depletion, severe muscle depletion, percent weight loss(22% weight loss in one year).  GOAL:   Patient will meet greater than or equal to 90% of their needs  10/21 - not meeting   MONITOR:   Diet advancement, PO intake, Labs   Diet advanced from NPO to CL to Dysphagia 2 on 10/18 PO intake 10-25%, per nsg; continue to monitor Labs: sodium 134, potassium 3.1, BUN 38, Creatinine 1.23, GFR 43/50, Hgb 10.3, Hct 30.8  ASSESSMENT:   72 yo female with PMH of recent skin infection, fibromyalgia, HLD, ADD, GERD, osteoporosis, PICA, bariatric surgery 2008, HTN, essential tremor, and B-12 deficiency, who was admitted on 10/14 with obstructive uropathy (UPJ stone), PNA, severe sepsis, AKI. Nephrostomy tube placed on 10/15.  At today's follow up, pt endorses early satiety and some improvement in appetite. Reports nausea at the smell of heavy, rich foods (eg, gravy). Has been receiving house trays and would like to choose what foods she gets with each meal. Agreeable to Magic Cup TID in vanilla and orange creme. Encouraged pt to eat protein with every meal and to start with protein food if not feeling hungry.  Diet Order:  10-25% PO intake, per nsg Diet Order            DIET DYS 2 Room service appropriate? Yes; Fluid consistency: Thin  Diet effective now             EDUCATION NEEDS:  No education needs have been identified at this time  Skin:  Skin Assessment: Skin Integrity Issues: Skin Integrity Issues:: DTI DTI: R lower back, R  heel  Last BM:  10/18, type 4  Height:  Ht Readings from Last 1 Encounters:  08/21/18 5\' 5"  (1.651 m)    Weight:  Wt Readings from Last 1 Encounters:  08/28/18 66.8 kg    Ideal Body Weight:  56.8 kg  BMI:  Body mass index is 24.51 kg/m.  Estimated Nutritional Needs:   Kcal:  1650-1850  Protein:  95-110 gm  Fluid:  1.8 L  Jolaine Artist, MS, RDN, LDN On-call pager: [phone]

## 2018-08-28 NOTE — Progress Notes (Signed)
Referring Physician(s): Herrick,B  Supervising Physician: Richarda Overlie  Patient Status:  [facility] - In-pt  Chief Complaint: Left renal stone/hydronephrosis/UTI   Subjective: Pt resting quietly in bed; no acute c/o   Allergies: Patient has no known allergies.  Medications: Prior to Admission medications   Medication Sig Start Date End Date Taking? Authorizing Provider  amitriptyline (ELAVIL) 75 MG tablet Take 75 mg by mouth at bedtime. 08/10/18  Yes [provider]  amLODipine (NORVASC) 5 MG tablet Take 1 tablet (5 mg total) by mouth daily. 08/10/18  Yes Lars Masson, MD  amphetamine-dextroamphetamine (ADDERALL XR) 20 MG 24 hr capsule 2 po qam Patient taking differently: Take 20 mg by mouth daily.  08/11/18  Yes Wendling, Jilda Roche, DO  baclofen (LIORESAL) 10 MG tablet Take 10 mg by mouth 3 (three) times daily. 06/12/18  Yes [provider]  clonazePAM (KLONOPIN) 0.5 MG tablet Take 0.5 tablets (0.25 mg total) by mouth daily as needed for anxiety. 08/11/18  Yes Wendling, Jilda Roche, DO  Cyanocobalamin (VITAMIN B-12) 1000 MCG SUBL Place 10,000 mcg under the tongue daily at 12 noon.   Yes [provider]  ibuprofen (ADVIL,MOTRIN) 200 MG tablet Take 200 mg by mouth as needed.   Yes [provider]  morphine (MSIR) 15 MG tablet Take 15 mg by mouth every 12 (twelve) hours as needed for severe pain (for pain).   Yes [provider]  Oxycodone HCl 20 MG TABS Take 1 tablet by mouth 4 (four) times daily as needed (pain).  03/06/15  Yes [provider]  SUMAtriptan (IMITREX) 50 MG tablet Take 1 tablet (50 mg total) by mouth every 2 (two) hours as needed for migraine. May repeat in 2 hours if headache persists or recurs. 04/12/18  Yes [name] R, DO  venlafaxine XR (EFFEXOR-XR) 75 MG 24 hr capsule Take 3 capsules (225 mg total) by mouth daily. 02/07/18  Yes Donato Schultz, DO  doxycycline (VIBRA-TABS) 100 MG tablet Take 1  tablet (100 mg total) by mouth 2 (two) times daily. Patient not taking: Reported on 08/21/2018 07/13/18   Zola Button, Grayling Congress, DO  omeprazole (PRILOSEC) 20 MG capsule Take 1 capsule (20 mg total) by mouth daily. Patient not taking: Reported on 08/21/2018 02/07/18   Zola Button, Grayling Congress, DO     Vital Signs: BP (!) 98/56 (BP Location: Left Arm)   Pulse 94   Temp (!) 97.5 F (36.4 C) (Oral)   Resp (!) 23   Ht 5\' 5"  (1.651 m)   Wt 147 lb 4.3 oz (66.8 kg)   SpO2 100%   BMI 24.51 kg/m   Physical Exam left PCN intact, bag full of tea colored urine; dressing dry, site NT  Imaging: Dg Chest Port 1 View  Result Date: 08/26/2018 CLINICAL DATA:  Tachypnea this morning. EXAM: PORTABLE CHEST 1 VIEW COMPARISON:  08/24/2018 FINDINGS: Lungs are hypoinflated with persistent patchy bilateral airspace process right worse than left with mild improved aeration over the right mid to upper lung. No definite effusion. Cardiomediastinal silhouette and remainder of the exam is unchanged. IMPRESSION: Mild interval improvement and patchy bilateral multifocal airspace process right worse than left likely improving multifocal pneumonia. Electronically Signed   By: Elberta Fortis M.D.   On: 08/26/2018 11:05    Labs:  CBC: Recent Labs    08/25/18 0451 08/26/18 0546 08/26/18 0839 08/27/18 0206 08/28/18 0244  WBC 16.3* 15.4*  --  13.0* 12.7*  HGB 10.3* 11.7*  --  10.0* 10.3*  HCT 30.2* 35.7*  --  29.8* 30.8*  PLT 16* 22* 26* 36* 84*    COAGS: Recent Labs    08/21/18 1815 08/22/18 0016 08/26/18 0839  INR 1.87 1.80 2.80  APTT  --   --  57*    BMP: Recent Labs    08/26/18 0314 08/27/18 0206 08/27/18 1433 08/28/18 0244  NA 134* 133* 133* 134*  K 3.7 2.3* 2.9* 3.1*  CL 104 101 103 104  CO2 17* 23 22 22   GLUCOSE 97 101* 92 87  BUN 45* 41* 39* 38*  CALCIUM 7.8* 7.8* 8.1* 8.1*  CREATININE 1.50* 1.38* 1.26* 1.23*  GFRNONAA 34* 38* 42* 43*  GFRAA 40* 44* 49* 50*    LIVER FUNCTION  TESTS: Recent Labs    08/21/18 1057 08/22/18 0016 08/27/18 0206  BILITOT 0.8  --  1.0  AST 51*  --  29  ALT 16  --  11  ALKPHOS 58  --  83  PROT 4.1*  --  4.1*  ALBUMIN 2.0* 2.2* 1.5*    Assessment and Plan: Pt with hx obstructive left hydronephrosis(stone), sepsis(e coli); s/p left PCN 10/15; afebrile; creat 1.23(1.26), K 3.1- replace; WBC 12.7(13), hgb 10.3, plts 84k, cx- e coli; maintain PCN as per urology plans; additional plans as per IM   Electronically Signed: D. Jeananne Rama, PA-C 08/28/2018, 8:41 AM   I spent a total of 15 minutes at the the patient's bedside AND on the patient's hospital floor or unit, greater than 50% of which was counseling/coordinating care for left nephrostomy    Patient ID: [name], female   DOB: 03/15/1946, 72 y.o.   MRN: [id number]

## 2018-08-29 DIAGNOSIS — J181 Lobar pneumonia, unspecified organism: Secondary | ICD-10-CM

## 2018-08-29 LAB — CBC
HEMATOCRIT: 28.6 % — AB (ref 36.0–46.0)
HEMOGLOBIN: 9.4 g/dL — AB (ref 12.0–15.0)
MCH: 29.1 pg (ref 26.0–34.0)
MCHC: 32.9 g/dL (ref 30.0–36.0)
MCV: 88.5 fL (ref 80.0–100.0)
Platelets: 121 10*3/uL — ABNORMAL LOW (ref 150–400)
RBC: 3.23 MIL/uL — AB (ref 3.87–5.11)
RDW: 13.7 % (ref 11.5–15.5)
WBC: 11.2 10*3/uL — ABNORMAL HIGH (ref 4.0–10.5)
nRBC: 0 % (ref 0.0–0.2)

## 2018-08-29 LAB — GLUCOSE, CAPILLARY
GLUCOSE-CAPILLARY: 84 mg/dL (ref 70–99)
GLUCOSE-CAPILLARY: 66 mg/dL — AB (ref 70–99)
GLUCOSE-CAPILLARY: 85 mg/dL (ref 70–99)
GLUCOSE-CAPILLARY: 90 mg/dL (ref 70–99)
Glucose-Capillary: 71 mg/dL (ref 70–99)
Glucose-Capillary: 71 mg/dL (ref 70–99)
Glucose-Capillary: 82 mg/dL (ref 70–99)
Glucose-Capillary: 96 mg/dL (ref 70–99)

## 2018-08-29 LAB — COMPREHENSIVE METABOLIC PANEL
ALBUMIN: 1.7 g/dL — AB (ref 3.5–5.0)
ALT: 22 U/L (ref 0–44)
AST: 68 U/L — AB (ref 15–41)
Alkaline Phosphatase: 110 U/L (ref 38–126)
Anion gap: 5 (ref 5–15)
BILIRUBIN TOTAL: 0.6 mg/dL (ref 0.3–1.2)
BUN: 38 mg/dL — AB (ref 8–23)
CHLORIDE: 108 mmol/L (ref 98–111)
CO2: 22 mmol/L (ref 22–32)
Calcium: 8.1 mg/dL — ABNORMAL LOW (ref 8.9–10.3)
Creatinine, Ser: 1.1 mg/dL — ABNORMAL HIGH (ref 0.44–1.00)
GFR calc Af Amer: 58 mL/min — ABNORMAL LOW (ref >60)
GFR calc non Af Amer: 50 mL/min — ABNORMAL LOW (ref >60)
GLUCOSE: 82 mg/dL (ref 70–99)
POTASSIUM: 4.4 mmol/L (ref 3.5–5.1)
Sodium: 135 mmol/L (ref 135–145)
TOTAL PROTEIN: 4.7 g/dL — AB (ref 6.5–8.1)

## 2018-08-29 LAB — MAGNESIUM: Magnesium: 1.9 mg/dL (ref 1.7–2.4)

## 2018-08-29 MED ORDER — ENOXAPARIN SODIUM 40 MG/0.4ML ~~LOC~~ SOLN
40.0000 mg | SUBCUTANEOUS | Status: DC
  Administered 2018-08-29 – 2018-09-01 (×4): 40 mg via SUBCUTANEOUS
  Filled 2018-08-29 (×5): qty 0.4

## 2018-08-29 NOTE — Clinical Social Work Note (Signed)
At this time [facility] has made a offer--this was a facility preferred via Son. [facility] will start [employer] on Thursday with an anticipated d/c on Thursday.   [place], [place] [phone]

## 2018-08-29 NOTE — Progress Notes (Signed)
[name]  GNF:[id number] DOB: 1946/04/17 DOA: 08/21/2018 PCP: Donato Schultz, DO    Brief Narrative:  72yo F w/ a hx of chronic back pain on multiple chronic opiates as well as a spinal stimulator, depression/anxiety, migraines, HTN, and ADHD admitted with altered mental status, acute hypoxic respiratory failure, and sepsis. She was found to have a left obstructing UPJ stone and E. coli sepsis. She is now status post PCN tube on 08/22/2018.  Her course has been complicated by hypoglycemia of unclear etiology as well as thrombocytopenia. -Initially in ICU, subsequently transferred to stepdown, transferred from [facility] to floor today   Subjective: -Complains of chronic aches and pains -Oral intake is poor  Assessment & Plan:  E. coli Sepsis with bacteremia -Secondary to obstructing UPJ stone -Status post percutaneous nephrostomy tube by interventional radiology on 10/15 -Seen by urology, recommended 2 weeks of antibiotics followed by urology follow-up for definitive stone management -EColi was resistant to ampicillin, Bactrim -No fevers, leukocytosis has improved -Physical therapy evaluation -Discharge planning  Acute metabolic encephalopathy -Likely due to sepsis and some withdrawal from pain medication -Improving slowly -OT eval   Hypokalemia -Replaced  Hypoglycemia -Likely secondary to gram-negative sepsis -Improved  Thrombocytopenia Secondary to gram neg rod sepsis -Improving  AKI -Also due to sepsis, creatinine slowly improving down to 1.1 today  Severe malnutrition - in context of chronic illness -Continue supplements as tolerated  Chronic back pain - status post spinal simulator/psych -Continue baclofen and oxycodone per home regimen -Will discontinue fentanyl  Hypertension BP stable at this time   Migraines  MRSA screen +  DVT prophylaxis: add Lovenox, monitor platelets Code Status: FULL CODE Family Communication: no Family at  bedside Disposition Plan: Anticipate she may need SNF for rehab  Consultants:   PCCM  Interventional radiology  Urology  Antimicrobials:  Rocephin 10/16 >  Objective: Blood pressure 126/71, pulse 92, temperature 98.4 F (36.9 C), temperature source Oral, resp. rate (!) 24, height 5\' 5"  (1.651 m), weight 68.3 kg, SpO2 99 %.  Intake/Output Summary (Last 24 hours) at 08/29/2018 1006 Last data filed at 08/29/2018 0850 Gross per 24 hour  Intake 480 ml  Output 1225 ml  Net -745 ml   Filed Weights   08/27/18 0500 08/28/18 0300 08/29/18 0213  Weight: 66.2 kg 66.8 kg 68.3 kg    Examination: Gen: Cachectic, chronically ill-appearing female, laying in bed, no distress HEENT: PERRLA Lungs: Distant breath sounds CVS: 1 S2/regular rate rhythm Abd: soft, Non tender, non distended, BS present Extremities: No edema Skin: no new rashes  CBC: Recent Labs  Lab 08/23/18 0816  08/27/18 0206 08/28/18 0244 08/29/18 0139  WBC 15.4*   < > 13.0* 12.7* 11.2*  NEUTROABS 14.1*  --  10.7*  --   --   HGB 12.0   < > 10.0* 10.3* 9.4*  HCT 39.2   < > 29.8* 30.8* 28.6*  MCV 99.0   < > 86.4 86.0 88.5  PLT 27*   < > 36* 84* 121*   < > = values in this interval not displayed.   Basic Metabolic Panel: Recent Labs  Lab 08/24/18 0252 08/25/18 0451  08/26/18 0314 08/27/18 0206 08/27/18 1433 08/28/18 0244 08/29/18 0139  NA 139 135   < > 134* 133* 133* 134* 135  K 3.4* 2.0*   < > 3.7 2.3* 2.9* 3.1* 4.4  CL 113* 107   < > 104 101 103 104 108  CO2 17* 19*   < >  17* 23 22 22 22   GLUCOSE 108* 112*   < > 97 101* 92 87 82  BUN 48* 51*   < > 45* 41* 39* 38* 38*  CREATININE 1.98* 1.84*   < > 1.50* 1.38* 1.26* 1.23* 1.10*  CALCIUM 8.2* 8.0*   < > 7.8* 7.8* 8.1* 8.1* 8.1*  MG 1.9 1.5*  --  2.1 1.8  --   --  1.9  PHOS 2.6 3.8  --  3.2  --   --   --   --    < > = values in this interval not displayed.   GFR: Estimated Creatinine Clearance: 42.8 mL/min (A) (by C-G formula based on SCr of 1.1 mg/dL  (H)).  Liver Function Tests: Recent Labs  Lab 08/27/18 0206 08/29/18 0139  AST 29 68*  ALT 11 22  ALKPHOS 83 110  BILITOT 1.0 0.6  PROT 4.1* 4.7*  ALBUMIN 1.5* 1.7*    Coagulation Profile: Recent Labs  Lab 08/26/18 0839  INR 2.80    Cardiac Enzymes: No results for input(s): CKTOTAL, CKMB, CKMBINDEX, TROPONINI in the last 168 hours.  HbA1C: Hgb A1c MFr Bld  Date/Time Value Ref Range Status  04/22/2014 11:53 AM 5.3 4.6 - 6.5 % Final    Comment:    Glycemic Control Guidelines for People with Diabetes:Non Diabetic:  <6%Goal of Therapy: <7%Additional Action Suggested:  >8%     CBG: Recent Labs  Lab 08/28/18 2033 08/29/18 0111 08/29/18 0416 08/29/18 0456 08/29/18 0808  GLUCAP 94 84 66* 85 90    Recent Results (from the past 240 hour(s))  Blood culture (routine x 2)     Status: Abnormal   Collection Time: 08/21/18 11:02 AM  Result Value Ref Range Status   Specimen Description BLOOD RIGHT HAND  Final   Special Requests   Final    BOTTLES DRAWN AEROBIC AND ANAEROBIC Blood Culture results may not be optimal due to an inadequate volume of blood received in culture bottles   Culture  Setup Time   Final    GRAM NEGATIVE RODS IN BOTH AEROBIC AND ANAEROBIC BOTTLES Organism ID to follow CRITICAL RESULT CALLED TO, READ BACK BY AND VERIFIED WITHMelven Sartorius [facility] 1610 08/22/18 A BROWNING Performed at [facility], [address] [place], [place] [address]    Culture ESCHERICHIA COLI (A)  Final   Report Status 08/24/2018 FINAL  Final   Organism ID, Bacteria ESCHERICHIA COLI  Final      Susceptibility   Escherichia coli - MIC*    AMPICILLIN >=32 RESISTANT Resistant     CEFAZOLIN <=4 SENSITIVE Sensitive     CEFEPIME <=1 SENSITIVE Sensitive     CEFTAZIDIME <=1 SENSITIVE Sensitive     CEFTRIAXONE <=1 SENSITIVE Sensitive     CIPROFLOXACIN 0.5 SENSITIVE Sensitive     GENTAMICIN <=1 SENSITIVE Sensitive     IMIPENEM <=0.25 SENSITIVE Sensitive     TRIMETH/SULFA >=320  RESISTANT Resistant     AMPICILLIN/SULBACTAM >=32 RESISTANT Resistant     PIP/TAZO <=4 SENSITIVE Sensitive     Extended ESBL NEGATIVE Sensitive     * ESCHERICHIA COLI  Blood Culture ID Panel (Reflexed)     Status: Abnormal   Collection Time: 08/21/18 11:02 AM  Result Value Ref Range Status   Enterococcus species NOT DETECTED NOT DETECTED Final   Vancomycin resistance NOT DETECTED NOT DETECTED Final   Listeria monocytogenes NOT DETECTED NOT DETECTED Final   Staphylococcus species NOT DETECTED NOT DETECTED Final  Staphylococcus aureus (BCID) NOT DETECTED NOT DETECTED Final   Methicillin resistance NOT DETECTED NOT DETECTED Final   Streptococcus species NOT DETECTED NOT DETECTED Final   Streptococcus agalactiae NOT DETECTED NOT DETECTED Final   Streptococcus pneumoniae NOT DETECTED NOT DETECTED Final   Streptococcus pyogenes NOT DETECTED NOT DETECTED Final   Acinetobacter baumannii NOT DETECTED NOT DETECTED Final   Enterobacteriaceae species DETECTED (A) NOT DETECTED Final    Comment: CRITICAL RESULT CALLED TO, READ BACK BY AND VERIFIED WITHMelven Sartorius [facility] 1610 08/22/18 A BROWNING Enterobacteriaceae represent a large family of gram-negative bacteria, not a single organism.    Enterobacter cloacae complex NOT DETECTED NOT DETECTED Final   Escherichia coli DETECTED (A) NOT DETECTED Final    Comment: CRITICAL RESULT CALLED TO, READ BACK BY AND VERIFIED WITH: Melven Sartorius [facility] 9604 08/22/18 A BROWNING    Klebsiella oxytoca NOT DETECTED NOT DETECTED Final   Klebsiella pneumoniae NOT DETECTED NOT DETECTED Final   Proteus species NOT DETECTED NOT DETECTED Final   Serratia marcescens NOT DETECTED NOT DETECTED Final   Carbapenem resistance NOT DETECTED NOT DETECTED Final   Haemophilus influenzae NOT DETECTED NOT DETECTED Final   Neisseria meningitidis NOT DETECTED NOT DETECTED Final   Pseudomonas aeruginosa NOT DETECTED NOT DETECTED Final   Candida albicans NOT DETECTED NOT DETECTED Final     Candida glabrata NOT DETECTED NOT DETECTED Final   Candida krusei NOT DETECTED NOT DETECTED Final   Candida parapsilosis NOT DETECTED NOT DETECTED Final   Candida tropicalis NOT DETECTED NOT DETECTED Final    Comment: Performed at [facility], [address] [place], [place] [address]  Blood culture (routine x 2)     Status: Abnormal   Collection Time: 08/21/18 11:30 AM  Result Value Ref Range Status   Specimen Description BLOOD LEFT HAND  Final   Special Requests   Final    BOTTLES DRAWN AEROBIC AND ANAEROBIC Blood Culture results may not be optimal due to an inadequate volume of blood received in culture bottles   Culture  Setup Time   Final    GRAM NEGATIVE RODS IN BOTH AEROBIC AND ANAEROBIC BOTTLES CRITICAL RESULT CALLED TO, READ BACK BY AND VERIFIED WITHShela Commons [name] PHARMD 1191 08/22/18 A BROWNING    Culture (A)  Final    ESCHERICHIA COLI SUSCEPTIBILITIES PERFORMED ON PREVIOUS CULTURE WITHIN THE LAST 5 DAYS. Performed at [facility], [address], [place], [place] [address]    Report Status 08/24/2018 FINAL  Final  Urine culture     Status: Abnormal   Collection Time: 08/21/18 12:33 PM  Result Value Ref Range Status   Specimen Description URINE, CATHETERIZED  Final   Special Requests   Final    NONE Performed at [facility], [address] [place], [place] [address]    Culture >=100,000 COLONIES/mL ESCHERICHIA COLI (A)  Final   Report Status 08/23/2018 FINAL  Final   Organism ID, Bacteria ESCHERICHIA COLI (A)  Final      Susceptibility   Escherichia coli - MIC*    AMPICILLIN >=32 RESISTANT Resistant     CEFAZOLIN <=4 SENSITIVE Sensitive     CEFTRIAXONE <=1 SENSITIVE Sensitive     CIPROFLOXACIN 0.5 SENSITIVE Sensitive     GENTAMICIN <=1 SENSITIVE Sensitive     IMIPENEM <=0.25 SENSITIVE Sensitive     NITROFURANTOIN <=16 SENSITIVE Sensitive     TRIMETH/SULFA >=320 RESISTANT Resistant     AMPICILLIN/SULBACTAM >=32 RESISTANT Resistant  PIP/TAZO <=4 SENSITIVE Sensitive     Extended ESBL NEGATIVE Sensitive     * >=100,000 COLONIES/mL ESCHERICHIA COLI  MRSA PCR Screening     Status: Abnormal   Collection Time: 08/21/18  5:53 PM  Result Value Ref Range Status   MRSA by PCR POSITIVE (A) NEGATIVE Final    Comment:        The GeneXpert MRSA Assay (FDA approved for NASAL specimens only), is one component of a comprehensive MRSA colonization surveillance program. It is not intended to diagnose MRSA infection nor to guide or monitor treatment for MRSA infections. RESULT CALLED TO, READ BACK BY AND VERIFIED WITH: Tommie Sams RN 08/21/18 1936 JDW Performed at [facility], [address], [place], [place] [address]   Urine Culture     Status: Abnormal   Collection Time: 08/22/18  9:22 AM  Result Value Ref Range Status   Specimen Description KIDNEY LEFT  Final   Special Requests   Final    NONE Performed at [facility], [address], [place], [place] [address]    Culture >=100,000 COLONIES/mL ESCHERICHIA COLI (A)  Final   Report Status 08/24/2018 FINAL  Final   Organism ID, Bacteria ESCHERICHIA COLI (A)  Final      Susceptibility   Escherichia coli - MIC*    AMPICILLIN >=32 RESISTANT Resistant     CEFAZOLIN 8 SENSITIVE Sensitive     CEFEPIME <=1 SENSITIVE Sensitive     CEFTAZIDIME <=1 SENSITIVE Sensitive     CEFTRIAXONE <=1 SENSITIVE Sensitive     CIPROFLOXACIN 0.5 SENSITIVE Sensitive     GENTAMICIN <=1 SENSITIVE Sensitive     IMIPENEM <=0.25 SENSITIVE Sensitive     TRIMETH/SULFA >=320 RESISTANT Resistant     AMPICILLIN/SULBACTAM >=32 RESISTANT Resistant     PIP/TAZO 16 SENSITIVE Sensitive     Extended ESBL NEGATIVE Sensitive     * >=100,000 COLONIES/mL ESCHERICHIA COLI     Scheduled Meds: . amitriptyline  75 mg Oral QHS  . amphetamine-dextroamphetamine  20 mg Oral Daily  . baclofen  10 mg Oral TID  . chlorhexidine  15 mL Mouth Rinse BID  . famotidine  20 mg Oral Daily  . mouth rinse  15 mL Mouth  Rinse q12n4p  . potassium chloride  40 mEq Oral BID  . sodium chloride flush  5 mL Intracatheter Q12H  . venlafaxine XR  225 mg Oral Daily   Continuous Infusions: . sodium chloride Stopped (08/26/18 0753)  . cefTRIAXone (ROCEPHIN)  IV 2 g (08/28/18 1719)     LOS: 8 days   Zannie Cove, MD [facility] Office  [phone] Pager - Text Page per Amion  If 7PM-7AM, please contact night-coverage per Amion 08/29/2018, 10:06 AM

## 2018-08-29 NOTE — Progress Notes (Signed)
Physical Therapy Treatment Patient Details Name: [name] MRN: [id number] DOB: 1946-01-31 Today's Date: 08/29/2018    History of Present Illness 72 year old admitted 10/14 Found down w/ obstructive uropathy, left hydro, PNA and severe sepsis w/ resultant encephalopathy and AKI. PMH includes but not limited to: Chronic back pain, congestive heart disease,  HTN, HLD, Anxiety, ADD, spinal cord stimulator implant, back surgery.     PT Comments    Pt progressing with mobility today, pivoted to EOB with min A and able to scoot hips fwd. Stood in stedy with max A +2 for power up. Pt maintained sitting in stedy for 10 mins working on upright posture and trunk control. C/o B foot discomfort with the pressure on feet. Transferred to chair with use of stedy for pt to sit up. Discussed back to bed with maxi move with nursing. PT will continue to follow.    Follow Up Recommendations  SNF     Equipment Recommendations  None recommended by PT    Recommendations for Other Services       Precautions / Restrictions Precautions Precautions: Fall Restrictions Weight Bearing Restrictions: No    Mobility  Bed Mobility Overal bed mobility: Needs Assistance Bed Mobility: Supine to Sit Rolling: Min assist         General bed mobility comments: needed specific vc's for sequencing but pt able to pivot with min A for LE's off bed and trunk elevation  Transfers Overall transfer level: Needs assistance Equipment used: Ambulation equipment used Transfers: Sit to/from [employer] Sit to Stand: Max assist;+2 physical assistance Stand pivot transfers: Total assist       General transfer comment: max A for power up and then to wt shift fwd to be able to put flaps down on stedy. Needed mod A to stand back up from stedy to return to sitting in chair  Ambulation/Gait             General Gait Details: unable   Stairs             Wheelchair Mobility    Modified  Rankin (Stroke Patients Only)       Balance Overall balance assessment: Needs assistance Sitting-balance support: No upper extremity supported;Feet unsupported Sitting balance-Leahy Scale: Fair Sitting balance - Comments: sitting EOB with supervision, kyphotic posture   Standing balance support: Bilateral upper extremity supported;During functional activity Standing balance-Leahy Scale: Zero Standing balance comment: requires max A to stand                            Cognition Arousal/Alertness: Awake/alert Behavior During Therapy: WFL for tasks assessed/performed Overall Cognitive Status: History of cognitive impairments - at baseline Area of Impairment: Orientation;Attention;Memory;Following commands;Safety/judgement;Awareness;Problem solving                 Orientation Level: Disoriented to;Time;Situation Current Attention Level: Sustained Memory: Decreased short-term memory;Decreased recall of precautions Following Commands: Follows one step commands with increased time;Follows one step commands consistently Safety/Judgement: Decreased awareness of deficits;Decreased awareness of safety Awareness: Intellectual Problem Solving: Slow processing;Decreased initiation;Difficulty sequencing;Requires verbal cues;Requires tactile cues General Comments: pt oriented to self and is conversant today but still confused, thinking her blanket is from home and that her husband needs to go buy her some shoes.       Exercises      General Comments General comments (skin integrity, edema, etc.): tolerated sitting on stedy with wt through feet for 10 mins.  Pt began to fatigue and lean at that point. C/o B foot discomfort with pressure      Pertinent Vitals/Pain Pain Assessment: Faces Faces Pain Scale: Hurts little more Pain Location: feet in standing Pain Descriptors / Indicators: Discomfort Pain Intervention(s): Limited activity within patient's tolerance;Monitored  during session    Home Living                      Prior Function            PT Goals (current goals can now be found in the care plan section) Acute Rehab PT Goals Patient Stated Goal: go shopping PT Goal Formulation: With patient Time For Goal Achievement: 09/08/18 Potential to Achieve Goals: Fair Progress towards PT goals: Progressing toward goals    Frequency    Min 2X/week      PT Plan Current plan remains appropriate    Co-evaluation              AM-PAC PT "6 Clicks" Daily Activity  Outcome Measure  Difficulty turning over in bed (including adjusting bedclothes, sheets and blankets)?: Unable Difficulty moving from lying on back to sitting on the side of the bed? : Unable Difficulty sitting down on and standing up from a chair with arms (e.g., wheelchair, bedside commode, etc,.)?: Unable Help needed moving to and from a bed to chair (including a wheelchair)?: Total Help needed walking in hospital room?: Total Help needed climbing 3-5 steps with a railing? : Total 6 Click Score: 6    End of Session Equipment Utilized During Treatment: Gait belt Activity Tolerance: Patient limited by fatigue Patient left: with call bell/phone within reach;in chair;with chair alarm set;with family/visitor present Nurse Communication: Mobility status;Need for lift equipment(maxi move for back to bed) PT Visit Diagnosis: Unsteadiness on feet (R26.81);Muscle weakness (generalized) (M62.81);Adult, failure to thrive (R62.7);Pain Pain - part of body: Ankle and joints of foot     Time: 1020-1047 PT Time Calculation (min) (ACUTE ONLY): 27 min  Charges:  $Therapeutic Activity: 23-37 mins                     Lyanne Co, PT  Acute Rehab Services  Pager [phone] Office [phone]    Lawana Chambers Tuck Dulworth 08/29/2018, 12:02 PM

## 2018-08-30 LAB — GLUCOSE, CAPILLARY
GLUCOSE-CAPILLARY: 124 mg/dL — AB (ref 70–99)
Glucose-Capillary: 84 mg/dL (ref 70–99)
Glucose-Capillary: 91 mg/dL (ref 70–99)
Glucose-Capillary: 77 mg/dL (ref 70–99)
Glucose-Capillary: 48 mg/dL — ABNORMAL LOW (ref 70–99)
Glucose-Capillary: 101 mg/dL — ABNORMAL HIGH (ref 70–99)

## 2018-08-30 LAB — BASIC METABOLIC PANEL
ANION GAP: 6 (ref 5–15)
BUN: 33 mg/dL — ABNORMAL HIGH (ref 8–23)
CO2: 23 mmol/L (ref 22–32)
Calcium: 8.7 mg/dL — ABNORMAL LOW (ref 8.9–10.3)
Chloride: 107 mmol/L (ref 98–111)
Creatinine, Ser: 1.01 mg/dL — ABNORMAL HIGH (ref 0.44–1.00)
GFR, EST NON AFRICAN AMERICAN: 55 mL/min — AB (ref >60)
GLUCOSE: 85 mg/dL (ref 70–99)
Potassium: 4.4 mmol/L (ref 3.5–5.1)
Sodium: 136 mmol/L (ref 135–145)

## 2018-08-30 LAB — CBC
HEMATOCRIT: 30.6 % — AB (ref 36.0–46.0)
Hemoglobin: 9.8 g/dL — ABNORMAL LOW (ref 12.0–15.0)
MCH: 29.1 pg (ref 26.0–34.0)
MCHC: 32 g/dL (ref 30.0–36.0)
MCV: 90.8 fL (ref 80.0–100.0)
NRBC: 0 % (ref 0.0–0.2)
PLATELETS: 238 10*3/uL (ref 150–400)
RBC: 3.37 MIL/uL — ABNORMAL LOW (ref 3.87–5.11)
RDW: 13.7 % (ref 11.5–15.5)
WBC: 12.6 10*3/uL — ABNORMAL HIGH (ref 4.0–10.5)

## 2018-08-30 LAB — TSH: TSH: 1.925 u[IU]/mL (ref 0.350–4.500)

## 2018-08-30 LAB — AMMONIA: Ammonia: 20 umol/L (ref 9–35)

## 2018-08-30 LAB — VITAMIN B12: VITAMIN B 12: 6770 pg/mL — AB (ref 180–914)

## 2018-08-30 MED ORDER — DEXTROSE 50 % IV SOLN
INTRAVENOUS | Status: AC
  Administered 2018-08-30: 50 mL
  Filled 2018-08-30: qty 50

## 2018-08-30 MED ORDER — BACLOFEN 5 MG HALF TABLET
5.0000 mg | ORAL_TABLET | Freq: Three times a day (TID) | ORAL | Status: DC
  Administered 2018-08-30 (×2): 5 mg via ORAL
  Filled 2018-08-30: qty 1

## 2018-08-30 MED ORDER — CEFDINIR 300 MG PO CAPS
300.0000 mg | ORAL_CAPSULE | Freq: Two times a day (BID) | ORAL | Status: DC
  Administered 2018-08-30 – 2018-09-01 (×5): 300 mg via ORAL
  Filled 2018-08-30 (×6): qty 1

## 2018-08-30 NOTE — Plan of Care (Addendum)
Discussed plan of care with patient.  Emphasized the importance of using the call bell when assistance is needed.  Also encouraged patient to eat a snack to prevent blood glucose from dropping during the night.  No teach back displayed.

## 2018-08-30 NOTE — Progress Notes (Signed)
[facility] Progress Note  Patient: [name] UJW:[id number]   PCP: Donato Schultz, DO DOB: 03-20-1946   DOA: 08/21/2018   DOS: 08/30/2018   Date of Service: the patient was seen and examined on 08/30/2018  Brief hospital course: 72yo F w/ a hx of chronic back pain on multiple chronic opiates as well as a spinal stimulator, depression/anxiety, migraines, HTN, and ADHD admitted with altered mental status, acute hypoxic respiratory failure, and sepsis. She was found to have a left obstructing UPJ stone and E. coli sepsis. She is now status post PCN tube on 08/22/2018. Her course has been complicated by hypoglycemia of unclear etiology as well as thrombocytopenia. -Initially in ICU, subsequently transferred to stepdown, transferred to [facility]. -Currently further plan is work-up on encephalopathy.  Subjective: Denies any acute complaint.  No nausea no vomiting.  Appears somewhat confused and sleepy.  Assessment and Plan: 1.  Sepsis secondary to E. coli with bacteremia. Comp gated UTI with obstructing UPJ stone. S/P percutaneous nephrostomy tube placement by IR on 1015. Foley catheter placement on 08/21/2018. Urology was consulted recommend outpatient follow-up. 2 weeks of IV antibiotic recommended urology. E. coli resistant to ampicillin and Bactrim. Now tolerating oral very well and therefore we will transition her to oral antibiotic and monitor.  2.  Acute metabolic and potentially toxic encephalopathy. Presented with confusion.  Initially thought to be secondary from sepsis as well as withdrawal from pain medication. I suspect that this is rather a combination of polypharmacy as well as sepsis, CCM felt that the patient also had opiate overdose followed by opiate withdrawal. Patient is currently on baclofen I suspect that this is also contributing to her confusion and therefore we will reduce the dose from 10 to 5 mg 3 times daily. Reportedly about the same.  Will  monitor. Metabolic work-up TSH ammonia and B12 level unremarkable.  3.  Acute hypoxic respiratory failure. Aspiration pneumonia. Treated with IV antibiotics. Oxygenation getting better now on room air.  4.  Acute kidney injury. Left-sided hydronephrosis. S/P percutaneous drain. Outpatient follow-up with urology for definitive treatment of the stone. Renal function significantly better now back to normal.  5.  Chronic back pain. Walks with a cane at her baseline. Still with considerable confusion. Continue PRN oxycodone, reduce baclofen.  6. Anemia normocytic, likely nutritional and dilutional    Component Value Date/Time   HGB 9.8 (L) 08/30/2018 0325   HCT 30.6 (L) 08/30/2018 0325  We will check iron levels and B12 levels are normal.  Folic acid 11.  Monitor.  7. Malnutrition Type: Nutrition Problem: Severe Malnutrition Etiology: chronic illness(suspected malabsorption from bariatric surgery in 2008) Interventions: Interventions: Refer to RD note for recommendations  8.  Pressure ulcer Pressure Injury 08/21/18 Deep Tissue Injury - Purple or maroon localized area of discolored intact skin or blood-filled blister due to damage of underlying soft tissue from pressure and/or shear. Red and Purple area to right lower lateral back and mid sa (Active)  08/21/18 1800   Location: Sacrum  Location Orientation: Mid  Staging: Deep Tissue Injury - Purple or maroon localized area of discolored intact skin or blood-filled blister due to damage of underlying soft tissue from pressure and/or shear.  Wound Description (Comments): Red and Purple area to right lower lateral back and mid sacrum over coccyx   Present on Admission: Yes     Pressure Injury 08/22/18 Deep Tissue Injury - Purple or maroon localized area of discolored intact skin or blood-filled blister due to damage  of underlying soft tissue from pressure and/or shear. red purple half moon shaped contour of heel 4 cm (Active)   08/22/18 1000   Location: Heel  Location Orientation: Right;Left  Staging: Deep Tissue Injury - Purple or maroon localized area of discolored intact skin or blood-filled blister due to damage of underlying soft tissue from pressure and/or shear.  Wound Description (Comments): red purple half moon shaped contour of heel 4 cm  Present on Admission: Yes   9.  Hypokalemia. Replaced. 10.  Thrombocytopenia. Labs are stable. 11.  Essential hypertension. Blood pressure stable.  Monitor.  Diet: Full code DVT Prophylaxis: subcutaneous Heparin  Advance goals of care discussion: full code  Family Communication: no family was present at bedside, at the time of interview.  Disposition:  Discharge to SNF.  Consultants: PCCM Procedures: IR guided PCN placement on left  Scheduled Meds: . amitriptyline  75 mg Oral QHS  . amphetamine-dextroamphetamine  20 mg Oral Daily  . baclofen  5 mg Oral TID  . cefdinir  300 mg Oral Q12H  . chlorhexidine  15 mL Mouth Rinse BID  . enoxaparin (LOVENOX) injection  40 mg Subcutaneous Q24H  . famotidine  20 mg Oral Daily  . mouth rinse  15 mL Mouth Rinse q12n4p  . sodium chloride flush  5 mL Intracatheter Q12H  . venlafaxine XR  225 mg Oral Daily   Continuous Infusions: . sodium chloride Stopped (08/26/18 0753)   PRN Meds: sodium chloride, clonazepam, metoprolol tartrate, oxyCODONE Antibiotics: Anti-infectives (From admission, onward)   Start     Dose/Rate Route Frequency Ordered Stop   08/30/18 1200  cefdinir (OMNICEF) capsule 300 mg     300 mg Oral Every 12 hours 08/30/18 1139 09/05/18 0959   08/28/18 1800  cefTRIAXone (ROCEPHIN) 2 g in sodium chloride 0.9 % 100 mL IVPB  Status:  Discontinued     2 g 200 mL/hr over 30 Minutes Intravenous Every 24 hours 08/28/18 0800 08/30/18 1139   08/23/18 1800  cefTRIAXone (ROCEPHIN) 2 g in sodium chloride 0.9 % 100 mL IVPB  Status:  Discontinued     2 g 200 mL/hr over 30 Minutes Intravenous Every 24 hours  08/23/18 0907 08/28/18 0800   08/22/18 2200  piperacillin-tazobactam (ZOSYN) IVPB 3.375 g  Status:  Discontinued     3.375 g 12.5 mL/hr over 240 Minutes Intravenous Every 8 hours 08/22/18 1402 08/23/18 0906   08/22/18 1130  vancomycin (VANCOCIN) 1,250 mg in sodium chloride 0.9 % 250 mL IVPB     1,250 mg 166.7 mL/hr over 90 Minutes Intravenous  Once 08/22/18 1058 08/22/18 1312   08/22/18 0200  piperacillin-tazobactam (ZOSYN) IVPB 2.25 g  Status:  Discontinued     2.25 g 100 mL/hr over 30 Minutes Intravenous Every 6 hours 08/21/18 1811 08/22/18 1402   08/21/18 2000  piperacillin-tazobactam (ZOSYN) IVPB 3.375 g     3.375 g 100 mL/hr over 30 Minutes Intravenous  Once 08/21/18 1713 08/21/18 2148   08/21/18 1215  cefTRIAXone (ROCEPHIN) 1 g in sodium chloride 0.9 % 100 mL IVPB     1 g 200 mL/hr over 30 Minutes Intravenous  Once 08/21/18 1202 08/21/18 1310   08/21/18 1215  azithromycin (ZITHROMAX) 500 mg in sodium chloride 0.9 % 250 mL IVPB     500 mg 250 mL/hr over 60 Minutes Intravenous  Once 08/21/18 1202 08/21/18 1416       Objective: Physical Exam: Vitals:   08/29/18 2233 08/30/18 0500 08/30/18 0720 08/30/18 1729  BP: 126/74  139/71 134/66  Pulse: 90  78 90  Resp: 20     Temp: 98.8 F (37.1 C)  98.2 F (36.8 C) 97.8 F (36.6 C)  TempSrc: Oral  Oral Oral  SpO2: 95%  98% 99%  Weight:  65 kg    Height:        Intake/Output Summary (Last 24 hours) at 08/30/2018 1730 Last data filed at 08/30/2018 0920 Gross per 24 hour  Intake 103 ml  Output 1125 ml  Net -1022 ml   Filed Weights   08/28/18 0300 08/29/18 0213 08/30/18 0500  Weight: 66.8 kg 68.3 kg 65 kg   General: Alert, Awake and Oriented to Place and Person. Appear in mild distress, affect appropriate Eyes: PERRL, Conjunctiva normal ENT: Oral Mucosa clear moist Neck: no JVD, no Abnormal Mass Or lumps Cardiovascular: S1 and S2 Present, no Murmur, Peripheral Pulses Present Respiratory: normal respiratory effort,  Bilateral Air entry equal and Decreased, no use of accessory muscle, Clear to Auscultation, no Crackles, no wheezes Abdomen: Bowel Sound [resent, Soft and no tenderness, no hernia Skin: no redness, no Rash, no induration Extremities: no Pedal edema, no calf tenderness Neurologic: Grossly no focal neuro deficit. Bilaterally Equal motor strength  Data Reviewed: CBC: Recent Labs  Lab 08/26/18 0546 08/26/18 0839 08/27/18 0206 08/28/18 0244 08/29/18 0139 08/30/18 0325  WBC 15.4*  --  13.0* 12.7* 11.2* 12.6*  NEUTROABS  --   --  10.7*  --   --   --   HGB 11.7*  --  10.0* 10.3* 9.4* 9.8*  HCT 35.7*  --  29.8* 30.8* 28.6* 30.6*  MCV 87.7  --  86.4 86.0 88.5 90.8  PLT 22* 26* 36* 84* 121* 238   Basic Metabolic Panel: Recent Labs  Lab 08/24/18 0252 08/25/18 0451  08/26/18 0314 08/27/18 0206 08/27/18 1433 08/28/18 0244 08/29/18 0139 08/30/18 0325  NA 139 135   < > 134* 133* 133* 134* 135 136  K 3.4* 2.0*   < > 3.7 2.3* 2.9* 3.1* 4.4 4.4  CL 113* 107   < > 104 101 103 104 108 107  CO2 17* 19*   < > 17* 23 22 22 22 23   GLUCOSE 108* 112*   < > 97 101* 92 87 82 85  BUN 48* 51*   < > 45* 41* 39* 38* 38* 33*  CREATININE 1.98* 1.84*   < > 1.50* 1.38* 1.26* 1.23* 1.10* 1.01*  CALCIUM 8.2* 8.0*   < > 7.8* 7.8* 8.1* 8.1* 8.1* 8.7*  MG 1.9 1.5*  --  2.1 1.8  --   --  1.9  --   PHOS 2.6 3.8  --  3.2  --   --   --   --   --    < > = values in this interval not displayed.    Liver Function Tests: Recent Labs  Lab 08/27/18 0206 08/29/18 0139  AST 29 68*  ALT 11 22  ALKPHOS 83 110  BILITOT 1.0 0.6  PROT 4.1* 4.7*  ALBUMIN 1.5* 1.7*   No results for input(s): LIPASE, AMYLASE in the last 168 hours. Recent Labs  Lab 08/30/18 0934  AMMONIA 20   Coagulation Profile: Recent Labs  Lab 08/26/18 0839  INR 2.80   Cardiac Enzymes: No results for input(s): CKTOTAL, CKMB, CKMBINDEX, TROPONINI in the last 168 hours. BNP (last 3 results) No results for input(s): PROBNP in the last  8760 hours. CBG: Recent Labs  Lab 08/29/18 2003 08/29/18 2355 08/30/18  0403 08/30/18 0720 08/30/18 1124  GLUCAP 82 96 84 91 77   Studies: No results found.   Time spent: 35 minutes  Author: Lynden Oxford, MD [facility] Pager: [phone] 08/30/2018 5:30 PM  Between 7PM-7AM, please contact night-coverage at [email or web address], password [facility]

## 2018-08-30 NOTE — Progress Notes (Signed)
Following this patient for a left-sided proximal ureteral stone with associated urosepsis.  She is status post left nephrostomy tube placement.  She is been in the hospital now for over a week.  The patient has made significant progress in recovering from her E. coli sepsis.  She is now on Omnicef and will be on antibiotics for a total of 2 weeks.  She appears to be close to discharge, to a skilled nursing facility.  She is without any significant complaints today.  Specifically she denies any associated left-sided flank pain.  She denies any nausea or vomiting.  She has been afebrile with stable vital signs.  NAD Vitals:   08/29/18 1633 08/29/18 2233 08/30/18 0500 08/30/18 0720  BP: 118/61 126/74  139/71  Pulse: 88 90  78  Resp: (!) 21 20    Temp: 98.2 F (36.8 C) 98.8 F (37.1 C)  98.2 F (36.8 C)  TempSrc: Oral Oral  Oral  SpO2: 100% 95%  98%  Weight:   65 kg   Height:      I/O last 3 completed shifts: In: 900 [P.O.:700; IV Piggyback:200] Out: 1925 [Urine:1925]  Abdomen is soft Nephrostomy tube is draining clear yellow urine.  Recent Labs    08/28/18 0244 08/29/18 0139 08/30/18 0325  WBC 12.7* 11.2* 12.6*  HGB 10.3* 9.4* 9.8*  HCT 30.8* 28.6* 30.6*   Recent Labs    08/28/18 0244 08/29/18 0139 08/30/18 0325  NA 134* 135 136  K 3.1* 4.4 4.4  CL 104 108 107  CO2 22 22 23   GLUCOSE 87 82 85  BUN 38* 38* 33*  CREATININE 1.23* 1.10* 1.01*  CALCIUM 8.1* 8.1* 8.7*   No results for input(s): LABPT, INR in the last 72 hours. No results for input(s): PSA in the last 72 hours. No results for input(s): LABURIN in the last 72 hours. Results for orders placed or performed during the hospital encounter of 08/21/18  Blood culture (routine x 2)     Status: Abnormal   Collection Time: 08/21/18 11:02 AM  Result Value Ref Range Status   Specimen Description BLOOD RIGHT HAND  Final   Special Requests   Final    BOTTLES DRAWN AEROBIC AND ANAEROBIC Blood Culture results may not be  optimal due to an inadequate volume of blood received in culture bottles   Culture  Setup Time   Final    GRAM NEGATIVE RODS IN BOTH AEROBIC AND ANAEROBIC BOTTLES Organism ID to follow CRITICAL RESULT CALLED TO, READ BACK BY AND VERIFIED WITHMelven Sartorius [facility] 4782 08/22/18 A BROWNING Performed at [facility], [address], [place], [place] [address]    Culture ESCHERICHIA COLI (A)  Final   Report Status 08/24/2018 FINAL  Final   Organism ID, Bacteria ESCHERICHIA COLI  Final      Susceptibility   Escherichia coli - MIC*    AMPICILLIN >=32 RESISTANT Resistant     CEFAZOLIN <=4 SENSITIVE Sensitive     CEFEPIME <=1 SENSITIVE Sensitive     CEFTAZIDIME <=1 SENSITIVE Sensitive     CEFTRIAXONE <=1 SENSITIVE Sensitive     CIPROFLOXACIN 0.5 SENSITIVE Sensitive     GENTAMICIN <=1 SENSITIVE Sensitive     IMIPENEM <=0.25 SENSITIVE Sensitive     TRIMETH/SULFA >=320 RESISTANT Resistant     AMPICILLIN/SULBACTAM >=32 RESISTANT Resistant     PIP/TAZO <=4 SENSITIVE Sensitive     Extended ESBL NEGATIVE Sensitive     * ESCHERICHIA COLI  Blood Culture ID Panel (  Reflexed)     Status: Abnormal   Collection Time: 08/21/18 11:02 AM  Result Value Ref Range Status   Enterococcus species NOT DETECTED NOT DETECTED Final   Vancomycin resistance NOT DETECTED NOT DETECTED Final   Listeria monocytogenes NOT DETECTED NOT DETECTED Final   Staphylococcus species NOT DETECTED NOT DETECTED Final   Staphylococcus aureus (BCID) NOT DETECTED NOT DETECTED Final   Methicillin resistance NOT DETECTED NOT DETECTED Final   Streptococcus species NOT DETECTED NOT DETECTED Final   Streptococcus agalactiae NOT DETECTED NOT DETECTED Final   Streptococcus pneumoniae NOT DETECTED NOT DETECTED Final   Streptococcus pyogenes NOT DETECTED NOT DETECTED Final   Acinetobacter baumannii NOT DETECTED NOT DETECTED Final   Enterobacteriaceae species DETECTED (A) NOT DETECTED Final    Comment: CRITICAL RESULT CALLED TO, READ BACK  BY AND VERIFIED WITHMelven Sartorius PHARMD 1610 08/22/18 A BROWNING Enterobacteriaceae represent a large family of gram-negative bacteria, not a single organism.    Enterobacter cloacae complex NOT DETECTED NOT DETECTED Final   Escherichia coli DETECTED (A) NOT DETECTED Final    Comment: CRITICAL RESULT CALLED TO, READ BACK BY AND VERIFIED WITH: Melven Sartorius [facility] 9604 08/22/18 A BROWNING    Klebsiella oxytoca NOT DETECTED NOT DETECTED Final   Klebsiella pneumoniae NOT DETECTED NOT DETECTED Final   Proteus species NOT DETECTED NOT DETECTED Final   Serratia marcescens NOT DETECTED NOT DETECTED Final   Carbapenem resistance NOT DETECTED NOT DETECTED Final   Haemophilus influenzae NOT DETECTED NOT DETECTED Final   Neisseria meningitidis NOT DETECTED NOT DETECTED Final   Pseudomonas aeruginosa NOT DETECTED NOT DETECTED Final   Candida albicans NOT DETECTED NOT DETECTED Final   Candida glabrata NOT DETECTED NOT DETECTED Final   Candida krusei NOT DETECTED NOT DETECTED Final   Candida parapsilosis NOT DETECTED NOT DETECTED Final   Candida tropicalis NOT DETECTED NOT DETECTED Final    Comment: Performed at [facility], [address] [place], [place] [address]  Blood culture (routine x 2)     Status: Abnormal   Collection Time: 08/21/18 11:30 AM  Result Value Ref Range Status   Specimen Description BLOOD LEFT HAND  Final   Special Requests   Final    BOTTLES DRAWN AEROBIC AND ANAEROBIC Blood Culture results may not be optimal due to an inadequate volume of blood received in culture bottles   Culture  Setup Time   Final    GRAM NEGATIVE RODS IN BOTH AEROBIC AND ANAEROBIC BOTTLES CRITICAL RESULT CALLED TO, READ BACK BY AND VERIFIED WITHShela Commons [facility] PHARMD 1191 08/22/18 A BROWNING    Culture (A)  Final    ESCHERICHIA COLI SUSCEPTIBILITIES PERFORMED ON PREVIOUS CULTURE WITHIN THE LAST 5 DAYS. Performed at [facility], [address], [place], [place] [address]    Report Status  08/24/2018 FINAL  Final  Urine culture     Status: Abnormal   Collection Time: 08/21/18 12:33 PM  Result Value Ref Range Status   Specimen Description URINE, CATHETERIZED  Final   Special Requests   Final    NONE Performed at [facility], [address] [place], [place] [address]    Culture >=100,000 COLONIES/mL ESCHERICHIA COLI (A)  Final   Report Status 08/23/2018 FINAL  Final   Organism ID, Bacteria ESCHERICHIA COLI (A)  Final      Susceptibility   Escherichia coli - MIC*    AMPICILLIN >=32 RESISTANT Resistant     CEFAZOLIN <=4 SENSITIVE Sensitive  CEFTRIAXONE <=1 SENSITIVE Sensitive     CIPROFLOXACIN 0.5 SENSITIVE Sensitive     GENTAMICIN <=1 SENSITIVE Sensitive     IMIPENEM <=0.25 SENSITIVE Sensitive     NITROFURANTOIN <=16 SENSITIVE Sensitive     TRIMETH/SULFA >=320 RESISTANT Resistant     AMPICILLIN/SULBACTAM >=32 RESISTANT Resistant     PIP/TAZO <=4 SENSITIVE Sensitive     Extended ESBL NEGATIVE Sensitive     * >=100,000 COLONIES/mL ESCHERICHIA COLI  MRSA PCR Screening     Status: Abnormal   Collection Time: 08/21/18  5:53 PM  Result Value Ref Range Status   MRSA by PCR POSITIVE (A) NEGATIVE Final    Comment:        The GeneXpert MRSA Assay (FDA approved for NASAL specimens only), is one component of a comprehensive MRSA colonization surveillance program. It is not intended to diagnose MRSA infection nor to guide or monitor treatment for MRSA infections. RESULT CALLED TO, READ BACK BY AND VERIFIED WITH: Tommie Sams RN 08/21/18 1936 JDW Performed at [facility], [address], [place], [place] [address]   Urine Culture     Status: Abnormal   Collection Time: 08/22/18  9:22 AM  Result Value Ref Range Status   Specimen Description KIDNEY LEFT  Final   Special Requests   Final    NONE Performed at [facility], [address] [place], [place] [address]    Culture >=100,000 COLONIES/mL ESCHERICHIA COLI (A)  Final   Report Status 08/24/2018  FINAL  Final   Organism ID, Bacteria ESCHERICHIA COLI (A)  Final      Susceptibility   Escherichia coli - MIC*    AMPICILLIN >=32 RESISTANT Resistant     CEFAZOLIN 8 SENSITIVE Sensitive     CEFEPIME <=1 SENSITIVE Sensitive     CEFTAZIDIME <=1 SENSITIVE Sensitive     CEFTRIAXONE <=1 SENSITIVE Sensitive     CIPROFLOXACIN 0.5 SENSITIVE Sensitive     GENTAMICIN <=1 SENSITIVE Sensitive     IMIPENEM <=0.25 SENSITIVE Sensitive     TRIMETH/SULFA >=320 RESISTANT Resistant     AMPICILLIN/SULBACTAM >=32 RESISTANT Resistant     PIP/TAZO 16 SENSITIVE Sensitive     Extended ESBL NEGATIVE Sensitive     * >=100,000 COLONIES/mL ESCHERICHIA COLI    Imp: The patient is now recovering from E. coli urosepsis secondary to an obstructing proximal left ureteral stone.  Nephrostomy tube was placed and is draining clear yellow urine at this time.  She is asymptomatic for the most part in this regard.  She is on Omnicef and scheduled to get a total of 2 weeks of antibiotics.  Plan: I spoke with the patient about the process of her getting sick from the bacteria, the kidney stone, the temporizing measure of the nephrostomy tube, and the definitive treatment of ureteroscopy, laser lithotripsy and stone removal.  However, I am not sure how much the patient fully grasp.  She did seem to me to be somewhat confused, with some tangential thoughts and inappropriate questions.  Nonetheless, she does understand that she needs to follow-up with me in clinic so we can make more definitive plans for stone removal.  I will try to get this scheduled for her so that it is convenient.  We will try to get this done over the next 2 weeks.  I will sign off of the patient at this time.

## 2018-08-31 LAB — CBC WITH DIFFERENTIAL/PLATELET
ABS IMMATURE GRANULOCYTES: 0.08 10*3/uL — AB (ref 0.00–0.07)
BASOS ABS: 0.1 10*3/uL (ref 0.0–0.1)
BASOS PCT: 1 %
EOS ABS: 0.1 10*3/uL (ref 0.0–0.5)
Eosinophils Relative: 1 %
HCT: 29.8 % — ABNORMAL LOW (ref 36.0–46.0)
Hemoglobin: 9.6 g/dL — ABNORMAL LOW (ref 12.0–15.0)
Immature Granulocytes: 1 %
Lymphocytes Relative: 16 %
Lymphs Abs: 1.4 10*3/uL (ref 0.7–4.0)
MCH: 29.3 pg (ref 26.0–34.0)
MCHC: 32.2 g/dL (ref 30.0–36.0)
MCV: 90.9 fL (ref 80.0–100.0)
MONOS PCT: 10 %
Monocytes Absolute: 0.9 10*3/uL (ref 0.1–1.0)
NEUTROS PCT: 71 %
NRBC: 0 % (ref 0.0–0.2)
Neutro Abs: 6.2 10*3/uL (ref 1.7–7.7)
PLATELETS: 328 10*3/uL (ref 150–400)
RBC: 3.28 MIL/uL — AB (ref 3.87–5.11)
RDW: 13.4 % (ref 11.5–15.5)
WBC: 8.7 10*3/uL (ref 4.0–10.5)

## 2018-08-31 LAB — COMPREHENSIVE METABOLIC PANEL
ALBUMIN: 2 g/dL — AB (ref 3.5–5.0)
ALT: 23 U/L (ref 0–44)
ANION GAP: 5 (ref 5–15)
AST: 57 U/L — ABNORMAL HIGH (ref 15–41)
Alkaline Phosphatase: 106 U/L (ref 38–126)
BUN: 27 mg/dL — ABNORMAL HIGH (ref 8–23)
CHLORIDE: 104 mmol/L (ref 98–111)
CO2: 25 mmol/L (ref 22–32)
Calcium: 8.6 mg/dL — ABNORMAL LOW (ref 8.9–10.3)
Creatinine, Ser: 0.94 mg/dL (ref 0.44–1.00)
GFR calc non Af Amer: >60 mL/min (ref >60)
Glucose, Bld: 90 mg/dL (ref 70–99)
Potassium: 4.2 mmol/L (ref 3.5–5.1)
Sodium: 134 mmol/L — ABNORMAL LOW (ref 135–145)
Total Bilirubin: 0.7 mg/dL (ref 0.3–1.2)
Total Protein: 5.5 g/dL — ABNORMAL LOW (ref 6.5–8.1)

## 2018-08-31 LAB — GLUCOSE, CAPILLARY
GLUCOSE-CAPILLARY: 64 mg/dL — AB (ref 70–99)
GLUCOSE-CAPILLARY: 76 mg/dL (ref 70–99)
GLUCOSE-CAPILLARY: 58 mg/dL — AB (ref 70–99)
GLUCOSE-CAPILLARY: 73 mg/dL (ref 70–99)
Glucose-Capillary: 164 mg/dL — ABNORMAL HIGH (ref 70–99)
Glucose-Capillary: 27 mg/dL — CL (ref 70–99)
Glucose-Capillary: 46 mg/dL — ABNORMAL LOW (ref 70–99)
Glucose-Capillary: 30 mg/dL — CL (ref 70–99)
Glucose-Capillary: 120 mg/dL — ABNORMAL HIGH (ref 70–99)

## 2018-08-31 LAB — MAGNESIUM: Magnesium: 2 mg/dL (ref 1.7–2.4)

## 2018-08-31 LAB — GLUCOSE, RANDOM: Glucose, Bld: 91 mg/dL (ref 70–99)

## 2018-08-31 MED ORDER — AMITRIPTYLINE HCL 50 MG PO TABS: 50.0000 mg | ORAL_TABLET | Freq: Every day | ORAL | Status: DC

## 2018-08-31 MED ORDER — DEXTROSE 50 % IV SOLN
INTRAVENOUS | Status: AC
  Administered 2018-08-31: 50 mL
  Filled 2018-08-31: qty 50

## 2018-08-31 MED ORDER — ENSURE ENLIVE PO LIQD
237.0000 mL | Freq: Three times a day (TID) | ORAL | Status: DC
  Administered 2018-08-31: 237 mL via ORAL

## 2018-08-31 MED ORDER — DEXTROSE 10 % IV SOLN
INTRAVENOUS | Status: DC
  Administered 2018-08-31: 18:00:00 via INTRAVENOUS

## 2018-08-31 NOTE — Progress Notes (Signed)
Occupational Therapy Treatment Patient Details Name: [name] MRN: [id number] DOB: 10/12/1946 Today's Date: 08/31/2018    History of present illness 72 year old admitted 10/14 Found down w/ obstructive uropathy, left hydro, PNA and severe sepsis w/ resultant encephalopathy and AKI. PMH includes but not limited to: Chronic back pain, congestive heart disease,  HTN, HLD, Anxiety, ADD, spinal cord stimulator implant, back surgery.    OT comments  Pt seen for OT treatment session focusing on grooming in unsupported sitting and pt met goal of set up/S level. Pt will continue to benefit from acute OT with follow up OT at SNF to further increase independence.  Follow Up Recommendations  SNF;Supervision/Assistance - 24 hour    Equipment Recommendations  Other (comment)(TBD at next venue)       Precautions / Restrictions Precautions Precautions: Fall Restrictions Weight Bearing Restrictions: No       Mobility Bed Mobility               General bed mobility comments: Pt up in recliner upon arrival  Transfers Overall transfer level: Needs assistance               General transfer comment: Pt able to scoot self foreward to front of recliner for grooming tasks with S, Mod A to scoot posteriorly post grooming (alternating sides)    Balance Overall balance assessment: Needs assistance Sitting-balance support: No upper extremity supported;Feet supported Sitting balance-Leahy Scale: Good Sitting balance - Comments: sitting front of recliner for grooming                                   ADL either performed or assessed with clinical judgement   ADL Overall ADL's : Needs assistance/impaired     Grooming: Wash/dry face;Oral care;Brushing hair;Set up;Supervision/safety Grooming Details (indicate cue type and reason): sitting at front of recliner                                     Vision Patient Visual Report: No change from baseline             Cognition Arousal/Alertness: Awake/alert Behavior During Therapy: WFL for tasks assessed/performed Overall Cognitive Status: History of cognitive impairments - at baseline                                                     Pertinent Vitals/ Pain       Pain Assessment: 0-10 Pain Score: 4  Pain Location: back, legs Pain Descriptors / Indicators: Aching;Sore Pain Intervention(s): Monitored during session;Repositioned;Patient requesting pain meds-RN notified         Frequency  Min 2X/week        Progress Toward Goals  OT Goals(current goals can now be found in the care plan section)  Progress towards OT goals: Progressing toward goals     Plan Discharge plan remains appropriate       AM-PAC PT "6 Clicks" Daily Activity     Outcome Measure   Help from another person eating meals?: A Little Help from another person taking care of personal grooming?: A Little Help from another person toileting, which includes using toliet, bedpan, or urinal?: Total Help from another  person bathing (including washing, rinsing, drying)?: A Little Help from another person to put on and taking off regular upper body clothing?: A Lot Help from another person to put on and taking off regular lower body clothing?: Total 6 Click Score: 13    End of Session    OT Visit Diagnosis: Other abnormalities of gait and mobility (R26.89);Muscle weakness (generalized) (M62.81);Other symptoms and signs involving cognitive function   Activity Tolerance Patient tolerated treatment well   Patient Left in chair;with call bell/phone within reach(no alarm in chair from patient getting up earlier with floor staff)           Time: 9276-3943 OT Time Calculation (min): 22 min  Charges: OT General Charges $OT Visit: 1 Visit OT Treatments $Self Care/Home Management : 8-22 mins [address], OTR/L Acute [employer] Pager [phone] Office  [phone]      Almon Register 08/31/2018, 3:34 PM

## 2018-08-31 NOTE — Progress Notes (Signed)
[facility] Progress Note  Patient: [name] ZOX:[id number]   PCP: Donato Schultz, DO DOB: 1946-01-21   DOA: 08/21/2018   DOS: 08/31/2018   Date of Service: the patient was seen and examined on 08/31/2018  Brief hospital course: 72yo F w/ a hx of chronic back pain on multiple chronic opiates as well as a spinal stimulator, depression/anxiety, migraines, HTN, and ADHD admitted with altered mental status, acute hypoxic respiratory failure, and sepsis. She was found to have a left obstructing UPJ stone and E. coli sepsis. She is now status post PCN tube on 08/22/2018. Her course has been complicated by hypoglycemia of unclear etiology as well as thrombocytopenia. -Initially in ICU,subsequently transferred to stepdown, transferred to [facility]. -Currently further plan is work-up on encephalopathy.  Subjective: No acute complaint.  Patient hallucinating visually.  No nausea or vomiting.  No chest pain.  No abdominal pain.  Assessment and Plan: 1.  Sepsis secondary to E. coli with bacteremia. Comp gated UTI with obstructing UPJ stone. S/P percutaneous nephrostomy tube placement by IR on 1015. Foley catheter placement on 08/21/2018. Urology was consulted recommend outpatient follow-up. 2 weeks of IV antibiotic recommended urology. E. coli resistant to ampicillin and Bactrim. Now tolerating oral very well and therefore we will transition her to oral antibiotic and monitor.  2.  Acute metabolic and potentially toxic encephalopathy. Presented with confusion.  Initially thought to be secondary from sepsis as well as withdrawal from pain medication. I suspect that this is rather a combination of polypharmacy as well as sepsis, CCM felt that the patient also had opiate overdose followed by opiate withdrawal. Patient is currently on baclofen, Norco, amitriptyline, venlafaxine.  I suspect polypharmacy is playing a role here and will discontinue all the medications. Reportedly about the  same.  Will monitor. Metabolic work-up TSH ammonia and B12 level unremarkable.  3.  Acute hypoxic respiratory failure. Aspiration pneumonia. Treated with IV antibiotics. Oxygenation getting better now on room air.  4.  Acute kidney injury. Left-sided hydronephrosis. S/P percutaneous drain. Outpatient follow-up with urology for definitive treatment of the stone. Renal function significantly better now back to normal. Foley catheter removed  5.  Chronic back pain. Walks with a cane at her baseline. Still with considerable confusion. Hold PRN oxycodone, baclofen.  6. Anemia normocytic, likely nutritional and dilutional Hb stable  Recently checked iron levels and B12 levels are normal.  Folic acid 11. Monitor.  7. Severe Malnutrition Hypoglycemia Etiology: chronic illness(suspected malabsorption from bariatric surgery in 2008) Interventions: Interventions: Refer to RD note for recommendations Patient has adequate oral intake but remains hypoglycemic for some reason.  We will start her on D10 infusion. With the next episode of hypoglycemia we should check a BMP to correlate her sugar levels with POC G.  8.  Hypokalemia. Replaced.  9.  Thrombocytopenia. Labs are stable.  11.  Essential hypertension. Blood pressure stable.  Monitor.  12.  Raynaud's phenomenon. Etiology not clear.  Continue supportive measures  Pressure Injury 08/21/18 Deep Tissue Injury - Purple or maroon localized area of discolored intact skin or blood-filled blister due to damage of underlying soft tissue from pressure and/or shear. Red and Purple area to right lower lateral back and mid sa (Active)  08/21/18 1800   Location: Sacrum  Location Orientation: Mid  Staging: Deep Tissue Injury - Purple or maroon localized area of discolored intact skin or blood-filled blister due to damage of underlying soft tissue from pressure and/or shear.  Wound Description (Comments): Red and  Purple area to right  lower lateral back and mid sacrum over coccyx   Present on Admission: Yes     Pressure Injury 08/22/18 Deep Tissue Injury - Purple or maroon localized area of discolored intact skin or blood-filled blister due to damage of underlying soft tissue from pressure and/or shear. red purple half moon shaped contour of heel 4 cm (Active)  08/22/18 1000   Location: Heel  Location Orientation: Right;Left  Staging: Deep Tissue Injury - Purple or maroon localized area of discolored intact skin or blood-filled blister due to damage of underlying soft tissue from pressure and/or shear.  Wound Description (Comments): red purple half moon shaped contour of heel 4 cm  Present on Admission: Yes    Diet: dysphagia 2 diet DVT Prophylaxis: subcutaneous Heparin  Advance goals of care discussion: full code  Family Communication: no family was present at bedside, at the time of interview.   Disposition:  Discharge to SNF.  Consultants: PCCM  Procedures: IR guided PCN placement on left  Scheduled Meds: . amitriptyline  50 mg Oral QHS  . amphetamine-dextroamphetamine  20 mg Oral Daily  . cefdinir  300 mg Oral Q12H  . chlorhexidine  15 mL Mouth Rinse BID  . enoxaparin (LOVENOX) injection  40 mg Subcutaneous Q24H  . famotidine  20 mg Oral Daily  . mouth rinse  15 mL Mouth Rinse q12n4p  . sodium chloride flush  5 mL Intracatheter Q12H   Continuous Infusions: . sodium chloride Stopped (08/26/18 0753)   PRN Meds: sodium chloride, metoprolol tartrate Antibiotics: Anti-infectives (From admission, onward)   Start     Dose/Rate Route Frequency Ordered Stop   08/30/18 1200  cefdinir (OMNICEF) capsule 300 mg     300 mg Oral Every 12 hours 08/30/18 1139 09/05/18 0959   08/28/18 1800  cefTRIAXone (ROCEPHIN) 2 g in sodium chloride 0.9 % 100 mL IVPB  Status:  Discontinued     2 g 200 mL/hr over 30 Minutes Intravenous Every 24 hours 08/28/18 0800 08/30/18 1139   08/23/18 1800  cefTRIAXone (ROCEPHIN) 2 g in  sodium chloride 0.9 % 100 mL IVPB  Status:  Discontinued     2 g 200 mL/hr over 30 Minutes Intravenous Every 24 hours 08/23/18 0907 08/28/18 0800   08/22/18 2200  piperacillin-tazobactam (ZOSYN) IVPB 3.375 g  Status:  Discontinued     3.375 g 12.5 mL/hr over 240 Minutes Intravenous Every 8 hours 08/22/18 1402 08/23/18 0906   08/22/18 1130  vancomycin (VANCOCIN) 1,250 mg in sodium chloride 0.9 % 250 mL IVPB     1,250 mg 166.7 mL/hr over 90 Minutes Intravenous  Once 08/22/18 1058 08/22/18 1312   08/22/18 0200  piperacillin-tazobactam (ZOSYN) IVPB 2.25 g  Status:  Discontinued     2.25 g 100 mL/hr over 30 Minutes Intravenous Every 6 hours 08/21/18 1811 08/22/18 1402   08/21/18 2000  piperacillin-tazobactam (ZOSYN) IVPB 3.375 g     3.375 g 100 mL/hr over 30 Minutes Intravenous  Once 08/21/18 1713 08/21/18 2148   08/21/18 1215  cefTRIAXone (ROCEPHIN) 1 g in sodium chloride 0.9 % 100 mL IVPB     1 g 200 mL/hr over 30 Minutes Intravenous  Once 08/21/18 1202 08/21/18 1310   08/21/18 1215  azithromycin (ZITHROMAX) 500 mg in sodium chloride 0.9 % 250 mL IVPB     500 mg 250 mL/hr over 60 Minutes Intravenous  Once 08/21/18 1202 08/21/18 1416       Objective: Physical Exam: Vitals:   08/30/18 0500  08/30/18 0720 08/30/18 1729 08/31/18 0123  BP:  139/71 134/66 107/70  Pulse:  78 90 86  Resp:    20  Temp:  98.2 F (36.8 C) 97.8 F (36.6 C)   TempSrc:  Oral Oral   SpO2:  98% 99% (!) 88%  Weight: 65 kg     Height:        Intake/Output Summary (Last 24 hours) at 08/31/2018 0738 Last data filed at 08/31/2018 0148 Gross per 24 hour  Intake 3 ml  Output 800 ml  Net -797 ml   Filed Weights   08/28/18 0300 08/29/18 0213 08/30/18 0500  Weight: 66.8 kg 68.3 kg 65 kg   General: Alert, Awake and Oriented to Time, Place and Person. Appear in mild distress, affect appropriate Eyes: PERRL, Conjunctiva normal ENT: Oral Mucosa clear moist. Neck: no JVD, no Abnormal Mass Or lumps Cardiovascular:  S1 and S2 Present, no Murmur, Peripheral Pulses Present Respiratory: normal respiratory effort, Bilateral Air entry equal and Decreased, no use of accessory muscle, Clear to Auscultation, no Crackles, no wheezes Abdomen: Bowel Sound present, Soft and no tenderness, no hernia Skin: no redness, no Rash, no induration, cyanotic finger tips,  Extremities: no Pedal edema, no calf tenderness Neurologic: Grossly no focal neuro deficit. Bilaterally Equal motor strength  Data Reviewed: CBC: Recent Labs  Lab 08/27/18 0206 08/28/18 0244 08/29/18 0139 08/30/18 0325 08/31/18 0320  WBC 13.0* 12.7* 11.2* 12.6* 8.7  NEUTROABS 10.7*  --   --   --  6.2  HGB 10.0* 10.3* 9.4* 9.8* 9.6*  HCT 29.8* 30.8* 28.6* 30.6* 29.8*  MCV 86.4 86.0 88.5 90.8 90.9  PLT 36* 84* 121* 238 328   Basic Metabolic Panel: Recent Labs  Lab 08/25/18 0451  08/26/18 0314 08/27/18 0206 08/27/18 1433 08/28/18 0244 08/29/18 0139 08/30/18 0325 08/31/18 0320  NA 135   < > 134* 133* 133* 134* 135 136 134*  K 2.0*   < > 3.7 2.3* 2.9* 3.1* 4.4 4.4 4.2  CL 107   < > 104 101 103 104 108 107 104  CO2 19*   < > 17* 23 22 22 22 23 25   GLUCOSE 112*   < > 97 101* 92 87 82 85 90  BUN 51*   < > 45* 41* 39* 38* 38* 33* 27*  CREATININE 1.84*   < > 1.50* 1.38* 1.26* 1.23* 1.10* 1.01* 0.94  CALCIUM 8.0*   < > 7.8* 7.8* 8.1* 8.1* 8.1* 8.7* 8.6*  MG 1.5*  --  2.1 1.8  --   --  1.9  --  2.0  PHOS 3.8  --  3.2  --   --   --   --   --   --    < > = values in this interval not displayed.    Liver Function Tests: Recent Labs  Lab 08/27/18 0206 08/29/18 0139 08/31/18 0320  AST 29 68* 57*  ALT 11 22 23   ALKPHOS 83 110 106  BILITOT 1.0 0.6 0.7  PROT 4.1* 4.7* 5.5*  ALBUMIN 1.5* 1.7* 2.0*   No results for input(s): LIPASE, AMYLASE in the last 168 hours. Recent Labs  Lab 08/30/18 0934  AMMONIA 20   Coagulation Profile: Recent Labs  Lab 08/26/18 0839  INR 2.80   Cardiac Enzymes: No results for input(s): CKTOTAL, CKMB,  CKMBINDEX, TROPONINI in the last 168 hours. BNP (last 3 results) No results for input(s): PROBNP in the last 8760 hours. CBG: Recent Labs  Lab 08/30/18 1727  08/30/18 1920 08/30/18 2103 08/31/18 0118 08/31/18 0358  GLUCAP 124* 48* 101* 64* 76   Studies: No results found.   Time spent: 35 minutes  Author: Lynden Oxford, MD [facility] Pager: [phone] 08/31/2018 7:38 AM  Between 7PM-7AM, please contact night-coverage at [email or web address], password [facility]

## 2018-08-31 NOTE — Plan of Care (Signed)
Patient is very confused.  Difficult to discuss plan of care for the evening.  Call bell is within reach.  No teach back displayed.

## 2018-09-01 DIAGNOSIS — A4151 Sepsis due to Escherichia coli [E. coli]: Principal | ICD-10-CM

## 2018-09-01 DIAGNOSIS — G9341 Metabolic encephalopathy: Secondary | ICD-10-CM

## 2018-09-01 LAB — CBC WITH DIFFERENTIAL/PLATELET
Abs Immature Granulocytes: 0.05 10*3/uL (ref 0.00–0.07)
BASOS PCT: 1 %
Basophils Absolute: 0.1 10*3/uL (ref 0.0–0.1)
EOS ABS: 0.1 10*3/uL (ref 0.0–0.5)
Eosinophils Relative: 1 %
HCT: 32.8 % — ABNORMAL LOW (ref 36.0–46.0)
Hemoglobin: 10.4 g/dL — ABNORMAL LOW (ref 12.0–15.0)
Immature Granulocytes: 1 %
Lymphocytes Relative: 17 %
Lymphs Abs: 1.3 10*3/uL (ref 0.7–4.0)
MCH: 28.6 pg (ref 26.0–34.0)
MCHC: 31.7 g/dL (ref 30.0–36.0)
MCV: 90.1 fL (ref 80.0–100.0)
MONO ABS: 0.6 10*3/uL (ref 0.1–1.0)
Monocytes Relative: 8 %
NEUTROS ABS: 6 10*3/uL (ref 1.7–7.7)
Neutrophils Relative %: 72 %
PLATELETS: 260 10*3/uL (ref 150–400)
RBC: 3.64 MIL/uL — AB (ref 3.87–5.11)
RDW: 13.2 % (ref 11.5–15.5)
WBC: 8.1 10*3/uL (ref 4.0–10.5)
nRBC: 0 % (ref 0.0–0.2)

## 2018-09-01 LAB — COMPREHENSIVE METABOLIC PANEL
ALK PHOS: 89 U/L (ref 38–126)
ALT: 18 U/L (ref 0–44)
AST: 36 U/L (ref 15–41)
Albumin: 2 g/dL — ABNORMAL LOW (ref 3.5–5.0)
Anion gap: 7 (ref 5–15)
BUN: 23 mg/dL (ref 8–23)
CALCIUM: 8.4 mg/dL — AB (ref 8.9–10.3)
CO2: 23 mmol/L (ref 22–32)
CREATININE: 0.96 mg/dL (ref 0.44–1.00)
Chloride: 101 mmol/L (ref 98–111)
GFR, EST NON AFRICAN AMERICAN: 59 mL/min — AB (ref >60)
Glucose, Bld: 150 mg/dL — ABNORMAL HIGH (ref 70–99)
Potassium: 4 mmol/L (ref 3.5–5.1)
SODIUM: 131 mmol/L — AB (ref 135–145)
Total Bilirubin: 1 mg/dL (ref 0.3–1.2)
Total Protein: 5.2 g/dL — ABNORMAL LOW (ref 6.5–8.1)

## 2018-09-01 LAB — GLUCOSE, CAPILLARY
GLUCOSE-CAPILLARY: 72 mg/dL (ref 70–99)
Glucose-Capillary: 99 mg/dL (ref 70–99)
Glucose-Capillary: 101 mg/dL — ABNORMAL HIGH (ref 70–99)
Glucose-Capillary: 73 mg/dL (ref 70–99)

## 2018-09-01 LAB — MAGNESIUM: Magnesium: 1.9 mg/dL (ref 1.7–2.4)

## 2018-09-01 LAB — PROTIME-INR
INR: 1.24
Prothrombin Time: 15.5 seconds — ABNORMAL HIGH (ref 11.4–15.2)

## 2018-09-01 LAB — LACTIC ACID, PLASMA: LACTIC ACID, VENOUS: 1.2 mmol/L (ref 0.5–1.9)

## 2018-09-01 MED ORDER — CEFDINIR 300 MG PO CAPS: 300.0000 mg | ORAL_CAPSULE | Freq: Two times a day (BID) | ORAL | 0 refills | Status: AC

## 2018-09-01 MED ORDER — FAMOTIDINE 20 MG PO TABS: 20.0000 mg | ORAL_TABLET | Freq: Every day | ORAL | 0 refills | Status: DC

## 2018-09-01 MED ORDER — VENLAFAXINE HCL ER 75 MG PO CP24: 150.0000 mg | ORAL_CAPSULE | Freq: Every day | ORAL | 0 refills | Status: DC

## 2018-09-01 MED ORDER — ENSURE ENLIVE PO LIQD: 237.0000 mL | Freq: Three times a day (TID) | ORAL | 12 refills | Status: DC

## 2018-09-01 MED ORDER — TRAMADOL HCL 50 MG PO TABS
50.0000 mg | ORAL_TABLET | Freq: Two times a day (BID) | ORAL | Status: DC | PRN
  Administered 2018-09-01: 50 mg via ORAL
  Filled 2018-09-01: qty 1

## 2018-09-01 NOTE — Care Management Important Message (Signed)
Important Message  Patient Details  Name: [name] MRN: [id number] Date of Birth: [birth year]   Medicare Important Message Given:  Yes    Leone Haven, RN 09/01/2018, 11:02 AM

## 2018-09-01 NOTE — Progress Notes (Signed)
Facility nurse called for report on patient. Gave report, clarifying order to flush nephrostomy q12 hours. Patient awaiting transport.

## 2018-09-01 NOTE — Progress Notes (Signed)
Referring Physician(s): Dr Dub Amis  Supervising Physician: Simonne Come  Patient Status:  [name] - In-pt  Chief Complaint:  Left PCN placed 10/15 Ureteral stone  Subjective:  Up in bed eating well Feels good L PCN intact OP great-- clear yellow UOP  Allergies: Patient has no known allergies.  Medications: Prior to Admission medications   Medication Sig Start Date End Date Taking? Authorizing Provider  amitriptyline (ELAVIL) 75 MG tablet Take 75 mg by mouth at bedtime. 08/10/18  Yes [provider]  amLODipine (NORVASC) 5 MG tablet Take 1 tablet (5 mg total) by mouth daily. 08/10/18  Yes Lars Masson, MD  amphetamine-dextroamphetamine (ADDERALL XR) 20 MG 24 hr capsule 2 po qam Patient taking differently: Take 20 mg by mouth daily.  08/11/18  Yes Wendling, Jilda Roche, DO  baclofen (LIORESAL) 10 MG tablet Take 10 mg by mouth 3 (three) times daily. 06/12/18  Yes [provider]  clonazePAM (KLONOPIN) 0.5 MG tablet Take 0.5 tablets (0.25 mg total) by mouth daily as needed for anxiety. 08/11/18  Yes Wendling, Jilda Roche, DO  Cyanocobalamin (VITAMIN B-12) 1000 MCG SUBL Place 10,000 mcg under the tongue daily at 12 noon.   Yes [provider]  ibuprofen (ADVIL,MOTRIN) 200 MG tablet Take 200 mg by mouth as needed.   Yes [provider]  morphine (MSIR) 15 MG tablet Take 15 mg by mouth every 12 (twelve) hours as needed for severe pain (for pain).   Yes [provider]  Oxycodone HCl 20 MG TABS Take 1 tablet by mouth 4 (four) times daily as needed (pain).  03/06/15  Yes [provider]  SUMAtriptan (IMITREX) 50 MG tablet Take 1 tablet (50 mg total) by mouth every 2 (two) hours as needed for migraine. May repeat in 2 hours if headache persists or recurs. 04/12/18  Yes [name] R, DO  venlafaxine XR (EFFEXOR-XR) 75 MG 24 hr capsule Take 3 capsules (225 mg total) by mouth daily. 02/07/18  Yes Donato Schultz, DO    doxycycline (VIBRA-TABS) 100 MG tablet Take 1 tablet (100 mg total) by mouth 2 (two) times daily. Patient not taking: Reported on 08/21/2018 07/13/18   Zola Button, Grayling Congress, DO  omeprazole (PRILOSEC) 20 MG capsule Take 1 capsule (20 mg total) by mouth daily. Patient not taking: Reported on 08/21/2018 02/07/18   Donato Schultz, DO     Vital Signs: BP 107/73 (BP Location: Left Arm)   Pulse 87   Temp 98.3 F (36.8 C) (Oral)   Resp 12   Ht 5\' 5"  (1.651 m)   Wt 143 lb 4.8 oz (65 kg)   SpO2 100%   BMI 23.85 kg/m   Physical Exam  Neurological: She is alert.  Skin: Skin is warm and dry.  L PCN intact Skin site is clean and dry NT no bleeding UOP clear yellow 1.8 L yesterday   Vitals reviewed.   Imaging: No results found.  Labs:  CBC: Recent Labs    08/29/18 0139 08/30/18 0325 08/31/18 0320 09/01/18 0237  WBC 11.2* 12.6* 8.7 8.1  HGB 9.4* 9.8* 9.6* 10.4*  HCT 28.6* 30.6* 29.8* 32.8*  PLT 121* 238 328 260    COAGS: Recent Labs    08/21/18 1815 08/22/18 0016 08/26/18 0839 09/01/18 0237  INR 1.87 1.80 2.80 1.24  APTT  --   --  57*  --     BMP: Recent Labs    08/28/18 0244 08/29/18 0139 08/30/18  9604 08/31/18 0320 08/31/18 1931  NA 134* 135 136 134*  --   K 3.1* 4.4 4.4 4.2  --   CL 104 108 107 104  --   CO2 22 22 23 25   --   GLUCOSE 87 82 85 90 91  BUN 38* 38* 33* 27*  --   CALCIUM 8.1* 8.1* 8.7* 8.6*  --   CREATININE 1.23* 1.10* 1.01* 0.94  --   GFRNONAA 43* 50* 55* >60  --   GFRAA 50* 58* >60 >60  --     LIVER FUNCTION TESTS: Recent Labs    08/21/18 1057 08/22/18 0016 08/27/18 0206 08/29/18 0139 08/31/18 0320  BILITOT 0.8  --  1.0 0.6 0.7  AST 51*  --  29 68* 57*  ALT 16  --  11 22 23   ALKPHOS 58  --  83 110 106  PROT 4.1*  --  4.1* 4.7* 5.5*  ALBUMIN 2.0* 2.2* 1.5* 1.7* 2.0*    Assessment and Plan:  L PCN intact OP clear yellow 1.8 L yesterday Plan per Dr Marlou Porch for Ureteral stone as OP To SNF soon  Electronically  Signed: Subhan Hoopes A, PA-C 09/01/2018, 8:30 AM   I spent a total of 15 Minutes at the the patient's bedside AND on the patient's hospital floor or unit, greater than 50% of which was counseling/coordinating care for L PCN

## 2018-09-01 NOTE — Progress Notes (Signed)
Physical Therapy Treatment Patient Details Name: [name] MRN: [id number] DOB: 1946/02/23 Today's Date: 09/01/2018    History of Present Illness 71 year old admitted 10/14 Found down w/ obstructive uropathy, left hydro, PNA and severe sepsis w/ resultant encephalopathy and AKI. PMH includes but not limited to: Chronic back pain, congestive heart disease,  HTN, HLD, Anxiety, ADD, spinal cord stimulator implant, back surgery.     PT Comments    Pt admitted with above diagnosis. Pt currently with functional limitations due to the deficits listed below (see PT Problem List). Pt was able to pivot to bed from recliner with min to mod assist and cues and RW.   Pt was soiled with BM and cleaned pt well.  Pt was able to use RW today but maintains flexed posture. STill needs SNF. Pt will benefit from skilled PT to increase their independence and safety with mobility to allow discharge to the venue listed below.     Follow Up Recommendations  SNF     Equipment Recommendations  None recommended by PT    Recommendations for Other Services       Precautions / Restrictions Precautions Precautions: Fall Restrictions Weight Bearing Restrictions: No    Mobility  Bed Mobility Overal bed mobility: Needs Assistance Bed Mobility: Sit to Supine       Sit to supine: Min assist   General bed mobility comments: Pt up in recliner upon arrival.  A to bed and min assist needed to bring LEs into bed  Transfers Overall transfer level: Needs assistance Equipment used: Rolling walker (2 wheeled) Transfers: Sit to/from [employer] Sit to Stand: +2 physical assistance;Mod assist Stand pivot transfers: Min assist;+2 physical assistance       General transfer comment: Pt able to scoot self foreward to front of recliner, Mod A to power up.   Pt able to take pivotal steps to bed with min  assist and cues as pt needed assist to steer RW as well as sequence steps.  Pt maintains trunk  flexion throughout.    Ambulation/Gait             General Gait Details: unable   Stairs             Wheelchair Mobility    Modified Rankin (Stroke Patients Only)       Balance Overall balance assessment: Needs assistance Sitting-balance support: No upper extremity supported;Feet supported Sitting balance-Leahy Scale: Good     Standing balance support: Bilateral upper extremity supported;During functional activity Standing balance-Leahy Scale: Poor Standing balance comment: requires min assist and external support and RW to stand with posterior lean at times.                              Cognition Arousal/Alertness: Awake/alert Behavior During Therapy: WFL for tasks assessed/performed Overall Cognitive Status: History of cognitive impairments - at baseline Area of Impairment: Orientation;Attention;Memory;Following commands;Safety/judgement;Awareness;Problem solving                 Orientation Level: Disoriented to;Time;Situation Current Attention Level: Sustained Memory: Decreased short-term memory;Decreased recall of precautions Following Commands: Follows one step commands with increased time;Follows one step commands consistently Safety/Judgement: Decreased awareness of deficits;Decreased awareness of safety Awareness: Intellectual Problem Solving: Slow processing;Decreased initiation;Difficulty sequencing;Requires verbal cues;Requires tactile cues        Exercises General Exercises - Lower Extremity Ankle Circles/Pumps: AROM;Both;10 reps;Supine Long Arc Quad: AROM;Both;10 reps;Seated    General Comments  General comments (skin integrity, edema, etc.): O2 was off on arrival and sats were 81% therefore replaced at 2L and O2 then 91%.       Pertinent Vitals/Pain Pain Assessment: Faces Faces Pain Scale: Hurts little more Pain Location: back, legs Pain Descriptors / Indicators: Aching;Sore Pain Intervention(s): Limited activity  within patient's tolerance;Monitored during session;Repositioned    Home Living                      Prior Function            PT Goals (current goals can now be found in the care plan section) Progress towards PT goals: Progressing toward goals    Frequency    Min 2X/week      PT Plan Current plan remains appropriate    Co-evaluation              AM-PAC PT "6 Clicks" Daily Activity  Outcome Measure  Difficulty turning over in bed (including adjusting bedclothes, sheets and blankets)?: Unable Difficulty moving from lying on back to sitting on the side of the bed? : Unable Difficulty sitting down on and standing up from a chair with arms (e.g., wheelchair, bedside commode, etc,.)?: A Lot Help needed moving to and from a bed to chair (including a wheelchair)?: A Lot Help needed walking in hospital room?: Total Help needed climbing 3-5 steps with a railing? : Total 6 Click Score: 8    End of Session Equipment Utilized During Treatment: Gait belt;Oxygen Activity Tolerance: Patient limited by fatigue Patient left: with call bell/phone within reach;in bed;with bed alarm set Nurse Communication: Mobility status PT Visit Diagnosis: Unsteadiness on feet (R26.81);Muscle weakness (generalized) (M62.81);Adult, failure to thrive (R62.7);Pain Pain - part of body: Ankle and joints of foot     Time: 1030-1104 PT Time Calculation (min) (ACUTE ONLY): 34 min  Charges:  $Therapeutic Exercise: 8-22 mins $Therapeutic Activity: 8-22 mins                     Haddon Fyfe,PT Acute Rehabilitation Services Pager:  [phone]  Office:  [phone]     Berline Lopes 09/01/2018, 1:13 PM

## 2018-09-01 NOTE — Clinical Social Work Note (Signed)
Clinical Social Worker facilitated patient discharge including contacting patient family and facility to confirm patient discharge plans.  Clinical information faxed to facility and family agreeable with plan.  CSW arranged ambulance transport via [facility] to [facility].  RN to call for report prior to discharge.  Clinical Social Worker will sign off for now as social work intervention is no longer needed. Please consult [place] again if new need arises.  [place], [place] [phone]

## 2018-09-01 NOTE — Clinical Social Work Placement (Signed)
   CLINICAL SOCIAL WORK PLACEMENT  NOTE  Date:  09/01/2018  Patient Details  Name: [name] MRN: [id number] Date of Birth: 08/27/1946  Clinical Social Work is seeking post-discharge placement for this patient at the Skilled  Nursing Facility level of care (*CSW will initial, date and re-position this form in  chart as items are completed):  Yes   Patient/family provided with [facility] Clinical Social Work Department's list of facilities offering this level of care within the geographic area requested by the patient (or if unable, by the patient's family).  Yes   Patient/family informed of their freedom to choose among providers that offer the needed level of care, that participate in Medicare, Medicaid or managed care program needed by the patient, have an available bed and are willing to accept the patient.  Yes   Patient/family informed of [facility]'s ownership interest in [facility] and [facility], as well as of the fact that they are under no obligation to receive care at these facilities.  PASRR submitted to EDS on 08/28/18     PASRR number received on 08/28/18     Existing PASRR number confirmed on       FL2 transmitted to all facilities in geographic area requested by pt/family on 08/28/18     FL2 transmitted to all facilities within larger geographic area on       Patient informed that his/her managed care company has contracts with or will negotiate with certain facilities, including the following:        Yes   Patient/family informed of bed offers received.  Patient chooses bed at [facility]     Physician recommends and patient chooses bed at      Patient to be transferred to [facility] on 09/01/18.  Patient to be transferred to facility by [facility]     Patient family notified on 09/01/18 of transfer.  Name of family member notified:  [name]     PHYSICIAN Please prepare priority discharge summary, including medications, Please prepare  prescriptions     Additional Comment:    _______________________________________________ Maree Krabbe, LCSW 09/01/2018, 10:54 AM

## 2018-09-01 NOTE — Discharge Summary (Signed)
[facility] Discharge Summary   Patient: [name] WUJ:[id number]   PCP: Donato Schultz, DO DOB: 1946-08-29   Date of admission: 08/21/2018   Date of discharge:  09/01/2018    Discharge Diagnoses:  Principal diagnosis Sepsis due to obstructive uropathy due to UPJ stone and E coli bacteremia   Principal Problem:   Sepsis (HCC) Active Problems:   ANEMIA, MILD   Mild diastolic dysfunction   Essential hypertension   CAP (community acquired pneumonia)   Acute encephalopathy   Obstruction of left ureteropelvic junction (UPJ) due to stone   Hypoalbuminemia   Chronic pain disorder   Thrombocytopenia (HCC)   Acute renal failure (ARF) (HCC)   Compression fracture of spine (HCC)   Pressure injury of skin   Protein-calorie malnutrition, severe   Admitted From: home Disposition:  SNF  Recommendations for Outpatient Follow-up:  1. Please follow up with PCP in 1 week, adjust psychotropic meds that are changed or held on discharge.   2. Please follow up with Dr Marlou Porch In 1 -2 weeks for definitive treatment of the UPJ stone and management of the nephrostomy tube   Contact information for follow-up providers    Crist Fat, MD. Schedule an appointment as soon as possible for a visit in 3 week(s).   Specialty:  Urology Contact information: [address] [place] [address] [phone]        Donato Schultz, DO. Schedule an appointment as soon as possible for a visit in 1 week(s).   Specialty:  Family Medicine Contact information: [address] [place] [address] [phone]        Lars Masson, MD .   Specialty:  Cardiology Contact information: [address] [place] [address] [phone]            Contact information for after-discharge care    Destination    [facility] .   Service:  Skilled Nursing Contact information: [address] [place]  [address] [phone]                 Diet recommendation: Dysphagia 2 (fine chop);Thin liquid Liquids provided via: Cup;Straw Medication Administration: Whole meds with liquid Supervision: Patient able to self feed;Intermittent supervision to cue for compensatory strategies Compensations: Minimize environmental distractions;Slow rate;Small sips/bites Postural Changes and/or Swallow Maneuvers: Seated upright 90 degrees  Activity: The patient is advised to gradually reintroduce usual activities.  Discharge Condition: good  Code Status: full code  History of present illness: As per the H and P dictated on admission, "72 year old female presented to ED on 10/14 after being found unresponsive by husband. Apparently had fall couple days prior. Had been getting increased narcotics at home for back pain. On  Arrival found responsive only to pain, hypotensive w/ sbp in 70s, and hypoxic (80s on room air). LA was initially 3.09. Cultures sent. Received 3 liters crystalloid, cr  Was 2.92. CT abd/pelvis showed mod left hydro w/ 6mm stone. Her GB was slightly distended also showed L>R changes c/w PNA. As BP remained in 90s and required high flow O2 PCCM asked to see. "  Hospital Course:  Summary of her active problems in the hospital is as following. 1.Sepsis secondary to E. coli with bacteremia. Complicated UTI with obstructing UPJ stone. S/P percutaneous nephrostomy tube placement by IR on 1015. Foley catheter placement on 08/21/2018. Urology was consulted recommend outpatient follow-up. 2 weeks of IV  antibiotic recommended urology. E. coli resistant to ampicillin and Bactrim. Now tolerating oral very well and therefore we will transition her to oral antibiotic and monitor.  2.Acute metabolic and potentially toxic encephalopathy. Presented with confusion.  Initially secondary from sepsis as well as withdrawal from pain medication. I suspect that her current encephalopathy is rather  due to  Polypharmacy. CCM felt that the patient also had opiate overdose followed by opiate withdrawal. Patient is currently on baclofen, Norco, amitriptyline, venlafaxine.  I suspect polypharmacy is playing a role here and will discontinue all the medications. Metabolic work-up TSH ammonia and B12 level unremarkable. Improvement after holding the meds.  Will start on lower dose venlafaxine and PCP at the SNF can up or down titrate other meds   3.Acute hypoxic respiratory failure. Aspiration pneumonia. Treated with IV antibiotics. Oxygenation getting better now on room air.  4.Acute kidney injury. Left-sided hydronephrosis. S/P percutaneous drain. Outpatient follow-up with urology for definitive treatment of the stone. Renal function significantly better now back to normal. Foley catheter removed  5.Chronic back pain. Walks with a cane at her baseline. Still with considerable confusion. Hold PRN oxycodone, baclofen.  6. Anemianormocytic, likely nutritional and dilutional Hb stable  Recently checked iron levels and B12 levels are normal. Folic acid 11. Monitor.  7. Severe Malnutrition Hypoglycemia Etiology: chronic illness(suspected malabsorption from bariatric surgery in 2008) Interventions: Interventions: Refer to RD note for recommendations Patient has adequate oral intake but remained hypoglycemic for some reason.  She was given d10 infusion.  After holding the infusion the pt was able to maintain normoglycemia.   8.Hypokalemia. Replaced.  9.Thrombocytopenia. Labs are stable.  11.Essential hypertension. Blood pressure stable. Monitor.  12.  Raynaud's phenomenon. Etiology not clear.  Continue supportive measures  Pressure Injury 08/21/18 Deep Tissue Injury - Purple or maroon localized area of discolored intact skin or blood-filled blister due to damage of underlying soft tissue from pressure and/or shear. Red and Purple area to right lower  lateral back and mid sa (Active)  08/21/18 1800   Location: Sacrum  Location Orientation: Mid  Staging: Deep Tissue Injury - Purple or maroon localized area of discolored intact skin or blood-filled blister due to damage of underlying soft tissue from pressure and/or shear.  Wound Description (Comments): Red and Purple area to right lower lateral back and mid sacrum over coccyx   Present on Admission: Yes     Pressure Injury 08/22/18 Deep Tissue Injury - Purple or maroon localized area of discolored intact skin or blood-filled blister due to damage of underlying soft tissue from pressure and/or shear. red purple half moon shaped contour of heel 4 cm (Active)  08/22/18 1000   Location: Heel  Location Orientation: Right;Left  Staging: Deep Tissue Injury - Purple or maroon localized area of discolored intact skin or blood-filled blister due to damage of underlying soft tissue from pressure and/or shear.  Wound Description (Comments): red purple half moon shaped contour of heel 4 cm  Present on Admission: Yes     All other chronic medical condition were stable during the hospitalization.  Patient was seen by physical therapy, who recommended SNF, which was arranged by Child psychotherapist and case Production designer, theatre/television/film. On the day of the discharge the patient's vitals were stable , and no other acute medical condition were reported by patient. the patient was felt safe to be discharge at SNF with therapy.  Consultants: PCCM, Urology, IR Procedures:  IR guided PCN placement on left  DISCHARGE MEDICATION: Allergies as of 09/01/2018  No Known Allergies     Medication List    STOP taking these medications   amitriptyline 75 MG tablet Commonly known as:  ELAVIL   amLODipine 5 MG tablet Commonly known as:  NORVASC   baclofen 10 MG tablet Commonly known as:  LIORESAL   clonazePAM 0.5 MG tablet Commonly known as:  KLONOPIN   doxycycline 100 MG tablet Commonly known as:  VIBRA-TABS   ibuprofen 200 MG  tablet Commonly known as:  ADVIL,MOTRIN   morphine 15 MG tablet Commonly known as:  MSIR   omeprazole 20 MG capsule Commonly known as:  PRILOSEC   Oxycodone HCl 20 MG Tabs     TAKE these medications   amphetamine-dextroamphetamine 20 MG 24 hr capsule Commonly known as:  ADDERALL XR 2 po qam What changed:    how much to take  how to take this  when to take this  additional instructions   cefdinir 300 MG capsule Commonly known as:  OMNICEF Take 1 capsule (300 mg total) by mouth every 12 (twelve) hours for 4 days.   famotidine 20 MG tablet Commonly known as:  PEPCID Take 1 tablet (20 mg total) by mouth daily. Start taking on:  09/02/2018   feeding supplement (ENSURE ENLIVE) Liqd Take 237 mLs by mouth 3 (three) times daily between meals.   SUMAtriptan 50 MG tablet Commonly known as:  IMITREX Take 1 tablet (50 mg total) by mouth every 2 (two) hours as needed for migraine. May repeat in 2 hours if headache persists or recurs.   venlafaxine XR 75 MG 24 hr capsule Commonly known as:  EFFEXOR-XR Take 2 capsules (150 mg total) by mouth daily. What changed:  how much to take   Vitamin B-12 1000 MCG Subl Place 10,000 mcg under the tongue daily at 12 noon.      No Known Allergies Discharge Instructions    DIET DYS 2   Complete by:  As directed    Diet recommendations: Dysphagia 2 (fine chop);Thin liquid Liquids provided via: Cup;Straw Medication Administration: Whole meds with liquid Supervision: Patient able to self feed;Intermittent supervision to cue for compensatory strategies Compensations: Minimize environmental distractions;Slow rate;Small sips/bites Postural Changes and/or Swallow Maneuvers: Seated upright 90 degrees   Fluid consistency:  Thin   Diet - low sodium heart healthy   Complete by:  As directed    Discharge instructions   Complete by:  As directed    It is important that you read following instructions as well as go over your medication list  with RN to help you understand your care after this hospitalization.  Discharge Instructions: Please follow-up with PCP in one week  Please request your primary care physician to go over all Hospital Tests and Procedure/Radiological results at the follow up,  Please get all Hospital records sent to your PCP by signing hospital release before you go home.   Do not take more than prescribed Pain, Sleep and Anxiety Medications. You were cared for by a hospitalist during your hospital stay. If you have any questions about your discharge medications or the care you received while you were in the hospital after you are discharged, you can call the unit you were admitted to and ask to speak with the hospitalist on call if the hospitalist that took care of you is not available.  Once you are discharged, your primary care physician will handle any further medical issues. Please note that NO REFILLS for any discharge medications will be authorized once you are  discharged, as it is imperative that you return to your primary care physician (or establish a relationship with a primary care physician if you do not have one) for your aftercare needs so that they can reassess your need for medications and monitor your lab values. You Must read complete instructions/literature along with all the possible adverse reactions/side effects for all the Medicines you take and that have been prescribed to you. Take any new Medicines after you have completely understood and accept all the possible adverse reactions/side effects.   Increase activity slowly   Complete by:  As directed      Discharge Exam: Filed Weights   08/28/18 0300 08/29/18 0213 08/30/18 0500  Weight: 66.8 kg 68.3 kg 65 kg   Vitals:   09/01/18 0008 09/01/18 0754  BP: 125/74 107/73  Pulse: 74 87  Resp: 16 12  Temp: 97.9 F (36.6 C) 98.3 F (36.8 C)  SpO2: 95% 100%   General: Appear in no distress, no Rash; Oral Mucosa moist. Cardiovascular: S1  and S2 Present, no Murmur, no JVD Respiratory: Bilateral Air entry present and Clear to Auscultation, no Crackles, no wheezes Abdomen: Bowel Sound present, Soft and no tenderness Extremities: no Pedal edema, no calf tenderness Neurology: Grossly no focal neuro deficit.  The results of significant diagnostics from this hospitalization (including imaging, microbiology, ancillary and laboratory) are listed below for reference.    Significant Diagnostic Studies: Ct Abdomen Pelvis Wo Contrast  Result Date: 08/21/2018 CLINICAL DATA:  Fall yesterday.  Abdominal pain. EXAM: CT ABDOMEN AND PELVIS WITHOUT CONTRAST TECHNIQUE: Multidetector CT imaging of the abdomen and pelvis was performed following the standard protocol without IV contrast. COMPARISON:  07/17/2017. FINDINGS: Lower chest: Bibasilar dependent atelectasis. Heart size normal. No pericardial or pleural effusion. Hepatobiliary: Liver is grossly unremarkable. Gallbladder is distended. No definite biliary ductal dilatation. Pancreas: Grossly unremarkable. Spleen: Negative. Adrenals/Urinary Tract: Adrenal glands and right kidney are grossly unremarkable. Moderate left hydronephrosis secondary to a 6 mm stone at the left ureteropelvic junction. Air is seen in the bladder and is presumably iatrogenic. Stomach/Bowel: Postoperative changes involving the stomach. Stomach and small bowel are otherwise grossly unremarkable. Fair amount of stool is seen in the colon, indicative of constipation. Vascular/Lymphatic: Atherosclerotic calcification of the arterial vasculature without abdominal aortic aneurysm. No definite pathologically enlarged lymph nodes. Reproductive: No adnexal mass. Other: No free fluid. Mesenteries and peritoneum are grossly unremarkable. Musculoskeletal: Degenerative changes in the spine. No worrisome lytic or sclerotic lesions. Spinal stimulator wires are seen in the lower thoracic spine. Thoracolumbar compression deformities with L1 vertebral  body augmentation. IMPRESSION: 1. Moderate left hydronephrosis secondary to a 6 mm stone at the left ureteropelvic junction. 2. Stool throughout the colon is indicative of constipation. 3. Gallbladder is distended. Please correlate for right upper quadrant symptoms. 4.  Aortic atherosclerosis (ICD10-170.0). Electronically Signed   By: Leanna Battles M.D.   On: 08/21/2018 14:22   Ct Head Wo Contrast  Result Date: 08/21/2018 CLINICAL DATA:  Recent fall with head injury, initial encounter EXAM: CT HEAD WITHOUT CONTRAST CT CERVICAL SPINE WITHOUT CONTRAST TECHNIQUE: Multidetector CT imaging of the head and cervical spine was performed following the standard protocol without intravenous contrast. Multiplanar CT image reconstructions of the cervical spine were also generated. COMPARISON:  None. FINDINGS: CT HEAD FINDINGS Brain: Mild atrophic changes are noted. No findings to suggest acute hemorrhage, acute infarction or space-occupying mass lesion are seen. Mild chronic white matter ischemic changes are noted as well. Vascular: No hyperdense vessel or unexpected  calcification. Skull: Normal. Negative for fracture or focal lesion. Sinuses/Orbits: No acute finding. Other: None. CT CERVICAL SPINE FINDINGS Alignment: Alignment is well maintained. Skull base and vertebrae: 7 cervical segments are well visualized. Vertebral body height is well maintained. Multilevel facet hypertrophic changes are noted. No acute fracture or acute facet abnormality is seen. Multilevel neural foraminal changes are noted. Soft tissues and spinal canal: Surrounding soft tissues are within normal limits. Upper chest: Scarring is noted in the left apex. No other focal abnormality is noted. Other: None IMPRESSION: CT of the head: Chronic atrophic and ischemic changes without acute abnormality. CT of the cervical spine: Multilevel degenerative change without acute abnormality. Electronically Signed   By: Alcide Clever M.D.   On: 08/21/2018 12:25    Ct Cervical Spine Wo Contrast  Result Date: 08/21/2018 CLINICAL DATA:  Recent fall with head injury, initial encounter EXAM: CT HEAD WITHOUT CONTRAST CT CERVICAL SPINE WITHOUT CONTRAST TECHNIQUE: Multidetector CT imaging of the head and cervical spine was performed following the standard protocol without intravenous contrast. Multiplanar CT image reconstructions of the cervical spine were also generated. COMPARISON:  None. FINDINGS: CT HEAD FINDINGS Brain: Mild atrophic changes are noted. No findings to suggest acute hemorrhage, acute infarction or space-occupying mass lesion are seen. Mild chronic white matter ischemic changes are noted as well. Vascular: No hyperdense vessel or unexpected calcification. Skull: Normal. Negative for fracture or focal lesion. Sinuses/Orbits: No acute finding. Other: None. CT CERVICAL SPINE FINDINGS Alignment: Alignment is well maintained. Skull base and vertebrae: 7 cervical segments are well visualized. Vertebral body height is well maintained. Multilevel facet hypertrophic changes are noted. No acute fracture or acute facet abnormality is seen. Multilevel neural foraminal changes are noted. Soft tissues and spinal canal: Surrounding soft tissues are within normal limits. Upper chest: Scarring is noted in the left apex. No other focal abnormality is noted. Other: None IMPRESSION: CT of the head: Chronic atrophic and ischemic changes without acute abnormality. CT of the cervical spine: Multilevel degenerative change without acute abnormality. Electronically Signed   By: Alcide Clever M.D.   On: 08/21/2018 12:25   Dg Pelvis Portable  Result Date: 08/21/2018 CLINICAL DATA:  Pelvic pain EXAM: PORTABLE PELVIS 1-2 VIEWS COMPARISON:  None. FINDINGS: Pelvic ring is intact. No acute fracture is noted. Mild degenerative changes of the hip joints are seen. No soft tissue changes are noted. IMPRESSION: Mild degenerative changes.  No acute abnormality seen. Electronically Signed   By:  Alcide Clever M.D.   On: 08/21/2018 11:20   Ct L-spine No Charge  Result Date: 08/21/2018 CLINICAL DATA:  Back pain.  Fall.  Prior lumbar fractures. EXAM: CT LUMBAR SPINE WITHOUT CONTRAST TECHNIQUE: Multidetector CT imaging of the lumbar spine was performed without intravenous contrast administration. Multiplanar CT image reconstructions were also generated. COMPARISON:  CT lumbar myelogram 08/08/2017 FINDINGS: Segmentation: Normal Alignment: Mild anterolisthesis T11-T12, L1-2, L2-3, L3-4, L5-S1 Vertebrae: Chronic severe compression fracture T12 unchanged Chronic compression fracture L1 with interval cement vertebroplasty. No further fracture. Moderate to severe chronic compression fracture of L2 unchanged Mild superior endplate fracture of L3 unchanged Moderate compression fracture of L4 is new. Horizontal fracture line with sclerotic margins suggesting a subacute fracture with partial healing. This was not present previously. Moderate dextroscoliosis.  Negative for sacral fracture. Paraspinal and other soft tissues: Mild atherosclerotic aorta. No paraspinous soft tissue mass. Disc levels: T12-L1: Negative for stenosis L1-2: Negative for stenosis L2-3: Moderate right foraminal stenosis due to facet hypertrophy. C3-4: Mild facet degeneration without  stenosis L4-5: Moderate right foraminal encroachment due to marked facet hypertrophy. Mild spinal stenosis L5-S1: Moderate facet degeneration bilaterally with subarticular stenosis bilaterally. IMPRESSION: Chronic compression fractures of T12, L1, L2, and L3 Moderate compression of fracture of L4 not present on 08/08/2017. This may be a subacute fracture given the sclerotic margins to the fracture. No retropulsion of bone and no spinal stenosis Multilevel degenerative change throughout the lumbar spine appears chronic. Electronically Signed   By: Marlan Palau M.D.   On: 08/21/2018 14:36   Dg Chest Port 1 View  Result Date: 08/26/2018 CLINICAL DATA:  Tachypnea  this morning. EXAM: PORTABLE CHEST 1 VIEW COMPARISON:  08/24/2018 FINDINGS: Lungs are hypoinflated with persistent patchy bilateral airspace process right worse than left with mild improved aeration over the right mid to upper lung. No definite effusion. Cardiomediastinal silhouette and remainder of the exam is unchanged. IMPRESSION: Mild interval improvement and patchy bilateral multifocal airspace process right worse than left likely improving multifocal pneumonia. Electronically Signed   By: Elberta Fortis M.D.   On: 08/26/2018 11:05   Dg Chest Port 1 View  Result Date: 08/24/2018 CLINICAL DATA:  Sepsis and pneumonia. EXAM: PORTABLE CHEST 1 VIEW COMPARISON:  08/23/2018 FINDINGS: The heart size and mediastinal contours are within normal limits. Persistent low bilateral lung volumes. Airspace disease of the right lung appears slightly more dense and is consistent with pneumonia. Left basilar opacity stable to slightly improved and consistent with atelectasis versus pneumonia. There may be small bilateral pleural effusions. No pneumothorax. Stable heart size and appearance of spinal stimulator. IMPRESSION: Slightly more dense appearance right lung pneumonia. Left basilar atelectasis versus pneumonia is stable to slightly improved. Electronically Signed   By: Irish Lack M.D.   On: 08/24/2018 09:20   Dg Chest Port 1 View  Result Date: 08/23/2018 CLINICAL DATA:  Acute respiratory failure. EXAM: PORTABLE CHEST 1 VIEW COMPARISON:  Radiograph of August 22, 2018. FINDINGS: Stable cardiomegaly. Hypoinflation of the lungs is noted with mild bibasilar subsegmental atelectasis, left greater than right. Increased right upper lobe airspace opacity is noted concerning for possible pneumonia. Bony thorax is unremarkable. No pneumothorax is noted. IMPRESSION: Stable hypoinflation of the lungs with mild bibasilar subsegmental atelectasis. Increased right upper lobe airspace opacity is noted concerning for pneumonia.  Electronically Signed   By: Lupita Raider, M.D.   On: 08/23/2018 10:02   Dg Chest Port 1 View  Result Date: 08/22/2018 CLINICAL DATA:  Pneumonia. Shortness of breath. EXAM: PORTABLE CHEST 1 VIEW COMPARISON:  08/21/2018 FINDINGS: The cardiomediastinal silhouette is unchanged. The lungs are hypoinflated with lung volumes being slightly lower than on the prior study. Patchy left greater than right basilar opacities have increased. No sizable pleural effusion or pneumothorax is identified. IMPRESSION: Low lung volumes with increased left greater than right basilar opacities which may reflect atelectasis or pneumonia. Electronically Signed   By: Sebastian Ache M.D.   On: 08/22/2018 08:37   Dg Chest Portable 1 View  Result Date: 08/21/2018 CLINICAL DATA:  Altered mental status EXAM: PORTABLE CHEST 1 VIEW COMPARISON:  10/11/2016 FINDINGS: Cardiac shadows within normal limits. Spinal stimulator is noted midthoracic spine. Overall inspiratory effort is poor. Patchy infiltrative changes are noted in the left base new from the prior exam. No bony abnormality is seen. IMPRESSION: New left basilar infiltrate. Electronically Signed   By: Alcide Clever M.D.   On: 08/21/2018 11:16   Ir Nephrostomy Placement Left  Result Date: 08/22/2018 CLINICAL DATA:  Left-sided hydronephrosis and sepsis secondary to  obstructing calculus in the proximal ureter. Left percutaneous nephrostomy tube has been requested to decompress the obstructed left kidney. EXAM: 1. ULTRASOUND GUIDANCE FOR PUNCTURE OF THE LEFT RENAL COLLECTING SYSTEM. 2. LEFT PERCUTANEOUS NEPHROSTOMY TUBE PLACEMENT. COMPARISON:  CT of the abdomen and pelvis on 08/21/2018 ANESTHESIA/SEDATION: 0.5 mg IV Versed Total Moderate Sedation Time 13 minutes. The patient's level of consciousness and physiologic status were continuously monitored during the procedure by Radiology nursing. CONTRAST:  10 ml Omnipaque 300 injected into to the collecting system MEDICATIONS: No  additional medications. FLUOROSCOPY TIME:  1.0 minutes.  15.1 mGy. PROCEDURE: The procedure, risks, benefits, and alternatives were explained to the patient's husband. Questions regarding the procedure were encouraged and answered. The patient's husband understands and consents to the procedure. A time-out was performed prior to initiating the procedure. The left flank region was prepped with Betadine in a sterile fashion, and a sterile drape was applied covering the operative field. A sterile gown and sterile gloves were used for the procedure. Local anesthesia was provided with 1% Lidocaine. Ultrasound was used to localize the left kidney. Under direct ultrasound guidance, a 21 gauge needle was advanced into the renal collecting system. Ultrasound image documentation was performed. Aspiration of urine sample was performed followed by contrast injection. A transitional dilator was advanced over a guidewire. Percutaneous tract dilatation was then performed over the guidewire. A 10-French percutaneous nephrostomy tube was then advanced and formed in the collecting system. Catheter position was confirmed by fluoroscopy after contrast injection. The catheter was secured at the skin with a Prolene retention suture and Stat-Lock device. A gravity bag was placed. COMPLICATIONS: None. FINDINGS: Ultrasound confirms moderate left hydronephrosis. A 10 French nephrostomy tube was placed and formed in the renal pelvis. There is return bloody urine after tube placement. A urine sample was sent for culture analysis. IMPRESSION: Placement of 10 French left percutaneous nephrostomy tube. The tube will be left to gravity bag drainage. A urine sample aspirated from the renal pelvis was sent for culture analysis. Electronically Signed   By: Irish Lack M.D.   On: 08/22/2018 10:49    Microbiology: No results found for this or any previous visit (from the past 240 hour(s)).   Labs: CBC: Recent Labs  Lab 08/27/18 0206  08/28/18 0244 08/29/18 0139 08/30/18 0325 08/31/18 0320 09/01/18 0237  WBC 13.0* 12.7* 11.2* 12.6* 8.7 8.1  NEUTROABS 10.7*  --   --   --  6.2 6.0  HGB 10.0* 10.3* 9.4* 9.8* 9.6* 10.4*  HCT 29.8* 30.8* 28.6* 30.6* 29.8* 32.8*  MCV 86.4 86.0 88.5 90.8 90.9 90.1  PLT 36* 84* 121* 238 328 260   Basic Metabolic Panel: Recent Labs  Lab 08/26/18 0314 08/27/18 0206  08/28/18 0244 08/29/18 0139 08/30/18 0325 08/31/18 0320 08/31/18 1931 09/01/18 0237 09/01/18 0907  NA 134* 133*   < > 134* 135 136 134*  --   --  131*  K 3.7 2.3*   < > 3.1* 4.4 4.4 4.2  --   --  4.0  CL 104 101   < > 104 108 107 104  --   --  101  CO2 17* 23   < > 22 22 23 25   --   --  23  GLUCOSE 97 101*   < > 87 82 85 90 91  --  150*  BUN 45* 41*   < > 38* 38* 33* 27*  --   --  23  CREATININE 1.50* 1.38*   < >  1.23* 1.10* 1.01* 0.94  --   --  0.96  CALCIUM 7.8* 7.8*   < > 8.1* 8.1* 8.7* 8.6*  --   --  8.4*  MG 2.1 1.8  --   --  1.9  --  2.0  --  1.9  --   PHOS 3.2  --   --   --   --   --   --   --   --   --    < > = values in this interval not displayed.   Liver Function Tests: Recent Labs  Lab 08/27/18 0206 08/29/18 0139 08/31/18 0320 09/01/18 0907  AST 29 68* 57* 36  ALT 11 22 23 18   ALKPHOS 83 110 106 89  BILITOT 1.0 0.6 0.7 1.0  PROT 4.1* 4.7* 5.5* 5.2*  ALBUMIN 1.5* 1.7* 2.0* 2.0*   No results for input(s): LIPASE, AMYLASE in the last 168 hours. Recent Labs  Lab 08/30/18 0934  AMMONIA 20   Cardiac Enzymes: No results for input(s): CKTOTAL, CKMB, CKMBINDEX, TROPONINI in the last 168 hours. BNP (last 3 results) No results for input(s): BNP in the last 8760 hours. CBG: Recent Labs  Lab 08/31/18 1742 08/31/18 1920 09/01/18 0001 09/01/18 0449 09/01/18 0754  GLUCAP 30* 120* 72 99 101*   Time spent: 35 minutes  Signed:  Lynden Oxford  [facility]  09/01/2018  , 12:09 PM

## 2018-09-05 ENCOUNTER — Telehealth: Payer: Self-pay | Admitting: Cardiology

## 2018-09-05 NOTE — Telephone Encounter (Signed)
Spoke with the pts spouse [name] (on [place]) and informed him that I was reviewing her discharge AVS/paperwork from the hospital, and amlodipine, along with multiple medications, were discontinued in the hospital and stopped at discharge.  Endorsed to the pts husband that amlodipine was discontinued by Dr Allena Katz, for the pt was having renal issues and this med was stopped  to prevent worsening of multiple renal problems while in the hospital.  Husband states that the nursing home has her discharge papers and all the meds that were started and stopped, but he would like a copy of the PATIENT Discharge instructions that they provide at discharge from the hospital, for his records.  Informed the spouse that I will print off the pt discharge instructions for him to come and pick up at the front desk of our office, at a convenient time.  Pts spouse verbalized understanding and agrees with this plan.  Husband gracious for all the assistance provided.

## 2018-09-05 NOTE — Telephone Encounter (Signed)
New Message   Pt c/o medication issue:  1. Name of Medication: amolodipine  2. How are you currently taking this medication (dosage and times per day)?   3. Are you having a reaction (difficulty breathing--STAT)?   4. What is your medication issue? Patients spouse is calling on his behalf. He states that his wife takes amolodipine but its not showing on her prescription list. She is now at a nursing facility and they are wanting to confirm what medications she should be taking. [facility] is requesting a fax to confirm this medication at [phone] attention Xan Sparkman. Please call to discuss.

## 2018-09-25 ENCOUNTER — Telehealth: Payer: Self-pay | Admitting: Family Medicine

## 2018-09-25 NOTE — Telephone Encounter (Signed)
Copied from CRM [id number]. Topic: Quick Communication - Home Health Verbal Orders >> Sep 25, 2018  2:48 PM Crist InfanteHarrald, Kathy J wrote: Caller/Agency: [facility] Callback Number:  [phone] Requesting Verbal PT orders 2 wk 5

## 2018-09-25 NOTE — Telephone Encounter (Signed)
Spoke w/ Kevin, verbal orders given.  

## 2018-09-26 ENCOUNTER — Other Ambulatory Visit: Payer: Self-pay | Admitting: Urology

## 2018-09-26 ENCOUNTER — Other Ambulatory Visit: Payer: Self-pay

## 2018-09-26 ENCOUNTER — Telehealth: Payer: Self-pay | Admitting: Family Medicine

## 2018-09-26 ENCOUNTER — Encounter (HOSPITAL_COMMUNITY): Payer: Self-pay | Admitting: *Deleted

## 2018-09-26 NOTE — Telephone Encounter (Signed)
Copied from CRM [id number]. Topic: General - Other >> Sep 26, 2018  4:24 PM Elliot GaultBell, Tiffany M wrote: Jethro Bolusaller name: [name]  Relation to pt[facility] Call back number: [phone]    Reason for call:  Request verbal orders for 2x 2 1x 3 focusing on functioning strength, balance, household managing and grip strength, please advise

## 2018-09-26 NOTE — Progress Notes (Signed)
Patien treports history of " bumps' on legs in past.  None on legs currently per patient.

## 2018-09-27 ENCOUNTER — Encounter (HOSPITAL_COMMUNITY)
Admission: RE | Disposition: A | Discharge status: Home / Self Care | Payer: Self-pay | Source: Ambulatory Visit | Attending: Urology

## 2018-09-27 ENCOUNTER — Ambulatory Visit (HOSPITAL_COMMUNITY): Payer: Medicare Other

## 2018-09-27 ENCOUNTER — Ambulatory Visit (HOSPITAL_COMMUNITY): Payer: Medicare Other | Admitting: Anesthesiology

## 2018-09-27 ENCOUNTER — Encounter (HOSPITAL_COMMUNITY): Payer: Self-pay | Admitting: *Deleted

## 2018-09-27 ENCOUNTER — Ambulatory Visit (HOSPITAL_COMMUNITY)
Admission: RE | Admit: 2018-09-27 | Discharge: 2018-09-27 | Disposition: A | Discharge status: Home / Self Care | Payer: Medicare Other | Source: Ambulatory Visit | Attending: Urology | Admitting: Urology

## 2018-09-27 DIAGNOSIS — N135 Crossing vessel and stricture of ureter without hydronephrosis: Secondary | ICD-10-CM | POA: Diagnosis not present

## 2018-09-27 DIAGNOSIS — N201 Calculus of ureter: Secondary | ICD-10-CM | POA: Diagnosis not present

## 2018-09-27 DIAGNOSIS — Z01818 Encounter for other preprocedural examination: Secondary | ICD-10-CM

## 2018-09-27 HISTORY — DX: Presence of spectacles and contact lenses: Z97.3

## 2018-09-27 HISTORY — PX: CYSTOSCOPY WITH URETEROSCOPY AND STENT PLACEMENT: SHX6377

## 2018-09-27 HISTORY — DX: Nausea with vomiting, unspecified: R11.2

## 2018-09-27 HISTORY — DX: Other specified postprocedural states: Z98.890

## 2018-09-27 LAB — BASIC METABOLIC PANEL
ANION GAP: 7 (ref 5–15)
BUN: 18 mg/dL (ref 8–23)
CALCIUM: 9.4 mg/dL (ref 8.9–10.3)
CO2: 25 mmol/L (ref 22–32)
Chloride: 108 mmol/L (ref 98–111)
Creatinine, Ser: 1.1 mg/dL — ABNORMAL HIGH (ref 0.44–1.00)
GFR, EST AFRICAN AMERICAN: 58 mL/min — AB (ref >60)
GFR, EST NON AFRICAN AMERICAN: 50 mL/min — AB (ref >60)
Glucose, Bld: 82 mg/dL (ref 70–99)
POTASSIUM: 4.3 mmol/L (ref 3.5–5.1)
Sodium: 140 mmol/L (ref 135–145)

## 2018-09-27 LAB — CBC
HCT: 32.7 % — ABNORMAL LOW (ref 36.0–46.0)
HEMOGLOBIN: 9.9 g/dL — AB (ref 12.0–15.0)
MCH: 29.1 pg (ref 26.0–34.0)
MCHC: 30.3 g/dL (ref 30.0–36.0)
MCV: 96.2 fL (ref 80.0–100.0)
NRBC: 0 % (ref 0.0–0.2)
PLATELETS: 290 10*3/uL (ref 150–400)
RBC: 3.4 MIL/uL — AB (ref 3.87–5.11)
RDW: 14.5 % (ref 11.5–15.5)
WBC: 5.3 10*3/uL (ref 4.0–10.5)

## 2018-09-27 SURGERY — CYSTOURETEROSCOPY, WITH STENT INSERTION
Anesthesia: General | Laterality: Left

## 2018-09-27 MED ORDER — GENTAMICIN SULFATE 40 MG/ML IJ SOLN: 240.0000 mg | INTRAVENOUS | Status: DC

## 2018-09-27 MED ORDER — ONDANSETRON HCL 4 MG/2ML IJ SOLN
INTRAMUSCULAR | Status: DC | PRN
  Administered 2018-09-27: 4 mg via INTRAVENOUS

## 2018-09-27 MED ORDER — EPHEDRINE 5 MG/ML INJ
INTRAVENOUS | Status: AC
  Filled 2018-09-27: qty 10

## 2018-09-27 MED ORDER — LIDOCAINE 2% (20 MG/ML) 5 ML SYRINGE
INTRAMUSCULAR | Status: DC | PRN
  Administered 2018-09-27: 50 mg via INTRAVENOUS

## 2018-09-27 MED ORDER — OXYCODONE HCL 15 MG PO TABS: 15.0000 mg | ORAL_TABLET | ORAL | 0 refills | Status: AC | PRN

## 2018-09-27 MED ORDER — FENTANYL CITRATE (PF) 100 MCG/2ML IJ SOLN
INTRAMUSCULAR | Status: AC
  Filled 2018-09-27: qty 2

## 2018-09-27 MED ORDER — PROPOFOL 10 MG/ML IV BOLUS
INTRAVENOUS | Status: AC
  Filled 2018-09-27: qty 20

## 2018-09-27 MED ORDER — PROPOFOL 10 MG/ML IV BOLUS
INTRAVENOUS | Status: DC | PRN
  Administered 2018-09-27: 120 mg via INTRAVENOUS

## 2018-09-27 MED ORDER — ONDANSETRON HCL 4 MG/2ML IJ SOLN
INTRAMUSCULAR | Status: AC
  Filled 2018-09-27: qty 2

## 2018-09-27 MED ORDER — CIPROFLOXACIN HCL 500 MG PO TABS: 500.0000 mg | ORAL_TABLET | Freq: Two times a day (BID) | ORAL | 0 refills | Status: AC

## 2018-09-27 MED ORDER — MIDAZOLAM HCL 5 MG/5ML IJ SOLN
INTRAMUSCULAR | Status: DC | PRN
  Administered 2018-09-27: 2 mg via INTRAVENOUS

## 2018-09-27 MED ORDER — MIDAZOLAM HCL 2 MG/2ML IJ SOLN: 0.5000 mg | Freq: Once | INTRAMUSCULAR | Status: DC | PRN

## 2018-09-27 MED ORDER — PROMETHAZINE HCL 25 MG/ML IJ SOLN: 6.2500 mg | INTRAMUSCULAR | Status: DC | PRN

## 2018-09-27 MED ORDER — LIDOCAINE 2% (20 MG/ML) 5 ML SYRINGE
INTRAMUSCULAR | Status: AC
  Filled 2018-09-27: qty 5

## 2018-09-27 MED ORDER — GENTAMICIN SULFATE 40 MG/ML IJ SOLN
5.0000 mg/kg | INTRAVENOUS | Status: AC
  Administered 2018-09-27: 330 mg via INTRAVENOUS
  Filled 2018-09-27: qty 8.25

## 2018-09-27 MED ORDER — LACTATED RINGERS IV SOLN
INTRAVENOUS | Status: DC
  Administered 2018-09-27 (×2): via INTRAVENOUS

## 2018-09-27 MED ORDER — OXYCODONE HCL 5 MG PO TABS
5.0000 mg | ORAL_TABLET | ORAL | Status: DC | PRN
  Administered 2018-09-27: 5 mg via ORAL

## 2018-09-27 MED ORDER — SODIUM CHLORIDE 0.9 % IV SOLN
INTRAVENOUS | Status: DC | PRN
  Administered 2018-09-27: 67 mL

## 2018-09-27 MED ORDER — FENTANYL CITRATE (PF) 100 MCG/2ML IJ SOLN
INTRAMUSCULAR | Status: DC | PRN
  Administered 2018-09-27 (×2): 25 ug via INTRAVENOUS
  Administered 2018-09-27: 50 ug via INTRAVENOUS

## 2018-09-27 MED ORDER — MEPERIDINE HCL 50 MG/ML IJ SOLN: 6.2500 mg | INTRAMUSCULAR | Status: DC | PRN

## 2018-09-27 MED ORDER — DEXAMETHASONE SODIUM PHOSPHATE 10 MG/ML IJ SOLN
INTRAMUSCULAR | Status: DC | PRN
  Administered 2018-09-27: 10 mg via INTRAVENOUS

## 2018-09-27 MED ORDER — EPHEDRINE SULFATE-NACL 50-0.9 MG/10ML-% IV SOSY
PREFILLED_SYRINGE | INTRAVENOUS | Status: DC | PRN
  Administered 2018-09-27 (×2): 10 mg via INTRAVENOUS

## 2018-09-27 MED ORDER — SODIUM CHLORIDE 0.9 % IR SOLN
Status: DC | PRN
  Administered 2018-09-27: 3000 mL

## 2018-09-27 MED ORDER — DEXAMETHASONE SODIUM PHOSPHATE 10 MG/ML IJ SOLN
INTRAMUSCULAR | Status: AC
  Filled 2018-09-27: qty 1

## 2018-09-27 MED ORDER — MIDAZOLAM HCL 2 MG/2ML IJ SOLN
INTRAMUSCULAR | Status: AC
  Filled 2018-09-27: qty 2

## 2018-09-27 MED ORDER — FENTANYL CITRATE (PF) 100 MCG/2ML IJ SOLN: 25.0000 ug | INTRAMUSCULAR | Status: DC | PRN

## 2018-09-27 MED ORDER — OXYCODONE HCL 5 MG PO TABS
ORAL_TABLET | ORAL | Status: AC
  Filled 2018-09-27: qty 1

## 2018-09-27 SURGICAL SUPPLY — 30 items
BAG URO CATCHER STRL LF (MISCELLANEOUS) ×3 IMPLANT
BASKET ZERO TIP NITINOL 2.4FR (BASKET) IMPLANT
CATH URET 5FR 28IN OPEN ENDED (CATHETERS) ×3 IMPLANT
CLOTH BEACON ORANGE TIMEOUT ST (SAFETY) ×3 IMPLANT
COVER SURGICAL LIGHT HANDLE (MISCELLANEOUS) ×3 IMPLANT
COVER WAND RF STERILE (DRAPES) ×3 IMPLANT
DRAPE SHEET LG 3/4 BI-LAMINATE (DRAPES) ×3 IMPLANT
EXTRACTOR STONE 1.7FRX115CM (UROLOGICAL SUPPLIES) ×3 IMPLANT
FIBER LASER FLEXIVA 1000 (UROLOGICAL SUPPLIES) IMPLANT
FIBER LASER FLEXIVA 365 (UROLOGICAL SUPPLIES) IMPLANT
FIBER LASER FLEXIVA 550 (UROLOGICAL SUPPLIES) IMPLANT
FIBER LASER TRAC TIP (UROLOGICAL SUPPLIES) ×3 IMPLANT
GLOVE BIOGEL M STRL SZ7.5 (GLOVE) ×3 IMPLANT
GLOVE BIOGEL PI IND STRL 7.0 (GLOVE) ×2 IMPLANT
GLOVE BIOGEL PI INDICATOR 7.0 (GLOVE) ×4
GOWN STRL REUS W/ TWL LRG LVL3 (GOWN DISPOSABLE) ×1 IMPLANT
GOWN STRL REUS W/TWL LRG LVL3 (GOWN DISPOSABLE) ×2
GOWN STRL REUS W/TWL XL LVL3 (GOWN DISPOSABLE) ×3 IMPLANT
GUIDEWIRE ANG ZIPWIRE 038X150 (WIRE) IMPLANT
GUIDEWIRE STR DUAL SENSOR (WIRE) ×6 IMPLANT
KIT BALLN UROMAX 15FX4 (MISCELLANEOUS) ×1 IMPLANT
KIT BALLN UROMAX 26 75X4 (MISCELLANEOUS) ×2
MANIFOLD NEPTUNE II (INSTRUMENTS) ×3 IMPLANT
PACK CYSTO (CUSTOM PROCEDURE TRAY) ×3 IMPLANT
SHEATH URETERAL 12FRX28CM (UROLOGICAL SUPPLIES) ×3 IMPLANT
SHEATH URETERAL 12FRX35CM (MISCELLANEOUS) IMPLANT
TUBING CONNECTING 10 (TUBING) ×2 IMPLANT
TUBING CONNECTING 10' (TUBING) ×1
TUBING UROLOGY SET (TUBING) ×3 IMPLANT
WIRE COONS/BENSON .038X145CM (WIRE) IMPLANT

## 2018-09-27 NOTE — Anesthesia Procedure Notes (Signed)
Procedure Name: LMA Insertion Date/Time: 09/27/2018 12:45 PM Performed by: Harwood Nall D, CRNA Pre-anesthesia Checklist: Patient identified, Emergency Drugs available, Suction available and Patient being monitored Patient Re-evaluated:Patient Re-evaluated prior to induction Oxygen Delivery Method: Circle system utilized Preoxygenation: Pre-oxygenation with 100% oxygen Induction Type: IV induction Ventilation: Mask ventilation without difficulty LMA: LMA inserted LMA Size: 3.0 Tube type: Oral Number of attempts: 1 Placement Confirmation: positive ETCO2 and breath sounds checked- equal and bilateral Tube secured with: Tape Dental Injury: Teeth and Oropharynx as per pre-operative assessment

## 2018-09-27 NOTE — Anesthesia Preprocedure Evaluation (Addendum)
Anesthesia Evaluation  Patient identified by MRN, date of birth, ID band Patient awake    Reviewed: Allergy & Precautions, NPO status , Patient's Chart, lab work & pertinent test results  History of Anesthesia Complications (+) PONV  Airway Mallampati: I  TM Distance: >3 FB Neck ROM: Full    Dental  (+) Edentulous Upper, Edentulous Lower   Pulmonary pneumonia (08/21/18 community acquired pneumonia, required supplemental O2), resolved,    breath sounds clear to auscultation       Cardiovascular hypertension, Pt. on medications (-) angina Rhythm:Regular Rate:Normal  '17 Nuclear stress EF: 68%, no ST segment deviation noted during stress, No T wave inversion was noted during stress, low risk study. The study is normal.  '17 ECHO: EF 60-65%, valves OK   Neuro/Psych Anxiety Depression ADD Essential tremor    GI/Hepatic Neg liver ROS, GERD  Medicated and Controlled,S/p gastric bypass   Endo/Other  negative endocrine ROS  Renal/GU ARFRenal disease     Musculoskeletal  (+) Arthritis , Osteoarthritis,  Fibromyalgia -scoliosis   Abdominal   Peds  Hematology  (+) Blood dyscrasia (Hb 9.9), anemia ,   Anesthesia Other Findings   Reproductive/Obstetrics                            Anesthesia Physical Anesthesia Plan  ASA: III  Anesthesia Plan: General   Post-op Pain Management:    Induction: Intravenous  PONV Risk Score and Plan: 4 or greater and Ondansetron, Dexamethasone and Treatment may vary due to age or medical condition  Airway Management Planned: Oral ETT  Additional Equipment:   Intra-op Plan:   Post-operative Plan: Extubation in OR  Informed Consent: I have reviewed the patients History and Physical, chart, labs and discussed the procedure including the risks, benefits and alternatives for the proposed anesthesia with the patient or authorized representative who has indicated  his/her understanding and acceptance.     Plan Discussed with: CRNA and Surgeon  Anesthesia Plan Comments: (Plan routine monitors, GETA)        Anesthesia Quick Evaluation

## 2018-09-27 NOTE — Discharge Instructions (Signed)
DISCHARGE INSTRUCTIONS FOR KIDNEY STONE/URETERAL STENT   MEDICATIONS:  1. Resume all your other meds from home - except do not take any extra narcotic pain meds that you may have at home.  2. Pyridium is to help with the burning/stinging when you urinate. 3. Oxycodone is for pain. 4. Complete 14 days of cipro.  ACTIVITY:  1. No strenuous activity x 1week  2. No driving while on narcotic pain medications  3. Drink plenty of water  4. Continue to walk at home - you can still get blood clots when you are at home, so keep active, but don't over do it.  5. May return to work/school tomorrow or when you feel ready   BATHING:  1. You can shower and we recommend daily showers  2. You have a string coming from your urethra: The stent string is attached to your ureteral stent. Do not pull on this.   SIGNS/SYMPTOMS TO CALL:  Please call us if you have a fever greater than 101.5, uncontrolled nausea/vomiting, uncontrolled pain, dizziness, unable to urinate, bloody urine, chest pain, shortness of breath, leg swelling, leg pain, redness around wound, drainage from wound, or any other concerns or questions.   You can reach us at [phone].   FOLLOW-UP:  1. We will contact you with follow-up date for second surgery.

## 2018-09-27 NOTE — Telephone Encounter (Signed)
Verbal orders given to patient. 

## 2018-09-27 NOTE — H&P (Signed)
f/u for obstructing stone  HPI: [name] is a 72 year-old female established patient who is here for further eval and management of an obstructing stone.  The patient's stone is on her left side. The stone was 7mm left proximal ureter.   The patient has not passed their stone since her visit. The patient denies fevers, chills, nausea, vomiting, flank pain, groin pain, or progressive voiding symptoms.   CT abdominal and pelvis 08/21/18 showing 6 mm stone at left ureteropelvic junction. Patient here today for discussion on treatment for UPJ stone and management of nephrostomy tube. [facility]18 for pneumonia, sepsis due to Escherichia coli- left hydronephrosis, and UPJ stone. No history of kidney stones prior to episode. Patient currently residing at [facility] until treated.      ALLERGIES: None   MEDICATIONS: Adderall 20 mg tablet  Amitriptyline Hcl  Effexor Xr 75 mg capsule, ext release 24 hr  Oxycodone Hcl 15 mg tablet     GU PSH: Hysterectomy, 1990    NON-GU PSH: Gastric bypass, 2005    GU PMH: None   NON-GU PMH: Anxiety Arthritis    FAMILY HISTORY: 1 Daughter - Daughter 1 son - Son Heart Attack - Mother   SOCIAL HISTORY: Marital Status: Married Preferred Language: English; Ethnicity: Not Hispanic Or Latino; Race: White Current Smoking Status: Patient has never smoked.   Tobacco Use Assessment Completed: Used Tobacco in last 30 days? Has never drank.  Drinks 4+ caffeinated drinks per day. Patient's occupation is/was Retired.    REVIEW OF SYSTEMS:    GU Review Female:   Patient reports leakage of urine. Patient denies frequent urination, hard to postpone urination, burning /pain with urination, get up at night to urinate, stream starts and stops, trouble starting your stream, have to strain to urinate, and being pregnant.  Gastrointestinal (Upper):   Patient denies nausea, vomiting, and indigestion/ heartburn.  Gastrointestinal  (Lower):   Patient reports constipation. Patient denies diarrhea.  Constitutional:   Patient reports weight loss and fatigue. Patient denies night sweats and fever.  Skin:   Patient denies skin rash/ lesion and itching.  Eyes:   Patient denies blurred vision and double vision.  Ears/ Nose/ Throat:   Patient denies sore throat and sinus problems.  Hematologic/Lymphatic:   Patient denies swollen glands and easy bruising.  Cardiovascular:   Patient reports leg swelling. Patient denies chest pains.  Respiratory:   Patient denies cough and shortness of breath.  Endocrine:   Patient denies excessive thirst.  Musculoskeletal:   Patient reports back pain and joint pain.   Neurological:   Patient denies headaches and dizziness.  Psychologic:   Patient reports anxiety. Patient denies depression.   VITAL SIGNS:      09/12/2018 03:41 PM  Weight 120 lb / 54.43 kg  BP 126/75 mmHg  Pulse 81 /min  Temperature 97.0 F / 36.1 C   MULTI-SYSTEM PHYSICAL EXAMINATION:    Constitutional: Well-nourished. No physical deformities. Normally developed. Good grooming.  Respiratory: Normal breath sounds. No labored breathing, no use of accessory muscles.   Cardiovascular: Regular rate and rhythm. No murmur, no gallop. Normal temperature, normal extremity pulses, no swelling, no varicosities.   Gastrointestinal: No mass, no tenderness, no rigidity, non obese abdomen. The patient's nephrostomy tube site appears to be in the appropriate position draining. All urine. There is a hematoma that is quite superficial on the patient, but is otherwise she is asymptomatic.     PAST DATA REVIEWED:  Source  Of History:  Patient  Records Review:   Previous Doctor Records, Previous Hospital Records, Previous Patient Records  X-Ray Review: C.T. Abdomen/Pelvis: Reviewed Films. Discussed With Patient.     PROCEDURES: None   ASSESSMENT:      ICD-10 Details  1 GU:   Ureteral calculus - N20.1    PLAN:           Orders Labs  Urine Culture          Document Letter(s):  Created for Patient: Clinical Summary         Notes:   The patient has a left-sided proximal ureteral stone that contributed to her bacterial sepsis. A nephrostomy tube was placed. She presents today for further management having recovered nearly back to her baseline.   I discussed the treatment options with the patient and recommended that she undergo ureteroscopy. I described the surgery for the patient including the postoperative stent placement. We will also try to remove her nephrostomy tube at the time of surgery.

## 2018-09-27 NOTE — Op Note (Signed)
Preoperative diagnosis:  1. Septic left ureteral stone   Postoperative diagnosis:  1. Same 2. Left UPJ/proximal ureteral stricture   Procedure: 1. Cysto, left retrograde pyelogram 2. Left ureteroscopy, laser lithotripsy 3. Left anterograde pyelogram 4. Attempted left ureteral balloon dilation  Surgeon: Crist FatBenjamin W. Herrick, MD  Anesthesia: General  Complications: None  Intraoperative findings:  #1: The patient's retrograde pyelogram was performed using 10 cc of Omnipaque contrast.  This demonstrated a widely patent ureter distally all the way up to the proximal ureter.  There was medial deviation of the ureter.  There was tortuosity of the proximal ureter.  The filling defect was noted within the kink of the proximal ureter.  There was some hydronephrosis of the upper tract. #2: Ureteroscopy demonstrated a scarred or strictured segment in the proximal ureter that was tortuous.  Initially, I was unable to get a wire across the UVJ stricture.  I was ultimately able to get a wire across but could not get the balloon dilator across the segment because of how tight the stenosis was.  Ultimately, I was unable to dilate the ureter. #3: I was able to get to the stone and lasered it into smaller fragments.  However, I did not basket the stones because of the strictured ureter, as I was unable to pass a even a small stone across it. #4: I attempted to pass the strictured segment of the proximal ureter antegrade through the nephrostomy tube.  However, the curl of the nephrostomy tube prevented me from being able to guide the wire down the ureter.  I did perform a antegrade pyelogram which demonstrated no significant collecting system abnormalities.  There was sharp calyces.  The patient appeared to have a bifid collecting system.  EBL: Minimal  Specimens: None  Indication: [name] is a 72 y.o. patient with history of septic shock and a 6 mm proximal ureteral stone.  A nephrostomy tube was  placed and she was given a long course of antibiotics.  Her hospitalization was protracted because of pneumonia as well.  Her recovery consisted of  A skilled nursing facility stay.  After reviewing the management options for treatment, he elected to proceed with the above surgical procedure(s). We have discussed the potential benefits and risks of the procedure, side effects of the proposed treatment, the likelihood of the patient achieving the goals of the procedure, and any potential problems that might occur during the procedure or recuperation. Informed consent has been obtained.  Description of procedure:  The patient was taken to the operating room and general anesthesia was induced.  The patient was placed in the dorsal lithotomy position, prepped and draped in the usual sterile fashion, and preoperative antibiotics were administered. A preoperative time-out was performed.   21 French 30 degrees cystoscope was gently passed through the patient's urethra and into the bladder.  Cystoscopy demonstrated prolapsed bladder, orthotopic ureteral orifice ease, and no significant bladder abnormalities.  I cannulated the patient's left ureteral orifice with a 5 [place]French open-ended catheter and injected contrast through the open-ended catheter performing a retrograde pyelogram with the above findings.  I then advanced the wire up through the open-ended catheter and into the proximal left ureter.  Again, I was unable to get the wire beyond the UPJ stricture.  I then exchanged the cystoscope for a rigid ureteroscope and advanced it up to the proximal ureter.  However, I was unable to navigate beyond the tortuous and stenosed area.  I advanced the wire through the scope  which then curled in the proximal ureter just distal to the UPJ.  I then advanced a 25 cm ureteral access sheath to the proximal ureter removing the inner portion.  Flexible ureteroscopy demonstrated a stenosed proximal ureter that turned lateral and  caudal before turning cephalad up to the UPJ.  This area was scarred and very difficult to navigate.  I was able to get beyond the medial and more distal stricture by gentle pressure with the head of the scope.  I encountered the stone in this area and lasered into small fragments.  I was unable to locate the true lumen of the ureter more proximal up through the UPJ.  At this point I opted to attempt a antegrade approach and prepped the patient's nephrostomy tube.  A sterile field was created.  I took the cap off of the nephrostomy tube and performed a antegrade nephrostogram by instilling contrast into the tube.  The above findings were noted.  I then attempted to wire the nephrostomy tube but I was unable to get the wire to curl in the renal pelvis.  I did not want to remove the patient's nephrostomy tube because she was not positioned appropriately on the table and anesthesia was concerned that we may have to put in an endotracheal tube to perform the maneuvers that I was hoping to achieve.  As such, I opted to abandon this approach.  Instead, I went back down to the pelvis and with the ureteroscope was able to get up into the strictured segment and ultimately get a wire across the UPJ segment.  I then backed out the scope and attempted to advance a balloon dilator over the wire.  However, I was unable to get the balloon dilator across the strictured segment of the UPJ.  Attempting to do so the wire slipped out.  At this point it became clear to me that I was not going to be able to dilate the patient's ureter in this fashion and that an antegrade approach would be necessary.  I opted to remove the wires and access sheath and uncap the nephrostomy tube.  At this point, the case was terminated.  The patient tolerated this procedure quite well.  She was returned to the PACU in stable condition.  Disposition: The patient will be scheduled for antegrade ureteroscopy in the coming weeks.  Crist Fat,  M.D.

## 2018-09-27 NOTE — Transfer of Care (Signed)
Immediate Anesthesia Transfer of Care Note  Patient: [name]  Procedure(s) Performed: LEFT  URETEROSCOPY, HOLMIUM LASER LITHOTRIPSY (Left )  Patient Location: PACU  Anesthesia Type:General  Level of Consciousness: awake, alert  and oriented  Airway & Oxygen Therapy: Patient Spontanous Breathing and Patient connected to face mask oxygen  Post-op Assessment: Report given to RN and Post -op Vital signs reviewed and stable  Post vital signs: Reviewed and stable  Last Vitals:  Vitals Value Taken Time  BP    Temp    Pulse    Resp    SpO2      Last Pain:  Vitals:   09/27/18 1053  TempSrc: Oral         Complications: No apparent anesthesia complications

## 2018-09-27 NOTE — Interval H&P Note (Signed)
History and Physical Interval Note:  09/27/2018 11:50 AM  [name]  has presented today for surgery, with the diagnosis of LEFT URETERAL STONE  The various methods of treatment have been discussed with the patient and family. After consideration of risks, benefits and other options for treatment, the patient has consented to  Procedure(s): LEFT  URETEROSCOPY LASER LITHOTRIPSY AND STENT PLACEMENT (Left) HOLMIUM LASER APPLICATION (Left) as a surgical intervention .  The patient's history has been reviewed, patient examined, no change in status, stable for surgery.  I have reviewed the patient's chart and labs.  Questions were answered to the patient's satisfaction.     Berniece SalinesHERRICK, Liba Hulsey W

## 2018-09-27 NOTE — Anesthesia Postprocedure Evaluation (Signed)
Anesthesia Post Note  Patient: Molly HawthorneRita C Fischer  Procedure(s) Performed: LEFT  URETEROSCOPY, HOLMIUM LASER LITHOTRIPSY (Left )     Patient location during evaluation: PACU Anesthesia Type: General Level of consciousness: awake and alert Pain management: pain level controlled Vital Signs Assessment: post-procedure vital signs reviewed and stable Respiratory status: spontaneous breathing, nonlabored ventilation and respiratory function stable Cardiovascular status: blood pressure returned to baseline and stable Postop Assessment: no apparent nausea or vomiting Anesthetic complications: no    Last Vitals:  Vitals:   09/27/18 1500 09/27/18 1515  BP: 115/70 106/64  Pulse: 76 73  Resp: 14 13  Temp:    SpO2: 98% 100%    Last Pain:  Vitals:   09/27/18 1515  TempSrc:   PainSc: 3                  Lucretia Kernarolyn E Glendy Barsanti

## 2018-09-28 ENCOUNTER — Telehealth: Payer: Self-pay | Admitting: *Deleted

## 2018-09-28 ENCOUNTER — Encounter (HOSPITAL_COMMUNITY): Payer: Self-pay | Admitting: Urology

## 2018-09-28 NOTE — Telephone Encounter (Signed)
Received Physician Orders from [facility]; forwarded to provider/SLS 11/21

## 2018-09-29 ENCOUNTER — Telehealth: Payer: Self-pay | Admitting: *Deleted

## 2018-09-29 NOTE — Telephone Encounter (Signed)
Follow up call made to patient. States she is feeling better and her temp has come down to 98.4. Patient advised to be seen in office, UC or tomorrow during Saturday clinic. Patient refused states she will be seeing her surgeon next week. Advised her to call back or go to ED if her condition changed. Patient agreed.

## 2018-09-29 NOTE — Telephone Encounter (Signed)
Received Home Health Certification and Plan of Care; forwarded to provider/SLS 11/22

## 2018-10-03 ENCOUNTER — Other Ambulatory Visit: Payer: Self-pay | Admitting: Family Medicine

## 2018-10-03 DIAGNOSIS — F988 Other specified behavioral and emotional disorders with onset usually occurring in childhood and adolescence: Secondary | ICD-10-CM

## 2018-10-03 MED ORDER — CLONAZEPAM 0.5 MG PO TABS: 0.1250 mg | ORAL_TABLET | Freq: Every day | ORAL | 1 refills | Status: DC | PRN

## 2018-10-03 MED ORDER — AMPHETAMINE-DEXTROAMPHET ER 20 MG PO CP24: ORAL_CAPSULE | ORAL | 0 refills | Status: DC

## 2018-10-03 NOTE — Telephone Encounter (Signed)
Pt is requesting refill on clonazepam and Adderall.   Last OV: 07/13/2018 Last Fill on clonazepam: Last date not noted Last Fill on Adderall: 08/11/2018 #60 and 0RF UDS: 02/07/2018 Low risk CSC: 02/07/2018

## 2018-10-03 NOTE — Telephone Encounter (Signed)
Copied from CRM [id number]. Topic: Quick Communication - Rx Refill/Question >> Oct 03, 2018  9:17 AM Arlyss Gandyichardson, Lenah Messenger N, NT wrote: Medication: amphetamine-dextroamphetamine (ADDERALL XR) 20 MG 24 hr capsule and clonazePAM (KLONOPIN) 0.5 MG tablet   Has the patient contacted their pharmacy? No- didn't get to ask before pts phone disconnected   (Agent: If no, request that the patient contact the pharmacy for the refill.) (Agent: If yes, when and what did the pharmacy advise?)  Preferred Pharmacy (with phone number or street name): [facility] - [place], [place] - [address] [phone] (Phone) [phone])    Agent: Please be advised that RX refills may take up to 3 business days. We ask that you follow-up with your pharmacy.

## 2018-10-03 NOTE — Telephone Encounter (Signed)
Please make sure last ov, last fill, uds . Contract and database are in the refill request

## 2018-10-04 ENCOUNTER — Telehealth: Payer: Self-pay | Admitting: *Deleted

## 2018-10-04 NOTE — Telephone Encounter (Signed)
Received Physician Orders from [facility]; forwarded to provider/SLS 11/27

## 2018-10-10 ENCOUNTER — Other Ambulatory Visit: Payer: Self-pay | Admitting: Urology

## 2018-10-16 ENCOUNTER — Encounter: Payer: Self-pay | Admitting: Internal Medicine

## 2018-10-16 ENCOUNTER — Ambulatory Visit (HOSPITAL_BASED_OUTPATIENT_CLINIC_OR_DEPARTMENT_OTHER)
Admission: RE | Admit: 2018-10-16 | Discharge: 2018-10-16 | Disposition: A | Discharge status: Home / Self Care | Payer: Medicare Other | Source: Ambulatory Visit | Attending: Internal Medicine | Admitting: Internal Medicine

## 2018-10-16 ENCOUNTER — Ambulatory Visit: Payer: Self-pay

## 2018-10-16 ENCOUNTER — Ambulatory Visit: Payer: Medicare Other | Admitting: Family Medicine

## 2018-10-16 ENCOUNTER — Ambulatory Visit: Payer: Medicare Other | Admitting: Internal Medicine

## 2018-10-16 VITALS — BP 142/80 | HR 70 | Temp 98.2°F | Resp 16 | Ht 65.0 in | Wt 128.5 lb

## 2018-10-16 DIAGNOSIS — M4856XA Collapsed vertebra, not elsewhere classified, lumbar region, initial encounter for fracture: Secondary | ICD-10-CM | POA: Diagnosis not present

## 2018-10-16 DIAGNOSIS — M25552 Pain in left hip: Secondary | ICD-10-CM | POA: Diagnosis present

## 2018-10-16 DIAGNOSIS — M8580 Other specified disorders of bone density and structure, unspecified site: Secondary | ICD-10-CM | POA: Insufficient documentation

## 2018-10-16 DIAGNOSIS — Z0289 Encounter for other administrative examinations: Secondary | ICD-10-CM

## 2018-10-16 DIAGNOSIS — W19XXXA Unspecified fall, initial encounter: Secondary | ICD-10-CM

## 2018-10-16 NOTE — Patient Instructions (Signed)
Get an x-ray of your left hip  Fall Prevention in the Home Falls can cause injuries and can affect people from all age groups. There are many simple things that you can do to make your home safe and to help prevent falls. What can I do on the outside of my home?  Regularly repair the edges of walkways and driveways and fix any cracks.  Remove high doorway thresholds.  Trim any shrubbery on the main path into your home.  Use bright outdoor lighting.  Clear walkways of debris and clutter, including tools and rocks.  Regularly check that handrails are securely fastened and in good repair. Both sides of any steps should have handrails.  Install guardrails along the edges of any raised decks or porches.  Have leaves, snow, and ice cleared regularly.  Use sand or salt on walkways during winter months.  In the garage, clean up any spills right away, including grease or oil spills. What can I do in the bathroom?  Use night lights.  Install grab bars by the toilet and in the tub and shower. Do not use towel bars as grab bars.  Use non-skid mats or decals on the floor of the tub or shower.  If you need to sit down while you are in the shower, use a plastic, non-slip stool.  Keep the floor dry. Immediately clean up any water that spills on the floor.  Remove soap buildup in the tub or shower on a regular basis.  Attach bath mats securely with double-sided non-slip rug tape.  Remove throw rugs and other tripping hazards from the floor. What can I do in the bedroom?  Use night lights.  Make sure that a bedside light is easy to reach.  Do not use oversized bedding that drapes onto the floor.  Have a firm chair that has side arms to use for getting dressed.  Remove throw rugs and other tripping hazards from the floor. What can I do in the kitchen?  Clean up any spills right away.  Avoid walking on wet floors.  Place frequently used items in easy-to-reach places.  If you  need to reach for something above you, use a sturdy step stool that has a grab bar.  Keep electrical cables out of the way.  Do not use floor polish or wax that makes floors slippery. If you have to use wax, make sure that it is non-skid floor wax.  Remove throw rugs and other tripping hazards from the floor. What can I do in the stairways?  Do not leave any items on the stairs.  Make sure that there are handrails on both sides of the stairs. Fix handrails that are broken or loose. Make sure that handrails are as long as the stairways.  Check any carpeting to make sure that it is firmly attached to the stairs. Fix any carpet that is loose or worn.  Avoid having throw rugs at the top or bottom of stairways, or secure the rugs with carpet tape to prevent them from moving.  Make sure that you have a light switch at the top of the stairs and the bottom of the stairs. If you do not have them, have them installed. What are some other fall prevention tips?  Wear closed-toe shoes that fit well and support your feet. Wear shoes that have rubber soles or low heels.  When you use a stepladder, make sure that it is completely opened and that the sides are firmly locked. Have  someone hold the ladder while you are using it. Do not climb a closed stepladder.  Add color or contrast paint or tape to grab bars and handrails in your home. Place contrasting color strips on the first and last steps.  Use mobility aids as needed, such as canes, walkers, scooters, and crutches.  Turn on lights if it is dark. Replace any light bulbs that burn out.  Set up furniture so that there are clear paths. Keep the furniture in the same spot.  Fix any uneven floor surfaces.  Choose a carpet design that does not hide the edge of steps of a stairway.  Be aware of any and all pets.  Review your medicines with your healthcare provider. Some medicines can cause dizziness or changes in blood pressure, which increase  your risk of falling. Talk with your health care provider about other ways that you can decrease your risk of falls. This may include working with a physical therapist or trainer to improve your strength, balance, and endurance. This information is not intended to replace advice given to you by your health care provider. Make sure you discuss any questions you have with your health care provider. Document Released: 10/15/2002 Document Revised: 03/23/2016 Document Reviewed: 11/29/2014 Elsevier Interactive Patient Education  [employer].

## 2018-10-16 NOTE — Progress Notes (Signed)
Subjective:    Patient ID: [name], female    DOB: 1946/09/03, 72 y.o.   MRN: [id number]  DOS:  10/16/2018 Type of visit - description : Acute  Patient is here with her husband. This morning at home she slipped and fell on the left side.  She bumped her head, shoulder and hip on the L side. She had chronic MSK problems, all of them seem at baseline, maybe the left hip is slightly more painful than an average day.   Review of Systems No LOC. No headache, nausea, vomiting. Denies neck pain.  Past Medical History:  Diagnosis Date  . ADD (attention deficit disorder) 05/24/2012  . ANEMIA, MILD 06/26/2008   Qualifier: Diagnosis of  By: Janit Bern    . Anxiety   . Arthritis    "joints, back" (10/02/2013)  . Chronic back pain    "all over my back" (10/02/2013)  . Chronic kidney disease   . Congestive heart disease (HCC)    "I have the beginning signs" (10/02/2013)  . Crushing injury of arm, right 02/06/1989's   "it was crushed; wore cast from fingers to top of my shoulder" (11/25/2  . Crushing injury of left wrist 05/11/1989's  . DIZZINESS 12/31/2009   Qualifier: Diagnosis of  By: Oswald Hillock    . Dyspnea    with exertion   . Fibromyalgia   . GERD 12/03/2008   Qualifier: Diagnosis of  By: Freddy Jaksch    . HYPERLIPIDEMIA 05/15/2007   Qualifier: Diagnosis of  By: Janit Bern ; pt denies this hx on 10/02/2013  . Hypertension   . Idiopathic scoliosis 04/19/2013  . Migraines    "once/month" (10/02/2013)  . Osteoporosis, unspecified 04/19/2013  . Other vitamin B12 deficiency anemia 10/30/2009   Qualifier: Diagnosis of  By: Floydene Flock    . PALPITATIONS, RECURRENT 12/12/2009   Qualifier: Diagnosis of  By: Janit Bern    . Pica 12/28/2007   Qualifier: Diagnosis of  By: Janit Bern    . Pneumonia 10/02/2013   "had onsets of it before; not full blown til now" (10/02/2013)  . PONV (postoperative nausea and vomiting)   . PSTPRC STATUS, BARIATRIC SURGERY  05/15/2007   Qualifier: Diagnosis of  By: Janit Bern    . Pulmonary nodules/lesions, multiple   . TREMOR, ESSENTIAL 12/12/2009   Qualifier: Diagnosis of  By: Janit Bern    . Wears glasses     Past Surgical History:  Procedure Laterality Date  . ABDOMINAL HYSTERECTOMY  1980's  . APPENDECTOMY  1980's   "w/either my hysterectomy or myomectomy" (10/02/2013)  . BACK SURGERY    . CLOSED REDUCTION HAND FRACTURE Right 1990's   "it was crushed; wore cast from fingers to top of my shoulder" (10/02/2013)  . CYSTOSCOPY WITH URETEROSCOPY AND STENT PLACEMENT Left 09/27/2018   Procedure: LEFT  URETEROSCOPY, HOLMIUM LASER LITHOTRIPSY;  Surgeon: Crist Fat, MD;  Location: [facility];  Service: Urology;  Laterality: Left;  [name] GASTRIC BYPASS  ~ 2003  . IR NEPHROSTOMY PLACEMENT LEFT  08/22/2018  . KNEE ARTHROSCOPY Right 2002  . MYOMECTOMY  1980's  . REDUCTION MAMMAPLASTY Bilateral 2001  . REFRACTIVE SURGERY Bilateral 2004  . SPINAL CORD STIMULATOR IMPLANT     patient reports remote control is missing has not used in greater than a year per patient on 09/26/2018   . TONSILLECTOMY AND ADENOIDECTOMY  1955  . TUBAL LIGATION  1980's    Social History  Socioeconomic History  . Marital status: Married    Spouse name: Not on file  . Number of children: 2  . Years of education: Not on file  . Highest education level: Not on file  Occupational History  . Occupation: Retired  Engineer, productionocial Needs  . Financial resource strain: Not on file  . Food insecurity:    Worry: Not on file    Inability: Not on file  . Transportation needs:    Medical: Not on file    Non-medical: Not on file  Tobacco Use  . Smoking status: Never Smoker  . Smokeless tobacco: Never Used  Substance and Sexual Activity  . Alcohol use: No  . Drug use: No  . Sexual activity: Never    Partners: Male  Lifestyle  . Physical activity:    Days per week: Not on file    Minutes per session: Not on file  . Stress: Not on  file  Relationships  . Social connections:    Talks on phone: Not on file    Gets together: Not on file    Attends religious service: Not on file    Active member of club or organization: Not on file    Attends meetings of clubs or organizations: Not on file    Relationship status: Not on file  . Intimate partner violence:    Fear of current or ex partner: Not on file    Emotionally abused: Not on file    Physically abused: Not on file    Forced sexual activity: Not on file  Other Topics Concern  . Not on file  Social History Narrative  . Not on file      Allergies as of 10/16/2018   No Known Allergies     Medication List        Accurate as of 10/16/18 11:59 PM. Always use your most recent med list.          amitriptyline 75 MG tablet Commonly known as:  ELAVIL Take 75 mg by mouth at bedtime.   amphetamine-dextroamphetamine 20 MG 24 hr capsule Commonly known as:  ADDERALL XR 2 po qam   clonazePAM 0.5 MG tablet Commonly known as:  KLONOPIN Take 0.5 tablets (0.25 mg total) by mouth daily as needed for anxiety.   famotidine 20 MG tablet Commonly known as:  PEPCID Take 1 tablet (20 mg total) by mouth daily.   feeding supplement (ENSURE ENLIVE) Liqd Take 237 mLs by mouth 3 (three) times daily between meals.   ibuprofen 200 MG tablet Commonly known as:  ADVIL,MOTRIN Take 600 mg by mouth every 6 (six) hours as needed for headache or moderate pain.   neomycin-bacitracin-polymyxin ointment Commonly known as:  NEOSPORIN Apply 1 application topically daily as needed for wound care.   oxyCODONE 15 MG immediate release tablet Commonly known as:  ROXICODONE Take 15 mg by mouth 3 (three) times daily.   SUMAtriptan 50 MG tablet Commonly known as:  IMITREX Take 1 tablet (50 mg total) by mouth every 2 (two) hours as needed for migraine. May repeat in 2 hours if headache persists or recurs.   venlafaxine XR 75 MG 24 hr capsule Commonly known as:  EFFEXOR-XR Take 2  capsules (150 mg total) by mouth daily.           Objective:   Physical Exam BP (!) 142/80 (BP Location: Left Arm, Patient Position: Sitting, Cuff Size: Small)   Pulse 70   Temp 98.2 F (36.8 C) (Oral)  Resp 16   Ht 5\' 5"  (1.651 m)   Wt 128 lb 8 oz (58.3 kg)   SpO2 95%   BMI 21.38 kg/m  General:   Well developed, looks chronically ill but nontoxic.  Sits in a wheelchair.  With my assistant she was able to walk few steps. HEENT:  Normocephalic . Face symmetric, atraumatic Neck: Full range of motion. MSK: Moves her arms in all directions without major shoulder problems and no pain Neurologic:  alert & oriented X3.  Speech normal, gait very limited, see above. She was able to move her hip without major problems while she was sitting in the chair.  Palpation of the left hip is negative. Psych--  Cognition and judgment appear intact.  Cooperative with normal attention span and concentration.  Behavior appropriate. No anxious or depressed appearing.      Assessment & Plan:   72 y/o  Female; PMH includes HTN, hyperlipidemia, edema, very debilitated since an admission to the hospital in October due to sepsis secondary to a kidney stone.  Presents with: Mechanical fall, hip injury: Fall as described above, no LOC, no obvious neck or head injury. She is able to move her upper extremities without any problem, doubt a shoulder injury. Hips  seems okay but exam is limited thus will get a x-ray. If x-ray is negative then I will doubt any major injuries so will recommend continue routine medicines and fall prevention which she is doing, has PT that visits her at home.

## 2018-10-16 NOTE — Progress Notes (Signed)
Pre visit review using our clinic review tool, if applicable. No additional management support is needed unless otherwise documented below in the visit note. 

## 2018-10-16 NOTE — Telephone Encounter (Signed)
Pt physical therapist called to say that [name] had had a fall today and now has back and shoulder pain. [name] is present and gave verbal permission for me to speak with Tammy her physical therapist. She rates the pain at 5 on the pain scale. Tammy the Physical therapist states that the patient slipped on a pad on the floor in the bathroom She can find no evidence of head injury. Pt has C/O low back and shoulder pain. Per patient she was at the office today for a scheduled F/U appointment and it was rescheduled. because of time. Pt did not inform them of her fall at that time. Appointment scheduled today per protocol. Care advice read to patient and physical therapist Tammy. They both verbalized understanding of all instructions.   Reason for Disposition . Taking a medicine that could cause weakness (e.g., blood pressure medications, diuretics)  Answer Assessment - Initial Assessment Questions 1. DESCRIPTION: "Describe how you are feeling."     Weak fell today low back pain 2. SEVERITY: "How bad is it?"  "Can you stand and walk?"   - MILD - Feels weak or tired, but does not interfere with work, school or normal activities   - MODERATE - Able to stand and walk; weakness interferes with work, school, or normal activities   - SEVERE - Unable to stand or walk     5/10  shoulder and back 3. ONSET:  "When did the weakness begin?"     Tripped and fell on towel 4. CAUSE: "What do you think is causing the weakness?"     Was not because of weakness 5. MEDICINES: "Have you recently started a new medicine or had a change in the amount of a medicine?"     surgeon took off of morphine 6. OTHER SYMPTOMS: "Do you have any other symptoms?" (e.g., chest pain, fever, cough, SOB, vomiting, diarrhea, bleeding, other areas of pain)   no 7. PREGNANCY: "Is there any chance you are pregnant?" "When was your last menstrual period?"     N/A  Protocols used: WEAKNESS (GENERALIZED) AND FATIGUE-A-AH

## 2018-10-18 ENCOUNTER — Telehealth: Payer: Self-pay | Admitting: Family Medicine

## 2018-10-18 NOTE — Telephone Encounter (Signed)
Copied from CRM [phone]. Topic: General - Other >> Oct 18, 2018 11:20 AM Gerrianne ScalePayne, Colburn Asper L wrote: Reason for CRM: [facility] [phone] calling to report that pt fell off of toilet on Monday night with no injuries she the patient think that her leg had fell a sleep

## 2018-10-18 NOTE — Telephone Encounter (Signed)
Pt already seen on 10/16/2018.

## 2018-10-18 NOTE — Patient Instructions (Addendum)
[name]  1946/06/01    Your procedure is scheduled on:  10-20-2018   Report to [facility] Main  Entrance, Report to admitting at  7:00 AM    Call this number if you have problems the morning of surgery [phone]       Remember: Do not eat food or drink liquids :After Midnight.                                       BRUSH YOUR TEETH MORNING OF SURGERY AND RINSE YOUR MOUTH OUT, NO CHEWING GUM CANDY OR MINTS.     Take these medicines the morning of surgery with A SIP OF WATER:  Venlafaxine (effexor), Omeprazole (prilosec), Imitrex if needed                                  You may not have any metal on your body including hair pins and               piercings  Do not wear jewelry, make-up, lotions, powders or perfumes, deodorant              Do not wear nail polish.  Do not shave  48 hours prior to surgery.                   Do not bring valuables to the hospital. [facility] IS NOT             RESPONSIBLE   FOR VALUABLES.  Contacts, dentures or bridgework may not be worn into surgery.  Leave suitcase in the car. After surgery it may be brought to your room.      _____________________________________________________________________             [facility] - Preparing for Surgery Before surgery, you can play an important role.  Because skin is not sterile, your skin needs to be as free of germs as possible.  You can reduce the number of germs on your skin by washing with CHG (chlorahexidine gluconate) soap before surgery.  CHG is an antiseptic cleaner which kills germs and bonds with the skin to continue killing germs even after washing. Please DO NOT use if you have an allergy to CHG or antibacterial soaps.  If your skin becomes reddened/irritated stop using the CHG and inform your nurse when you arrive at Short Stay. Do not shave (including legs and underarms) for at least 48 hours prior to the first CHG shower.  You may shave your  face/neck. Please follow these instructions carefully:  1.  Shower with CHG Soap the night before surgery and the  morning of Surgery.  2.  If you choose to wash your hair, wash your hair first as usual with your  normal  shampoo.  3.  After you shampoo, rinse your hair and body thoroughly to remove the  shampoo.                            4.  Use CHG as you would any other liquid soap.  You can apply chg directly  to the skin and wash  Gently with a scrungie or clean washcloth.  5.  Apply the CHG Soap to your body ONLY FROM THE NECK DOWN.   Do not use on face/ open                           Wound or open sores. Avoid contact with eyes, ears mouth and genitals (private parts).                       Wash face,  Genitals (private parts) with your normal soap.             6.  Wash thoroughly, paying special attention to the area where your surgery  will be performed.  7.  Thoroughly rinse your body with warm water from the neck down.  8.  DO NOT shower/wash with your normal soap after using and rinsing off  the CHG Soap.             9.  Pat yourself dry with a clean towel.            10.  Wear clean pajamas.            11.  Place clean sheets on your bed the night of your first shower and do not  sleep with pets. Day of Surgery : Do not apply any lotions/deodorants the morning of surgery.  Please wear clean clothes to the hospital/surgery center.  FAILURE TO FOLLOW THESE INSTRUCTIONS MAY RESULT IN THE CANCELLATION OF YOUR SURGERY PATIENT SIGNATURE_________________________________  NURSE SIGNATURE__________________________________  ________________________________________________________________________  WHAT IS A BLOOD TRANSFUSION? Blood Transfusion Information  A transfusion is the replacement of blood or some of its parts. Blood is made up of multiple cells which provide different functions.  Red blood cells carry oxygen and are used for blood loss  replacement.  White blood cells fight against infection.  Platelets control bleeding.  Plasma helps clot blood.  Other blood products are available for specialized needs, such as hemophilia or other clotting disorders. BEFORE THE TRANSFUSION  Who gives blood for transfusions?   Healthy volunteers who are fully evaluated to make sure their blood is safe. This is blood bank blood. Transfusion therapy is the safest it has ever been in the practice of medicine. Before blood is taken from a donor, a complete history is taken to make sure that person has no history of diseases nor engages in risky social behavior (examples are intravenous drug use or sexual activity with multiple partners). The donor's travel history is screened to minimize risk of transmitting infections, such as malaria. The donated blood is tested for signs of infectious diseases, such as HIV and hepatitis. The blood is then tested to be sure it is compatible with you in order to minimize the chance of a transfusion reaction. If you or a relative donates blood, this is often done in anticipation of surgery and is not appropriate for emergency situations. It takes many days to process the donated blood. RISKS AND COMPLICATIONS Although transfusion therapy is very safe and saves many lives, the main dangers of transfusion include:   Getting an infectious disease.  Developing a transfusion reaction. This is an allergic reaction to something in the blood you were given. Every precaution is taken to prevent this. The decision to have a blood transfusion has been considered carefully by your caregiver before blood is given. Blood is not given unless the benefits outweigh the risks. AFTER  THE TRANSFUSION  Right after receiving a blood transfusion, you will usually feel much better and more energetic. This is especially true if your red blood cells have gotten low (anemic). The transfusion raises the level of the red blood cells which  carry oxygen, and this usually causes an energy increase.  The nurse administering the transfusion will monitor you carefully for complications. HOME CARE INSTRUCTIONS  No special instructions are needed after a transfusion. You may find your energy is better. Speak with your caregiver about any limitations on activity for underlying diseases you may have. SEEK MEDICAL CARE IF:   Your condition is not improving after your transfusion.  You develop redness or irritation at the intravenous (IV) site. SEEK IMMEDIATE MEDICAL CARE IF:  Any of the following symptoms occur over the next 12 hours:  Shaking chills.  You have a temperature by mouth above 102 F (38.9 C), not controlled by medicine.  Chest, back, or muscle pain.  People around you feel you are not acting correctly or are confused.  Shortness of breath or difficulty breathing.  Dizziness and fainting.  You get a rash or develop hives.  You have a decrease in urine output.  Your urine turns a dark color or changes to pink, red, or brown. Any of the following symptoms occur over the next 10 days:  You have a temperature by mouth above 102 F (38.9 C), not controlled by medicine.  Shortness of breath.  Weakness after normal activity.  The white part of the eye turns yellow (jaundice).  You have a decrease in the amount of urine or are urinating less often.  Your urine turns a dark color or changes to pink, red, or brown. Document Released: 10/22/2000 Document Revised: 01/17/2012 Document Reviewed: 06/10/2008 [facility] Patient Information 2014 [place], [place].  _______________________________________________________________________

## 2018-10-19 ENCOUNTER — Encounter (HOSPITAL_COMMUNITY)
Admission: RE | Admit: 2018-10-19 | Discharge: 2018-10-19 | Disposition: A | Discharge status: Home / Self Care | Payer: Medicare Other | Source: Ambulatory Visit | Attending: Urology | Admitting: Urology

## 2018-10-19 ENCOUNTER — Encounter (HOSPITAL_COMMUNITY): Payer: Self-pay

## 2018-10-19 ENCOUNTER — Other Ambulatory Visit: Payer: Self-pay

## 2018-10-19 DIAGNOSIS — N201 Calculus of ureter: Secondary | ICD-10-CM

## 2018-10-19 DIAGNOSIS — I10 Essential (primary) hypertension: Secondary | ICD-10-CM | POA: Diagnosis not present

## 2018-10-19 DIAGNOSIS — F419 Anxiety disorder, unspecified: Secondary | ICD-10-CM | POA: Diagnosis not present

## 2018-10-19 DIAGNOSIS — Z79899 Other long term (current) drug therapy: Secondary | ICD-10-CM | POA: Diagnosis not present

## 2018-10-19 DIAGNOSIS — N135 Crossing vessel and stricture of ureter without hydronephrosis: Secondary | ICD-10-CM | POA: Diagnosis not present

## 2018-10-19 DIAGNOSIS — Z01818 Encounter for other preprocedural examination: Secondary | ICD-10-CM

## 2018-10-19 DIAGNOSIS — K219 Gastro-esophageal reflux disease without esophagitis: Secondary | ICD-10-CM | POA: Diagnosis not present

## 2018-10-19 HISTORY — DX: Dyspnea, unspecified: R06.00

## 2018-10-19 HISTORY — DX: Age-related osteoporosis without current pathological fracture: M81.0

## 2018-10-19 HISTORY — DX: Personal history of pneumonia (recurrent): Z87.01

## 2018-10-19 HISTORY — DX: Abrasion, right foot, initial encounter: S90.811A

## 2018-10-19 HISTORY — DX: Hyperlipidemia, unspecified: E78.5

## 2018-10-19 HISTORY — DX: Essential tremor: G25.0

## 2018-10-19 HISTORY — DX: Personal history of other mental and behavioral disorders: Z86.59

## 2018-10-19 HISTORY — DX: Calculus of ureter: N20.1

## 2018-10-19 HISTORY — DX: Other forms of dyspnea: R06.09

## 2018-10-19 HISTORY — DX: Disorder of kidney and ureter, unspecified: N28.9

## 2018-10-19 HISTORY — DX: Presence of other specified functional implants: Z96.89

## 2018-10-19 HISTORY — DX: Presence of dental prosthetic device (complete) (partial): Z97.2

## 2018-10-19 HISTORY — DX: Personal history of other infectious and parasitic diseases: Z86.19

## 2018-10-19 HISTORY — DX: Anemia, unspecified: D64.9

## 2018-10-19 HISTORY — DX: Bariatric surgery status: Z98.84

## 2018-10-19 HISTORY — DX: History of falling: Z91.81

## 2018-10-19 HISTORY — DX: Other specified postprocedural states: Z98.890

## 2018-10-19 HISTORY — DX: Personal history of other specified conditions: Z87.898

## 2018-10-19 LAB — BASIC METABOLIC PANEL
Anion gap: 6 (ref 5–15)
BUN: 15 mg/dL (ref 8–23)
CO2: 28 mmol/L (ref 22–32)
Calcium: 9.1 mg/dL (ref 8.9–10.3)
Chloride: 109 mmol/L (ref 98–111)
Creatinine, Ser: 0.92 mg/dL (ref 0.44–1.00)
GFR calc Af Amer: >60 mL/min (ref >60)
GFR calc non Af Amer: >60 mL/min (ref >60)
Glucose, Bld: 87 mg/dL (ref 70–99)
Potassium: 3.4 mmol/L — ABNORMAL LOW (ref 3.5–5.1)
Sodium: 143 mmol/L (ref 135–145)

## 2018-10-19 LAB — CBC
HCT: 38.3 % (ref 36.0–46.0)
Hemoglobin: 11.7 g/dL — ABNORMAL LOW (ref 12.0–15.0)
MCH: 28.4 pg (ref 26.0–34.0)
MCHC: 30.5 g/dL (ref 30.0–36.0)
MCV: 93 fL (ref 80.0–100.0)
Platelets: 205 10*3/uL (ref 150–400)
RBC: 4.12 MIL/uL (ref 3.87–5.11)
RDW: 14.3 % (ref 11.5–15.5)
WBC: 6.4 10*3/uL (ref 4.0–10.5)
nRBC: 0 % (ref 0.0–0.2)

## 2018-10-19 LAB — ABO/RH: ABO/RH(D): A POS

## 2018-10-19 LAB — TYPE AND SCREEN
ABO/RH(D): A POS
Antibody Screen: NEGATIVE

## 2018-10-19 MED ORDER — GENTAMICIN SULFATE 40 MG/ML IJ SOLN
5.0000 mg/kg | INTRAVENOUS | Status: AC
  Administered 2018-10-20: 290 mg via INTRAVENOUS
  Filled 2018-10-19 (×2): qty 7.25

## 2018-10-19 NOTE — Progress Notes (Addendum)
CXR dated 09-27-2018 in epic.  Nuclear stress test in epic dated 08-20-2016. ECHO in epic dated 08-20-2016.  Pt unable to give any urine sample for urine culture dr Marlou Porchherrick ordered. Will need to be done dos.  ADDENDUM:  Received call back from pam, per dr Marlou Porchherrick urine culture not needed, discontinue order.

## 2018-10-19 NOTE — Anesthesia Preprocedure Evaluation (Addendum)
Anesthesia Evaluation  Patient identified by MRN, date of birth, ID band Patient awake    Reviewed: Allergy & Precautions, NPO status , Patient's Chart, lab work & pertinent test results  History of Anesthesia Complications Negative for: history of anesthetic complications  Airway Mallampati: II  TM Distance: >3 FB Neck ROM: Full    Dental  (+) Dental Advisory Given, Edentulous Upper, Edentulous Lower   Pulmonary neg pulmonary ROS,    Pulmonary exam normal breath sounds clear to auscultation       Cardiovascular hypertension, Normal cardiovascular exam Rhythm:Regular Rate:Normal  Nuclear stress 2017: EF 67%, normal study   Neuro/Psych  Headaches, Anxiety Depression    GI/Hepatic Neg liver ROS, GERD  Controlled,Hx of gastric bypass 2004   Endo/Other  negative endocrine ROS  Renal/GU Renal stones     Musculoskeletal  (+) Arthritis , Osteoarthritis,  Fibromyalgia -, narcotic dependent  Abdominal   Peds  Hematology Hgb 11.7   Anesthesia Other Findings Day of surgery medications reviewed with the patient.  Reproductive/Obstetrics                            Anesthesia Physical Anesthesia Plan  ASA: II  Anesthesia Plan: General   Post-op Pain Management:    Induction: Intravenous  PONV Risk Score and Plan: 3 and Treatment may vary due to age or medical condition, Ondansetron, Dexamethasone and Midazolam  Airway Management Planned: Oral ETT  Additional Equipment:   Intra-op Plan:   Post-operative Plan: Extubation in OR  Informed Consent: I have reviewed the patients History and Physical, chart, labs and discussed the procedure including the risks, benefits and alternatives for the proposed anesthesia with the patient or authorized representative who has indicated his/her understanding and acceptance.   Dental advisory given  Plan Discussed with: CRNA  Anesthesia Plan Comments:         Anesthesia Quick Evaluation

## 2018-10-20 ENCOUNTER — Ambulatory Visit (HOSPITAL_COMMUNITY): Payer: Medicare Other

## 2018-10-20 ENCOUNTER — Ambulatory Visit (HOSPITAL_COMMUNITY): Payer: Medicare Other | Admitting: Anesthesiology

## 2018-10-20 ENCOUNTER — Encounter (HOSPITAL_COMMUNITY): Payer: Self-pay

## 2018-10-20 ENCOUNTER — Ambulatory Visit (HOSPITAL_COMMUNITY)
Admission: RE | Admit: 2018-10-20 | Discharge: 2018-10-20 | Disposition: A | Discharge status: Home / Self Care | Payer: Medicare Other | Attending: Urology | Admitting: Urology

## 2018-10-20 ENCOUNTER — Encounter (HOSPITAL_COMMUNITY)
Admission: RE | Disposition: A | Discharge status: Home / Self Care | Payer: Self-pay | Source: Home / Self Care | Attending: Urology

## 2018-10-20 DIAGNOSIS — I10 Essential (primary) hypertension: Secondary | ICD-10-CM | POA: Insufficient documentation

## 2018-10-20 DIAGNOSIS — N201 Calculus of ureter: Secondary | ICD-10-CM | POA: Insufficient documentation

## 2018-10-20 DIAGNOSIS — N135 Crossing vessel and stricture of ureter without hydronephrosis: Secondary | ICD-10-CM | POA: Insufficient documentation

## 2018-10-20 DIAGNOSIS — F419 Anxiety disorder, unspecified: Secondary | ICD-10-CM | POA: Diagnosis not present

## 2018-10-20 DIAGNOSIS — Z79899 Other long term (current) drug therapy: Secondary | ICD-10-CM | POA: Insufficient documentation

## 2018-10-20 DIAGNOSIS — K219 Gastro-esophageal reflux disease without esophagitis: Secondary | ICD-10-CM | POA: Insufficient documentation

## 2018-10-20 HISTORY — PX: NEPHROLITHOTOMY: SHX5134

## 2018-10-20 SURGERY — NEPHROLITHOTOMY PERCUTANEOUS
Anesthesia: General | Laterality: Left

## 2018-10-20 MED ORDER — HYDRALAZINE HCL 20 MG/ML IJ SOLN
INTRAMUSCULAR | Status: AC
  Filled 2018-10-20: qty 1

## 2018-10-20 MED ORDER — MIDAZOLAM HCL 5 MG/5ML IJ SOLN
INTRAMUSCULAR | Status: DC | PRN
  Administered 2018-10-20: 2 mg via INTRAVENOUS

## 2018-10-20 MED ORDER — LACTATED RINGERS IV SOLN
INTRAVENOUS | Status: DC
  Administered 2018-10-20: 1000 mL via INTRAVENOUS

## 2018-10-20 MED ORDER — PROMETHAZINE HCL 25 MG/ML IJ SOLN: 6.2500 mg | INTRAMUSCULAR | Status: DC | PRN

## 2018-10-20 MED ORDER — 0.9 % SODIUM CHLORIDE (POUR BTL) OPTIME
TOPICAL | Status: DC | PRN
  Administered 2018-10-20: 1000 mL

## 2018-10-20 MED ORDER — DEXAMETHASONE SODIUM PHOSPHATE 10 MG/ML IJ SOLN
INTRAMUSCULAR | Status: AC
  Filled 2018-10-20: qty 1

## 2018-10-20 MED ORDER — SUGAMMADEX SODIUM 200 MG/2ML IV SOLN
INTRAVENOUS | Status: AC
  Filled 2018-10-20: qty 2

## 2018-10-20 MED ORDER — FUROSEMIDE 10 MG/ML IJ SOLN
INTRAMUSCULAR | Status: AC
  Filled 2018-10-20: qty 2

## 2018-10-20 MED ORDER — DEXAMETHASONE SODIUM PHOSPHATE 10 MG/ML IJ SOLN
INTRAMUSCULAR | Status: DC | PRN
  Administered 2018-10-20: 10 mg via INTRAVENOUS

## 2018-10-20 MED ORDER — ONDANSETRON HCL 4 MG/2ML IJ SOLN
INTRAMUSCULAR | Status: AC
  Filled 2018-10-20: qty 2

## 2018-10-20 MED ORDER — ROCURONIUM BROMIDE 10 MG/ML (PF) SYRINGE
PREFILLED_SYRINGE | INTRAVENOUS | Status: AC
  Filled 2018-10-20: qty 10

## 2018-10-20 MED ORDER — SODIUM CHLORIDE 0.9 % IR SOLN
Status: DC | PRN
  Administered 2018-10-20: 6000 mL

## 2018-10-20 MED ORDER — OXYCODONE HCL 5 MG PO TABS
ORAL_TABLET | ORAL | Status: AC
  Filled 2018-10-20: qty 3

## 2018-10-20 MED ORDER — ONDANSETRON HCL 4 MG/2ML IJ SOLN
INTRAMUSCULAR | Status: DC | PRN
  Administered 2018-10-20: 4 mg via INTRAVENOUS

## 2018-10-20 MED ORDER — PROPOFOL 10 MG/ML IV BOLUS
INTRAVENOUS | Status: DC | PRN
  Administered 2018-10-20: 120 mg via INTRAVENOUS

## 2018-10-20 MED ORDER — MIDAZOLAM HCL 2 MG/2ML IJ SOLN
INTRAMUSCULAR | Status: AC
  Filled 2018-10-20: qty 2

## 2018-10-20 MED ORDER — FUROSEMIDE 40 MG PO TABS: 40.0000 mg | ORAL_TABLET | Freq: Every day | ORAL | 11 refills | Status: DC

## 2018-10-20 MED ORDER — HYDROMORPHONE HCL 1 MG/ML IJ SOLN: 0.2500 mg | INTRAMUSCULAR | Status: DC | PRN

## 2018-10-20 MED ORDER — ESMOLOL HCL 100 MG/10ML IV SOLN
INTRAVENOUS | Status: AC
  Filled 2018-10-20: qty 10

## 2018-10-20 MED ORDER — OXYCODONE HCL 5 MG/5ML PO SOLN
10.0000 mg | Freq: Once | ORAL | Status: DC | PRN
  Filled 2018-10-20: qty 10

## 2018-10-20 MED ORDER — IOHEXOL 300 MG/ML  SOLN
INTRAMUSCULAR | Status: DC | PRN
  Administered 2018-10-20: 75 mL

## 2018-10-20 MED ORDER — ESMOLOL HCL 100 MG/10ML IV SOLN
INTRAVENOUS | Status: DC | PRN
  Administered 2018-10-20 (×2): 20 mg via INTRAVENOUS

## 2018-10-20 MED ORDER — FENTANYL CITRATE (PF) 100 MCG/2ML IJ SOLN
INTRAMUSCULAR | Status: DC | PRN
  Administered 2018-10-20 (×2): 25 ug via INTRAVENOUS
  Administered 2018-10-20 (×4): 50 ug via INTRAVENOUS

## 2018-10-20 MED ORDER — CEFAZOLIN SODIUM-DEXTROSE 2-4 GM/100ML-% IV SOLN
2.0000 g | INTRAVENOUS | Status: AC
  Administered 2018-10-20: 2 g via INTRAVENOUS
  Filled 2018-10-20: qty 100

## 2018-10-20 MED ORDER — FUROSEMIDE 10 MG/ML IJ SOLN: 20.0000 mg | Freq: Once | INTRAMUSCULAR | Status: DC

## 2018-10-20 MED ORDER — FENTANYL CITRATE (PF) 250 MCG/5ML IJ SOLN
INTRAMUSCULAR | Status: AC
  Filled 2018-10-20: qty 5

## 2018-10-20 MED ORDER — OXYCODONE HCL 5 MG PO TABS: 15.0000 mg | ORAL_TABLET | Freq: Once | ORAL | Status: DC | PRN

## 2018-10-20 MED ORDER — SUGAMMADEX SODIUM 200 MG/2ML IV SOLN
INTRAVENOUS | Status: DC | PRN
  Administered 2018-10-20: 120 mg via INTRAVENOUS

## 2018-10-20 MED ORDER — HYDRALAZINE HCL 20 MG/ML IJ SOLN
INTRAMUSCULAR | Status: DC | PRN
  Administered 2018-10-20: 5 mg via INTRAVENOUS

## 2018-10-20 MED ORDER — LIDOCAINE 2% (20 MG/ML) 5 ML SYRINGE
INTRAMUSCULAR | Status: AC
  Filled 2018-10-20: qty 5

## 2018-10-20 MED ORDER — STERILE WATER FOR IRRIGATION IR SOLN
Status: DC | PRN
  Administered 2018-10-20: 500 mL

## 2018-10-20 MED ORDER — PROPOFOL 10 MG/ML IV BOLUS
INTRAVENOUS | Status: AC
  Filled 2018-10-20: qty 20

## 2018-10-20 MED ORDER — ROCURONIUM BROMIDE 10 MG/ML (PF) SYRINGE
PREFILLED_SYRINGE | INTRAVENOUS | Status: DC | PRN
  Administered 2018-10-20: 40 mg via INTRAVENOUS
  Administered 2018-10-20: 10 mg via INTRAVENOUS

## 2018-10-20 MED ORDER — OXYCODONE HCL 5 MG PO TABS
15.0000 mg | ORAL_TABLET | Freq: Once | ORAL | Status: AC
  Administered 2018-10-20: 15 mg via ORAL

## 2018-10-20 MED ORDER — LIDOCAINE 2% (20 MG/ML) 5 ML SYRINGE
INTRAMUSCULAR | Status: DC | PRN
  Administered 2018-10-20: 100 mg via INTRAVENOUS

## 2018-10-20 MED ORDER — ACETAMINOPHEN 10 MG/ML IV SOLN: 1000.0000 mg | Freq: Once | INTRAVENOUS | Status: DC | PRN

## 2018-10-20 SURGICAL SUPPLY — 64 items
BAG URINE DRAINAGE (UROLOGICAL SUPPLIES) ×3 IMPLANT
BAG URO CATCHER STRL LF (MISCELLANEOUS) ×3 IMPLANT
BALLN NEPHROSTOMY (BALLOONS)
BALLOON NEPHROSTOMY (BALLOONS) IMPLANT
BASKET STONE 1.7 NGAGE (UROLOGICAL SUPPLIES) IMPLANT
BASKET STONE NCOMPASS (UROLOGICAL SUPPLIES) IMPLANT
BASKET ZERO TIP 1.9FR (BASKET) IMPLANT
BASKET ZERO TIP NITINOL 2.4FR (BASKET) ×3 IMPLANT
BENZOIN TINCTURE PRP APPL 2/3 (GAUZE/BANDAGES/DRESSINGS) ×3 IMPLANT
BLADE SURG 15 STRL LF DISP TIS (BLADE) ×1 IMPLANT
BLADE SURG 15 STRL SS (BLADE) ×2
CATH AINSWORTH 30CC 24FR (CATHETERS) IMPLANT
CATH FOLEY 2W COUNCIL 20FR 5CC (CATHETERS) IMPLANT
CATH FOLEY 2WAY SLVR  5CC 16FR (CATHETERS) ×2
CATH FOLEY 2WAY SLVR 5CC 16FR (CATHETERS) ×1 IMPLANT
CATH IMAGER II 65CM (CATHETERS) ×3 IMPLANT
CATH INTERMIT  6FR 70CM (CATHETERS) IMPLANT
CATH ROBINSON RED A/P 14FR (CATHETERS) ×3 IMPLANT
CATH ULTRATHANE 14FR (CATHETERS) ×3 IMPLANT
CATH URET 5FR 28IN CONE TIP (BALLOONS)
CATH URET 5FR 28IN OPEN ENDED (CATHETERS) ×3 IMPLANT
CATH URET 5FR 70CM CONE TIP (BALLOONS) IMPLANT
CATH URET DUAL LUMEN 6-10FR 50 (CATHETERS) IMPLANT
CATH UROLOGY TORQUE 40 (MISCELLANEOUS) ×3 IMPLANT
CATH X-FORCE N30 NEPHROSTOMY (TUBING) ×3 IMPLANT
CHLORAPREP W/TINT 26ML (MISCELLANEOUS) ×3 IMPLANT
CLOTH BEACON ORANGE TIMEOUT ST (SAFETY) ×3 IMPLANT
COVER WAND RF STERILE (DRAPES) IMPLANT
DRAPE C-ARM 42X120 X-RAY (DRAPES) ×3 IMPLANT
DRAPE LINGEMAN PERC (DRAPES) ×3 IMPLANT
DRAPE SURG IRRIG POUCH 19X23 (DRAPES) ×6 IMPLANT
DRSG PAD ABDOMINAL 8X10 ST (GAUZE/BANDAGES/DRESSINGS) ×3 IMPLANT
DRSG TEGADERM 8X12 (GAUZE/BANDAGES/DRESSINGS) ×3 IMPLANT
FIBER LASER FLEXIVA 365 (UROLOGICAL SUPPLIES) IMPLANT
FIBER LASER TRAC TIP (UROLOGICAL SUPPLIES) ×3 IMPLANT
GAUZE SPONGE 4X4 12PLY STRL (GAUZE/BANDAGES/DRESSINGS) ×3 IMPLANT
GLOVE BIO SURGEON STRL SZ7.5 (GLOVE) ×3 IMPLANT
GLOVE BIOGEL M STRL SZ7.5 (GLOVE) ×3 IMPLANT
GOWN STRL REUS W/TWL LRG LVL3 (GOWN DISPOSABLE) ×6 IMPLANT
GOWN STRL REUS W/TWL XL LVL3 (GOWN DISPOSABLE) ×3 IMPLANT
GUIDEWIRE AMPLAZ .035X145 (WIRE) ×6 IMPLANT
GUIDEWIRE ANG ZIPWIRE 038X150 (WIRE) IMPLANT
GUIDEWIRE STR DUAL SENSOR (WIRE) ×3 IMPLANT
HOLDER NEEDLE AMPLATZ W/INSERT (MISCELLANEOUS) IMPLANT
IV SET EXTENSION CATH 6 NF (IV SETS) IMPLANT
KIT BASIN OR (CUSTOM PROCEDURE TRAY) ×3 IMPLANT
MANIFOLD NEPTUNE II (INSTRUMENTS) ×3 IMPLANT
NEEDLE SPNL 20GX3.5 QUINCKE YW (NEEDLE) IMPLANT
NEEDLE TROCAR 18X15 ECHO (NEEDLE) IMPLANT
NEEDLE TROCAR 18X20 (NEEDLE) IMPLANT
NS IRRIG 1000ML POUR BTL (IV SOLUTION) ×3 IMPLANT
PACK CYSTO (CUSTOM PROCEDURE TRAY) ×3 IMPLANT
SHEATH PEELAWAY SET 9 (SHEATH) IMPLANT
SHEATH URETERAL 12FRX28CM (UROLOGICAL SUPPLIES) ×3 IMPLANT
SPONGE LAP 4X18 RFD (DISPOSABLE) ×3 IMPLANT
SUT ETHILON 3 0 PS 1 (SUTURE) ×6 IMPLANT
SYR 10ML LL (SYRINGE) ×3 IMPLANT
SYR 20CC LL (SYRINGE) ×6 IMPLANT
SYR 50ML LL SCALE MARK (SYRINGE) IMPLANT
TOWEL OR 17X26 10 PK STRL BLUE (TOWEL DISPOSABLE) ×3 IMPLANT
TUBING CONNECTING 10 (TUBING) ×4 IMPLANT
TUBING CONNECTING 10' (TUBING) ×2
TUBING INSUFFLATION 10FT LAP (TUBING) IMPLANT
WATER STERILE IRR 3000ML UROMA (IV SOLUTION) ×3 IMPLANT

## 2018-10-20 NOTE — Op Note (Signed)
Preoperative diagnosis:  1. Left proximal ureteral stone 2. Left UPJ stricture   Postoperative diagnosis:  1. same   Procedure: 1. Left percutaneous nephroscopy 2. Left laser ablation of UPJ stricture 3. Replacement of left nephrostomy tube  Surgeon: Crist FatBenjamin W. Anjeli Casad, MD  Anesthesia: General  Complications: None  Intraoperative findings:  1)  Antegrade nephrostogram was performed which demonstrated no filling defects within the upper tract system and no significant dilation.  There was a bifid collecting system with a narrow calyx in the upper pole.  Access was obtained in the lower pole.  There was a torturous UPJ/proximal ureter with no contrast spilling into the distal ureter. 2)   I was unable to advance a wire through the stricture completely, I did get the  Wire into proximal ureter across the stricture, but I was unable to get anything more than a wire across this area.  I attempted to laser the stricture with a 200 micron laser fiber but I was unsuccessful and obtaining access into the more distal ureter. 3)   I placed a 14 French nephrostomy tube at the end of the case.  EBL: Minimal  Specimens: None  Indication: [name] is a 72 y.o. patient with   The history of a left proximal ureteral septic stone.  She had a nephrostomy tube placed and her infection was treated.  She followed up with me as an outpatient we opted to proceed with ureteroscopy.  I would attempt to access the stone ureteroscopically I was unable to get around the strictured area as it was significantly torturous with a large rigid J in the proximal ureter.  I then opted to proceed with antegrade nephrostomy access hoping that I could access the stricture in this direction.  Our plan was to dilate the stricture in take care of the stone..  After reviewing the management options for treatment, he elected to proceed with the above surgical procedure(s). We have discussed the potential benefits and risks of  the procedure, side effects of the proposed treatment, the likelihood of the patient achieving the goals of the procedure, and any potential problems that might occur during the procedure or recuperation. Informed consent has been obtained.  Description of procedure:  The patient was taken to the operating room and general anesthesia was induced.    The patient was flipped into the prone position on to jelly roll along the anterior axillary line.  Her arms were out in front of her.  She was padded in all the bony prominent and secured to the table.  A Foley catheter was placed.  She was then prepped and draped in the usual sterile fashion, and preoperative antibiotics were administered. A preoperative time-out was performed.    I 1st injected contrast through the existing nephrostomy tube and performed an antegrade nephrostogram  With the above findings.  I advanced a Sensor wire through the previously placed nephrostomy tube and removed the nephrostomy tube over the wire.   I then removed the nephrostomy tube and advanced a dual-lumen catheter over the wire to advance a 2nd wire, a superstiff wire.  I was unable to get the wire across the segment.  I then advanced a 12/14 JamaicaFrench short ureteral access sheath over the wire and through the previously existing nephrostomy tube tract.  Once into the renal pelvis I removed the inner portion of the sheath as well as the wire.  I then performed ureteroscopy at tenting for approximately 1 hour to advance the scope  beyond the strictured segment.  I attempted to laser the segment using a 200 micron laser, but was unsuccessful in finding the lumen.  After quite a bit of time of struggling to pass a wire beyond the strictured segment I opted to place a 14 French nephrostomy tube in the previous trach.  I removed the access sheath over wire.  I then advanced the nephrostomy tube into the renal pelvis and injected contrast to ensure that I was all the way in.  I removed the  inner portion of the neph tube and secured it to the skin with a nylon suture.  The patient was subsequently extubated and returned to the PACU in stable condition.    Disposition:  The patient will be scheduled for a CT scan and a Lasix renogram for further formation regarding the kidney in the function of it as well as the anatomy of the stricture.  Will then follow up in clinic and determine the appropriate steps for management of the dense ureteral stricture.  Crist Fat, M.D.

## 2018-10-20 NOTE — Anesthesia Procedure Notes (Signed)
Procedure Name: Intubation Date/Time: 10/20/2018 9:22 AM Performed by: Ledia Hanford D, CRNA Pre-anesthesia Checklist: Patient identified, Emergency Drugs available, Suction available and Patient being monitored Patient Re-evaluated:Patient Re-evaluated prior to induction Oxygen Delivery Method: Circle system utilized Preoxygenation: Pre-oxygenation with 100% oxygen Induction Type: IV induction Ventilation: Mask ventilation without difficulty Laryngoscope Size: Mac and 4 Grade View: Grade I Tube type: Oral Tube size: 7.5 mm Number of attempts: 1 Airway Equipment and Method: Stylet Placement Confirmation: ETT inserted through vocal cords under direct vision,  positive ETCO2 and breath sounds checked- equal and bilateral Secured at: 20 cm Tube secured with: Tape Dental Injury: Teeth and Oropharynx as per pre-operative assessment

## 2018-10-20 NOTE — Anesthesia Postprocedure Evaluation (Signed)
Anesthesia Post Note  Patient: [name]  Procedure(s) Performed: LEFT NEPHROLITHOTOMY PERCUTANEOUS (Left )     Patient location during evaluation: PACU Anesthesia Type: General Level of consciousness: sedated Pain management: pain level controlled Vital Signs Assessment: post-procedure vital signs reviewed and stable Respiratory status: spontaneous breathing and respiratory function stable Cardiovascular status: stable Postop Assessment: no apparent nausea or vomiting Anesthetic complications: no    Last Vitals:  Vitals:   10/20/18 1315 10/20/18 1330  BP: 138/77 128/76  Pulse: 88 89  Resp: 15 15  Temp: 36.4 C   SpO2: 97% 94%    Last Pain:  Vitals:   10/20/18 1330  TempSrc:   PainSc: Asleep                 Sophia Sperry DANIEL

## 2018-10-20 NOTE — H&P (Signed)
The patient is seen today in follow-up. She initially presented with a septic stone in her left proximal ureter. The nephrostomy tube was placed and she was then taken to the operating room several weeks later after infection is cleared for further evaluation. This proved to be very difficult as the ureter was torturous and quite fixed. I was unable to get beyond the stricture into the kidney. The stone was fragmented, but no stent was left, a nephrostomy tube was left with plans to return for PCNL.   The patient has had low-grade fever, but was not above 101.5. Over the past 48 hours she has not had any fevers. She was discharged home on ciprofloxacin.     ALLERGIES: None   MEDICATIONS: Adderall 20 mg tablet  Amitriptyline Hcl  Effexor Xr 75 mg capsule, ext release 24 hr  Oxycodone Hcl 15 mg tablet     GU PSH: Hysterectomy, 1990    NON-GU PSH: Gastric bypass, 2005    GU PMH: Ureteral calculus - 09/12/2018    NON-GU PMH: Anxiety Arthritis    FAMILY HISTORY: 1 Daughter - Daughter 1 son - Son Heart Attack - Mother   SOCIAL HISTORY: Marital Status: Married Preferred Language: English; Ethnicity: Not Hispanic Or Latino; Race: White Current Smoking Status: Patient has never smoked.   Tobacco Use Assessment Completed: Used Tobacco in last 30 days? Has never drank.  Drinks 4+ caffeinated drinks per day. Patient's occupation is/was Retired.    REVIEW OF SYSTEMS:    GU Review Female:   Patient denies frequent urination, hard to postpone urination, burning /pain with urination, get up at night to urinate, leakage of urine, stream starts and stops, trouble starting your stream, have to strain to urinate, and being pregnant.  Gastrointestinal (Upper):   Patient denies nausea, vomiting, and indigestion/ heartburn.  Gastrointestinal (Lower):   Patient denies diarrhea and constipation.  Constitutional:   Patient denies fever, night sweats, weight loss, and fatigue.  Skin:   Patient  denies skin rash/ lesion and itching.  Eyes:   Patient denies blurred vision and double vision.  Ears/ Nose/ Throat:   Patient denies sore throat and sinus problems.  Hematologic/Lymphatic:   Patient denies swollen glands and easy bruising.  Cardiovascular:   Patient reports leg swelling. Patient denies chest pains.  Respiratory:   Patient denies cough and shortness of breath.  Endocrine:   Patient denies excessive thirst.  Musculoskeletal:   Patient denies back pain and joint pain.  Neurological:   Patient denies headaches and dizziness.  Psychologic:   Patient denies depression and anxiety.   VITAL SIGNS:      10/03/2018 12:15 PM  BP 115/72 mmHg  Pulse 85 /min  Temperature 98.3 F / 36.8 C   PAST DATA REVIEWED:  Source Of History:  Patient  Records Review:   Previous Doctor Records, Previous Patient Records, POC Tool  Notes:                     1 appy checking a   PROCEDURES: None   ASSESSMENT:      ICD-10 Details  1 GU:   Ureteral calculus - N20.1   2   Ureteral stricture - N13.5      PLAN:           Orders Labs Urine Culture          Schedule Return Visit/Planned Activity: ASAP - Schedule Surgery          Document Letter(s):  Created for Patient: Clinical Summary         Notes:   The patient, her husband, and I discussed the torturous proximal ureter and strictured UPJ. The etiology of this remains unclear to me. However, I think the best approach is going to be antegrade through the previously placed nephrostomy tube. I went through this with her and her husband in detail. Our plan would be to perform a percutaneous access into the upper tract and then tried to get a balloon across the UPJ in that manner. If that is unsuccessful, we may need to regroup. This may be retroperitoneal fibrosis or some sort of inflammatory process creating the fibrosis and stricture of the ureter.   The patient will leave a urine specimen today which hopefully we can culture to ensure  that she is on appropriate antibiotics preoperatively.

## 2018-10-20 NOTE — Transfer of Care (Signed)
Immediate Anesthesia Transfer of Care Note  Patient: Molly HawthorneRita C Valtierra  Procedure(s) Performed: LEFT NEPHROLITHOTOMY PERCUTANEOUS (Left )  Patient Location: PACU  Anesthesia Type:General  Level of Consciousness: awake, alert  and oriented  Airway & Oxygen Therapy: Patient Spontanous Breathing and Patient connected to face mask oxygen  Post-op Assessment: Report given to RN and Post -op Vital signs reviewed and stable  Post vital signs: Reviewed and stable  Last Vitals:  Vitals Value Taken Time  BP 144/97 10/20/2018 12:22 PM  Temp    Pulse 88 10/20/2018 12:24 PM  Resp 8 10/20/2018 12:24 PM  SpO2 100 % 10/20/2018 12:24 PM  Vitals shown include unvalidated device data.  Last Pain:  Vitals:   10/20/18 0722  TempSrc:   PainSc: 0-No pain      Patients Stated Pain Goal: 3 (10/20/18 [id number])  Complications: No apparent anesthesia complications

## 2018-10-20 NOTE — Progress Notes (Signed)
Pt's husband is concerned about pt's swelling in her hands, arms and her face.  He states that she did have "some swelling" preop but not in her face and this severe.

## 2018-10-20 NOTE — Progress Notes (Signed)
Dr. Marlou PorchHerrick will come speak to pt and family member before d/c

## 2018-10-20 NOTE — Progress Notes (Signed)
Dr Marlou PorchHerrick contacted d/t pt's swelling.  Verbal order for Lasix 20mg  IVP received, then monitor pt.  Instructed to call MD back if no change.

## 2018-10-21 ENCOUNTER — Encounter (HOSPITAL_COMMUNITY): Payer: Self-pay | Admitting: Urology

## 2018-10-24 ENCOUNTER — Other Ambulatory Visit: Payer: Self-pay | Admitting: Urology

## 2018-10-24 ENCOUNTER — Encounter: Payer: Self-pay | Admitting: Family Medicine

## 2018-10-24 ENCOUNTER — Ambulatory Visit: Payer: Medicare Other | Admitting: Family Medicine

## 2018-10-24 VITALS — BP 138/86 | HR 89 | Temp 98.1°F | Resp 16 | Ht 65.0 in | Wt 123.8 lb

## 2018-10-24 DIAGNOSIS — J189 Pneumonia, unspecified organism: Secondary | ICD-10-CM | POA: Diagnosis not present

## 2018-10-24 DIAGNOSIS — Z79899 Other long term (current) drug therapy: Secondary | ICD-10-CM | POA: Diagnosis not present

## 2018-10-24 DIAGNOSIS — N201 Calculus of ureter: Secondary | ICD-10-CM

## 2018-10-24 DIAGNOSIS — N135 Crossing vessel and stricture of ureter without hydronephrosis: Secondary | ICD-10-CM

## 2018-10-24 DIAGNOSIS — F411 Generalized anxiety disorder: Secondary | ICD-10-CM

## 2018-10-24 DIAGNOSIS — I10 Essential (primary) hypertension: Secondary | ICD-10-CM

## 2018-10-24 DIAGNOSIS — F988 Other specified behavioral and emotional disorders with onset usually occurring in childhood and adolescence: Secondary | ICD-10-CM

## 2018-10-24 DIAGNOSIS — E43 Unspecified severe protein-calorie malnutrition: Secondary | ICD-10-CM

## 2018-10-24 MED ORDER — VENLAFAXINE HCL ER 75 MG PO CP24: 150.0000 mg | ORAL_CAPSULE | Freq: Every day | ORAL | 3 refills | Status: DC

## 2018-10-24 NOTE — Addendum Note (Signed)
Addended by: Thelma BargeICHARDSON, Desere Gwin D on: 10/24/2018 04:46 PM   Modules accepted: Orders

## 2018-10-24 NOTE — Assessment & Plan Note (Signed)
Dec adderall to 20 mg daily----- she may need 30 mg  She will call and let us know

## 2018-10-24 NOTE — Progress Notes (Signed)
Patient ID: [name], female    DOB: May 20, 1946  Age: 72 y.o. MRN: [id number]    Subjective:  Subjective  HPI FREEDOM LOPEZPEREZ presents for hosp f/u from sepsis due to UPJ stone and E coli bacteremia.   Urology unable to remove blockage---- she has an appointment for [facility] renal imaging flow  on the 27th.  Pt is feeling much better.    She needs a refill on her effexor.  She thinks she may need less adderall.  She will try taking 1 tab instead of 2 and let us know how that works.  She has an appointment with cardiology just after Christmas.      Review of Systems  Constitutional: Negative for appetite change, diaphoresis, fatigue and unexpected weight change.  Eyes: Negative for pain, redness and visual disturbance.  Respiratory: Negative for cough, chest tightness, shortness of breath and wheezing.   Cardiovascular: Negative for chest pain, palpitations and leg swelling.  Endocrine: Negative for cold intolerance, heat intolerance, polydipsia, polyphagia and polyuria.  Genitourinary: Negative for difficulty urinating, dysuria and frequency.  Neurological: Positive for weakness. Negative for dizziness, light-headedness, numbness and headaches.    History Past Medical History:  Diagnosis Date  . Abrasion of right heel    during admission 10/ 2019 right heel open skin due to movement on sheet  . ADD (attention deficit disorder)   . Anemia, mild   . Anxiety   . Arthritis    "joints, back"  . At high risk for falls    10-19-2018 pt has fell twice in past week, tripped  . Chronic back pain    "all over my back" (10/02/2013)  . Crushing injury of arm, right 02/06/1989's   "it was crushed; wore cast from fingers to top of my shoulder" (11/25/2  . Crushing injury of left wrist 05/11/1989's  . DOE (dyspnea on exertion)   . Essential tremor   . Fibromyalgia   . History of palpitations 2002   recurrent  . History of panic attacks   . History of pneumonia 08/21/2018   w/ acute respiratory  failure/ hypoxia  . History of sepsis    admission 08-21-2018  secondary to uropathy obstructive from ureteral stone   . Hyperlipidemia   . Hypertension    10-19-2018 PER PT AMLODIPINE ON HOLD SINCE 10/ 2019 DUE TO KIDNEY ISSUES PER DOCTOR  . Idiopathic scoliosis 04/19/2013  . Left ureteral stone   . Migraines    "once/month" (10/02/2013)  . Osteoporosis   . Other vitamin B12 deficiency anemia   . PONV (postoperative nausea and vomiting)   . Pulmonary nodules/lesions, multiple   . Renal insufficiency   . S/P gastric bypass 09/16/2003  . S/P insertion of spinal cord stimulator    per pt remote is missing  . Wears dentures    upper  . Wears glasses   . Wears glasses     She has a past surgical history that includes Knee arthroscopy (Right, 03-31-2001    dr Simonne Come @[facility] ); Tonsillectomy and adenoidectomy (1955); Tubal ligation (1980's); Reduction mammaplasty (Bilateral, 2001); Spinal cord stimulator implant (2016); IR NEPHROSTOMY PLACEMENT LEFT (08/22/2018); Cystoscopy with ureteroscopy and stent placement (Left, 09/27/2018); Roux-en-Y Gastric Bypass (09-16-2003   dr m. Daphine Deutscher  @[facility] ); Cardiac catheterization (12-13-2002    dr Eden Emms); D & C HYSTERSCOPY RESECTION MYOMECTOMY (1980s); Abdominal hysterectomy (1980's); and Nephrolithotomy (Left, 10/20/2018).   Her family history includes COPD in her mother; Emphysema in her mother; Heart attack in her mother; Other  in her mother. She was adopted.She reports that she has never smoked. She has never used smokeless tobacco. She reports that she does not drink alcohol or use drugs.  Current Outpatient Medications on File Prior to Visit  Medication Sig Dispense Refill  . amitriptyline (ELAVIL) 75 MG tablet Take 75 mg by mouth at bedtime.    [name]. amphetamine-dextroamphetamine (ADDERALL XR) 20 MG 24 hr capsule 2 po qam (Patient taking differently: Take 40 mg by mouth daily. ) 60 capsule 0  . clonazePAM (KLONOPIN) 0.5 MG tablet Take 0.5 tablets (0.25 mg  total) by mouth daily as needed for anxiety. 30 tablet 1  . feeding supplement, ENSURE ENLIVE, (ENSURE ENLIVE) LIQD Take 237 mLs by mouth 3 (three) times daily between meals. 237 mL 12  . furosemide (LASIX) 40 MG tablet Take 1 tablet (40 mg total) by mouth daily. 30 tablet 11  . ibuprofen (ADVIL,MOTRIN) 200 MG tablet Take 600 mg by mouth every 6 (six) hours as needed for headache or moderate pain.    [name]. neomycin-bacitracin-polymyxin (NEOSPORIN) ointment Apply 1 application topically daily as needed for wound care.    [name]. oxyCODONE (ROXICODONE) 15 MG immediate release tablet Take 15 mg by mouth 3 (three) times daily.    . SUMAtriptan (IMITREX) 50 MG tablet Take 1 tablet (50 mg total) by mouth every 2 (two) hours as needed for migraine. May repeat in 2 hours if headache persists or recurs. 10 tablet 1   No current facility-administered medications on file prior to visit.      Objective:  Objective  Physical Exam Vitals signs and nursing note reviewed.  Constitutional:      Appearance: She is well-developed.  HENT:     Head: Normocephalic and atraumatic.  Eyes:     Conjunctiva/sclera: Conjunctivae normal.  Neck:     Musculoskeletal: Normal range of motion and neck supple.     Thyroid: No thyromegaly.     Vascular: No carotid bruit or JVD.  Cardiovascular:     Rate and Rhythm: Normal rate and regular rhythm.     Heart sounds: Normal heart sounds. No murmur.  Pulmonary:     Effort: Pulmonary effort is normal. No respiratory distress.     Breath sounds: Normal breath sounds. No wheezing or rales.  Chest:     Chest wall: No tenderness.  Neurological:     Mental Status: She is alert and oriented to person, place, and time.    BP 138/86 (BP Location: Left Arm, Cuff Size: Normal)   Pulse 89   Temp 98.1 F (36.7 C) (Oral)   Resp 16   Ht 5\' 5"  (1.651 m)   Wt 123 lb 12.8 oz (56.2 kg)   SpO2 96%   BMI 20.60 kg/m  Wt Readings from Last 3 Encounters:  10/24/18 123 lb 12.8 oz (56.2 kg)    10/20/18 126 lb (57.2 kg)  10/19/18 125 lb 8 oz (56.9 kg)     Lab Results  Component Value Date   WBC 6.4 10/19/2018   HGB 11.7 (L) 10/19/2018   HCT 38.3 10/19/2018   PLT 205 10/19/2018   GLUCOSE 87 10/19/2018   CHOL 182 10/28/2009   TRIG 155.0 (H) 10/28/2009   HDL 61.30 10/28/2009   LDLDIRECT 129.1 07/17/2007   LDLCALC 90 10/28/2009   ALT 18 09/01/2018   AST 36 09/01/2018   NA 143 10/19/2018   K 3.4 (L) 10/19/2018   CL 109 10/19/2018   CREATININE 0.92 10/19/2018   BUN 15 10/19/2018  CO2 28 10/19/2018   TSH 1.925 08/30/2018   INR 1.24 09/01/2018   HGBA1C 5.3 04/22/2014    Dg C-arm 1-60 Min-no Report  Result Date: 10/20/2018 Fluoroscopy was utilized by the requesting physician.  No radiographic interpretation.     Assessment & Plan:  Plan  I am having [name] maintain her SUMAtriptan, feeding supplement (ENSURE ENLIVE), amitriptyline, neomycin-bacitracin-polymyxin, amphetamine-dextroamphetamine, clonazePAM, oxyCODONE, ibuprofen, furosemide, and venlafaxine XR.  Meds ordered this encounter  Medications  . venlafaxine XR (EFFEXOR-XR) 75 MG 24 hr capsule    Sig: Take 2 capsules (150 mg total) by mouth daily.    Dispense:  180 capsule    Refill:  3    Problem List Items Addressed This Visit      Unprioritized   ADD (attention deficit disorder)    Dec adderall to 20 mg daily----- she may need 30 mg  She will call and let us know       CAP (community acquired pneumonia)    resolved      Essential hypertension    norvasc stopped in hosp due to kidney failure Pt has f/u cardiology  bp 138/86 today       Generalized anxiety disorder   Relevant Medications   venlafaxine XR (EFFEXOR-XR) 75 MG 24 hr capsule   Obstruction of left ureteropelvic junction (UPJ) due to stone    F/u urology Imaging study on 12/27      Protein-calorie malnutrition, severe    Pt eating well per husband          Follow-up: Return in about 6 months (around  04/25/2019), or if symptoms worsen or fail to improve, for annual exam, fasting.  Donato Schultz, DO

## 2018-10-24 NOTE — Assessment & Plan Note (Signed)
norvasc stopped in hosp due to kidney failure Pt has f/u cardiology  bp 138/86 today

## 2018-10-24 NOTE — Assessment & Plan Note (Signed)
Pt eating well per husband

## 2018-10-24 NOTE — Assessment & Plan Note (Signed)
F/u urology Imaging study on 12/27

## 2018-10-24 NOTE — Assessment & Plan Note (Signed)
resolved 

## 2018-10-27 LAB — PAIN MGMT, PROFILE 8 W/CONF, U
6 Acetylmorphine: NEGATIVE ng/mL (ref <10)
AMPHETAMINE: 5904 ng/mL — AB (ref <250)
Alcohol Metabolites: NEGATIVE ng/mL (ref <500)
Alphahydroxyalprazolam: NEGATIVE ng/mL (ref <25)
Alphahydroxymidazolam: 68 ng/mL — ABNORMAL HIGH (ref <50)
Alphahydroxytriazolam: NEGATIVE ng/mL (ref <50)
Aminoclonazepam: 164 ng/mL — ABNORMAL HIGH (ref <25)
Amphetamines: POSITIVE ng/mL — AB (ref <500)
Benzodiazepines: POSITIVE ng/mL — AB (ref <100)
Buprenorphine, Urine: NEGATIVE ng/mL (ref <5)
CREATININE: 107.6 mg/dL
Cocaine Metabolite: NEGATIVE ng/mL (ref <150)
Codeine: NEGATIVE ng/mL (ref <50)
Hydrocodone: NEGATIVE ng/mL (ref <50)
Hydromorphone: NEGATIVE ng/mL (ref <50)
Hydroxyethylflurazepam: NEGATIVE ng/mL (ref <50)
Lorazepam: NEGATIVE ng/mL (ref <50)
MDMA: NEGATIVE ng/mL (ref <500)
Marijuana Metabolite: NEGATIVE ng/mL (ref <20)
Methamphetamine: NEGATIVE ng/mL (ref <250)
Morphine: NEGATIVE ng/mL (ref <50)
Nordiazepam: NEGATIVE ng/mL (ref <50)
Norhydrocodone: NEGATIVE ng/mL (ref <50)
Noroxycodone: >20000 ng/mL — ABNORMAL HIGH (ref <50)
OXYCODONE: POSITIVE ng/mL — AB (ref <100)
Opiates: NEGATIVE ng/mL (ref <100)
Oxazepam: NEGATIVE ng/mL (ref <50)
Oxidant: NEGATIVE ug/mL (ref <200)
Oxycodone: 3607 ng/mL — ABNORMAL HIGH (ref <50)
Oxymorphone: 17111 ng/mL — ABNORMAL HIGH (ref <50)
PH: 6.98 (ref 4.5–9.0)
TEMAZEPAM: NEGATIVE ng/mL (ref <50)

## 2018-11-03 ENCOUNTER — Ambulatory Visit (HOSPITAL_COMMUNITY)
Admission: RE | Admit: 2018-11-03 | Discharge: 2018-11-03 | Disposition: A | Discharge status: Home / Self Care | Payer: Medicare Other | Source: Ambulatory Visit | Attending: Urology | Admitting: Urology

## 2018-11-03 DIAGNOSIS — N135 Crossing vessel and stricture of ureter without hydronephrosis: Secondary | ICD-10-CM | POA: Insufficient documentation

## 2018-11-03 MED ORDER — FUROSEMIDE 10 MG/ML IJ SOLN
INTRAMUSCULAR | Status: AC
  Filled 2018-11-03: qty 4

## 2018-11-03 MED ORDER — TECHNETIUM TC 99M MERTIATIDE
5.0000 | Freq: Once | INTRAVENOUS | Status: AC | PRN
  Administered 2018-11-03: 5 via INTRAVENOUS

## 2018-11-03 MED ORDER — FUROSEMIDE 10 MG/ML IJ SOLN: 28.0000 mg | Freq: Once | INTRAMUSCULAR | Status: DC

## 2018-11-04 ENCOUNTER — Other Ambulatory Visit: Payer: Self-pay | Admitting: Family Medicine

## 2018-11-04 DIAGNOSIS — R51 Headache: Principal | ICD-10-CM

## 2018-11-04 DIAGNOSIS — R519 Headache, unspecified: Secondary | ICD-10-CM

## 2018-11-20 ENCOUNTER — Encounter: Payer: Self-pay | Admitting: *Deleted

## 2018-11-20 ENCOUNTER — Ambulatory Visit: Payer: Medicare Other | Admitting: Cardiology

## 2018-11-20 ENCOUNTER — Encounter: Payer: Self-pay | Admitting: Cardiology

## 2018-11-20 VITALS — BP 132/80 | HR 74 | Ht 65.0 in | Wt 131.0 lb

## 2018-11-20 DIAGNOSIS — R072 Precordial pain: Secondary | ICD-10-CM | POA: Diagnosis not present

## 2018-11-20 DIAGNOSIS — R0609 Other forms of dyspnea: Secondary | ICD-10-CM | POA: Diagnosis not present

## 2018-11-20 NOTE — Progress Notes (Signed)
Cardiology Office Note    Date:  11/20/2018   ID:  [name], DOB [birth year], MRN [id number]  PCP:  [name], Grayling Congress, DO  Cardiologist:  Dr. Delton See  Chief Complaint: Posthospitalization follow-up  History of Present Illness:   [name] is a 73 y.o. female hypertension, chronic diastolic CHF, and obesity and HLD presents for follow up.   She last saw Dr. Delton See 01/2015 for evaluation of lower extremity edema. She was initially given furosemide with significant improvement. LE edema resolved with discontinuation of Adderal.Lower extremity edema return in 01/2015 and she was started back on Lasix. She was also complaining of shortness of breath at rest and on exertion which resolved and she had normal PFTs.  2-D echo 03/07/15 normal systolic function LVEF 55-60% grade 1 DD.  Last seen by [name] 08/05/16 for follow up. BP was stable however she continued to have worsening dyspnea on exertion. Order echo and stress test for further evaluation. She also had back pain that shooting down to left lower extremity--> f/u with back specialist. Follow up echo showed normal LVEF with grade 1 DD. Low risk stress test.   11/20/2018 -patient is coming after year, she has had a rough year, she she has lost 65 pounds, for no obvious reason earlier in the year, then in October was admitted with kidney stone acute renal failure, obstructive nephropathy, sepsis, pneumonia, underwent laser lithotripsy, pyelogram, attempted left ureteral balloon dilatation and placement of drainage.  This is failed and patient is scheduled for nephrectomy sometimes early this year.  She has been having no energy,, feeling significantly short of breath with minimal activity, she has also been having lower extremity edema.  She denies any chest pain.  Past Medical History:  Diagnosis Date  . Abrasion of right heel    during admission 10/ 2019 right heel open skin due to movement on sheet  . ADD (attention  deficit disorder)   . Anemia, mild   . Anxiety   . Arthritis    "joints, back"  . At high risk for falls    10-19-2018 pt has fell twice in past week, tripped  . Chronic back pain    "all over my back" (10/02/2013)  . Crushing injury of arm, right 02/06/1989's   "it was crushed; wore cast from fingers to top of my shoulder" (11/25/2  . Crushing injury of left wrist 05/11/1989's  . DOE (dyspnea on exertion)   . Essential tremor   . Fibromyalgia   . History of palpitations 2002   recurrent  . History of panic attacks   . History of pneumonia 08/21/2018   w/ acute respiratory failure/ hypoxia  . History of sepsis    admission 08-21-2018  secondary to uropathy obstructive from ureteral stone   . Hyperlipidemia   . Hypertension    10-19-2018 PER PT AMLODIPINE ON HOLD SINCE 10/ 2019 DUE TO KIDNEY ISSUES PER DOCTOR  . Idiopathic scoliosis 04/19/2013  . Left ureteral stone   . Migraines    "once/month" (10/02/2013)  . Osteoporosis   . Other vitamin B12 deficiency anemia   . PONV (postoperative nausea and vomiting)   . Pulmonary nodules/lesions, multiple   . Renal insufficiency   . S/P gastric bypass 09/16/2003  . S/P insertion of spinal cord stimulator    per pt remote is missing  . Wears dentures    upper  . Wears glasses   . Wears glasses     Past Surgical History:  Procedure Laterality Date  . ABDOMINAL HYSTERECTOMY  1980's   W/  BSO  AND APPENDECTOMY  . CARDIAC CATHETERIZATION  12-13-2002    dr Eden Emms   normal coronaries  . CYSTOSCOPY WITH URETEROSCOPY AND STENT PLACEMENT Left 09/27/2018   Procedure: LEFT  URETEROSCOPY, HOLMIUM LASER LITHOTRIPSY;  Surgeon: Crist Fat, MD;  Location: [facility];  Service: Urology;  Laterality: Left;  . D & C HYSTERSCOPY RESECTION MYOMECTOMY  1980s  . IR NEPHROSTOMY PLACEMENT LEFT  08/22/2018  . KNEE ARTHROSCOPY Right 03-31-2001    dr Simonne Come @[facility]   . NEPHROLITHOTOMY Left 10/20/2018   Procedure: LEFT NEPHROLITHOTOMY PERCUTANEOUS;   Surgeon: Crist Fat, MD;  Location: [facility];  Service: Urology;  Laterality: Left;  . REDUCTION MAMMAPLASTY Bilateral 2001  . ROUX-EN-Y GASTRIC BYPASS  09-16-2003   dr [name]. [name]  @[facility]    via Laparoscopy w/ gastrojejunostomy  . SPINAL CORD STIMULATOR IMPLANT  2016   10-19-2018  PER PT HAS REMOTE BUT HAS NOT USED IT SINCE DEC 2018  . TONSILLECTOMY AND ADENOIDECTOMY  1955  . TUBAL LIGATION  1980's    Current Medications: Prior to Admission medications   Medication Sig Start Date End Date Taking? Authorizing Provider  amitriptyline (ELAVIL) 75 MG tablet Take 1 tablet (75 mg total) by mouth daily. 10/11/16   [name] R, DO  amLODipine (NORVASC) 5 MG tablet Take 1 tablet (5 mg total) by mouth daily. 08/04/17   [name] M, PA-C  amphetamine-dextroamphetamine (ADDERALL XR) 30 MG 24 hr capsule Take 1 capsule (30 mg total) by mouth every morning. 08/12/17   Molly Schultz, DO  aspirin 81 MG tablet Take 81 mg by mouth daily.    [provider]  ciprofloxacin (CIPRO) 500 MG tablet Take 1 tablet (500 mg total) by mouth 2 (two) times daily. 07/08/17   Molly Porter, MD  clonazePAM (KLONOPIN) 0.5 MG tablet TAKE ONE (1) TABLET BY MOUTH 3 TIMES DAILY AS NEEDED FOR ANXIETY 04/15/17   [name], Grayling Congress, DO  furosemide (LASIX) 40 MG tablet Take 40 mg by mouth daily as needed for edema. 07/05/16   [provider]  gabapentin (NEURONTIN) 300 MG capsule Take 300 mg by mouth 3 (three) times daily.    [provider]  methylPREDNISolone (MEDROL DOSEPAK) 4 MG TBPK tablet Take 5 mg by mouth.    [provider]  morphine (MSIR) 15 MG tablet Take 15 mg by mouth every 12 (twelve) hours as needed for severe pain (for pain).    [provider]  Oxycodone HCl 20 MG TABS Take 1 tablet by mouth 4 (four) times daily as needed (pain).  03/06/15   [provider]  SUMAtriptan (IMITREX) 50 MG tablet Take 1 tablet (50 mg total) by mouth every 2  (two) hours as needed for migraine. May repeat in 2 hours if headache persists or recurs. 07/04/17   Molly Schultz, DO  tiZANidine (ZANAFLEX) 4 MG capsule Take 4 mg by mouth 3 (three) times daily as needed for muscle spasms.    [provider]  venlafaxine XR (EFFEXOR-XR) 75 MG 24 hr capsule Take 3 capsules (225 mg total) by mouth daily. 08/05/17   Molly Schultz, DO    Allergies:   Patient has no known allergies.   Social History   Socioeconomic History  . Marital status: Married    Spouse name: Not on file  . Number of children: 2  . Years of education: Not  on file  . Highest education level: Not on file  Occupational History  . Occupation: Retired  Engineer, production  . Financial resource strain: Not on file  . Food insecurity:    Worry: Not on file    Inability: Not on file  . Transportation needs:    Medical: Not on file    Non-medical: Not on file  Tobacco Use  . Smoking status: Never Smoker  . Smokeless tobacco: Never Used  Substance and Sexual Activity  . Alcohol use: No  . Drug use: No  . Sexual activity: Never    Partners: Male  Lifestyle  . Physical activity:    Days per week: Not on file    Minutes per session: Not on file  . Stress: Not on file  Relationships  . Social connections:    Talks on phone: Not on file    Gets together: Not on file    Attends religious service: Not on file    Active member of club or organization: Not on file    Attends meetings of clubs or organizations: Not on file    Relationship status: Not on file  Other Topics Concern  . Not on file  Social History Narrative  . Not on file     Family History:  The patient's family history includes COPD in her mother; Emphysema in her mother; Heart attack in her mother; Other in her mother. She was adopted.   ROS:   Please see the history of present illness.    ROS All other systems reviewed and are negative.   PHYSICAL EXAM:   VS:  BP 132/80   Pulse 74   Ht 5'  5" (1.651 m)   Wt 131 lb (59.4 kg)   SpO2 99%   BMI 21.80 kg/m    GEN: Older appearing, emaciated, well developed, in no acute distress  HEENT: normal  Neck: no JVD, carotid bruits, or masses Cardiac: RRR; no murmurs, rubs, or gallops,no edema  Respiratory:  clear to auscultation bilaterally, normal work of breathing GI: soft, nontender, nondistended, + BS MS: no deformity or atrophy  Skin: warm and dry, no rash Neuro:  Alert and Oriented x 3, Strength and sensation are intact Psych: euthymic mood, full affect  Wt Readings from Last 3 Encounters:  11/20/18 131 lb (59.4 kg)  10/24/18 123 lb 12.8 oz (56.2 kg)  10/20/18 126 lb (57.2 kg)     Studies/Labs Reviewed:   EKG:  EKG is ordered today.  The ekg ordered today demonstrates SR  Recent Labs: 08/30/2018: TSH 1.925 09/01/2018: ALT 18; Magnesium 1.9 10/19/2018: BUN 15; Creatinine, Ser 0.92; Hemoglobin 11.7; Platelets 205; Potassium 3.4; Sodium 143   Lipid Panel    Component Value Date/Time   CHOL 182 10/28/2009 0000   TRIG 155.0 (H) 10/28/2009 0000   HDL 61.30 10/28/2009 0000   CHOLHDL 3 10/28/2009 0000   VLDL 31.0 10/28/2009 0000   LDLCALC 90 10/28/2009 0000   LDLDIRECT 129.1 07/17/2007 0840    Additional studies/ records that were reviewed today include:    Stress test 08/20/2016  Nuclear stress EF: 68%.  There was no ST segment deviation noted during stress.  No T wave inversion was noted during stress.  This is a low risk study.  The study is normal.   Low risk stress nuclear study with normal perfusion and normal left ventricular regional and global systolic function.  Echo 08/20/2016 Study Conclusions  - Left ventricle: The cavity size was normal. Systolic  function was   normal. The estimated ejection fraction was in the range of 60%   to 65%. Wall motion was normal; there were no regional wall   motion abnormalities. Doppler parameters are consistent with   abnormal left ventricular relaxation  (grade 1 diastolic   dysfunction). - Aortic valve: Trileaflet; mildly thickened, mildly calcified   leaflets. - Aorta: Ascending aortic diameter: 38 mm (S). - Ascending aorta: The ascending aorta was mildly dilated. - Mitral valve: Calcified annulus. Mildly thickened leaflets . - Pericardium, extracardiac: Hepatic cyst noted. Consider dedicated   abdominal ultrasound for further illustration.  Impressions:  - Compared to the prior study, there has been no significant   interval change. ASSESSMENT & PLAN:   1.  Dyspnea on exertion -We will obtain Lexiscan nuclear stress test to evaluate for ischemia especially that she will require nephrectomy in the next few months. -We will obtain echocardiogram to evaluate for her systolic diastolic function, signs of fluid overload.  2. Chronic diastolic CHF -She is taking Lasix 40 mg daily.  3. HTN -Without any medication since she lost 65 pounds.   Medication Adjustments/Labs and Tests Ordered: Current medicines are reviewed at length with the patient today.  Concerns regarding medicines are outlined above.  Medication changes, Labs and Tests ordered today are listed in the Patient Instructions below. Patient Instructions  Medication Instructions:   Your physician recommends that you continue on your current medications as directed. Please refer to the Current Medication list given to you today.   Testing/Procedures:  Your physician has requested that you have an echocardiogram. Echocardiography is a painless test that uses sound waves to create images of your heart. It provides your doctor with information about the size and shape of your heart and how well your heart's chambers and valves are working. This procedure takes approximately one hour. There are no restrictions for this procedure.   Your physician has requested that you have a lexiscan myoview. For further information please visit [email or web address]. Please follow  instruction sheet, as given.     Follow-Up:  ADD TO DR Daegon Deiss'S SCHEDULE ON 12/19/18 AT HER 10:40 AM SLOT--PER DR Delton SeeNELSON       If you need a refill on your cardiac medications before your next appointment, please call your pharmacy.      Signed, Tobias AlexanderKatarina Gerald Honea, MD  11/20/2018 6:09 PM    [facility] [address], [place], [place]  [address] Phone: [phone]; Fax: [phone]

## 2018-11-20 NOTE — Patient Instructions (Signed)
Medication Instructions:   Your physician recommends that you continue on your current medications as directed. Please refer to the Current Medication list given to you today.   Testing/Procedures:  Your physician has requested that you have an echocardiogram. Echocardiography is a painless test that uses sound waves to create images of your heart. It provides your doctor with information about the size and shape of your heart and how well your heart's chambers and valves are working. This procedure takes approximately one hour. There are no restrictions for this procedure.   Your physician has requested that you have a lexiscan myoview. For further information please visit [email or web address] Please follow instruction sheet, as given.     Follow-Up:  ADD TO DR NELSON'S SCHEDULE ON 12/19/18 AT HER 10:40 AM SLOT--PER DR Delton See       If you need a refill on your cardiac medications before your next appointment, please call your pharmacy.

## 2018-11-25 ENCOUNTER — Other Ambulatory Visit: Payer: Self-pay | Admitting: Family Medicine

## 2018-11-25 DIAGNOSIS — F988 Other specified behavioral and emotional disorders with onset usually occurring in childhood and adolescence: Secondary | ICD-10-CM

## 2018-11-28 NOTE — Telephone Encounter (Deleted)
Database ran and is on your desk for review.  Last filled per database: 10/04/18 Last written: 10/03/18 Last ov: 10/03/18 Next ov: 04/05/19 Contract: 12/16/18 UDS: 12/16/18

## 2018-11-28 NOTE — Telephone Encounter (Signed)
Database ran and is on your desk for review.  Last filled per database: 10/04/18 Last written: 10/03/18  Last ov: 10/24/18 Next ov: none Contract: 02/08/19 UDS: 04/25/19

## 2018-12-05 ENCOUNTER — Telehealth (HOSPITAL_COMMUNITY): Payer: Self-pay | Admitting: *Deleted

## 2018-12-05 NOTE — Telephone Encounter (Signed)
Patient given detailed instructions per Myocardial Perfusion Study Information Sheet for the test on 12/11/18 at 1015. Patient notified to arrive 15 minutes early and that it is imperative to arrive on time for appointment to keep from having the test rescheduled.  If you need to cancel or reschedule your appointment, please call the office within 24 hours of your appointment. . Patient verbalized understanding.Kimara Bencomo, Adelene Idler

## 2018-12-07 ENCOUNTER — Other Ambulatory Visit: Payer: Self-pay

## 2018-12-07 ENCOUNTER — Inpatient Hospital Stay (HOSPITAL_COMMUNITY)
Admission: EM | Admit: 2018-12-07 | Discharge: 2018-12-12 | DRG: 689 | Disposition: A | Discharge status: Home / Self Care | Payer: Medicare Other | Attending: Internal Medicine | Admitting: Internal Medicine

## 2018-12-07 ENCOUNTER — Emergency Department (HOSPITAL_COMMUNITY): Payer: Medicare Other

## 2018-12-07 ENCOUNTER — Encounter (HOSPITAL_COMMUNITY): Payer: Self-pay | Admitting: Emergency Medicine

## 2018-12-07 DIAGNOSIS — G250 Essential tremor: Secondary | ICD-10-CM | POA: Diagnosis present

## 2018-12-07 DIAGNOSIS — R410 Disorientation, unspecified: Secondary | ICD-10-CM | POA: Diagnosis not present

## 2018-12-07 DIAGNOSIS — E440 Moderate protein-calorie malnutrition: Secondary | ICD-10-CM | POA: Diagnosis present

## 2018-12-07 DIAGNOSIS — F419 Anxiety disorder, unspecified: Secondary | ICD-10-CM | POA: Diagnosis present

## 2018-12-07 DIAGNOSIS — E876 Hypokalemia: Secondary | ICD-10-CM | POA: Diagnosis present

## 2018-12-07 DIAGNOSIS — N200 Calculus of kidney: Secondary | ICD-10-CM | POA: Diagnosis present

## 2018-12-07 DIAGNOSIS — R0602 Shortness of breath: Secondary | ICD-10-CM | POA: Diagnosis not present

## 2018-12-07 DIAGNOSIS — Z9181 History of falling: Secondary | ICD-10-CM

## 2018-12-07 DIAGNOSIS — G92 Toxic encephalopathy: Secondary | ICD-10-CM | POA: Diagnosis present

## 2018-12-07 DIAGNOSIS — K219 Gastro-esophageal reflux disease without esophagitis: Secondary | ICD-10-CM | POA: Diagnosis present

## 2018-12-07 DIAGNOSIS — R6520 Severe sepsis without septic shock: Secondary | ICD-10-CM | POA: Diagnosis not present

## 2018-12-07 DIAGNOSIS — N136 Pyonephrosis: Principal | ICD-10-CM | POA: Diagnosis present

## 2018-12-07 DIAGNOSIS — B952 Enterococcus as the cause of diseases classified elsewhere: Secondary | ICD-10-CM | POA: Diagnosis present

## 2018-12-07 DIAGNOSIS — N179 Acute kidney failure, unspecified: Secondary | ICD-10-CM | POA: Diagnosis present

## 2018-12-07 DIAGNOSIS — A4151 Sepsis due to Escherichia coli [E. coli]: Secondary | ICD-10-CM | POA: Diagnosis not present

## 2018-12-07 DIAGNOSIS — B9562 Methicillin resistant Staphylococcus aureus infection as the cause of diseases classified elsewhere: Secondary | ICD-10-CM | POA: Diagnosis present

## 2018-12-07 DIAGNOSIS — N135 Crossing vessel and stricture of ureter without hydronephrosis: Secondary | ICD-10-CM | POA: Diagnosis present

## 2018-12-07 DIAGNOSIS — Z8249 Family history of ischemic heart disease and other diseases of the circulatory system: Secondary | ICD-10-CM

## 2018-12-07 DIAGNOSIS — Z79891 Long term (current) use of opiate analgesic: Secondary | ICD-10-CM | POA: Diagnosis not present

## 2018-12-07 DIAGNOSIS — N390 Urinary tract infection, site not specified: Secondary | ICD-10-CM | POA: Diagnosis not present

## 2018-12-07 DIAGNOSIS — F329 Major depressive disorder, single episode, unspecified: Secondary | ICD-10-CM | POA: Diagnosis present

## 2018-12-07 DIAGNOSIS — Z9682 Presence of neurostimulator: Secondary | ICD-10-CM

## 2018-12-07 DIAGNOSIS — I10 Essential (primary) hypertension: Secondary | ICD-10-CM | POA: Diagnosis present

## 2018-12-07 DIAGNOSIS — N1330 Unspecified hydronephrosis: Secondary | ICD-10-CM | POA: Diagnosis not present

## 2018-12-07 DIAGNOSIS — A419 Sepsis, unspecified organism: Secondary | ICD-10-CM | POA: Diagnosis present

## 2018-12-07 DIAGNOSIS — E782 Mixed hyperlipidemia: Secondary | ICD-10-CM | POA: Diagnosis not present

## 2018-12-07 DIAGNOSIS — N12 Tubulo-interstitial nephritis, not specified as acute or chronic: Secondary | ICD-10-CM | POA: Diagnosis not present

## 2018-12-07 DIAGNOSIS — E785 Hyperlipidemia, unspecified: Secondary | ICD-10-CM | POA: Diagnosis present

## 2018-12-07 DIAGNOSIS — G9341 Metabolic encephalopathy: Secondary | ICD-10-CM | POA: Diagnosis not present

## 2018-12-07 DIAGNOSIS — M797 Fibromyalgia: Secondary | ICD-10-CM | POA: Diagnosis present

## 2018-12-07 DIAGNOSIS — K210 Gastro-esophageal reflux disease with esophagitis: Secondary | ICD-10-CM | POA: Diagnosis not present

## 2018-12-07 DIAGNOSIS — F05 Delirium due to known physiological condition: Secondary | ICD-10-CM | POA: Diagnosis present

## 2018-12-07 DIAGNOSIS — Z825 Family history of asthma and other chronic lower respiratory diseases: Secondary | ICD-10-CM

## 2018-12-07 DIAGNOSIS — Z9884 Bariatric surgery status: Secondary | ICD-10-CM | POA: Diagnosis not present

## 2018-12-07 LAB — CBC
HCT: 33.3 % — ABNORMAL LOW (ref 36.0–46.0)
HEMOGLOBIN: 10.4 g/dL — AB (ref 12.0–15.0)
MCH: 28.4 pg (ref 26.0–34.0)
MCHC: 31.2 g/dL (ref 30.0–36.0)
MCV: 91 fL (ref 80.0–100.0)
PLATELETS: 186 10*3/uL (ref 150–400)
RBC: 3.66 MIL/uL — AB (ref 3.87–5.11)
RDW: 13.4 % (ref 11.5–15.5)
WBC: 6 10*3/uL (ref 4.0–10.5)
nRBC: 0 % (ref 0.0–0.2)

## 2018-12-07 LAB — URINALYSIS, ROUTINE W REFLEX MICROSCOPIC
Bilirubin Urine: NEGATIVE
Glucose, UA: NEGATIVE mg/dL
Hgb urine dipstick: NEGATIVE
Ketones, ur: NEGATIVE mg/dL
Nitrite: NEGATIVE
Protein, ur: 100 mg/dL — AB
Specific Gravity, Urine: 1.024 (ref 1.005–1.030)
WBC, UA: >50 WBC/hpf — ABNORMAL HIGH (ref 0–5)
pH: 5 (ref 5.0–8.0)

## 2018-12-07 LAB — COMPREHENSIVE METABOLIC PANEL
ALT: 7 U/L (ref 0–44)
ANION GAP: 11 (ref 5–15)
AST: 14 U/L — ABNORMAL LOW (ref 15–41)
Albumin: 3.6 g/dL (ref 3.5–5.0)
Alkaline Phosphatase: 66 U/L (ref 38–126)
BUN: 25 mg/dL — ABNORMAL HIGH (ref 8–23)
CO2: 24 mmol/L (ref 22–32)
Calcium: 9.1 mg/dL (ref 8.9–10.3)
Chloride: 100 mmol/L (ref 98–111)
Creatinine, Ser: 1.63 mg/dL — ABNORMAL HIGH (ref 0.44–1.00)
GFR calc Af Amer: 37 mL/min — ABNORMAL LOW (ref >60)
GFR calc non Af Amer: 32 mL/min — ABNORMAL LOW (ref >60)
Glucose, Bld: 97 mg/dL (ref 70–99)
Potassium: 3.2 mmol/L — ABNORMAL LOW (ref 3.5–5.1)
Sodium: 135 mmol/L (ref 135–145)
Total Bilirubin: 1 mg/dL (ref 0.3–1.2)
Total Protein: 7.5 g/dL (ref 6.5–8.1)

## 2018-12-07 LAB — LIPASE, BLOOD: Lipase: 25 U/L (ref 11–51)

## 2018-12-07 MED ORDER — VENLAFAXINE HCL ER 150 MG PO CP24
150.0000 mg | ORAL_CAPSULE | Freq: Every day | ORAL | Status: DC
  Administered 2018-12-08 – 2018-12-12 (×5): 150 mg via ORAL
  Filled 2018-12-07 (×5): qty 1

## 2018-12-07 MED ORDER — SODIUM CHLORIDE 0.45 % IV SOLN
INTRAVENOUS | Status: DC
  Administered 2018-12-07 – 2018-12-11 (×4): via INTRAVENOUS

## 2018-12-07 MED ORDER — AMPHETAMINE-DEXTROAMPHET ER 20 MG PO CP24
20.0000 mg | ORAL_CAPSULE | Freq: Every day | ORAL | Status: DC
  Administered 2018-12-08 – 2018-12-12 (×5): 20 mg via ORAL
  Filled 2018-12-07 (×5): qty 1

## 2018-12-07 MED ORDER — SUMATRIPTAN SUCCINATE 50 MG PO TABS
100.0000 mg | ORAL_TABLET | Freq: Once | ORAL | Status: AC
  Administered 2018-12-07: 100 mg via ORAL
  Filled 2018-12-07: qty 2

## 2018-12-07 MED ORDER — HEPARIN SODIUM (PORCINE) 5000 UNIT/ML IJ SOLN
5000.0000 [IU] | Freq: Three times a day (TID) | INTRAMUSCULAR | Status: DC
  Administered 2018-12-07 – 2018-12-08 (×2): 5000 [IU] via SUBCUTANEOUS
  Filled 2018-12-07 (×2): qty 1

## 2018-12-07 MED ORDER — SODIUM CHLORIDE 0.9 % IV BOLUS
500.0000 mL | Freq: Once | INTRAVENOUS | Status: AC
  Administered 2018-12-07: 500 mL via INTRAVENOUS

## 2018-12-07 MED ORDER — MORPHINE SULFATE (PF) 2 MG/ML IV SOLN
2.0000 mg | INTRAVENOUS | Status: DC | PRN
  Administered 2018-12-08 – 2018-12-09 (×4): 2 mg via INTRAVENOUS
  Filled 2018-12-07 (×4): qty 1

## 2018-12-07 MED ORDER — SODIUM CHLORIDE 0.9 % IV SOLN
1.0000 g | Freq: Every day | INTRAVENOUS | Status: DC
  Administered 2018-12-08 – 2018-12-09 (×2): 1 g via INTRAVENOUS
  Filled 2018-12-07 (×2): qty 1

## 2018-12-07 MED ORDER — AMITRIPTYLINE HCL 25 MG PO TABS
75.0000 mg | ORAL_TABLET | Freq: Every day | ORAL | Status: DC
  Administered 2018-12-07 – 2018-12-11 (×5): 75 mg via ORAL
  Filled 2018-12-07 (×5): qty 3

## 2018-12-07 MED ORDER — SODIUM CHLORIDE 0.9 % IV SOLN
1.0000 g | Freq: Once | INTRAVENOUS | Status: AC
  Administered 2018-12-07: 1 g via INTRAVENOUS
  Filled 2018-12-07: qty 10

## 2018-12-07 MED ORDER — ONDANSETRON HCL 4 MG PO TABS: 4.0000 mg | ORAL_TABLET | Freq: Four times a day (QID) | ORAL | Status: DC | PRN

## 2018-12-07 MED ORDER — IBUPROFEN 200 MG PO TABS: 200.0000 mg | ORAL_TABLET | Freq: Two times a day (BID) | ORAL | Status: DC | PRN

## 2018-12-07 MED ORDER — CLONAZEPAM 0.125 MG PO TBDP
0.1250 mg | ORAL_TABLET | Freq: Every day | ORAL | Status: DC | PRN
  Administered 2018-12-07 – 2018-12-08 (×2): 0.125 mg via ORAL
  Filled 2018-12-07 (×2): qty 1

## 2018-12-07 MED ORDER — ONDANSETRON HCL 4 MG/2ML IJ SOLN: 4.0000 mg | Freq: Four times a day (QID) | INTRAMUSCULAR | Status: DC | PRN

## 2018-12-07 NOTE — ED Provider Notes (Signed)
[facility]-EMERGENCY DEPT Provider Note   CSN: [id number] Arrival date & time: 12/07/18  1541     History   Chief Complaint Chief Complaint  Patient presents with  . Flank Pain  . Hallucinations    HPI Burnard HawthorneRita C Jakes is a 73 y.o. female.  The history is provided by the patient and medical records. No language interpreter was used.  Flank Pain    Burnard HawthorneRita C Wexler is a 73 y.o. female who presents to the Emergency Department complaining of flank pain, hallucinations.  Level V caveat due to confusion.  She complains of pain to her left side starting about 6 days ago.  She reports temperature to 99-100 at home.  Denies nausea, vomiting, diarrhea. She has a complicated urologic history with prior infected stone, ureteral structure and nephrostomy tube placement. Her nephrostomy tube fell out in early January. She feels like she is having increasing episodes of confusion with occasional hallucinations. Symptoms are similar to what prompted a prior admission for urosepsis. Past Medical History:  Diagnosis Date  . Abrasion of right heel    during admission 10/ 2019 right heel open skin due to movement on sheet  . ADD (attention deficit disorder)   . Anemia, mild   . Anxiety   . Arthritis    "joints, back"  . At high risk for falls    10-19-2018 pt has fell twice in past week, tripped  . Chronic back pain    "all over my back" (10/02/2013)  . Crushing injury of arm, right 02/06/1989's   "it was crushed; wore cast from fingers to top of my shoulder" (11/25/2  . Crushing injury of left wrist 05/11/1989's  . DOE (dyspnea on exertion)   . Essential tremor   . Fibromyalgia   . History of palpitations 2002   recurrent  . History of panic attacks   . History of pneumonia 08/21/2018   w/ acute respiratory failure/ hypoxia  . History of sepsis    admission 08-21-2018  secondary to uropathy obstructive from ureteral stone   . Hyperlipidemia   . Hypertension    10-19-2018  PER PT AMLODIPINE ON HOLD SINCE 10/ 2019 DUE TO KIDNEY ISSUES PER DOCTOR  . Idiopathic scoliosis 04/19/2013  . Left ureteral stone   . Migraines    "once/month" (10/02/2013)  . Osteoporosis   . Other vitamin B12 deficiency anemia   . PONV (postoperative nausea and vomiting)   . Pulmonary nodules/lesions, multiple   . Renal insufficiency   . S/P gastric bypass 09/16/2003  . S/P insertion of spinal cord stimulator    per pt remote is missing  . Wears dentures    upper  . Wears glasses   . Wears glasses     Patient Active Problem List   Diagnosis Date Noted  . Delirium 12/07/2018  . Hydronephrosis of left kidney 12/07/2018  . Pyelonephritis 12/07/2018  . Protein-calorie malnutrition, severe 08/24/2018  . Pressure injury of skin 08/22/2018  . Obstruction of left ureteropelvic junction (UPJ) due to stone 08/21/2018  . Hypoalbuminemia 08/21/2018  . Chronic pain disorder 08/21/2018  . Thrombocytopenia (HCC) 08/21/2018  . Acute renal failure (ARF) (HCC) 08/21/2018  . Compression fracture of spine (HCC) 08/21/2018  . Acute respiratory failure with hypoxemia (HCC)   . Hematoma 06/03/2018  . Dysphagia 02/07/2018  . Generalized anxiety disorder 07/04/2017  . Nonintractable headache 07/04/2017  . Exertional dyspnea 08/05/2016  . Sepsis (HCC) 03/07/2015  . Hypokalemia 03/07/2015  . Acute encephalopathy 03/06/2015  .  Fever 03/06/2015  . Chronic back pain 01/15/2015  . Obesity (BMI 30-39.9) 10/15/2013  . Pulmonary nodules 10/15/2013  . CAP (community acquired pneumonia) 10/02/2013  . Essential hypertension 08/24/2013  . Idiopathic scoliosis 04/19/2013  . Back pain 04/19/2013  . Osteoporosis 04/19/2013  . Edema 04/19/2013  . Mild diastolic dysfunction 01/01/2013  . Leg pain, bilateral 12/16/2012  . ADD (attention deficit disorder) 05/24/2012  . Involuntary movements 05/24/2012  . DIZZINESS 12/31/2009  . TREMOR, ESSENTIAL 12/12/2009  . PALPITATIONS, RECURRENT 12/12/2009  .  OTHER VITAMIN B12 DEFICIENCY ANEMIA 10/30/2009  . FEMALE STRESS INCONTINENCE 10/28/2009  . MEMORY LOSS 10/28/2009  . Morbid obesity (HCC) 12/03/2008  . GERD 12/03/2008  . ANEMIA, MILD 06/26/2008  . DEPRESSION/ANXIETY 12/28/2007  . PICA 12/28/2007  . ACUTE BRONCHITIS 12/28/2007  . FATIGUE 12/28/2007  . Hyperlipidemia 05/15/2007  . ARTIFICIAL MENOPAUSE 05/15/2007  . Myalgia and myositis, unspecified 05/15/2007  . HEADACHE 05/15/2007  . CHEST PAIN, ATYPICAL 05/15/2007  . SYMPTOM, PAIN, ABDOMINAL, EPIGASTRIC 05/15/2007  . REDUCTION MAMMOPLASTY, HX OF 05/15/2007  . PSTPRC STATUS, BARIATRIC SURGERY 05/15/2007    Past Surgical History:  Procedure Laterality Date  . ABDOMINAL HYSTERECTOMY  1980's   W/  BSO  AND APPENDECTOMY  . CARDIAC CATHETERIZATION  12-13-2002    dr Eden Emms   normal coronaries  . CYSTOSCOPY WITH URETEROSCOPY AND STENT PLACEMENT Left 09/27/2018   Procedure: LEFT  URETEROSCOPY, HOLMIUM LASER LITHOTRIPSY;  Surgeon: Crist Fat, MD;  Location: [facility];  Service: Urology;  Laterality: Left;  . D & C HYSTERSCOPY RESECTION MYOMECTOMY  1980s  . IR NEPHROSTOMY PLACEMENT LEFT  08/22/2018  . KNEE ARTHROSCOPY Right 03-31-2001    dr Simonne Come @[facility]   . NEPHROLITHOTOMY Left 10/20/2018   Procedure: LEFT NEPHROLITHOTOMY PERCUTANEOUS;  Surgeon: Crist Fat, MD;  Location: [facility];  Service: Urology;  Laterality: Left;  . REDUCTION MAMMAPLASTY Bilateral 2001  . ROUX-EN-Y GASTRIC BYPASS  09-16-2003   dr Judie Petit. Daphine Deutscher  @[facility]    via Laparoscopy w/ gastrojejunostomy  . SPINAL CORD STIMULATOR IMPLANT  2016   10-19-2018  PER PT HAS REMOTE BUT HAS NOT USED IT SINCE DEC 2018  . TONSILLECTOMY AND ADENOIDECTOMY  1955  . TUBAL LIGATION  1980's     OB History   No obstetric history on file.      Home Medications    Prior to Admission medications   Medication Sig Start Date End Date Taking? Authorizing Provider  amitriptyline (ELAVIL) 75 MG tablet Take 75 mg by mouth at bedtime.    Yes [provider]  amphetamine-dextroamphetamine (ADDERALL XR) 20 MG 24 hr capsule TAKE TWO CAPSULE BY MOUTH EVERY MORNING 11/28/18  Yes [name] R, DO  clonazePAM (KLONOPIN) 0.5 MG tablet Take 0.5 tablets (0.25 mg total) by mouth daily as needed for anxiety. Patient taking differently: Take 0.125 mg by mouth daily as needed for anxiety. Takes 1/4 of a tablet (0.125 mg) 10/03/18  Yes [name] R, DO  furosemide (LASIX) 40 MG tablet Take 40 mg by mouth daily as needed for fluid or edema.    Yes [provider]  ibuprofen (ADVIL,MOTRIN) 200 MG tablet Take 200-600 mg by mouth 2 (two) times daily as needed for headache or moderate pain.    Yes [provider]  oxyCODONE (ROXICODONE) 15 MG immediate release tablet Take 15 mg by mouth 3 (three) times daily.   Yes [provider]  venlafaxine XR (EFFEXOR-XR) 75 MG 24 hr capsule Take 2 capsules (150 mg  total) by mouth daily. 10/24/18  Yes Donato SchultzLowne Chase, [name], DO  neomycin-bacitracin-polymyxin (NEOSPORIN) ointment Apply 1 application topically daily as needed for wound care.    [provider]  SUMAtriptan (IMITREX) 50 MG tablet TAKE 1 TABLET AT ONSET OF HEADACHE. MAY REPEAT IN 2 HOURS IF NEEDED Patient taking differently: Take 50 mg by mouth every 2 (two) hours as needed for migraine or headache. May repeat dose in 2 hours if no relief.  Do not exceed 2 doses in 24 hours. 11/06/18   Donato SchultzLowne Chase, [name], DO    Family History Family History  Adopted: Yes  Problem Relation Age of Onset  . COPD Mother   . Emphysema Mother   . Heart attack Mother   . Other Mother        tobacco abuse    Social History Social History   Tobacco Use  . Smoking status: Never Smoker  . Smokeless tobacco: Never Used  Substance Use Topics  . Alcohol use: No  . Drug use: No     Allergies   Patient has no known allergies.   Review of Systems Review of Systems  Genitourinary: Positive for  flank pain.  All other systems reviewed and are negative.    Physical Exam Updated Vital Signs BP (!) 152/82 (BP Location: Right Arm)   Pulse 86   Temp 98.4 F (36.9 C) (Oral)   Resp 15   Ht 5\' 5"  (1.651 m)   Wt 62.1 kg   SpO2 97%   BMI 22.80 kg/m   Physical Exam Vitals signs and nursing note reviewed.  Constitutional:      Appearance: She is well-developed.  HENT:     Head: Normocephalic and atraumatic.  Cardiovascular:     Rate and Rhythm: Normal rate and regular rhythm.     Heart sounds: No murmur.  Pulmonary:     Effort: Pulmonary effort is normal. No respiratory distress.     Breath sounds: Normal breath sounds.  Abdominal:     Palpations: Abdomen is soft.     Tenderness: There is no abdominal tenderness. There is no guarding or rebound.  Musculoskeletal:        General: Swelling present. No tenderness.     Comments: Nonpitting edema to BLE.  Stimulator in left low back.   Skin:    General: Skin is warm and dry.     Capillary Refill: Capillary refill takes less than 2 seconds.  Neurological:     Mental Status: She is alert and oriented to person, place, and time.     Comments: Confused.  Generalized weakness  Psychiatric:     Comments: Rambling speech, tangential      ED Treatments / Results  Labs (all labs ordered are listed, but only abnormal results are displayed) Labs Reviewed  COMPREHENSIVE METABOLIC PANEL - Abnormal; Notable for the following components:      Result Value   Potassium 3.2 (*)    BUN 25 (*)    Creatinine, Ser 1.63 (*)    AST 14 (*)    GFR calc non Af Amer 32 (*)    GFR calc Af Amer 37 (*)    All other components within normal limits  CBC - Abnormal; Notable for the following components:   RBC 3.66 (*)    Hemoglobin 10.4 (*)    HCT 33.3 (*)    All other components within normal limits  URINALYSIS, ROUTINE W REFLEX MICROSCOPIC - Abnormal; Notable for the following components:  APPearance HAZY (*)    Protein, ur 100 (*)     Leukocytes, UA MODERATE (*)    WBC, UA >50 (*)    Bacteria, UA RARE (*)    All other components within normal limits  URINE CULTURE  MRSA PCR SCREENING  LIPASE, BLOOD  COMPREHENSIVE METABOLIC PANEL  CBC    EKG None  Radiology Ct Renal Stone Study  Result Date: 12/07/2018 CLINICAL DATA:  Left flank pain.  History of kidney cancer. EXAM: CT ABDOMEN AND PELVIS WITHOUT CONTRAST TECHNIQUE: Multidetector CT imaging of the abdomen and pelvis was performed following the standard protocol without IV contrast. COMPARISON:  11/02/2018 FINDINGS: Lower chest: No acute abnormality. Hepatobiliary: No focal hepatic abnormality. Gallbladder unremarkable. Pancreas: No focal abnormality or ductal dilatation. Spleen: No focal abnormality.  Normal size. Adrenals/Urinary Tract: Moderate to severe left hydronephrosis is similar to prior study. Left ureter cannot be followed. No visible ureteral stones. No hydronephrosis on the right. No adrenal mass. Urinary bladder grossly unremarkable. Stomach/Bowel: Large stool burden throughout the colon. Postoperative changes in the stomach. No evidence of bowel obstruction. Vascular/Lymphatic: Aortic atherosclerosis. No enlarged abdominal or pelvic lymph nodes. Reproductive: Prior hysterectomy.  No adnexal masses. Other: No free fluid or free air. Musculoskeletal: No acute bony abnormality. Severe degenerative disc and facet disease throughout the lumbar spine. Compression fractures diffusely throughout the lumbar spine are stable since prior study. IMPRESSION: Stable moderate to severe left hydronephrosis. Large stool burden in the colon. Electronically Signed   By: Charlett Nose M.D.   On: 12/07/2018 18:45    Procedures Procedures (including critical care time)  Medications Ordered in ED Medications  amitriptyline (ELAVIL) tablet 75 mg (75 mg Oral Given 12/07/18 2301)  clonazePAM (KLONOPIN) disintegrating tablet 0.125 mg (0.125 mg Oral Given 12/07/18 2301)  venlafaxine XR  (EFFEXOR-XR) 24 hr capsule 150 mg (has no administration in time range)  amphetamine-dextroamphetamine (ADDERALL XR) 24 hr capsule 20 mg (has no administration in time range)  heparin injection 5,000 Units (5,000 Units Subcutaneous Given 12/07/18 2301)  0.45 % sodium chloride infusion ( Intravenous New Bag/Given 12/07/18 2306)  ondansetron (ZOFRAN) tablet 4 mg (has no administration in time range)    Or  ondansetron (ZOFRAN) injection 4 mg (has no administration in time range)  morphine 2 MG/ML injection 2 mg (has no administration in time range)  cefTRIAXone (ROCEPHIN) 1 g in sodium chloride 0.9 % 100 mL IVPB (has no administration in time range)  sodium chloride 0.9 % bolus 500 mL (500 mLs Intravenous New Bag/Given 12/07/18 2132)  cefTRIAXone (ROCEPHIN) 1 g in sodium chloride 0.9 % 100 mL IVPB (1 g Intravenous New Bag/Given 12/07/18 2133)  SUMAtriptan (IMITREX) tablet 100 mg (100 mg Oral Given 12/07/18 2301)     Initial Impression / Assessment and Plan / ED Course  I have reviewed the triage vital signs and the nursing notes.  Pertinent labs & imaging results that were available during my care of the patient were reviewed by me and considered in my medical decision making (see chart for details).     Patient with history of ureteral stricture here for evaluation of flank pain, confusion. Labs demonstrate acute kidney injury. UA is concerning for UTI. CT demonstrates persistent left-sided hydronephrosis. She was treated with IV fluids, IV antibiotics. Discussed the case with urology - and she was seen in the emergency department, recommendation for admission for IR placed nephrostomy tube.  Hospitalist consulted for admission.  Patient updated of findings of studies and recommendation for admission  and she is in agreement with  treatment plan.    Final Clinical Impressions(s) / ED Diagnoses   Final diagnoses:  Ureteral stricture, left    ED Discharge Orders    None       Tilden Fossa, MD 12/08/18 [phone]

## 2018-12-07 NOTE — Consult Note (Signed)
Urology Consult Note   Requesting Attending Physician:  Tilden Fossa, MD Service Providing Consult: Urology  Consulting Attending: Wilkie Aye, MD  Reason for Consult:  hydronephrosis  HPI: [name] is seen in consultation for reasons noted above at the request of Tilden Fossa, MD  This is a 73 y.o. female who initially presented in 08/2018 with infected/obstructing left proximal ureteral stone requiring left nephrostomy tube placement. She subsequently underwent a left USE on 11/20 at which time she was noted with left UPJ/proximal ureteral stricture that was unable to be dilated from a retrograde approach. She returned to the OR on 12/13 where she underwent attempted dilation/laser ablation of stricture from an antegrade approach, however this was ultimatley unsuccessful due to the complexity of her stricture. Her nephrostomy tube was replaced at that time.  Patient was subsequently seen in clinic on 12/30 with a CT scan and lasix renogram, at which time it became apparent that her nephrostomy tube had become dislodged. Lasix scan demonstrated 20% function of the left kidney with complete obstruction. Given the prior failed attempts at endoscopic management in the context of the overall poor function of her left kidney, the decision was made to defer attempts at ureteral reconstruction and instead manage conservatively with continued observation with plans to only replace the nephrostomy tube if she were to experience pain or infection.   The patient did not show up for her recently scheduled follow up appointment on 1/20. Her husband called the clinic today to report that she had been hallucinating. It was advised that he take her to the ED for further evaluation. A CT scan was obtained which showed moderate/severe left hydronephrosis. Urology was consulted for assistance with management.  Patient describes left flank pain for the past several days that acutely worsened last night.  She denies fevers/chills, no nausea/vomiting. She does report a fall last night. Her husband reports she has been hallucinating today, stating these are the symptoms that prompted her initial presentation for sepsis in October.    Past Medical History: Past Medical History:  Diagnosis Date  . Abrasion of right heel    during admission 10/ 2019 right heel open skin due to movement on sheet  . ADD (attention deficit disorder)   . Anemia, mild   . Anxiety   . Arthritis    "joints, back"  . At high risk for falls    10-19-2018 pt has fell twice in past week, tripped  . Chronic back pain    "all over my back" (10/02/2013)  . Crushing injury of arm, right 02/06/1989's   "it was crushed; wore cast from fingers to top of my shoulder" (11/25/2  . Crushing injury of left wrist 05/11/1989's  . DOE (dyspnea on exertion)   . Essential tremor   . Fibromyalgia   . History of palpitations 2002   recurrent  . History of panic attacks   . History of pneumonia 08/21/2018   w/ acute respiratory failure/ hypoxia  . History of sepsis    admission 08-21-2018  secondary to uropathy obstructive from ureteral stone   . Hyperlipidemia   . Hypertension    10-19-2018 PER PT AMLODIPINE ON HOLD SINCE 10/ 2019 DUE TO KIDNEY ISSUES PER DOCTOR  . Idiopathic scoliosis 04/19/2013  . Left ureteral stone   . Migraines    "once/month" (10/02/2013)  . Osteoporosis   . Other vitamin B12 deficiency anemia   . PONV (postoperative nausea and vomiting)   . Pulmonary nodules/lesions, multiple   .  Renal insufficiency   . S/P gastric bypass 09/16/2003  . S/P insertion of spinal cord stimulator    per pt remote is missing  . Wears dentures    upper  . Wears glasses   . Wears glasses     Past Surgical History:  Past Surgical History:  Procedure Laterality Date  . ABDOMINAL HYSTERECTOMY  1980's   W/  BSO  AND APPENDECTOMY  . CARDIAC CATHETERIZATION  12-13-2002    dr Eden Emms   normal coronaries  . CYSTOSCOPY WITH  URETEROSCOPY AND STENT PLACEMENT Left 09/27/2018   Procedure: LEFT  URETEROSCOPY, HOLMIUM LASER LITHOTRIPSY;  Surgeon: Crist Fat, MD;  Location: [facility];  Service: Urology;  Laterality: Left;  . D & C HYSTERSCOPY RESECTION MYOMECTOMY  1980s  . IR NEPHROSTOMY PLACEMENT LEFT  08/22/2018  . KNEE ARTHROSCOPY Right 03-31-2001    dr Simonne Come @[facility]   . NEPHROLITHOTOMY Left 10/20/2018   Procedure: LEFT NEPHROLITHOTOMY PERCUTANEOUS;  Surgeon: Crist Fat, MD;  Location: [facility];  Service: Urology;  Laterality: Left;  . REDUCTION MAMMAPLASTY Bilateral 2001  . ROUX-EN-Y GASTRIC BYPASS  09-16-2003   dr Judie Petit. Daphine Deutscher  @[facility]    via Laparoscopy w/ gastrojejunostomy  . SPINAL CORD STIMULATOR IMPLANT  2016   10-19-2018  PER PT HAS REMOTE BUT HAS NOT USED IT SINCE DEC 2018  . TONSILLECTOMY AND ADENOIDECTOMY  1955  . TUBAL LIGATION  1980's    Medication: Current Facility-Administered Medications  Medication Dose Route Frequency Provider Last Rate Last Dose  . cefTRIAXone (ROCEPHIN) 1 g in sodium chloride 0.9 % 100 mL IVPB  1 g Intravenous Once Tilden Fossa, MD      . sodium chloride 0.9 % bolus 500 mL  500 mL Intravenous Once Tilden Fossa, MD       Current Outpatient Medications  Medication Sig Dispense Refill  . amitriptyline (ELAVIL) 75 MG tablet Take 75 mg by mouth at bedtime.    [name] amphetamine-dextroamphetamine (ADDERALL XR) 20 MG 24 hr capsule TAKE TWO CAPSULE BY MOUTH EVERY MORNING 60 capsule 0  . clonazePAM (KLONOPIN) 0.5 MG tablet Take 0.5 tablets (0.25 mg total) by mouth daily as needed for anxiety. (Patient taking differently: Take 0.125 mg by mouth daily as needed for anxiety. Takes 1/4 of a tablet (0.125 mg)) 30 tablet 1  . furosemide (LASIX) 40 MG tablet Take 40 mg by mouth daily as needed for fluid or edema.     [name] ibuprofen (ADVIL,MOTRIN) 200 MG tablet Take 200-600 mg by mouth 2 (two) times daily as needed for headache or moderate pain.     [name] oxyCODONE (ROXICODONE) 15 MG  immediate release tablet Take 15 mg by mouth 3 (three) times daily.    [name] venlafaxine XR (EFFEXOR-XR) 75 MG 24 hr capsule Take 2 capsules (150 mg total) by mouth daily. 180 capsule 3  . neomycin-bacitracin-polymyxin (NEOSPORIN) ointment Apply 1 application topically daily as needed for wound care.    . SUMAtriptan (IMITREX) 50 MG tablet TAKE 1 TABLET AT ONSET OF HEADACHE. MAY REPEAT IN 2 HOURS IF NEEDED (Patient taking differently: Take 50 mg by mouth every 2 (two) hours as needed for migraine or headache. May repeat dose in 2 hours if no relief.  Do not exceed 2 doses in 24 hours.) 10 tablet 1    Allergies: No Known Allergies  Social History: Social History   Tobacco Use  . Smoking status: Never Smoker  . Smokeless tobacco: Never Used  Substance Use Topics  . Alcohol use: No  .  Drug use: No    Family History Family History  Adopted: Yes  Problem Relation Age of Onset  . COPD Mother   . Emphysema Mother   . Heart attack Mother   . Other Mother        tobacco abuse    Review of Systems 10 systems were reviewed and are negative except as noted specifically in the HPI.  Objective   Vital signs in last 24 hours: BP (!) 146/86 (BP Location: Left Arm)   Pulse 82   Temp 98.4 F (36.9 C) (Oral)   Resp 17   Wt 62.1 kg   SpO2 100%   BMI 22.80 kg/m   Physical Exam General: NAD, A&O, resting, appropriate HEENT: [place]/AT, EOMI, MMM Pulmonary: Normal work of breathing Cardiovascular: HDS, adequate peripheral perfusion Abdomen: Soft, NTTP, nondistended GU: Left CVA tenderness Extremities: warm and well perfused Neuro: Appropriate, no focal neurological deficits  Most Recent Labs: Lab Results  Component Value Date   WBC 6.0 12/07/2018   HGB 10.4 (L) 12/07/2018   HCT 33.3 (L) 12/07/2018   PLT 186 12/07/2018    Lab Results  Component Value Date   NA 135 12/07/2018   K 3.2 (L) 12/07/2018   CL 100 12/07/2018   CO2 24 12/07/2018   BUN 25 (H) 12/07/2018   CREATININE  1.63 (H) 12/07/2018   CALCIUM 9.1 12/07/2018   MG 1.9 09/01/2018   PHOS 3.2 08/26/2018    Lab Results  Component Value Date   INR 1.24 09/01/2018   APTT 57 (H) 08/26/2018     IMAGING: Ct Renal Stone Study  Result Date: 12/07/2018 CLINICAL DATA:  Left flank pain.  History of kidney cancer. EXAM: CT ABDOMEN AND PELVIS WITHOUT CONTRAST TECHNIQUE: Multidetector CT imaging of the abdomen and pelvis was performed following the standard protocol without IV contrast. COMPARISON:  11/02/2018 FINDINGS: Lower chest: No acute abnormality. Hepatobiliary: No focal hepatic abnormality. Gallbladder unremarkable. Pancreas: No focal abnormality or ductal dilatation. Spleen: No focal abnormality.  Normal size. Adrenals/Urinary Tract: Moderate to severe left hydronephrosis is similar to prior study. Left ureter cannot be followed. No visible ureteral stones. No hydronephrosis on the right. No adrenal mass. Urinary bladder grossly unremarkable. Stomach/Bowel: Large stool burden throughout the colon. Postoperative changes in the stomach. No evidence of bowel obstruction. Vascular/Lymphatic: Aortic atherosclerosis. No enlarged abdominal or pelvic lymph nodes. Reproductive: Prior hysterectomy.  No adnexal masses. Other: No free fluid or free air. Musculoskeletal: No acute bony abnormality. Severe degenerative disc and facet disease throughout the lumbar spine. Compression fractures diffusely throughout the lumbar spine are stable since prior study. IMPRESSION: Stable moderate to severe left hydronephrosis. Large stool burden in the colon. Electronically Signed   By: Charlett NoseKevin  Dover M.D.   On: 12/07/2018 18:45    ------  Assessment:  73 y.o. female with poorly functioning/non-draining left kidney secondary to UPJ/proximal ureteral stricture/obliteration with recent unsuccessful attempts at endoscopic management.  Patient presents today with AMS and left flank pain after left nephrostomy tube became dislodged  approximately 1 month ago. CT scan today shows moderate/severe left hydronephrosis in setting of known left UPJ/proximal ureteral stricture/obliteration. Patient is currently AF and HDS. Non-toxic appearing at this time. No leukocytosis. UA with negative nitrites, moderate LE, >50 WBCs and rare bacteria. Cr elevated to 1.63 from a normal baseline. She has mild left CVA tenderness on exam.   Recommendations: - Agree with admission to medicine service for IV antibiotics, agree with use of ceftriaxone based on prior culture sensitivities -  Patient will need left nephrotomy tube replaced tomorrow with interventional radiology, order has been placed in Epic. If patient were to show signs/symptoms of systemic infection overnight, this procedure should be performed emergently - Please make NPO midnight - Urology will continue to follow  Thank you for this consult. Please contact the urology consult pager with any further questions/concerns.

## 2018-12-07 NOTE — ED Triage Notes (Signed)
Pt having left side pains and hallucinating since Saturday. Pt hx sepsis from UTI due to blocked kidney stone. Husband reports still has a blocked kidney. Pt has urostomy when had the blocked kidney that was removed couple weeks ago

## 2018-12-07 NOTE — ED Notes (Signed)
ED TO INPATIENT HANDOFF REPORT  Name/Age/Gender [name] 73 y.o. female  Code Status Code Status History    Date Active Date Inactive Code Status Order ID Comments User Context   08/21/2018 1609 09/01/2018 2302 Full Code [id number]  Norton Blizzard, NP ED   03/07/2015 0018 03/08/2015 1652 Full Code [id number]  Lorretta Harp, MD Inpatient   10/02/2013 1803 10/04/2013 1547 Full Code [id number]  Maretta Bees, MD Inpatient      Home/SNF/Other Home  Chief Complaint blocked kidney, hallucinations  Level of Care/Admitting Diagnosis ED Disposition    ED Disposition Condition Comment   Admit  Hospital Area: [facility] [[id number]]  Level of Care: Med-Surg [16]  Diagnosis: Delirium [240973]  Admitting Physician: Rometta Emery [[id number]]  Attending Physician: Rometta Emery [[id number]]  Estimated length of stay: past midnight tomorrow  Certification:: I certify this patient will need inpatient services for at least 2 midnights  PT Class (Do Not Modify): Inpatient [101]  PT Acc Code (Do Not Modify): Private [1]       Medical History Past Medical History:  Diagnosis Date  . Abrasion of right heel    during admission 10/ 2019 right heel open skin due to movement on sheet  . ADD (attention deficit disorder)   . Anemia, mild   . Anxiety   . Arthritis    "joints, back"  . At high risk for falls    10-19-2018 pt has fell twice in past week, tripped  . Chronic back pain    "all over my back" (10/02/2013)  . Crushing injury of arm, right 02/06/1989's   "it was crushed; wore cast from fingers to top of my shoulder" (11/25/2  . Crushing injury of left wrist 05/11/1989's  . DOE (dyspnea on exertion)   . Essential tremor   . Fibromyalgia   . History of palpitations 2002   recurrent  . History of panic attacks   . History of pneumonia 08/21/2018   w/ acute respiratory failure/ hypoxia  . History of sepsis    admission 08-21-2018  secondary to uropathy obstructive  from ureteral stone   . Hyperlipidemia   . Hypertension    10-19-2018 PER PT AMLODIPINE ON HOLD SINCE 10/ 2019 DUE TO KIDNEY ISSUES PER DOCTOR  . Idiopathic scoliosis 04/19/2013  . Left ureteral stone   . Migraines    "once/month" (10/02/2013)  . Osteoporosis   . Other vitamin B12 deficiency anemia   . PONV (postoperative nausea and vomiting)   . Pulmonary nodules/lesions, multiple   . Renal insufficiency   . S/P gastric bypass 09/16/2003  . S/P insertion of spinal cord stimulator    per pt remote is missing  . Wears dentures    upper  . Wears glasses   . Wears glasses     Allergies No Known Allergies  IV Location/Drains/Wounds Patient Lines/Drains/Airways Status   Active Line/Drains/Airways    Name:   Placement date:   Placement time:   Site:   Days:   Peripheral IV 12/07/18 Right Wrist   12/07/18    2131    Wrist   less than 1   Nephrostomy Left 14 Fr.   10/20/18    1135    Left   48   Incision (Closed) 10/20/18 Back Left   10/20/18    1140     48          Labs/Imaging Results for orders placed or performed during the hospital encounter of  12/07/18 (from the past 48 hour(s))  Lipase, blood     Status: None   Collection Time: 12/07/18  4:24 PM  Result Value Ref Range   Lipase 25 11 - 51 U/L    Comment: Performed at [facility], [address] [place], [place] [address]  Comprehensive metabolic panel     Status: Abnormal   Collection Time: 12/07/18  4:24 PM  Result Value Ref Range   Sodium 135 135 - 145 mmol/L   Potassium 3.2 (L) 3.5 - 5.1 mmol/L   Chloride 100 98 - 111 mmol/L   CO2 24 22 - 32 mmol/L   Glucose, Bld 97 70 - 99 mg/dL   BUN 25 (H) 8 - 23 mg/dL   Creatinine, Ser 6.041.63 (H) 0.44 - 1.00 mg/dL   Calcium 9.1 8.9 - 54.010.3 mg/dL   Total Protein 7.5 6.5 - 8.1 g/dL   Albumin 3.6 3.5 - 5.0 g/dL   AST 14 (L) 15 - 41 U/L   ALT 7 0 - 44 U/L   Alkaline Phosphatase 66 38 - 126 U/L   Total Bilirubin 1.0 0.3 - 1.2 mg/dL   GFR calc non Af Amer  32 (L) >60 mL/min   GFR calc Af Amer 37 (L) >60 mL/min   Anion gap 11 5 - 15    Comment: Performed at [facility], [address] [place], [place] [address]  CBC     Status: Abnormal   Collection Time: 12/07/18  4:24 PM  Result Value Ref Range   WBC 6.0 4.0 - 10.5 K/uL   RBC 3.66 (L) 3.87 - 5.11 MIL/uL   Hemoglobin 10.4 (L) 12.0 - 15.0 g/dL   HCT 14.733.3 (L) 82.936.0 - 56.246.0 %   MCV 91.0 80.0 - 100.0 fL   MCH 28.4 26.0 - 34.0 pg   MCHC 31.2 30.0 - 36.0 g/dL   RDW 13.013.4 86.511.5 - 78.415.5 %   Platelets 186 150 - 400 K/uL   nRBC 0.0 0.0 - 0.2 %    Comment: Performed at [facility], [address] [place], [place] [address]  Urinalysis, Routine w reflex microscopic     Status: Abnormal   Collection Time: 12/07/18  7:08 PM  Result Value Ref Range   Color, Urine YELLOW YELLOW   APPearance HAZY (A) CLEAR   Specific Gravity, Urine 1.024 1.005 - 1.030   pH 5.0 5.0 - 8.0   Glucose, UA NEGATIVE NEGATIVE mg/dL   Hgb urine dipstick NEGATIVE NEGATIVE   Bilirubin Urine NEGATIVE NEGATIVE   Ketones, ur NEGATIVE NEGATIVE mg/dL   Protein, ur 528100 (A) NEGATIVE mg/dL   Nitrite NEGATIVE NEGATIVE   Leukocytes, UA MODERATE (A) NEGATIVE   RBC / HPF 0-5 0 - 5 RBC/hpf   WBC, UA >50 (H) 0 - 5 WBC/hpf   Bacteria, UA RARE (A) NONE SEEN   Squamous Epithelial / LPF 0-5 0 - 5   Mucus PRESENT    Hyaline Casts, UA PRESENT     Comment: Performed at [facility], [address] [place], [place] [address]   Ct Renal Stone Study  Result Date: 12/07/2018 CLINICAL DATA:  Left flank pain.  History of kidney cancer. EXAM: CT ABDOMEN AND PELVIS WITHOUT CONTRAST TECHNIQUE: Multidetector CT imaging of the abdomen and pelvis was performed following the standard protocol without IV contrast. COMPARISON:  11/02/2018 FINDINGS: Lower chest: No acute abnormality. Hepatobiliary: No focal hepatic abnormality. Gallbladder unremarkable. Pancreas: No focal abnormality or ductal  dilatation. Spleen: No focal abnormality.  Normal size. Adrenals/Urinary Tract: Moderate to severe left hydronephrosis is similar to prior study. Left ureter cannot be followed. No visible ureteral stones. No hydronephrosis on the right. No adrenal mass. Urinary bladder grossly unremarkable. Stomach/Bowel: Large stool burden throughout the colon. Postoperative changes in the stomach. No evidence of bowel obstruction. Vascular/Lymphatic: Aortic atherosclerosis. No enlarged abdominal or pelvic lymph nodes. Reproductive: Prior hysterectomy.  No adnexal masses. Other: No free fluid or free air. Musculoskeletal: No acute bony abnormality. Severe degenerative disc and facet disease throughout the lumbar spine. Compression fractures diffusely throughout the lumbar spine are stable since prior study. IMPRESSION: Stable moderate to severe left hydronephrosis. Large stool burden in the colon. Electronically Signed   By: Charlett NoseKevin  Dover M.D.   On: 12/07/2018 18:45   None  Pending Labs Unresulted Labs (From admission, onward)    Start     Ordered   12/07/18 1811  Urine culture  ONCE - STAT,   STAT     12/07/18 1810   Signed and Held  CBC  (heparin)  Once,   R    Comments:  Baseline for heparin therapy IF NOT ALREADY DRAWN.  Notify MD if PLT < 100 K.    Signed and Held   Signed and Held  Creatinine, serum  (heparin)  Once,   R    Comments:  Baseline for heparin therapy IF NOT ALREADY DRAWN.    Signed and Held   Signed and Held  Comprehensive metabolic panel  Tomorrow morning,   R     Signed and Held   Signed and Held  CBC  Tomorrow morning,   R     Signed and Held          Vitals/Pain Today's Vitals   12/07/18 1547 12/07/18 1905 12/07/18 2148  BP: 131/79 (!) 146/86 (!) 143/93  Pulse: 73 82 76  Resp: 18 17 17   Temp: 98.4 F (36.9 C)    TempSrc: Oral    SpO2: 100% 100% 100%  Weight: 62.1 kg      Isolation Precautions No active isolations  Medications Medications  sodium chloride 0.9 % bolus  500 mL (500 mLs Intravenous New Bag/Given 12/07/18 2132)  cefTRIAXone (ROCEPHIN) 1 g in sodium chloride 0.9 % 100 mL IVPB (1 g Intravenous New Bag/Given 12/07/18 2133)    Mobility walks with person assist

## 2018-12-07 NOTE — H&P (Signed)
History and Physical   [name]:[id number] DOB: 24-Nov-1945 DOA: 12/07/2018  Referring MD/NP/PA: Dr Madilyn Hook  PCP: Zola Button, Grayling Congress, DO   Outpatient Specialists: Dr Berniece Salines, Urology   Patient coming from: home  Chief Complaint: Confusion and left flank pain  HPI: [name] is a 73 y.o. female with medical history significant of left ureteric stricture diagnosed in December last year, previous left ureteric stone with hydronephrosis, previous attempted laser ablation and dilation of stricture which was unsuccessful, history of ADD, hypertension, fibromyalgia, anxiety disorder, essential tremors who came to the ER today with hallucinations and generalized confusion.  Patient has had previous nephrostomy tube placed due to ureteric stricture.  This has failed at that point.  She was on conservative management due to failed endoscopic management by urology.  She started experiencing confusion and hallucinations today.  Her husband called the urology clinic where they were referred to the ER.  In the ER patient is evaluated and found to have mild delirium which is improving.  There was evidence of UTI from urinalysis and CT kidney stone protocol showed persistent left hydronephrosis.  Patient has been seen by urology and recommended admission for management of her UTI which seems complex as well as left hydronephrosis..  ED Course: Temperature is 98 for blood pressure 146/86 pulse 82 respirate of 18 oxygen sat 100% room air.  White count 6.0 hemoglobin is 10.4 with platelet 186.  Sodium 135 potassium 3.2 with BUN 25 creatinine 1.63 calcium 9.1 glucose 97.  CT renal stone shows stable moderate to severe left hydronephrosis with large stool burden in the colon.  Review of Systems: As per HPI otherwise 10 point review of systems negative.    Past Medical History:  Diagnosis Date  . Abrasion of right heel    during admission 10/ 2019 right heel open skin due to movement on sheet    . ADD (attention deficit disorder)   . Anemia, mild   . Anxiety   . Arthritis    "joints, back"  . At high risk for falls    10-19-2018 pt has fell twice in past week, tripped  . Chronic back pain    "all over my back" (10/02/2013)  . Crushing injury of arm, right 02/06/1989's   "it was crushed; wore cast from fingers to top of my shoulder" (11/25/2  . Crushing injury of left wrist 05/11/1989's  . DOE (dyspnea on exertion)   . Essential tremor   . Fibromyalgia   . History of palpitations 2002   recurrent  . History of panic attacks   . History of pneumonia 08/21/2018   w/ acute respiratory failure/ hypoxia  . History of sepsis    admission 08-21-2018  secondary to uropathy obstructive from ureteral stone   . Hyperlipidemia   . Hypertension    10-19-2018 PER PT AMLODIPINE ON HOLD SINCE 10/ 2019 DUE TO KIDNEY ISSUES PER DOCTOR  . Idiopathic scoliosis 04/19/2013  . Left ureteral stone   . Migraines    "once/month" (10/02/2013)  . Osteoporosis   . Other vitamin B12 deficiency anemia   . PONV (postoperative nausea and vomiting)   . Pulmonary nodules/lesions, multiple   . Renal insufficiency   . S/P gastric bypass 09/16/2003  . S/P insertion of spinal cord stimulator    per pt remote is missing  . Wears dentures    upper  . Wears glasses   . Wears glasses     Past Surgical History:  Procedure  Laterality Date  . ABDOMINAL HYSTERECTOMY  1980's   W/  BSO  AND APPENDECTOMY  . CARDIAC CATHETERIZATION  12-13-2002    dr Eden Emmsnishan   normal coronaries  . CYSTOSCOPY WITH URETEROSCOPY AND STENT PLACEMENT Left 09/27/2018   Procedure: LEFT  URETEROSCOPY, HOLMIUM LASER LITHOTRIPSY;  Surgeon: Crist FatHerrick, Benjamin W, MD;  Location: [facility];  Service: Urology;  Laterality: Left;  . D & C HYSTERSCOPY RESECTION MYOMECTOMY  1980s  . IR NEPHROSTOMY PLACEMENT LEFT  08/22/2018  . KNEE ARTHROSCOPY Right 03-31-2001    dr Simonne Comeaplington @[facility]   . NEPHROLITHOTOMY Left 10/20/2018   Procedure: LEFT NEPHROLITHOTOMY  PERCUTANEOUS;  Surgeon: Crist FatHerrick, Benjamin W, MD;  Location: [facility];  Service: Urology;  Laterality: Left;  . REDUCTION MAMMAPLASTY Bilateral 2001  . ROUX-EN-Y GASTRIC BYPASS  09-16-2003   dr Judie Petitm. Daphine Deutschermartin  @[facility]    via Laparoscopy w/ gastrojejunostomy  . SPINAL CORD STIMULATOR IMPLANT  2016   10-19-2018  PER PT HAS REMOTE BUT HAS NOT USED IT SINCE DEC 2018  . TONSILLECTOMY AND ADENOIDECTOMY  1955  . TUBAL LIGATION  1980's     reports that she has never smoked. She has never used smokeless tobacco. She reports that she does not drink alcohol or use drugs.  No Known Allergies  Family History  Adopted: Yes  Problem Relation Age of Onset  . COPD Mother   . Emphysema Mother   . Heart attack Mother   . Other Mother        tobacco abuse     Prior to Admission medications   Medication Sig Start Date End Date Taking? Authorizing Provider  amitriptyline (ELAVIL) 75 MG tablet Take 75 mg by mouth at bedtime.   Yes [provider]  amphetamine-dextroamphetamine (ADDERALL XR) 20 MG 24 hr capsule TAKE TWO CAPSULE BY MOUTH EVERY MORNING 11/28/18  Yes Seabron SpatesLowne Chase, [name], DO  clonazePAM (KLONOPIN) 0.5 MG tablet Take 0.5 tablets (0.25 mg total) by mouth daily as needed for anxiety. Patient taking differently: Take 0.125 mg by mouth daily as needed for anxiety. Takes 1/4 of a tablet (0.125 mg) 10/03/18  Yes Seabron SpatesLowne Chase, [name], DO  furosemide (LASIX) 40 MG tablet Take 40 mg by mouth daily as needed for fluid or edema.    Yes [provider]  ibuprofen (ADVIL,MOTRIN) 200 MG tablet Take 200-600 mg by mouth 2 (two) times daily as needed for headache or moderate pain.    Yes [provider]  oxyCODONE (ROXICODONE) 15 MG immediate release tablet Take 15 mg by mouth 3 (three) times daily.   Yes [provider]  venlafaxine XR (EFFEXOR-XR) 75 MG 24 hr capsule Take 2 capsules (150 mg total) by mouth daily. 10/24/18  Yes Donato SchultzLowne Chase, [name], DO    neomycin-bacitracin-polymyxin (NEOSPORIN) ointment Apply 1 application topically daily as needed for wound care.    [provider]  SUMAtriptan (IMITREX) 50 MG tablet TAKE 1 TABLET AT ONSET OF HEADACHE. MAY REPEAT IN 2 HOURS IF NEEDED Patient taking differently: Take 50 mg by mouth every 2 (two) hours as needed for migraine or headache. May repeat dose in 2 hours if no relief.  Do not exceed 2 doses in 24 hours. 11/06/18   Donato SchultzLowne Chase, [name], DO    Physical Exam: Vitals:   12/07/18 1547 12/07/18 1905  BP: 131/79 (!) 146/86  Pulse: 73 82  Resp: 18 17  Temp: 98.4 F (36.9 C)   TempSrc: Oral   SpO2: 100% 100%  Weight: 62.1  kg       Constitutional: NAD, calm, comfortable, slightly confused Vitals:   12/07/18 1547 12/07/18 1905  BP: 131/79 (!) 146/86  Pulse: 73 82  Resp: 18 17  Temp: 98.4 F (36.9 C)   TempSrc: Oral   SpO2: 100% 100%  Weight: 62.1 kg    Eyes: PERRL, lids and conjunctivae normal ENMT: Mucous membranes are moist. Posterior pharynx clear of any exudate or lesions.Normal dentition.  Neck: normal, supple, no masses, no thyromegaly Respiratory: clear to auscultation bilaterally, no wheezing, no crackles. Normal respiratory effort. No accessory muscle use.  Cardiovascular: Regular rate and rhythm, no murmurs / rubs / gallops. No extremity edema. 2+ pedal pulses. No carotid bruits.  Abdomen: no tenderness, no masses palpated. No hepatosplenomegaly. Bowel sounds positive.  Musculoskeletal: no clubbing / cyanosis. No joint deformity upper and lower extremities. Good ROM, no contractures. Normal muscle tone.  Skin: no rashes, lesions, ulcers. No induration Neurologic: CN 2-12 grossly intact. Sensation intact, DTR normal. Strength 5/5 in all 4.  Psychiatric: Normal judgment and insight. Alert and oriented x 3. Normal mood.     Labs on Admission: I have personally reviewed following labs and imaging studies  CBC: Recent Labs  Lab 12/07/18 1624  WBC 6.0   HGB 10.4*  HCT 33.3*  MCV 91.0  PLT 186   Basic Metabolic Panel: Recent Labs  Lab 12/07/18 1624  NA 135  K 3.2*  CL 100  CO2 24  GLUCOSE 97  BUN 25*  CREATININE 1.63*  CALCIUM 9.1   GFR: Estimated Creatinine Clearance: 28.9 mL/min (A) (by C-G formula based on SCr of 1.63 mg/dL (H)). Liver Function Tests: Recent Labs  Lab 12/07/18 1624  AST 14*  ALT 7  ALKPHOS 66  BILITOT 1.0  PROT 7.5  ALBUMIN 3.6   Recent Labs  Lab 12/07/18 1624  LIPASE 25   No results for input(s): AMMONIA in the last 168 hours. Coagulation Profile: No results for input(s): INR, PROTIME in the last 168 hours. Cardiac Enzymes: No results for input(s): CKTOTAL, CKMB, CKMBINDEX, TROPONINI in the last 168 hours. BNP (last 3 results) No results for input(s): PROBNP in the last 8760 hours. HbA1C: No results for input(s): HGBA1C in the last 72 hours. CBG: No results for input(s): GLUCAP in the last 168 hours. Lipid Profile: No results for input(s): CHOL, HDL, LDLCALC, TRIG, CHOLHDL, LDLDIRECT in the last 72 hours. Thyroid Function Tests: No results for input(s): TSH, T4TOTAL, FREET4, T3FREE, THYROIDAB in the last 72 hours. Anemia Panel: No results for input(s): VITAMINB12, FOLATE, FERRITIN, TIBC, IRON, RETICCTPCT in the last 72 hours. Urine analysis:    Component Value Date/Time   COLORURINE YELLOW 12/07/2018 1908   APPEARANCEUR HAZY (A) 12/07/2018 1908   LABSPEC 1.024 12/07/2018 1908   PHURINE 5.0 12/07/2018 1908   GLUCOSEU NEGATIVE 12/07/2018 1908   HGBUR NEGATIVE 12/07/2018 1908   HGBUR negative 10/28/2009 0000   BILIRUBINUR NEGATIVE 12/07/2018 1908   BILIRUBINUR negative 03/24/2015 1615   KETONESUR NEGATIVE 12/07/2018 1908   PROTEINUR 100 (A) 12/07/2018 1908   UROBILINOGEN 2.0 03/24/2015 1615   UROBILINOGEN 1.0 03/06/2015 2030   NITRITE NEGATIVE 12/07/2018 1908   LEUKOCYTESUR MODERATE (A) 12/07/2018 1908   Sepsis Labs: @LABRCNTIP (procalcitonin:4,lacticidven:4) )No results  found for this or any previous visit (from the past 240 hour(s)).   Radiological Exams on Admission: Ct Renal Stone Study  Result Date: 12/07/2018 CLINICAL DATA:  Left flank pain.  History of kidney cancer. EXAM: CT ABDOMEN AND PELVIS WITHOUT CONTRAST TECHNIQUE:  Multidetector CT imaging of the abdomen and pelvis was performed following the standard protocol without IV contrast. COMPARISON:  11/02/2018 FINDINGS: Lower chest: No acute abnormality. Hepatobiliary: No focal hepatic abnormality. Gallbladder unremarkable. Pancreas: No focal abnormality or ductal dilatation. Spleen: No focal abnormality.  Normal size. Adrenals/Urinary Tract: Moderate to severe left hydronephrosis is similar to prior study. Left ureter cannot be followed. No visible ureteral stones. No hydronephrosis on the right. No adrenal mass. Urinary bladder grossly unremarkable. Stomach/Bowel: Large stool burden throughout the colon. Postoperative changes in the stomach. No evidence of bowel obstruction. Vascular/Lymphatic: Aortic atherosclerosis. No enlarged abdominal or pelvic lymph nodes. Reproductive: Prior hysterectomy.  No adnexal masses. Other: No free fluid or free air. Musculoskeletal: No acute bony abnormality. Severe degenerative disc and facet disease throughout the lumbar spine. Compression fractures diffusely throughout the lumbar spine are stable since prior study. IMPRESSION: Stable moderate to severe left hydronephrosis. Large stool burden in the colon. Electronically Signed   By: Charlett Nose M.D.   On: 12/07/2018 18:45     Assessment/Plan Active Problems:   Hyperlipidemia   GERD   Essential hypertension   Sepsis (HCC)   Delirium   Hydronephrosis of left kidney   Pyelonephritis     #1 left hydronephrosis: Most likely cause of delirium.  Patient has failed previous attempts at endoscopic intervention.  She has left ureteric stricture which could be the cause.  Recommendation from urology is to reinsert nephrostomy  tube by IR.  We will admit the patient stabilized the patient and get IR consultation.  #2 acute delirium: Most likely due to UTI from left kidney stricture.  Continue supportive care.  Patient is more awake now.  #3 acute pyelonephritis: Based on her urinalysis.  She has large to moderate leukocyte Estrace and bacteria.  IV Rocephin initiated will continue.  Await urine culture sensitivity results.  #4 hypertension: Blood pressure at this point is stable.  Continue home regimen and monitor.  #5 hyperlipidemia: Continue with statin.  #6 GERD: Continue with PPIs.  #7 hypokalemia: Replete potassium   DVT prophylaxis: Heparin Code Status: Full code Family Communication: Husband at bedside Disposition Plan: To be determined Consults called: Urology Admission status: Inpatient  Severity of Illness: The appropriate patient status for this patient is INPATIENT. Inpatient status is judged to be reasonable and necessary in order to provide the required intensity of service to ensure the patient's safety. The patient's presenting symptoms, physical exam findings, and initial radiographic and laboratory data in the context of their chronic comorbidities is felt to place them at high risk for further clinical deterioration. Furthermore, it is not anticipated that the patient will be medically stable for discharge from the hospital within 2 midnights of admission. The following factors support the patient status of inpatient.   " The patient's presenting symptoms include flank pain and nausea. " The worrisome physical exam findings include CVA tenderness. " The initial radiographic and laboratory data are worrisome because of evidence of UTI. " The chronic co-morbidities include ureteric stricture.   * I certify that at the point of admission it is my clinical judgment that the patient will require inpatient hospital care spanning beyond 2 midnights from the point of admission due to high intensity  of service, high risk for further deterioration and high frequency of surveillance required.Lonia Blood MD [facility] Pager [phone]  If 7PM-7AM, please contact night-coverage [email or web address] Password [email or web address]  12/07/2018, 9:45 PM

## 2018-12-07 NOTE — ED Notes (Signed)
Attempted IV x2. 

## 2018-12-07 NOTE — ED Notes (Signed)
Patient in bathroom attempting to provide urine sample at this time.

## 2018-12-08 ENCOUNTER — Encounter (HOSPITAL_COMMUNITY): Payer: Self-pay | Admitting: Radiology

## 2018-12-08 ENCOUNTER — Inpatient Hospital Stay (HOSPITAL_COMMUNITY): Payer: Medicare Other

## 2018-12-08 ENCOUNTER — Telehealth: Payer: Self-pay | Admitting: Cardiology

## 2018-12-08 DIAGNOSIS — K219 Gastro-esophageal reflux disease without esophagitis: Secondary | ICD-10-CM

## 2018-12-08 DIAGNOSIS — E440 Moderate protein-calorie malnutrition: Secondary | ICD-10-CM

## 2018-12-08 DIAGNOSIS — R6520 Severe sepsis without septic shock: Secondary | ICD-10-CM

## 2018-12-08 DIAGNOSIS — K210 Gastro-esophageal reflux disease with esophagitis: Secondary | ICD-10-CM

## 2018-12-08 DIAGNOSIS — G9341 Metabolic encephalopathy: Secondary | ICD-10-CM

## 2018-12-08 DIAGNOSIS — A4151 Sepsis due to Escherichia coli [E. coli]: Secondary | ICD-10-CM

## 2018-12-08 DIAGNOSIS — E785 Hyperlipidemia, unspecified: Secondary | ICD-10-CM

## 2018-12-08 HISTORY — PX: IR NEPHROSTOMY PLACEMENT LEFT: IMG6063

## 2018-12-08 LAB — COMPREHENSIVE METABOLIC PANEL
ALK PHOS: 66 U/L (ref 38–126)
ALT: 5 U/L (ref 0–44)
ANION GAP: 9 (ref 5–15)
AST: 11 U/L — ABNORMAL LOW (ref 15–41)
Albumin: 2.9 g/dL — ABNORMAL LOW (ref 3.5–5.0)
BUN: 19 mg/dL (ref 8–23)
CO2: 23 mmol/L (ref 22–32)
Calcium: 8.7 mg/dL — ABNORMAL LOW (ref 8.9–10.3)
Chloride: 105 mmol/L (ref 98–111)
Creatinine, Ser: 1.33 mg/dL — ABNORMAL HIGH (ref 0.44–1.00)
GFR calc Af Amer: 47 mL/min — ABNORMAL LOW (ref >60)
GFR calc non Af Amer: 40 mL/min — ABNORMAL LOW (ref >60)
Glucose, Bld: 90 mg/dL (ref 70–99)
Potassium: 3.4 mmol/L — ABNORMAL LOW (ref 3.5–5.1)
SODIUM: 137 mmol/L (ref 135–145)
Total Bilirubin: 0.5 mg/dL (ref 0.3–1.2)
Total Protein: 6.5 g/dL (ref 6.5–8.1)

## 2018-12-08 LAB — CBC
HCT: 33.7 % — ABNORMAL LOW (ref 36.0–46.0)
Hemoglobin: 10.6 g/dL — ABNORMAL LOW (ref 12.0–15.0)
MCH: 28.5 pg (ref 26.0–34.0)
MCHC: 31.5 g/dL (ref 30.0–36.0)
MCV: 90.6 fL (ref 80.0–100.0)
NRBC: 0 % (ref 0.0–0.2)
Platelets: 225 10*3/uL (ref 150–400)
RBC: 3.72 MIL/uL — ABNORMAL LOW (ref 3.87–5.11)
RDW: 13.6 % (ref 11.5–15.5)
WBC: 6.3 10*3/uL (ref 4.0–10.5)

## 2018-12-08 LAB — PROTIME-INR
INR: 1.08
Prothrombin Time: 14 seconds (ref 11.4–15.2)

## 2018-12-08 LAB — MRSA PCR SCREENING: MRSA by PCR: POSITIVE — AB

## 2018-12-08 MED ORDER — CEFAZOLIN SODIUM-DEXTROSE 2-4 GM/100ML-% IV SOLN
INTRAVENOUS | Status: AC
  Administered 2018-12-08: 2 g via INTRAVENOUS
  Filled 2018-12-08: qty 100

## 2018-12-08 MED ORDER — HEPARIN SODIUM (PORCINE) 5000 UNIT/ML IJ SOLN
5000.0000 [IU] | Freq: Three times a day (TID) | INTRAMUSCULAR | Status: DC
  Administered 2018-12-08 – 2018-12-12 (×11): 5000 [IU] via SUBCUTANEOUS
  Filled 2018-12-08 (×11): qty 1

## 2018-12-08 MED ORDER — IOPAMIDOL (ISOVUE-300) INJECTION 61%
50.0000 mL | Freq: Once | INTRAVENOUS | Status: AC | PRN
  Administered 2018-12-08: 5 mL

## 2018-12-08 MED ORDER — MIDAZOLAM HCL 2 MG/2ML IJ SOLN
INTRAMUSCULAR | Status: AC | PRN
  Administered 2018-12-08: 1 mg via INTRAVENOUS

## 2018-12-08 MED ORDER — MIDAZOLAM HCL 2 MG/2ML IJ SOLN
INTRAMUSCULAR | Status: AC
  Filled 2018-12-08: qty 4

## 2018-12-08 MED ORDER — IOPAMIDOL (ISOVUE-300) INJECTION 61%
INTRAVENOUS | Status: AC
  Administered 2018-12-08: 5 mL
  Filled 2018-12-08: qty 50

## 2018-12-08 MED ORDER — CEFAZOLIN SODIUM-DEXTROSE 2-4 GM/100ML-% IV SOLN
2.0000 g | Freq: Once | INTRAVENOUS | Status: AC
  Administered 2018-12-08: 2 g via INTRAVENOUS

## 2018-12-08 MED ORDER — LIDOCAINE HCL 1 % IJ SOLN
INTRAMUSCULAR | Status: AC
  Filled 2018-12-08: qty 20

## 2018-12-08 MED ORDER — CHLORHEXIDINE GLUCONATE CLOTH 2 % EX PADS
6.0000 | MEDICATED_PAD | Freq: Every day | CUTANEOUS | Status: DC
  Administered 2018-12-09 – 2018-12-12 (×4): 6 via TOPICAL

## 2018-12-08 MED ORDER — FENTANYL CITRATE (PF) 100 MCG/2ML IJ SOLN
INTRAMUSCULAR | Status: AC | PRN
  Administered 2018-12-08: 25 ug via INTRAVENOUS

## 2018-12-08 MED ORDER — KETOROLAC TROMETHAMINE 15 MG/ML IJ SOLN
15.0000 mg | Freq: Once | INTRAMUSCULAR | Status: AC
  Administered 2018-12-08: 15 mg via INTRAVENOUS
  Filled 2018-12-08: qty 1

## 2018-12-08 MED ORDER — FENTANYL CITRATE (PF) 100 MCG/2ML IJ SOLN
INTRAMUSCULAR | Status: AC
  Filled 2018-12-08: qty 2

## 2018-12-08 MED ORDER — MUPIROCIN 2 % EX OINT
1.0000 "application " | TOPICAL_OINTMENT | Freq: Two times a day (BID) | CUTANEOUS | Status: DC
  Administered 2018-12-08 – 2018-12-12 (×9): 1 via NASAL
  Filled 2018-12-08: qty 22

## 2018-12-08 NOTE — Telephone Encounter (Signed)
New message   Patient's husband wants to let you know that the patient is in [facility] rm 1520.

## 2018-12-08 NOTE — Progress Notes (Signed)
Initial Nutrition Assessment  DOCUMENTATION CODES:   Non-severe (moderate) malnutrition in context of acute illness/injury  INTERVENTION:  Monitor for diet advancement -suspect diet will advance to CL s/p upcoming IR procedure; recommend  -Boost Breeze po TID, each supplement provides 250 kcal and 9 grams of protein   fter procedure -MVI   NUTRITION DIAGNOSIS:   Moderate Malnutrition related to acute illness as evidenced by moderate fat depletion, moderate muscle depletion.   GOAL:   Patient will meet greater than or equal to 90% of their needs   MONITOR:   Diet advancement, Weight trends, Labs  REASON FOR ASSESSMENT:   Malnutrition Screening Tool    ASSESSMENT:   73 year old female with medical history significant of left ureteric stricture, left ureteric stone with hydronephrosis and unsuccessful laser ablation, history of ADD, HTN, fibromyalgia, essential tremors, and gastric bypass who presented to ED with hallucinations and confusion. Pt admitted with mild delirium, UTI, and persistent left hydronephrosis   Patient currently NPO for reinsertion of nephrostomy tube by IR  Patient excited to see RD at visit and inquired if she could have food and stated that she was starving. Patient reports that she feels hungry all the time, denies wt gain, n/v/d. Patient recalls UBW 160lbs in Feb. 2019.   Patient recalls 63meals/day. Breakfast patient enjoys cereals and crepes with cranberries and SF syrup, and reports fish, hamburgers, ham, some fruit, beans and pots for lunch/dinner.   Patient reports taking liquid B12 daily since her [place] in 2004 and no other MVI on a regular basis.   Medications reviewed and include: Adderall XR 20 mg Effexor XR 150 mg, Rocephin  Labs: K 3.2 (L) repleting  NUTRITION - FOCUSED PHYSICAL EXAM:    Most Recent Value  Orbital Region  Mild depletion  Upper Arm Region  Moderate depletion  Thoracic and Lumbar Region  Mild depletion   Buccal Region  Moderate depletion  Temple Region  Mild depletion  Clavicle Bone Region  Moderate depletion  Clavicle and Acromion Bone Region  Moderate depletion  Scapular Bone Region  Moderate depletion  Dorsal Hand  Unable to assess [edema to bilateral hands]  Patellar Region  Mild depletion  Anterior Thigh Region  Mild depletion  Posterior Calf Region  Mild depletion  Edema (RD Assessment)  Mild  Hair  Reviewed  Eyes  Reviewed  Mouth  Reviewed [endentulous]  Skin  Reviewed  Nails  Reviewed      Diet Order:   Diet Order            Diet NPO time specified  Diet effective midnight              EDUCATION NEEDS:   No education needs have been identified at this time  Skin:  Skin Assessment: Reviewed RN Assessment  Last BM:  unknown  Height:   Ht Readings from Last 1 Encounters:  12/08/18 5\' 5"  (1.651 m)    Weight: 136 lbs  Wt Readings from Last 1 Encounters:  12/07/18 62.1 kg   11/20/18 59.4 kg  10/24/18 56.2 kg  10/20/18 57.2 kg  10/19/18 56.9 kg  10/16/18 58.3 kg     Ideal Body Weight:  56.8 kg  BMI:  Body mass index is 22.8 kg/m.  Estimated Nutritional Needs:   Kcal:  1435-1600  Protein:  50-68 grams  Fluid:  </=1.6L/day    Lars Masson, RD, LDN  After Hours/Weekend Pager: [phone]

## 2018-12-08 NOTE — Procedures (Signed)
Interventional Radiology Procedure:   Indications: Left hydronephrosis and chronic ureter obstruction/stricture  Procedure: Left nephrostomy tube placement  Findings: Left hydronephrosis.  Placed 10 Fr drain and removed 40 ml of purulent looking yellow fluid from collecting system.  Complications: none     EBL: Minimal  Plan: Send fluid for culture  Drain to gravity bag and anticipate minimal output based on decreased function in left kidney.     Ramsey Guadamuz R. Lowella Dandy, MD  Pager: [phone]

## 2018-12-08 NOTE — Progress Notes (Signed)
Chief Complaint: Patient was seen in consultation today for (L)PCN at the request of Dr. Wilkie Aye  Referring Physician(s): Dr. Wilkie Aye  Supervising Physician: Richarda Overlie  Patient Status: [facility] - In-pt  History of Present Illness: [name] is a 73 y.o. female with hx of infected/obstructing left proximal ureteral stone requiring left nephrostomy tube placement on 08/22/18. She subsequently underwent a left USE on 11/20 at which time she was noted with left UPJ/proximal ureteral stricture that was unable to be dilated from a retrograde approach. She returned to the OR on 12/13 where she underwent attempted dilation/laser ablation of stricture from an antegrade approach, however this was ultimatley unsuccessful. Her nephrostomy tube was replaced at that time.  Patient was then seen in by Urology on 12/30 with a CT scan and lasix renogram, at which time it became apparent that her nephrostomy tube had become dislodged. The decision was made to defer attempts at ureteral reconstruction and instead manage conservatively with continued observation with plans to only replace the nephrostomy tube if she were to experience pain or infection.  She has been admitted after developing some AMS yesterday and the husband feared she was developing infection. Her UA is + and her CT scan shows recurrent moderate-severe (L)hydronephrosis. Urology was consulted and has requested IR place new (L)PCN for decompression. PMHx, meds, labs, imaging, allergies reviewed. Feels a little better this am, mental status pretty much back to baseline. Has been NPO today as directed. Husband at bedside.   Past Medical History:  Diagnosis Date  . Abrasion of right heel    during admission 10/ 2019 right heel open skin due to movement on sheet  . ADD (attention deficit disorder)   . Anemia, mild   . Anxiety   . Arthritis    "joints, back"  . At high risk for falls    10-19-2018 pt has fell  twice in past week, tripped  . Chronic back pain    "all over my back" (10/02/2013)  . Crushing injury of arm, right 02/06/1989's   "it was crushed; wore cast from fingers to top of my shoulder" (11/25/2  . Crushing injury of left wrist 05/11/1989's  . DOE (dyspnea on exertion)   . Essential tremor   . Fibromyalgia   . History of palpitations 2002   recurrent  . History of panic attacks   . History of pneumonia 08/21/2018   w/ acute respiratory failure/ hypoxia  . History of sepsis    admission 08-21-2018  secondary to uropathy obstructive from ureteral stone   . Hyperlipidemia   . Hypertension    10-19-2018 PER PT AMLODIPINE ON HOLD SINCE 10/ 2019 DUE TO KIDNEY ISSUES PER DOCTOR  . Idiopathic scoliosis 04/19/2013  . Left ureteral stone   . Migraines    "once/month" (10/02/2013)  . Osteoporosis   . Other vitamin B12 deficiency anemia   . PONV (postoperative nausea and vomiting)   . Pulmonary nodules/lesions, multiple   . Renal insufficiency   . S/P gastric bypass 09/16/2003  . S/P insertion of spinal cord stimulator    per pt remote is missing  . Wears dentures    upper  . Wears glasses   . Wears glasses     Past Surgical History:  Procedure Laterality Date  . ABDOMINAL HYSTERECTOMY  1980's   W/  BSO  AND APPENDECTOMY  . CARDIAC CATHETERIZATION  12-13-2002    dr Eden Emms   normal coronaries  . CYSTOSCOPY WITH URETEROSCOPY AND STENT  PLACEMENT Left 09/27/2018   Procedure: LEFT  URETEROSCOPY, HOLMIUM LASER LITHOTRIPSY;  Surgeon: Crist FatHerrick, Benjamin W, MD;  Location: [facility];  Service: Urology;  Laterality: Left;  . D & C HYSTERSCOPY RESECTION MYOMECTOMY  1980s  . IR NEPHROSTOMY PLACEMENT LEFT  08/22/2018  . KNEE ARTHROSCOPY Right 03-31-2001    dr Simonne Comeaplington @[facility]   . NEPHROLITHOTOMY Left 10/20/2018   Procedure: LEFT NEPHROLITHOTOMY PERCUTANEOUS;  Surgeon: Crist FatHerrick, Benjamin W, MD;  Location: [facility];  Service: Urology;  Laterality: Left;  . REDUCTION MAMMAPLASTY Bilateral 2001  .  ROUX-EN-Y GASTRIC BYPASS  09-16-2003   dr Judie Petitm. Daphine Deutschermartin  @[facility]    via Laparoscopy w/ gastrojejunostomy  . SPINAL CORD STIMULATOR IMPLANT  2016   10-19-2018  PER PT HAS REMOTE BUT HAS NOT USED IT SINCE DEC 2018  . TONSILLECTOMY AND ADENOIDECTOMY  1955  . TUBAL LIGATION  1980's    Allergies: Patient has no known allergies.  Medications:  Current Facility-Administered Medications:  .  0.45 % sodium chloride infusion, , Intravenous, Continuous, Garba, Mohammad L, MD, Last Rate: 75 mL/hr at 12/07/18 2306 .  amitriptyline (ELAVIL) tablet 75 mg, 75 mg, Oral, QHS, Garba, Mohammad L, MD, 75 mg at 12/07/18 2301 .  amphetamine-dextroamphetamine (ADDERALL XR) 24 hr capsule 20 mg, 20 mg, Oral, Daily, Earlie LouGarba, Mohammad L, MD, 20 mg at 12/08/18 0903 .  cefTRIAXone (ROCEPHIN) 1 g in sodium chloride 0.9 % 100 mL IVPB, 1 g, Intravenous, QHS, Garba, Mohammad L, MD .  Chlorhexidine Gluconate Cloth 2 % PADS 6 each, 6 each, Topical, Q0600, Mikeal HawthorneGarba, Mohammad L, MD .  clonazePAM (KLONOPIN) disintegrating tablet 0.125 mg, 0.125 mg, Oral, Daily PRN, Earlie LouGarba, Mohammad L, MD, 0.125 mg at 12/07/18 2301 .  heparin injection 5,000 Units, 5,000 Units, Subcutaneous, Q8H, Leopold Smyers, PA-C .  morphine 2 MG/ML injection 2 mg, 2 mg, Intravenous, Q4H PRN, Mikeal HawthorneGarba, Mohammad L, MD .  mupirocin ointment (BACTROBAN) 2 % 1 application, 1 application, Nasal, BID, Rometta EmeryGarba, Mohammad L, MD, 1 application at 12/08/18 [phone] .  ondansetron (ZOFRAN) tablet 4 mg, 4 mg, Oral, Q6H PRN **OR** ondansetron (ZOFRAN) injection 4 mg, 4 mg, Intravenous, Q6H PRN, Mikeal HawthorneGarba, Mohammad L, MD .  venlafaxine XR (EFFEXOR-XR) 24 hr capsule 150 mg, 150 mg, Oral, Daily, Mikeal HawthorneGarba, Mohammad L, MD, 150 mg at 12/08/18 [id number]    Family History  Adopted: Yes  Problem Relation Age of Onset  . COPD Mother   . Emphysema Mother   . Heart attack Mother   . Other Mother        tobacco abuse    Social History   Socioeconomic History  . Marital status: Married    Spouse name: Not  on file  . Number of children: 2  . Years of education: Not on file  . Highest education level: Not on file  Occupational History  . Occupation: Retired  Engineer, productionocial Needs  . Financial resource strain: Not on file  . Food insecurity:    Worry: Not on file    Inability: Not on file  . Transportation needs:    Medical: Not on file    Non-medical: Not on file  Tobacco Use  . Smoking status: Never Smoker  . Smokeless tobacco: Never Used  Substance and Sexual Activity  . Alcohol use: No  . Drug use: No  . Sexual activity: Never    Partners: Male  Lifestyle  . Physical activity:    Days per week: Not on file    Minutes per session: Not on file  .  Stress: Not on file  Relationships  . Social connections:    Talks on phone: Not on file    Gets together: Not on file    Attends religious service: Not on file    Active member of club or organization: Not on file    Attends meetings of clubs or organizations: Not on file    Relationship status: Not on file  Other Topics Concern  . Not on file  Social History Narrative  . Not on file     Review of Systems: A 12 point ROS discussed and pertinent positives are indicated in the HPI above.  All other systems are negative.  Review of Systems  Vital Signs: BP (!) 148/82 (BP Location: Right Arm)   Pulse 92   Temp 100 F (37.8 C) (Oral)   Resp (!) 27   Ht 5\' 5"  (1.651 m)   Wt 62.1 kg   SpO2 100%   BMI 22.80 kg/m   Physical Exam Constitutional:      Appearance: Normal appearance.  HENT:     Mouth/Throat:     Mouth: Mucous membranes are moist.     Pharynx: Oropharynx is clear.  Cardiovascular:     Rate and Rhythm: Normal rate and regular rhythm.     Heart sounds: Normal heart sounds.  Pulmonary:     Effort: Pulmonary effort is normal. No respiratory distress.     Breath sounds: Normal breath sounds.  Abdominal:     General: Abdomen is flat.     Palpations: Abdomen is soft.     Tenderness: There is no abdominal  tenderness.  Skin:    General: Skin is warm and dry.  Neurological:     General: No focal deficit present.     Mental Status: She is alert and oriented to person, place, and time.  Psychiatric:        Mood and Affect: Mood normal.       Imaging: Ct Renal Stone Study  Result Date: 12/07/2018 CLINICAL DATA:  Left flank pain.  History of kidney cancer. EXAM: CT ABDOMEN AND PELVIS WITHOUT CONTRAST TECHNIQUE: Multidetector CT imaging of the abdomen and pelvis was performed following the standard protocol without IV contrast. COMPARISON:  11/02/2018 FINDINGS: Lower chest: No acute abnormality. Hepatobiliary: No focal hepatic abnormality. Gallbladder unremarkable. Pancreas: No focal abnormality or ductal dilatation. Spleen: No focal abnormality.  Normal size. Adrenals/Urinary Tract: Moderate to severe left hydronephrosis is similar to prior study. Left ureter cannot be followed. No visible ureteral stones. No hydronephrosis on the right. No adrenal mass. Urinary bladder grossly unremarkable. Stomach/Bowel: Large stool burden throughout the colon. Postoperative changes in the stomach. No evidence of bowel obstruction. Vascular/Lymphatic: Aortic atherosclerosis. No enlarged abdominal or pelvic lymph nodes. Reproductive: Prior hysterectomy.  No adnexal masses. Other: No free fluid or free air. Musculoskeletal: No acute bony abnormality. Severe degenerative disc and facet disease throughout the lumbar spine. Compression fractures diffusely throughout the lumbar spine are stable since prior study. IMPRESSION: Stable moderate to severe left hydronephrosis. Large stool burden in the colon. Electronically Signed   By: Charlett Nose M.D.   On: 12/07/2018 18:45    Labs:  CBC: Recent Labs    09/27/18 1109 10/19/18 1255 12/07/18 1624 12/08/18 0608  WBC 5.3 6.4 6.0 6.3  HGB 9.9* 11.7* 10.4* 10.6*  HCT 32.7* 38.3 33.3* 33.7*  PLT 290 205 186 225    COAGS: Recent Labs    08/21/18 1815 08/22/18 0016  08/26/18 0839 09/01/18 4259  INR 1.87 1.80 2.80 1.24  APTT  --   --  57*  --     BMP: Recent Labs    09/27/18 1109 10/19/18 1255 12/07/18 1624 12/08/18 0608  NA 140 143 135 137  K 4.3 3.4* 3.2* 3.4*  CL 108 109 100 105  CO2 25 28 24 23   GLUCOSE 82 87 97 90  BUN 18 15 25* 19  CALCIUM 9.4 9.1 9.1 8.7*  CREATININE 1.10* 0.92 1.63* 1.33*  GFRNONAA 50* >60 32* 40*  GFRAA 58* >60 37* 47*    LIVER FUNCTION TESTS: Recent Labs    08/31/18 0320 09/01/18 0907 12/07/18 1624 12/08/18 0608  BILITOT 0.7 1.0 1.0 0.5  AST 57* 36 14* 11*  ALT 23 18 7 5   ALKPHOS 106 89 66 66  PROT 5.5* 5.2* 7.5 6.5  ALBUMIN 2.0* 2.0* 3.6 2.9*    TUMOR MARKERS: No results for input(s): AFPTM, CEA, CA199, CHROMGRNA in the last 8760 hours.  Assessment and Plan: (L)hydronephrosis with +UA secondary to chronic ureteral obstruction. Plan for (L)PCN placement. Pt recalls procedure from last October. Labs reviewed, checking INR Risks and benefits of PCN were discussed with the patient including, but not limited to, infection, bleeding, significant bleeding causing loss or decrease in renal function or damage to adjacent structures.   All of the patient's questions were answered, patient is agreeable to proceed.  Consent signed and in chart.   Thank you for this interesting consult.  I greatly enjoyed meeting [name] and look forward to participating in their care.  A copy of this report was sent to the requesting provider on this date.  Electronically Signed: Brayton El, PA-C 12/08/2018, 10:24 AM   I spent a total of 20 minutes in face to face in clinical consultation, greater than 50% of which was counseling/coordinating care for PCN placement.

## 2018-12-08 NOTE — Progress Notes (Addendum)
PROGRESS NOTE    Molly HawthorneRita C Solorzano  ZOX:[id number] DOB: 10-29-1946 DOA: 12/07/2018 PCP: Donato SchultzLowne Chase, [name], DO    Brief Narrative:   [name] is a 73 y.o. female with medical history significant of left ureteric stricture diagnosed in December last year, previous left ureteric stone with hydronephrosis, previous attempted laser ablation and dilation of stricture which was unsuccessful, history of ADD, hypertension, fibromyalgia, anxiety disorder, essential tremors who came to the ER today with hallucinations and generalized confusion. Patient has had previous nephrostomy tube placed due to ureteric stricture.  This has failed at that point. She was on conservative management due to failed endoscopic management by urology.  She started experiencing confusion and hallucinations today.  Her husband called the urology clinic where they were referred to the ER.  In the ER patient is evaluated and found to have mild delirium which is improving. There was evidence of UTI from urinalysis and CT kidney stone protocol showed persistent left hydronephrosis.  Patient has been seen by urology and recommended admission for management of her UTI which seems complex as well as left hydronephrosis..  ED Course: Temperature is 98 for blood pressure 146/86 pulse 82 respirate of 18 oxygen sat 100% room air.  White count 6.0 hemoglobin is 10.4 with platelet 186.  Sodium 135 potassium 3.2 with BUN 25 creatinine 1.63 calcium 9.1 glucose 97.  CT renal stone shows stable moderate to severe left hydronephrosis with large stool burden in the colon.   Assessment & Plan:   Active Problems:   Hyperlipidemia   GERD   Essential hypertension   Sepsis (HCC)   Delirium   Hydronephrosis of left kidney   Pyelonephritis   Malnutrition of moderate degree   Left hydronephrosis Patient presents with worsening left flank pain.  History of obstructive uropathy with previous left nephrostomy tube in place.  CT renal notable  for stable/moderate left hydronephrosis. --Urology consulted, appreciate assistance; recommend IR placement of nephrostomy tube --IR for placement of left nephrostomy today --Continue IV antibiotics with ceftriaxone  Acute metabolic encephalopathy Acute pyelonephritis Sepsis, present on admission Patient presenting with AMS, left flank pain found to have likely pyelonephritis from persistent obstructive uropathy. --UA: Positive leukoesterase, negative nitrite, greater than 50 WBCs --Continue IV antibiotics with ceftriaxone --Await further urine culture identification and susceptibilities  Depression/anxiety: Continue Effexor 150 mg p.o. daily, clonazepam 0.125 mg daily as needed  Hypokalemia Repleted, repeat electrolytes in the a.m. to collude magnesium  Hyperlipidemia: Continue statin  GERD: Continue PPI   DVT prophylaxis: Heparin Code Status: Full code Family Communication: Husband at bedside Disposition Plan: Continue inpatient hospitalization, likely home in the next 1-2 days   Consultants:   Urology  Procedures:   IR for left nephrostomy tube placement 12/08/2018  Antimicrobials:   Ceftriaxone 12/08/2018   Subjective:  Patient seen and examined at bedside, husband present.  Continues with some left flank tenderness.  Confusion has resolved per husband and spouse appears to be at her normal baseline.  Currently awaiting IR for left nephrostomy tube placement.  Afebrile.  No other complaints at this time.  Denies headache, no visual changes, no fever/chills/night sweats, no cough/congestion, no chest pain, no palpitations, no abdominal pain, no weakness, no nausea/vomiting/diarrhea.  No acute events overnight per nursing staff.  Objective: Vitals:   12/08/18 1640 12/08/18 1645 12/08/18 1646 12/08/18 1650  BP: (!) 141/89 (!) 143/87  (!) 148/91  Pulse: 95   98  Resp: (!) 21 17  (!) 21  Temp:  TempSrc:      SpO2: (!) 86%  (!) 89% (!) 87%  Weight:      Height:         Intake/Output Summary (Last 24 hours) at 12/08/2018 1651 Last data filed at 12/08/2018 0320 Gross per 24 hour  Intake 400 ml  Output -  Net 400 ml   Filed Weights   12/07/18 1547  Weight: 62.1 kg    Examination:  General exam: Appears calm and comfortable  Respiratory system: Clear to auscultation. Respiratory effort normal. Cardiovascular system: S1 & S2 heard, RRR. No JVD, murmurs, rubs, gallops or clicks. No pedal edema. Gastrointestinal system: Abdomen is nondistended, soft and nontender. No organomegaly or masses felt. Normal bowel sounds heard. Central nervous system: Alert and oriented. No focal neurological deficits. Extremities: Symmetric 5 x 5 power. Skin: No rashes, lesions or ulcers Psychiatry: Judgement and insight appear normal. Mood & affect appropriate.     Data Reviewed: I have personally reviewed following labs and imaging studies  CBC: Recent Labs  Lab 12/07/18 1624 12/08/18 0608  WBC 6.0 6.3  HGB 10.4* 10.6*  HCT 33.3* 33.7*  MCV 91.0 90.6  PLT 186 225   Basic Metabolic Panel: Recent Labs  Lab 12/07/18 1624 12/08/18 0608  NA 135 137  K 3.2* 3.4*  CL 100 105  CO2 24 23  GLUCOSE 97 90  BUN 25* 19  CREATININE 1.63* 1.33*  CALCIUM 9.1 8.7*   GFR: Estimated Creatinine Clearance: 35.4 mL/min (A) (by C-G formula based on SCr of 1.33 mg/dL (H)). Liver Function Tests: Recent Labs  Lab 12/07/18 1624 12/08/18 0608  AST 14* 11*  ALT 7 5  ALKPHOS 66 66  BILITOT 1.0 0.5  PROT 7.5 6.5  ALBUMIN 3.6 2.9*   Recent Labs  Lab 12/07/18 1624  LIPASE 25   No results for input(s): AMMONIA in the last 168 hours. Coagulation Profile: Recent Labs  Lab 12/08/18 1029  INR 1.08   Cardiac Enzymes: No results for input(s): CKTOTAL, CKMB, CKMBINDEX, TROPONINI in the last 168 hours. BNP (last 3 results) No results for input(s): PROBNP in the last 8760 hours. HbA1C: No results for input(s): HGBA1C in the last 72 hours. CBG: No results for  input(s): GLUCAP in the last 168 hours. Lipid Profile: No results for input(s): CHOL, HDL, LDLCALC, TRIG, CHOLHDL, LDLDIRECT in the last 72 hours. Thyroid Function Tests: No results for input(s): TSH, T4TOTAL, FREET4, T3FREE, THYROIDAB in the last 72 hours. Anemia Panel: No results for input(s): VITAMINB12, FOLATE, FERRITIN, TIBC, IRON, RETICCTPCT in the last 72 hours. Sepsis Labs: No results for input(s): PROCALCITON, LATICACIDVEN in the last 168 hours.  Recent Results (from the past 240 hour(s))  MRSA PCR Screening     Status: Abnormal   Collection Time: 12/07/18 11:12 PM  Result Value Ref Range Status   MRSA by PCR POSITIVE (A) NEGATIVE Final    Comment:        The GeneXpert MRSA Assay (FDA approved for NASAL specimens only), is one component of a comprehensive MRSA colonization surveillance program. It is not intended to diagnose MRSA infection nor to guide or monitor treatment for MRSA infections. RESULT CALLED TO, READ BACK BY AND VERIFIED WITHKathleen Lime: E CAUDLE RN [phone] 12/08/18 A NAVARRO Performed at [facility], [address] [place], [place] [id number]          Radiology Studies: Ct Renal Stone Study  Result Date: 12/07/2018 CLINICAL DATA:  Left flank pain.  History of kidney cancer.  EXAM: CT ABDOMEN AND PELVIS WITHOUT CONTRAST TECHNIQUE: Multidetector CT imaging of the abdomen and pelvis was performed following the standard protocol without IV contrast. COMPARISON:  11/02/2018 FINDINGS: Lower chest: No acute abnormality. Hepatobiliary: No focal hepatic abnormality. Gallbladder unremarkable. Pancreas: No focal abnormality or ductal dilatation. Spleen: No focal abnormality.  Normal size. Adrenals/Urinary Tract: Moderate to severe left hydronephrosis is similar to prior study. Left ureter cannot be followed. No visible ureteral stones. No hydronephrosis on the right. No adrenal mass. Urinary bladder grossly unremarkable. Stomach/Bowel: Large stool burden  throughout the colon. Postoperative changes in the stomach. No evidence of bowel obstruction. Vascular/Lymphatic: Aortic atherosclerosis. No enlarged abdominal or pelvic lymph nodes. Reproductive: Prior hysterectomy.  No adnexal masses. Other: No free fluid or free air. Musculoskeletal: No acute bony abnormality. Severe degenerative disc and facet disease throughout the lumbar spine. Compression fractures diffusely throughout the lumbar spine are stable since prior study. IMPRESSION: Stable moderate to severe left hydronephrosis. Large stool burden in the colon. Electronically Signed   By: Charlett Nose M.D.   On: 12/07/2018 18:45        Scheduled Meds: . amitriptyline  75 mg Oral QHS  . amphetamine-dextroamphetamine  20 mg Oral Daily  . Chlorhexidine Gluconate Cloth  6 each Topical Q0600  . fentaNYL      . heparin  5,000 Units Subcutaneous Q8H  . iopamidol      . lidocaine      . midazolam      . mupirocin ointment  1 application Nasal BID  . venlafaxine XR  150 mg Oral Daily   Continuous Infusions: . sodium chloride 75 mL/hr at 12/08/18 1325  .  ceFAZolin (ANCEF) IV 2 g (12/08/18 1630)  . cefTRIAXone (ROCEPHIN)  IV       LOS: 1 day    Time spent: 29 min    Alvira Philips [place], DO [facility] Pager [phone]  If 7PM-7AM, please contact night-coverage [email or web address] Password [facility] 12/08/2018, 4:51 PM

## 2018-12-08 NOTE — Progress Notes (Signed)
I spoke w patient and her husband over the phone this afternoon.  The patient was in IR when I went to see her today.  There was very purulent urine aspirated from her kidney.  She will need her kidney removed in the near future.  She'll need to keep the neph tube until then.  I will plan to see her in clinic in the next week or so, and we can plan her surgery at that point.  She should be put on culture specific antibiotics for 1 4 days total.  I will see her next week if she remains hospitalized.

## 2018-12-08 NOTE — Telephone Encounter (Signed)
Please tell her that I am so sorry that she is in the hospital and we are thinking of her. If she needs cardiac assistance during this stay we will see her

## 2018-12-09 LAB — CBC
HCT: 37 % (ref 36.0–46.0)
Hemoglobin: 11.4 g/dL — ABNORMAL LOW (ref 12.0–15.0)
MCH: 27.3 pg (ref 26.0–34.0)
MCHC: 30.8 g/dL (ref 30.0–36.0)
MCV: 88.5 fL (ref 80.0–100.0)
Platelets: 123 10*3/uL — ABNORMAL LOW (ref 150–400)
RBC: 4.18 MIL/uL (ref 3.87–5.11)
RDW: 13.5 % (ref 11.5–15.5)
WBC: 7 10*3/uL (ref 4.0–10.5)
nRBC: 0 % (ref 0.0–0.2)

## 2018-12-09 LAB — BASIC METABOLIC PANEL
Anion gap: 9 (ref 5–15)
BUN: 16 mg/dL (ref 8–23)
CO2: 23 mmol/L (ref 22–32)
CREATININE: 1.24 mg/dL — AB (ref 0.44–1.00)
Calcium: 8.6 mg/dL — ABNORMAL LOW (ref 8.9–10.3)
Chloride: 101 mmol/L (ref 98–111)
GFR calc Af Amer: 51 mL/min — ABNORMAL LOW (ref >60)
GFR calc non Af Amer: 44 mL/min — ABNORMAL LOW (ref >60)
Glucose, Bld: 126 mg/dL — ABNORMAL HIGH (ref 70–99)
Potassium: 3.4 mmol/L — ABNORMAL LOW (ref 3.5–5.1)
Sodium: 133 mmol/L — ABNORMAL LOW (ref 135–145)

## 2018-12-09 LAB — MAGNESIUM: Magnesium: 2.1 mg/dL (ref 1.7–2.4)

## 2018-12-09 MED ORDER — SUMATRIPTAN SUCCINATE 50 MG PO TABS
50.0000 mg | ORAL_TABLET | ORAL | Status: DC | PRN
  Administered 2018-12-09 – 2018-12-12 (×3): 50 mg via ORAL
  Filled 2018-12-09 (×5): qty 1

## 2018-12-09 MED ORDER — POTASSIUM CHLORIDE CRYS ER 20 MEQ PO TBCR
40.0000 meq | EXTENDED_RELEASE_TABLET | Freq: Once | ORAL | Status: AC
  Administered 2018-12-09: 40 meq via ORAL
  Filled 2018-12-09: qty 2

## 2018-12-09 MED ORDER — OXYCODONE HCL 5 MG PO TABS
15.0000 mg | ORAL_TABLET | Freq: Four times a day (QID) | ORAL | Status: DC | PRN
  Administered 2018-12-09 – 2018-12-12 (×7): 15 mg via ORAL
  Filled 2018-12-09 (×7): qty 3

## 2018-12-09 NOTE — Progress Notes (Signed)
PROGRESS NOTE    Molly HawthorneRita C Rybarczyk  OZH:[id number] DOB: 04-18-1946 DOA: 12/07/2018 PCP: Donato SchultzLowne Chase, [name], DO  Outpatient Specialists:   Brief Narrative:  [name] is a 73 year old female with past medical history significant for left ureteric stricture diagnosed in December last year, previous left ureteric stone with hydronephrosis, previous attempted laser ablation and dilation of stricture which was unsuccessful, history of ADD, hypertension, fibromyalgia, anxiety disorder and essential tremors.  Patient has had nephrostomy tube placed previously due to ureteric stricture.  Patient was on conservative management due to failed endoscopic management by urology.  Patient presented with confusion and hallucinations.  Her husband called the urology clinic and they were referred to the ER.  In the ER, the patient was evaluated and found to have mild delirium which is improving.  There was evidence of UTI from urinalysis and CT kidney stone protocol showed persistent left hydronephrosis.  Patient was admitted for further assessment and management.  Patient left-sided placement of left nephrostomy tube by IR.  Urology plans to proceed with left nephrectomy eventually.  Assessment & Plan:   Active Problems:   Hyperlipidemia   GERD   Essential hypertension   Sepsis (HCC)   Delirium   Hydronephrosis of left kidney   Pyelonephritis   Malnutrition of moderate degree  left hydronephrosis:  Most likely cause of delirium.  Patient has failed previous attempts at endoscopic intervention.  She has left ureteric stricture which could be the cause.  Recommendation from urology is to reinsert nephrostomy tube by IR.  We will admit the patient stabilized the patient and get IR consultation. 12/09/2018: Patient has undergone left-sided nephrostomy tube placement by interventional radiology team.  Urology plans eventual nephrectomy.  Acute toxic encephalopathy:  Most likely due to complicated  UTI/pyelonephritis from left kidney stricture.  Continue supportive care.  Patient is more awake now. 12/09/2018: Improved significantly.  Acute complicated UTI/pyelonephritis:  Based on her urinalysis.  She has large to moderate leukocyte Estrace and bacteria.  IV Rocephin initiated will continue.  Await urine culture sensitivity results. Repeat urinalysis and urine culture. Continue antibiotics.  Hypertension:  Blood pressure at this point is stable.  Continue home regimen and monitor.  Hyperlipidemia:  Continue with statin.  GERD: Continue with PPIs.  Hypokalemia:  Continue to replete and monitor.     DVT prophylaxis: Heparin Code Status: Full code Family Communication:  Disposition Plan:  This will depend on hospital course. Consults called: Urology and interventional radiology   Procedures:   Left-sided nephrostomy tube placed by interventional radiology  Antimicrobials:   IV Rocephin   Subjective: No fever or chills No obvious confusion  Objective: Vitals:   12/08/18 2130 12/09/18 0457 12/09/18 0900 12/09/18 1353  BP: (!) 146/90 (!) 159/102 137/89 (!) 147/95  Pulse: 88 95 95 85  Resp: 20 20 18 18   Temp:  98.1 F (36.7 C) 98.8 F (37.1 C) 99.7 F (37.6 C)  TempSrc:   Oral Oral  SpO2:  95% 95% 94%  Weight:      Height:        Intake/Output Summary (Last 24 hours) at 12/09/2018 1739 Last data filed at 12/09/2018 1646 Gross per 24 hour  Intake 1695 ml  Output 1475 ml  Net 220 ml   Filed Weights   12/07/18 1547  Weight: 62.1 kg    Examination:  General exam: Appears calm and comfortable.  Left-sided nephrostomy tube in place. Respiratory system: Clear to auscultation.  Cardiovascular system: S1 & S2 heard.  No pedal edema. Gastrointestinal system: Abdomen is nondistended, soft and nontender. No organomegaly or masses felt. Normal bowel sounds heard. Central nervous system: Alert and oriented.  Patient moves all limbs. Extremities: No  edema    Data Reviewed: I have personally reviewed following labs and imaging studies  CBC: Recent Labs  Lab 12/07/18 1624 12/08/18 0608 12/09/18 0526  WBC 6.0 6.3 7.0  HGB 10.4* 10.6* 11.4*  HCT 33.3* 33.7* 37.0  MCV 91.0 90.6 88.5  PLT 186 225 123*   Basic Metabolic Panel: Recent Labs  Lab 12/07/18 1624 12/08/18 0608 12/09/18 0526  NA 135 137 133*  K 3.2* 3.4* 3.4*  CL 100 105 101  CO2 24 23 23   GLUCOSE 97 90 126*  BUN 25* 19 16  CREATININE 1.63* 1.33* 1.24*  CALCIUM 9.1 8.7* 8.6*  MG  --   --  2.1   GFR: Estimated Creatinine Clearance: 38 mL/min (A) (by C-G formula based on SCr of 1.24 mg/dL (H)). Liver Function Tests: Recent Labs  Lab 12/07/18 1624 12/08/18 0608  AST 14* 11*  ALT 7 5  ALKPHOS 66 66  BILITOT 1.0 0.5  PROT 7.5 6.5  ALBUMIN 3.6 2.9*   Recent Labs  Lab 12/07/18 1624  LIPASE 25   No results for input(s): AMMONIA in the last 168 hours. Coagulation Profile: Recent Labs  Lab 12/08/18 1029  INR 1.08   Cardiac Enzymes: No results for input(s): CKTOTAL, CKMB, CKMBINDEX, TROPONINI in the last 168 hours. BNP (last 3 results) No results for input(s): PROBNP in the last 8760 hours. HbA1C: No results for input(s): HGBA1C in the last 72 hours. CBG: No results for input(s): GLUCAP in the last 168 hours. Lipid Profile: No results for input(s): CHOL, HDL, LDLCALC, TRIG, CHOLHDL, LDLDIRECT in the last 72 hours. Thyroid Function Tests: No results for input(s): TSH, T4TOTAL, FREET4, T3FREE, THYROIDAB in the last 72 hours. Anemia Panel: No results for input(s): VITAMINB12, FOLATE, FERRITIN, TIBC, IRON, RETICCTPCT in the last 72 hours. Urine analysis:    Component Value Date/Time   COLORURINE YELLOW 12/07/2018 1908   APPEARANCEUR HAZY (A) 12/07/2018 1908   LABSPEC 1.024 12/07/2018 1908   PHURINE 5.0 12/07/2018 1908   GLUCOSEU NEGATIVE 12/07/2018 1908   HGBUR NEGATIVE 12/07/2018 1908   HGBUR negative 10/28/2009 0000   BILIRUBINUR NEGATIVE  12/07/2018 1908   BILIRUBINUR negative 03/24/2015 1615   KETONESUR NEGATIVE 12/07/2018 1908   PROTEINUR 100 (A) 12/07/2018 1908   UROBILINOGEN 2.0 03/24/2015 1615   UROBILINOGEN 1.0 03/06/2015 2030   NITRITE NEGATIVE 12/07/2018 1908   LEUKOCYTESUR MODERATE (A) 12/07/2018 1908   Sepsis Labs: @LABRCNTIP (procalcitonin:4,lacticidven:4)  ) Recent Results (from the past 240 hour(s))  Urine culture     Status: Abnormal (Preliminary result)   Collection Time: 12/07/18  7:08 PM  Result Value Ref Range Status   Specimen Description   Final    URINE, RANDOM Performed at [facility], [address], [place], [place] [address]    Special Requests   Final    NONE Performed at [facility], [address] [place], [place] [address]    Culture >=100,000 COLONIES/mL ENTEROCOCCUS FAECALIS (A)  Final   Report Status PENDING  Incomplete  MRSA PCR Screening     Status: Abnormal   Collection Time: 12/07/18 11:12 PM  Result Value Ref Range Status   MRSA by PCR POSITIVE (A) NEGATIVE Final    Comment:        The GeneXpert MRSA Assay (FDA approved  for NASAL specimens only), is one component of a comprehensive MRSA colonization surveillance program. It is not intended to diagnose MRSA infection nor to guide or monitor treatment for MRSA infections. RESULT CALLED TO, READ BACK BY AND VERIFIED WITHKathleen Lime RN [id number] 12/08/18 A NAVARRO Performed at [facility], [address] [place], [place] [address]          Radiology Studies: Ct Renal Stone Study  Result Date: 12/07/2018 CLINICAL DATA:  Left flank pain.  History of kidney cancer. EXAM: CT ABDOMEN AND PELVIS WITHOUT CONTRAST TECHNIQUE: Multidetector CT imaging of the abdomen and pelvis was performed following the standard protocol without IV contrast. COMPARISON:  11/02/2018 FINDINGS: Lower chest: No acute abnormality. Hepatobiliary: No focal hepatic abnormality. Gallbladder  unremarkable. Pancreas: No focal abnormality or ductal dilatation. Spleen: No focal abnormality.  Normal size. Adrenals/Urinary Tract: Moderate to severe left hydronephrosis is similar to prior study. Left ureter cannot be followed. No visible ureteral stones. No hydronephrosis on the right. No adrenal mass. Urinary bladder grossly unremarkable. Stomach/Bowel: Large stool burden throughout the colon. Postoperative changes in the stomach. No evidence of bowel obstruction. Vascular/Lymphatic: Aortic atherosclerosis. No enlarged abdominal or pelvic lymph nodes. Reproductive: Prior hysterectomy.  No adnexal masses. Other: No free fluid or free air. Musculoskeletal: No acute bony abnormality. Severe degenerative disc and facet disease throughout the lumbar spine. Compression fractures diffusely throughout the lumbar spine are stable since prior study. IMPRESSION: Stable moderate to severe left hydronephrosis. Large stool burden in the colon. Electronically Signed   By: Charlett Nose M.D.   On: 12/07/2018 18:45   Ir Nephrostomy Placement Left  Result Date: 12/08/2018 INDICATION: 73 year old with chronic left ureter high-grade stricture/obstruction with left hydronephrosis. Patient presented with confusion, left flank pain and pyelonephritis. EXAM: PERCUTANEOUS LEFT NEPHROSTOMY TUBE WITH ULTRASOUND AND FLUOROSCOPIC GUIDANCE COMPARISON:  None. MEDICATIONS: Ancef 2 g; The antibiotic was administered in an appropriate time frame prior to skin puncture. ANESTHESIA/SEDATION: Fentanyl 25 mcg IV; Versed 1.0 mg IV Moderate Sedation Time:  28 minutes The patient was continuously monitored during the procedure by the interventional radiology nurse under my direct supervision. CONTRAST:  5 mL Isovue-300-administered into the collecting system(s) FLUOROSCOPY TIME:  Fluoroscopy Time: 1 minutes, 54 seconds; 23 mGy COMPLICATIONS: None immediate. PROCEDURE: Informed written consent was obtained from the patient after a thorough  discussion of the procedural risks, benefits and alternatives. All questions were addressed. A timeout was performed prior to the initiation of the procedure. Patient was placed prone on the interventional table. The left kidney was identified with ultrasound. Left flank was prepped and draped in a sterile fashion. Maximal barrier sterile technique was utilized including caps, mask, sterile gowns, sterile gloves, sterile drape, hand hygiene and skin antiseptic. It was difficult to find a safe percutaneous window for needle placement due to the neurostimulator device in the left flank subcutaneous tissues and bowel adjacent to the left kidney. Skin was anesthetized with 1% lidocaine and a 22 gauge needle was directed between the ribs and away from the bowel using ultrasound guidance. Needle was directed into a dilated calyx. Thick yellow fluid was slowly draining out of the needle hub. A 0.018 wire was advanced to the renal collecting system with ultrasound guidance. Needle was exchanged for an Accustick dilator set. A J wire was advanced into the renal collecting system using ultrasound and fluoroscopic guidance and the tract was dilated to accommodate a 10.2 [place] multipurpose drain. Drain was reconstituted in the renal collecting system, presumably the  renal pelvis. Approximately 40 mL of thick yellow opaque fluid was removed from the left renal collecting system. Small amount of contrast was injected to confirm placement in the renal collecting system and the catheter was flushed with a small amount of normal saline. Catheter was attached to a gravity bag. Catheter was sutured to skin. FINDINGS: Moderate left hydronephrosis. 10 French drain was successfully placed in the left renal collecting system and 40 mL of purulent looking fluid was removed. IMPRESSION: Successful left percutaneous nephrostomy tube placement with ultrasound and fluoroscopic guidance. 40 mL of purulent looking fluid was removed from the  left renal collecting system and fluid was sent for culture. Electronically Signed   By: Richarda Overlie M.D.   On: 12/08/2018 17:33        Scheduled Meds: . amitriptyline  75 mg Oral QHS  . amphetamine-dextroamphetamine  20 mg Oral Daily  . Chlorhexidine Gluconate Cloth  6 each Topical Q0600  . heparin  5,000 Units Subcutaneous Q8H  . mupirocin ointment  1 application Nasal BID  . venlafaxine XR  150 mg Oral Daily   Continuous Infusions: . sodium chloride 75 mL/hr at 12/09/18 0628  . cefTRIAXone (ROCEPHIN)  IV Stopped (12/08/18 2100)     LOS: 2 days    Time spent: 35 minutes.   Berton Mount, MD  [facility] Pager #: [phone] 7PM-7AM contact night coverage as above

## 2018-12-09 NOTE — Progress Notes (Signed)
Referring Physician(s): Herrick,B  Supervising Physician: Jolaine Click  Patient Status:  [facility] - In-pt  Chief Complaint:  Left flank pain, hydronephrosis/pyelonephritis  Subjective: Pt doing ok this am; has some left flank soreness but not worsening; denies N/V/chills   Allergies: Patient has no known allergies.  Medications: Prior to Admission medications   Medication Sig Start Date End Date Taking? Authorizing Provider  amitriptyline (ELAVIL) 75 MG tablet Take 75 mg by mouth at bedtime.   Yes [provider]  amphetamine-dextroamphetamine (ADDERALL XR) 20 MG 24 hr capsule TAKE TWO CAPSULE BY MOUTH EVERY MORNING 11/28/18  Yes [name] R, DO  clonazePAM (KLONOPIN) 0.5 MG tablet Take 0.5 tablets (0.25 mg total) by mouth daily as needed for anxiety. Patient taking differently: Take 0.125 mg by mouth daily as needed for anxiety. Takes 1/4 of a tablet (0.125 mg) 10/03/18  Yes [name] R, DO  furosemide (LASIX) 40 MG tablet Take 40 mg by mouth daily as needed for fluid or edema.    Yes [provider]  ibuprofen (ADVIL,MOTRIN) 200 MG tablet Take 200-600 mg by mouth 2 (two) times daily as needed for headache or moderate pain.    Yes [provider]  oxyCODONE (ROXICODONE) 15 MG immediate release tablet Take 15 mg by mouth 3 (three) times daily.   Yes [provider]  venlafaxine XR (EFFEXOR-XR) 75 MG 24 hr capsule Take 2 capsules (150 mg total) by mouth daily. 10/24/18  Yes Donato Schultz, DO  neomycin-bacitracin-polymyxin (NEOSPORIN) ointment Apply 1 application topically daily as needed for wound care.    [provider]  SUMAtriptan (IMITREX) 50 MG tablet TAKE 1 TABLET AT ONSET OF HEADACHE. MAY REPEAT IN 2 HOURS IF NEEDED Patient taking differently: Take 50 mg by mouth every 2 (two) hours as needed for migraine or headache. May repeat dose in 2 hours if no relief.  Do not exceed 2 doses in 24 hours. 11/06/18    Donato Schultz, DO     Vital Signs: BP 137/89 (BP Location: Right Arm)   Pulse 95   Temp 98.8 F (37.1 C) (Oral)   Resp 18   Ht 5\' 5"  (1.651 m)   Wt 137 lb (62.1 kg)   SpO2 95%   BMI 22.80 kg/m   Physical Exam left PCN intact, site mildly tender, output 150 cc blood tinged urine; PCN flushed without difficulty  Imaging: Ct Renal Stone Study  Result Date: 12/07/2018 CLINICAL DATA:  Left flank pain.  History of kidney cancer. EXAM: CT ABDOMEN AND PELVIS WITHOUT CONTRAST TECHNIQUE: Multidetector CT imaging of the abdomen and pelvis was performed following the standard protocol without IV contrast. COMPARISON:  11/02/2018 FINDINGS: Lower chest: No acute abnormality. Hepatobiliary: No focal hepatic abnormality. Gallbladder unremarkable. Pancreas: No focal abnormality or ductal dilatation. Spleen: No focal abnormality.  Normal size. Adrenals/Urinary Tract: Moderate to severe left hydronephrosis is similar to prior study. Left ureter cannot be followed. No visible ureteral stones. No hydronephrosis on the right. No adrenal mass. Urinary bladder grossly unremarkable. Stomach/Bowel: Large stool burden throughout the colon. Postoperative changes in the stomach. No evidence of bowel obstruction. Vascular/Lymphatic: Aortic atherosclerosis. No enlarged abdominal or pelvic lymph nodes. Reproductive: Prior hysterectomy.  No adnexal masses. Other: No free fluid or free air. Musculoskeletal: No acute bony abnormality. Severe degenerative disc and facet disease throughout the lumbar spine. Compression fractures diffusely throughout the lumbar spine are stable since prior study. IMPRESSION: Stable moderate to severe left hydronephrosis.  Large stool burden in the colon. Electronically Signed   By: Charlett NoseKevin  Dover M.D.   On: 12/07/2018 18:45   Ir Nephrostomy Placement Left  Result Date: 12/08/2018 INDICATION: 73 year old with chronic left ureter high-grade stricture/obstruction with left hydronephrosis.  Patient presented with confusion, left flank pain and pyelonephritis. EXAM: PERCUTANEOUS LEFT NEPHROSTOMY TUBE WITH ULTRASOUND AND FLUOROSCOPIC GUIDANCE COMPARISON:  None. MEDICATIONS: Ancef 2 g; The antibiotic was administered in an appropriate time frame prior to skin puncture. ANESTHESIA/SEDATION: Fentanyl 25 mcg IV; Versed 1.0 mg IV Moderate Sedation Time:  28 minutes The patient was continuously monitored during the procedure by the interventional radiology nurse under my direct supervision. CONTRAST:  5 mL Isovue-300-administered into the collecting system(s) FLUOROSCOPY TIME:  Fluoroscopy Time: 1 minutes, 54 seconds; 23 mGy COMPLICATIONS: None immediate. PROCEDURE: Informed written consent was obtained from the patient after a thorough discussion of the procedural risks, benefits and alternatives. All questions were addressed. A timeout was performed prior to the initiation of the procedure. Patient was placed prone on the interventional table. The left kidney was identified with ultrasound. Left flank was prepped and draped in a sterile fashion. Maximal barrier sterile technique was utilized including caps, mask, sterile gowns, sterile gloves, sterile drape, hand hygiene and skin antiseptic. It was difficult to find a safe percutaneous window for needle placement due to the neurostimulator device in the left flank subcutaneous tissues and bowel adjacent to the left kidney. Skin was anesthetized with 1% lidocaine and a 22 gauge needle was directed between the ribs and away from the bowel using ultrasound guidance. Needle was directed into a dilated calyx. Thick yellow fluid was slowly draining out of the needle hub. A 0.018 wire was advanced to the renal collecting system with ultrasound guidance. Needle was exchanged for an Accustick dilator set. A J wire was advanced into the renal collecting system using ultrasound and fluoroscopic guidance and the tract was dilated to accommodate a 10.2 [place]  multipurpose drain. Drain was reconstituted in the renal collecting system, presumably the renal pelvis. Approximately 40 mL of thick yellow opaque fluid was removed from the left renal collecting system. Small amount of contrast was injected to confirm placement in the renal collecting system and the catheter was flushed with a small amount of normal saline. Catheter was attached to a gravity bag. Catheter was sutured to skin. FINDINGS: Moderate left hydronephrosis. 10 French drain was successfully placed in the left renal collecting system and 40 mL of purulent looking fluid was removed. IMPRESSION: Successful left percutaneous nephrostomy tube placement with ultrasound and fluoroscopic guidance. 40 mL of purulent looking fluid was removed from the left renal collecting system and fluid was sent for culture. Electronically Signed   By: Richarda OverlieAdam  Henn M.D.   On: 12/08/2018 17:33    Labs:  CBC: Recent Labs    10/19/18 1255 12/07/18 1624 12/08/18 0608 12/09/18 0526  WBC 6.4 6.0 6.3 7.0  HGB 11.7* 10.4* 10.6* 11.4*  HCT 38.3 33.3* 33.7* 37.0  PLT 205 186 225 123*    COAGS: Recent Labs    08/22/18 0016 08/26/18 0839 09/01/18 0237 12/08/18 1029  INR 1.80 2.80 1.24 1.08  APTT  --  57*  --   --     BMP: Recent Labs    10/19/18 1255 12/07/18 1624 12/08/18 0608 12/09/18 0526  NA 143 135 137 133*  K 3.4* 3.2* 3.4* 3.4*  CL 109 100 105 101  CO2 28 24 23 23   GLUCOSE 87 97 90 126*  BUN 15 25* 19 16  CALCIUM 9.1 9.1 8.7* 8.6*  CREATININE 0.92 1.63* 1.33* 1.24*  GFRNONAA >60 32* 40* 44*  GFRAA >60 37* 47* 51*    LIVER FUNCTION TESTS: Recent Labs    08/31/18 0320 09/01/18 0907 12/07/18 1624 12/08/18 0608  BILITOT 0.7 1.0 1.0 0.5  AST 57* 36 14* 11*  ALT 23 18 7 5   ALKPHOS 106 89 66 66  PROT 5.5* 5.2* 7.5 6.5  ALBUMIN 2.0* 2.0* 3.6 2.9*    Assessment and Plan: Pt with hx chronic left ureteral stricture/obstruction/hydronehrosis/pyelonephritis, prior stone dz; s/p left PCN  1/31; afebrile; WBC nl; hgb 11.4, creat 1.24(1.33); urine cx pend; maintain PCN for now - urology plans noted for nephrectomy in near future   Electronically Signed: D. Jeananne Rama, PA-C 12/09/2018, 10:11 AM   I spent a total of 15 minutes at the the patient's bedside AND on the patient's hospital floor or unit, greater than 50% of which was counseling/coordinating care for left nephrostomy    Patient ID: [name], female   DOB: [birth year], 73 y.o.   MRN: [id number]

## 2018-12-10 DIAGNOSIS — N390 Urinary tract infection, site not specified: Secondary | ICD-10-CM

## 2018-12-10 LAB — URINE CULTURE: Culture: >=100000 — AB

## 2018-12-10 LAB — RENAL FUNCTION PANEL
Albumin: 2.8 g/dL — ABNORMAL LOW (ref 3.5–5.0)
Anion gap: 6 (ref 5–15)
BUN: 14 mg/dL (ref 8–23)
CO2: 28 mmol/L (ref 22–32)
Calcium: 8.9 mg/dL (ref 8.9–10.3)
Chloride: 105 mmol/L (ref 98–111)
Creatinine, Ser: 1.1 mg/dL — ABNORMAL HIGH (ref 0.44–1.00)
GFR calc Af Amer: 59 mL/min — ABNORMAL LOW (ref >60)
GFR calc non Af Amer: 51 mL/min — ABNORMAL LOW (ref >60)
Glucose, Bld: 103 mg/dL — ABNORMAL HIGH (ref 70–99)
Phosphorus: 3.2 mg/dL (ref 2.5–4.6)
Potassium: 4.1 mmol/L (ref 3.5–5.1)
Sodium: 139 mmol/L (ref 135–145)

## 2018-12-10 LAB — MAGNESIUM: Magnesium: 2.5 mg/dL — ABNORMAL HIGH (ref 1.7–2.4)

## 2018-12-10 MED ORDER — SODIUM CHLORIDE 0.9 % IV SOLN
1.0000 g | Freq: Four times a day (QID) | INTRAVENOUS | Status: DC
  Administered 2018-12-10 – 2018-12-11 (×5): 1 g via INTRAVENOUS
  Filled 2018-12-10: qty 1000
  Filled 2018-12-10 (×5): qty 1

## 2018-12-10 NOTE — Progress Notes (Signed)
PROGRESS NOTE    [name]  YOV:[id number] DOB: 22-Feb-1946 DOA: 12/07/2018 PCP: Donato Schultz, DO  Outpatient Specialists:   Brief Narrative:  [name] is a 73 year old female with past medical history significant for left ureteric stricture diagnosed in December last year, previous left ureteric stone with hydronephrosis, previous attempted laser ablation and dilation of stricture which was unsuccessful, history of ADD, hypertension, fibromyalgia, anxiety disorder and essential tremors.  Patient has had nephrostomy tube placed previously due to ureteric stricture.  Patient was on conservative management due to failed endoscopic management by urology.  Patient presented with confusion and hallucinations.  Her husband called the urology clinic and they were referred to the ER.  In the ER, the patient was evaluated and found to have mild delirium which is improving.  There was evidence of UTI from urinalysis and CT kidney stone protocol showed persistent left hydronephrosis.  Patient was admitted for further assessment and management.  Patient left-sided placement of left nephrostomy tube by IR.  Urology plans to proceed with left nephrectomy eventually.  12/10/2018: Initial urine cultures growing Enterococcus faecalis.  Repeat urine culture (1 out of 2) is growing Staphylococcus species.  We will follow final urine culture results.  Apparently, patient had an appointment with for an echocardiogram and query stress test tomorrow, but this will need to be canceled.  Patient was to need cardiac work-up prior to nephrectomy.  Assessment & Plan:   Active Problems:   Hyperlipidemia   GERD   Essential hypertension   Sepsis (HCC)   Delirium   Hydronephrosis of left kidney   Pyelonephritis   Malnutrition of moderate degree  left hydronephrosis:  Most likely cause of delirium.  Patient has failed previous attempts at endoscopic intervention.  She has left ureteric stricture which could be  the cause.  Recommendation from urology is to reinsert nephrostomy tube by IR.  We will admit the patient stabilized the patient and get IR consultation. 12/09/2018: Patient has undergone left-sided nephrostomy tube placement by interventional radiology team.  Urology plans eventual nephrectomy. 12/10/2018: First urine culture is growing Enterococcus faecalis.  See Rocephin.  Start IV ampicillin.  Repeat urine culture is growing Staphylococcus aureus, but this may be contaminant.  Follow final cultures..  Acute toxic encephalopathy:  Most likely due to complicated UTI/pyelonephritis from left kidney stricture.  Continue supportive care.  Patient is more awake now. 12/09/2018: Resolved significantly.    Acute complicated UTI/pyelonephritis:  Based on her urinalysis.  She has large to moderate leukocyte Estrace and bacteria.  IV Rocephin initiated will continue.  Await urine culture sensitivity results. Repeat urinalysis and urine culture. Continue antibiotics. 12/10/2018: First urine culture is growing Enterococcus faecalis.  See Rocephin.  Start IV ampicillin.  Repeat urine culture is growing Staphylococcus aureus, but this may be contaminant.  Follow final cultures..  Hypertension:  Blood pressure at this point is stable.  Continue home regimen and monitor.  Hyperlipidemia:  Continue with statin.  GERD: Continue with PPIs.  Hypokalemia:  Resolved.   Continue to replete and monitor.     DVT prophylaxis: Heparin Code Status: Full code Family Communication:  Disposition Plan:  This will depend on hospital course. Consults called: Urology and interventional radiology   Procedures:   Left-sided nephrostomy tube placed by interventional radiology  Antimicrobials:   IV Rocephin   Subjective: No fever or chills No obvious confusion  Objective: Vitals:   12/09/18 0900 12/09/18 1353 12/09/18 2007 12/10/18 0538  BP: 137/89 (!) 147/95 [name])  118/91 130/82  Pulse: 95 85 96 65    Resp: 18 18 18 15   Temp: 98.8 F (37.1 C) 99.7 F (37.6 C) 98.8 F (37.1 C) 98.3 F (36.8 C)  TempSrc: Oral Oral Oral Oral  SpO2: 95% 94% 99% 96%  Weight:      Height:        Intake/Output Summary (Last 24 hours) at 12/10/2018 1547 Last data filed at 12/10/2018 0600 Gross per 24 hour  Intake -  Output 955 ml  Net -955 ml   Filed Weights   12/07/18 1547  Weight: 62.1 kg    Examination:  General exam: Appears calm and comfortable.  Left-sided nephrostomy tube in place. Respiratory system: Clear to auscultation.  Cardiovascular system: S1 & S2 heard.  No pedal edema. Gastrointestinal system: Abdomen is nondistended, soft and nontender. No organomegaly or masses felt. Normal bowel sounds heard. Central nervous system: Alert and oriented.  Patient moves all limbs. Extremities: No edema    Data Reviewed: I have personally reviewed following labs and imaging studies  CBC: Recent Labs  Lab 12/07/18 1624 12/08/18 0608 12/09/18 0526  WBC 6.0 6.3 7.0  HGB 10.4* 10.6* 11.4*  HCT 33.3* 33.7* 37.0  MCV 91.0 90.6 88.5  PLT 186 225 123*   Basic Metabolic Panel: Recent Labs  Lab 12/07/18 1624 12/08/18 0608 12/09/18 0526 12/10/18 0527  NA 135 137 133* 139  K 3.2* 3.4* 3.4* 4.1  CL 100 105 101 105  CO2 24 23 23 28   GLUCOSE 97 90 126* 103*  BUN 25* 19 16 14   CREATININE 1.63* 1.33* 1.24* 1.10*  CALCIUM 9.1 8.7* 8.6* 8.9  MG  --   --  2.1 2.5*  PHOS  --   --   --  3.2   GFR: Estimated Creatinine Clearance: 42.8 mL/min (A) (by C-G formula based on SCr of 1.1 mg/dL (H)). Liver Function Tests: Recent Labs  Lab 12/07/18 1624 12/08/18 0608 12/10/18 0527  AST 14* 11*  --   ALT 7 5  --   ALKPHOS 66 66  --   BILITOT 1.0 0.5  --   PROT 7.5 6.5  --   ALBUMIN 3.6 2.9* 2.8*   Recent Labs  Lab 12/07/18 1624  LIPASE 25   No results for input(s): AMMONIA in the last 168 hours. Coagulation Profile: Recent Labs  Lab 12/08/18 1029  INR 1.08   Cardiac Enzymes: No  results for input(s): CKTOTAL, CKMB, CKMBINDEX, TROPONINI in the last 168 hours. BNP (last 3 results) No results for input(s): PROBNP in the last 8760 hours. HbA1C: No results for input(s): HGBA1C in the last 72 hours. CBG: No results for input(s): GLUCAP in the last 168 hours. Lipid Profile: No results for input(s): CHOL, HDL, LDLCALC, TRIG, CHOLHDL, LDLDIRECT in the last 72 hours. Thyroid Function Tests: No results for input(s): TSH, T4TOTAL, FREET4, T3FREE, THYROIDAB in the last 72 hours. Anemia Panel: No results for input(s): VITAMINB12, FOLATE, FERRITIN, TIBC, IRON, RETICCTPCT in the last 72 hours. Urine analysis:    Component Value Date/Time   COLORURINE YELLOW 12/07/2018 1908   APPEARANCEUR HAZY (A) 12/07/2018 1908   LABSPEC 1.024 12/07/2018 1908   PHURINE 5.0 12/07/2018 1908   GLUCOSEU NEGATIVE 12/07/2018 1908   HGBUR NEGATIVE 12/07/2018 1908   HGBUR negative 10/28/2009 0000   BILIRUBINUR NEGATIVE 12/07/2018 1908   BILIRUBINUR negative 03/24/2015 1615   KETONESUR NEGATIVE 12/07/2018 1908   PROTEINUR 100 (A) 12/07/2018 1908   UROBILINOGEN 2.0 03/24/2015 1615   UROBILINOGEN  1.0 03/06/2015 2030   NITRITE NEGATIVE 12/07/2018 1908   LEUKOCYTESUR MODERATE (A) 12/07/2018 1908   Sepsis Labs: @LABRCNTIP (procalcitonin:4,lacticidven:4)  ) Recent Results (from the past 240 hour(s))  Urine culture     Status: Abnormal   Collection Time: 12/07/18  7:08 PM  Result Value Ref Range Status   Specimen Description   Final    URINE, RANDOM Performed at [facility], [address] [place], [place] [address]    Special Requests   Final    NONE Performed at [facility], [address] [place], [place] [address]    Culture >=100,000 COLONIES/mL ENTEROCOCCUS FAECALIS (A)  Final   Report Status 12/10/2018 FINAL  Final   Organism ID, Bacteria ENTEROCOCCUS FAECALIS (A)  Final      Susceptibility   Enterococcus faecalis - MIC*    AMPICILLIN <=2  SENSITIVE Sensitive     LEVOFLOXACIN >=8 RESISTANT Resistant     NITROFURANTOIN <=16 SENSITIVE Sensitive     VANCOMYCIN 1 SENSITIVE Sensitive     * >=100,000 COLONIES/mL ENTEROCOCCUS FAECALIS  MRSA PCR Screening     Status: Abnormal   Collection Time: 12/07/18 11:12 PM  Result Value Ref Range Status   MRSA by PCR POSITIVE (A) NEGATIVE Final    Comment:        The GeneXpert MRSA Assay (FDA approved for NASAL specimens only), is one component of a comprehensive MRSA colonization surveillance program. It is not intended to diagnose MRSA infection nor to guide or monitor treatment for MRSA infections. RESULT CALLED TO, READ BACK BY AND VERIFIED WITHKathleen Lime: E CAUDLE RN [phone] 12/08/18 A NAVARRO Performed at [facility], [address] [place], [place] [address]   Urine culture     Status: Abnormal (Preliminary result)   Collection Time: 12/08/18  5:17 PM  Result Value Ref Range Status   Specimen Description   Final    KIDNEY LEFT Performed at [facility], [address] [place], [place] [address]    Special Requests   Final    URINE, CATHETERIZED Performed at [facility], [address] [place], [place] [address]    Culture (A)  Final    >=100,000 COLONIES/mL STAPHYLOCOCCUS AUREUS SUSCEPTIBILITIES TO FOLLOW Performed at [facility], [address] [place], [place] [address]    Report Status PENDING  Incomplete         Radiology Studies: Ir Nephrostomy Placement Left  Result Date: 12/08/2018 INDICATION: 73 year old with chronic left ureter high-grade stricture/obstruction with left hydronephrosis. Patient presented with confusion, left flank pain and pyelonephritis. EXAM: PERCUTANEOUS LEFT NEPHROSTOMY TUBE WITH ULTRASOUND AND FLUOROSCOPIC GUIDANCE COMPARISON:  None. MEDICATIONS: Ancef 2 g; The antibiotic was administered in an appropriate time frame prior to skin puncture. ANESTHESIA/SEDATION: Fentanyl 25 mcg  IV; Versed 1.0 mg IV Moderate Sedation Time:  28 minutes The patient was continuously monitored during the procedure by the interventional radiology nurse under my direct supervision. CONTRAST:  5 mL Isovue-300-administered into the collecting system(s) FLUOROSCOPY TIME:  Fluoroscopy Time: 1 minutes, 54 seconds; 23 mGy COMPLICATIONS: None immediate. PROCEDURE: Informed written consent was obtained from the patient after a thorough discussion of the procedural risks, benefits and alternatives. All questions were addressed. A timeout was performed prior to the initiation of the procedure. Patient was placed prone on the interventional table. The left kidney was identified with ultrasound. Left flank was prepped and draped in a sterile fashion. Maximal barrier sterile technique was utilized including  caps, mask, sterile gowns, sterile gloves, sterile drape, hand hygiene and skin antiseptic. It was difficult to find a safe percutaneous window for needle placement due to the neurostimulator device in the left flank subcutaneous tissues and bowel adjacent to the left kidney. Skin was anesthetized with 1% lidocaine and a 22 gauge needle was directed between the ribs and away from the bowel using ultrasound guidance. Needle was directed into a dilated calyx. Thick yellow fluid was slowly draining out of the needle hub. A 0.018 wire was advanced to the renal collecting system with ultrasound guidance. Needle was exchanged for an Accustick dilator set. A J wire was advanced into the renal collecting system using ultrasound and fluoroscopic guidance and the tract was dilated to accommodate a 10.2 [place] multipurpose drain. Drain was reconstituted in the renal collecting system, presumably the renal pelvis. Approximately 40 mL of thick yellow opaque fluid was removed from the left renal collecting system. Small amount of contrast was injected to confirm placement in the renal collecting system and the catheter was flushed with  a small amount of normal saline. Catheter was attached to a gravity bag. Catheter was sutured to skin. FINDINGS: Moderate left hydronephrosis. 10 French drain was successfully placed in the left renal collecting system and 40 mL of purulent looking fluid was removed. IMPRESSION: Successful left percutaneous nephrostomy tube placement with ultrasound and fluoroscopic guidance. 40 mL of purulent looking fluid was removed from the left renal collecting system and fluid was sent for culture. Electronically Signed   By: Richarda OverlieAdam  Henn M.D.   On: 12/08/2018 17:33        Scheduled Meds: . amitriptyline  75 mg Oral QHS  . amphetamine-dextroamphetamine  20 mg Oral Daily  . Chlorhexidine Gluconate Cloth  6 each Topical Q0600  . heparin  5,000 Units Subcutaneous Q8H  . mupirocin ointment  1 application Nasal BID  . venlafaxine XR  150 mg Oral Daily   Continuous Infusions: . sodium chloride 75 mL/hr at 12/09/18 0628  . ampicillin (OMNIPEN) IV 1 g (12/10/18 1157)     LOS: 3 days    Time spent: 25 minutes.   Berton MountSylvester Grisel Blumenstock, MD  [facility] Pager #: [phone] 7PM-7AM contact night coverage as above

## 2018-12-11 ENCOUNTER — Encounter (HOSPITAL_COMMUNITY): Payer: Medicare Other

## 2018-12-11 ENCOUNTER — Other Ambulatory Visit (HOSPITAL_COMMUNITY): Payer: Medicare Other

## 2018-12-11 LAB — URINE CULTURE: Culture: NO GROWTH

## 2018-12-11 MED ORDER — VANCOMYCIN HCL 10 G IV SOLR: 1250.0000 mg | INTRAVENOUS | Status: DC

## 2018-12-11 MED ORDER — VANCOMYCIN HCL 10 G IV SOLR
1500.0000 mg | Freq: Once | INTRAVENOUS | Status: AC
  Administered 2018-12-11: 1500 mg via INTRAVENOUS
  Filled 2018-12-11: qty 1500

## 2018-12-11 NOTE — Telephone Encounter (Signed)
Please let them know that I will order a stress test for tomorrow.

## 2018-12-11 NOTE — Telephone Encounter (Signed)
Contacted the pts husband to let him know that per Dr Delton See, she will order for her to have a stress test done tomorrow. Husband verbalized understanding and agrees with this plan.  Husband gracious for all the assistance provided.

## 2018-12-11 NOTE — Consult Note (Signed)
Urology Inpatient Progress Report  Ureteral stricture, left [N13.5]   Intv/Subj: No acute events overnight. Patient is without complaint. The patient continues to feel pain in the left rib area and posterior flank.  No fevers or chills.  She is denies any nausea.  She is tolerating her food well.  She is able to move around.  She is voiding on her own.  Active Problems:   Hyperlipidemia   GERD   Essential hypertension   Sepsis (HCC)   Delirium   Hydronephrosis of left kidney   Pyelonephritis   Malnutrition of moderate degree  Current Facility-Administered Medications  Medication Dose Route Frequency Provider Last Rate Last Dose  . 0.45 % sodium chloride infusion   Intravenous Continuous Rometta Emery, MD 75 mL/hr at 12/11/18 [phone]    . amitriptyline (ELAVIL) tablet 75 mg  75 mg Oral QHS Rometta Emery, MD   75 mg at 12/10/18 2207  . amphetamine-dextroamphetamine (ADDERALL XR) 24 hr capsule 20 mg  20 mg Oral Daily Rometta Emery, MD   20 mg at 12/11/18 0835  . Chlorhexidine Gluconate Cloth 2 % PADS 6 each  6 each Topical Q0600 Rometta Emery, MD   6 each at 12/11/18 0617  . clonazePAM (KLONOPIN) disintegrating tablet 0.125 mg  0.125 mg Oral Daily PRN Earlie Lou L, MD   0.125 mg at 12/08/18 2030  . heparin injection 5,000 Units  5,000 Units Subcutaneous Q8H Brayton El, PA-C   5,000 Units at 12/11/18 1529  . mupirocin ointment (BACTROBAN) 2 % 1 application  1 application Nasal BID Rometta Emery, MD   1 application at 12/11/18 1233  . ondansetron (ZOFRAN) tablet 4 mg  4 mg Oral Q6H PRN Rometta Emery, MD       Or  . ondansetron (ZOFRAN) injection 4 mg  4 mg Intravenous Q6H PRN Earlie Lou L, MD      . oxyCODONE (Oxy IR/ROXICODONE) immediate release tablet 15 mg  15 mg Oral Q6H PRN Bodenheimer, Charles A, NP   15 mg at 12/11/18 0841  . SUMAtriptan (IMITREX) tablet 50 mg  50 mg Oral Q2H PRN Berton Mount I, MD   50 mg at 12/10/18 0843  . [START ON  12/13/2018] vancomycin (VANCOCIN) 1,250 mg in sodium chloride 0.9 % 250 mL IVPB  1,250 mg Intravenous Q36H Hessie Knows, RPH      . venlafaxine XR (EFFEXOR-XR) 24 hr capsule 150 mg  150 mg Oral Daily Earlie Lou L, MD   150 mg at 12/11/18 0836     Objective: Vital: Vitals:   12/10/18 1955 12/11/18 0545 12/11/18 1343 12/11/18 1346  BP: 131/83 126/75 (!) 165/120 130/87  Pulse: 82 68 82 79  Resp: 18 17 (!) 22 20  Temp: 98.7 F (37.1 C) 98.1 F (36.7 C) 98.4 F (36.9 C) 98.4 F (36.9 C)  TempSrc: Oral Oral    SpO2: 98% 98% 99% 100%  Weight:      Height:       I/Os: I/O last 3 completed shifts: In: 3383.2 [P.O.:1080; I.V.:1900; IV Piggyback:403.2] Out: 2857 [Urine:2855; Stool:2]  Physical Exam:  General: Patient is in no apparent distress Lungs: Normal respiratory effort, chest expands symmetrically. GI: The abdomen is soft and nontender without mass.  The nephrostomy to is draining scant purulent material Foley: out Ext: lower extremities symmetric  Lab Results: Recent Labs    12/09/18 0526  WBC 7.0  HGB 11.4*  HCT 37.0   Recent Labs  12/09/18 0526 12/10/18 0527  NA 133* 139  K 3.4* 4.1  CL 101 105  CO2 23 28  GLUCOSE 126* 103*  BUN 16 14  CREATININE 1.24* 1.10*  CALCIUM 8.6* 8.9   No results for input(s): LABPT, INR in the last 72 hours. No results for input(s): LABURIN in the last 72 hours. Results for orders placed or performed during the hospital encounter of 12/07/18  Urine culture     Status: Abnormal   Collection Time: 12/07/18  7:08 PM  Result Value Ref Range Status   Specimen Description   Final    URINE, RANDOM Performed at [facility], [address] [place], [place] [address]    Special Requests   Final    NONE Performed at [facility], [address] [place], [place] [address]    Culture >=100,000 COLONIES/mL ENTEROCOCCUS FAECALIS (A)  Final   Report Status 12/10/2018 FINAL  Final    Organism ID, Bacteria ENTEROCOCCUS FAECALIS (A)  Final      Susceptibility   Enterococcus faecalis - MIC*    AMPICILLIN <=2 SENSITIVE Sensitive     LEVOFLOXACIN >=8 RESISTANT Resistant     NITROFURANTOIN <=16 SENSITIVE Sensitive     VANCOMYCIN 1 SENSITIVE Sensitive     * >=100,000 COLONIES/mL ENTEROCOCCUS FAECALIS  MRSA PCR Screening     Status: Abnormal   Collection Time: 12/07/18 11:12 PM  Result Value Ref Range Status   MRSA by PCR POSITIVE (A) NEGATIVE Final    Comment:        The GeneXpert MRSA Assay (FDA approved for NASAL specimens only), is one component of a comprehensive MRSA colonization surveillance program. It is not intended to diagnose MRSA infection nor to guide or monitor treatment for MRSA infections. RESULT CALLED TO, READ BACK BY AND VERIFIED WITHKathleen Lime: E CAUDLE RN [phone] 12/08/18 A NAVARRO Performed at [facility], [address] [place], [place] [address]   Urine culture     Status: Abnormal   Collection Time: 12/08/18  5:17 PM  Result Value Ref Range Status   Specimen Description   Final    KIDNEY LEFT Performed at [facility], [address] [place], [place] [address]    Special Requests   Final    URINE, CATHETERIZED Performed at [facility], [address] [place], [place] [address]    Culture (A)  Final    >=100,000 COLONIES/mL METHICILLIN RESISTANT STAPHYLOCOCCUS AUREUS   Report Status 12/11/2018 FINAL  Final   Organism ID, Bacteria METHICILLIN RESISTANT STAPHYLOCOCCUS AUREUS (A)  Final      Susceptibility   Methicillin resistant staphylococcus aureus - MIC*    CIPROFLOXACIN >=8 RESISTANT Resistant     ERYTHROMYCIN >=8 RESISTANT Resistant     GENTAMICIN <=0.5 SENSITIVE Sensitive     OXACILLIN >=4 RESISTANT Resistant     TETRACYCLINE <=1 SENSITIVE Sensitive     VANCOMYCIN <=0.5 SENSITIVE Sensitive     TRIMETH/SULFA <=10 SENSITIVE Sensitive     CLINDAMYCIN <=0.25 SENSITIVE Sensitive      RIFAMPIN <=0.5 SENSITIVE Sensitive     Inducible Clindamycin NEGATIVE Sensitive     * >=100,000 COLONIES/mL METHICILLIN RESISTANT STAPHYLOCOCCUS AUREUS  Culture, Urine     Status: None   Collection Time: 12/09/18  1:07 PM  Result Value Ref Range Status   Specimen Description URINE, RANDOM  Final   Special Requests   Final    NONE Performed at [facility], [address] [place], [place] [address]    Culture NO GROWTH  Final   Report Status 12/11/2018 FINAL  Final    Studies/Results: No results found.  Assessment:  the patient has a nonfunctioning left infected kidney.  It has now been drained.  She is being treated with broad-spectrum antibiotics.  Her cultures are growing both staph and Enterococcus  Plan:  I had a long conversation with the patient and her husband again.  Our plan is to proceed with planning of a left-sided radical nephrectomy.  This would be done in a robotic fashion, laparoscopically.  At the same time we would potentially remove her battery box from her spinal cord stimulator which she is not using.  The box has been creating irritation and pain.  I will try to touch base with Dr. Noel Geroldohen and see if he has any thoughts about this.  We will also explore the left flank hematoma and evacuate that if possible.    The patient does need cardiac clearance, and is scheduled for echocardiogram tomorrow.  She should be placed on antibiotics for 2 weeks total.  Will try to get this surgery expedited for her.   Berniece SalinesBenjamin Paizleigh Wilds, MD Urology 12/11/2018, 5:41 PM

## 2018-12-11 NOTE — Progress Notes (Signed)
PROGRESS NOTE    [name]  ZOX:[id number] DOB: 1946/02/11 DOA: 12/07/2018 PCP: Donato Schultz, DO  Outpatient Specialists:   Brief Narrative:  [name] is a 73 year old female with past medical history significant for left ureteric stricture diagnosed in December last year, previous left ureteric stone with hydronephrosis, previous attempted laser ablation and dilation of stricture which was unsuccessful, history of ADD, hypertension, fibromyalgia, anxiety disorder and essential tremors.  Patient has had nephrostomy tube placed previously due to ureteric stricture.  Patient was on conservative management due to failed endoscopic management by urology.  Patient presented with confusion and hallucinations.  Her husband called the urology clinic and they were referred to the ER.  In the ER, the patient was evaluated and found to have mild delirium which is improving.    CT kidney stone protocol showed persistent left hydronephrosis.   Patient had  left-sided placement of left nephrostomy tube by IR 1/31.  Urology plans to proceed with left nephrectomy eventually.     Assessment & Plan:   Active Problems:   Hyperlipidemia   GERD   Essential hypertension   Sepsis (HCC)   Delirium   Hydronephrosis of left kidney   Pyelonephritis   Malnutrition of moderate degree  left hydronephrosis:  Most likely cause of delirium.  Patient has failed previous attempts at endoscopic intervention.  She has left ureteric stricture which could be the cause.  Recommendation from urology is to reinsert nephrostomy tube by IR which was completed 1/31.  According to urology there was very purulent urine aspirated from her kidney.  She will need her kidney removed in the near future.  Urology plans eventual nephrectomy.patient is scheduled for follow-up next week with Crist Fat, MD. Urine culture showing MRSA. Patient started on vancomycin IV. Sensitivity pending, hence not stable for discharge  today, to determine need for continued IV antibiotics. She should remain on culture specific antibiotics for at least 14 days.  Patient to undergo nuclear stress test today , for preoperative clearance as Per Dr Delton See.   Acute toxic encephalopathy:  Most likely due to complicated UTI/pyelonephritis from left kidney stricture.  Continue supportive care.  Patient is more awake now. 12/09/2018: Resolved significantly.    Acute complicated UTI/pyelonephritis:  Based on her urinalysis.  She has large to moderate leukocyte Estrace and bacteria.   Rocephin switched to IV vancomycin to cover MRSA. Follow-up sensitivity,    Hypertension:  Blood pressure at this point is stable.  Continue home regimen and monitor.  Hyperlipidemia:  Continue with statin.  GERD: Continue with PPIs.  Hypokalemia:  Resolved.   Continue to replete and monitor.     DVT prophylaxis: Heparin Code Status: Full code Family Communication:  Disposition Plan:  not stable for discharge today. Consults called: Urology and interventional radiology   Procedures:   Left-sided nephrostomy tube placed by interventional radiology  Antimicrobials:   IV Rocephin   Subjective: Resting comfortably in bed, no flank pain, nephrostomy tube draining  Objective:   Vitals:   12/10/18 0538 12/10/18 1554 12/10/18 1955 12/11/18 0545  BP: 130/82 118/72 131/83 126/75  Pulse: 65 80 82 68  Resp: 15 20 18 17   Temp: 98.3 F (36.8 C) 98.6 F (37 C) 98.7 F (37.1 C) 98.1 F (36.7 C)  TempSrc: Oral Oral Oral Oral  SpO2: 96% 94% 98% 98%  Weight:      Height:        Intake/Output Summary (Last 24 hours) at 12/11/2018 1138  Last data filed at 12/11/2018 [id number] Gross per 24 hour  Intake 3143.17 ml  Output 1577 ml  Net 1566.17 ml   Filed Weights   12/07/18 1547  Weight: 62.1 kg    Examination:  General exam: Appears calm and comfortable.  Left-sided nephrostomy tube in place. Respiratory system: Clear to  auscultation.  Cardiovascular system: S1 & S2 heard.  No pedal edema. Gastrointestinal system: Abdomen is nondistended, soft and nontender. No organomegaly or masses felt. Normal bowel sounds heard. Central nervous system: Alert and oriented.  Patient moves all limbs. Extremities: No edema    Data Reviewed: I have personally reviewed following labs and imaging studies  CBC: Recent Labs  Lab 12/07/18 1624 12/08/18 0608 12/09/18 0526  WBC 6.0 6.3 7.0  HGB 10.4* 10.6* 11.4*  HCT 33.3* 33.7* 37.0  MCV 91.0 90.6 88.5  PLT 186 225 123*   Basic Metabolic Panel: Recent Labs  Lab 12/07/18 1624 12/08/18 0608 12/09/18 0526 12/10/18 0527  NA 135 137 133* 139  K 3.2* 3.4* 3.4* 4.1  CL 100 105 101 105  CO2 24 23 23 28   GLUCOSE 97 90 126* 103*  BUN 25* 19 16 14   CREATININE 1.63* 1.33* 1.24* 1.10*  CALCIUM 9.1 8.7* 8.6* 8.9  MG  --   --  2.1 2.5*  PHOS  --   --   --  3.2   GFR: Estimated Creatinine Clearance: 42.8 mL/min (A) (by C-G formula based on SCr of 1.1 mg/dL (H)). Liver Function Tests: Recent Labs  Lab 12/07/18 1624 12/08/18 0608 12/10/18 0527  AST 14* 11*  --   ALT 7 5  --   ALKPHOS 66 66  --   BILITOT 1.0 0.5  --   PROT 7.5 6.5  --   ALBUMIN 3.6 2.9* 2.8*   Recent Labs  Lab 12/07/18 1624  LIPASE 25   No results for input(s): AMMONIA in the last 168 hours. Coagulation Profile: Recent Labs  Lab 12/08/18 1029  INR 1.08   Cardiac Enzymes: No results for input(s): CKTOTAL, CKMB, CKMBINDEX, TROPONINI in the last 168 hours. BNP (last 3 results) No results for input(s): PROBNP in the last 8760 hours. HbA1C: No results for input(s): HGBA1C in the last 72 hours. CBG: No results for input(s): GLUCAP in the last 168 hours. Lipid Profile: No results for input(s): CHOL, HDL, LDLCALC, TRIG, CHOLHDL, LDLDIRECT in the last 72 hours. Thyroid Function Tests: No results for input(s): TSH, T4TOTAL, FREET4, T3FREE, THYROIDAB in the last 72 hours. Anemia Panel: No  results for input(s): VITAMINB12, FOLATE, FERRITIN, TIBC, IRON, RETICCTPCT in the last 72 hours. Urine analysis:    Component Value Date/Time   COLORURINE YELLOW 12/07/2018 1908   APPEARANCEUR HAZY (A) 12/07/2018 1908   LABSPEC 1.024 12/07/2018 1908   PHURINE 5.0 12/07/2018 1908   GLUCOSEU NEGATIVE 12/07/2018 1908   HGBUR NEGATIVE 12/07/2018 1908   HGBUR negative 10/28/2009 0000   BILIRUBINUR NEGATIVE 12/07/2018 1908   BILIRUBINUR negative 03/24/2015 1615   KETONESUR NEGATIVE 12/07/2018 1908   PROTEINUR 100 (A) 12/07/2018 1908   UROBILINOGEN 2.0 03/24/2015 1615   UROBILINOGEN 1.0 03/06/2015 2030   NITRITE NEGATIVE 12/07/2018 1908   LEUKOCYTESUR MODERATE (A) 12/07/2018 1908   Sepsis Labs: @LABRCNTIP (procalcitonin:4,lacticidven:4)  ) Recent Results (from the past 240 hour(s))  Urine culture     Status: Abnormal   Collection Time: 12/07/18  7:08 PM  Result Value Ref Range Status   Specimen Description   Final    URINE, RANDOM Performed at  [facility], [address], [place], [place] [address]    Special Requests   Final    NONE Performed at [facility], [address], [place], [place] [address]    Culture >=100,000 COLONIES/mL ENTEROCOCCUS FAECALIS (A)  Final   Report Status 12/10/2018 FINAL  Final   Organism ID, Bacteria ENTEROCOCCUS FAECALIS (A)  Final      Susceptibility   Enterococcus faecalis - MIC*    AMPICILLIN <=2 SENSITIVE Sensitive     LEVOFLOXACIN >=8 RESISTANT Resistant     NITROFURANTOIN <=16 SENSITIVE Sensitive     VANCOMYCIN 1 SENSITIVE Sensitive     * >=100,000 COLONIES/mL ENTEROCOCCUS FAECALIS  MRSA PCR Screening     Status: Abnormal   Collection Time: 12/07/18 11:12 PM  Result Value Ref Range Status   MRSA by PCR POSITIVE (A) NEGATIVE Final    Comment:        The GeneXpert MRSA Assay (FDA approved for NASAL specimens only), is one component of a comprehensive MRSA colonization surveillance program. It is  not intended to diagnose MRSA infection nor to guide or monitor treatment for MRSA infections. RESULT CALLED TO, READ BACK BY AND VERIFIED WITHKathleen Lime RN [phone] 12/08/18 A NAVARRO Performed at [facility], [address] [place], [place] [address]   Urine culture     Status: Abnormal   Collection Time: 12/08/18  5:17 PM  Result Value Ref Range Status   Specimen Description   Final    KIDNEY LEFT Performed at [facility], [address] [place], [place] [address]    Special Requests   Final    URINE, CATHETERIZED Performed at [facility], [address], [place], [place] [address]    Culture (A)  Final    >=100,000 COLONIES/mL METHICILLIN RESISTANT STAPHYLOCOCCUS AUREUS   Report Status 12/11/2018 FINAL  Final   Organism ID, Bacteria METHICILLIN RESISTANT STAPHYLOCOCCUS AUREUS (A)  Final      Susceptibility   Methicillin resistant staphylococcus aureus - MIC*    CIPROFLOXACIN >=8 RESISTANT Resistant     ERYTHROMYCIN >=8 RESISTANT Resistant     GENTAMICIN <=0.5 SENSITIVE Sensitive     OXACILLIN >=4 RESISTANT Resistant     TETRACYCLINE <=1 SENSITIVE Sensitive     VANCOMYCIN <=0.5 SENSITIVE Sensitive     TRIMETH/SULFA <=10 SENSITIVE Sensitive     CLINDAMYCIN <=0.25 SENSITIVE Sensitive     RIFAMPIN <=0.5 SENSITIVE Sensitive     Inducible Clindamycin NEGATIVE Sensitive     * >=100,000 COLONIES/mL METHICILLIN RESISTANT STAPHYLOCOCCUS AUREUS  Culture, Urine     Status: None   Collection Time: 12/09/18  1:07 PM  Result Value Ref Range Status   Specimen Description URINE, RANDOM  Final   Special Requests   Final    NONE Performed at [facility], [address] [place], [place] [address]    Culture NO GROWTH  Final   Report Status 12/11/2018 FINAL  Final         Radiology Studies: No results found.      Scheduled Meds: . amitriptyline  75 mg Oral QHS  . amphetamine-dextroamphetamine   20 mg Oral Daily  . Chlorhexidine Gluconate Cloth  6 each Topical Q0600  . heparin  5,000 Units Subcutaneous Q8H  . mupirocin ointment  1 application Nasal BID  . venlafaxine XR  150 mg Oral Daily   Continuous Infusions: . sodium chloride 75 mL/hr at 12/11/18 0611  . [START  ON 12/13/2018] vancomycin    . vancomycin       LOS: 4 days    Time spent: 25 minutes.   Richarda Overlie, MD  [facility]   7PM-7AM contact night coverage as above

## 2018-12-11 NOTE — Telephone Encounter (Addendum)
Pts husband is calling to give an update to Dr. Delton See.  Husband states she is still at [facility], and is slowing getting better.  Pts husband did state that her Urologist from [facility] just called him on his cell to inform him that they will be scheduling the pt to have her left kidney removed, as soon as they can get it scheduled.  Husband states that Dr. Marlou Porch will be sending over a STAT cardiac clearance note to our office to advise on.  Husband states that they knew that the pt was eventually going to have to have her kidney removed, and Dr Delton See was aware of this at her last OV with the pt, but Dr Delton See advised then that she would need to have an echo and stress test prior to clearance for this surgery.  Husband is calling to inquire about needed test, prior to having surgery done.  Will she need this done at the hospital, while she is there? Informed the pts Husband that I will route this message to Dr Delton See to review and advise on.  Informed the pts husband that we will also be on the lookout for the clearance form being sent over, and will have our Pre-op Extenders review and advise on as well.  Pts husband verbalized understanding and agrees with this plan.

## 2018-12-11 NOTE — Care Management Important Message (Signed)
Important Message  Patient Details  Name: SAMON PTACEK MRN: [id number] Date of Birth: [birth year]   Medicare Important Message Given:  Yes    Caren Macadam 12/11/2018, 11:04 AMImportant Message  Patient Details  Name: AISA CECILIO MRN: [id number] Date of Birth: 1946/03/17   Medicare Important Message Given:  Yes    Caren Macadam 12/11/2018, 11:04 AM

## 2018-12-11 NOTE — Progress Notes (Signed)
Pharmacy Antibiotic Note  [name] is a 73 y.o. female admitted on 12/07/2018 with enterococcus and MRSA UTI.  Pharmacy has been consulted for vancomycin dosing.  Plan: 1) Vancomycin 1500mg  IV x 1 loading dose then 2) Vancomycin 1250mg  IV q36h - goal AUC 400-500 3) Continue to follow renal function, CBC and cultures for changes  Height: 5\' 5"  (165.1 cm) Weight: 137 lb (62.1 kg) IBW/kg (Calculated) : 57  Temp (24hrs), Avg:98.5 F (36.9 C), Min:98.1 F (36.7 C), Max:98.7 F (37.1 C)  Recent Labs  Lab 12/07/18 1624 12/08/18 0608 12/09/18 0526 12/10/18 0527  WBC 6.0 6.3 7.0  --   CREATININE 1.63* 1.33* 1.24* 1.10*    Estimated Creatinine Clearance: 42.8 mL/min (A) (by C-G formula based on SCr of 1.1 mg/dL (H)).    No Known Allergies   Thank you for allowing pharmacy to be a part of this patient's care.  Berkley Harvey 12/11/2018 10:57 AM

## 2018-12-11 NOTE — Progress Notes (Signed)
Patient set to go to nuclear medicine at [facility] tomorrow.  [facility] to arrive at 730 am to get patient to [facility] at 8 am.  Patient npo after midnight, with no caffeine.  Patient educated and aware.

## 2018-12-11 NOTE — Telephone Encounter (Signed)
Follow up   Patient's husband would like a call to discuss patient being in [facility].

## 2018-12-12 ENCOUNTER — Ambulatory Visit (HOSPITAL_COMMUNITY)
Admit: 2018-12-12 | Discharge: 2018-12-12 | Disposition: A | Discharge status: Home / Self Care | Payer: Medicare Other | Attending: Cardiology | Admitting: Cardiology

## 2018-12-12 ENCOUNTER — Telehealth: Payer: Self-pay | Admitting: Cardiology

## 2018-12-12 DIAGNOSIS — E782 Mixed hyperlipidemia: Secondary | ICD-10-CM

## 2018-12-12 DIAGNOSIS — R0602 Shortness of breath: Secondary | ICD-10-CM

## 2018-12-12 LAB — COMPREHENSIVE METABOLIC PANEL
ALBUMIN: 2.6 g/dL — AB (ref 3.5–5.0)
ALT: 7 U/L (ref 0–44)
AST: 18 U/L (ref 15–41)
Alkaline Phosphatase: 55 U/L (ref 38–126)
Anion gap: 6 (ref 5–15)
BUN: 16 mg/dL (ref 8–23)
CO2: 27 mmol/L (ref 22–32)
CREATININE: 1.09 mg/dL — AB (ref 0.44–1.00)
Calcium: 8.9 mg/dL (ref 8.9–10.3)
Chloride: 106 mmol/L (ref 98–111)
GFR calc Af Amer: 60 mL/min — ABNORMAL LOW (ref >60)
GFR calc non Af Amer: 51 mL/min — ABNORMAL LOW (ref >60)
Glucose, Bld: 91 mg/dL (ref 70–99)
Potassium: 4 mmol/L (ref 3.5–5.1)
Sodium: 139 mmol/L (ref 135–145)
Total Bilirubin: 0.2 mg/dL — ABNORMAL LOW (ref 0.3–1.2)
Total Protein: 6.3 g/dL — ABNORMAL LOW (ref 6.5–8.1)

## 2018-12-12 MED ORDER — TECHNETIUM TC 99M TETROFOSMIN IV KIT
30.0000 | PACK | Freq: Once | INTRAVENOUS | Status: AC | PRN
  Administered 2018-12-12: 30 via INTRAVENOUS

## 2018-12-12 MED ORDER — TECHNETIUM TC 99M TETROFOSMIN IV KIT
10.0000 | PACK | Freq: Once | INTRAVENOUS | Status: AC | PRN
  Administered 2018-12-12: 10 via INTRAVENOUS

## 2018-12-12 MED ORDER — SULFAMETHOXAZOLE-TRIMETHOPRIM 800-160 MG PO TABS: 1.0000 | ORAL_TABLET | Freq: Two times a day (BID) | ORAL | 0 refills | Status: DC

## 2018-12-12 MED ORDER — SULFAMETHOXAZOLE-TRIMETHOPRIM 400-80 MG PO TABS: 1.0000 | ORAL_TABLET | Freq: Two times a day (BID) | ORAL | 0 refills | Status: DC

## 2018-12-12 MED ORDER — FUROSEMIDE 40 MG PO TABS: 40.0000 mg | ORAL_TABLET | Freq: Every day | ORAL | 0 refills | Status: DC | PRN

## 2018-12-12 MED ORDER — REGADENOSON 0.4 MG/5ML IV SOLN
0.4000 mg | Freq: Once | INTRAVENOUS | Status: AC
  Administered 2018-12-12: 0.4 mg via INTRAVENOUS
  Filled 2018-12-12: qty 5

## 2018-12-12 MED ORDER — REGADENOSON 0.4 MG/5ML IV SOLN
INTRAVENOUS | Status: AC
  Filled 2018-12-12: qty 5

## 2018-12-12 MED ORDER — SULFAMETHOXAZOLE-TRIMETHOPRIM 800-160 MG PO TABS
1.0000 | ORAL_TABLET | Freq: Two times a day (BID) | ORAL | Status: DC
  Administered 2018-12-12: 1 via ORAL
  Filled 2018-12-12 (×3): qty 1

## 2018-12-12 NOTE — Progress Notes (Signed)
Patient presented for Lexiscan. Tolerated procedure well. Pending final stress imaging result.  

## 2018-12-12 NOTE — Discharge Summary (Signed)
Physician Discharge Summary  [name] MRN: [id number] DOB/AGE: 08/04/1946 73 y.o.  PCP: Donato Schultz, DO   Admit date: 12/07/2018 Discharge date: 12/12/2018  Discharge Diagnoses:    Active Problems:   Hyperlipidemia   GERD   Essential hypertension   Sepsis (HCC)   Delirium   Hydronephrosis of left kidney   Pyelonephritis   Malnutrition of moderate degree    Follow-up recommendations Follow-up with PCP in 3-5 days , including all  additional recommended appointments as below Follow-up CBC, CMP weekly for 2-3 weeks   Follow up with Crist Fat, MD for nephrectomy , continue abx for 14 days      Allergies as of 12/12/2018   No Known Allergies     Medication List    STOP taking these medications   ibuprofen 200 MG tablet Commonly known as:  ADVIL,MOTRIN     TAKE these medications   amitriptyline 75 MG tablet Commonly known as:  ELAVIL Take 75 mg by mouth at bedtime.   amphetamine-dextroamphetamine 20 MG 24 hr capsule Commonly known as:  ADDERALL XR TAKE TWO CAPSULE BY MOUTH EVERY MORNING   clonazePAM 0.5 MG tablet Commonly known as:  KLONOPIN Take 0.5 tablets (0.25 mg total) by mouth daily as needed for anxiety. What changed:    how much to take  additional instructions   furosemide 40 MG tablet Commonly known as:  LASIX Take 1 tablet (40 mg total) by mouth daily as needed for fluid or edema. Start taking on:  January 02, 2019 What changed:  These instructions start on January 02, 2019. If you are unsure what to do until then, ask your doctor or other care provider.   neomycin-bacitracin-polymyxin ointment Commonly known as:  NEOSPORIN Apply 1 application topically daily as needed for wound care.   oxyCODONE 15 MG immediate release tablet Commonly known as:  ROXICODONE Take 15 mg by mouth 3 (three) times daily.   sulfamethoxazole-trimethoprim 800-160 MG tablet Commonly known as:  BACTRIM DS,SEPTRA DS Take 1 tablet by mouth  every 12 (twelve) hours. For 2 weeks   SUMAtriptan 50 MG tablet Commonly known as:  IMITREX TAKE 1 TABLET AT ONSET OF HEADACHE. MAY REPEAT IN 2 HOURS IF NEEDED What changed:  See the new instructions.   venlafaxine XR 75 MG 24 hr capsule Commonly known as:  EFFEXOR-XR Take 2 capsules (150 mg total) by mouth daily.          Discharge Condition:  Stable   Discharge Instructions Get Medicines reviewed and adjusted: Please take all your medications with you for your next visit with your Primary MD  Please request your Primary MD to go over all hospital tests and procedure/radiological results at the follow up, please ask your Primary MD to get all Hospital records sent to his/her office.  If you experience worsening of your admission symptoms, develop shortness of breath, life threatening emergency, suicidal or homicidal thoughts you must seek medical attention immediately by calling 911 or calling your MD immediately  if symptoms less severe.  You must read complete instructions/literature along with all the possible adverse reactions/side effects for all the Medicines you take and that have been prescribed to you. Take any new Medicines after you have completely understood and accpet all the possible adverse reactions/side effects.   Do not drive when taking Pain medications.   Do not take more than prescribed Pain, Sleep and Anxiety Medications  Special Instructions: If you have smoked or chewed Tobacco  in  the last 2 yrs please stop smoking, stop any regular Alcohol  and or any Recreational drug use.  Wear Seat belts while driving.  Please note  You were cared for by a hospitalist during your hospital stay. Once you are discharged, your primary care physician will handle any further medical issues. Please note that NO REFILLS for any discharge medications will be authorized once you are discharged, as it is imperative that you return to your primary care physician (or establish  a relationship with a primary care physician if you do not have one) for your aftercare needs so that they can reassess your need for medications and monitor your lab values.     No Known Allergies    Disposition: Discharge disposition: 01-Home or Self Care        Consults: urology    Significant Diagnostic Studies:  Ct Renal Stone Study  Result Date: 12/07/2018 CLINICAL DATA:  Left flank pain.  History of kidney cancer. EXAM: CT ABDOMEN AND PELVIS WITHOUT CONTRAST TECHNIQUE: Multidetector CT imaging of the abdomen and pelvis was performed following the standard protocol without IV contrast. COMPARISON:  11/02/2018 FINDINGS: Lower chest: No acute abnormality. Hepatobiliary: No focal hepatic abnormality. Gallbladder unremarkable. Pancreas: No focal abnormality or ductal dilatation. Spleen: No focal abnormality.  Normal size. Adrenals/Urinary Tract: Moderate to severe left hydronephrosis is similar to prior study. Left ureter cannot be followed. No visible ureteral stones. No hydronephrosis on the right. No adrenal mass. Urinary bladder grossly unremarkable. Stomach/Bowel: Large stool burden throughout the colon. Postoperative changes in the stomach. No evidence of bowel obstruction. Vascular/Lymphatic: Aortic atherosclerosis. No enlarged abdominal or pelvic lymph nodes. Reproductive: Prior hysterectomy.  No adnexal masses. Other: No free fluid or free air. Musculoskeletal: No acute bony abnormality. Severe degenerative disc and facet disease throughout the lumbar spine. Compression fractures diffusely throughout the lumbar spine are stable since prior study. IMPRESSION: Stable moderate to severe left hydronephrosis. Large stool burden in the colon. Electronically Signed   By: Charlett Nose M.D.   On: 12/07/2018 18:45   Ir Nephrostomy Placement Left  Result Date: 12/08/2018 INDICATION: 73 year old with chronic left ureter high-grade stricture/obstruction with left hydronephrosis. Patient  presented with confusion, left flank pain and pyelonephritis. EXAM: PERCUTANEOUS LEFT NEPHROSTOMY TUBE WITH ULTRASOUND AND FLUOROSCOPIC GUIDANCE COMPARISON:  None. MEDICATIONS: Ancef 2 g; The antibiotic was administered in an appropriate time frame prior to skin puncture. ANESTHESIA/SEDATION: Fentanyl 25 mcg IV; Versed 1.0 mg IV Moderate Sedation Time:  28 minutes The patient was continuously monitored during the procedure by the interventional radiology nurse under my direct supervision. CONTRAST:  5 mL Isovue-300-administered into the collecting system(s) FLUOROSCOPY TIME:  Fluoroscopy Time: 1 minutes, 54 seconds; 23 mGy COMPLICATIONS: None immediate. PROCEDURE: Informed written consent was obtained from the patient after a thorough discussion of the procedural risks, benefits and alternatives. All questions were addressed. A timeout was performed prior to the initiation of the procedure. Patient was placed prone on the interventional table. The left kidney was identified with ultrasound. Left flank was prepped and draped in a sterile fashion. Maximal barrier sterile technique was utilized including caps, mask, sterile gowns, sterile gloves, sterile drape, hand hygiene and skin antiseptic. It was difficult to find a safe percutaneous window for needle placement due to the neurostimulator device in the left flank subcutaneous tissues and bowel adjacent to the left kidney. Skin was anesthetized with 1% lidocaine and a 22 gauge needle was directed between the ribs and away from the bowel using ultrasound guidance.  Needle was directed into a dilated calyx. Thick yellow fluid was slowly draining out of the needle hub. A 0.018 wire was advanced to the renal collecting system with ultrasound guidance. Needle was exchanged for an Accustick dilator set. A J wire was advanced into the renal collecting system using ultrasound and fluoroscopic guidance and the tract was dilated to accommodate a 10.2 [place] multipurpose drain.  Drain was reconstituted in the renal collecting system, presumably the renal pelvis. Approximately 40 mL of thick yellow opaque fluid was removed from the left renal collecting system. Small amount of contrast was injected to confirm placement in the renal collecting system and the catheter was flushed with a small amount of normal saline. Catheter was attached to a gravity bag. Catheter was sutured to skin. FINDINGS: Moderate left hydronephrosis. 10 French drain was successfully placed in the left renal collecting system and 40 mL of purulent looking fluid was removed. IMPRESSION: Successful left percutaneous nephrostomy tube placement with ultrasound and fluoroscopic guidance. 40 mL of purulent looking fluid was removed from the left renal collecting system and fluid was sent for culture. Electronically Signed   By: Richarda Overlie M.D.   On: 12/08/2018 17:33       Filed Weights   12/07/18 1547  Weight: 62.1 kg     Microbiology: Recent Results (from the past 240 hour(s))  Urine culture     Status: Abnormal   Collection Time: 12/07/18  7:08 PM  Result Value Ref Range Status   Specimen Description   Final    URINE, RANDOM Performed at [facility], [address] [place], [place] [address]    Special Requests   Final    NONE Performed at [facility], [address], [place], [place] [address]    Culture >=100,000 COLONIES/mL ENTEROCOCCUS FAECALIS (A)  Final   Report Status 12/10/2018 FINAL  Final   Organism ID, Bacteria ENTEROCOCCUS FAECALIS (A)  Final      Susceptibility   Enterococcus faecalis - MIC*    AMPICILLIN <=2 SENSITIVE Sensitive     LEVOFLOXACIN >=8 RESISTANT Resistant     NITROFURANTOIN <=16 SENSITIVE Sensitive     VANCOMYCIN 1 SENSITIVE Sensitive     * >=100,000 COLONIES/mL ENTEROCOCCUS FAECALIS  MRSA PCR Screening     Status: Abnormal   Collection Time: 12/07/18 11:12 PM  Result Value Ref Range Status   MRSA by PCR POSITIVE (A)  NEGATIVE Final    Comment:        The GeneXpert MRSA Assay (FDA approved for NASAL specimens only), is one component of a comprehensive MRSA colonization surveillance program. It is not intended to diagnose MRSA infection nor to guide or monitor treatment for MRSA infections. RESULT CALLED TO, READ BACK BY AND VERIFIED WITHKathleen Lime RN [id number] 12/08/18 A NAVARRO Performed at [facility], [address], [place], [place] [address]   Urine culture     Status: Abnormal   Collection Time: 12/08/18  5:17 PM  Result Value Ref Range Status   Specimen Description   Final    KIDNEY LEFT Performed at [facility], [address], [place], [place] [address]    Special Requests   Final    URINE, CATHETERIZED Performed at [facility], [address] [place], [place] [address]    Culture (A)  Final    >=100,000 COLONIES/mL METHICILLIN RESISTANT STAPHYLOCOCCUS AUREUS   Report Status 12/11/2018 FINAL  Final   Organism ID, Bacteria  METHICILLIN RESISTANT STAPHYLOCOCCUS AUREUS (A)  Final      Susceptibility   Methicillin resistant staphylococcus aureus - MIC*    CIPROFLOXACIN >=8 RESISTANT Resistant     ERYTHROMYCIN >=8 RESISTANT Resistant     GENTAMICIN <=0.5 SENSITIVE Sensitive     OXACILLIN >=4 RESISTANT Resistant     TETRACYCLINE <=1 SENSITIVE Sensitive     VANCOMYCIN <=0.5 SENSITIVE Sensitive     TRIMETH/SULFA <=10 SENSITIVE Sensitive     CLINDAMYCIN <=0.25 SENSITIVE Sensitive     RIFAMPIN <=0.5 SENSITIVE Sensitive     Inducible Clindamycin NEGATIVE Sensitive     * >=100,000 COLONIES/mL METHICILLIN RESISTANT STAPHYLOCOCCUS AUREUS  Culture, Urine     Status: None   Collection Time: 12/09/18  1:07 PM  Result Value Ref Range Status   Specimen Description URINE, RANDOM  Final   Special Requests   Final    NONE Performed at [facility], [address], [place], [place] [address]    Culture NO GROWTH  Final    Report Status 12/11/2018 FINAL  Final  Culture, blood (routine x 2)     Status: None (Preliminary result)   Collection Time: 12/11/18  6:13 PM  Result Value Ref Range Status   Specimen Description   Final    BLOOD LEFT ANTECUBITAL Performed at [facility], [address] [place], [place] [address]    Special Requests   Final    BOTTLES DRAWN AEROBIC AND ANAEROBIC Blood Culture adequate volume Performed at [facility], [address], [place], [place] [address]    Culture   Final    NO GROWTH < 12 HOURS Performed at [facility], [address], [place], [place] [address]    Report Status PENDING  Incomplete  Culture, blood (routine x 2)     Status: None (Preliminary result)   Collection Time: 12/11/18  6:13 PM  Result Value Ref Range Status   Specimen Description   Final    BLOOD LEFT ANTECUBITAL Performed at [facility], [address] [place], [place] [address]    Special Requests   Final    BOTTLES DRAWN AEROBIC AND ANAEROBIC Blood Culture adequate volume Performed at [facility], [address], [place], [place] [address]    Culture   Final    NO GROWTH < 12 HOURS Performed at [facility], [address], [place], [place] [address]    Report Status PENDING  Incomplete       Blood Culture    Component Value Date/Time   SDES  12/11/2018 1813    BLOOD LEFT ANTECUBITAL Performed at [facility], [address], [place], [place] [address]    SDES  12/11/2018 1813    BLOOD LEFT ANTECUBITAL Performed at [facility], [address], [place], [place] [address]    SPECREQUEST  12/11/2018 1813    BOTTLES DRAWN AEROBIC AND ANAEROBIC Blood Culture adequate volume Performed at [facility], [address], [place], [place] [address]    SPECREQUEST  12/11/2018 1813    BOTTLES DRAWN AEROBIC AND ANAEROBIC Blood Culture  adequate volume Performed at [facility], [address] [place], [place] [address]    CULT  12/11/2018 1813    NO GROWTH < 12 HOURS Performed at [facility], [address], [place], [place] [address]    CULT  12/11/2018 1813    NO GROWTH <  12 HOURS Performed at [facility], [address] [place], [place] [address]    REPTSTATUS PENDING 12/11/2018 1813   REPTSTATUS PENDING 12/11/2018 1813      Labs: Results for orders placed or performed during the hospital encounter of 12/07/18 (from the past 48 hour(s))  Culture, blood (routine x 2)     Status: None (Preliminary result)   Collection Time: 12/11/18  6:13 PM  Result Value Ref Range   Specimen Description      BLOOD LEFT ANTECUBITAL Performed at [facility], [address] [place], [place] [address]    Special Requests      BOTTLES DRAWN AEROBIC AND ANAEROBIC Blood Culture adequate volume Performed at [facility], [address] [place], [place] [address]    Culture      NO GROWTH < 12 HOURS Performed at [facility], [address] [place], [place] [address]    Report Status PENDING   Culture, blood (routine x 2)     Status: None (Preliminary result)   Collection Time: 12/11/18  6:13 PM  Result Value Ref Range   Specimen Description      BLOOD LEFT ANTECUBITAL Performed at [facility], [address] [place], [place] [address]    Special Requests      BOTTLES DRAWN AEROBIC AND ANAEROBIC Blood Culture adequate volume Performed at [facility], [address] [place], [place] [address]    Culture      NO GROWTH < 12 HOURS Performed at [facility], [address] [place], [place] [address]    Report Status PENDING   Comprehensive metabolic panel     Status: Abnormal   Collection Time: 12/12/18  6:35 AM  Result Value Ref Range   Sodium 139 135 - 145 mmol/L   Potassium 4.0 3.5 - 5.1  mmol/L   Chloride 106 98 - 111 mmol/L   CO2 27 22 - 32 mmol/L   Glucose, Bld 91 70 - 99 mg/dL   BUN 16 8 - 23 mg/dL   Creatinine, Ser 2.441.09 (H) 0.44 - 1.00 mg/dL   Calcium 8.9 8.9 - 01.010.3 mg/dL   Total Protein 6.3 (L) 6.5 - 8.1 g/dL   Albumin 2.6 (L) 3.5 - 5.0 g/dL   AST 18 15 - 41 U/L   ALT 7 0 - 44 U/L   Alkaline Phosphatase 55 38 - 126 U/L   Total Bilirubin 0.2 (L) 0.3 - 1.2 mg/dL   GFR calc non Af Amer 51 (L) >60 mL/min   GFR calc Af Amer 60 (L) >60 mL/min   Anion gap 6 5 - 15    Comment: Performed at [facility], [address] [place], [place] [address]     Lipid Panel     Component Value Date/Time   CHOL 182 10/28/2009 0000   TRIG 155.0 (H) 10/28/2009 0000   HDL 61.30 10/28/2009 0000   CHOLHDL 3 10/28/2009 0000   VLDL 31.0 10/28/2009 0000   LDLCALC 90 10/28/2009 0000   LDLDIRECT 129.1 07/17/2007 0840     Lab Results  Component Value Date   HGBA1C 5.3 04/22/2014     Lab Results  Component Value Date   LDLCALC 90 10/28/2009   CREATININE 1.09 (H) 12/12/2018     HPI   Lear NgRita C Parkeris a 73 year old female with past medical history significant for left ureteric stricture diagnosed in December last year, previous left ureteric  stone with hydronephrosis, previous attempted laser ablation and dilation of stricture which was unsuccessful, history of ADD, hypertension, fibromyalgia, anxiety disorder and essential tremors. Patient has had nephrostomy tubeplaced previously due to ureteric stricture. Patient was on conservative management due to failed endoscopic management by urology. Patient presented with confusion and hallucinations. Her husband called the urology clinic and they were referred to the ER. In the ER, the patient was evaluated and found to have mild delirium which is improving.   CT kidney stone protocol showed persistent left hydronephrosis.  Patient had  left-sided placement of left nephrostomy tube by IR 1/31.  Urology plans to  proceed with left nephrectomy eventually  HOSPITAL COURSE:   left hydronephrosis with MRSA UTI: Most likely cause of delirium. Patient has failed previous attempts at endoscopic intervention. She has left ureteric stricture which could be the cause. Recommendation from urology is to reinsert nephrostomy tube by IR which was completed 1/31.  According to urology there was very purulent urine aspirated from her kidney. She will need her kidney removed in the near future.  Urology plans eventual nephrectomy.patient is scheduled for follow-up next week with Crist FatHerrick, Benjamin W, MD. Urine culture showing MRSA. Patient started on vancomycin IV and subsequently switched to Bactrim after discussion with Dr. Orvan Falconerampbell , IV.  She should remain on culture specific antibiotics for at least 14 days.  she would need weekly BMP monitoring to rule out hyperkalemia secondary to Bactrim use. She should avoid NSAIDs for the same reason. Patient underwent nuclear stress test today , for preoperative clearance as Per Dr Delton SeeNelson. patient also has a vagal stimulator that would probably need to be removed by neurosurgery at the time of her nephrectomy. Blood cultures 2 were obtained , to rule out MRSA bacteremia. Final blood culture results pending at this time but negative so far  Acute toxic encephalopathy:  Most likely due to complicated UTI/pyelonephritis from left kidney stricture. Continue supportive care. Patient is more awake now. 12/09/2018: Resolved significantly.       Hypertension:  Blood pressure at this point is stable. Continue home regimen and monitor.  Hyperlipidemia:  Continue with statin.  GERD:Continue with PPIs.  Hypokalemia: Resolved.   Continue to replete and monitor.      Discharge Exam:   Blood pressure (!) 143/116, pulse 75, temperature (!) 97.4 F (36.3 C), temperature source Oral, resp. rate 14, height 5\' 5"  (1.651 m), weight 62.1 kg, SpO2 96 %.  Cardiovascular system:  S1 & S2 heard.  No pedal edema. Gastrointestinal system: Abdomen is nondistended, soft and nontender. No organomegaly or masses felt. Normal bowel sounds heard. Central nervous system: Alert and oriented.  Patient moves all limbs.    Follow-up Information    Crist FatHerrick, Benjamin W, MD. Call.   Specialty:  Urology Why:  please contact urology soon after discharge for follow-up appointments Contact information: [address] [place] [address] [phone]        Donato SchultzLowne Chase, [name], DO. Call.   Specialty:  Family Medicine Why:  Hospital follow-up in 3-5 days, repeat BMP weekly to follow potassium closely as the patient is on Bactrim Contact information: [address] [place] [address] [phone]        Lars MassonNelson, Katarina H, MD .   Specialty:  Cardiology Contact information: [address] [place] [address] [phone]           Signed: Richarda Overlieayana Phillipe Clemon 12/12/2018, 9:25 AM  Time needed to  prepare  discharge, discussed with the patient and family 35 minutes

## 2018-12-12 NOTE — Care Management Note (Signed)
Case Management Note  Patient Details  Name: SHEMICA HUSS MRN: [id number] Date of Birth: 09/23/1946  Subjective/Objective:                  discharged  Action/Plan: Discharged to home with self-care, orders checked for hhc needs. No CM needs present at time of discharge.  Patient is able to arrangement own appointments and home care.  Expected Discharge Date:  12/12/18               Expected Discharge Plan:  Home/Self Care  In-House Referral:     Discharge planning Services  CM Consult  Post Acute Care Choice:    Choice offered to:     DME Arranged:    DME Agency:     HH Arranged:    HH Agency:     Status of Service:  Completed, signed off  If discussed at [employer] of Stay Meetings, dates discussed:    Additional Comments:  Golda Acre, RN 12/12/2018, 11:14 AM

## 2018-12-12 NOTE — Telephone Encounter (Signed)
New Message    Patient calling for stress test results. 

## 2018-12-12 NOTE — Progress Notes (Signed)
12/12/2018  1815  Reviewed discharge instructions with patient. Patient verbalized understanding. Copy of discharge instructions given to patient.

## 2018-12-12 NOTE — Progress Notes (Signed)
Pharmacy Antibiotic Note  [name] is a 73 y.o. female admitted on 12/07/2018 with UTI.  Pharmacy has been consulted for Bactrim dosing and vanc will be discontinued. Patient with enterococcus in urine from 1/30 that is thought to be colonizer and then patient grew MRSA from left kidney culture on 1/31. Sensitivities of MRSA culture revealed sensitivity to Bactrim.   Plan: Bactrim DS 1 tab BID Will sign off  Height: 5\' 5"  (165.1 cm) Weight: 137 lb (62.1 kg) IBW/kg (Calculated) : 57  Temp (24hrs), Avg:98.2 F (36.8 C), Min:97.4 F (36.3 C), Max:98.6 F (37 C)  Recent Labs  Lab 12/07/18 1624 12/08/18 0608 12/09/18 0526 12/10/18 0527 12/12/18 0635  WBC 6.0 6.3 7.0  --   --   CREATININE 1.63* 1.33* 1.24* 1.10* 1.09*    Estimated Creatinine Clearance: 43.2 mL/min (A) (by C-G formula based on SCr of 1.09 mg/dL (H)).    No Known Allergies  Antimicrobials this admission: 2/2 amp >> 2/3 2/3 vanc >> 2/4 2/4 Bactrim >>    Microbiology results: 1/30 urine: enterococcus - resistant to levaquin 1/31 left kidney: MRSA 1/30 MRSA PCR: positive  Thank you for allowing pharmacy to be a part of this patient's care.  Berkley Harvey 12/12/2018 9:36 AM

## 2018-12-12 NOTE — Telephone Encounter (Signed)
I returned the pt's call who is inquiring about her Myoview results. Pt is still admitted to [facility] and Myoview was done in the hospital. I advised the pt Dr. Delton See will need to read the results and she will let her know what the test results are. Pt states she was told she could go home today and wants her test results. [name]

## 2018-12-13 ENCOUNTER — Telehealth: Payer: Self-pay | Admitting: *Deleted

## 2018-12-13 NOTE — Telephone Encounter (Signed)
Transition Care Management Follow-up Telephone Call   Date discharged? 12/12/2018  How have you been since you were released from the hospital? "Doing well, but I am going to have to have my left kidney removed this month"   Do you understand why you were in the hospital? yes   Do you understand the discharge instructions? yes   Where were you discharged to? Home    Items Reviewed:  Medications reviewed: yes  Allergies reviewed: yes  Dietary changes reviewed: yes  Referrals reviewed: yes   Functional Questionnaire:   Activities of Daily Living (ADLs):   She states they are independent in the following: ambulation, bathing and hygiene, feeding, continence, grooming, toileting and dressing States they require assistance with the following: na   Any transportation issues/concerns?: no   Any patient concerns? no   Confirmed importance and date/time of follow-up visits scheduled yes  Provider Appointment booked with Dr.Lowne 12/21/2018 @245   Confirmed with patient if condition begins to worsen call PCP or go to the ER.  Patient was given the office number and encouraged to call back with question or concerns.  : yes

## 2018-12-15 ENCOUNTER — Telehealth: Payer: Self-pay | Admitting: Cardiology

## 2018-12-15 ENCOUNTER — Other Ambulatory Visit: Payer: Self-pay | Admitting: Urology

## 2018-12-15 NOTE — Telephone Encounter (Signed)
Spoke back with the pt and informed her that I spoke with Dr Delton See about the echo, and she stated this will not be needed for clearance for her upcoming nephrectomy, and also her hospital stress test was negative.  Informed the pt that we will see her as planned for next Tuesday, to complete her cardiac clearance work-up/note, and send this off to her Surgeon.  Pt verbalized understanding and agrees with this plan.  Pt appreciative for all the assistance provided.

## 2018-12-15 NOTE — Telephone Encounter (Signed)
Spoke with the pts Husband (on [place]) and informed him that she will not have to have another stress test for Dr Delton See had this done while she was in the hospital, and as far as the echo goes, Dr Delton See will advise on this when she comes to see her next week on 2/11 in clinic.  Informed the pts Husband that at that time, she can advise on further testing if needed, and address her clearance, for her upcoming Kidney surgery, scheduled for 12/25/18.  Pts husband verbalized understanding and agrees with this plan. Husband was more than gracious for all the assistance provided.

## 2018-12-15 NOTE — Telephone Encounter (Signed)
New message      [facility] Pre-operative Risk Assessment    Request for surgical clearance:  1. What type of surgery is being performed? Left robotic nephrectomy  2. When is this surgery scheduled? 12/25/18  3. What type of clearance is required (medical clearance vs. Pharmacy clearance to hold med vs. Both)?medical  4. Are there any medications that need to be held prior to surgery and how long?none  5. Practice name and name of physician performing surgery?[facility], Dr. Louis Meckel  6. What is your office phone number [phone]   7.   What is your office fax number [phone]  8.   Anesthesia type (None, local, MAC, general) ? General   Maryjane Hurter 12/15/2018, 3:23 PM  _________________________________________________________________   (provider comments below)

## 2018-12-15 NOTE — Telephone Encounter (Signed)
Patient called stating she was in the hospital last week.  While in the hospital she had a stress test done.  However she recently received a call from our office advising her she needed to schedule a stress test she wants to know if she needs to repeat the stress test or if the one from the hospital is enough.  She also wants to know if the Echo and wait until after she has her Kidney surgery as she has to have her Kidney removed.

## 2018-12-16 LAB — CULTURE, BLOOD (ROUTINE X 2)
Culture: NO GROWTH
Culture: NO GROWTH
Special Requests: ADEQUATE
Special Requests: ADEQUATE

## 2018-12-19 ENCOUNTER — Encounter: Payer: Self-pay | Admitting: *Deleted

## 2018-12-19 ENCOUNTER — Ambulatory Visit: Payer: Medicare Other | Admitting: Cardiology

## 2018-12-19 LAB — NM MYOCAR MULTI W/SPECT W/WALL MOTION / EF
Estimated workload: 1 METS
Exercise duration (min): 5 min
Exercise duration (sec): 1 s
Peak HR: 100 {beats}/min
Rest HR: 75 {beats}/min

## 2018-12-19 NOTE — Telephone Encounter (Signed)
Will fax clearance letter made as well as this telephone encounter, to the pts Surgeon Dr. Jasmine Awe office, for clearance of upcoming Nephrectomy.  Spoke with the pt and informed her that Dr Delton See wrote her clearance note for her upcoming Nephrectomy, and we will be faxing this to her Surgeon's office today.  Pt verbalized understanding and agrees with this plan. Pt more than gracious for all the assistance provided.   Also called the pts Husband and endorsed to him that we will be faxing over her clearance note to her Surgeon's office.  Pts husband was very gracious for all the assistance provided.

## 2018-12-19 NOTE — Telephone Encounter (Signed)
To Dr. Berniece Salines,  Mrs. [name] is currently under my care for chronic diastolic CHF and dyspnea on exertion.  She was seen recently and had no signs of heart failure, she underwent nuclear stress testing that did not show significant ischemia and showed normal LVEF.  The patient is considered intermediate risk for moderate risk surgery such as left robotic nephrectomy that she is scheduled for.  I would suggest starting aspirin 81 mg daily and metoprolol 12.5 mg p.o. twice daily.  I would not postpone surgery or suggest any further testing prior to surgery.  Please call us with any questions.  We will follow the patient in the perioperative period.  Warm regards,  Tobias Alexander, MD

## 2018-12-20 ENCOUNTER — Encounter: Payer: Self-pay | Admitting: Cardiology

## 2018-12-20 NOTE — Patient Instructions (Addendum)
[name]  12/20/2018   Your procedure is scheduled on: Monday 12/25/2018  Report to [facility] Main  Entrance              Report to Short Stay  at   0530  AM    Call this number if you have problems the morning of surgery [phone]               Follow Bowel Prep instructions from Dr. Marlou Porch on Sunday 12/24/2018 and drink clear liquids all day up until midnight!    CLEAR LIQUID DIET   Foods Allowed                                                                     Foods Excluded  Coffee and tea, regular and decaf                             liquids that you cannot  Plain Jell-O in any flavor                                             see through such as: Fruit ices (not with fruit pulp)                                     milk, soups, orange juice  Iced Popsicles                                    All solid food Carbonated beverages, regular and diet                                    Cranberry, grape and apple juices Sports drinks like Gatorade Lightly seasoned clear broth or consume(fat free) Sugar, honey syrup  Sample Menu Breakfast                                Lunch                                     Supper Cranberry juice                    Beef broth                            Chicken broth Jell-O                                     Grape juice  Apple juice Coffee or tea                        Jell-O                                      Popsicle                                                Coffee or tea                        Coffee or tea  _____________________________________________________________________    Remember: Do not eat food or drink liquids :After Midnight.              BRUSH YOUR TEETH MORNING OF SURGERY AND RINSE YOUR MOUTH OUT, NO CHEWING GUM CANDY OR MINTS.     Take these medicines the morning of surgery with A SIP OF WATER: Venlafaxine (Effexor XR), Oxycodone (Roxicodone)                                  You may not have any metal on your body including hair pins and              piercings  Do not wear jewelry, make-up, lotions, powders or perfumes, deodorant             Do not wear nail polish.  Do not shave  48 hours prior to surgery.             Do not bring valuables to the hospital. [facility] IS NOT             RESPONSIBLE   FOR VALUABLES.  Contacts, dentures or bridgework may not be worn into surgery.  Leave suitcase in the car. After surgery it may be brought to your room.                   Please read over the following fact sheets you were given: _____________________________________________________________________             [facility] - Preparing for Surgery Before surgery, you can play an important role.  Because skin is not sterile, your skin needs to be as free of germs as possible.  You can reduce the number of germs on your skin by washing with CHG (chlorahexidine gluconate) soap before surgery.  CHG is an antiseptic cleaner which kills germs and bonds with the skin to continue killing germs even after washing. Please DO NOT use if you have an allergy to CHG or antibacterial soaps.  If your skin becomes reddened/irritated stop using the CHG and inform your nurse when you arrive at Short Stay. Do not shave (including legs and underarms) for at least 48 hours prior to the first CHG shower.  You may shave your face/neck. Please follow these instructions carefully:  1.  Shower with CHG Soap the night before surgery and the  morning of Surgery.  2.  If you choose to wash your hair, wash your hair first as usual with your  normal  shampoo.  3.  After you shampoo, rinse your hair and body thoroughly  to remove the  shampoo.                           4.  Use CHG as you would any other liquid soap.  You can apply chg directly  to the skin and wash                       Gently with a scrungie or clean washcloth.  5.  Apply the CHG Soap to your body ONLY  FROM THE NECK DOWN.   Do not use on face/ open                           Wound or open sores. Avoid contact with eyes, ears mouth and genitals (private parts).                       Wash face,  Genitals (private parts) with your normal soap.             6.  Wash thoroughly, paying special attention to the area where your surgery  will be performed.  7.  Thoroughly rinse your body with warm water from the neck down.  8.  DO NOT shower/wash with your normal soap after using and rinsing off  the CHG Soap.                9.  Pat yourself dry with a clean towel.            10.  Wear clean pajamas.            11.  Place clean sheets on your bed the night of your first shower and do not  sleep with pets. Day of Surgery : Do not apply any lotions/deodorants the morning of surgery.  Please wear clean clothes to the hospital/surgery center.  FAILURE TO FOLLOW THESE INSTRUCTIONS MAY RESULT IN THE CANCELLATION OF YOUR SURGERY PATIENT SIGNATURE_________________________________  NURSE SIGNATURE__________________________________  ________________________________________________________________________  WHAT IS A BLOOD TRANSFUSION? Blood Transfusion Information  A transfusion is the replacement of blood or some of its parts. Blood is made up of multiple cells which provide different functions.  Red blood cells carry oxygen and are used for blood loss replacement.  White blood cells fight against infection.  Platelets control bleeding.  Plasma helps clot blood.  Other blood products are available for specialized needs, such as hemophilia or other clotting disorders. BEFORE THE TRANSFUSION  Who gives blood for transfusions?   Healthy volunteers who are fully evaluated to make sure their blood is safe. This is blood bank blood. Transfusion therapy is the safest it has ever been in the practice of medicine. Before blood is taken from a donor, a complete history is taken to make sure that person has  no history of diseases nor engages in risky social behavior (examples are intravenous drug use or sexual activity with multiple partners). The donor's travel history is screened to minimize risk of transmitting infections, such as malaria. The donated blood is tested for signs of infectious diseases, such as HIV and hepatitis. The blood is then tested to be sure it is compatible with you in order to minimize the chance of a transfusion reaction. If you or a relative donates blood, this is often done in anticipation of surgery and is not appropriate for emergency situations. It takes many days to process the donated  blood. RISKS AND COMPLICATIONS Although transfusion therapy is very safe and saves many lives, the main dangers of transfusion include:   Getting an infectious disease.  Developing a transfusion reaction. This is an allergic reaction to something in the blood you were given. Every precaution is taken to prevent this. The decision to have a blood transfusion has been considered carefully by your caregiver before blood is given. Blood is not given unless the benefits outweigh the risks. AFTER THE TRANSFUSION  Right after receiving a blood transfusion, you will usually feel much better and more energetic. This is especially true if your red blood cells have gotten low (anemic). The transfusion raises the level of the red blood cells which carry oxygen, and this usually causes an energy increase.  The nurse administering the transfusion will monitor you carefully for complications. HOME CARE INSTRUCTIONS  No special instructions are needed after a transfusion. You may find your energy is better. Speak with your caregiver about any limitations on activity for underlying diseases you may have. SEEK MEDICAL CARE IF:   Your condition is not improving after your transfusion.  You develop redness or irritation at the intravenous (IV) site. SEEK IMMEDIATE MEDICAL CARE IF:  Any of the following  symptoms occur over the next 12 hours:  Shaking chills.  You have a temperature by mouth above 102 F (38.9 C), not controlled by medicine.  Chest, back, or muscle pain.  People around you feel you are not acting correctly or are confused.  Shortness of breath or difficulty breathing.  Dizziness and fainting.  You get a rash or develop hives.  You have a decrease in urine output.  Your urine turns a dark color or changes to pink, red, or brown. Any of the following symptoms occur over the next 10 days:  You have a temperature by mouth above 102 F (38.9 C), not controlled by medicine.  Shortness of breath.  Weakness after normal activity.  The white part of the eye turns yellow (jaundice).  You have a decrease in the amount of urine or are urinating less often.  Your urine turns a dark color or changes to pink, red, or brown. Document Released: 10/22/2000 Document Revised: 01/17/2012 Document Reviewed: 06/10/2008 [facility] Patient Information 2014 [employer], [place].  _______________________________________________________________________

## 2018-12-21 ENCOUNTER — Ambulatory Visit: Payer: Medicare Other | Admitting: Family Medicine

## 2018-12-21 ENCOUNTER — Encounter: Payer: Self-pay | Admitting: Family Medicine

## 2018-12-21 VITALS — BP 106/66 | HR 76 | Temp 98.3°F | Resp 16 | Ht 65.0 in | Wt 123.8 lb

## 2018-12-21 DIAGNOSIS — N131 Hydronephrosis with ureteral stricture, not elsewhere classified: Secondary | ICD-10-CM | POA: Diagnosis not present

## 2018-12-21 DIAGNOSIS — D649 Anemia, unspecified: Secondary | ICD-10-CM

## 2018-12-21 DIAGNOSIS — N1330 Unspecified hydronephrosis: Secondary | ICD-10-CM

## 2018-12-21 DIAGNOSIS — I10 Essential (primary) hypertension: Secondary | ICD-10-CM

## 2018-12-21 DIAGNOSIS — E785 Hyperlipidemia, unspecified: Secondary | ICD-10-CM | POA: Diagnosis not present

## 2018-12-21 LAB — COMPREHENSIVE METABOLIC PANEL
ALK PHOS: 84 U/L (ref 39–117)
ALT: 5 U/L (ref 0–35)
AST: 14 U/L (ref 0–37)
Albumin: 4 g/dL (ref 3.5–5.2)
BUN: 23 mg/dL (ref 6–23)
CO2: 29 mEq/L (ref 19–32)
CREATININE: 1.78 mg/dL — AB (ref 0.40–1.20)
Calcium: 9.9 mg/dL (ref 8.4–10.5)
Chloride: 105 mEq/L (ref 96–112)
GFR: 28.1 mL/min — ABNORMAL LOW (ref >60.00)
Glucose, Bld: 71 mg/dL (ref 70–99)
Potassium: 4.7 mEq/L (ref 3.5–5.1)
Sodium: 141 mEq/L (ref 135–145)
Total Bilirubin: 0.3 mg/dL (ref 0.2–1.2)
Total Protein: 7.4 g/dL (ref 6.0–8.3)

## 2018-12-21 LAB — LIPID PANEL
Cholesterol: 151 mg/dL (ref 0–200)
HDL: 65.1 mg/dL (ref >39.00)
LDL Cholesterol: 69 mg/dL (ref 0–99)
NonHDL: 85.7
Total CHOL/HDL Ratio: 2
Triglycerides: 82 mg/dL (ref 0.0–149.0)
VLDL: 16.4 mg/dL (ref 0.0–40.0)

## 2018-12-21 NOTE — Progress Notes (Addendum)
Patient ID: [name], female    DOB: [birth year]  Age: 73 y.o. MRN: [id number]    Subjective:  Subjective  HPI LLEWELLYN ASWAD presents for f/u from hosp for hydronephrosis/ pyleonephritis    She is scheduled for a nephrectomy on Monday.  She was in the hosp 1/30-2/4   Review of Systems  Constitutional: Positive for fatigue. Negative for appetite change, diaphoresis and unexpected weight change.  Eyes: Negative for pain, redness and visual disturbance.  Respiratory: Negative for cough, chest tightness, shortness of breath and wheezing.   Cardiovascular: Negative for chest pain, palpitations and leg swelling.  Endocrine: Negative for cold intolerance, heat intolerance, polydipsia, polyphagia and polyuria.  Genitourinary: Negative for difficulty urinating, dysuria and frequency.  Musculoskeletal: Positive for back pain.  Neurological: Positive for weakness. Negative for dizziness, light-headedness, numbness and headaches.    History Past Medical History:  Diagnosis Date  . Abrasion of right heel    during admission 10/ 2019 right heel open skin due to movement on sheet  . ADD (attention deficit disorder)   . Anemia, mild   . Anxiety   . Arthritis    "joints, back"  . At high risk for falls    10-19-2018 pt has fell twice in past week, tripped  . Chronic back pain    "all over my back" (10/02/2013)  . Crushing injury of arm, right 02/06/1989's   "it was crushed; wore cast from fingers to top of my shoulder" (11/25/2  . Crushing injury of left wrist 05/11/1989's  . DOE (dyspnea on exertion)   . Essential tremor   . Fibromyalgia   . History of palpitations 2002   recurrent  . History of panic attacks   . History of pneumonia 08/21/2018   w/ acute respiratory failure/ hypoxia  . History of sepsis    admission 08-21-2018  secondary to uropathy obstructive from ureteral stone   . Hyperlipidemia   . Hypertension    10-19-2018 PER PT AMLODIPINE ON HOLD SINCE 10/ 2019 DUE TO KIDNEY  ISSUES PER DOCTOR  . Idiopathic scoliosis 04/19/2013  . Left ureteral stone   . Migraines    "once/month" (10/02/2013)  . Osteoporosis   . Other vitamin B12 deficiency anemia   . PONV (postoperative nausea and vomiting)   . Pulmonary nodules/lesions, multiple   . Renal insufficiency   . S/P gastric bypass 09/16/2003  . S/P insertion of spinal cord stimulator    per pt remote is missing  . Wears dentures    upper  . Wears glasses   . Wears glasses     She has a past surgical history that includes Knee arthroscopy (Right, 03-31-2001    dr Simonne Come @[facility] ); Tonsillectomy and adenoidectomy (1955); Tubal ligation (1980's); Reduction mammaplasty (Bilateral, 2001); Spinal cord stimulator implant (2016); IR NEPHROSTOMY PLACEMENT LEFT (08/22/2018); Cystoscopy with ureteroscopy and stent placement (Left, 09/27/2018); Roux-en-Y Gastric Bypass (09-16-2003   dr m. Daphine Deutscher  @[facility] ); Cardiac catheterization (12-13-2002    dr Eden Emms); D & C HYSTERSCOPY RESECTION MYOMECTOMY (1980s); Abdominal hysterectomy (1980's); Nephrolithotomy (Left, 10/20/2018); and IR NEPHROSTOMY PLACEMENT LEFT (12/08/2018).   Her family history includes COPD in her mother; Emphysema in her mother; Heart attack in her mother; Other in her mother. She was adopted.She reports that she has never smoked. She has never used smokeless tobacco. She reports that she does not drink alcohol or use drugs.  Current Outpatient Medications on File Prior to Visit  Medication Sig Dispense Refill  . amitriptyline (ELAVIL) 75 MG  tablet Take 75 mg by mouth at bedtime.    [name]. amphetamine-dextroamphetamine (ADDERALL XR) 20 MG 24 hr capsule TAKE TWO CAPSULE BY MOUTH EVERY MORNING 60 capsule 0  . clonazePAM (KLONOPIN) 0.5 MG tablet Take 0.5 tablets (0.25 mg total) by mouth daily as needed for anxiety. (Patient taking differently: Take 0.125 mg by mouth daily as needed for anxiety. Takes 1/4 of a tablet (0.125 mg)) 30 tablet 1  . [START ON 01/02/2019] furosemide  (LASIX) 40 MG tablet Take 1 tablet (40 mg total) by mouth daily as needed for fluid or edema. 30 tablet 0  . neomycin-bacitracin-polymyxin (NEOSPORIN) ointment Apply 1 application topically daily as needed for wound care.    [name]. oxyCODONE (ROXICODONE) 15 MG immediate release tablet Take 15 mg by mouth 3 (three) times daily.    [name]. sulfamethoxazole-trimethoprim (BACTRIM DS,SEPTRA DS) 800-160 MG tablet Take 1 tablet by mouth every 12 (twelve) hours. For 2 weeks 36 tablet 0  . SUMAtriptan (IMITREX) 50 MG tablet TAKE 1 TABLET AT ONSET OF HEADACHE. MAY REPEAT IN 2 HOURS IF NEEDED (Patient taking differently: Take 50 mg by mouth every 2 (two) hours as needed for migraine or headache. May repeat dose in 2 hours if no relief.  Do not exceed 2 doses in 24 hours.) 10 tablet 1  . venlafaxine XR (EFFEXOR-XR) 75 MG 24 hr capsule Take 2 capsules (150 mg total) by mouth daily. 180 capsule 3   No current facility-administered medications on file prior to visit.      Objective:  Objective  Physical Exam Vitals signs and nursing note reviewed.  Constitutional:      Appearance: She is well-developed.  HENT:     Head: Normocephalic and atraumatic.  Eyes:     Conjunctiva/sclera: Conjunctivae normal.  Neck:     Musculoskeletal: Normal range of motion and neck supple.     Thyroid: No thyromegaly.     Vascular: No carotid bruit or JVD.  Cardiovascular:     Rate and Rhythm: Normal rate and regular rhythm.     Heart sounds: Normal heart sounds. No murmur.  Pulmonary:     Effort: Pulmonary effort is normal. No respiratory distress.     Breath sounds: Normal breath sounds. No wheezing or rales.  Chest:     Chest wall: No tenderness.  Musculoskeletal:     Comments: In wheelchair  Very weak   Neurological:     Mental Status: She is alert and oriented to person, place, and time.    BP 106/66 (BP Location: Left Arm, Cuff Size: Normal)   Pulse 76   Temp 98.3 F (36.8 C) (Oral)   Resp 16   Ht 5\' 5"  (1.651 m)    Wt 123 lb 12.8 oz (56.2 kg)   SpO2 97%   BMI 20.60 kg/m  Wt Readings from Last 3 Encounters:  12/21/18 123 lb 12.8 oz (56.2 kg)  12/07/18 137 lb (62.1 kg)  11/20/18 131 lb (59.4 kg)     Lab Results  Component Value Date   WBC 7.0 12/09/2018   HGB 11.4 (L) 12/09/2018   HCT 37.0 12/09/2018   PLT 123 (L) 12/09/2018   GLUCOSE 91 12/12/2018   CHOL 182 10/28/2009   TRIG 155.0 (H) 10/28/2009   HDL 61.30 10/28/2009   LDLDIRECT 129.1 07/17/2007   LDLCALC 90 10/28/2009   ALT 7 12/12/2018   AST 18 12/12/2018   NA 139 12/12/2018   K 4.0 12/12/2018   CL 106 12/12/2018   CREATININE 1.09 (  H) 12/12/2018   BUN 16 12/12/2018   CO2 27 12/12/2018   TSH 1.925 08/30/2018   INR 1.08 12/08/2018   HGBA1C 5.3 04/22/2014    Nm Myocar Multi W/spect W/wall Motion / Ef  Result Date: 12/19/2018  There was no ST segment deviation noted during stress.  No T wave inversion was noted during stress.  Defect 1: There is a small defect of mild severity present in the mid inferolateral location.  Findings consistent with ischemia.  This is a low risk study.  The left ventricular ejection fraction is normal (55-65%).  Nuclear stress EF: 64%.      Assessment & Plan:  Plan  I am having [name] maintain her amitriptyline, neomycin-bacitracin-polymyxin, clonazePAM, oxyCODONE, venlafaxine XR, SUMAtriptan, amphetamine-dextroamphetamine, furosemide, and sulfamethoxazole-trimethoprim.  No orders of the defined types were placed in this encounter.   Problem List Items Addressed This Visit      Unprioritized   ANEMIA, MILD   Relevant Orders   CBC with Differential/Platelet   Essential hypertension - Primary   Relevant Orders   CBC with Differential/Platelet   Comprehensive metabolic panel   Lipid panel   Hyperlipidemia   Relevant Orders   CBC with Differential/Platelet   Comprehensive metabolic panel   Lipid panel    Other Visit Diagnoses    Hydronephrosis with ureteral stricture, not  elsewhere classified          Pt scheduled for nephrectomy on Monday  Follow-up: Return in about 3 months (around 03/21/2019) for hypertension, hyperlipidemia, weight loss.  Donato Schultz, DO

## 2018-12-21 NOTE — Patient Instructions (Signed)
Hydronephrosis    Hydronephrosis is the swelling of one or both kidneys due to a blockage that stops urine from flowing out of the body. Kidneys filter waste from the blood and produce urine. This condition can lead to kidney failure and may become life threatening if not treated promptly.  What are the causes?  Common causes of this condition include:  · Problems that occur when a baby is developing in the womb (congenital defect). These can include problems:  ? In the kidneys.  ? In the tubes that drain urine from the kidneys into the bladder (ureters).  · Kidney stones.  · Bladder infection.  · An enlarged prostate gland.  · Scar tissue from a previous surgery or injury.  · A blood clot.  · A tumor or cyst in the abdomen or pelvis.  · Cancer of the prostate, bladder, uterus, ovary, or colon.  What are the signs or symptoms?  Symptoms of this condition include:  · Pain or discomfort in your side (flank).  · Pain and swelling in your abdomen.  · Nausea and vomiting.  · Fever.  · Pain when passing urine.  · Feelings of urgency when you need to urinate.  · Urinating more often than normal.  In some cases, you may not have any symptoms.  How is this diagnosed?  This condition may be diagnosed based on:  · Your symptoms and medical history.  · A physical exam.  · Blood and urine tests.  · Imaging tests, such as an ultrasound, CT scan, or MRI.  · A procedure in which a scope is inserted into the urethra and used to view parts of the urinary tract and bladder (cystoscopy).  How is this treated?  Treatment for this condition depends on where the blockage is, how long it has been there, and what caused it. The goal of treatment is to remove the blockage. Treatment may include:  · Antibiotic medicines to treat or prevent infection.  · A procedure to place a small, thin tube (stent) into a blocked ureter. The stent will keep the ureter open so that urine can drain through it.  · A nonsurgical procedure that crushes kidney  stones with shock waves (extracorporeal shock wave lithotripsy).  · If kidney failure occurs, treatment may include dialysis or a kidney transplant.  Follow these instructions at home:    · Take over-the-counter and prescription medicines only as told by your health care provider.  · Rest and return to your normal activities as told by your health care provider. Ask your health care provider what activities are safe for you.  · Drink enough fluid to keep your urine pale yellow.  · If you were prescribed an antibiotic medicine, take it exactly as told by your health care provider. Do not stop taking the antibiotic even if you start to feel better.  · Keep all follow-up visits as told by your health care provider. This is important.  Contact a health care provider if:  · You continue to have symptoms after treatment.  · You develop new symptoms.  · Your urine becomes cloudy or bloody.  · You have a fever.  Get help right away if:  · You have severe flank or abdominal pain.  · You cannot drink fluids without vomiting.  Summary  · Hydronephrosis is the swelling of one or both kidneys due to a blockage that stops urine from flowing out of the body.  · Hydronephrosis can lead to kidney   failure and may become life threatening if not treated promptly.  · The goal of treatment is to treat the cause of the blockage. It may include insertion of stent into a blocked ureter, a procedure to treat kidney stones, and antibiotic medicines.  · Follow your health care provider's instructions for taking care of yourself at home, including instructions about drinking fluids, taking medicines, and limiting activities.  This information is not intended to replace advice given to you by your health care provider. Make sure you discuss any questions you have with your health care provider.  Document Released: 08/22/2007 Document Revised: 11/05/2017 Document Reviewed: 11/05/2017  Elsevier Interactive Patient Education © 2019 [employer]  Inc[employer]

## 2018-12-21 NOTE — Addendum Note (Signed)
Addended by: [name] R on: 12/21/2018 02:15 PM   Modules accepted: Level of Service

## 2018-12-21 NOTE — Assessment & Plan Note (Signed)
Scheduled for nephrectomy on Monday

## 2018-12-22 ENCOUNTER — Other Ambulatory Visit: Payer: Self-pay

## 2018-12-22 ENCOUNTER — Encounter (HOSPITAL_COMMUNITY): Payer: Self-pay

## 2018-12-22 ENCOUNTER — Encounter (HOSPITAL_COMMUNITY)
Admission: RE | Admit: 2018-12-22 | Discharge: 2018-12-22 | Disposition: A | Discharge status: Home / Self Care | Payer: Medicare Other | Source: Ambulatory Visit

## 2018-12-22 DIAGNOSIS — Z01812 Encounter for preprocedural laboratory examination: Secondary | ICD-10-CM

## 2018-12-22 LAB — COMPREHENSIVE METABOLIC PANEL
ALT: 8 U/L (ref 0–44)
AST: 18 U/L (ref 15–41)
Albumin: 3.6 g/dL (ref 3.5–5.0)
Alkaline Phosphatase: 81 U/L (ref 38–126)
Anion gap: 9 (ref 5–15)
BUN: 25 mg/dL — ABNORMAL HIGH (ref 8–23)
CO2: 23 mmol/L (ref 22–32)
Calcium: 9.1 mg/dL (ref 8.9–10.3)
Chloride: 106 mmol/L (ref 98–111)
Creatinine, Ser: 1.68 mg/dL — ABNORMAL HIGH (ref 0.44–1.00)
GFR calc Af Amer: 35 mL/min — ABNORMAL LOW (ref >60)
GFR calc non Af Amer: 30 mL/min — ABNORMAL LOW (ref >60)
Glucose, Bld: 127 mg/dL — ABNORMAL HIGH (ref 70–99)
Potassium: 3.3 mmol/L — ABNORMAL LOW (ref 3.5–5.1)
Sodium: 138 mmol/L (ref 135–145)
Total Bilirubin: 0.1 mg/dL — ABNORMAL LOW (ref 0.3–1.2)
Total Protein: 7.5 g/dL (ref 6.5–8.1)

## 2018-12-22 LAB — CBC
HCT: 33.6 % — ABNORMAL LOW (ref 36.0–46.0)
Hemoglobin: 10.2 g/dL — ABNORMAL LOW (ref 12.0–15.0)
MCH: 27.5 pg (ref 26.0–34.0)
MCHC: 30.4 g/dL (ref 30.0–36.0)
MCV: 90.6 fL (ref 80.0–100.0)
Platelets: 130 10*3/uL — ABNORMAL LOW (ref 150–400)
RBC: 3.71 MIL/uL — ABNORMAL LOW (ref 3.87–5.11)
RDW: 14.6 % (ref 11.5–15.5)
WBC: 3.5 10*3/uL — ABNORMAL LOW (ref 4.0–10.5)
nRBC: 0 % (ref 0.0–0.2)

## 2018-12-22 LAB — SURGICAL PCR SCREEN
MRSA, PCR: NEGATIVE
STAPHYLOCOCCUS AUREUS: NEGATIVE

## 2018-12-22 NOTE — Progress Notes (Signed)
Anesthesia Chart Review   Case:  [id number] Date/Time:  12/25/18 0645   Procedure:  XI ROBOTIC ASSISTED LAPAROSCOPIC NEPHRECTOMY LEFT FLANK EXPLORATION WITH REMOVAL OF BATTERY PACK AND ENCAPSULATED HEMATOMA (Left )   Anesthesia type:  General   Pre-op diagnosis:  LEFT NON FUNCTIONING / INFECTED KIDNEY   Location:  [facility] ROOM 03 / [facility]   Surgeon:  Crist Fat, MD      DISCUSSION: 73 yo never smoker with h/o PONV, fibromyalgia, anxiety, renal insufficiency, HLD, s/p gastric bypass (2004), HTN, chronic pain (multple back surgeries, SCS in place that she does not use, battery possibly to be removed with above procedure), left non functioning/infected kidney scheduled for above surgery 12/25/18 with Dr. Berniece Salines.    Pt with recent admission 12/07/18-12/12/2018 due to left hydronephrosis with MRSA UTI with acute toxic encephalopathy.  She is now scheduled for above procedure.  She has been seen after hospitalization with PCP, Dr. Laury Axon, stable at this visit.   Pt last seen by cardiology, Dr. Tobias Alexander, 11/20/18.  Cardiac clearance received from Dr. Tobias Alexander 12/19/18 which state, "Mrs. [name] is currently under my care for chronic diastolic CHF and dyspnea on exertion.  She was seen recently and had no signs of heart failure, she underwent nuclear stress testing that did not show significant ischemia and showed normal LVEF.  The patient is considered intermediate risk for moderate risk surgery such as left robotic nephrectomy that she is scheduled for.  I would suggest starting aspirin 81 mg daily and metoprolol 12.5 mg p.o. twice daily.  I would not postpone surgery or suggest any further testing prior to surgery."  Pt can proceed with planned procedure barring acute status change.  VS: BP (!) 149/77   Pulse 95   Temp 37.1 C (Oral)   Resp 18   Ht 5\' 5"  (1.651 m)   Wt 58.3 kg   SpO2 95%   BMI 21.38 kg/m   PROVIDERS: Donato Schultz, DO is PCP last seen  12/21/2018  Tobias Alexander, MD is Cardiologist  LABS: Labs reviewed: Acceptable for surgery. (all labs ordered are listed, but only abnormal results are displayed)  Labs Reviewed  CBC - Abnormal; Notable for the following components:      Result Value   WBC 3.5 (*)    RBC 3.71 (*)    Hemoglobin 10.2 (*)    HCT 33.6 (*)    Platelets 130 (*)    All other components within normal limits  COMPREHENSIVE METABOLIC PANEL - Abnormal; Notable for the following components:   Potassium 3.3 (*)    Glucose, Bld 127 (*)    BUN 25 (*)    Creatinine, Ser 1.68 (*)    Total Bilirubin 0.1 (*)    GFR calc non Af Amer 30 (*)    GFR calc Af Amer 35 (*)    All other components within normal limits  SURGICAL PCR SCREEN  TYPE AND SCREEN     IMAGES:   EKG: 11/20/2018 Rate 74 bpm Sinus rhythm with occasional premature ventricular complexes Left axis deviation  Abnormal ECG   CV: Stress Test 12/12/2018  There was no ST segment deviation noted during stress.  No T wave inversion was noted during stress.  Defect 1: There is a small defect of mild severity present in the mid inferolateral location.  Findings consistent with ischemia.  This is a low risk study.  The left ventricular ejection fraction is normal (55-65%).  Nuclear  stress EF: 64%.  Echo 08/20/16 Study Conclusions  - Left ventricle: The cavity size was normal. Systolic function was   normal. The estimated ejection fraction was in the range of 60%   to 65%. Wall motion was normal; there were no regional wall   motion abnormalities. Doppler parameters are consistent with   abnormal left ventricular relaxation (grade 1 diastolic   dysfunction). - Aortic valve: Trileaflet; mildly thickened, mildly calcified   leaflets. - Aorta: Ascending aortic diameter: 38 mm (S). - Ascending aorta: The ascending aorta was mildly dilated. - Mitral valve: Calcified annulus. Mildly thickened leaflets . - Pericardium, extracardiac:  Hepatic cyst noted. Consider dedicated   abdominal ultrasound for further illustration.  Impressions:  - Compared to the prior study, there has been no significant   interval change.  Past Medical History:  Diagnosis Date  . Abrasion of right heel    during admission 10/ 2019 right heel open skin due to movement on sheet  . ADD (attention deficit disorder)   . Anemia, mild   . Anxiety   . Arthritis    "joints, back"  . At high risk for falls    10-19-2018 pt has fell twice in past week, tripped  . Chronic back pain    "all over my back" (10/02/2013)  . Crushing injury of arm, right 02/06/1989's   "it was crushed; wore cast from fingers to top of my shoulder" (11/25/2  . Crushing injury of left wrist 05/11/1989's  . DOE (dyspnea on exertion)   . Essential tremor   . Fibromyalgia   . History of palpitations 2002   recurrent  . History of panic attacks   . History of pneumonia 08/21/2018   w/ acute respiratory failure/ hypoxia  . History of sepsis    admission 08-21-2018  secondary to uropathy obstructive from ureteral stone   . Hyperlipidemia   . Hypertension    10-19-2018 PER PT AMLODIPINE ON HOLD SINCE 10/ 2019 DUE TO KIDNEY ISSUES PER DOCTOR  . Idiopathic scoliosis 04/19/2013  . Left ureteral stone   . Migraines    "once/month" (10/02/2013)  . Osteoporosis   . Other vitamin B12 deficiency anemia   . PONV (postoperative nausea and vomiting)   . Pulmonary nodules/lesions, multiple   . Renal insufficiency   . S/P gastric bypass 09/16/2003  . S/P insertion of spinal cord stimulator    per pt remote is missing  . Wears dentures    upper  . Wears glasses   . Wears glasses     Past Surgical History:  Procedure Laterality Date  . ABDOMINAL HYSTERECTOMY  1980's   W/  BSO  AND APPENDECTOMY  . CARDIAC CATHETERIZATION  12-13-2002    dr Eden Emmsnishan   normal coronaries  . CYSTOSCOPY WITH URETEROSCOPY AND STENT PLACEMENT Left 09/27/2018   Procedure: LEFT  URETEROSCOPY,  HOLMIUM LASER LITHOTRIPSY;  Surgeon: Crist FatHerrick, Benjamin W, MD;  Location: [facility];  Service: Urology;  Laterality: Left;  . D & C HYSTERSCOPY RESECTION MYOMECTOMY  1980s  . IR NEPHROSTOMY PLACEMENT LEFT  08/22/2018  . IR NEPHROSTOMY PLACEMENT LEFT  12/08/2018  . KNEE ARTHROSCOPY Right 03-31-2001    dr Simonne Comeaplington @[facility]   . NEPHROLITHOTOMY Left 10/20/2018   Procedure: LEFT NEPHROLITHOTOMY PERCUTANEOUS;  Surgeon: Crist FatHerrick, Benjamin W, MD;  Location: [facility];  Service: Urology;  Laterality: Left;  . REDUCTION MAMMAPLASTY Bilateral 2001  . ROUX-EN-Y GASTRIC BYPASS  09-16-2003   dr Judie Petitm. Daphine Deutschermartin  @[facility]    via Laparoscopy w/ gastrojejunostomy  .  SPINAL CORD STIMULATOR IMPLANT  2016   10-19-2018  PER PT HAS REMOTE BUT HAS NOT USED IT SINCE DEC 2018  . TONSILLECTOMY AND ADENOIDECTOMY  1955  . TUBAL LIGATION  1980's    MEDICATIONS: . amitriptyline (ELAVIL) 75 MG tablet  . amphetamine-dextroamphetamine (ADDERALL XR) 20 MG 24 hr capsule  . clonazePAM (KLONOPIN) 0.5 MG tablet  . [START ON 01/02/2019] furosemide (LASIX) 40 MG tablet  . neomycin-bacitracin-polymyxin (NEOSPORIN) ointment  . oxyCODONE (ROXICODONE) 15 MG immediate release tablet  . sulfamethoxazole-trimethoprim (BACTRIM DS,SEPTRA DS) 800-160 MG tablet  . SUMAtriptan (IMITREX) 50 MG tablet  . venlafaxine XR (EFFEXOR-XR) 75 MG 24 hr capsule   No current facility-administered medications for this encounter.     Jodell Cipro, PA-C [facility] [phone] 12/22/18 10:30 AM

## 2018-12-22 NOTE — Anesthesia Preprocedure Evaluation (Addendum)
Anesthesia Evaluation  Patient identified by MRN, date of birth, ID band Patient awake    Reviewed: Allergy & Precautions, NPO status , Patient's Chart, lab work & pertinent test results  History of Anesthesia Complications (+) PONV and history of anesthetic complications  Airway Mallampati: I  TM Distance: >3 FB Neck ROM: Full    Dental  (+) Edentulous Upper, Edentulous Lower, Dental Advisory Given   Pulmonary neg pulmonary ROS,    Pulmonary exam normal        Cardiovascular hypertension, negative cardio ROS Normal cardiovascular exam  Stress Test 12/12/2018  There was no ST segment deviation noted during stress.  No T wave inversion was noted during stress.  Defect 1: There is a small defect of mild severity present in the mid inferolateral location.  Findings consistent with ischemia.  This is a low risk study.  The left ventricular ejection fraction is normal (55-65%).  Nuclear stress EF: 64%.    Neuro/Psych  Headaches, PSYCHIATRIC DISORDERS Anxiety Depression    GI/Hepatic Neg liver ROS, GERD  ,  Endo/Other  negative endocrine ROS  Renal/GU Renal InsufficiencyRenal disease     Musculoskeletal  (+) Arthritis , Fibromyalgia -, narcotic dependent  Abdominal   Peds  Hematology negative hematology ROS (+)   Anesthesia Other Findings Day of surgery medications reviewed with the patient.  Reproductive/Obstetrics                           Anesthesia Physical Anesthesia Plan  ASA: III  Anesthesia Plan: General   Post-op Pain Management:    Induction: Intravenous  PONV Risk Score and Plan: 4 or greater and Ondansetron, Dexamethasone and Diphenhydramine  Airway Management Planned: Oral ETT  Additional Equipment:   Intra-op Plan:   Post-operative Plan: Extubation in OR  Informed Consent: I have reviewed the patients History and Physical, chart, labs and discussed the  procedure including the risks, benefits and alternatives for the proposed anesthesia with the patient or authorized representative who has indicated his/her understanding and acceptance.     Dental advisory given  Plan Discussed with: CRNA and Anesthesiologist  Anesthesia Plan Comments: (See PST note 12/22/18, Jodell Cipro, PA-C)      Anesthesia Quick Evaluation

## 2018-12-24 LAB — URINE CULTURE: Culture: >=100000 — AB

## 2018-12-25 ENCOUNTER — Encounter (HOSPITAL_COMMUNITY)
Admission: RE | Disposition: A | Discharge status: Home / Self Care | Payer: Self-pay | Source: Home / Self Care | Attending: Urology

## 2018-12-25 ENCOUNTER — Other Ambulatory Visit: Payer: Self-pay

## 2018-12-25 ENCOUNTER — Inpatient Hospital Stay (HOSPITAL_COMMUNITY)
Admission: RE | Admit: 2018-12-25 | Discharge: 2018-12-28 | DRG: 661 | Disposition: A | Discharge status: Home / Self Care | Payer: Medicare Other | Attending: Urology | Admitting: Urology

## 2018-12-25 ENCOUNTER — Inpatient Hospital Stay (HOSPITAL_COMMUNITY): Payer: Medicare Other | Admitting: Certified Registered Nurse Anesthetist

## 2018-12-25 ENCOUNTER — Encounter (HOSPITAL_COMMUNITY): Payer: Self-pay | Admitting: *Deleted

## 2018-12-25 DIAGNOSIS — Z79891 Long term (current) use of opiate analgesic: Secondary | ICD-10-CM

## 2018-12-25 DIAGNOSIS — Z79899 Other long term (current) drug therapy: Secondary | ICD-10-CM | POA: Diagnosis not present

## 2018-12-25 DIAGNOSIS — Z9682 Presence of neurostimulator: Secondary | ICD-10-CM

## 2018-12-25 DIAGNOSIS — N135 Crossing vessel and stricture of ureter without hydronephrosis: Secondary | ICD-10-CM | POA: Diagnosis present

## 2018-12-25 DIAGNOSIS — N289 Disorder of kidney and ureter, unspecified: Secondary | ICD-10-CM | POA: Diagnosis present

## 2018-12-25 DIAGNOSIS — Z87440 Personal history of urinary (tract) infections: Secondary | ICD-10-CM | POA: Diagnosis not present

## 2018-12-25 HISTORY — PX: ROBOT ASSISTED LAPAROSCOPIC NEPHRECTOMY: SHX5140

## 2018-12-25 LAB — TYPE AND SCREEN
ABO/RH(D): A POS
Antibody Screen: NEGATIVE

## 2018-12-25 LAB — HEMOGLOBIN AND HEMATOCRIT, BLOOD
HCT: 33 % — ABNORMAL LOW (ref 36.0–46.0)
HEMOGLOBIN: 10 g/dL — AB (ref 12.0–15.0)

## 2018-12-25 SURGERY — NEPHRECTOMY, RADICAL, ROBOT-ASSISTED, LAPAROSCOPIC, ADULT
Anesthesia: General | Laterality: Left

## 2018-12-25 MED ORDER — VENLAFAXINE HCL ER 150 MG PO CP24
150.0000 mg | ORAL_CAPSULE | Freq: Every day | ORAL | Status: DC
  Administered 2018-12-26 – 2018-12-28 (×3): 150 mg via ORAL
  Filled 2018-12-25 (×3): qty 1

## 2018-12-25 MED ORDER — DIPHENHYDRAMINE HCL 50 MG/ML IJ SOLN
INTRAMUSCULAR | Status: AC
  Filled 2018-12-25: qty 1

## 2018-12-25 MED ORDER — OXYCODONE HCL 5 MG PO TABS
15.0000 mg | ORAL_TABLET | Freq: Three times a day (TID) | ORAL | Status: DC
  Administered 2018-12-25 – 2018-12-26 (×4): 15 mg via ORAL
  Filled 2018-12-25 (×5): qty 3

## 2018-12-25 MED ORDER — OXYCODONE HCL 15 MG PO TABS: 15.0000 mg | ORAL_TABLET | Freq: Three times a day (TID) | ORAL | 0 refills | Status: DC

## 2018-12-25 MED ORDER — LIDOCAINE 20MG/ML (2%) 15 ML SYRINGE OPTIME
INTRAMUSCULAR | Status: DC | PRN
  Administered 2018-12-25: 1.5 mg/kg/h via INTRAVENOUS

## 2018-12-25 MED ORDER — DIPHENHYDRAMINE HCL 50 MG/ML IJ SOLN: 12.5000 mg | Freq: Four times a day (QID) | INTRAMUSCULAR | Status: DC | PRN

## 2018-12-25 MED ORDER — LACTATED RINGERS IV SOLN
INTRAVENOUS | Status: DC
  Administered 2018-12-25 (×2): via INTRAVENOUS

## 2018-12-25 MED ORDER — MIDAZOLAM HCL 2 MG/2ML IJ SOLN
INTRAMUSCULAR | Status: AC
  Filled 2018-12-25: qty 2

## 2018-12-25 MED ORDER — SODIUM CHLORIDE (PF) 0.9 % IJ SOLN
INTRAMUSCULAR | Status: DC | PRN
  Administered 2018-12-25: 20 mL

## 2018-12-25 MED ORDER — SODIUM CHLORIDE 0.9 % IV SOLN
1.0000 g | Freq: Once | INTRAVENOUS | Status: AC
  Administered 2018-12-25: 1 g via INTRAVENOUS
  Filled 2018-12-25: qty 1

## 2018-12-25 MED ORDER — SODIUM CHLORIDE 0.9 % IV SOLN
INTRAVENOUS | Status: AC
  Filled 2018-12-25: qty 500000

## 2018-12-25 MED ORDER — DEXAMETHASONE SODIUM PHOSPHATE 10 MG/ML IJ SOLN
INTRAMUSCULAR | Status: AC
  Filled 2018-12-25: qty 1

## 2018-12-25 MED ORDER — LIDOCAINE HCL (CARDIAC) PF 100 MG/5ML IV SOSY
PREFILLED_SYRINGE | INTRAVENOUS | Status: DC | PRN
  Administered 2018-12-25: 70 mg via INTRAVENOUS

## 2018-12-25 MED ORDER — BUPIVACAINE LIPOSOME 1.3 % IJ SUSP
20.0000 mL | Freq: Once | INTRAMUSCULAR | Status: AC
  Administered 2018-12-25: 20 mL
  Filled 2018-12-25: qty 20

## 2018-12-25 MED ORDER — SODIUM CHLORIDE 0.9 % IV SOLN
2.0000 g | INTRAVENOUS | Status: AC
  Administered 2018-12-25: 2 g via INTRAVENOUS
  Filled 2018-12-25: qty 20

## 2018-12-25 MED ORDER — BUPIVACAINE HCL (PF) 0.25 % IJ SOLN
INTRAMUSCULAR | Status: AC
  Filled 2018-12-25: qty 30

## 2018-12-25 MED ORDER — BUPIVACAINE HCL (PF) 0.25 % IJ SOLN
INTRAMUSCULAR | Status: DC | PRN
  Administered 2018-12-25: 30 mL

## 2018-12-25 MED ORDER — ROCURONIUM BROMIDE 100 MG/10ML IV SOLN
INTRAVENOUS | Status: AC
  Filled 2018-12-25: qty 1

## 2018-12-25 MED ORDER — FENTANYL CITRATE (PF) 250 MCG/5ML IJ SOLN
INTRAMUSCULAR | Status: AC
  Filled 2018-12-25: qty 5

## 2018-12-25 MED ORDER — ACETAMINOPHEN 10 MG/ML IV SOLN
1000.0000 mg | Freq: Four times a day (QID) | INTRAVENOUS | Status: AC
  Administered 2018-12-25 – 2018-12-26 (×3): 1000 mg via INTRAVENOUS
  Filled 2018-12-25 (×4): qty 100

## 2018-12-25 MED ORDER — ACETAMINOPHEN 500 MG PO TABS
1000.0000 mg | ORAL_TABLET | Freq: Once | ORAL | Status: AC
  Administered 2018-12-25: 1000 mg via ORAL
  Filled 2018-12-25: qty 2

## 2018-12-25 MED ORDER — BUPIVACAINE HCL (PF) 0.25 % IJ SOLN
INTRAMUSCULAR | Status: DC | PRN
  Administered 2018-12-25: 16 mL

## 2018-12-25 MED ORDER — SODIUM CHLORIDE (PF) 0.9 % IJ SOLN
INTRAMUSCULAR | Status: AC
  Filled 2018-12-25: qty 20

## 2018-12-25 MED ORDER — PROPOFOL 10 MG/ML IV BOLUS
INTRAVENOUS | Status: AC
  Filled 2018-12-25: qty 20

## 2018-12-25 MED ORDER — MIDAZOLAM HCL 5 MG/5ML IJ SOLN
INTRAMUSCULAR | Status: DC | PRN
  Administered 2018-12-25 (×2): 1 mg via INTRAVENOUS

## 2018-12-25 MED ORDER — DIPHENHYDRAMINE HCL 50 MG/ML IJ SOLN
INTRAMUSCULAR | Status: DC | PRN
  Administered 2018-12-25: 12.5 mg via INTRAVENOUS

## 2018-12-25 MED ORDER — BELLADONNA ALKALOIDS-OPIUM 16.2-60 MG RE SUPP: 1.0000 | Freq: Four times a day (QID) | RECTAL | Status: DC | PRN

## 2018-12-25 MED ORDER — ONDANSETRON HCL 4 MG/2ML IJ SOLN
INTRAMUSCULAR | Status: AC
  Filled 2018-12-25: qty 2

## 2018-12-25 MED ORDER — SUGAMMADEX SODIUM 200 MG/2ML IV SOLN
INTRAVENOUS | Status: AC
  Filled 2018-12-25: qty 2

## 2018-12-25 MED ORDER — PROPOFOL 10 MG/ML IV BOLUS
INTRAVENOUS | Status: DC | PRN
  Administered 2018-12-25: 120 mg via INTRAVENOUS

## 2018-12-25 MED ORDER — SUGAMMADEX SODIUM 200 MG/2ML IV SOLN
INTRAVENOUS | Status: DC | PRN
  Administered 2018-12-25: 125 mg via INTRAVENOUS

## 2018-12-25 MED ORDER — STERILE WATER FOR IRRIGATION IR SOLN
Status: DC | PRN
  Administered 2018-12-25: 1000 mL

## 2018-12-25 MED ORDER — DIPHENHYDRAMINE HCL 12.5 MG/5ML PO ELIX: 12.5000 mg | ORAL_SOLUTION | Freq: Four times a day (QID) | ORAL | Status: DC | PRN

## 2018-12-25 MED ORDER — AMITRIPTYLINE HCL 25 MG PO TABS
75.0000 mg | ORAL_TABLET | Freq: Every day | ORAL | Status: DC
  Administered 2018-12-25 – 2018-12-27 (×3): 75 mg via ORAL
  Filled 2018-12-25 (×3): qty 3

## 2018-12-25 MED ORDER — FENTANYL CITRATE (PF) 100 MCG/2ML IJ SOLN
INTRAMUSCULAR | Status: AC
  Filled 2018-12-25: qty 4

## 2018-12-25 MED ORDER — ROCURONIUM BROMIDE 100 MG/10ML IV SOLN
INTRAVENOUS | Status: DC | PRN
  Administered 2018-12-25: 70 mg via INTRAVENOUS
  Administered 2018-12-25 (×3): 10 mg via INTRAVENOUS

## 2018-12-25 MED ORDER — LACTATED RINGERS IR SOLN
Status: DC | PRN
  Administered 2018-12-25: 1000 mL

## 2018-12-25 MED ORDER — AMPHETAMINE-DEXTROAMPHET ER 20 MG PO CP24
40.0000 mg | ORAL_CAPSULE | Freq: Every day | ORAL | Status: DC
  Administered 2018-12-26 – 2018-12-28 (×3): 40 mg via ORAL
  Filled 2018-12-25 (×3): qty 2

## 2018-12-25 MED ORDER — HYDROMORPHONE HCL 1 MG/ML IJ SOLN
0.5000 mg | INTRAMUSCULAR | Status: DC | PRN
  Administered 2018-12-25 – 2018-12-26 (×4): 1 mg via INTRAVENOUS
  Filled 2018-12-25 (×4): qty 1

## 2018-12-25 MED ORDER — DEXTROSE-NACL 5-0.45 % IV SOLN
INTRAVENOUS | Status: DC
  Administered 2018-12-25 – 2018-12-26 (×2): via INTRAVENOUS

## 2018-12-25 MED ORDER — LIDOCAINE 2% (20 MG/ML) 5 ML SYRINGE
INTRAMUSCULAR | Status: AC
  Filled 2018-12-25: qty 5

## 2018-12-25 MED ORDER — SODIUM CHLORIDE 0.9 % IV SOLN
INTRAVENOUS | Status: DC | PRN
  Administered 2018-12-25: 500 mL

## 2018-12-25 MED ORDER — ONDANSETRON HCL 4 MG/2ML IJ SOLN
INTRAMUSCULAR | Status: DC | PRN
  Administered 2018-12-25: 4 mg via INTRAVENOUS

## 2018-12-25 MED ORDER — DEXAMETHASONE SODIUM PHOSPHATE 10 MG/ML IJ SOLN
INTRAMUSCULAR | Status: DC | PRN
  Administered 2018-12-25: 8 mg via INTRAVENOUS

## 2018-12-25 MED ORDER — PROMETHAZINE HCL 25 MG/ML IJ SOLN: 6.2500 mg | INTRAMUSCULAR | Status: DC | PRN

## 2018-12-25 MED ORDER — FENTANYL CITRATE (PF) 100 MCG/2ML IJ SOLN
25.0000 ug | INTRAMUSCULAR | Status: DC | PRN
  Administered 2018-12-25 (×2): 50 ug via INTRAVENOUS

## 2018-12-25 MED ORDER — FENTANYL CITRATE (PF) 100 MCG/2ML IJ SOLN
INTRAMUSCULAR | Status: DC | PRN
  Administered 2018-12-25 (×2): 50 ug via INTRAVENOUS
  Administered 2018-12-25: 100 ug via INTRAVENOUS
  Administered 2018-12-25: 50 ug via INTRAVENOUS

## 2018-12-25 MED ORDER — ONDANSETRON HCL 4 MG/2ML IJ SOLN: 4.0000 mg | INTRAMUSCULAR | Status: DC | PRN

## 2018-12-25 MED ORDER — NITROFURANTOIN MONOHYD MACRO 100 MG PO CAPS: 100.0000 mg | ORAL_CAPSULE | Freq: Two times a day (BID) | ORAL | 0 refills | Status: AC

## 2018-12-25 SURGICAL SUPPLY — 62 items
APPLICATOR SURGIFLO ENDO (HEMOSTASIS) IMPLANT
BAG DECANTER FOR FLEXI CONT (MISCELLANEOUS) ×3 IMPLANT
BAG LAPAROSCOPIC 12 15 PORT 16 (BASKET) ×1 IMPLANT
BAG RETRIEVAL 12/15 (BASKET) ×2
BAG RETRIEVAL 12/15MM (BASKET) ×1
CHLORAPREP W/TINT 26ML (MISCELLANEOUS) ×3 IMPLANT
CLIP VESOLOCK LG 6/CT PURPLE (CLIP) ×3 IMPLANT
CLIP VESOLOCK MED LG 6/CT (CLIP) ×3 IMPLANT
CLIP VESOLOCK XL 6/CT (CLIP) ×3 IMPLANT
CLOSURE WOUND 1/2 X4 (GAUZE/BANDAGES/DRESSINGS) ×1
COVER SURGICAL LIGHT HANDLE (MISCELLANEOUS) ×3 IMPLANT
COVER TIP SHEARS 8 DVNC (MISCELLANEOUS) ×2 IMPLANT
COVER TIP SHEARS 8MM DA VINCI (MISCELLANEOUS) ×4
COVER WAND RF STERILE (DRAPES) IMPLANT
CUTTER ECHEON FLEX ENDO 45 340 (ENDOMECHANICALS) ×3 IMPLANT
DECANTER SPIKE VIAL GLASS SM (MISCELLANEOUS) ×3 IMPLANT
DERMABOND ADVANCED (GAUZE/BANDAGES/DRESSINGS) ×4
DERMABOND ADVANCED .7 DNX12 (GAUZE/BANDAGES/DRESSINGS) ×2 IMPLANT
DRAPE ARM DVNC X/XI (DISPOSABLE) ×4 IMPLANT
DRAPE COLUMN DVNC XI (DISPOSABLE) ×1 IMPLANT
DRAPE DA VINCI XI ARM (DISPOSABLE) ×8
DRAPE DA VINCI XI COLUMN (DISPOSABLE) ×2
DRAPE INCISE IOBAN 66X45 STRL (DRAPES) ×3 IMPLANT
DRAPE SHEET LG 3/4 BI-LAMINATE (DRAPES) ×3 IMPLANT
ELECT PENCIL ROCKER SW 15FT (MISCELLANEOUS) ×3 IMPLANT
ELECT REM PT RETURN 15FT ADLT (MISCELLANEOUS) ×3 IMPLANT
GLOVE BIOGEL M STRL SZ7.5 (GLOVE) ×6 IMPLANT
GOWN STRL REUS W/TWL LRG LVL3 (GOWN DISPOSABLE) ×12 IMPLANT
HEMOSTAT SURGICEL 4X8 (HEMOSTASIS) ×3 IMPLANT
IRRIG SUCT STRYKERFLOW 2 WTIP (MISCELLANEOUS) ×3
IRRIGATION SUCT STRKRFLW 2 WTP (MISCELLANEOUS) ×1 IMPLANT
KIT BASIN OR (CUSTOM PROCEDURE TRAY) ×3 IMPLANT
NEEDLE INSUFFLATION 14GA 120MM (NEEDLE) ×3 IMPLANT
NS IRRIG 1000ML POUR BTL (IV SOLUTION) ×3 IMPLANT
PAD POSITIONING PINK XL (MISCELLANEOUS) ×3 IMPLANT
PORT ACCESS TROCAR AIRSEAL 12 (TROCAR) ×1 IMPLANT
PORT ACCESS TROCAR AIRSEAL 5M (TROCAR) ×2
PROTECTOR NERVE ULNAR (MISCELLANEOUS) ×6 IMPLANT
SEAL CANN UNIV 5-8 DVNC XI (MISCELLANEOUS) ×4 IMPLANT
SEAL XI 5MM-8MM UNIVERSAL (MISCELLANEOUS) ×8
SET TRI-LUMEN FLTR TB AIRSEAL (TUBING) ×3 IMPLANT
SOLUTION ELECTROLUBE (MISCELLANEOUS) ×3 IMPLANT
STAPLE RELOAD 45 WHT (STAPLE) ×3 IMPLANT
STAPLE RELOAD 45MM WHITE (STAPLE) ×6
STRIP CLOSURE SKIN 1/2X4 (GAUZE/BANDAGES/DRESSINGS) ×2 IMPLANT
SURGIFLO W/THROMBIN 8M KIT (HEMOSTASIS) IMPLANT
SUT MNCRL AB 4-0 PS2 18 (SUTURE) ×9 IMPLANT
SUT PDS AB 0 CTX 60 (SUTURE) ×6 IMPLANT
SUT VIC AB 0 CT1 27 (SUTURE) ×2
SUT VIC AB 0 CT1 27XBRD ANTBC (SUTURE) ×1 IMPLANT
SUT VIC AB 2-0 SH 27 (SUTURE)
SUT VIC AB 2-0 SH 27X BRD (SUTURE) IMPLANT
SUT VIC AB 3-0 SH 27 (SUTURE) ×4
SUT VIC AB 3-0 SH 27XBRD (SUTURE) ×2 IMPLANT
SUT VICRYL 0 UR6 27IN ABS (SUTURE) IMPLANT
SYR BULB IRRIGATION 50ML (SYRINGE) ×3 IMPLANT
TOWEL OR 17X26 10 PK STRL BLUE (TOWEL DISPOSABLE) ×3 IMPLANT
TOWEL OR NON WOVEN STRL DISP B (DISPOSABLE) ×3 IMPLANT
TRAY FOLEY MTR SLVR 16FR STAT (SET/KITS/TRAYS/PACK) ×3 IMPLANT
TRAY LAPAROSCOPIC (CUSTOM PROCEDURE TRAY) ×3 IMPLANT
TROCAR XCEL 12X100 BLDLESS (ENDOMECHANICALS) IMPLANT
WATER STERILE IRR 1000ML POUR (IV SOLUTION) ×3 IMPLANT

## 2018-12-25 NOTE — Anesthesia Procedure Notes (Signed)
Procedure Name: Intubation Date/Time: 12/25/2018 7:32 AM Performed by: Thornell Mule, CRNA Pre-anesthesia Checklist: Patient identified, Emergency Drugs available, Suction available and Patient being monitored Patient Re-evaluated:Patient Re-evaluated prior to induction Oxygen Delivery Method: Circle system utilized Preoxygenation: Pre-oxygenation with 100% oxygen Induction Type: IV induction Ventilation: Mask ventilation without difficulty Laryngoscope Size: Miller and 3 Grade View: Grade I Tube type: Oral Tube size: 7.0 mm Number of attempts: 1 Airway Equipment and Method: Stylet and Oral airway Placement Confirmation: ETT inserted through vocal cords under direct vision,  positive ETCO2 and breath sounds checked- equal and bilateral Secured at: 20 cm Tube secured with: Tape Dental Injury: Teeth and Oropharynx as per pre-operative assessment

## 2018-12-25 NOTE — Interval H&P Note (Signed)
History and Physical Interval Note:  No changes. NAD Vitals:   12/25/18 0540  BP: (!) 177/99  Pulse: 77  Resp: 16  Temp: (!) 97.3 F (36.3 C)  TempSrc: Oral  SpO2: 92%   CTA-B RRR  Plan to proceed with left nephrectomy, removal of her spinal cord stimulator, and possibly removal/evacuation of her encapsulated hematoma.  12/25/2018 7:13 AM  [name]  has presented today for surgery, with the diagnosis of LEFT NON FUNCTIONING / INFECTED KIDNEY  The various methods of treatment have been discussed with the patient and family. After consideration of risks, benefits and other options for treatment, the patient has consented to  Procedure(s): XI ROBOTIC ASSISTED LAPAROSCOPIC NEPHRECTOMY LEFT FLANK EXPLORATION WITH REMOVAL OF BATTERY PACK AND ENCAPSULATED HEMATOMA (Left) as a surgical intervention .  The patient's history has been reviewed, patient examined, no change in status, stable for surgery.  I have reviewed the patient's chart and labs.  Questions were answered to the patient's satisfaction.     Crist Fat

## 2018-12-25 NOTE — Transfer of Care (Signed)
Immediate Anesthesia Transfer of Care Note  Patient: [name]  Procedure(s) Performed: XI ROBOTIC ASSISTED LAPAROSCOPIC NEPHRECTOMY, LEFT FLANK EXPLORATION WITH REMOVAL OF BATTERY PACK (Left )  Patient Location: PACU  Anesthesia Type:General  Level of Consciousness: drowsy, patient cooperative and responds to stimulation  Airway & Oxygen Therapy: Patient Spontanous Breathing and Patient connected to nasal cannula oxygen  Post-op Assessment: Report given to RN and Post -op Vital signs reviewed and stable  Post vital signs: Reviewed and stable  Last Vitals:  Vitals Value Taken Time  BP 156/92 12/25/2018 11:30 AM  Temp    Pulse 68 12/25/2018 11:32 AM  Resp 15 12/25/2018 11:32 AM  SpO2 100 % 12/25/2018 11:32 AM  Vitals shown include unvalidated device data.  Last Pain:  Vitals:   12/25/18 0554  TempSrc:   PainSc: 0-No pain         Complications: No apparent anesthesia complications

## 2018-12-25 NOTE — Discharge Instructions (Signed)

## 2018-12-25 NOTE — Anesthesia Postprocedure Evaluation (Signed)
Anesthesia Post Note  Patient: [name]  Procedure(s) Performed: XI ROBOTIC ASSISTED LAPAROSCOPIC NEPHRECTOMY, LEFT FLANK EXPLORATION WITH REMOVAL OF BATTERY PACK (Left )     Patient location during evaluation: PACU Anesthesia Type: General Level of consciousness: sedated Pain management: pain level controlled Vital Signs Assessment: post-procedure vital signs reviewed and stable Respiratory status: spontaneous breathing and respiratory function stable Cardiovascular status: stable Postop Assessment: no apparent nausea or vomiting Anesthetic complications: no    Last Vitals:  Vitals:   12/25/18 1339 12/25/18 1342  BP: (!) 164/92 (!) 158/85  Pulse:  75  Resp: 14 14  Temp: (!) 36.3 C (!) 36.3 C  SpO2: 98% 99%                 Fredrich Cory DANIEL

## 2018-12-25 NOTE — Op Note (Addendum)
Preoperative diagnosis:  1. Left ureteral stricture with poorly functioning left kidney   Postoperative diagnosis:  1. same   Procedure: 1. Robotic assisted laparoscopic left simple nephrectomy 2. Nephrostomy tube removal 3. Tap block 4. Removal of InterStim battery pack  Surgeon(s):  Glennis Montenegro W. Chabeli Barsamian, MD Case Wood, MD (resident)  1st assistant:  Amanda Dancey, PA  Anesthesia: General  Complications: None  Intraoperative findings:  #1. Successful left nephrectomy; 1 artery, 1 vein #2. InterStim battery pack successfully removed with excision of surrounding capsule  EBL: 100 mL  Specimens: left kidney  Indication: [name] is a 73 y.o. patient with left ureteral stricture refractory to endoscopic management, poorly functioning left kidney and recurrent UTI.  After reviewing the management options for treatment, she elected to proceed with the above surgical procedure(s). We have discussed the potential benefits and risks of the procedure, side effects of the proposed treatment, the likelihood of the patient achieving the goals of the procedure, and any potential problems that might occur during the procedure or recuperation. Informed consent has been obtained.  Description of procedure:  The patient was taken to the operating room and a general anesthetic was administered. The patient was given preoperative antibiotics, placed in the right modified flank position with care to pad all potential pressure points, and prepped and draped in the usual sterile fashion. Next a preoperative timeout was performed.  A stab incision was made in the left upper quadrant and and the abdomen was insufflated in standard Veress technique. A site was selected on the left side of the umbilicus for placement of the camera port. This was placed into the peritoneum with a 8 mm tocar. We entered the peritoneum without incident.  The camera was then used to inspect the abdomen and there was no  evidence of any intra-abdominal injuries or other abnormalities. The remaining abdominal ports were then placed. 8 mm robotic ports were placed in the left upper quadrant, left lower quadrant, and left lateral abdominal wall, making a soft J. A 12 mm port was placed in the upper midline for laparoscopic assistance. All ports were placed under direct vision without difficulty. The surgical cart was then docked.   Utilizing the cautery scissors, the white line of Toldt was incised allowing the colon to be mobilized medially and the plane between the mesocolon and the anterior layer of Gerota's fascia to be developed and the kidney to be exposed.  The mesocolonic fat was significantly adherend to Gerota's fascia at the level of the lower pole. The ureter was identified inferiorly and the ureter was lifted anteriorly off the psoas muscle. Dissection proceeded superiorly until the renal vein was identified.  The renal hilum was then carefully isolated with a combination of blunt and sharp dissection allowing the renal arterial and venous structures to be separated. The renal vein and artery were stapled separately using a vascular loads without complication. The gonadal and adrenal veins were successfully spared.  The dissection was carried anteriorly, successfully sparing the adrenal gland. Attention was then turned to the ureter, which was clipped with a Weck clip and then cut proximally. The kidney was freed from its superolateral attachments. The nephrostomy tube was clipped, cut distally and then removed externally. The resection site was examined. Hemostasis appeared adequate. A hemostatic agent was applied.  The specimen was placed into a specimen bag. The surgical robotic cart was undocked. The [facility] was closed at the fascial level with a 0-vicryl suture under direct visualization. A  tap block was administered along the left lower quadrant. All other laparoscopic/robotic ports were removed under  direct vision and the pneumoperitoneum let down with inspection of the operative field performed and hemostasis again confirmed. The kidney was removed within an endopouch retrieval bag via extension of the left lower quadrant and lateral port incisions. The posterior fascial layer was closed with a running 0-vicryl suture. The anterior fascial layer was closed with a running 0-looped PDS suture. 3-0 Vicryl suture was used to close scarpa's fascia.  Attention was then turned to the InterStim battery pack, located in the left lower back. A 5cm incision was made along the prior scar. The InterStim was removed after the leads were cut as low as possible. The surrounding capsule was then excised using electrocautery dissection. Hemostasis was excellent. The subcuticular layer was closed with a running 3-0 Vicryl suture.  All incision sites were then injected with local anesthetic and reapproximated at the skin level with 4-0 monocryl subcuticular closures. Dermabond was applied to the skin.  The patient tolerated the procedure well and without complications.  The patient was able to be extubated and transferred to the recovery unit in satisfactory condition.

## 2018-12-25 NOTE — H&P (Signed)
The patient is seen today in follow-up. She initially presented with a septic stone in her left proximal ureter. The nephrostomy tube was placed and she was then taken to the operating room several weeks later after infection is cleared for further evaluation. This proved to be very difficult as the ureter was torturous and quite fixed. I was unable to get beyond the stricture into the kidney. The stone was fragmented, but no stent was left, a nephrostomy tube was left with plans to return for PCNL.   The patient has had low-grade fever, but was not above 101.5. Over the past 48 hours she has not had any fevers. She was discharged home on ciprofloxacin.   The patient has subsequently undergone 2 separate surgeries, the 1st 1 on December 13th. This was ureteroscopy. The the patient had a fixed in rigid ureteral narrowing with a full as curve in the proximal ureter. The stone was noted to be in the middle of the S curve. I was able to fragment the stone but unable to get a wire across the stricture. We then returned 10 days later and attempted antegrade treatment of the stone and the stricture. I was unsuccessful in obtaining access in the antegrade fashion either. I did replace the nephrostomy tube.   She then had a CT scan as well as a Mag 3 Lasix renogram. The Mag 3 renogram demonstrates that the patient has 20% function in the left kidney. It also demonstrates complete obstruction. The CT scan shows resolution of the kidney stone, but unfortunately the nephrostomy tube is noted to be outside the collecting system.   The neph tube was removed and the patient was placed on antibiotics. This in attempt to avoid nephrectomy.   Intv: The patient presents today for further evaluation of her obstructed left kidney. She is currently on Bactrim her urine culture that grew her side. She is feeling better. She continues to have some weakness. She had a stress test in the hospital, the results are still pending. She  is eager to proceed with nephrectomy. We can discuss removing the battery for her spinal cord stimulator. I discussed this with Dr. Noel Gerold her neurosurgeon who agreed that removing it was a good idea at the time of her pending surgery. She is not having any ongoing fevers or chills. She continues to feel some soreness in her left kidney, otherwise she is doing well.     ALLERGIES: No Allergies    MEDICATIONS: Adderall 20 mg tablet  Amitriptyline Hcl  Bactrim  Effexor Xr 75 mg capsule, ext release 24 hr  Oxycodone Hcl 15 mg tablet     GU PSH: Cysto Uretero Lithotripsy, Left - 09/27/2018 Cysto/uretero W/up Stricture - 09/27/2018 Endoscopy via Nephrost Tube - 10/20/2018 Hysterectomy, 1990 Locm 300-399Mg /Ml Iodine,1Ml - 11/02/2018 Nephrostomy Tube Change - 10/20/2018    NON-GU PSH: Gastric bypass, 2005    GU PMH: Ureteral stricture - 10/03/2018 Ureteral calculus - 09/12/2018    NON-GU PMH: Anxiety Arthritis    FAMILY HISTORY: 1 Daughter - Daughter 1 son - Son Heart Attack - Mother   SOCIAL HISTORY: Marital Status: Married Preferred Language: English; Ethnicity: Not Hispanic Or Latino; Race: White Current Smoking Status: Patient has never smoked.   Tobacco Use Assessment Completed: Used Tobacco in last 30 days? Has never drank.  Drinks 4+ caffeinated drinks per day. Patient's occupation is/was Retired.    REVIEW OF SYSTEMS:    GU Review Female:   Patient denies frequent urination, hard to postpone  urination, burning /pain with urination, get up at night to urinate, leakage of urine, stream starts and stops, trouble starting your stream, have to strain to urinate, and being pregnant.  Gastrointestinal (Upper):   Patient denies nausea, vomiting, and indigestion/ heartburn.  Gastrointestinal (Lower):   Patient reports constipation. Patient denies diarrhea.  Constitutional:   Patient reports fatigue. Patient denies fever, night sweats, and weight loss.  Skin:   Patient denies  skin rash/ lesion and itching.  Eyes:   Patient denies blurred vision and double vision.  Ears/ Nose/ Throat:   Patient denies sore throat and sinus problems.  Hematologic/Lymphatic:   Patient denies swollen glands and easy bruising.  Cardiovascular:   Patient denies leg swelling and chest pains.  Respiratory:   Patient denies cough and shortness of breath.  Endocrine:   Patient denies excessive thirst.  Musculoskeletal:   Patient denies joint pain and back pain.  Neurological:   Patient denies headaches and dizziness.  Psychologic:   Patient denies depression and anxiety.   VITAL SIGNS:      12/15/2018 11:16 AM  Weight 126 lb / 57.15 kg  BP 119/76 mmHg  Pulse 93 /min  Temperature 97.7 F / 36.5 C   GU PHYSICAL EXAMINATION:      Notes: The left flank has a nephrostomy tube emanating from it draining blood-tinged urine. The insertion site is clean. There is no evidence of infection.   MULTI-SYSTEM PHYSICAL EXAMINATION:    Constitutional: Well-nourished. No physical deformities. Normally developed. Good grooming.  Respiratory: Normal breath sounds. No labored breathing, no use of accessory muscles.   Cardiovascular: Regular rate and rhythm. No murmur, no gallop. Normal temperature, normal extremity pulses, no swelling, no varicosities.      PAST DATA REVIEWED:  Source Of History:  Patient  Records Review:   Previous Doctor Records, Previous Hospital Records, Previous Patient Records, POC Tool   PROCEDURES: None   ASSESSMENT:      ICD-10 Details  1 GU:   Ureteral stricture - N13.5    PLAN:           Schedule Return Visit/Planned Activity: ASAP - Schedule Surgery          Document Letter(s):  Created for Patient: Clinical Summary         Notes:   The patient has an infected and nonfunctioning left kidney. She needs a left simple nephrectomy. Until that time she will continue with the nephrostomy tube as well as the Bactrim antibiotics. She does have a cardiology appointment  pending, she has undergone a stress test with the results pending. I would plan to perform a robotic assisted laparoscopic nephrectomy if able. She does have a higher than average risk of conversion given the amount of infection and scar tissue that she has in this area. We will also plan to remove the nephrostomy tube at the time surgery. In addition, we have discussed removing the battery from her spinal cord stimulator and I discussed this with Dr. Noel Gerold who agrees with this plan. And finally, I told the patient that we would also explore the organized hematoma that she has developed around the battery and nephrostomy tube in the left flank which seems to be unchanged an creating some mild discomfort. I went through all of this with her in significant detail, and she is anxious to proceed. We will need to wait on her cardiac clearance, but will plan to get her scheduled as soon as we are able to.

## 2018-12-26 ENCOUNTER — Encounter (HOSPITAL_COMMUNITY): Payer: Self-pay | Admitting: Urology

## 2018-12-26 LAB — HEMOGLOBIN AND HEMATOCRIT, BLOOD
HCT: 32.4 % — ABNORMAL LOW (ref 36.0–46.0)
Hemoglobin: 9.8 g/dL — ABNORMAL LOW (ref 12.0–15.0)

## 2018-12-26 LAB — BASIC METABOLIC PANEL
ANION GAP: 7 (ref 5–15)
BUN: 15 mg/dL (ref 8–23)
CO2: 26 mmol/L (ref 22–32)
Calcium: 8.9 mg/dL (ref 8.9–10.3)
Chloride: 104 mmol/L (ref 98–111)
Creatinine, Ser: 1.16 mg/dL — ABNORMAL HIGH (ref 0.44–1.00)
GFR calc Af Amer: 55 mL/min — ABNORMAL LOW (ref >60)
GFR calc non Af Amer: 48 mL/min — ABNORMAL LOW (ref >60)
Glucose, Bld: 72 mg/dL (ref 70–99)
Potassium: 4 mmol/L (ref 3.5–5.1)
Sodium: 137 mmol/L (ref 135–145)

## 2018-12-26 MED ORDER — NITROFURANTOIN MONOHYD MACRO 100 MG PO CAPS
100.0000 mg | ORAL_CAPSULE | Freq: Two times a day (BID) | ORAL | Status: DC
  Administered 2018-12-26 – 2018-12-28 (×5): 100 mg via ORAL
  Filled 2018-12-26 (×5): qty 1

## 2018-12-26 NOTE — Progress Notes (Signed)
Urology Progress Note   1 Day Post-Op  Subjective: NAEON. AFVSS. UOP adequate (clear). Labs stable. Tolerating clears. No flatus.  Objective: Vital signs in last 24 hours: Temp:  [97.3 F (36.3 C)-98.3 F (36.8 C)] 98.2 F (36.8 C) (02/18 0412) Pulse Rate:  [68-84] 74 (02/18 0412) Resp:  [12-22] 14 (02/18 0412) BP: (120-164)/(76-92) 120/78 (02/18 0412) SpO2:  [92 %-100 %] 92 % (02/18 0412) Weight:  [57.3 kg] 57.3 kg (02/17 1749)  Intake/Output from previous day: 02/17 0701 - 02/18 0700 In: 3130.7 [P.O.:360; I.V.:2470.7; IV Piggyback:300] Out: 1405 [Urine:1255; Blood:150] Intake/Output this shift: No intake/output data recorded.  Physical Exam:  General: Alert and oriented CV: RRR Lungs: Clear Abdomen: Soft, appropriately tender. Incisions c/d/i Back: Incision c/d/i GU: Foley in place draining clear yellow urine Ext: NT, No erythema  Lab Results: Recent Labs    12/25/18 1140 12/26/18 0537  HGB 10.0* 9.8*  HCT 33.0* 32.4*   BMET Recent Labs    12/26/18 0537  NA 137  K 4.0  CL 104  CO2 26  GLUCOSE 72  BUN 15  CREATININE 1.16*  CALCIUM 8.9     Studies/Results: No results found.  Assessment/Plan:  73 y.o. female s/p robotic left nephrectomy; InterStim battery pack removal on 2/17.  Overall doing well post-op.   - Continue pain control with PRNs - [place], regular diet - Discontinue Foley catheter - OOB, SCDs, IS - Possible discharge home later today versus tomorrow   LOS: 1 day   Case Geri [place] 12/26/2018, 7:27 AM

## 2018-12-27 MED ORDER — ACETAMINOPHEN 10 MG/ML IV SOLN
1000.0000 mg | Freq: Four times a day (QID) | INTRAVENOUS | Status: AC
  Administered 2018-12-27 – 2018-12-28 (×4): 1000 mg via INTRAVENOUS
  Filled 2018-12-27 (×4): qty 100

## 2018-12-27 MED ORDER — OXYCODONE HCL 5 MG PO TABS
15.0000 mg | ORAL_TABLET | ORAL | Status: DC | PRN
  Administered 2018-12-27: 15 mg via ORAL
  Administered 2018-12-27: 20 mg via ORAL
  Administered 2018-12-27: 30 mg via ORAL
  Administered 2018-12-27: 25 mg via ORAL
  Filled 2018-12-27: qty 5
  Filled 2018-12-27: qty 3
  Filled 2018-12-27: qty 6
  Filled 2018-12-27: qty 4

## 2018-12-27 MED ORDER — OXYCODONE HCL 5 MG PO TABS: 15.0000 mg | ORAL_TABLET | ORAL | Status: DC | PRN

## 2018-12-27 NOTE — Progress Notes (Signed)
Urology Progress Note   2 Days Post-Op  Subjective: No issues overnight Pain has not been well controlled No nausea Tolerating regular diet Voiding without catheter  Objective: Vital signs in last 24 hours: Temp:  [98 F (36.7 C)-98.7 F (37.1 C)] 98.4 F (36.9 C) (02/19 1346) Pulse Rate:  [77-93] 93 (02/19 1346) Resp:  [16-19] 19 (02/19 1346) BP: (105-153)/(65-89) 105/65 (02/19 1346) SpO2:  [90 %-94 %] 92 % (02/19 1346)  Intake/Output from previous day: 02/18 0701 - 02/19 0700 In: 240 [P.O.:240] Out: 1300 [Urine:1300] Intake/Output this shift: Total I/O In: 480 [P.O.:480] Out: 850 [Urine:850]  Physical Exam:  General: Alert and oriented CV: RRR Lungs: Clear Abdomen: Soft, appropriately tender. Incisions c/d/i Back: Incision c/d/i Ext: NT, No erythema  Lab Results: Recent Labs    12/25/18 1140 12/26/18 0537  HGB 10.0* 9.8*  HCT 33.0* 32.4*   BMET Recent Labs    12/26/18 0537  NA 137  K 4.0  CL 104  CO2 26  GLUCOSE 72  BUN 15  CREATININE 1.16*  CALCIUM 8.9     Studies/Results: No results found.  Assessment/Plan:  73 y.o. female s/p robotic left nephrectomy; InterStim battery pack removal on 2/17.  Overall doing well post-op.   Work on oral pain regimen Encourage ambulation Plan for discharge tomorrow.   LOS: 2 days   Crist Fat 12/27/2018, 2:39 PM

## 2018-12-28 NOTE — Discharge Summary (Signed)
[facility] Discharge Summary  Admit date: 12/25/2018  Discharge date and time: 12/28/18   Discharge to: Home  Discharge Service: Urology  Discharge Attending Physician:  Berniece Salines, MD  Discharge  Diagnoses: <principal problem not specified>  Secondary Diagnosis: Active Problems:   Nonfunctioning kidney   OR Procedures: Procedure(s): XI ROBOTIC ASSISTED LAPAROSCOPIC NEPHRECTOMY, LEFT FLANK EXPLORATION WITH REMOVAL OF BATTERY PACK 12/25/2018   Ancillary Procedures: None   Discharge Day Services: The patient was seen and examined by the Urology team both in the morning and immediately prior to discharge.  Vital signs and laboratory values were stable and within normal limits.  The physical exam was benign and unchanged and all surgical wounds were examined.  Discharge instructions were explained and all questions answered.  Subjective  No acute events overnight. Pain Controlled. No fever or chills.  Objective Patient Vitals for the past 8 hrs:  BP Temp Temp src Pulse Resp SpO2  12/28/18 0552 (!) 135/92 97.7 F (36.5 C) Oral 75 14 94 %   No intake/output data recorded.  General Appearance:        No acute distress Lungs:                       Normal work of breathing on room air Heart:                                Regular rate and rhythm Abdomen:                         Soft, non-tender, non-distended, incisions c/d/i Extremities:                      Warm and well perfused   Hospital Course:  The patient underwent robotic left nephrectomy as well as removal of InterStim battery pack on 12/25/2018.  The patient tolerated the procedure well, was extubated in the OR, and afterwards was taken to the PACU for routine post-surgical care. When stable the patient was transferred to the floor.   The patient did well postoperatively.  The patient's diet was slowly advanced and at the time of discharge was tolerating a regular diet.  The patient was discharged home 3 Days  Post-Op, at which point was tolerating a regular solid diet, was able to void spontaneously, have adequate pain control with P.O. pain medication, and could ambulate without difficulty. The patient will follow up with [place] for post op check.   Condition at Discharge: Improved  Discharge Medications:  Allergies as of 12/28/2018      Reactions   Other Itching   Adhesive tape      Medication List    STOP taking these medications   sulfamethoxazole-trimethoprim 800-160 MG tablet Commonly known as:  BACTRIM DS,SEPTRA DS     TAKE these medications   amitriptyline 75 MG tablet Commonly known as:  ELAVIL Take 75 mg by mouth at bedtime.   amphetamine-dextroamphetamine 20 MG 24 hr capsule Commonly known as:  ADDERALL XR TAKE TWO CAPSULE BY MOUTH EVERY MORNING   clonazePAM 0.5 MG tablet Commonly known as:  KLONOPIN Take 0.5 tablets (0.25 mg total) by mouth daily as needed for anxiety. What changed:    how much to take  additional instructions   furosemide 40 MG tablet Commonly known as:  LASIX Take 1 tablet (40 mg total) by mouth daily as  needed for fluid or edema. Start taking on:  January 02, 2019   neomycin-bacitracin-polymyxin ointment Commonly known as:  NEOSPORIN Apply 1 application topically daily as needed for wound care.   nitrofurantoin (macrocrystal-monohydrate) 100 MG capsule Commonly known as:  MACROBID Take 1 capsule (100 mg total) by mouth 2 (two) times daily for 5 days.   oxyCODONE 15 MG immediate release tablet Commonly known as:  ROXICODONE Take 1 tablet (15 mg total) by mouth 3 (three) times daily.   SUMAtriptan 50 MG tablet Commonly known as:  IMITREX TAKE 1 TABLET AT ONSET OF HEADACHE. MAY REPEAT IN 2 HOURS IF NEEDED What changed:  See the new instructions.   venlafaxine XR 75 MG 24 hr capsule Commonly known as:  EFFEXOR-XR Take 2 capsules (150 mg total) by mouth daily.

## 2018-12-28 NOTE — Progress Notes (Signed)
Pt to be discharged to home today. Discharge instructions including Medications and schedules reviewed with Pt. Pt verbalizes understanding of all discharge instructions

## 2019-01-05 ENCOUNTER — Other Ambulatory Visit: Payer: Self-pay | Admitting: Family Medicine

## 2019-01-05 DIAGNOSIS — F988 Other specified behavioral and emotional disorders with onset usually occurring in childhood and adolescence: Secondary | ICD-10-CM

## 2019-01-05 MED ORDER — AMPHETAMINE-DEXTROAMPHET ER 20 MG PO CP24: ORAL_CAPSULE | ORAL | 0 refills | Status: DC

## 2019-01-05 MED ORDER — CLONAZEPAM 0.5 MG PO TABS: 0.1250 mg | ORAL_TABLET | Freq: Every day | ORAL | 1 refills | Status: DC | PRN

## 2019-01-05 NOTE — Telephone Encounter (Signed)
Copied from CRM [id number]. Topic: Quick Communication - Rx Refill/Question >> Jan 05, 2019  8:01 AM Lynne Logan D wrote: Medication: amphetamine-dextroamphetamine (ADDERALL XR) 20 MG 24 hr capsule / clonazePAM (KLONOPIN) 0.5 MG tablet  Has the patient contacted their pharmacy? No. Controlled Substance (Agent: If no, request that the patient contact the pharmacy for the refill.) (Agent: If yes, when and what did the pharmacy advise?)  Preferred Pharmacy (with phone number or street name): [facility] - [place],  - [address] [phone] (Phone) [phone] (Fax)    Agent: Please be advised that RX refills may take up to 3 business days. We ask that you follow-up with your pharmacy.

## 2019-01-05 NOTE — Telephone Encounter (Signed)
Requesting:adderall & klonopin Contract:yes UDS:low risk next screen 04/25/19 Last OV:12/21/18 Next OV:03/22/19 Last Refill: Database:   Please advise

## 2019-01-12 ENCOUNTER — Other Ambulatory Visit: Payer: Self-pay | Admitting: Family Medicine

## 2019-01-12 DIAGNOSIS — R519 Headache, unspecified: Secondary | ICD-10-CM

## 2019-01-12 DIAGNOSIS — R51 Headache: Principal | ICD-10-CM

## 2019-02-21 ENCOUNTER — Other Ambulatory Visit: Payer: Self-pay | Admitting: Family Medicine

## 2019-02-21 DIAGNOSIS — F988 Other specified behavioral and emotional disorders with onset usually occurring in childhood and adolescence: Secondary | ICD-10-CM

## 2019-02-23 NOTE — Telephone Encounter (Signed)
Last written: 01/05/19 Last ov: 12/21/18 Next ov: 03/22/19 Contract: 10/25/19 UDS: 04/25/19

## 2019-03-02 ENCOUNTER — Other Ambulatory Visit: Payer: Self-pay | Admitting: Family Medicine

## 2019-03-02 DIAGNOSIS — R51 Headache: Principal | ICD-10-CM

## 2019-03-02 DIAGNOSIS — R519 Headache, unspecified: Secondary | ICD-10-CM

## 2019-03-22 ENCOUNTER — Other Ambulatory Visit: Payer: Self-pay

## 2019-03-22 ENCOUNTER — Ambulatory Visit (INDEPENDENT_AMBULATORY_CARE_PROVIDER_SITE_OTHER): Payer: Medicare Other | Admitting: Family Medicine

## 2019-03-22 ENCOUNTER — Encounter: Payer: Self-pay | Admitting: Family Medicine

## 2019-03-22 DIAGNOSIS — S7002XA Contusion of left hip, initial encounter: Secondary | ICD-10-CM | POA: Diagnosis not present

## 2019-03-22 DIAGNOSIS — R51 Headache: Secondary | ICD-10-CM

## 2019-03-22 DIAGNOSIS — F419 Anxiety disorder, unspecified: Secondary | ICD-10-CM | POA: Diagnosis not present

## 2019-03-22 DIAGNOSIS — T148XXA Other injury of unspecified body region, initial encounter: Secondary | ICD-10-CM

## 2019-03-22 DIAGNOSIS — R519 Headache, unspecified: Secondary | ICD-10-CM

## 2019-03-22 MED ORDER — CLONAZEPAM 0.5 MG PO TABS: ORAL_TABLET | ORAL | 1 refills | Status: DC

## 2019-03-22 NOTE — Progress Notes (Signed)
Virtual Visit via Video Note  I connected with [name] on 03/22/19 at  4:15 PM EDT by a video enabled telemedicine application and verified that I am speaking with the correct person using two identifiers.  Location: Patient: home with her husband [name] Provider: home    I discussed the limitations of evaluation and management by telemedicine and the availability of in person appointments. The patient expressed understanding and agreed to proceed.  History of Present Illness: Pt is home with her husband ---  She needs refills of her meds She also c/o worsening headaches since her kidney surgry.  She goes through #10 immitrex q10 days.  It goes away within 5 min of taking the imitrex but come s back the next day   Observations/Objective:. .afebrile-- no other vitals obtained  Pt is in nad rr normal  Past Medical History:  Diagnosis Date  . Abrasion of right heel    during admission 10/ 2019 right heel open skin due to movement on sheet  . ADD (attention deficit disorder)   . Anemia, mild   . Anxiety   . Arthritis    "joints, back"  . At high risk for falls    10-19-2018 pt has fell twice in past week, tripped  . Chronic back pain    "all over my back" (10/02/2013)  . Crushing injury of arm, right 02/06/1989's   "it was crushed; wore cast from fingers to top of my shoulder" (11/25/2  . Crushing injury of left wrist 05/11/1989's  . DOE (dyspnea on exertion)   . Essential tremor   . Fibromyalgia   . History of palpitations 2002   recurrent  . History of panic attacks   . History of pneumonia 08/21/2018   w/ acute respiratory failure/ hypoxia  . History of sepsis    admission 08-21-2018  secondary to uropathy obstructive from ureteral stone   . Hyperlipidemia   . Hypertension    10-19-2018 PER PT AMLODIPINE ON HOLD SINCE 10/ 2019 DUE TO KIDNEY ISSUES PER DOCTOR  . Idiopathic scoliosis 04/19/2013  . Left ureteral stone   . Migraines    "once/month" (10/02/2013)  .  Osteoporosis   . Other vitamin B12 deficiency anemia   . PONV (postoperative nausea and vomiting)   . Pulmonary nodules/lesions, multiple   . Renal insufficiency   . S/P gastric bypass 09/16/2003  . S/P insertion of spinal cord stimulator    per pt remote is missing  . Wears dentures    upper  . Wears glasses   . Wears glasses    Current Outpatient Medications on File Prior to Visit  Medication Sig Dispense Refill  . amitriptyline (ELAVIL) 75 MG tablet Take 75 mg by mouth at bedtime.    [name]. amphetamine-dextroamphetamine (ADDERALL XR) 20 MG 24 hr capsule TAKE TWO CAPSULE BY MOUTH EVERY MORNING 60 capsule 0  . furosemide (LASIX) 40 MG tablet Take 1 tablet (40 mg total) by mouth daily as needed for fluid or edema. 30 tablet 0  . neomycin-bacitracin-polymyxin (NEOSPORIN) ointment Apply 1 application topically daily as needed for wound care.    [name]. oxyCODONE (ROXICODONE) 15 MG immediate release tablet Take 1 tablet (15 mg total) by mouth 3 (three) times daily. 10 tablet 0  . SUMAtriptan (IMITREX) 50 MG tablet TAKE 1 TABLET AT ONSET OF HEADACHE. MAY REPEAT IN 2 HOURS IF NEEDED 10 tablet 1  . venlafaxine XR (EFFEXOR-XR) 75 MG 24 hr capsule Take 2 capsules (150 mg total) by mouth  daily. 180 capsule 3   No current facility-administered medications on file prior to visit.      Assessment and Plan: 1. Anxiety Stable Refill med - clonazePAM (KLONOPIN) 0.5 MG tablet; 1/2 po bid prn  Dispense: 30 tablet; Refill: 1 - Comprehensive metabolic panel; Future  2. Nonintractable headache, unspecified chronicity pattern, unspecified headache type con't imitrex for now  - Ambulatory referral to Neurology - Comprehensive metabolic panel; Future - Sedimentation rate; Future  3. Bruising Bruise on L hip x 6 months con't warm compresses Consider hematology - CBC with Differential/Platelet; Future - Comprehensive metabolic panel; Future - PTT; Future - Protime-INR ( [facility] ONLY); Future   Follow  Up Instructions:    I discussed the assessment and treatment plan with the patient. The patient was provided an opportunity to ask questions and all were answered. The patient agreed with the plan and demonstrated an understanding of the instructions.   The patient was advised to call back or seek an in-person evaluation if the symptoms worsen or if the condition fails to improve as anticipated.  I provided 25 minutes of non-face-to-face time during this encounter.   Donato Schultz, DO

## 2019-04-02 ENCOUNTER — Other Ambulatory Visit: Payer: Self-pay | Admitting: Family Medicine

## 2019-04-02 DIAGNOSIS — F988 Other specified behavioral and emotional disorders with onset usually occurring in childhood and adolescence: Secondary | ICD-10-CM

## 2019-04-03 ENCOUNTER — Other Ambulatory Visit: Payer: Self-pay

## 2019-04-03 ENCOUNTER — Other Ambulatory Visit: Payer: Medicare Other

## 2019-04-03 ENCOUNTER — Other Ambulatory Visit (INDEPENDENT_AMBULATORY_CARE_PROVIDER_SITE_OTHER): Payer: Medicare Other

## 2019-04-03 DIAGNOSIS — T148XXA Other injury of unspecified body region, initial encounter: Secondary | ICD-10-CM

## 2019-04-03 DIAGNOSIS — R51 Headache: Secondary | ICD-10-CM | POA: Diagnosis not present

## 2019-04-03 DIAGNOSIS — R519 Headache, unspecified: Secondary | ICD-10-CM

## 2019-04-03 DIAGNOSIS — F419 Anxiety disorder, unspecified: Secondary | ICD-10-CM

## 2019-04-03 LAB — COMPREHENSIVE METABOLIC PANEL
ALT: 9 U/L (ref 0–35)
AST: 24 U/L (ref 0–37)
Albumin: 4.2 g/dL (ref 3.5–5.2)
Alkaline Phosphatase: 80 U/L (ref 39–117)
BUN: 24 mg/dL — ABNORMAL HIGH (ref 6–23)
CO2: 28 mEq/L (ref 19–32)
Calcium: 9.3 mg/dL (ref 8.4–10.5)
Chloride: 105 mEq/L (ref 96–112)
Creatinine, Ser: 1.5 mg/dL — ABNORMAL HIGH (ref 0.40–1.20)
GFR: 34.21 mL/min — ABNORMAL LOW (ref >60.00)
Glucose, Bld: 79 mg/dL (ref 70–99)
Potassium: 4.4 mEq/L (ref 3.5–5.1)
Sodium: 141 mEq/L (ref 135–145)
Total Bilirubin: 0.3 mg/dL (ref 0.2–1.2)
Total Protein: 6.9 g/dL (ref 6.0–8.3)

## 2019-04-03 LAB — CBC WITH DIFFERENTIAL/PLATELET
Basophils Absolute: 0 10*3/uL (ref 0.0–0.1)
Basophils Relative: 0.9 % (ref 0.0–3.0)
Eosinophils Absolute: 0.2 10*3/uL (ref 0.0–0.7)
Eosinophils Relative: 4.6 % (ref 0.0–5.0)
HCT: 36.7 % (ref 36.0–46.0)
Hemoglobin: 12.6 g/dL (ref 12.0–15.0)
Lymphocytes Relative: 60.4 % — ABNORMAL HIGH (ref 12.0–46.0)
Lymphs Abs: 2.8 10*3/uL (ref 0.7–4.0)
MCHC: 34.2 g/dL (ref 30.0–36.0)
MCV: 95.6 fl (ref 78.0–100.0)
Monocytes Absolute: 0.5 10*3/uL (ref 0.1–1.0)
Monocytes Relative: 11.9 % (ref 3.0–12.0)
Neutro Abs: 1 10*3/uL — ABNORMAL LOW (ref 1.4–7.7)
Neutrophils Relative %: 22.2 % — ABNORMAL LOW (ref 43.0–77.0)
Platelets: 168 10*3/uL (ref 150.0–400.0)
RBC: 3.84 Mil/uL — ABNORMAL LOW (ref 3.87–5.11)
RDW: 13.7 % (ref 11.5–15.5)
WBC: 4.6 10*3/uL (ref 4.0–10.5)

## 2019-04-03 LAB — SEDIMENTATION RATE: Sed Rate: 18 mm/hr (ref 0–30)

## 2019-04-03 LAB — PROTIME-INR
INR: 1 ratio (ref 0.8–1.0)
Prothrombin Time: 11.2 s (ref 9.6–13.1)

## 2019-04-03 LAB — APTT: aPTT: 26.9 s (ref 23.4–32.7)

## 2019-04-03 NOTE — Telephone Encounter (Signed)
Requesting: Adderall XR Contract: 02/07/2018 UDS:10/24/2018, low risk, next screening 04/25/2019 Last OV: 03/22/2019 Next OV: 09/25/2019 Last Refill: 02/23/2019, #60--0 RF Database:   Please advise

## 2019-04-12 ENCOUNTER — Other Ambulatory Visit: Payer: Self-pay | Admitting: Orthopedic Surgery

## 2019-04-12 ENCOUNTER — Other Ambulatory Visit: Payer: Self-pay

## 2019-04-12 ENCOUNTER — Ambulatory Visit
Admission: RE | Admit: 2019-04-12 | Discharge: 2019-04-12 | Disposition: A | Discharge status: Home / Self Care | Payer: Medicare Other | Source: Ambulatory Visit | Attending: Orthopedic Surgery | Admitting: Orthopedic Surgery

## 2019-04-12 DIAGNOSIS — M25522 Pain in left elbow: Secondary | ICD-10-CM

## 2019-04-16 ENCOUNTER — Other Ambulatory Visit: Payer: Self-pay

## 2019-04-16 ENCOUNTER — Encounter (HOSPITAL_COMMUNITY): Payer: Self-pay | Admitting: *Deleted

## 2019-04-16 ENCOUNTER — Other Ambulatory Visit (HOSPITAL_COMMUNITY)
Admission: RE | Admit: 2019-04-16 | Discharge: 2019-04-16 | Disposition: A | Discharge status: Home / Self Care | Payer: Medicare Other | Source: Ambulatory Visit | Attending: Orthopedic Surgery | Admitting: Orthopedic Surgery

## 2019-04-16 DIAGNOSIS — Z1159 Encounter for screening for other viral diseases: Secondary | ICD-10-CM | POA: Diagnosis present

## 2019-04-16 LAB — SARS CORONAVIRUS 2 BY RT PCR (HOSPITAL ORDER, PERFORMED IN ~~LOC~~ HOSPITAL LAB): SARS Coronavirus 2: NEGATIVE

## 2019-04-17 ENCOUNTER — Encounter (HOSPITAL_COMMUNITY)
Admission: RE | Disposition: A | Discharge status: Home / Self Care | Payer: Self-pay | Source: Home / Self Care | Attending: Orthopedic Surgery

## 2019-04-17 ENCOUNTER — Ambulatory Visit (HOSPITAL_COMMUNITY): Payer: Medicare Other | Admitting: Certified Registered Nurse Anesthetist

## 2019-04-17 ENCOUNTER — Observation Stay (HOSPITAL_COMMUNITY)
Admission: RE | Admit: 2019-04-17 | Discharge: 2019-04-20 | Disposition: A | Discharge status: Home / Self Care | Payer: Medicare Other | Attending: Orthopedic Surgery | Admitting: Orthopedic Surgery

## 2019-04-17 ENCOUNTER — Other Ambulatory Visit: Payer: Self-pay

## 2019-04-17 ENCOUNTER — Encounter (HOSPITAL_COMMUNITY): Payer: Self-pay | Admitting: Certified Registered Nurse Anesthetist

## 2019-04-17 DIAGNOSIS — Z79899 Other long term (current) drug therapy: Secondary | ICD-10-CM | POA: Insufficient documentation

## 2019-04-17 DIAGNOSIS — M1990 Unspecified osteoarthritis, unspecified site: Secondary | ICD-10-CM | POA: Diagnosis not present

## 2019-04-17 DIAGNOSIS — G250 Essential tremor: Secondary | ICD-10-CM | POA: Insufficient documentation

## 2019-04-17 DIAGNOSIS — G43909 Migraine, unspecified, not intractable, without status migrainosus: Secondary | ICD-10-CM | POA: Insufficient documentation

## 2019-04-17 DIAGNOSIS — Z9181 History of falling: Secondary | ICD-10-CM | POA: Diagnosis not present

## 2019-04-17 DIAGNOSIS — Z9884 Bariatric surgery status: Secondary | ICD-10-CM | POA: Insufficient documentation

## 2019-04-17 DIAGNOSIS — M797 Fibromyalgia: Secondary | ICD-10-CM | POA: Insufficient documentation

## 2019-04-17 DIAGNOSIS — Z888 Allergy status to other drugs, medicaments and biological substances status: Secondary | ICD-10-CM | POA: Insufficient documentation

## 2019-04-17 DIAGNOSIS — I10 Essential (primary) hypertension: Secondary | ICD-10-CM | POA: Insufficient documentation

## 2019-04-17 DIAGNOSIS — S42422A Displaced comminuted supracondylar fracture without intercondylar fracture of left humerus, initial encounter for closed fracture: Principal | ICD-10-CM | POA: Diagnosis present

## 2019-04-17 DIAGNOSIS — G8929 Other chronic pain: Secondary | ICD-10-CM | POA: Diagnosis not present

## 2019-04-17 DIAGNOSIS — W19XXXA Unspecified fall, initial encounter: Secondary | ICD-10-CM | POA: Insufficient documentation

## 2019-04-17 DIAGNOSIS — F419 Anxiety disorder, unspecified: Secondary | ICD-10-CM | POA: Insufficient documentation

## 2019-04-17 HISTORY — PX: HUMERUS FRACTURE SURGERY: SHX670

## 2019-04-17 HISTORY — PX: ORIF HUMERUS FRACTURE: SHX2126

## 2019-04-17 SURGERY — OPEN REDUCTION INTERNAL FIXATION (ORIF) PROXIMAL HUMERUS FRACTURE
Anesthesia: Regional | Laterality: Left

## 2019-04-17 MED ORDER — METHOCARBAMOL 500 MG PO TABS
500.0000 mg | ORAL_TABLET | Freq: Four times a day (QID) | ORAL | Status: DC | PRN
  Administered 2019-04-18 (×2): 500 mg via ORAL
  Filled 2019-04-17 (×2): qty 1

## 2019-04-17 MED ORDER — ACETAMINOPHEN 10 MG/ML IV SOLN
INTRAVENOUS | Status: DC | PRN
  Administered 2019-04-17: 1000 mg via INTRAVENOUS

## 2019-04-17 MED ORDER — OXYCODONE HCL 5 MG PO TABS
ORAL_TABLET | ORAL | Status: AC
  Administered 2019-04-17: 15 mg via ORAL
  Filled 2019-04-17: qty 3

## 2019-04-17 MED ORDER — LACTATED RINGERS IV SOLN
INTRAVENOUS | Status: DC
  Administered 2019-04-17: 20:00:00 via INTRAVENOUS

## 2019-04-17 MED ORDER — FUROSEMIDE 40 MG PO TABS: 40.0000 mg | ORAL_TABLET | Freq: Every day | ORAL | Status: DC | PRN

## 2019-04-17 MED ORDER — VANCOMYCIN HCL IN DEXTROSE 1-5 GM/200ML-% IV SOLN
1000.0000 mg | INTRAVENOUS | Status: AC
  Administered 2019-04-18: 1000 mg via INTRAVENOUS
  Filled 2019-04-17: qty 200

## 2019-04-17 MED ORDER — PROMETHAZINE HCL 12.5 MG RE SUPP
12.5000 mg | Freq: Four times a day (QID) | RECTAL | Status: DC | PRN
  Filled 2019-04-17: qty 1

## 2019-04-17 MED ORDER — OXYCODONE HCL 5 MG/5ML PO SOLN: 5.0000 mg | Freq: Once | ORAL | Status: DC | PRN

## 2019-04-17 MED ORDER — OXYCODONE HCL 5 MG PO TABS: 15.0000 mg | ORAL_TABLET | Freq: Three times a day (TID) | ORAL | Status: DC

## 2019-04-17 MED ORDER — ONDANSETRON HCL 4 MG/2ML IJ SOLN
INTRAMUSCULAR | Status: AC
  Filled 2019-04-17: qty 2

## 2019-04-17 MED ORDER — MIDAZOLAM HCL 2 MG/2ML IJ SOLN
INTRAMUSCULAR | Status: AC
  Filled 2019-04-17: qty 2

## 2019-04-17 MED ORDER — ACETAMINOPHEN 325 MG PO TABS: 650.0000 mg | ORAL_TABLET | Freq: Four times a day (QID) | ORAL | Status: DC | PRN

## 2019-04-17 MED ORDER — OXYCODONE HCL 5 MG PO TABS
15.0000 mg | ORAL_TABLET | Freq: Once | ORAL | Status: AC
  Administered 2019-04-17: 15 mg via ORAL

## 2019-04-17 MED ORDER — VANCOMYCIN HCL IN DEXTROSE 1-5 GM/200ML-% IV SOLN
1000.0000 mg | INTRAVENOUS | Status: AC
  Administered 2019-04-17: 1000 mg via INTRAVENOUS
  Filled 2019-04-17: qty 200

## 2019-04-17 MED ORDER — ACETAMINOPHEN 10 MG/ML IV SOLN
INTRAVENOUS | Status: AC
  Filled 2019-04-17: qty 100

## 2019-04-17 MED ORDER — ASPIRIN 325 MG PO TABS
325.0000 mg | ORAL_TABLET | Freq: Two times a day (BID) | ORAL | Status: DC
  Administered 2019-04-17 – 2019-04-20 (×6): 325 mg via ORAL
  Filled 2019-04-17 (×6): qty 1

## 2019-04-17 MED ORDER — KETAMINE HCL 50 MG/5ML IJ SOSY
PREFILLED_SYRINGE | INTRAMUSCULAR | Status: AC
  Filled 2019-04-17: qty 5

## 2019-04-17 MED ORDER — ACETAMINOPHEN 325 MG PO TABS
325.0000 mg | ORAL_TABLET | Freq: Four times a day (QID) | ORAL | Status: DC | PRN
  Filled 2019-04-17: qty 2

## 2019-04-17 MED ORDER — 0.9 % SODIUM CHLORIDE (POUR BTL) OPTIME
TOPICAL | Status: DC | PRN
  Administered 2019-04-17: 1000 mL

## 2019-04-17 MED ORDER — FENTANYL CITRATE (PF) 100 MCG/2ML IJ SOLN
INTRAMUSCULAR | Status: AC
  Filled 2019-04-17: qty 2

## 2019-04-17 MED ORDER — ACETAMINOPHEN 500 MG PO TABS: 1000.0000 mg | ORAL_TABLET | Freq: Once | ORAL | Status: DC | PRN

## 2019-04-17 MED ORDER — SUCCINYLCHOLINE CHLORIDE 20 MG/ML IJ SOLN
INTRAMUSCULAR | Status: DC | PRN
  Administered 2019-04-17: 80 mg via INTRAVENOUS

## 2019-04-17 MED ORDER — HYDROMORPHONE HCL 1 MG/ML IJ SOLN
0.5000 mg | INTRAMUSCULAR | Status: DC | PRN
  Filled 2019-04-17: qty 1

## 2019-04-17 MED ORDER — ONDANSETRON HCL 4 MG/2ML IJ SOLN: 4.0000 mg | Freq: Four times a day (QID) | INTRAMUSCULAR | Status: DC | PRN

## 2019-04-17 MED ORDER — MIDAZOLAM HCL 2 MG/2ML IJ SOLN
INTRAMUSCULAR | Status: DC | PRN
  Administered 2019-04-17: 1 mg via INTRAVENOUS

## 2019-04-17 MED ORDER — ONDANSETRON HCL 4 MG PO TABS: 4.0000 mg | ORAL_TABLET | Freq: Four times a day (QID) | ORAL | Status: DC | PRN

## 2019-04-17 MED ORDER — METHOCARBAMOL 1000 MG/10ML IJ SOLN
500.0000 mg | Freq: Four times a day (QID) | INTRAVENOUS | Status: DC | PRN
  Filled 2019-04-17: qty 5

## 2019-04-17 MED ORDER — HYDRALAZINE HCL 20 MG/ML IJ SOLN
INTRAMUSCULAR | Status: DC | PRN
  Administered 2019-04-17 (×2): 5 mg via INTRAVENOUS

## 2019-04-17 MED ORDER — OXYCODONE HCL 5 MG PO TABS
10.0000 mg | ORAL_TABLET | ORAL | Status: DC | PRN
  Administered 2019-04-18: 10 mg via ORAL
  Administered 2019-04-18 (×2): 15 mg via ORAL
  Filled 2019-04-17 (×2): qty 3

## 2019-04-17 MED ORDER — ALPRAZOLAM 0.5 MG PO TABS: 0.5000 mg | ORAL_TABLET | Freq: Four times a day (QID) | ORAL | Status: DC | PRN

## 2019-04-17 MED ORDER — LIDOCAINE 2% (20 MG/ML) 5 ML SYRINGE
INTRAMUSCULAR | Status: DC | PRN
  Administered 2019-04-17: 60 mg via INTRAVENOUS

## 2019-04-17 MED ORDER — VITAMIN C 500 MG PO TABS
1000.0000 mg | ORAL_TABLET | Freq: Every day | ORAL | Status: DC
  Administered 2019-04-18 – 2019-04-20 (×3): 1000 mg via ORAL
  Filled 2019-04-17 (×3): qty 2

## 2019-04-17 MED ORDER — OXYCODONE HCL 5 MG PO TABS
5.0000 mg | ORAL_TABLET | ORAL | Status: DC | PRN
  Filled 2019-04-17: qty 2

## 2019-04-17 MED ORDER — BUPIVACAINE-EPINEPHRINE (PF) 0.5% -1:200000 IJ SOLN
INTRAMUSCULAR | Status: AC
  Filled 2019-04-17: qty 30

## 2019-04-17 MED ORDER — FENTANYL CITRATE (PF) 250 MCG/5ML IJ SOLN
INTRAMUSCULAR | Status: DC | PRN
  Administered 2019-04-17: 50 ug via INTRAVENOUS
  Administered 2019-04-17: 100 ug via INTRAVENOUS
  Administered 2019-04-17: 50 ug via INTRAVENOUS

## 2019-04-17 MED ORDER — CHLORHEXIDINE GLUCONATE 4 % EX LIQD: 60.0000 mL | Freq: Once | CUTANEOUS | Status: DC

## 2019-04-17 MED ORDER — FENTANYL CITRATE (PF) 100 MCG/2ML IJ SOLN
INTRAMUSCULAR | Status: AC
  Administered 2019-04-17: 50 ug via INTRAVENOUS
  Filled 2019-04-17: qty 2

## 2019-04-17 MED ORDER — ONDANSETRON HCL 4 MG/2ML IJ SOLN
INTRAMUSCULAR | Status: DC | PRN
  Administered 2019-04-17: 4 mg via INTRAVENOUS

## 2019-04-17 MED ORDER — ACETAMINOPHEN 10 MG/ML IV SOLN
1000.0000 mg | Freq: Once | INTRAVENOUS | Status: DC | PRN
  Administered 2019-04-17: 1000 mg via INTRAVENOUS

## 2019-04-17 MED ORDER — PROPOFOL 10 MG/ML IV BOLUS
INTRAVENOUS | Status: DC | PRN
  Administered 2019-04-17: 80 mg via INTRAVENOUS

## 2019-04-17 MED ORDER — SUGAMMADEX SODIUM 200 MG/2ML IV SOLN
INTRAVENOUS | Status: DC | PRN
  Administered 2019-04-17: 120 mg via INTRAVENOUS

## 2019-04-17 MED ORDER — DOCUSATE SODIUM 100 MG PO CAPS
100.0000 mg | ORAL_CAPSULE | Freq: Two times a day (BID) | ORAL | Status: DC
  Administered 2019-04-17 – 2019-04-20 (×6): 100 mg via ORAL
  Filled 2019-04-17 (×6): qty 1

## 2019-04-17 MED ORDER — SUMATRIPTAN SUCCINATE 50 MG PO TABS
50.0000 mg | ORAL_TABLET | ORAL | Status: DC | PRN
  Administered 2019-04-17 – 2019-04-18 (×2): 50 mg via ORAL
  Filled 2019-04-17 (×5): qty 1

## 2019-04-17 MED ORDER — LACTATED RINGERS IV SOLN
INTRAVENOUS | Status: DC | PRN
  Administered 2019-04-17: 12:00:00 via INTRAVENOUS

## 2019-04-17 MED ORDER — PHENYLEPHRINE 40 MCG/ML (10ML) SYRINGE FOR IV PUSH (FOR BLOOD PRESSURE SUPPORT)
PREFILLED_SYRINGE | INTRAVENOUS | Status: AC
  Filled 2019-04-17: qty 10

## 2019-04-17 MED ORDER — CELECOXIB 200 MG PO CAPS
200.0000 mg | ORAL_CAPSULE | Freq: Two times a day (BID) | ORAL | Status: DC
  Administered 2019-04-17 – 2019-04-20 (×6): 200 mg via ORAL
  Filled 2019-04-17 (×6): qty 1

## 2019-04-17 MED ORDER — VENLAFAXINE HCL ER 150 MG PO CP24
150.0000 mg | ORAL_CAPSULE | Freq: Every day | ORAL | Status: DC
  Administered 2019-04-18 – 2019-04-20 (×3): 150 mg via ORAL
  Filled 2019-04-17 (×3): qty 1

## 2019-04-17 MED ORDER — ROCURONIUM BROMIDE 10 MG/ML (PF) SYRINGE
PREFILLED_SYRINGE | INTRAVENOUS | Status: DC | PRN
  Administered 2019-04-17: 40 mg via INTRAVENOUS
  Administered 2019-04-17: 20 mg via INTRAVENOUS
  Administered 2019-04-17: 30 mg via INTRAVENOUS

## 2019-04-17 MED ORDER — ACETAMINOPHEN 160 MG/5ML PO SOLN: 1000.0000 mg | Freq: Once | ORAL | Status: DC | PRN

## 2019-04-17 MED ORDER — DEXAMETHASONE SODIUM PHOSPHATE 10 MG/ML IJ SOLN
INTRAMUSCULAR | Status: DC | PRN
  Administered 2019-04-17: 5 mg via INTRAVENOUS

## 2019-04-17 MED ORDER — AMITRIPTYLINE HCL 50 MG PO TABS
75.0000 mg | ORAL_TABLET | Freq: Every day | ORAL | Status: DC
  Administered 2019-04-17 – 2019-04-19 (×3): 75 mg via ORAL
  Filled 2019-04-17 (×3): qty 1

## 2019-04-17 MED ORDER — FENTANYL CITRATE (PF) 100 MCG/2ML IJ SOLN
25.0000 ug | INTRAMUSCULAR | Status: DC | PRN
  Administered 2019-04-17 (×2): 50 ug via INTRAVENOUS

## 2019-04-17 MED ORDER — PHENYLEPHRINE 40 MCG/ML (10ML) SYRINGE FOR IV PUSH (FOR BLOOD PRESSURE SUPPORT)
PREFILLED_SYRINGE | INTRAVENOUS | Status: DC | PRN
  Administered 2019-04-17 (×2): 80 ug via INTRAVENOUS

## 2019-04-17 MED ORDER — FENTANYL CITRATE (PF) 250 MCG/5ML IJ SOLN
INTRAMUSCULAR | Status: AC
  Filled 2019-04-17: qty 5

## 2019-04-17 MED ORDER — FAMOTIDINE 20 MG PO TABS
20.0000 mg | ORAL_TABLET | Freq: Two times a day (BID) | ORAL | Status: DC | PRN
  Filled 2019-04-17: qty 1

## 2019-04-17 MED ORDER — KETAMINE HCL 10 MG/ML IJ SOLN
INTRAMUSCULAR | Status: DC | PRN
  Administered 2019-04-17 (×2): 20 mg via INTRAVENOUS
  Administered 2019-04-17: 10 mg via INTRAVENOUS

## 2019-04-17 MED ORDER — OXYCODONE HCL 5 MG PO TABS: 5.0000 mg | ORAL_TABLET | Freq: Once | ORAL | Status: DC | PRN

## 2019-04-17 MED ORDER — LACTATED RINGERS IV SOLN
INTRAVENOUS | Status: DC
  Administered 2019-04-17: 12:00:00 via INTRAVENOUS

## 2019-04-17 SURGICAL SUPPLY — 83 items
BANDAGE ELASTIC 4 VELCRO ST LF (GAUZE/BANDAGES/DRESSINGS) ×3 IMPLANT
BIT DRILL 2.0 (BIT) ×1
BIT DRILL 2.0MM (BIT) ×1
BIT DRILL 2.5X2.75 QC CALB (BIT) ×3 IMPLANT
BIT DRILL 2XNS DISP SS SM FRAG (BIT) ×1 IMPLANT
BIT DRILL CALIBRATED 2.7 (BIT) ×2 IMPLANT
BIT DRILL CALIBRATED 2.7MM (BIT) ×1
BIT DRL 2XNS DISP SS SM FRAG (BIT) ×1
BLADE AVERAGE 25MMX9MM (BLADE) ×1
BLADE AVERAGE 25X9 (BLADE) ×2 IMPLANT
BNDG GAUZE ELAST 4 BULKY (GAUZE/BANDAGES/DRESSINGS) ×3 IMPLANT
CABLE CERLAGE W/NEEDLE CRIMP (Cable) ×3 IMPLANT
CLOSURE STERI-STRIP 1/4X4 (GAUZE/BANDAGES/DRESSINGS) ×3 IMPLANT
CLOSURE WOUND 1/2 X4 (GAUZE/BANDAGES/DRESSINGS) ×1
COVER WAND RF STERILE (DRAPES) ×3 IMPLANT
CUFF TOURN SGL QUICK 18X4 (TOURNIQUET CUFF) ×3 IMPLANT
DRAPE C-ARM 42X72 X-RAY (DRAPES) ×3 IMPLANT
DRAPE IMP U-DRAPE 54X76 (DRAPES) ×3 IMPLANT
DRAPE INCISE IOBAN 66X45 STRL (DRAPES) ×3 IMPLANT
DRAPE POUCH INSTRU U-SHP 10X18 (DRAPES) ×3 IMPLANT
DRAPE U-SHAPE 47X51 STRL (DRAPES) ×3 IMPLANT
DRSG ADAPTIC 3X8 NADH LF (GAUZE/BANDAGES/DRESSINGS) ×3 IMPLANT
DRSG EMULSION OIL 3X3 NADH (GAUZE/BANDAGES/DRESSINGS) ×3 IMPLANT
DRSG PAD ABDOMINAL 8X10 ST (GAUZE/BANDAGES/DRESSINGS) ×3 IMPLANT
ELECT REM PT RETURN 9FT ADLT (ELECTROSURGICAL) ×3
ELECTRODE REM PT RTRN 9FT ADLT (ELECTROSURGICAL) ×1 IMPLANT
GAUZE SPONGE 4X4 12PLY STRL (GAUZE/BANDAGES/DRESSINGS) ×3 IMPLANT
GLOVE BIO SURGEON STRL SZ7.5 (GLOVE) ×3 IMPLANT
GLOVE BIOGEL M 8.0 STRL (GLOVE) ×3 IMPLANT
GOWN STRL REUS W/ TWL LRG LVL3 (GOWN DISPOSABLE) ×2 IMPLANT
GOWN STRL REUS W/ TWL XL LVL3 (GOWN DISPOSABLE) ×2 IMPLANT
GOWN STRL REUS W/TWL LRG LVL3 (GOWN DISPOSABLE) ×4
GOWN STRL REUS W/TWL XL LVL3 (GOWN DISPOSABLE) ×4
K-WIRE ACE 1.6X6 (WIRE) ×15
KIT BASIN OR (CUSTOM PROCEDURE TRAY) ×3 IMPLANT
KIT TURNOVER KIT B (KITS) ×3 IMPLANT
KWIRE ACE 1.6X6 (WIRE) ×5 IMPLANT
MANIFOLD NEPTUNE II (INSTRUMENTS) ×3 IMPLANT
NDL SUT 6 .5 CRC .975X.05 MAYO (NEEDLE) ×1 IMPLANT
NEEDLE 22X1 1/2 (OR ONLY) (NEEDLE) IMPLANT
NEEDLE MAYO TAPER (NEEDLE) ×2
NS IRRIG 1000ML POUR BTL (IV SOLUTION) ×3 IMPLANT
PACK SHOULDER (CUSTOM PROCEDURE TRAY) ×3 IMPLANT
PACK UNIVERSAL I (CUSTOM PROCEDURE TRAY) ×3 IMPLANT
PAD ABD 8X10 STRL (GAUZE/BANDAGES/DRESSINGS) ×3 IMPLANT
PAD ARMBOARD 7.5X6 YLW CONV (MISCELLANEOUS) ×6 IMPLANT
PAD CAST 4YDX4 CTTN HI CHSV (CAST SUPPLIES) ×1 IMPLANT
PADDING CAST COTTON 4X4 STRL (CAST SUPPLIES) ×2
PASSER SUT SWANSON 36MM LOOP (INSTRUMENTS) ×9 IMPLANT
PIN GUIDE DRILL TIP 2.8X300 (DRILL) ×3 IMPLANT
PLATE DIST HUM MEDIAL LT SM (Plate) ×3 IMPLANT
PUTTY DBM STAGRAFT PLUS 10CC (Putty) ×3 IMPLANT
SCREW CANNULATED 6.5X95MM (Screw) ×3 IMPLANT
SCREW CORT T15 24X3.5XST LCK (Screw) ×1 IMPLANT
SCREW CORTICAL 3.5X24MM (Screw) ×2 IMPLANT
SCREW LOCK 3.5X56 DIST TIB (Screw) ×3 IMPLANT
SCREW LOCK 3.5X60 DIST TIB (Screw) ×3 IMPLANT
SCREW LOCK CORT STAR 3.5X10 (Screw) ×3 IMPLANT
SCREW LOCK CORT STAR 3.5X18 (Screw) ×6 IMPLANT
SCRUB BETADINE 4OZ XXX (MISCELLANEOUS) ×3 IMPLANT
SOL PREP POV-IOD 4OZ 10% (MISCELLANEOUS) ×3 IMPLANT
SPONGE LAP 4X18 RFD (DISPOSABLE) ×6 IMPLANT
STRIP CLOSURE SKIN 1/2X4 (GAUZE/BANDAGES/DRESSINGS) ×2 IMPLANT
SUCTION FRAZIER HANDLE 10FR (MISCELLANEOUS) ×2
SUCTION TUBE FRAZIER 10FR DISP (MISCELLANEOUS) ×1 IMPLANT
SUT BONE WAX W31G (SUTURE) IMPLANT
SUT ETHIBOND NAB CT1 #1 30IN (SUTURE) IMPLANT
SUT FIBERWIRE #2 38 REV NDL BL (SUTURE)
SUT FIBERWIRE 2-0 18 17.9 3/8 (SUTURE) ×12
SUT MNCRL AB 3-0 PS2 18 (SUTURE) IMPLANT
SUT VIC AB 0 CT1 36 (SUTURE) ×3 IMPLANT
SUT VIC AB 0 CTB1 27 (SUTURE) IMPLANT
SUT VIC AB 2-0 CTB1 (SUTURE) IMPLANT
SUT VICRYL 4-0 PS2 18IN ABS (SUTURE) IMPLANT
SUTURE FIBERWR 2-0 18 17.9 3/8 (SUTURE) ×4 IMPLANT
SUTURE FIBERWR#2 38 REV NDL BL (SUTURE) IMPLANT
SYR CONTROL 10ML LL (SYRINGE) ×3 IMPLANT
TOWEL OR 17X24 6PK STRL BLUE (TOWEL DISPOSABLE) ×3 IMPLANT
TOWEL OR 17X26 10 PK STRL BLUE (TOWEL DISPOSABLE) ×3 IMPLANT
WASHER 3.5MM (Orthopedic Implant) ×6 IMPLANT
WASHER FLAT 6.5MM (Washer) ×3 IMPLANT
WATER STERILE IRR 1000ML POUR (IV SOLUTION) ×3 IMPLANT
YANKAUER SUCT BULB TIP NO VENT (SUCTIONS) IMPLANT

## 2019-04-17 NOTE — H&P (Signed)
Molly HawthorneRita C Fischer is an 73 y.o. female.   Chief Complaint: Comminuted complex left intra-articular distal humerus fracture/supracondylar humerus fracture displaced HPI: Patient presents for evaluation and treatment of the of their upper extremity predicament. The patient denies neck, back, chest or  abdominal pain. The patient notes that they have no lower extremity problems. The patients primary complaint is noted. We are planning surgical care pathway for the upper extremity.  Past Medical History:  Diagnosis Date  . Abrasion of right heel    during admission 10/ 2019 right heel open skin due to movement on sheet  . ADD (attention deficit disorder)   . Anemia, mild   . Anxiety   . Arthritis    "joints, back"  . At high risk for falls    10-19-2018 pt has fell twice in past week, tripped  . Chronic back pain    "all over my back" (10/02/2013)  . Crushing injury of arm, right 02/06/1989's   "it was crushed; wore cast from fingers to top of my shoulder" (11/25/2  . Crushing injury of left wrist 05/11/1989's  . DOE (dyspnea on exertion)   . Essential tremor   . Fibromyalgia   . History of palpitations 2002   recurrent  . History of panic attacks   . History of pneumonia 08/21/2018   w/ acute respiratory failure/ hypoxia  . History of sepsis    admission 08-21-2018  secondary to uropathy obstructive from ureteral stone   . Hyperlipidemia   . Hypertension    10-19-2018 PER PT AMLODIPINE ON HOLD SINCE 10/ 2019 DUE TO KIDNEY ISSUES PER DOCTOR  . Idiopathic scoliosis 04/19/2013  . Left ureteral stone   . Migraines    "once/month" (10/02/2013)  . Osteoporosis   . Other vitamin B12 deficiency anemia   . PONV (postoperative nausea and vomiting)    As a child  . Pulmonary nodules/lesions, multiple   . Renal insufficiency   . S/P gastric bypass 09/16/2003  . S/P insertion of spinal cord stimulator    per pt remote is missing  . Wears dentures    upper  . Wears glasses   . Wears glasses      Past Surgical History:  Procedure Laterality Date  . ABDOMINAL HYSTERECTOMY  1980's   W/  BSO  AND APPENDECTOMY  . CARDIAC CATHETERIZATION  12-13-2002    dr Eden Emmsnishan   normal coronaries  . CYSTOSCOPY WITH URETEROSCOPY AND STENT PLACEMENT Left 09/27/2018   Procedure: LEFT  URETEROSCOPY, HOLMIUM LASER LITHOTRIPSY;  Surgeon: Crist FatHerrick, Benjamin W, MD;  Location: [facility];  Service: Urology;  Laterality: Left;  . D & C HYSTERSCOPY RESECTION MYOMECTOMY  1980s  . IR NEPHROSTOMY PLACEMENT LEFT  08/22/2018  . IR NEPHROSTOMY PLACEMENT LEFT  12/08/2018  . KNEE ARTHROSCOPY Right 03-31-2001    dr Simonne Comeaplington @[facility]   . NEPHROLITHOTOMY Left 10/20/2018   Procedure: LEFT NEPHROLITHOTOMY PERCUTANEOUS;  Surgeon: Crist FatHerrick, Benjamin W, MD;  Location: [facility];  Service: Urology;  Laterality: Left;  . REDUCTION MAMMAPLASTY Bilateral 2001  . ROBOT ASSISTED LAPAROSCOPIC NEPHRECTOMY Left 12/25/2018   Procedure: XI ROBOTIC ASSISTED LAPAROSCOPIC NEPHRECTOMY, LEFT FLANK EXPLORATION WITH REMOVAL OF BATTERY PACK;  Surgeon: Crist FatHerrick, Benjamin W, MD;  Location: [facility];  Service: Urology;  Laterality: Left;  . ROUX-EN-Y GASTRIC BYPASS  09-16-2003   dr Judie Petitm. Daphine Deutschermartin  @[facility]    via Laparoscopy w/ gastrojejunostomy  . SPINAL CORD STIMULATOR IMPLANT  2016   10-19-2018  PER PT HAS REMOTE BUT HAS NOT USED IT SINCE  DEC 2018  . TONSILLECTOMY AND ADENOIDECTOMY  1955  . TUBAL LIGATION  1980's    Family History  Adopted: Yes  Problem Relation Age of Onset  . COPD Mother   . Emphysema Mother   . Heart attack Mother   . Other Mother        tobacco abuse   Social History:  reports that she has never smoked. She has never used smokeless tobacco. She reports that she does not drink alcohol or use drugs.  Allergies:  Allergies  Allergen Reactions  . Adhesive [Tape] Itching    Medications Prior to Admission  Medication Sig Dispense Refill  . acetaminophen (TYLENOL) 500 MG tablet Take 2,000 mg by mouth 3 (three) times daily as needed for  moderate pain.    [name]. amitriptyline (ELAVIL) 75 MG tablet Take 75 mg by mouth at bedtime.    [name]. amphetamine-dextroamphetamine (ADDERALL XR) 20 MG 24 hr capsule TAKE 2 CAPSULES BY MOUTH EVERY MORNING (Patient taking differently: Take 40 mg by mouth daily. ) 60 capsule 0  . clonazePAM (KLONOPIN) 0.5 MG tablet 1/2 po bid prn (Patient taking differently: Take 0.125 mg by mouth 2 (two) times daily as needed for anxiety. ) 30 tablet 1  . furosemide (LASIX) 40 MG tablet Take 1 tablet (40 mg total) by mouth daily as needed for fluid or edema. 30 tablet 0  . neomycin-bacitracin-polymyxin (NEOSPORIN) ointment Apply 1 application topically daily as needed for wound care.    [name]. oxyCODONE (ROXICODONE) 15 MG immediate release tablet Take 1 tablet (15 mg total) by mouth 3 (three) times daily. (Patient taking differently: Take 15 mg by mouth 4 (four) times daily. ) 10 tablet 0  . SUMAtriptan (IMITREX) 50 MG tablet TAKE 1 TABLET AT ONSET OF HEADACHE. MAY REPEAT IN 2 HOURS IF NEEDED (Patient taking differently: Take 50 mg by mouth every 2 (two) hours as needed for migraine or headache. Max 2 doses in 24 hours) 10 tablet 1  . venlafaxine XR (EFFEXOR-XR) 75 MG 24 hr capsule Take 2 capsules (150 mg total) by mouth daily. 180 capsule 3    Results for orders placed or performed during the hospital encounter of 04/16/19 (from the past 48 hour(s))  SARS Coronavirus 2 (CEPHEID - Performed in [facility]), Hosp Order     Status: None   Collection Time: 04/16/19 10:46 AM  Result Value Ref Range   SARS Coronavirus 2 NEGATIVE NEGATIVE    Comment: (NOTE) If result is NEGATIVE SARS-CoV-2 target nucleic acids are NOT DETECTED. The SARS-CoV-2 RNA is generally detectable in upper and lower  respiratory specimens during the acute phase of infection. The lowest  concentration of SARS-CoV-2 viral copies this assay can detect is 250  copies / mL. A negative result does not preclude SARS-CoV-2 infection  and should not be  used as the sole basis for treatment or other  patient management decisions.  A negative result may occur with  improper specimen collection / handling, submission of specimen other  than nasopharyngeal swab, presence of viral mutation(s) within the  areas targeted by this assay, and inadequate number of viral copies  (<250 copies / mL). A negative result must be combined with clinical  observations, patient history, and epidemiological information. If result is POSITIVE SARS-CoV-2 target nucleic acids are DETECTED. The SARS-CoV-2 RNA is generally detectable in upper and lower  respiratory specimens dur ing the acute phase of infection.  Positive  results are indicative of active infection with SARS-CoV-2.  Clinical  correlation with patient history and other diagnostic information is  necessary to determine patient infection status.  Positive results do  not rule out bacterial infection or co-infection with other viruses. If result is PRESUMPTIVE POSTIVE SARS-CoV-2 nucleic acids MAY BE PRESENT.   A presumptive positive result was obtained on the submitted specimen  and confirmed on repeat testing.  While 2019 novel coronavirus  (SARS-CoV-2) nucleic acids may be present in the submitted sample  additional confirmatory testing may be necessary for epidemiological  and / or clinical management purposes  to differentiate between  SARS-CoV-2 and other Sarbecovirus currently known to infect humans.  If clinically indicated additional testing with an alternate test  methodology [phone] is advised. The SARS-CoV-2 RNA is generally  detectable in upper and lower respiratory sp ecimens during the acute  phase of infection. The expected result is Negative. Fact Sheet for Patients:  BoilerBrush.com.cyhttps://www.fda.gov/media/136312/download Fact Sheet for Healthcare Providers: https://pope.com/https://www.fda.gov/media/136313/download This test is not yet approved or cleared by the [place] FDA and has been authorized  for detection and/or diagnosis of SARS-CoV-2 by FDA under an Emergency Use Authorization (EUA).  This EUA will remain in effect (meaning this test can be used) for the duration of the COVID-19 declaration under Section 564(b)(1) of the Act, 21 U.S.C. section 360bbb-3(b)(1), unless the authorization is terminated or revoked sooner. Performed at [facility], [address] [place], [place] [address]    No results found.  Review of Systems  Respiratory: Negative.   Cardiovascular: Negative.   Gastrointestinal: Negative.   Genitourinary: Negative.     Blood pressure 135/74, pulse 86, temperature 98.2 F (36.8 C), temperature source Oral, resp. rate 18, height 5\' 5"  (1.651 m), weight 59 kg, SpO2 97 %. Physical Exam  Comminuted complex distal humerus fracture left upper extremity right upper extremity stable skin is ecchymotic and stable at present time.  The patient has no evidence of infection.  X-rays do reveal a comminuted complex and articular fracture CT scan is notable for the comminuted nature as well as a capitellar shearing component and intra-articular disarray of the trochlea and spool.  I reviewed this with her at length and the findings.  I discussed her open and honest legs is unfortunately a very challenging issue nevertheless we will do everything in our power to get her back to a functional level.   The patient is alert and oriented in no acute distress. The patient complains of pain in the affected upper extremity.  The patient is noted to have a normal HEENT exam. Lung fields show equal chest expansion and no shortness of breath. Abdomen exam is nontender without distention. Lower extremity examination does not show any fracture dislocation or blood clot symptoms. Pelvis is stable and the neck and back are stable and nontender. Assessment/Plan We will plan for open reduction internal fixation with olecranon osteotomy and neuro lysis with  transposition of the ulnar nerve.  She understands this.  We are planning surgery for your upper extremity. The risk and benefits of surgery to include risk of bleeding, infection, anesthesia,  damage to normal structures and failure of the surgery to accomplish its intended goals of relieving symptoms and restoring function have been discussed in detail. With this in mind we plan to proceed. I have specifically discussed with the patient the pre-and postoperative regime and the dos and don'ts and risk and benefits in great detail. Risk and benefits of surgery also include risk of dystrophy(CRPS), chronic nerve pain, failure of  the healing process to go onto completion and other inherent risks of surgery The relavent the pathophysiology of the disease/injury process, as well as the alternatives for treatment and postoperative course of action has been discussed in great detail with the patient who desires to proceed.  We will do everything in our power to help you (the patient) restore function to the upper extremity. It is a pleasure to see this patient today.  Extensive counseling performed today and all questions encouraged and answered.  Willa Frater III, MD 04/17/2019, 2:31 PM

## 2019-04-17 NOTE — Anesthesia Postprocedure Evaluation (Signed)
Anesthesia Post Note  Patient: [name]  Procedure(s) Performed: Open reduction internal fixation left supracondylar humerus fracture with olecranon osteotomy and ulnar nerve release and anterior transposition and repair of structures as necessary (Left )     Patient location during evaluation: PACU Anesthesia Type: Regional Level of consciousness: lethargic, patient cooperative and awake Pain management: pain level controlled Vital Signs Assessment: post-procedure vital signs reviewed and stable Respiratory status: spontaneous breathing, nonlabored ventilation, respiratory function stable and patient connected to nasal cannula oxygen Cardiovascular status: blood pressure returned to baseline and stable Postop Assessment: no apparent nausea or vomiting Anesthetic complications: no    Last Vitals:  Vitals:   04/17/19 1938 04/17/19 1943  BP: 139/75 (!) 142/78  Pulse: 95 93  Resp: 13 15  Temp: (!) 36.3 C 36.6 C  SpO2: 95% 95%    Last Pain:  Vitals:   04/17/19 1922  TempSrc:   PainSc: Asleep                 Keyuna Cuthrell COKER

## 2019-04-17 NOTE — Transfer of Care (Signed)
Immediate Anesthesia Transfer of Care Note  Patient: [name]  Procedure(s) Performed: Open reduction internal fixation left supracondylar humerus fracture with olecranon osteotomy and ulnar nerve release and anterior transposition and repair of structures as necessary (Left )  Patient Location: PACU  Anesthesia Type:General  Level of Consciousness: awake, drowsy and patient cooperative  Airway & Oxygen Therapy: Patient Spontanous Breathing  Post-op Assessment: Report given to RN and Post -op Vital signs reviewed and stable  Post vital signs: Reviewed and stable  Last Vitals:  Vitals Value Taken Time  BP 129/76 04/17/2019  6:26 PM  Temp 36.3 C 04/17/2019  6:26 PM  Pulse 88 04/17/2019  6:32 PM  Resp 19 04/17/2019  6:32 PM  SpO2 92 % 04/17/2019  6:32 PM  Vitals shown include unvalidated device data.  Last Pain:  Vitals:   04/17/19 1826  TempSrc:   PainSc: Asleep      Patients Stated Pain Goal: 2 (32/95/18 8416)  Complications: No apparent anesthesia complications

## 2019-04-17 NOTE — Progress Notes (Signed)
Pharmacy Antibiotic Note  [name] is a 73 y.o. female admitted on 04/17/2019 with surgical prophylaxis.  Pharmacy has been consulted for vancomycin dosing.  Patient received vancomycin 1g at 1144 AM today.  Plan: Will give 1g vancomycin tomorrow at noon.   Noted plans for d/c tomorrow.  If doesn't discharge, will need 1g vancomycin q 36 hrs.  Height: 5\' 5"  (165.1 cm) Weight: 130 lb (59 kg) IBW/kg (Calculated) : 57  Temp (24hrs), Avg:97.6 F (36.4 C), Min:97.3 F (36.3 C), Max:98.2 F (36.8 C)  No results for input(s): WBC, CREATININE, LATICACIDVEN, VANCOTROUGH, VANCOPEAK, VANCORANDOM, GENTTROUGH, GENTPEAK, GENTRANDOM, TOBRATROUGH, TOBRAPEAK, TOBRARND, AMIKACINPEAK, AMIKACINTROU, AMIKACIN in the last 168 hours.  Estimated Creatinine Clearance: 31 mL/min (A) (by C-G formula based on SCr of 1.5 mg/dL (H)).    Allergies  Allergen Reactions  . Adhesive [Tape] Itching    Antimicrobials this admission: Vancomycin 6/9 >  Dose adjustments this admission:  Microbiology results: none  Thank you for allowing pharmacy to be a part of this patient's care.  Marguerite Olea, [facility] Clinical Pharmacist Phone [phone]  04/17/2019 8:30 PM

## 2019-04-17 NOTE — Anesthesia Procedure Notes (Signed)
Procedure Name: Intubation Date/Time: 04/17/2019 2:48 PM Performed by: Oletta Lamas, CRNA Pre-anesthesia Checklist: Patient identified, Emergency Drugs available, Suction available and Patient being monitored Patient Re-evaluated:Patient Re-evaluated prior to induction Oxygen Delivery Method: Circle System Utilized Preoxygenation: Pre-oxygenation with 100% oxygen Induction Type: IV induction Ventilation: Mask ventilation without difficulty Laryngoscope Size: Miller and 2 Grade View: Grade I Tube type: Oral Number of attempts: 1 Airway Equipment and Method: Stylet Placement Confirmation: ETT inserted through vocal cords under direct vision,  positive ETCO2 and breath sounds checked- equal and bilateral Secured at: 21 cm Tube secured with: Tape Dental Injury: Teeth and Oropharynx as per pre-operative assessment

## 2019-04-17 NOTE — Op Note (Signed)
Operative note: 04/17/2019  Roseanne Kaufman MD  Preoperative diagnosis comminuted complex left supracondylar humerus fracture with intra-articular extension loss of the trochlea spool a significant comminuted distally based fracture was notable.  Unfortunately the capitellum was sheared and was only a shell of itself.  Postop diagnosis: The same  Surgical procedure: #1 open reduction internal fixation comminuted complex distal supracondylar humerus fracture with intra-articular extension left elbow #2 olecranon osteotomy with fixation utilizing cable and screw #3 6 view radiographic series left elbow #4 ulnar nerve decompression and anterior transposition with flexor pronator release about the fascial regions.  #5 allograft bone graft cavitary defect about the capitellum  Surgeon Roseanne Kaufman  Anesthesia General  Estimated blood loss less than 50 cc  Drains none  Indications for the procedure: This 73 year old female presents above-mentioned diagnosis have counseled her in [place] risk-benefit surgery.  Unfortunately the CT scan shows advanced abnormality.  There is no evidence of instability infection or dystrophy but she has advanced abnormality about the area of question.  I reviewed this with the patient and her family.  I have considered a total elbow arthroplasty but given her age and other issues including chronic pain etc. I would like to try to give her some type of a better construct for purposes of long-term elbow function with out the risk of loosening.  Nevertheless if she fails this operative attempt the neck step would be a total elbow arthroplasty.  Operative procedure patient was seen by myself and anesthesia taken the operative theater underwent smooth induction of general anesthesia this was a general endotracheal anesthetic.  Following this I supervised turning the patient and we placed her in a lateral position we then placed a U drape and I scrubbed her with 2 Hibiclens  scrubs followed by 10-minute surgical Betadine scrub and paint.  Following this sterile field was secured and timeout was observed.  Tourniquet was placed sterilely and insufflated.  Utilitarian posterior incision was made dissection was carried down to the triceps fascial planes were created without difficulty once this was complete I then identified the ulnar nerve the ulnar nerve was released about the arcade of Struthers, medial intermuscular septum, 2 heads of the FCU, Osborne's ligament and the cubital tunnel itself.  It was gently mobilized and transposed.  At this time I did perform a flexor pronator lengthening with fascial release.  Following this I then performed a medial intermuscular septum release and left an area base distally for later coverage of the plate.  Once this was complete we then performed a very careful and cautious approach to the ulna.  I dissected an area placed a guidepin and then marked this with radiograph or chevron osteotomy of the ulna.  The osteotomy about the olecranon was completed on the far side with osteotome and on the near side with oscillating saw according to standard technique it was then folded back on this allowed access to the distal humerus which was highly comminuted.  The highly comminuted complex fracture underwent irrigation debridement and following this I then pieced back together and held provisional fixation with Kirschner wires I was able to apply a medial plate to restore the medial column this was very helpful in restoring the patient's ulnohumeral joint was look quite well.  Unfortunately the capitellum was split into 2 pieces and was simply a shell of itself.  There is no meaningful bone and there was large amount of bone loss I thus placed 4 different FiberWire sutures through 2 drill holes for  a total of 8 drill holes to capture the shell of the capitellum.  I then bone grafted the cavitary defect.  I then drilled drill holes anterior and  posterior about the humerus and used a suture passer to reconstruct the capitellum with FiberWire.  Following this I then preset all of these issues placed the bone graft and checked x-rays.  I held the plate with provisional fixation followed by application of the medial plate.  Medial plate had excellent purchase and I was pleased with this.  Unfortunately there is no option for a lateral plate or a posterior lateral plate as there is just nothing but a shell of capitellum and this was a very low shear fracture.  The CT scan correlated with this.  Thus we tied down the sutures and placed additional bone graft and this actually provided reasonable fixation without gross disturbance.  This time we then irrigated copiously and repaired the olecranon osteotomy with cable and a 95 mm partially-threaded 6 5 cancellus screw the patient tolerated this well and there were no complicating features.  Once this was complete the patient then underwent irrigation followed by closure of the fascia with Vicryl.  The ulnar nerve was transposed in an anterior position it was tension-free and looked quite well.  I did use the medial intermuscular septum to cover the medial plate so there would be no impingement of the ulnar nerve.  The patient tolerated this well the wound was closed with 3-0 Vicryl followed by 3-0 Prolene in a long-arm splint with stirrups was placed without difficulty.  She was taken to recovery in stable condition.  I discussed all issues with her husband Jonny RuizJohn at phone #[phone].  I discussed all issues with anesthesia.  Should be admitted overnight for pain control.  She has chronic pain problems and takes oxycodone 30 mg a day thus I do feel she will have difficulty with pain and will place her on a high opioid pathway.  We discussed all issues do's and don'ts and all questions have been addressed.  She had excellent pulse soft compartments and a warm pink hand at the conclusion of the  procedure.  Going forward we will immobilize her for 4 weeks and then begin some gentle interval range of motion with a long-arm splint.  All questions have been encouraged and answered  I would give her a variable prognosis given the fragility of the bone and the extreme comminution.  Dominica SeverinWilliam Cheronda Erck MD

## 2019-04-17 NOTE — Anesthesia Preprocedure Evaluation (Addendum)
Anesthesia Evaluation  Patient identified by MRN, date of birth, ID band Patient awake    Reviewed: Allergy & Precautions, NPO status , Patient's Chart, lab work & pertinent test results  History of Anesthesia Complications (+) PONV and history of anesthetic complications  Airway Mallampati: II  TM Distance: >3 FB Neck ROM: Full    Dental  (+) Dental Advisory Given   Pulmonary neg pulmonary ROS,    breath sounds clear to auscultation       Cardiovascular hypertension, + DOE   Rhythm:Regular Rate:Normal     Neuro/Psych  Headaches, PSYCHIATRIC DISORDERS Anxiety Depression  Neuromuscular disease    GI/Hepatic GERD  ,  Endo/Other    Renal/GU CRFRenal disease     Musculoskeletal  (+) Arthritis , Fibromyalgia -  Abdominal   Peds  Hematology  (+) Blood dyscrasia, anemia ,   Anesthesia Other Findings  - Left ventricle: The cavity size was normal. Systolic function was   normal. The estimated ejection fraction was in the range of 60%   to 65%. Wall motion was normal; there were no regional wall   motion abnormalities. Doppler parameters are consistent with   abnormal left ventricular relaxation (grade 1 diastolic   dysfunction). - Aortic valve: Trileaflet; mildly thickened, mildly calcified   leaflets. - Aorta: Ascending aortic diameter: 38 mm (S). - Ascending aorta: The ascending aorta was mildly dilated. - Mitral valve: Calcified annulus. Mildly thickened leaflets . - Pericardium, extracardiac: Hepatic cyst noted. Consider dedicated   abdominal ultrasound for further illustration.  Reproductive/Obstetrics                            Anesthesia Physical Anesthesia Plan  ASA: II  Anesthesia Plan: General and Regional   Post-op Pain Management:  Regional for Post-op pain   Induction: Intravenous  PONV Risk Score and Plan: 4 or greater and Ondansetron, Dexamethasone and Propofol  infusion  Airway Management Planned: Oral ETT  Additional Equipment: None  Intra-op Plan:   Post-operative Plan: Extubation in OR  Informed Consent: I have reviewed the patients History and Physical, chart, labs and discussed the procedure including the risks, benefits and alternatives for the proposed anesthesia with the patient or authorized representative who has indicated his/her understanding and acceptance.     Dental advisory given  Plan Discussed with: CRNA, Surgeon and Anesthesiologist  Anesthesia Plan Comments:        Anesthesia Quick Evaluation

## 2019-04-18 ENCOUNTER — Encounter (HOSPITAL_COMMUNITY): Payer: Self-pay | Admitting: General Practice

## 2019-04-18 DIAGNOSIS — S42422A Displaced comminuted supracondylar fracture without intercondylar fracture of left humerus, initial encounter for closed fracture: Secondary | ICD-10-CM | POA: Diagnosis not present

## 2019-04-18 LAB — SURGICAL PCR SCREEN
MRSA, PCR: POSITIVE — AB
Staphylococcus aureus: POSITIVE — AB

## 2019-04-18 MED ORDER — SUMATRIPTAN SUCCINATE 50 MG PO TABS
50.0000 mg | ORAL_TABLET | ORAL | Status: AC
  Administered 2019-04-18: 50 mg via ORAL

## 2019-04-18 MED ORDER — MUPIROCIN 2 % EX OINT
TOPICAL_OINTMENT | Freq: Two times a day (BID) | CUTANEOUS | Status: DC
  Administered 2019-04-18 – 2019-04-19 (×2): via NASAL
  Administered 2019-04-19: 1 via NASAL
  Administered 2019-04-20: 09:00:00 via NASAL
  Filled 2019-04-18 (×3): qty 22

## 2019-04-18 MED ORDER — CHLORHEXIDINE GLUCONATE CLOTH 2 % EX PADS
6.0000 | MEDICATED_PAD | Freq: Every day | CUTANEOUS | Status: DC
  Administered 2019-04-18 – 2019-04-20 (×3): 6 via TOPICAL

## 2019-04-18 MED ORDER — PROMETHAZINE HCL 25 MG/ML IJ SOLN
12.5000 mg | Freq: Four times a day (QID) | INTRAMUSCULAR | Status: DC | PRN
  Administered 2019-04-18: 12.5 mg via INTRAVENOUS
  Filled 2019-04-18: qty 1

## 2019-04-18 MED ORDER — SUMATRIPTAN SUCCINATE 100 MG PO TABS
100.0000 mg | ORAL_TABLET | ORAL | Status: AC | PRN
  Administered 2019-04-19: 100 mg via ORAL
  Administered 2019-04-19: 50 mg via ORAL
  Administered 2019-04-19: 100 mg via ORAL
  Filled 2019-04-18 (×4): qty 1

## 2019-04-18 MED ORDER — PROPOFOL 1000 MG/100ML IV EMUL
INTRAVENOUS | Status: AC
  Filled 2019-04-18: qty 100

## 2019-04-18 NOTE — Plan of Care (Signed)

## 2019-04-18 NOTE — TOC Initial Note (Signed)
Transition of Care [facility]) - Initial/Assessment Note    Patient Details  Name: [name] MRN: [id number] Date of Birth: Aug 21, 1946  Transition of Care [facility]) CM/SW Contact:    Ninfa Meeker, RN Phone Number: [phone] (working remotely) 04/18/2019, 1:02 PM  Clinical Narrative:   73 yr old female admitted with Left comminuted humeral fracture, she underwent a Left humeral ORIF. Case manager contacted patient via phone concerning recommendation for HHOT/PT eval and DME. Patient wishes to discuss with her husband before committing to therapy. Should she agree, she has used [facility] in the past and they would be her agency of choice. CM will continue to monitor.                    Barriers to Discharge: No Barriers Identified   Patient Goals and CMS Choice        Expected Discharge Plan and Services     Discharge Planning Services: CM Consult Post Acute Care Choice: Durable Medical Equipment Living arrangements for the past 2 months: Single Family Home Expected Discharge Date: 04/18/19               DME Arranged: 3-N-1 DME Agency: [facility] Date DME Agency Contacted: 04/18/19 Time DME Agency Contacted: 54 Representative spoke with at DME Agency: [place]: NA          Prior Living Arrangements/Services Living arrangements for the past 2 months: [place] Lives with:: Spouse          Need for Family Participation in Patient Care: Yes (Comment) Care giver support system in place?: Yes (comment)      Activities of Daily Living Home Assistive Devices/Equipment: Blood pressure cuff, CBG Meter, Cane (specify quad or straight), Walker (specify type), Shower chair with back, Eyeglasses, Dentures (specify type)(Upper) ADL Screening (condition at time of admission) Patient's cognitive ability adequate to safely complete daily activities?: Yes Is the patient deaf or have difficulty hearing?: No Does the patient have difficulty seeing, even  when wearing glasses/contacts?: No Does the patient have difficulty concentrating, remembering, or making decisions?: No Patient able to express need for assistance with ADLs?: Yes Does the patient have difficulty dressing or bathing?: No Independently performs ADLs?: Yes (appropriate for developmental age) Does the patient have difficulty walking or climbing stairs?: Yes Weakness of Legs: None Weakness of Arms/Hands: None  Permission Sought/Granted Permission sought to share information with : Case Manager                Emotional Assessment     Affect (typically observed): Accepting Orientation: : Oriented to Self, Oriented to Place, Oriented to  Time, Oriented to Situation Alcohol / Substance Use: Not Applicable    Admission diagnosis:  Left elbow intra-articular supracondylar humerus fracture displaced Patient Active Problem List   Diagnosis Date Noted  . Closed displaced comminuted supracondylar fracture of left humerus without intercondylar fracture 04/17/2019  . Nonfunctioning kidney 12/25/2018  . Hydronephrosis with ureteral stricture, not elsewhere classified 12/21/2018  . Malnutrition of moderate degree 12/08/2018  . Delirium 12/07/2018  . Hydronephrosis of left kidney 12/07/2018  . Pyelonephritis 12/07/2018  . Protein-calorie malnutrition, severe 08/24/2018  . Pressure injury of skin 08/22/2018  . Obstruction of left ureteropelvic junction (UPJ) due to stone 08/21/2018  . Hypoalbuminemia 08/21/2018  . Chronic pain disorder 08/21/2018  . Thrombocytopenia ([place]) 08/21/2018  . Acute renal failure (ARF) ([place]) 08/21/2018  . Compression fracture of spine ([place]) 08/21/2018  . Acute respiratory failure with hypoxemia (  HCC)   . Hematoma 06/03/2018  . Dysphagia 02/07/2018  . Generalized anxiety disorder 07/04/2017  . Nonintractable headache 07/04/2017  . Exertional dyspnea 08/05/2016  . Sepsis (HCC) 03/07/2015  . Hypokalemia 03/07/2015  . Acute encephalopathy  03/06/2015  . Fever 03/06/2015  . Chronic back pain 01/15/2015  . Obesity (BMI 30-39.9) 10/15/2013  . Pulmonary nodules 10/15/2013  . CAP (community acquired pneumonia) 10/02/2013  . Essential hypertension 08/24/2013  . Idiopathic scoliosis 04/19/2013  . Back pain 04/19/2013  . Osteoporosis 04/19/2013  . Edema 04/19/2013  . Mild diastolic dysfunction 01/01/2013  . Leg pain, bilateral 12/16/2012  . ADD (attention deficit disorder) 05/24/2012  . Involuntary movements 05/24/2012  . DIZZINESS 12/31/2009  . TREMOR, ESSENTIAL 12/12/2009  . PALPITATIONS, RECURRENT 12/12/2009  . OTHER VITAMIN B12 DEFICIENCY ANEMIA 10/30/2009  . FEMALE STRESS INCONTINENCE 10/28/2009  . MEMORY LOSS 10/28/2009  . Morbid obesity (HCC) 12/03/2008  . GERD 12/03/2008  . ANEMIA, MILD 06/26/2008  . DEPRESSION/ANXIETY 12/28/2007  . PICA 12/28/2007  . ACUTE BRONCHITIS 12/28/2007  . FATIGUE 12/28/2007  . Hyperlipidemia 05/15/2007  . ARTIFICIAL MENOPAUSE 05/15/2007  . Myalgia and myositis, unspecified 05/15/2007  . HEADACHE 05/15/2007  . CHEST PAIN, ATYPICAL 05/15/2007  . SYMPTOM, PAIN, ABDOMINAL, EPIGASTRIC 05/15/2007  . REDUCTION MAMMOPLASTY, HX OF 05/15/2007  . PSTPRC STATUS, BARIATRIC SURGERY 05/15/2007   PCP:  Donato SchultzLowne Chase, [name], DO Pharmacy:   [facility] [place], [place] - [address] [place] [id number] Phone: [phone] Fax: [phone]     Social Determinants of Health (SDOH) Interventions    Readmission Risk Interventions No flowsheet data found.

## 2019-04-18 NOTE — Evaluation (Signed)
Physical Therapy Evaluation Patient Details Name: [name] MRN: [id number] DOB: 09-18-1946 Today's Date: 04/18/2019   History of Present Illness  Pt is a 73 y.o. female admitted 04/17/19 with left comminuted intra-articular distal humerus fx/supracondylar humerus fx. S/p L humeral ORIF, olecranon fixation, ulnar nerve decompression and flexor pronator release on 6/9. PMH includesHTN, chronic back pain, ADD, anxiety, RUE crus injury (1990s), fibromyalgia.    Clinical Impression  Pt presents with an overall decrease in functional mobility secondary to above. PTA, pt mod indep with SPC, lives with husband who has been providing assist since initial UE fx. Educ on precautions, positioning, therex, and importance of mobility. Today, pt able to initiate transfer and gait training with HHA; limited by fatigue. Pt would benefit from continued acute PT services to maximize functional mobility and independence prior to d/c home.     Follow Up Recommendations Follow surgeon's recommendation for DC plan and follow-up therapies;Supervision for mobility/OOB    Equipment Recommendations  None recommended by PT    Recommendations for Other Services       Precautions / Restrictions Precautions Precautions: Fall Shoulder Interventions: Shoulder sling/immobilizer Required Braces or Orthoses: Sling Restrictions Weight Bearing Restrictions: Yes Other Position/Activity Restrictions: adhered to NWB through L UE      Mobility  Bed Mobility Overal bed mobility: Needs Assistance Bed Mobility: Supine to Sit     Supine to sit: Min assist;HOB elevated     General bed mobility comments: MinA to assist trunk elevation, RUE rail support  Transfers Overall transfer level: Needs assistance Equipment used: 1 person hand held assist Transfers: Sit to/from Stand Sit to Stand: Min assist Stand pivot transfers: Min assist       General transfer comment: MinA for trunk elevation and RUE HHA to  stabilize  Ambulation/Gait Ambulation/Gait assistance: Min assist;Min guard Gait Distance (Feet): 60 Feet Assistive device: 1 person hand held assist Gait Pattern/deviations: Step-through pattern;Decreased stride length;Trunk flexed Gait velocity: Decreased Gait velocity interpretation: 1.31 - 2.62 ft/sec, indicative of limited community ambulator General Gait Details: MinA for HHA to maintain balance; min guard ambulating without HHA; baseline scoliosis with forward flexed posture. Pt limited by fatigue and LUE feeling heavy in cast. Uses SPC at baseline  Stairs            Wheelchair Mobility    Modified Rankin (Stroke Patients Only)       Balance Overall balance assessment: Needs assistance Sitting-balance support: Single extremity supported Sitting balance-Leahy Scale: Fair     Standing balance support: Single extremity supported;No upper extremity supported;During functional activity Standing balance-Leahy Scale: Fair Standing balance comment: Can static stand without UE support                             Pertinent Vitals/Pain Pain Assessment: Faces Faces Pain Scale: Hurts a little bit Pain Location: LUE Pain Descriptors / Indicators: Discomfort;Guarding Pain Intervention(s): Monitored during session;Ice applied;Repositioned    Home Living Family/patient expects to be discharged to:: Private residence Living Arrangements: Spouse/significant other;Other relatives Available Help at Discharge: Family;Available 24 hours/day Type of Home: Other(Comment)(townhome) Home Access: Stairs to enter Entrance Stairs-Rails: None Entrance Stairs-Number of Steps: 1 Home Layout: Two level;Able to live on main level with bedroom/bathroom Home Equipment: Gilford Rile - 2 wheels;Cane - single point;Grab bars - toilet;Hand held shower head;Shower seat Additional Comments: Husband in good shape to provide physical assist if needed. They care for 8-y.o. granddaughter     Prior Function Level  of Independence: Independent with assistive device(s)         Comments: Pt was mod indep with SPC until UE fx a few days ago; husband has been assisting as needed since then. At baseline, does not ascend steps to second story.      Hand Dominance   Dominant Hand: Right    Extremity/Trunk Assessment   Upper Extremity Assessment Upper Extremity Assessment: LUE deficits/detail LUE Deficits / Details: splinted from MPs to proximal of elbow, edematous fingers, instructed in edema management LUE: Unable to fully assess due to immobilization LUE Coordination: decreased fine motor;decreased gross motor    Lower Extremity Assessment Lower Extremity Assessment: Generalized weakness    Cervical / Trunk Assessment Cervical / Trunk Assessment: Other exceptions Cervical / Trunk Exceptions: Baseline scoliosis/kyphosis with very forward flexed posture  Communication   Communication: No difficulties  Cognition Arousal/Alertness: Awake/alert Behavior During Therapy: WFL for tasks assessed/performed Overall Cognitive Status: Within Functional Limits for tasks assessed                                        General Comments      Exercises     Assessment/Plan    PT Assessment Patient needs continued PT services  PT Problem List Decreased strength;Decreased range of motion;Decreased activity tolerance;Decreased balance;Decreased mobility;Decreased knowledge of precautions;Pain       PT Treatment Interventions DME instruction;Gait training;Stair training;Functional mobility training;Therapeutic exercise;Balance training;Patient/family education;Therapeutic activities    PT Goals (Current goals can be found in the Care Plan section)  Acute Rehab PT Goals Patient Stated Goal: go home PT Goal Formulation: With patient Time For Goal Achievement: 05/02/19 Potential to Achieve Goals: Good    Frequency Min 5X/week   Barriers to discharge         Co-evaluation               AM-PAC PT "6 Clicks" Mobility  Outcome Measure Help needed turning from your back to your side while in a flat bed without using bedrails?: A Little Help needed moving from lying on your back to sitting on the side of a flat bed without using bedrails?: A Little Help needed moving to and from a bed to a chair (including a wheelchair)?: A Little Help needed standing up from a chair using your arms (e.g., wheelchair or bedside chair)?: A Little Help needed to walk in hospital room?: A Little Help needed climbing 3-5 steps with a railing? : A Little 6 Click Score: 18    End of Session   Activity Tolerance: Patient tolerated treatment well Patient left: in chair;with call bell/phone within reach;with chair alarm set Nurse Communication: Mobility status PT Visit Diagnosis: Other abnormalities of gait and mobility (R26.89);Pain;Muscle weakness (generalized) (M62.81) Pain - Right/Left: Left Pain - part of body: Arm    Time: 0950-1010 PT Time Calculation (min) (ACUTE ONLY): 20 min   Charges:   PT Evaluation $PT Eval Moderate Complexity: 1 Mod        Ina HomesJaclyn Babyboy Loya, PT, DPT Acute Rehabilitation Services  Pager [phone] Office [phone]  Malachy ChamberJaclyn L Harper [place] 04/18/2019, 11:41 AM

## 2019-04-18 NOTE — Progress Notes (Signed)
Patient ID: [name], female   DOB: [birth year], 73 y.o.   MRN: [id number] Patient is postop day 1 status post reconstruction left elbow  Her pain is well controlled.  Her main complaint is a headache.  We are going to give an additional increased dosage of Imitrex.  I discussed with the patient and her nurse Neoma Laming is doing a wonderful job helping [place] with this issue.  Patient has no evidence of instability infection or dystrophic reaction.  I discussed all issues in detail and the findings.  At present juncture she is intact to sensation and motor function about the hand and has swelling appropriate to the surgery.  Chest is clear HEENT is within normal limits other than the headache she looks quite well.  Abdomen is nontender.  She is tolerating p.o. food and drink.  Lower extremity examination shows no signs of DVT or other problems.  I reviewed all issues with patient at length and the findings.  We will plan for continued observation and IV antibiotics and likely DC tomorrow.  Appreciate everyone's help with this very nice lady.  I did discuss with the patient the issues of her fracture and the complexity.  She understands realistic goals for the future.  Yobani Schertzer MD

## 2019-04-18 NOTE — Evaluation (Signed)
Occupational Therapy Evaluation Patient Details Name: [name] MRN: [id number] DOB: 02/19/1946 Today's Date: 04/18/2019    History of Present Illness Pt is a 73 y.o. female admitted 04/17/19 with left comminuted intra-articular distal humerus fx/supracondylar humerus fx. S/p L humeral ORIF, olecranon fixation, ulnar nerve decompression and flexor pronator release on 6/9. PMH includesHTN, chronic back pain, ADD, anxiety, RUE crus injury (1990s), fibromyalgia.   Clinical Impression   Pt typically walks with a cane and is modified independent in self care. She has a hx of falls. Pt presents with generalized weakness, impaired standing balance, L UE pain and significant kyphosis. She requires up to moderate assistance for ADL. Educated pt in sling use, positioning L UE in bed and chair and edema management techniques. Pt has a supportive family to assist her at home. Recommending HHOT. Will follow.    Follow Up Recommendations  Home health OT;Supervision/Assistance - 24 hour    Equipment Recommendations  3 in 1 bedside commode(if pt is agreeable)    Recommendations for Other Services       Precautions / Restrictions Precautions Precautions: Fall Shoulder Interventions: Shoulder sling/immobilizer Required Braces or Orthoses: Sling Restrictions Weight Bearing Restrictions: Yes Other Position/Activity Restrictions: adhered to NWB through L UE      Mobility Bed Mobility Overal bed mobility: Needs Assistance Bed Mobility: Supine to Sit     Supine to sit: Min assist;HOB elevated     General bed mobility comments: MinA to assist trunk elevation, RUE rail support  Transfers Overall transfer level: Needs assistance Equipment used: 1 person hand held assist Transfers: Sit to/from [facility] Sit to Stand: Min assist Stand pivot transfers: Min assist       General transfer comment: MinA for trunk elevation and RUE HHA to stabilize    Balance Overall balance  assessment: Needs assistance Sitting-balance support: Single extremity supported Sitting balance-Leahy Scale: Fair     Standing balance support: Single extremity supported;No upper extremity supported;During functional activity Standing balance-Leahy Scale: Fair Standing balance comment: Can static stand without UE support                           ADL either performed or assessed with clinical judgement   ADL Overall ADL's : Needs assistance/impaired Eating/Feeding: Minimal assistance;Sitting Eating/Feeding Details (indicate cue type and reason): assist to open packages Grooming: Brushing hair;Sitting;Total assistance   Upper Body Bathing: Moderate assistance;Sitting   Lower Body Bathing: Moderate assistance;Sit to/from stand   Upper Body Dressing : Moderate assistance;Sitting   Lower Body Dressing: Moderate assistance;Sit to/from stand   Toilet Transfer: Minimal assistance;Stand-pivot;BSC   Toileting- Clothing Manipulation and Hygiene: Set up;Sitting/lateral lean       Functional mobility during ADLs: Minimal assistance;Min guard General ADL Comments: reviewed information on shoulder handout that applied to pt     Vision Baseline Vision/History: Wears glasses Wears Glasses: Reading only Patient Visual Report: No change from baseline       Perception     Praxis      Pertinent Vitals/Pain Pain Assessment: Faces Faces Pain Scale: Hurts a little bit Pain Location: LUE Pain Descriptors / Indicators: Discomfort;Guarding Pain Intervention(s): Monitored during session;Repositioned     Hand Dominance Right   Extremity/Trunk Assessment Upper Extremity Assessment Upper Extremity Assessment: LUE deficits/detail LUE Deficits / Details: splinted from MPs to proximal of elbow, edematous fingers, instructed in edema management LUE: Unable to fully assess due to immobilization LUE Coordination: decreased fine motor;decreased gross motor  Lower Extremity  Assessment Lower Extremity Assessment: Generalized weakness   Cervical / Trunk Assessment Cervical / Trunk Assessment: Other exceptions Cervical / Trunk Exceptions: Baseline scoliosis/kyphosis with very forward flexed posture   Communication Communication Communication: No difficulties   Cognition Arousal/Alertness: Awake/alert Behavior During Therapy: WFL for tasks assessed/performed Overall Cognitive Status: Within Functional Limits for tasks assessed                                     General Comments       Exercises     Shoulder Instructions      Home Living Family/patient expects to be discharged to:: Private residence Living Arrangements: Spouse/significant other;Other relatives(71 year old great grandchild) Available Help at Discharge: Family;Available 24 hours/day Type of Home: Other(Comment)(townhome) Home Access: Stairs to enter Entrance Stairs-Number of Steps: 1 Entrance Stairs-Rails: None Home Layout: Two level;Able to live on main level with bedroom/bathroom     Bathroom Shower/Tub: Walk-in shower   Bathroom Toilet: Handicapped height     Home Equipment: Environmental consultantWalker - 2 wheels;Cane - single point;Grab bars - toilet;Hand held shower head;Shower seat   Additional Comments: Husband in good shape to provide physical assist if needed      Prior Functioning/Environment Level of Independence: Independent with assistive device(s)        Comments: Pt was mod indep with SPC until UE fx a few days ago; husband has been assisting as needed since then. At baseline, does not ascend steps to second story.         OT Problem List: Decreased strength;Decreased activity tolerance;Impaired balance (sitting and/or standing);Decreased knowledge of use of DME or AE;Pain;Impaired UE functional use;Decreased coordination;Decreased range of motion      OT Treatment/Interventions: Self-care/ADL training;DME and/or AE instruction;Patient/family education;Balance  training;Therapeutic activities;Therapeutic exercise    OT Goals(Current goals can be found in the care plan section) Acute Rehab OT Goals Patient Stated Goal: go home OT Goal Formulation: With patient Time For Goal Achievement: 05/02/19 Potential to Achieve Goals: Good ADL Goals Pt Will Perform Grooming: with min assist;standing Pt Will Perform Upper Body Dressing: with min assist;sitting Pt Will Perform Lower Body Dressing: with min assist;sit to/from stand Pt Will Transfer to Toilet: with min guard assist;ambulating;bedside commode Pt Will Perform Toileting - Clothing Manipulation and hygiene: with min guard assist;sit to/from stand Additional ADL Goal #1: Pt will state edema management techniques as instructed.  OT Frequency: Min 2X/week   Barriers to D/C:            Co-evaluation              AM-PAC OT "6 Clicks" Daily Activity     Outcome Measure Help from another person eating meals?: A Little Help from another person taking care of personal grooming?: A Lot Help from another person toileting, which includes using toliet, bedpan, or urinal?: A Little Help from another person bathing (including washing, rinsing, drying)?: A Lot Help from another person to put on and taking off regular upper body clothing?: A Lot Help from another person to put on and taking off regular lower body clothing?: A Lot 6 Click Score: 14   End of Session Equipment Utilized During Treatment: (LUE sling) Nurse Communication: Mobility status;Precautions  Activity Tolerance: Patient tolerated treatment well Patient left: in chair;with call bell/phone within reach;with chair alarm set  OT Visit Diagnosis: Unsteadiness on feet (R26.81);Other abnormalities of gait and mobility (R26.89);Pain;Muscle weakness (generalized) (  M62.81)                Time: 0920-1000 OT Time Calculation (min): 40 min Charges:  OT General Charges $OT Visit: 1 Visit OT Evaluation $OT Eval Moderate Complexity: 1  Mod OT Treatments $Self Care/Home Management : 8-22 mins  Martie RoundJulie Avneet Ashmore, OTR/L Acute Rehabilitation Services Pager: [phone] Office: [phone]  Evern BioMayberry, Polina Burmaster [place] 04/18/2019, 11:15 AM

## 2019-04-18 NOTE — Progress Notes (Signed)
Orthopedic Tech Progress Note Patient Details:  [name] 12-09-1945 [id number] RN called and asked me to bring up cast padding/cotton web 3inch and 4inch, 3 feet of stockinette, and 3inch as well as 4inch casting material. DR will be applying a cast . Ortho Devices Type of Ortho Device: Cotton web roll, Stockinette, Other (comment) Ortho Device/Splint Interventions: Other (comment)   Post Interventions Patient Tolerated: Other (comment) Instructions Provided: Other (comment)   Janit Pagan 04/18/2019, 12:12 PM

## 2019-04-18 NOTE — Progress Notes (Signed)
Pt continues to c/o migraine. Pharmacy called to inquired about the dose of Imitrex pt can have safely. Pt is currently maxed out on this medication. MD paged. Awaiting response. Continue to monitor.

## 2019-04-19 DIAGNOSIS — S42422A Displaced comminuted supracondylar fracture without intercondylar fracture of left humerus, initial encounter for closed fracture: Secondary | ICD-10-CM | POA: Diagnosis not present

## 2019-04-19 MED ORDER — OXYCODONE HCL 15 MG PO TABS: 15.0000 mg | ORAL_TABLET | Freq: Four times a day (QID) | ORAL | 0 refills | Status: DC

## 2019-04-19 NOTE — Progress Notes (Signed)
PT Cancellation Note  Patient Details Name: ZIAN MOHAMED MRN: [id number] DOB: Aug 04, 1946   Cancelled Treatment:    Reason Eval/Treat Not Completed: Pain limiting ability to participate;Fatigue/lethargy limiting ability to participate. Will follow-up for PT treatment as schedule permits.  Mabeline Caras, PT, DPT Acute Rehabilitation Services  Pager [phone] Office [place] 04/19/2019, 12:43 PM

## 2019-04-19 NOTE — Progress Notes (Signed)
OT Cancellation Note  Patient Details Name: [name] MRN: [id number] DOB: 1945-11-29   Cancelled Treatment:    Reason Eval/Treat Not Completed: Pain limiting ability to participate;Other (comment)("migraine"). Second attempt to see pt. Pt reported a severe migraine earlier this morning. She is now resting in bed with lights off. Will try to reattempt later this afternoon.   Tyrone Schimke, OT Acute Rehabilitation Services Pager: [phone] Office: [phone]  04/19/2019, 12:44 PM

## 2019-04-19 NOTE — Plan of Care (Signed)

## 2019-04-19 NOTE — Progress Notes (Signed)
Patient ID: [name], female   DOB: 1946-07-20, 73 y.o.   MRN: [id number] Patient is doing reasonably well.  Patient was very tired as she took a nap today and missed some of her therapy.  I discussed all issues with the patient at length.  Her headaches are under reasonable control.  At present juncture she is neurovascularly intact in the left arm.  She has no signs of infection dystrophy or vascular compromise.  We will plan for discharge home tomorrow with the above-mentioned precautions in her discharge summary etc.  At present juncture I am quite pleased to see her looking so well.  She is ambulatory and fairly steady on her feet.  She has no complications today.  She and I discussed the plan of care including pain management etc.  Montserrath Madding MD

## 2019-04-19 NOTE — Plan of Care (Signed)
?  Problem: Elimination: ?Goal: Will not experience complications related to bowel motility ?Outcome: Progressing ?  ?Problem: Pain Managment: ?Goal: General experience of comfort will improve ?Outcome: Progressing ?  ?Problem: Safety: ?Goal: Ability to remain free from injury will improve ?Outcome: Progressing ?  ?

## 2019-04-19 NOTE — Discharge Instructions (Signed)
Please be very careful with your arm and purposeful in your activities.  Please do not hesitate to ask for help with ambulation.  Our office will call so that we may see you in 12 days.  Keep bandage clean and dry.  Call for any problems.  No smoking.  Criteria for driving a car: you should be off your pain medicine for 7-8 hours, able to drive one handed(confident), thinking clearly and feeling able in your judgement to drive. Continue elevation as it will decrease swelling.  If instructed by MD move your fingers within the confines of the bandage/splint.  Use ice if instructed by your MD. Call immediately for any sudden loss of feeling in your hand/arm or change in functional abilities of the extremity.We recommend that you to take vitamin C 1000 mg a day to promote healing. We also recommend that if you require  pain medicine that you take a stool softener to prevent constipation as most pain medicines will have constipation side effects. We recommend either Peri-Colace or Senokot and recommend that you also consider adding MiraLAX as well to prevent the constipation affects from pain medicine if you are required to use them. These medicines are over the counter and may be purchased at a local pharmacy. A cup of yogurt and a probiotic can also be helpful during the recovery process as the medicines can disrupt your intestinal environment.

## 2019-04-19 NOTE — Discharge Summary (Signed)
Physician Discharge Summary  Patient ID: [name] MRN: [id number] DOB/AGE: February 02, 1946 73 y.o.  Admit date: 04/17/2019 Discharge date:   Admission Diagnoses: Left elbow intra-articular supracondylar humerus fracture displaced Past Medical History:  Diagnosis Date  . Abrasion of right heel    during admission 10/ 2019 right heel open skin due to movement on sheet  . ADD (attention deficit disorder)   . Anemia, mild   . Anxiety   . Arthritis    "joints, back"  . At high risk for falls    10-19-2018 pt has fell twice in past week, tripped  . Chronic back pain    "all over my back" (10/02/2013)  . Crushing injury of arm, right 02/06/1989's   "it was crushed; wore cast from fingers to top of my shoulder" (11/25/2  . Crushing injury of left wrist 05/11/1989's  . DOE (dyspnea on exertion)   . Essential tremor   . Fibromyalgia   . History of palpitations 2002   recurrent  . History of panic attacks   . History of pneumonia 08/21/2018   w/ acute respiratory failure/ hypoxia  . History of sepsis    admission 08-21-2018  secondary to uropathy obstructive from ureteral stone   . Hyperlipidemia   . Hypertension    10-19-2018 PER PT AMLODIPINE ON HOLD SINCE 10/ 2019 DUE TO KIDNEY ISSUES PER DOCTOR  . Idiopathic scoliosis 04/19/2013  . Left ureteral stone   . Migraines    "once/month" (10/02/2013)  . Osteoporosis   . Other vitamin B12 deficiency anemia   . PONV (postoperative nausea and vomiting)    As a child  . Pulmonary nodules/lesions, multiple   . Renal insufficiency   . S/P gastric bypass 09/16/2003  . S/P insertion of spinal cord stimulator    per pt remote is missing  . Wears dentures    upper  . Wears glasses   . Wears glasses     Discharge Diagnoses:  Active Problems:   Closed displaced comminuted supracondylar fracture of left humerus without intercondylar fracture   Surgeries: Procedure(s): Open reduction internal fixation left supracondylar humerus fracture  with olecranon osteotomy and ulnar nerve release and anterior transposition and repair of structures as necessary on 04/17/2019    Consultants:   Discharged Condition: Improved  Hospital Course: [name] is an 73 y.o. female who was admitted 04/17/2019 with a chief complaint of No chief complaint on file. , and found to have a diagnosis of Left elbow intra-articular supracondylar humerus fracture displaced.  They were brought to the operating room on 04/17/2019 and underwent Procedure(s): Open reduction internal fixation left supracondylar humerus fracture with olecranon osteotomy and ulnar nerve release and anterior transposition and repair of structures as necessary.    They were given perioperative antibiotics:  Anti-infectives (From admission, onward)   Start     Dose/Rate Route Frequency Ordered Stop   04/18/19 1200  vancomycin (VANCOCIN) IVPB 1000 mg/200 mL premix     1,000 mg 200 mL/hr over 60 Minutes Intravenous Every 36 hours 04/17/19 2032 04/18/19 1234   04/17/19 1115  vancomycin (VANCOCIN) IVPB 1000 mg/200 mL premix     1,000 mg 200 mL/hr over 60 Minutes Intravenous On call to O.R. 04/17/19 1112 04/17/19 1250    .  They were given sequential compression devices, early ambulation, and Other (comment) for DVT prophylaxis.  Recent vital signs:  Patient Vitals for the past 24 hrs:  BP Temp Temp src Pulse Resp SpO2  04/19/19 0748 134/83  98.9 F (37.2 C) Oral 88 18 94 %  04/19/19 0427 110/72 98.3 F (36.8 C) Oral 75 17 98 %  04/18/19 2055 123/77 98 F (36.7 C) Oral 84 16 95 %  .  Recent laboratory studies: No results found.  Discharge Medications:   Allergies as of 04/19/2019      Reactions   Adhesive [tape] Itching      Medication List    TAKE these medications   acetaminophen 500 MG tablet Commonly known as: TYLENOL Take 2,000 mg by mouth 3 (three) times daily as needed for moderate pain.   amitriptyline 75 MG tablet Commonly known as: ELAVIL Take 75 mg by  mouth at bedtime.   amphetamine-dextroamphetamine 20 MG 24 hr capsule Commonly known as: ADDERALL XR TAKE 2 CAPSULES BY MOUTH EVERY MORNING What changed:   how much to take  how to take this  when to take this  additional instructions   clonazePAM 0.5 MG tablet Commonly known as: KLONOPIN 1/2 po bid prn What changed:   how much to take  how to take this  when to take this  reasons to take this  additional instructions   furosemide 40 MG tablet Commonly known as: LASIX Take 1 tablet (40 mg total) by mouth daily as needed for fluid or edema.   neomycin-bacitracin-polymyxin ointment Commonly known as: NEOSPORIN Apply 1 application topically daily as needed for wound care.   oxyCODONE 15 MG immediate release tablet Commonly known as: ROXICODONE Take 1 tablet (15 mg total) by mouth 4 (four) times daily.   SUMAtriptan 50 MG tablet Commonly known as: IMITREX TAKE 1 TABLET AT ONSET OF HEADACHE. MAY REPEAT IN 2 HOURS IF NEEDED What changed: See the new instructions.   venlafaxine XR 75 MG 24 hr capsule Commonly known as: EFFEXOR-XR Take 2 capsules (150 mg total) by mouth daily.            Durable Medical Equipment  (From admission, onward)         Start     Ordered   04/18/19 1235  For home use only DME 3 n 1  Once     04/18/19 1234          Diagnostic Studies: Ct Elbow Left Wo Contrast  Result Date: 04/12/2019 CLINICAL DATA:  [place].  Complex elbow fractures. EXAM: CT OF THE UPPER LEFT EXTREMITY WITHOUT CONTRAST, CT THREE-DIMENSIONAL CT IMAGE RENDERING ON INDEPENDENT WORKSTATION. TECHNIQUE: Multidetector CT imaging of the upper left extremity was performed according to the standard protocol. COMPARISON:  None. FINDINGS: Complex comminuted intra-articular fracture of the distal humerus. There is a longitudinal component extending down through the middle of the joint. Associated comminuted fractures of the medial and lateral epicondyles. The capitellum  is articulating with the radial head but is completely separated from the base of the humerus and rotated anteriorly. Small fracture fragments are noted in the joint. The radius and ulna are intact. I do not see any definite fractures. Extensive associated surrounding soft tissue swelling/edema/fluid/hemorrhage and large joint effusion. IMPRESSION: 1. Complex comminuted intra-articular fracture of the distal humerus. 2. The radius and ulna are intact. Electronically Signed   By: Marijo Sanes M.D.   On: 04/12/2019 17:37   Ct 3d Independent Darreld Mclean  Result Date: 04/12/2019 CLINICAL DATA:  [place].  Complex elbow fractures. EXAM: CT OF THE UPPER LEFT EXTREMITY WITHOUT CONTRAST, CT THREE-DIMENSIONAL CT IMAGE RENDERING ON INDEPENDENT WORKSTATION. TECHNIQUE: Multidetector CT imaging of the upper left extremity was performed according  to the standard protocol. COMPARISON:  None. FINDINGS: Complex comminuted intra-articular fracture of the distal humerus. There is a longitudinal component extending down through the middle of the joint. Associated comminuted fractures of the medial and lateral epicondyles. The capitellum is articulating with the radial head but is completely separated from the base of the humerus and rotated anteriorly. Small fracture fragments are noted in the joint. The radius and ulna are intact. I do not see any definite fractures. Extensive associated surrounding soft tissue swelling/edema/fluid/hemorrhage and large joint effusion. IMPRESSION: 1. Complex comminuted intra-articular fracture of the distal humerus. 2. The radius and ulna are intact. Electronically Signed   By: Rudie MeyerP.  Gallerani M.D.   On: 04/12/2019 17:37    They benefited maximally from their hospital stay and there were no complications.     Disposition: Discharge disposition: 01-Home or Self Care      Discharge Instructions    Call MD / Call 911   Complete by: As directed    If you experience chest pain or shortness of  breath, CALL 911 and be transported to the hospital emergency room.  If you develope a fever above 101 F, pus (white drainage) or increased drainage or redness at the wound, or calf pain, call your surgeon's office.   Constipation Prevention   Complete by: As directed    Drink plenty of fluids.  Prune juice may be helpful.  You may use a stool softener, such as Colace (over the counter) 100 mg twice a day.  Use MiraLax (over the counter) for constipation as needed.   Diet - low sodium heart healthy   Complete by: As directed    Increase activity slowly as tolerated   Complete by: As directed      Follow-up Information    Dominica SeverinGramig, Dawan Farney, MD Follow up in 13 day(s).   Specialty: Orthopedic Surgery Why: We will call to see you back in our office in 13 days. Contact information: [address] [place] [address] [phone]         Patient is doing well.  She is tolerating her diet and voiding.  No complications.  We discussed her pain management routine.  She sees pain management regularly and I will presided with the pain management prescriptions at this juncture.  She will continue thoughtful careful living.  She has no signs of infection DVT or respiratory compromise.  There is no signs of UTI.    Status post open reduction internal fixation reconstruction left elbow secondary to a supracondylar humerus fracture Signed: Oletta CohnWilliam M Becci Batty III 04/19/2019, 5:16 PM

## 2019-04-20 ENCOUNTER — Other Ambulatory Visit: Payer: Self-pay | Admitting: Family Medicine

## 2019-04-20 DIAGNOSIS — R519 Headache, unspecified: Secondary | ICD-10-CM

## 2019-04-20 DIAGNOSIS — S42422A Displaced comminuted supracondylar fracture without intercondylar fracture of left humerus, initial encounter for closed fracture: Secondary | ICD-10-CM | POA: Diagnosis not present

## 2019-04-20 NOTE — Progress Notes (Signed)
Physical Therapy Treatment Patient Details Name: [name] MRN: [id number] DOB: [birth year] Today's Date: 04/20/2019    History of Present Illness Pt is a 73 y.o. female admitted 04/17/19 with left comminuted intra-articular distal humerus fx/supracondylar humerus fx. S/p L humeral ORIF, olecranon fixation, ulnar nerve decompression and flexor pronator release on 6/9. PMH includesHTN, chronic back pain, ADD, anxiety, RUE crus injury (1990s), fibromyalgia.    PT Comments    Pt received in bed. She reports feeling better today. She required min guard assist bed mobility, transfers, and ambulation 120 feet with SPC. Increased time and effort required for all mobility skills. Fair standing balance noted, unsteady gait but no overt LOB. Husband will be able to provided needed level of assist at home.    Follow Up Recommendations  Follow surgeon's recommendation for DC plan and follow-up therapies;Supervision for mobility/OOB     Equipment Recommendations  None recommended by PT    Recommendations for Other Services       Precautions / Restrictions Precautions Precautions: Fall Shoulder Interventions: Shoulder sling/immobilizer;For comfort Required Braces or Orthoses: Sling Restrictions Weight Bearing Restrictions: Yes LUE Weight Bearing: Non weight bearing LLE Weight Bearing: Non weight bearing    Mobility  Bed Mobility Overal bed mobility: Needs Assistance       Supine to sit: Min guard;HOB elevated     General bed mobility comments: +rail, increased time and effort, no physical assist  Transfers Overall transfer level: Needs assistance Equipment used: Straight cane   Sit to Stand: Min guard         General transfer comment: min guard for safety, increased time to stabilize initial standing balance  Ambulation/Gait Ambulation/Gait assistance: Min guard Gait Distance (Feet): 120 Feet Assistive device: Straight cane Gait Pattern/deviations: Step-through  pattern;Decreased stride length;Trunk flexed Gait velocity: Decreased Gait velocity interpretation: 1.31 - 2.62 ft/sec, indicative of limited community ambulator General Gait Details: min guard for safety, unsteady but no overt LOB. Scoliosis at baseline with flexed posture.   Stairs Stairs: (verbally instructed in ascend/descend threshold into house. Pt reports no concerns or need to practice.)           Wheelchair Mobility    Modified Rankin (Stroke Patients Only)       Balance Overall balance assessment: Needs assistance;History of Falls Sitting-balance support: No upper extremity supported;Feet supported Sitting balance-Leahy Scale: Fair     Standing balance support: Single extremity supported;During functional activity Standing balance-Leahy Scale: Fair Standing balance comment: Can static stand without UE support, SPC for amb                            Cognition Arousal/Alertness: Awake/alert Behavior During Therapy: WFL for tasks assessed/performed Overall Cognitive Status: Within Functional Limits for tasks assessed                                        Exercises      General Comments        Pertinent Vitals/Pain Pain Assessment: Faces Faces Pain Scale: Hurts a little bit Pain Location: LUE Pain Descriptors / Indicators: Discomfort;Guarding Pain Intervention(s): Monitored during session;Repositioned    Home Living                      Prior Function            PT Goals (current goals  can now be found in the care plan section) Acute Rehab PT Goals Patient Stated Goal: go home PT Goal Formulation: With patient Time For Goal Achievement: 05/02/19 Potential to Achieve Goals: Good Progress towards PT goals: Progressing toward goals    Frequency    Min 5X/week      PT Plan Current plan remains appropriate    Co-evaluation              AM-PAC PT "6 Clicks" Mobility   Outcome Measure  Help  needed turning from your back to your side while in a flat bed without using bedrails?: A Little Help needed moving from lying on your back to sitting on the side of a flat bed without using bedrails?: A Little Help needed moving to and from a bed to a chair (including a wheelchair)?: A Little Help needed standing up from a chair using your arms (e.g., wheelchair or bedside chair)?: A Little Help needed to walk in hospital room?: A Little Help needed climbing 3-5 steps with a railing? : A Little 6 Click Score: 18    End of Session Equipment Utilized During Treatment: Gait belt;Other (comment)(sling) Activity Tolerance: Patient tolerated treatment well Patient left: in chair;with call bell/phone within reach;with chair alarm set Nurse Communication: Mobility status PT Visit Diagnosis: Other abnormalities of gait and mobility (R26.89);Pain;Muscle weakness (generalized) (M62.81) Pain - Right/Left: Left Pain - part of body: Arm     Time: [id number] PT Time Calculation (min) (ACUTE ONLY): 13 min  Charges:  $Gait Training: 8-22 mins                     Aida RaiderWendy Neal Trulson, PT  Office # [phone] Pager [phone]    Ilda FoilGarrow, Danna Casella Rene 04/20/2019, 9:36 AM

## 2019-04-20 NOTE — Anesthesia Postprocedure Evaluation (Signed)
Anesthesia Post Note  Patient: [name]  Procedure(s) Performed: Open reduction internal fixation left supracondylar humerus fracture with olecranon osteotomy and ulnar nerve release and anterior transposition and repair of structures as necessary (Left )     Patient location during evaluation: PACU Anesthesia Type: Regional Level of consciousness: awake and alert Pain management: pain level controlled Vital Signs Assessment: post-procedure vital signs reviewed and stable Respiratory status: spontaneous breathing, nonlabored ventilation, respiratory function stable and patient connected to nasal cannula oxygen Cardiovascular status: blood pressure returned to baseline and stable Postop Assessment: no apparent nausea or vomiting Anesthetic complications: no    Last Vitals:  Vitals:   04/20/19 0350 04/20/19 0741  BP: (!) 140/93 129/89  Pulse: 76 80  Resp: 15 16  Temp: 36.5 C 36.7 C  SpO2: 97% 95%    Last Pain:  Vitals:   04/20/19 0843  TempSrc:   PainSc: 0-No pain                 Clydette Privitera COKER

## 2019-04-20 NOTE — Progress Notes (Signed)
RN gave pt discharge instructions and pt state understanding. Pain medication escribed to pharmacy in [place]. Pt belongings at bedside. Husband called he is on the way, NT helping her get dressed. IV removed

## 2019-05-07 ENCOUNTER — Other Ambulatory Visit: Payer: Self-pay | Admitting: Family Medicine

## 2019-05-07 DIAGNOSIS — F988 Other specified behavioral and emotional disorders with onset usually occurring in childhood and adolescence: Secondary | ICD-10-CM

## 2019-05-08 ENCOUNTER — Other Ambulatory Visit: Payer: Self-pay | Admitting: Physician Assistant

## 2019-05-08 DIAGNOSIS — M25552 Pain in left hip: Secondary | ICD-10-CM

## 2019-05-08 NOTE — Telephone Encounter (Signed)
Requesting: Adderrall XR Contract: 02/07/2018 UDS: 10/24/2018, low risk, next screening 04/25/2019 Last OV: 03/22/2019 Next OV: 09/25/2019 Last Refill: 04/03/2019, #[id number] RF Database:   Please advise

## 2019-05-09 ENCOUNTER — Other Ambulatory Visit: Payer: Medicare Other

## 2019-05-10 ENCOUNTER — Ambulatory Visit
Admission: RE | Admit: 2019-05-10 | Discharge: 2019-05-10 | Disposition: A | Discharge status: Home / Self Care | Payer: Medicare Other | Source: Ambulatory Visit | Attending: Physician Assistant | Admitting: Physician Assistant

## 2019-05-10 DIAGNOSIS — M25552 Pain in left hip: Secondary | ICD-10-CM

## 2019-05-15 ENCOUNTER — Other Ambulatory Visit: Payer: Medicare Other

## 2019-05-24 ENCOUNTER — Other Ambulatory Visit: Payer: Medicare Other

## 2019-05-31 ENCOUNTER — Inpatient Hospital Stay (HOSPITAL_BASED_OUTPATIENT_CLINIC_OR_DEPARTMENT_OTHER)
Admission: EM | Admit: 2019-05-31 | Discharge: 2019-06-04 | DRG: 482 | Disposition: A | Discharge status: Home Health | Payer: Medicare Other | Attending: Internal Medicine | Admitting: Internal Medicine

## 2019-05-31 ENCOUNTER — Other Ambulatory Visit: Payer: Self-pay

## 2019-05-31 ENCOUNTER — Emergency Department (HOSPITAL_BASED_OUTPATIENT_CLINIC_OR_DEPARTMENT_OTHER): Payer: Medicare Other

## 2019-05-31 ENCOUNTER — Encounter (HOSPITAL_BASED_OUTPATIENT_CLINIC_OR_DEPARTMENT_OTHER): Payer: Self-pay | Admitting: Emergency Medicine

## 2019-05-31 DIAGNOSIS — Z8701 Personal history of pneumonia (recurrent): Secondary | ICD-10-CM | POA: Diagnosis not present

## 2019-05-31 DIAGNOSIS — K219 Gastro-esophageal reflux disease without esophagitis: Secondary | ICD-10-CM | POA: Diagnosis present

## 2019-05-31 DIAGNOSIS — F341 Dysthymic disorder: Secondary | ICD-10-CM | POA: Diagnosis not present

## 2019-05-31 DIAGNOSIS — Z9682 Presence of neurostimulator: Secondary | ICD-10-CM

## 2019-05-31 DIAGNOSIS — Y92012 Bathroom of single-family (private) house as the place of occurrence of the external cause: Secondary | ICD-10-CM | POA: Diagnosis not present

## 2019-05-31 DIAGNOSIS — Z905 Acquired absence of kidney: Secondary | ICD-10-CM

## 2019-05-31 DIAGNOSIS — F418 Other specified anxiety disorders: Secondary | ICD-10-CM | POA: Diagnosis present

## 2019-05-31 DIAGNOSIS — D539 Nutritional anemia, unspecified: Secondary | ICD-10-CM | POA: Diagnosis present

## 2019-05-31 DIAGNOSIS — Z20828 Contact with and (suspected) exposure to other viral communicable diseases: Secondary | ICD-10-CM | POA: Diagnosis not present

## 2019-05-31 DIAGNOSIS — I10 Essential (primary) hypertension: Secondary | ICD-10-CM | POA: Diagnosis present

## 2019-05-31 DIAGNOSIS — Z79899 Other long term (current) drug therapy: Secondary | ICD-10-CM

## 2019-05-31 DIAGNOSIS — M549 Dorsalgia, unspecified: Secondary | ICD-10-CM | POA: Diagnosis present

## 2019-05-31 DIAGNOSIS — Z9884 Bariatric surgery status: Secondary | ICD-10-CM | POA: Diagnosis not present

## 2019-05-31 DIAGNOSIS — W1811XA Fall from or off toilet without subsequent striking against object, initial encounter: Secondary | ICD-10-CM | POA: Diagnosis present

## 2019-05-31 DIAGNOSIS — Z90710 Acquired absence of both cervix and uterus: Secondary | ICD-10-CM

## 2019-05-31 DIAGNOSIS — F410 Panic disorder [episodic paroxysmal anxiety] without agoraphobia: Secondary | ICD-10-CM | POA: Diagnosis present

## 2019-05-31 DIAGNOSIS — F988 Other specified behavioral and emotional disorders with onset usually occurring in childhood and adolescence: Secondary | ICD-10-CM | POA: Diagnosis present

## 2019-05-31 DIAGNOSIS — S72144A Nondisplaced intertrochanteric fracture of right femur, initial encounter for closed fracture: Principal | ICD-10-CM

## 2019-05-31 DIAGNOSIS — M797 Fibromyalgia: Secondary | ICD-10-CM | POA: Diagnosis present

## 2019-05-31 DIAGNOSIS — S72009S Fracture of unspecified part of neck of unspecified femur, sequela: Secondary | ICD-10-CM

## 2019-05-31 DIAGNOSIS — G8929 Other chronic pain: Secondary | ICD-10-CM | POA: Diagnosis present

## 2019-05-31 DIAGNOSIS — M810 Age-related osteoporosis without current pathological fracture: Secondary | ICD-10-CM | POA: Diagnosis not present

## 2019-05-31 DIAGNOSIS — G250 Essential tremor: Secondary | ICD-10-CM | POA: Diagnosis present

## 2019-05-31 DIAGNOSIS — E785 Hyperlipidemia, unspecified: Secondary | ICD-10-CM | POA: Diagnosis not present

## 2019-05-31 DIAGNOSIS — Z419 Encounter for procedure for purposes other than remedying health state, unspecified: Secondary | ICD-10-CM

## 2019-05-31 LAB — SARS CORONAVIRUS 2 BY RT PCR (HOSPITAL ORDER, PERFORMED IN ~~LOC~~ HOSPITAL LAB): SARS Coronavirus 2: NEGATIVE

## 2019-05-31 LAB — CBC
HCT: 40.3 % (ref 36.0–46.0)
Hemoglobin: 12.4 g/dL (ref 12.0–15.0)
MCH: 32 pg (ref 26.0–34.0)
MCHC: 30.8 g/dL (ref 30.0–36.0)
MCV: 104.1 fL — ABNORMAL HIGH (ref 80.0–100.0)
Platelets: 242 10*3/uL (ref 150–400)
RBC: 3.87 MIL/uL (ref 3.87–5.11)
RDW: 13.4 % (ref 11.5–15.5)
WBC: 5.7 10*3/uL (ref 4.0–10.5)
nRBC: 0 % (ref 0.0–0.2)

## 2019-05-31 LAB — BASIC METABOLIC PANEL
Anion gap: 9 (ref 5–15)
BUN: 21 mg/dL (ref 8–23)
CO2: 24 mmol/L (ref 22–32)
Calcium: 9 mg/dL (ref 8.9–10.3)
Chloride: 105 mmol/L (ref 98–111)
Creatinine, Ser: 1.07 mg/dL — ABNORMAL HIGH (ref 0.44–1.00)
GFR calc Af Amer: >60 mL/min (ref >60)
GFR calc non Af Amer: 52 mL/min — ABNORMAL LOW (ref >60)
Glucose, Bld: 93 mg/dL (ref 70–99)
Potassium: 4.5 mmol/L (ref 3.5–5.1)
Sodium: 138 mmol/L (ref 135–145)

## 2019-05-31 LAB — PROTIME-INR
INR: 1 (ref 0.8–1.2)
Prothrombin Time: 12.8 seconds (ref 11.4–15.2)

## 2019-05-31 LAB — TYPE AND SCREEN
ABO/RH(D): A POS
Antibody Screen: NEGATIVE

## 2019-05-31 MED ORDER — SENNOSIDES-DOCUSATE SODIUM 8.6-50 MG PO TABS: 1.0000 | ORAL_TABLET | Freq: Every evening | ORAL | Status: DC | PRN

## 2019-05-31 MED ORDER — CLONAZEPAM 0.5 MG PO TABS
0.2500 mg | ORAL_TABLET | Freq: Two times a day (BID) | ORAL | Status: DC | PRN
  Administered 2019-05-31: 0.5 mg via ORAL
  Filled 2019-05-31: qty 1

## 2019-05-31 MED ORDER — FENTANYL CITRATE (PF) 100 MCG/2ML IJ SOLN
50.0000 ug | Freq: Once | INTRAMUSCULAR | Status: AC
  Administered 2019-05-31: 50 ug via INTRAVENOUS
  Filled 2019-05-31: qty 2

## 2019-05-31 MED ORDER — MORPHINE SULFATE (PF) 2 MG/ML IV SOLN
0.5000 mg | INTRAVENOUS | Status: DC | PRN
  Administered 2019-05-31 – 2019-06-02 (×9): 0.5 mg via INTRAVENOUS
  Filled 2019-05-31 (×9): qty 1

## 2019-05-31 MED ORDER — AMITRIPTYLINE HCL 50 MG PO TABS
75.0000 mg | ORAL_TABLET | Freq: Every day | ORAL | Status: DC
  Administered 2019-05-31 – 2019-06-03 (×4): 75 mg via ORAL
  Filled 2019-05-31 (×7): qty 1

## 2019-05-31 MED ORDER — VENLAFAXINE HCL ER 150 MG PO CP24
150.0000 mg | ORAL_CAPSULE | Freq: Every day | ORAL | Status: DC
  Administered 2019-06-01 – 2019-06-04 (×4): 150 mg via ORAL
  Filled 2019-05-31 (×4): qty 1

## 2019-05-31 MED ORDER — AMPHETAMINE-DEXTROAMPHET ER 10 MG PO CP24
20.0000 mg | ORAL_CAPSULE | Freq: Every day | ORAL | Status: DC
  Administered 2019-06-02 – 2019-06-04 (×3): 20 mg via ORAL
  Filled 2019-05-31 (×4): qty 2

## 2019-05-31 MED ORDER — OXYCODONE HCL 5 MG PO TABS
15.0000 mg | ORAL_TABLET | Freq: Four times a day (QID) | ORAL | Status: DC | PRN
  Administered 2019-05-31: 15 mg via ORAL
  Filled 2019-05-31 (×2): qty 3

## 2019-05-31 NOTE — ED Provider Notes (Signed)
[facility] EMERGENCY DEPARTMENT Provider Note   CSN: [id number] Arrival date & time: 05/31/19  1250    History   Chief Complaint Chief Complaint  Patient presents with  . Fall    HPI [name] is a 73 y.o. female with past medical history of hypertension, status post left nephrectomy, gastric bypass, chronic back pain, status post total hysterectomy, presenting to the emergency department with persistent right hip pain after a mechanical fall that occurred on Saturday.  Patient states she was getting up from the toilet on Saturday and she lost her balance, sitting back onto the toilet, however fell off the left edge of the toilet in between the wall and the toilet.  She states she was holding herself up but was unable to do so before her husband was able to help her and she states she let go from her grip and felt something move in her right hip.  She has been having severe pain in the right hip since that time that is not improved with her home Percocet as well as Tylenol.  She is been unable to ambulate secondary to pain.  She denies numbness or tingling in her foot.  No other new injuries.  Not on anticoagulation.     The history is provided by the patient.    Past Medical History:  Diagnosis Date  . Abrasion of right heel    during admission 10/ 2019 right heel open skin due to movement on sheet  . ADD (attention deficit disorder)   . Anemia, mild   . Anxiety   . Arthritis    "joints, back"  . At high risk for falls    10-19-2018 pt has fell twice in past week, tripped  . Chronic back pain    "all over my back" (10/02/2013)  . Crushing injury of arm, right 02/06/1989's   "it was crushed; wore cast from fingers to top of my shoulder" (11/25/2  . Crushing injury of left wrist 05/11/1989's  . DOE (dyspnea on exertion)   . Essential tremor   . Fibromyalgia   . History of palpitations 2002   recurrent  . History of panic attacks   . History of pneumonia  08/21/2018   w/ acute respiratory failure/ hypoxia  . History of sepsis    admission 08-21-2018  secondary to uropathy obstructive from ureteral stone   . Hyperlipidemia   . Hypertension    10-19-2018 PER PT AMLODIPINE ON HOLD SINCE 10/ 2019 DUE TO KIDNEY ISSUES PER DOCTOR  . Idiopathic scoliosis 04/19/2013  . Left ureteral stone   . Migraines    "once/month" (10/02/2013)  . Osteoporosis   . Other vitamin B12 deficiency anemia   . PONV (postoperative nausea and vomiting)    As a child  . Pulmonary nodules/lesions, multiple   . Renal insufficiency   . S/P gastric bypass 09/16/2003  . S/P insertion of spinal cord stimulator    per pt remote is missing  . Wears dentures    upper  . Wears glasses   . Wears glasses     Patient Active Problem List   Diagnosis Date Noted  . Hip fracture (HCC) 05/31/2019  . Closed displaced comminuted supracondylar fracture of left humerus without intercondylar fracture 04/17/2019  . Nonfunctioning kidney 12/25/2018  . Hydronephrosis with ureteral stricture, not elsewhere classified 12/21/2018  . Malnutrition of moderate degree 12/08/2018  . Delirium 12/07/2018  . Hydronephrosis of left kidney 12/07/2018  . Pyelonephritis 12/07/2018  .  Protein-calorie malnutrition, severe 08/24/2018  . Pressure injury of skin 08/22/2018  . Obstruction of left ureteropelvic junction (UPJ) due to stone 08/21/2018  . Hypoalbuminemia 08/21/2018  . Chronic pain disorder 08/21/2018  . Thrombocytopenia (HCC) 08/21/2018  . Acute renal failure (ARF) (HCC) 08/21/2018  . Compression fracture of spine (HCC) 08/21/2018  . Acute respiratory failure with hypoxemia (HCC)   . Hematoma 06/03/2018  . Dysphagia 02/07/2018  . Generalized anxiety disorder 07/04/2017  . Nonintractable headache 07/04/2017  . Exertional dyspnea 08/05/2016  . Sepsis (HCC) 03/07/2015  . Hypokalemia 03/07/2015  . Acute encephalopathy 03/06/2015  . Fever 03/06/2015  . Chronic back pain 01/15/2015  .  Obesity (BMI 30-39.9) 10/15/2013  . Pulmonary nodules 10/15/2013  . CAP (community acquired pneumonia) 10/02/2013  . Essential hypertension 08/24/2013  . Idiopathic scoliosis 04/19/2013  . Back pain 04/19/2013  . Osteoporosis 04/19/2013  . Edema 04/19/2013  . Mild diastolic dysfunction 01/01/2013  . Leg pain, bilateral 12/16/2012  . ADD (attention deficit disorder) 05/24/2012  . Involuntary movements 05/24/2012  . DIZZINESS 12/31/2009  . TREMOR, ESSENTIAL 12/12/2009  . PALPITATIONS, RECURRENT 12/12/2009  . OTHER VITAMIN B12 DEFICIENCY ANEMIA 10/30/2009  . FEMALE STRESS INCONTINENCE 10/28/2009  . MEMORY LOSS 10/28/2009  . Morbid obesity (HCC) 12/03/2008  . GERD 12/03/2008  . ANEMIA, MILD 06/26/2008  . DEPRESSION/ANXIETY 12/28/2007  . PICA 12/28/2007  . ACUTE BRONCHITIS 12/28/2007  . FATIGUE 12/28/2007  . Hyperlipidemia 05/15/2007  . ARTIFICIAL MENOPAUSE 05/15/2007  . Myalgia and myositis, unspecified 05/15/2007  . HEADACHE 05/15/2007  . CHEST PAIN, ATYPICAL 05/15/2007  . SYMPTOM, PAIN, ABDOMINAL, EPIGASTRIC 05/15/2007  . REDUCTION MAMMOPLASTY, HX OF 05/15/2007  . PSTPRC STATUS, BARIATRIC SURGERY 05/15/2007    Past Surgical History:  Procedure Laterality Date  . ABDOMINAL HYSTERECTOMY  1980's   W/  BSO  AND APPENDECTOMY  . CARDIAC CATHETERIZATION  12-13-2002    dr Eden Emmsnishan   normal coronaries  . CYSTOSCOPY WITH URETEROSCOPY AND STENT PLACEMENT Left 09/27/2018   Procedure: LEFT  URETEROSCOPY, HOLMIUM LASER LITHOTRIPSY;  Surgeon: Crist FatHerrick, Benjamin W, MD;  Location: [facility];  Service: Urology;  Laterality: Left;  . D & C HYSTERSCOPY RESECTION MYOMECTOMY  1980s  . HUMERUS FRACTURE SURGERY Left 04/17/2019    Comminuted complex left intra-articular distal humerus fracture/supracondylar humerus fracture displaced  . IR NEPHROSTOMY PLACEMENT LEFT  08/22/2018  . IR NEPHROSTOMY PLACEMENT LEFT  12/08/2018  . KNEE ARTHROSCOPY Right 03-31-2001    dr Simonne Comeaplington @[facility]   . NEPHROLITHOTOMY Left  10/20/2018   Procedure: LEFT NEPHROLITHOTOMY PERCUTANEOUS;  Surgeon: Crist FatHerrick, Benjamin W, MD;  Location: [facility];  Service: Urology;  Laterality: Left;  . ORIF HUMERUS FRACTURE Left 04/17/2019   Procedure: Open reduction internal fixation left supracondylar humerus fracture with olecranon osteotomy and ulnar nerve release and anterior transposition and repair of structures as necessary;  Surgeon: Dominica SeverinGramig, William, MD;  Location: [facility];  Service: Orthopedics;  Laterality: Left;  3 hrs  . REDUCTION MAMMAPLASTY Bilateral 2001  . ROBOT ASSISTED LAPAROSCOPIC NEPHRECTOMY Left 12/25/2018   Procedure: XI ROBOTIC ASSISTED LAPAROSCOPIC NEPHRECTOMY, LEFT FLANK EXPLORATION WITH REMOVAL OF BATTERY PACK;  Surgeon: Crist FatHerrick, Benjamin W, MD;  Location: [facility];  Service: Urology;  Laterality: Left;  . ROUX-EN-Y GASTRIC BYPASS  09-16-2003   dr Judie Petitm. Daphine Deutschermartin  @[facility]    via Laparoscopy w/ gastrojejunostomy  . SPINAL CORD STIMULATOR IMPLANT  2016   10-19-2018  PER PT HAS REMOTE BUT HAS NOT USED IT SINCE DEC 2018  . TONSILLECTOMY AND ADENOIDECTOMY  1955  .  TUBAL LIGATION  1980's     OB History   No obstetric history on file.      Home Medications    Prior to Admission medications   Medication Sig Start Date End Date Taking? Authorizing Provider  acetaminophen (TYLENOL) 500 MG tablet Take 2,000 mg by mouth 3 (three) times daily as needed for moderate pain.    [provider]  amitriptyline (ELAVIL) 75 MG tablet Take 75 mg by mouth at bedtime.    [provider]  amphetamine-dextroamphetamine (ADDERALL XR) 20 MG 24 hr capsule TAKE TWO CAPSULE BY MOUTH EVERY MORNING 05/08/19   Carollee Herter, Alferd Apa, DO  clonazePAM (KLONOPIN) 0.5 MG tablet 1/2 po bid prn Patient taking differently: Take 0.125 mg by mouth 2 (two) times daily as needed for anxiety.  03/22/19   Ann Held, DO  furosemide (LASIX) 40 MG tablet Take 1 tablet (40 mg total) by mouth daily as needed for fluid or edema. 01/02/19   Reyne Dumas, MD  neomycin-bacitracin-polymyxin (NEOSPORIN) ointment Apply 1 application topically daily as needed for wound care.    [provider]  oxyCODONE (ROXICODONE) 15 MG immediate release tablet Take 1 tablet (15 mg total) by mouth 4 (four) times daily. 04/19/19   Roseanne Kaufman, MD  SUMAtriptan (IMITREX) 50 MG tablet Take 1 tablet (50 mg total) by mouth every 2 (two) hours as needed for migraine or headache. Max 2 doses in 24 hours 04/25/19   Carollee Herter, Alferd Apa, DO  venlafaxine XR (EFFEXOR-XR) 75 MG 24 hr capsule Take 2 capsules (150 mg total) by mouth daily. 10/24/18   Ann Held, DO    Family History Family History  Adopted: Yes  Problem Relation Age of Onset  . COPD Mother   . Emphysema Mother   . Heart attack Mother   . Other Mother        tobacco abuse    Social History Social History   Tobacco Use  . Smoking status: Never Smoker  . Smokeless tobacco: Never Used  Substance Use Topics  . Alcohol use: No  . Drug use: No     Allergies   Adhesive [tape]   Review of Systems Review of Systems  All other systems reviewed and are negative.    Physical Exam Updated Vital Signs BP (!) 138/98   Pulse 79   Temp 98.2 F (36.8 C) (Oral)   Resp 16   Ht 5\' 5"  (1.651 m)   Wt 56.7 kg   SpO2 98%   BMI 20.80 kg/m   Physical Exam Vitals signs and nursing note reviewed.  Constitutional:      General: She is not in acute distress.    Appearance: She is well-developed.  HENT:     Head: Normocephalic and atraumatic.  Eyes:     Conjunctiva/sclera: Conjunctivae normal.  Cardiovascular:     Rate and Rhythm: Normal rate and regular rhythm.  Pulmonary:     Effort: Pulmonary effort is normal.     Breath sounds: Normal breath sounds.  Abdominal:     Palpations: Abdomen is soft.     Tenderness: There is no abdominal tenderness. There is no guarding.  Musculoskeletal:     Comments: Right leg with shortening and some slight internal rotation.  There  is pain with any slight movement of the right hip.  Intact distal pulses and normal distal sensation to the lower extremities.  Equal dorsi and plantar flexion bilaterally.  Knee is nontender, without  swelling or deformity.  Skin:    General: Skin is warm.  Neurological:     Mental Status: She is alert.  Psychiatric:        Behavior: Behavior normal.      ED Treatments / Results  Labs (all labs ordered are listed, but only abnormal results are displayed) Labs Reviewed  CBC - Abnormal; Notable for the following components:      Result Value   MCV 104.1 (*)    All other components within normal limits  BASIC METABOLIC PANEL - Abnormal; Notable for the following components:   Creatinine, Ser 1.07 (*)    GFR calc non Af Amer 52 (*)    All other components within normal limits  SARS CORONAVIRUS 2 (HOSPITAL ORDER, PERFORMED IN [facility])  PROTIME-INR    EKG EKG Interpretation  Date/Time:  Thursday May 31 2019 15:35:15 EDT Ventricular Rate:  78 PR Interval:    QRS Duration: 80 QT Interval:  387 QTC Calculation: 441 R Axis:   -28 Text Interpretation:  Sinus rhythm Borderline left axis deviation No STEMI  Confirmed by Alona Bene [phone]) on 05/31/2019 3:48:36 PM   Radiology Dg Hip Unilat With Pelvis 2-3 Views Right  Result Date: 05/31/2019 CLINICAL DATA:  Rt hip pain with deformity s/p fall x 5 days ago. No old injury known. EXAM: DG HIP (WITH OR WITHOUT PELVIS) 2-3V RIGHT COMPARISON:  CT of the LEFT hip on 05/10/2019, portable pelvis on 08/21/2014 FINDINGS: There is an acute intertrochanteric fracture of the RIGHT hip, associated varus angulation and impaction at the fracture site. No evidence for dislocation. Degenerative changes are seen in the LOWER spine. IMPRESSION: Acute intertrochanteric fracture of the RIGHT hip. Electronically Signed   By: Norva Pavlov M.D.   On: 05/31/2019 14:09    Procedures Procedures (including critical care time)  Medications  Ordered in ED Medications  fentaNYL (SUBLIMAZE) injection 50 mcg (has no administration in time range)  fentaNYL (SUBLIMAZE) injection 50 mcg (50 mcg Intravenous Given 05/31/19 1454)     Initial Impression / Assessment and Plan / ED Course  I have reviewed the triage vital signs and the nursing notes.  Pertinent labs & imaging results that were available during my care of the patient were reviewed by me and considered in my medical decision making (see chart for details).  Clinical Course as of May 31 1707  Thu May 31, 2019  1445 Dr. Roda Shutters with orthopedics consulted.  Requesting patient transfer to [facility] with medical admission, OR tomorrow.   [JR]  1542 Dr. Shon Baton now accepting for patient, per patient request. [facility], NPO midnight, alert him once pt arrives to [facility]. Will call back hospitalist and notify of change in location. Will also update dr. Roda Shutters    [JR]  [phone] Updated pt's husband personally in the ED. Aware and agreeable. Requests he is called by ortho doc prior to surgery tomorrow to discuss plan.   [JR]    Clinical Course User Index [JR] , [place] N, PA-C       Patient presenting with right hip pain and deformity after mechanical fall on Saturday.  Neurovascularly intact.  X-ray with acute impacted and angulated intertrochanteric fracture of the right hip.  On-call orthopedic initially consulted, Dr. Roda Shutters and patient admitted to [facility] for OR in the morning, however patient then requested orthopedist at [facility] because her husband is seen there.  Consult placed to Ortho, spoke with Dr. Shon Baton, who is accepting.  He states he is unsure which provider will [facility], however recommends n.p.o. at midnight tonight.  Requests he is notified once [facility] arrives [facility].  Patient's husband updated and agreeable to plan.  Stable for transfer.  The patient appears reasonably stabilized for admission considering the current  resources, flow, and capabilities available in the ED at this time, and I doubt any other [facility] requiring further screening and/or treatment in the ED prior to admission.  Final Clinical Impressions(s) / ED Diagnoses   Final diagnoses:  Closed nondisplaced intertrochanteric fracture of right femur, initial encounter [facility](HCC)    ED Discharge Orders    None       , [place] N, PA-C 05/31/19 1708    Sabas SousBero, Michael M, MD 06/07/19 [phone]

## 2019-05-31 NOTE — H&P (Signed)
History and Physical    [name] ZOX:[id number] DOB: 05-15-1946 DOA: 05/31/2019  PCP: Ann Held, DO  Patient coming from: [place]  I have personally briefly reviewed patient's old medical records in [place]  Chief Complaint: Right hip pain  HPI: [name] is a 73 y.o. female with medical history significant for depression with anxiety, left ureteral stricture with poorly functioning left kidney status post left nephrectomy, history of gastric bypass, and chronic back pain who presented to [place] ED for evaluation of right hip pain.  Patient states on 05/26/2019 she was getting up from a toilet when she lost her balance.  She tried to sit back down but missed and landed to the left side of the toilet landing on the floor on her right buttocks.  She has had persistent pain and difficulty with ambulation since then.  Patient states she initially felt this was a deep bruise therefore did not seek immediate medical attention.  She has had persistent pain and has been unable to bear weight therefore presented to the ED for further evaluation.  Patient states she normally ambulates with the use of a cane.  She denies any associated chest pain, palpitations, dyspnea, lightheadedness, dizziness, abdominal pain, dysuria, or loss of consciousness.  Patient recently had a left elbow intra-articular supracondylar humerus fracture status post ORIF with olecranon osteotomy and ulnar nerve release on 04/17/2019.  She has a left arm brace in place and states she has been recovering well.  [facility] ED Course:  Initial vitals showed BP 141/82, pulse 90, RR 18, temp 98.2 Fahrenheit, SPO2 95% on room air.  Labs are notable for WBC 5.7, hemoglobin 12.4, platelets 242,000, potassium 4.5, bicarb 24, BUN 21, creatinine 1.07.  SARS-CoV-2 test is negative.  Right hip x-ray showed an acute intertrochanteric fracture of the right hip.  Patient was  given fentanyl for pain.  Orthopedics, [facility] were consulted by the EDP and recommended transfer to [facility] for possible repair on 06/01/2019.  Review of Systems: All systems reviewed and are negative except as documented in history of present illness above.   Past Medical History:  Diagnosis Date  . Abrasion of right heel    during admission 10/ 2019 right heel open skin due to movement on sheet  . ADD (attention deficit disorder)   . Anemia, mild   . Anxiety   . Arthritis    "joints, back"  . At high risk for falls    10-19-2018 pt has fell twice in past week, tripped  . Chronic back pain    "all over my back" (10/02/2013)  . Crushing injury of arm, right 02/06/1989's   "it was crushed; wore cast from fingers to top of my shoulder" (11/25/2  . Crushing injury of left wrist 05/11/1989's  . DOE (dyspnea on exertion)   . Essential tremor   . Fibromyalgia   . History of palpitations 2002   recurrent  . History of panic attacks   . History of pneumonia 08/21/2018   w/ acute respiratory failure/ hypoxia  . History of sepsis    admission 08-21-2018  secondary to uropathy obstructive from ureteral stone   . Hyperlipidemia   . Hypertension    10-19-2018 PER PT AMLODIPINE ON HOLD SINCE 10/ 2019 DUE TO KIDNEY ISSUES PER DOCTOR  . Idiopathic scoliosis 04/19/2013  . Left ureteral stone   . Migraines    "once/month" (10/02/2013)  .  Osteoporosis   . Other vitamin B12 deficiency anemia   . PONV (postoperative nausea and vomiting)    As a child  . Pulmonary nodules/lesions, multiple   . Renal insufficiency   . S/P gastric bypass 09/16/2003  . S/P insertion of spinal cord stimulator    per pt remote is missing  . Wears dentures    upper  . Wears glasses   . Wears glasses     Past Surgical History:  Procedure Laterality Date  . ABDOMINAL HYSTERECTOMY  1980's   W/  BSO  AND APPENDECTOMY  . CARDIAC CATHETERIZATION  12-13-2002    dr Eden Emmsnishan   normal coronaries  .  CYSTOSCOPY WITH URETEROSCOPY AND STENT PLACEMENT Left 09/27/2018   Procedure: LEFT  URETEROSCOPY, HOLMIUM LASER LITHOTRIPSY;  Surgeon: Crist FatHerrick, Benjamin W, MD;  Location: [facility];  Service: Urology;  Laterality: Left;  . D & C HYSTERSCOPY RESECTION MYOMECTOMY  1980s  . HUMERUS FRACTURE SURGERY Left 04/17/2019    Comminuted complex left intra-articular distal humerus fracture/supracondylar humerus fracture displaced  . IR NEPHROSTOMY PLACEMENT LEFT  08/22/2018  . IR NEPHROSTOMY PLACEMENT LEFT  12/08/2018  . KNEE ARTHROSCOPY Right 03-31-2001    dr Simonne Comeaplington @[facility]   . NEPHROLITHOTOMY Left 10/20/2018   Procedure: LEFT NEPHROLITHOTOMY PERCUTANEOUS;  Surgeon: Crist FatHerrick, Benjamin W, MD;  Location: [facility];  Service: Urology;  Laterality: Left;  . ORIF HUMERUS FRACTURE Left 04/17/2019   Procedure: Open reduction internal fixation left supracondylar humerus fracture with olecranon osteotomy and ulnar nerve release and anterior transposition and repair of structures as necessary;  Surgeon: Dominica SeverinGramig, William, MD;  Location: [facility] OR;  Service: Orthopedics;  Laterality: Left;  3 hrs  . REDUCTION MAMMAPLASTY Bilateral 2001  . ROBOT ASSISTED LAPAROSCOPIC NEPHRECTOMY Left 12/25/2018   Procedure: XI ROBOTIC ASSISTED LAPAROSCOPIC NEPHRECTOMY, LEFT FLANK EXPLORATION WITH REMOVAL OF BATTERY PACK;  Surgeon: Crist FatHerrick, Benjamin W, MD;  Location: [facility];  Service: Urology;  Laterality: Left;  . ROUX-EN-Y GASTRIC BYPASS  09-16-2003   dr Judie Petitm. Daphine Deutschermartin  @[facility]    via Laparoscopy w/ gastrojejunostomy  . SPINAL CORD STIMULATOR IMPLANT  2016   10-19-2018  PER PT HAS REMOTE BUT HAS NOT USED IT SINCE DEC 2018  . TONSILLECTOMY AND ADENOIDECTOMY  1955  . TUBAL LIGATION  1980's    Social History:  reports that she has never smoked. She has never used smokeless tobacco. She reports that she does not drink alcohol or use drugs.  Allergies  Allergen Reactions  . Adhesive [Tape] Itching    Family History  Adopted: Yes  Problem Relation Age of  Onset  . COPD Mother   . Emphysema Mother   . Heart attack Mother   . Other Mother        tobacco abuse     Prior to Admission medications   Medication Sig Start Date End Date Taking? Authorizing Provider  acetaminophen (TYLENOL) 500 MG tablet Take 2,000 mg by mouth 3 (three) times daily as needed for moderate pain.    [provider]  amitriptyline (ELAVIL) 75 MG tablet Take 75 mg by mouth at bedtime.    [provider]  amphetamine-dextroamphetamine (ADDERALL XR) 20 MG 24 hr capsule TAKE TWO CAPSULE BY MOUTH EVERY MORNING 05/08/19   Zola ButtonLowne Chase, Grayling CongressYvonne R, DO  clonazePAM (KLONOPIN) 0.5 MG tablet 1/2 po bid prn Patient taking differently: Take 0.125 mg by mouth 2 (two) times daily as needed for anxiety.  03/22/19   Seabron SpatesLowne Chase, [name], DO  furosemide (LASIX) 40 MG  tablet Take 1 tablet (40 mg total) by mouth daily as needed for fluid or edema. 01/02/19   Richarda OverlieAbrol, Nayana, MD  neomycin-bacitracin-polymyxin (NEOSPORIN) ointment Apply 1 application topically daily as needed for wound care.    [provider]  oxyCODONE (ROXICODONE) 15 MG immediate release tablet Take 1 tablet (15 mg total) by mouth 4 (four) times daily. 04/19/19   Dominica SeverinGramig, William, MD  SUMAtriptan (IMITREX) 50 MG tablet Take 1 tablet (50 mg total) by mouth every 2 (two) hours as needed for migraine or headache. Max 2 doses in 24 hours 04/25/19   Zola ButtonLowne Chase, Grayling CongressYvonne R, DO  venlafaxine XR (EFFEXOR-XR) 75 MG 24 hr capsule Take 2 capsules (150 mg total) by mouth daily. 10/24/18   Donato SchultzLowne Chase, [name], DO    Physical Exam: Vitals:   05/31/19 1317 05/31/19 1321 05/31/19 1658 05/31/19 1829  BP:  (!) 141/82 (!) 138/98 139/85  Pulse:  90 79 87  Resp:  18 16 16   Temp:  98.2 F (36.8 C)  98.3 F (36.8 C)  TempSrc:  Oral Oral Oral  SpO2:  95% 98% 99%  Weight: 56.7 kg     Height: 5\' 5"  (1.651 m)       Constitutional: Elderly woman resting supine in bed, NAD, calm, comfortable Eyes: PERRL, lids and  conjunctivae normal ENMT: Mucous membranes are moist. Posterior pharynx clear of any exudate or lesions.Normal dentition.  Neck: normal, supple, no masses. Respiratory: clear to auscultation bilaterally, no wheezing, no crackles. Normal respiratory effort. No accessory muscle use.  Cardiovascular: Regular rate and rhythm, no murmurs / rubs / gallops. No extremity edema. 2+ pedal pulses. Abdomen: no tenderness, no masses palpated. No hepatosplenomegaly. Bowel sounds positive.  Musculoskeletal: LUE with brace in place.  No clubbing / cyanosis.  ROM of RLE diminished due to right hip fracture. Skin: no rashes, lesions, ulcers. No induration Neurologic: CN 2-12 grossly intact. Sensation intact, strength diminished RLE due to right hip fracture otherwise strength 5/5 in other extremities. Psychiatric: Normal judgment and insight. Alert and oriented x 3. Normal mood.     Labs on Admission: I have personally reviewed following labs and imaging studies  CBC: Recent Labs  Lab 05/31/19 1442  WBC 5.7  HGB 12.4  HCT 40.3  MCV 104.1*  PLT 242   Basic Metabolic Panel: Recent Labs  Lab 05/31/19 1442  NA 138  K 4.5  CL 105  CO2 24  GLUCOSE 93  BUN 21  CREATININE 1.07*  CALCIUM 9.0   GFR: Estimated Creatinine Clearance: 43.2 mL/min (A) (by C-G formula based on SCr of 1.07 mg/dL (H)). Liver Function Tests: No results for input(s): AST, ALT, ALKPHOS, BILITOT, PROT, ALBUMIN in the last 168 hours. No results for input(s): LIPASE, AMYLASE in the last 168 hours. No results for input(s): AMMONIA in the last 168 hours. Coagulation Profile: Recent Labs  Lab 05/31/19 1507  INR 1.0   Cardiac Enzymes: No results for input(s): CKTOTAL, CKMB, CKMBINDEX, TROPONINI in the last 168 hours. BNP (last 3 results) No results for input(s): PROBNP in the last 8760 hours. HbA1C: No results for input(s): HGBA1C in the last 72 hours. CBG: No results for input(s): GLUCAP in the last 168 hours. Lipid  Profile: No results for input(s): CHOL, HDL, LDLCALC, TRIG, CHOLHDL, LDLDIRECT in the last 72 hours. Thyroid Function Tests: No results for input(s): TSH, T4TOTAL, FREET4, T3FREE, THYROIDAB in the last 72 hours. Anemia Panel: No results for input(s): VITAMINB12, FOLATE, FERRITIN, TIBC, IRON, RETICCTPCT in the  last 72 hours. Urine analysis:    Component Value Date/Time   COLORURINE YELLOW 12/07/2018 1908   APPEARANCEUR HAZY (A) 12/07/2018 1908   LABSPEC 1.024 12/07/2018 1908   PHURINE 5.0 12/07/2018 1908   GLUCOSEU NEGATIVE 12/07/2018 1908   HGBUR NEGATIVE 12/07/2018 1908   HGBUR negative 10/28/2009 0000   BILIRUBINUR NEGATIVE 12/07/2018 1908   BILIRUBINUR negative 03/24/2015 1615   KETONESUR NEGATIVE 12/07/2018 1908   PROTEINUR 100 (A) 12/07/2018 1908   UROBILINOGEN 2.0 03/24/2015 1615   UROBILINOGEN 1.0 03/06/2015 2030   NITRITE NEGATIVE 12/07/2018 1908   LEUKOCYTESUR MODERATE (A) 12/07/2018 1908    Radiological Exams on Admission: Dg Hip Unilat With Pelvis 2-3 Views Right  Result Date: 05/31/2019 CLINICAL DATA:  Rt hip pain with deformity s/p fall x 5 days ago. No old injury known. EXAM: DG HIP (WITH OR WITHOUT PELVIS) 2-3V RIGHT COMPARISON:  CT of the LEFT hip on 05/10/2019, portable pelvis on 08/21/2014 FINDINGS: There is an acute intertrochanteric fracture of the RIGHT hip, associated varus angulation and impaction at the fracture site. No evidence for dislocation. Degenerative changes are seen in the LOWER spine. IMPRESSION: Acute intertrochanteric fracture of the RIGHT hip. Electronically Signed   By: Norva PavlovElizabeth  Brown M.D.   On: 05/31/2019 14:09    EKG: Independently reviewed. Sinus rhythm without acute ischemic changes.  Motion artifact present.  PVCs present on prior EKG otherwise no significant changes.  Assessment/Plan Principal Problem:   Closed nondisplaced intertrochanteric fracture of right femur (HCC) Active Problems:   DEPRESSION/ANXIETY  Burnard HawthorneRita C Hefley is a 73  y.o. female with medical history significant for depression with anxiety, left ureteral stricture with poorly functioning left kidney status post left nephrectomy, history of gastric bypass, and chronic back pain who who is admitted with an acute intertrochanteric fracture of the right hip.   Acute intertrochanteric fracture of the right hip: Occurred after a mechanical fall on 05/26/2019.  Patient has a 3.9% preoperative 30-day risk for death, MI, or cardiac arrest based on the revised cardiac risk index.  Discussed with orthopedics, Dr. Shon BatonBrooks, who advised that patient will likely undergo ORIF by Dr. Lequita HaltAluisio on 06/01/2019. -Orthopedics consulted, plan for OR 06/01/2019 -Continue pain control with home OxyIR as needed and IV morphine as needed -Keep n.p.o. at midnight  Depression with anxiety: -Continue home venlafaxine XR and as needed clonazepam  Chronic back pain: -Continue home OxyIR 15 mg 4 times daily as needed and amitriptyline  ADD: -Continue home Adderall XR  DVT prophylaxis: SCDs Code Status: Full code, confirmed with patient Family Communication: None present on admission Disposition Plan: Pending surgical intervention and postop recovery Consults called: Orthopedics Admission status: Inpatient for management of acute right hip fracture.   Darreld McleanVishal Lorelle Macaluso MD [facility]  If 7PM-7AM, please contact night-coverage [email or web address]  05/31/2019, 7:01 PM

## 2019-05-31 NOTE — Procedures (Addendum)
Patient presents with mechanical fall, sustained hip fracture, ED discussed with Dr.XU initially, who accepted patient to [facility], but family requested Dr. Rolena Infante to perform surgery, he discussed with him, he is agreeable, so patient will be admitted to [facility] in anticipation for surgery tomorrow Dr. Rolena Infante at [place] [facility] , request change from [facility] to [facility] . -Patient is admitted to medical bed, with requested entered Phillips Climes MD

## 2019-05-31 NOTE — ED Triage Notes (Signed)
Per EMS:  Came from home and fell this am in bathroom, fell off toilet,  C/o pain to right hip.  No deformity or shortening.  Pt states OTC meds not workng.

## 2019-05-31 NOTE — ED Triage Notes (Signed)
Pt states she sat down incorrectly on toilet.  Pt got stuck between toilet and wall. Pt states she is having pain walking and otc meds are not helping.

## 2019-05-31 NOTE — ED Notes (Signed)
Called  [facility] spoke with Doug 

## 2019-05-31 NOTE — Progress Notes (Signed)
Dr.Patel aware of patients admission. Awaiting admission orders at this time.

## 2019-06-01 ENCOUNTER — Encounter (HOSPITAL_COMMUNITY): Payer: Self-pay | Admitting: General Practice

## 2019-06-01 ENCOUNTER — Inpatient Hospital Stay (HOSPITAL_COMMUNITY): Payer: Medicare Other | Admitting: Registered Nurse

## 2019-06-01 ENCOUNTER — Encounter (HOSPITAL_COMMUNITY)
Admission: EM | Disposition: A | Discharge status: Home Health | Payer: Self-pay | Source: Home / Self Care | Attending: Internal Medicine

## 2019-06-01 ENCOUNTER — Inpatient Hospital Stay (HOSPITAL_COMMUNITY): Payer: Medicare Other

## 2019-06-01 DIAGNOSIS — F341 Dysthymic disorder: Secondary | ICD-10-CM

## 2019-06-01 DIAGNOSIS — F988 Other specified behavioral and emotional disorders with onset usually occurring in childhood and adolescence: Secondary | ICD-10-CM

## 2019-06-01 HISTORY — PX: INTRAMEDULLARY (IM) NAIL INTERTROCHANTERIC: SHX5875

## 2019-06-01 LAB — CBC
HCT: 41.7 % (ref 36.0–46.0)
Hemoglobin: 12.7 g/dL (ref 12.0–15.0)
MCH: 31.8 pg (ref 26.0–34.0)
MCHC: 30.5 g/dL (ref 30.0–36.0)
MCV: 104.5 fL — ABNORMAL HIGH (ref 80.0–100.0)
Platelets: 190 10*3/uL (ref 150–400)
RBC: 3.99 MIL/uL (ref 3.87–5.11)
RDW: 13.2 % (ref 11.5–15.5)
WBC: 5.4 10*3/uL (ref 4.0–10.5)
nRBC: 0 % (ref 0.0–0.2)

## 2019-06-01 LAB — BASIC METABOLIC PANEL
Anion gap: 10 (ref 5–15)
BUN: 20 mg/dL (ref 8–23)
CO2: 24 mmol/L (ref 22–32)
Calcium: 8.9 mg/dL (ref 8.9–10.3)
Chloride: 102 mmol/L (ref 98–111)
Creatinine, Ser: 0.99 mg/dL (ref 0.44–1.00)
GFR calc Af Amer: >60 mL/min (ref >60)
GFR calc non Af Amer: 57 mL/min — ABNORMAL LOW (ref >60)
Glucose, Bld: 85 mg/dL (ref 70–99)
Potassium: 4.6 mmol/L (ref 3.5–5.1)
Sodium: 136 mmol/L (ref 135–145)

## 2019-06-01 LAB — SURGICAL PCR SCREEN
MRSA, PCR: NEGATIVE
Staphylococcus aureus: NEGATIVE

## 2019-06-01 SURGERY — FIXATION, FRACTURE, INTERTROCHANTERIC, WITH INTRAMEDULLARY ROD
Anesthesia: General | Laterality: Right

## 2019-06-01 SURGERY — FIXATION, FRACTURE, INTERTROCHANTERIC, WITH INTRAMEDULLARY ROD
Anesthesia: General | Site: Hip | Laterality: Right

## 2019-06-01 MED ORDER — METHOCARBAMOL 500 MG IVPB - SIMPLE MED
INTRAVENOUS | Status: AC
  Administered 2019-06-01: 500 mg via INTRAVENOUS
  Filled 2019-06-01: qty 50

## 2019-06-01 MED ORDER — PHENYLEPHRINE 40 MCG/ML (10ML) SYRINGE FOR IV PUSH (FOR BLOOD PRESSURE SUPPORT)
PREFILLED_SYRINGE | INTRAVENOUS | Status: AC
  Filled 2019-06-01: qty 10

## 2019-06-01 MED ORDER — CEFAZOLIN SODIUM-DEXTROSE 2-4 GM/100ML-% IV SOLN
2.0000 g | Freq: Once | INTRAVENOUS | Status: AC
  Administered 2019-06-01: 2 g via INTRAVENOUS
  Filled 2019-06-01: qty 100

## 2019-06-01 MED ORDER — PROPOFOL 10 MG/ML IV BOLUS
INTRAVENOUS | Status: DC | PRN
  Administered 2019-06-01: 120 mg via INTRAVENOUS

## 2019-06-01 MED ORDER — METHOCARBAMOL 500 MG PO TABS
500.0000 mg | ORAL_TABLET | Freq: Four times a day (QID) | ORAL | Status: DC | PRN
  Administered 2019-06-02 – 2019-06-04 (×5): 500 mg via ORAL
  Filled 2019-06-01 (×5): qty 1

## 2019-06-01 MED ORDER — METHOCARBAMOL 500 MG IVPB - SIMPLE MED
500.0000 mg | Freq: Four times a day (QID) | INTRAVENOUS | Status: DC | PRN
  Administered 2019-06-01: 500 mg via INTRAVENOUS
  Filled 2019-06-01: qty 50

## 2019-06-01 MED ORDER — DEXAMETHASONE SODIUM PHOSPHATE 10 MG/ML IJ SOLN
INTRAMUSCULAR | Status: AC
  Filled 2019-06-01: qty 1

## 2019-06-01 MED ORDER — ROCURONIUM BROMIDE 10 MG/ML (PF) SYRINGE
PREFILLED_SYRINGE | INTRAVENOUS | Status: DC | PRN
  Administered 2019-06-01: 30 mg via INTRAVENOUS

## 2019-06-01 MED ORDER — METOCLOPRAMIDE HCL 5 MG PO TABS: 5.0000 mg | ORAL_TABLET | Freq: Three times a day (TID) | ORAL | Status: DC | PRN

## 2019-06-01 MED ORDER — ROCURONIUM BROMIDE 10 MG/ML (PF) SYRINGE
PREFILLED_SYRINGE | INTRAVENOUS | Status: AC
  Filled 2019-06-01: qty 10

## 2019-06-01 MED ORDER — DEXAMETHASONE SODIUM PHOSPHATE 10 MG/ML IJ SOLN
INTRAMUSCULAR | Status: DC | PRN
  Administered 2019-06-01: 5 mg via INTRAVENOUS

## 2019-06-01 MED ORDER — FUROSEMIDE 40 MG PO TABS: 40.0000 mg | ORAL_TABLET | Freq: Every day | ORAL | Status: DC | PRN

## 2019-06-01 MED ORDER — ONDANSETRON HCL 4 MG/2ML IJ SOLN
INTRAMUSCULAR | Status: DC | PRN
  Administered 2019-06-01: 4 mg via INTRAVENOUS

## 2019-06-01 MED ORDER — SUCCINYLCHOLINE CHLORIDE 200 MG/10ML IV SOSY
PREFILLED_SYRINGE | INTRAVENOUS | Status: DC | PRN
  Administered 2019-06-01: 100 mg via INTRAVENOUS
  Administered 2019-06-01: 30 mg via INTRAVENOUS

## 2019-06-01 MED ORDER — SUGAMMADEX SODIUM 500 MG/5ML IV SOLN
INTRAVENOUS | Status: AC
  Filled 2019-06-01: qty 5

## 2019-06-01 MED ORDER — LABETALOL HCL 5 MG/ML IV SOLN
INTRAVENOUS | Status: AC
  Filled 2019-06-01: qty 4

## 2019-06-01 MED ORDER — 0.9 % SODIUM CHLORIDE (POUR BTL) OPTIME
TOPICAL | Status: DC | PRN
  Administered 2019-06-01: 1000 mL

## 2019-06-01 MED ORDER — OXYCODONE HCL 5 MG PO TABS
10.0000 mg | ORAL_TABLET | ORAL | Status: DC | PRN
  Administered 2019-06-01: 10 mg via ORAL
  Administered 2019-06-01: 15 mg via ORAL
  Administered 2019-06-02: 10 mg via ORAL
  Administered 2019-06-02: 15 mg via ORAL
  Administered 2019-06-02: 10 mg via ORAL
  Administered 2019-06-02 (×2): 15 mg via ORAL
  Administered 2019-06-03 (×3): 10 mg via ORAL
  Administered 2019-06-04 (×2): 15 mg via ORAL
  Administered 2019-06-04: 10 mg via ORAL
  Filled 2019-06-01 (×4): qty 3
  Filled 2019-06-01: qty 2
  Filled 2019-06-01: qty 3
  Filled 2019-06-01 (×2): qty 2
  Filled 2019-06-01 (×2): qty 3
  Filled 2019-06-01 (×2): qty 2
  Filled 2019-06-01: qty 3

## 2019-06-01 MED ORDER — KETAMINE HCL 10 MG/ML IJ SOLN
INTRAMUSCULAR | Status: AC
  Filled 2019-06-01: qty 1

## 2019-06-01 MED ORDER — LIDOCAINE 2% (20 MG/ML) 5 ML SYRINGE
INTRAMUSCULAR | Status: AC
  Filled 2019-06-01: qty 5

## 2019-06-01 MED ORDER — MIDAZOLAM HCL 2 MG/2ML IJ SOLN
INTRAMUSCULAR | Status: AC
  Filled 2019-06-01: qty 2

## 2019-06-01 MED ORDER — FENTANYL CITRATE (PF) 250 MCG/5ML IJ SOLN
INTRAMUSCULAR | Status: AC
  Filled 2019-06-01: qty 5

## 2019-06-01 MED ORDER — FENTANYL CITRATE (PF) 100 MCG/2ML IJ SOLN
25.0000 ug | INTRAMUSCULAR | Status: DC | PRN
  Administered 2019-06-01 (×2): 50 ug via INTRAVENOUS

## 2019-06-01 MED ORDER — LIDOCAINE 2% (20 MG/ML) 5 ML SYRINGE
INTRAMUSCULAR | Status: DC | PRN
  Administered 2019-06-01: 50 mg via INTRAVENOUS

## 2019-06-01 MED ORDER — MIDAZOLAM HCL 5 MG/5ML IJ SOLN
INTRAMUSCULAR | Status: DC | PRN
  Administered 2019-06-01: 1 mg via INTRAVENOUS

## 2019-06-01 MED ORDER — PROPOFOL 10 MG/ML IV BOLUS
INTRAVENOUS | Status: AC
  Filled 2019-06-01: qty 20

## 2019-06-01 MED ORDER — PHENYLEPHRINE HCL (PRESSORS) 10 MG/ML IV SOLN
INTRAVENOUS | Status: AC
  Filled 2019-06-01: qty 1

## 2019-06-01 MED ORDER — METOCLOPRAMIDE HCL 5 MG/ML IJ SOLN: 5.0000 mg | Freq: Three times a day (TID) | INTRAMUSCULAR | Status: DC | PRN

## 2019-06-01 MED ORDER — FENTANYL CITRATE (PF) 100 MCG/2ML IJ SOLN
INTRAMUSCULAR | Status: AC
  Filled 2019-06-01: qty 2

## 2019-06-01 MED ORDER — FENTANYL CITRATE (PF) 100 MCG/2ML IJ SOLN
INTRAMUSCULAR | Status: DC | PRN
  Administered 2019-06-01 (×2): 100 ug via INTRAVENOUS
  Administered 2019-06-01 (×4): 50 ug via INTRAVENOUS
  Administered 2019-06-01: 100 ug via INTRAVENOUS
  Administered 2019-06-01 (×2): 50 ug via INTRAVENOUS

## 2019-06-01 MED ORDER — CEFAZOLIN SODIUM-DEXTROSE 2-4 GM/100ML-% IV SOLN
2.0000 g | Freq: Four times a day (QID) | INTRAVENOUS | Status: AC
  Administered 2019-06-01 – 2019-06-02 (×2): 2 g via INTRAVENOUS
  Filled 2019-06-01 (×2): qty 100

## 2019-06-01 MED ORDER — LACTATED RINGERS IV SOLN: INTRAVENOUS | Status: DC

## 2019-06-01 MED ORDER — PHENOL 1.4 % MT LIQD: 1.0000 | OROMUCOSAL | Status: DC | PRN

## 2019-06-01 MED ORDER — SUMATRIPTAN SUCCINATE 50 MG PO TABS
50.0000 mg | ORAL_TABLET | ORAL | Status: DC | PRN
  Filled 2019-06-01: qty 1

## 2019-06-01 MED ORDER — KETAMINE HCL 10 MG/ML IJ SOLN
INTRAMUSCULAR | Status: DC | PRN
  Administered 2019-06-01: 10 mg via INTRAVENOUS
  Administered 2019-06-01: 20 mg via INTRAVENOUS

## 2019-06-01 MED ORDER — ACETAMINOPHEN 500 MG PO TABS
1000.0000 mg | ORAL_TABLET | Freq: Once | ORAL | Status: AC
  Administered 2019-06-01: 1000 mg via ORAL
  Filled 2019-06-01: qty 2

## 2019-06-01 MED ORDER — ONDANSETRON HCL 4 MG PO TABS: 4.0000 mg | ORAL_TABLET | Freq: Four times a day (QID) | ORAL | Status: DC | PRN

## 2019-06-01 MED ORDER — DOCUSATE SODIUM 100 MG PO CAPS
100.0000 mg | ORAL_CAPSULE | Freq: Two times a day (BID) | ORAL | Status: DC
  Administered 2019-06-01 – 2019-06-04 (×6): 100 mg via ORAL
  Filled 2019-06-01 (×5): qty 1

## 2019-06-01 MED ORDER — SUCCINYLCHOLINE CHLORIDE 200 MG/10ML IV SOSY
PREFILLED_SYRINGE | INTRAVENOUS | Status: AC
  Filled 2019-06-01: qty 10

## 2019-06-01 MED ORDER — ONDANSETRON HCL 4 MG/2ML IJ SOLN
INTRAMUSCULAR | Status: AC
  Filled 2019-06-01: qty 2

## 2019-06-01 MED ORDER — ONDANSETRON HCL 4 MG/2ML IJ SOLN: 4.0000 mg | Freq: Four times a day (QID) | INTRAMUSCULAR | Status: DC | PRN

## 2019-06-01 MED ORDER — LACTATED RINGERS IV SOLN
INTRAVENOUS | Status: DC | PRN
  Administered 2019-06-01 (×2): via INTRAVENOUS

## 2019-06-01 MED ORDER — CLONAZEPAM 0.5 MG PO TABS
0.2500 mg | ORAL_TABLET | Freq: Two times a day (BID) | ORAL | Status: DC | PRN
  Administered 2019-06-02 – 2019-06-03 (×3): 0.5 mg via ORAL
  Filled 2019-06-01 (×3): qty 1

## 2019-06-01 MED ORDER — SUGAMMADEX SODIUM 200 MG/2ML IV SOLN
INTRAVENOUS | Status: DC | PRN
  Administered 2019-06-01: 100 mg via INTRAVENOUS

## 2019-06-01 MED ORDER — LABETALOL HCL 5 MG/ML IV SOLN
INTRAVENOUS | Status: DC | PRN
  Administered 2019-06-01 (×2): 2.5 mg via INTRAVENOUS

## 2019-06-01 MED ORDER — ASPIRIN EC 325 MG PO TBEC
325.0000 mg | DELAYED_RELEASE_TABLET | Freq: Every day | ORAL | Status: DC
  Administered 2019-06-02 – 2019-06-04 (×3): 325 mg via ORAL
  Filled 2019-06-01 (×3): qty 1

## 2019-06-01 MED ORDER — OXYCODONE HCL 5 MG PO TABS: 5.0000 mg | ORAL_TABLET | ORAL | Status: DC | PRN

## 2019-06-01 MED ORDER — LACTATED RINGERS IV SOLN
INTRAVENOUS | Status: DC
  Administered 2019-06-01: 16:00:00 via INTRAVENOUS

## 2019-06-01 MED ORDER — MENTHOL 3 MG MT LOZG: 1.0000 | LOZENGE | OROMUCOSAL | Status: DC | PRN

## 2019-06-01 MED ORDER — SODIUM CHLORIDE 0.9 % IV SOLN
INTRAVENOUS | Status: DC
  Administered 2019-06-01 – 2019-06-02 (×2): via INTRAVENOUS

## 2019-06-01 SURGICAL SUPPLY — 38 items
BAG ZIPLOCK 12X15 (MISCELLANEOUS) ×3 IMPLANT
BIT DRILL CANN LG 4.3MM (BIT) IMPLANT
BNDG GAUZE ELAST 4 BULKY (GAUZE/BANDAGES/DRESSINGS) ×3 IMPLANT
COVER PERINEAL POST (MISCELLANEOUS) ×3 IMPLANT
COVER SURGICAL LIGHT HANDLE (MISCELLANEOUS) ×3 IMPLANT
COVER WAND RF STERILE (DRAPES) IMPLANT
DRAPE INCISE IOBAN 66X45 STRL (DRAPES) ×3 IMPLANT
DRILL BIT CANN LG 4.3MM (BIT) ×3
DRSG MEPILEX BORDER 4X4 (GAUZE/BANDAGES/DRESSINGS) ×6 IMPLANT
DRSG MEPILEX BORDER 4X8 (GAUZE/BANDAGES/DRESSINGS) ×3 IMPLANT
DURAPREP 26ML APPLICATOR (WOUND CARE) ×3 IMPLANT
ELECT REM PT RETURN 15FT ADLT (MISCELLANEOUS) ×3 IMPLANT
FACESHIELD WRAPAROUND (MASK) ×9 IMPLANT
FACESHIELD WRAPAROUND OR TEAM (MASK) ×3 IMPLANT
GLOVE BIO SURGEON STRL SZ7 (GLOVE) ×3 IMPLANT
GLOVE BIO SURGEON STRL SZ8 (GLOVE) ×6 IMPLANT
GLOVE BIOGEL PI IND STRL 7.0 (GLOVE) ×1 IMPLANT
GLOVE BIOGEL PI IND STRL 8 (GLOVE) ×2 IMPLANT
GLOVE BIOGEL PI INDICATOR 7.0 (GLOVE) ×2
GLOVE BIOGEL PI INDICATOR 8 (GLOVE) ×4
GOWN STRL REUS W/TWL LRG LVL3 (GOWN DISPOSABLE) ×9 IMPLANT
GUIDEPIN 3.2X17.5 THRD DISP (PIN) ×2 IMPLANT
HFN 125 DEG 11MM X 180MM (Orthopedic Implant) ×2 IMPLANT
HIP FRA NAIL LAG SCREW 10.5X90 (Orthopedic Implant) ×3 IMPLANT
KIT BASIN OR (CUSTOM PROCEDURE TRAY) ×3 IMPLANT
KIT TURNOVER KIT A (KITS) IMPLANT
MANIFOLD NEPTUNE II (INSTRUMENTS) ×3 IMPLANT
NS IRRIG 1000ML POUR BTL (IV SOLUTION) ×3 IMPLANT
PACK GENERAL/GYN (CUSTOM PROCEDURE TRAY) ×3 IMPLANT
PROTECTOR NERVE ULNAR (MISCELLANEOUS) ×3 IMPLANT
SCREW BONE CORTICAL 5.0X36 (Screw) ×2 IMPLANT
SCREW LAG HIP FRA NAIL 10.5X90 (Orthopedic Implant) IMPLANT
STAPLER VISISTAT 35W (STAPLE) ×3 IMPLANT
SUT VIC AB 1 CT1 27 (SUTURE) ×2
SUT VIC AB 1 CT1 27XBRD ANTBC (SUTURE) ×1 IMPLANT
SUT VIC AB 2-0 CT1 27 (SUTURE) ×2
SUT VIC AB 2-0 CT1 TAPERPNT 27 (SUTURE) ×1 IMPLANT
TOWEL OR 17X26 10 PK STRL BLUE (TOWEL DISPOSABLE) ×3 IMPLANT

## 2019-06-01 NOTE — Consult Note (Signed)
Reason for Consult:Right intertrochanteric femur fracture Referring Physician: Dr. Margie Egeyrone Kyle  [name] is an 73 y.o. female.  HPI: [name] is a [age] yo female who had a mechanical fall in her bathroom yesterday. She was  Getting off of the commode and went to sit back down and slipped to the side of the commode hitting her right buttock and falling on the floor. She had immediate right hip pain. She did not have any prodromal symptoms leading to the fall. She did not hit her head or have any loss of consciousness. Her only complaint is the right hip pain. She is not having any lower extremity weakness or paresthesia. [facility] was [facility] to [facility] after being seen in the [facility] ED and was diagnosed with an intertrochanteric femur fracture. She was admitted by the hospitalist service and we are consulting for fracture management  Past Medical History:  Diagnosis Date  . Abrasion of right heel    during admission 10/ 2019 right heel open skin due to movement on sheet  . ADD (attention deficit disorder)   . Anemia, mild   . Anxiety   . Arthritis    "joints, back"  . At high risk for falls    10-19-2018 pt has fell twice in past week, tripped  . Chronic back pain    "all over my back" (10/02/2013)  . Crushing injury of arm, right 02/06/1989's   "it was crushed; wore cast from fingers to top of my shoulder" (11/25/2  . Crushing injury of left wrist 05/11/1989's  . DOE (dyspnea on exertion)   . Essential tremor   . Fibromyalgia   . History of palpitations 2002   recurrent  . History of panic attacks   . History of pneumonia 08/21/2018   w/ acute respiratory failure/ hypoxia  . History of sepsis    admission 08-21-2018  secondary to uropathy obstructive from ureteral stone   . Hyperlipidemia   . Hypertension    10-19-2018 PER PT AMLODIPINE ON HOLD SINCE 10/ 2019 DUE TO KIDNEY ISSUES PER DOCTOR  . Idiopathic scoliosis 04/19/2013  . Left ureteral stone   . Migraines    "once/month"  (10/02/2013)  . Osteoporosis   . Other vitamin B12 deficiency anemia   . PONV (postoperative nausea and vomiting)    As a child  . Pulmonary nodules/lesions, multiple   . Renal insufficiency   . S/P gastric bypass 09/16/2003  . S/P insertion of spinal cord stimulator    per pt remote is missing  . Wears dentures    upper  . Wears glasses   . Wears glasses     Past Surgical History:  Procedure Laterality Date  . ABDOMINAL HYSTERECTOMY  1980's   W/  BSO  AND APPENDECTOMY  . CARDIAC CATHETERIZATION  12-13-2002    dr Eden Emmsnishan   normal coronaries  . CYSTOSCOPY WITH URETEROSCOPY AND STENT PLACEMENT Left 09/27/2018   Procedure: LEFT  URETEROSCOPY, HOLMIUM LASER LITHOTRIPSY;  Surgeon: Crist FatHerrick, Benjamin W, MD;  Location: [facility];  Service: Urology;  Laterality: Left;  . D & C HYSTERSCOPY RESECTION MYOMECTOMY  1980s  . HUMERUS FRACTURE SURGERY Left 04/17/2019    Comminuted complex left intra-articular distal humerus fracture/supracondylar humerus fracture displaced  . IR NEPHROSTOMY PLACEMENT LEFT  08/22/2018  . IR NEPHROSTOMY PLACEMENT LEFT  12/08/2018  . KNEE ARTHROSCOPY Right 03-31-2001    dr Simonne Comeaplington @[facility]   . NEPHROLITHOTOMY Left 10/20/2018   Procedure: LEFT NEPHROLITHOTOMY PERCUTANEOUS;  Surgeon: Crist FatHerrick, Benjamin W,  MD;  Location: [facility];  Service: Urology;  Laterality: Left;  . ORIF HUMERUS FRACTURE Left 04/17/2019   Procedure: Open reduction internal fixation left supracondylar humerus fracture with olecranon osteotomy and ulnar nerve release and anterior transposition and repair of structures as necessary;  Surgeon: Dominica SeverinGramig, William, MD;  Location: [facility] OR;  Service: Orthopedics;  Laterality: Left;  3 hrs  . REDUCTION MAMMAPLASTY Bilateral 2001  . ROBOT ASSISTED LAPAROSCOPIC NEPHRECTOMY Left 12/25/2018   Procedure: XI ROBOTIC ASSISTED LAPAROSCOPIC NEPHRECTOMY, LEFT FLANK EXPLORATION WITH REMOVAL OF BATTERY PACK;  Surgeon: Crist FatHerrick, Benjamin W, MD;  Location: [facility];  Service: Urology;   Laterality: Left;  . ROUX-EN-Y GASTRIC BYPASS  09-16-2003   dr Judie Petitm. Daphine Deutschermartin  @[facility]    via Laparoscopy w/ gastrojejunostomy  . SPINAL CORD STIMULATOR IMPLANT  2016   10-19-2018  PER PT HAS REMOTE BUT HAS NOT USED IT SINCE DEC 2018  . TONSILLECTOMY AND ADENOIDECTOMY  1955  . TUBAL LIGATION  1980's    Family History  Adopted: Yes  Problem Relation Age of Onset  . COPD Mother   . Emphysema Mother   . Heart attack Mother   . Other Mother        tobacco abuse    Social History:  reports that she has never smoked. She has never used smokeless tobacco. She reports that she does not drink alcohol or use drugs.  Allergies:  Allergies  Allergen Reactions  . Adhesive [Tape] Itching    Medications: I have reviewed the patient's current medications.  Results for orders placed or performed during the hospital encounter of 05/31/19 (from the past 48 hour(s))  CBC     Status: Abnormal   Collection Time: 05/31/19  2:42 PM  Result Value Ref Range   WBC 5.7 4.0 - 10.5 K/uL   RBC 3.87 3.87 - 5.11 MIL/uL   Hemoglobin 12.4 12.0 - 15.0 g/dL   HCT 16.140.3 09.636.0 - 04.546.0 %   MCV 104.1 (H) 80.0 - 100.0 fL   MCH 32.0 26.0 - 34.0 pg   MCHC 30.8 30.0 - 36.0 g/dL   RDW 40.913.4 81.111.5 - 91.415.5 %   Platelets 242 150 - 400 K/uL   nRBC 0.0 0.0 - 0.2 %    Comment: Performed at [facility] [place], [address] [place], [place] [address]  Basic metabolic panel     Status: Abnormal   Collection Time: 05/31/19  2:42 PM  Result Value Ref Range   Sodium 138 135 - 145 mmol/L   Potassium 4.5 3.5 - 5.1 mmol/L   Chloride 105 98 - 111 mmol/L   CO2 24 22 - 32 mmol/L   Glucose, Bld 93 70 - 99 mg/dL   BUN 21 8 - 23 mg/dL   Creatinine, Ser 6.211.07 (H) 0.44 - 1.00 mg/dL   Calcium 9.0 8.9 - 30.810.3 mg/dL   GFR calc non Af Amer 52 (L) >60 mL/min   GFR calc Af Amer >60 >60 mL/min   Anion gap 9 5 - 15    Comment: Performed at [facility] [place], [address] [place], [place] [address]  SARS Coronavirus 2 (Performed in  [facility] hospital lab)     Status: None   Collection Time: 05/31/19  2:42 PM   Specimen: Nasopharyngeal Swab  Result Value Ref Range   SARS Coronavirus 2 NEGATIVE NEGATIVE    Comment: (NOTE) If result is NEGATIVE SARS-CoV-2 target nucleic acids are NOT DETECTED. The SARS-CoV-2 RNA is generally detectable  in upper and lower  respiratory specimens during the acute phase of infection. The lowest  concentration of SARS-CoV-2 viral copies this assay can detect is 250  copies / mL. A negative result does not preclude SARS-CoV-2 infection  and should not be used as the sole basis for treatment or other  patient management decisions.  A negative result may occur with  improper specimen collection / handling, submission of specimen other  than nasopharyngeal swab, presence of viral mutation(s) within the  areas targeted by this assay, and inadequate number of viral copies  (<250 copies / mL). A negative result must be combined with clinical  observations, patient history, and epidemiological information. If result is POSITIVE SARS-CoV-2 target nucleic acids are DETECTED. The SARS-CoV-2 RNA is generally detectable in upper and lower  respiratory specimens dur ing the acute phase of infection.  Positive  results are indicative of active infection with SARS-CoV-2.  Clinical  correlation with patient history and other diagnostic information is  necessary to determine patient infection status.  Positive results do  not rule out bacterial infection or co-infection with other viruses. If result is PRESUMPTIVE POSTIVE SARS-CoV-2 nucleic acids MAY BE PRESENT.   A presumptive positive result was obtained on the submitted specimen  and confirmed on repeat testing.  While 2019 novel coronavirus  (SARS-CoV-2) nucleic acids may be present in the submitted sample  additional confirmatory testing may be necessary for epidemiological  and / or clinical management purposes  to differentiate between   SARS-CoV-2 and other Sarbecovirus currently known to infect humans.  If clinically indicated additional testing with an alternate test  methodology [phone]) is advised. The SARS-CoV-2 RNA is generally  detectable in upper and lower respiratory sp ecimens during the acute  phase of infection. The expected result is Negative. Fact Sheet for Patients:  StrictlyIdeas.no Fact Sheet for Healthcare Providers: BankingDealers.co.za This test is not yet approved or cleared by the [place] FDA and has been authorized for detection and/or diagnosis of SARS-CoV-2 by FDA under an Emergency Use Authorization (EUA).  This EUA will remain in effect (meaning this test can be used) for the duration of the COVID-19 declaration under Section 564(b)(1) of the Act, 21 U.S.C. section 360bbb-3(b)(1), unless the authorization is terminated or revoked sooner. Performed at [facility], [address] [place], [place] [address]   Protime-INR     Status: None   Collection Time: 05/31/19  3:07 PM  Result Value Ref Range   Prothrombin Time 12.8 11.4 - 15.2 seconds   INR 1.0 0.8 - 1.2    Comment: (NOTE) INR goal varies based on device and disease states. Performed at [facility], [address] [place], [place] [address]   Type and screen [place]     Status: None   Collection Time: 05/31/19  8:16 PM  Result Value Ref Range   ABO/RH(D) A POS    Antibody Screen NEG    Sample Expiration      06/03/2019,2359 Performed at [facility], [address] [place], [place] [address]   Surgical pcr screen     Status: None   Collection Time: 06/01/19  1:56 AM   Specimen: Nasal Mucosa; Nasal Swab  Result Value Ref Range   MRSA, PCR NEGATIVE NEGATIVE   Staphylococcus aureus NEGATIVE NEGATIVE    Comment: (NOTE) The Xpert SA Assay (FDA approved for NASAL specimens in patients 69 years of age and  older), is one  component of a comprehensive surveillance program. It is not intended to diagnose infection nor to guide or monitor treatment. Performed at [facility], [address] [place], [place] [address]   CBC     Status: Abnormal   Collection Time: 06/01/19  3:22 AM  Result Value Ref Range   WBC 5.4 4.0 - 10.5 K/uL   RBC 3.99 3.87 - 5.11 MIL/uL   Hemoglobin 12.7 12.0 - 15.0 g/dL   HCT 29.541.7 62.136.0 - 30.846.0 %   MCV 104.5 (H) 80.0 - 100.0 fL   MCH 31.8 26.0 - 34.0 pg   MCHC 30.5 30.0 - 36.0 g/dL   RDW 65.713.2 84.611.5 - 96.215.5 %   Platelets 190 150 - 400 K/uL   nRBC 0.0 0.0 - 0.2 %    Comment: Performed at [facility], [address] [place], [place] [address]  Basic metabolic panel     Status: Abnormal   Collection Time: 06/01/19  3:22 AM  Result Value Ref Range   Sodium 136 135 - 145 mmol/L   Potassium 4.6 3.5 - 5.1 mmol/L   Chloride 102 98 - 111 mmol/L   CO2 24 22 - 32 mmol/L   Glucose, Bld 85 70 - 99 mg/dL   BUN 20 8 - 23 mg/dL   Creatinine, Ser 1.320.99 0.44 - 1.00 mg/dL   Calcium 8.9 8.9 - 44.010.3 mg/dL   GFR calc non Af Amer 57 (L) >60 mL/min   GFR calc Af Amer >60 >60 mL/min   Anion gap 10 5 - 15    Comment: Performed at [facility], [address] [place], [place] [address]    Dg Hip Unilat With Pelvis 2-3 Views Right  Result Date: 05/31/2019 CLINICAL DATA:  Rt hip pain with deformity s/p fall x 5 days ago. No old injury known. EXAM: DG HIP (WITH OR WITHOUT PELVIS) 2-3V RIGHT COMPARISON:  CT of the LEFT hip on 05/10/2019, portable pelvis on 08/21/2014 FINDINGS: There is an acute intertrochanteric fracture of the RIGHT hip, associated varus angulation and impaction at the fracture site. No evidence for dislocation. Degenerative changes are seen in the LOWER spine. IMPRESSION: Acute intertrochanteric fracture of the RIGHT hip. Electronically Signed   By: Norva PavlovElizabeth  Brown M.D.   On: 05/31/2019 14:09    ROS Blood pressure  (!) 146/96, pulse 81, temperature 98.2 F (36.8 C), resp. rate 14, height 5\' 5"  (1.651 m), weight 56.7 kg, SpO2 99 %. Physical Exam Physical Examination: General appearance - alert, well appearing, and in no distress Mental status - alert, oriented to person, place, and time Chest - clear to auscultation, no wheezes, rales or rhonchi, symmetric air entry Heart - normal rate, regular rhythm, normal S1, S2, no murmurs, rubs, clicks or gallops Abdomen - soft, nontender, nondistended, no masses or organomegaly Right lower extremity is shortened and slightly rotated. EHL/FHL/TA and gastroc are intact; Sensation and pulses intact RLE  X-Ray- Displaced right intertrochanteric femur fracture   Assessment/Plan: Right intertrochanteric femur fracture- Plan operative fixation with IM nail. Discussed nature of fracture and treatment including procedure, risks and rehab course. She elects to proceed. Plan on surgery late this afternoon  Ollen GrossFrank Daphine Loch 06/01/2019, 7:18 AM

## 2019-06-01 NOTE — Progress Notes (Signed)
[name].  PROGRESS NOTE    Molly HawthorneRita C Fischer  UJW:[id number] DOB: 08-28-1946 DOA: 05/31/2019 PCP: Donato SchultzLowne Chase, [name], DO   Brief Narrative:   [name] is a 73 y.o. female with medical history significant for depression with anxiety, left ureteral stricture with poorly functioning left kidney status post left nephrectomy, history of gastric bypass, and chronic back pain who presented [facility] for evaluation of right hip pain.  Patient states on 05/26/2019 she was getting up from a toilet when she lost her balance.  She tried to sit back down but missed and landed to the left side of the toilet landing on the floor on her right buttocks.  She has had persistent pain and difficulty with ambulation since then.  Patient states she initially felt this was a deep bruise therefore did not seek immediate medical attention.  She has had persistent pain and has been unable to bear weight therefore presented to the ED for further evaluation.  Patient states she normally ambulates with the use of a cane.  She denies any associated chest pain, palpitations, dyspnea, lightheadedness, dizziness, abdominal pain, dysuria, or loss of consciousness.  Patient recently had a left elbow intra-articular supracondylar humerus fracture status post ORIF with olecranon osteotomy and ulnar nerve release on 04/17/2019.  She has a left arm brace in place and states she has been recovering well.   Assessment & Plan:   Principal Problem:   Closed nondisplaced intertrochanteric fracture of right femur (HCC) Active Problems:   DEPRESSION/ANXIETY   Acute intertrochanteric fracture of the right hip:     - Occurred after a mechanical fall on 05/26/2019.     - Patient has a 3.9% preoperative 30-day risk for death, MI, or cardiac arrest based on the revised cardiac risk index.  Discussed with orthopedics, Dr. Shon BatonBrooks, who advised that patient will likely undergo ORIF by Dr. Lequita HaltAluisio on 06/01/2019.     - Orthopedics  consulted, plan for OR 06/01/2019     - Continue pain control with home OxyIR as needed and IV morphine as needed  Depression with anxiety:     - Continue home venlafaxine XR and as needed clonazepam  Chronic back pain:     - Continue home OxyIR 15 mg 4 times daily as needed and amitriptyline  ADD:     - Continue home Adderall XR  To OR this afternoon. Continue as above.   DVT prophylaxis: SCDs Code Status: FULL   Disposition Plan: TBD   Consultants:   Orthopedics   Subjective: "I was silly."  Objective: Vitals:   05/31/19 1658 05/31/19 1829 05/31/19 2017 06/01/19 0502  BP: (!) 138/98 139/85 (!) 154/95 (!) 146/96  Pulse: 79 87 82 81  Resp: 16 16 18 14   Temp:  98.3 F (36.8 C) 98.1 F (36.7 C) 98.2 F (36.8 C)  TempSrc: Oral Oral    SpO2: 98% 99% 100% 99%  Weight:      Height:        Intake/Output Summary (Last 24 hours) at 06/01/2019 1057 Last data filed at 06/01/2019 0502 Gross per 24 hour  Intake 180 ml  Output 1500 ml  Net -1320 ml   Filed Weights   05/31/19 1317  Weight: 56.7 kg    Examination:  General: 73 y.o. female resting in bed in NAD Cardiovascular: RRR, +S1, S2, no m/g/r, equal pulses throughout Respiratory: CTABL, no w/r/r, normal WOB GI: BS+, NDNT, no masses noted, no organomegaly noted MSK: No  e/c/c Neuro: Alert to name, follows commands      Data Reviewed: I have personally reviewed following labs and imaging studies.  CBC: Recent Labs  Lab 05/31/19 1442 06/01/19 0322  WBC 5.7 5.4  HGB 12.4 12.7  HCT 40.3 41.7  MCV 104.1* 104.5*  PLT 242 267   Basic Metabolic Panel: Recent Labs  Lab 05/31/19 1442 06/01/19 0322  NA 138 136  K 4.5 4.6  CL 105 102  CO2 24 24  GLUCOSE 93 85  BUN 21 20  CREATININE 1.07* 0.99  CALCIUM 9.0 8.9   GFR: Estimated Creatinine Clearance: 46.7 mL/min (by C-G formula based on SCr of 0.99 mg/dL). Liver Function Tests: No results for input(s): AST, ALT, ALKPHOS, BILITOT, PROT, ALBUMIN in  the last 168 hours. No results for input(s): LIPASE, AMYLASE in the last 168 hours. No results for input(s): AMMONIA in the last 168 hours. Coagulation Profile: Recent Labs  Lab 05/31/19 1507  INR 1.0   Cardiac Enzymes: No results for input(s): CKTOTAL, CKMB, CKMBINDEX, TROPONINI in the last 168 hours. BNP (last 3 results) No results for input(s): PROBNP in the last 8760 hours. HbA1C: No results for input(s): HGBA1C in the last 72 hours. CBG: No results for input(s): GLUCAP in the last 168 hours. Lipid Profile: No results for input(s): CHOL, HDL, LDLCALC, TRIG, CHOLHDL, LDLDIRECT in the last 72 hours. Thyroid Function Tests: No results for input(s): TSH, T4TOTAL, FREET4, T3FREE, THYROIDAB in the last 72 hours. Anemia Panel: No results for input(s): VITAMINB12, FOLATE, FERRITIN, TIBC, IRON, RETICCTPCT in the last 72 hours. Sepsis Labs: No results for input(s): PROCALCITON, LATICACIDVEN in the last 168 hours.  Recent Results (from the past 240 hour(s))  SARS Coronavirus 2 (Performed in [facility] hospital lab)     Status: None   Collection Time: 05/31/19  2:42 PM   Specimen: Nasopharyngeal Swab  Result Value Ref Range Status   SARS Coronavirus 2 NEGATIVE NEGATIVE Final    Comment: (NOTE) If result is NEGATIVE SARS-CoV-2 target nucleic acids are NOT DETECTED. The SARS-CoV-2 RNA is generally detectable in upper and lower  respiratory specimens during the acute phase of infection. The lowest  concentration of SARS-CoV-2 viral copies this assay can detect is 250  copies / mL. A negative result does not preclude SARS-CoV-2 infection  and should not be used as the sole basis for treatment or other  patient management decisions.  A negative result may occur with  improper specimen collection / handling, submission of specimen other  than nasopharyngeal swab, presence of viral mutation(s) within the  areas targeted by this assay, and inadequate number of viral copies  (<250 copies  / mL). A negative result must be combined with clinical  observations, patient history, and epidemiological information. If result is POSITIVE SARS-CoV-2 target nucleic acids are DETECTED. The SARS-CoV-2 RNA is generally detectable in upper and lower  respiratory specimens dur ing the acute phase of infection.  Positive  results are indicative of active infection with SARS-CoV-2.  Clinical  correlation with patient history and other diagnostic information is  necessary to determine patient infection status.  Positive results do  not rule out bacterial infection or co-infection with other viruses. If result is PRESUMPTIVE POSTIVE SARS-CoV-2 nucleic acids MAY BE PRESENT.   A presumptive positive result was obtained on the submitted specimen  and confirmed on repeat testing.  While 2019 novel coronavirus  (SARS-CoV-2) nucleic acids may be present in the submitted sample  additional confirmatory testing may be necessary  for epidemiological  and / or clinical management purposes  to differentiate between  SARS-CoV-2 and other Sarbecovirus currently known to infect humans.  If clinically indicated additional testing with an alternate test  methodology [phone]([id number]) is advised. The SARS-CoV-2 RNA is generally  detectable in upper and lower respiratory sp ecimens during the acute  phase of infection. The expected result is Negative. Fact Sheet for Patients:  BoilerBrush.com.cyhttps://www.fda.gov/media/136312/download Fact Sheet for Healthcare Providers: https://pope.com/https://www.fda.gov/media/136313/download This test is not yet approved or cleared by the [place] FDA and has been authorized for detection and/or diagnosis of SARS-CoV-2 by FDA under an Emergency Use Authorization (EUA).  This EUA will remain in effect (meaning this test can be used) for the duration of the COVID-19 declaration under Section 564(b)(1) of the Act, 21 U.S.C. section 360bbb-3(b)(1), unless the authorization is terminated or revoked sooner.  Performed at [facility], [address] [place], [place] [address]   Surgical pcr screen     Status: None   Collection Time: 06/01/19  1:56 AM   Specimen: Nasal Mucosa; Nasal Swab  Result Value Ref Range Status   MRSA, PCR NEGATIVE NEGATIVE Final   Staphylococcus aureus NEGATIVE NEGATIVE Final    Comment: (NOTE) The Xpert SA Assay (FDA approved for NASAL specimens in patients 71 years of age and older), is one component of a comprehensive surveillance program. It is not intended to diagnose infection nor to guide or monitor treatment. Performed at [facility], [address] [place], [place] [address]          Radiology Studies: Dg Hip Unilat With Pelvis 2-3 Views Right  Result Date: 05/31/2019 CLINICAL DATA:  Rt hip pain with deformity s/p fall x 5 days ago. No old injury known. EXAM: DG HIP (WITH OR WITHOUT PELVIS) 2-3V RIGHT COMPARISON:  CT of the LEFT hip on 05/10/2019, portable pelvis on 08/21/2014 FINDINGS: There is an acute intertrochanteric fracture of the RIGHT hip, associated varus angulation and impaction at the fracture site. No evidence for dislocation. Degenerative changes are seen in the LOWER spine. IMPRESSION: Acute intertrochanteric fracture of the RIGHT hip. Electronically Signed   By: Norva PavlovElizabeth  Brown M.D.   On: 05/31/2019 14:09     Scheduled Meds: . amitriptyline  75 mg Oral QHS  . amphetamine-dextroamphetamine  20 mg Oral Daily  . venlafaxine XR  150 mg Oral Daily   Continuous Infusions: . sodium chloride    .  ceFAZolin (ANCEF) IV       LOS: 1 day    Time spent: 25 minutes spent in the coordination of care today.    Teddy Spikeyrone A Brilee Port, DO [facility] Pager [phone]  If 7PM-7AM, please contact night-coverage [email or web address] Password [email or web address] 06/01/2019, 10:57 AM

## 2019-06-01 NOTE — Plan of Care (Signed)

## 2019-06-01 NOTE — Interval H&P Note (Signed)
History and Physical Interval Note:  06/01/2019 4:11 PM  [name]  has presented today for surgery, with the diagnosis of right intertrochanteric fracture of femur.  The various methods of treatment have been discussed with the patient and family. After consideration of risks, benefits and other options for treatment, the patient has consented to  Procedure(s): INTRAMEDULLARY (IM) NAIL INTERTROCHANTRIC (Right) as a surgical intervention.  The patient's history has been reviewed, patient examined, no change in status, stable for surgery.  I have reviewed the patient's chart and labs.  Questions were answered to the patient's satisfaction.     Pilar Plate Cyndee Giammarco

## 2019-06-01 NOTE — Anesthesia Postprocedure Evaluation (Signed)
Anesthesia Post Note  Patient: CADIE SORCI  Procedure(s) Performed: INTRAMEDULLARY (IM) NAIL INTERTROCHANTRIC (Right Hip)     Patient location during evaluation: PACU Anesthesia Type: General Level of consciousness: awake and alert Pain management: pain level controlled Vital Signs Assessment: post-procedure vital signs reviewed and stable Respiratory status: spontaneous breathing, nonlabored ventilation, respiratory function stable and patient connected to nasal cannula oxygen Cardiovascular status: blood pressure returned to baseline and stable Postop Assessment: no apparent nausea or vomiting Anesthetic complications: no    Last Vitals:  Vitals:   06/01/19 1825 06/01/19 1830  BP:  (!) 145/84  Pulse: 70 74  Resp: 16 16  Temp:  36.6 C  SpO2: 99% 95%    Last Pain:  Vitals:   06/01/19 1830  TempSrc:   PainSc: Asleep                 Gerhard Rappaport,W. EDMOND

## 2019-06-01 NOTE — Anesthesia Procedure Notes (Signed)
Procedure Name: Intubation Date/Time: 06/01/2019 4:31 PM Performed by: Lissa Morales, CRNA Pre-anesthesia Checklist: Patient identified, Emergency Drugs available, Suction available and Patient being monitored Patient Re-evaluated:Patient Re-evaluated prior to induction Oxygen Delivery Method: Circle system utilized Preoxygenation: Pre-oxygenation with 100% oxygen Induction Type: IV induction Laryngoscope Size: Mac and 3 Grade View: Grade I Tube type: Oral Tube size: 7.0 mm Number of attempts: 1 Airway Equipment and Method: Stylet and Oral airway Placement Confirmation: ETT inserted through vocal cords under direct vision,  positive ETCO2 and breath sounds checked- equal and bilateral Secured at: 21 cm Tube secured with: Tape Dental Injury: Teeth and Oropharynx as per pre-operative assessment  Comments: Dr. Verita Lamb intubated secondary to bed and pt's position

## 2019-06-01 NOTE — Op Note (Signed)
  OPERATIVE REPORT   PREOPERATIVE DIAGNOSIS: Right intertrochanteric femur fracture.   POSTOP DIAGNOSIS: Right intertrochanteric femur fracture.   PROCEDURE: Intramedullary nailing, Right intertrochanteric femur  fracture.   SURGEON: Gaynelle Arabian, M.D.   ASSISTANT: None  ANESTHESIA:General  Estimated BLOOD LOSS: 200 ml  DRAINS: None.   COMPLICATIONS:   None  CONDITION: -PACU - hemodynamically stable.    CLINICAL NOTE: [name] is an 73 y.o. female, who had a fall yesterday sustaining a displaced  Right intertrochanteric femur fracture. They have been cleared medically and present for operative fixation   PROCEDURE IN DETAIL: After successful administration of  General,  the patient was placed on the fracture table with Right lower extremity in a well-padded traction boot,  Right lower extremity in a well-padded leg holder. Under fluoroscopic guidance, the fracture wasreduced. The traction was locked in this position. Thigh was prepped  and draped in the usual sterile fashion. The guide pin for the Biomet  Affixus was then passed percutaneously to the tip of the greater  trochanter, and then entered into the femoral canal. It was passed into the  canal. The small incision was made and the starter reamer passed over  the guide pin. This was then removed. The nail which was an 11  mm  diameter short trochanteric nail with 125 degrees angle was attached to  the external guide and then passed into the femoral canal, impacted to  the appropriate depth in the canal, then we used the external guide to  place the lag screw. Through the external guide, a guide pin was  passed. Small incision made, and the guide pin was in the center of the  femoral head on the AP and slightly center to posterior on the lateral.  Length was 90 mm. Triple reamer was passed over the guide pin. 90 mm  lag screw was placed. It was then locked down with a locking screw.  Through the external guide, the  distal interlock was placed through the  static hole and this was 36 mm in length with excellent bicortical  purchase. The external guide was then removed. Hardware was in good  position and fracture was well reduced. Wound was copiously irrigated with saline  solution, and  closed deep with interrupted 1 Vicryl, subcu  interrupted 2-0 Vicryl, subcuticular running 4-0 Monocryl. Incision was  cleaned and dried and sterile dressings applied. The patient was awakened and  transported to recovery in stable condition.   Dione Plover Brittanya Winburn, MD    06/01/2019, 5:54 PM

## 2019-06-01 NOTE — H&P (View-Only) (Signed)
Reason for Consult:Right intertrochanteric femur fracture Referring Physician: Dr. Margie Egeyrone Kyle  [name] is an 73 y.o. female.  HPI: Ms. [name] is a [age] yo female who had a mechanical fall in her bathroom yesterday. She was  Getting off of the commode and went to sit back down and slipped to the side of the commode hitting her right buttock and falling on the floor. She had immediate right hip pain. She did not have any prodromal symptoms leading to the fall. She did not hit her head or have any loss of consciousness. Her only complaint is the right hip pain. She is not having any lower extremity weakness or paresthesia. [facility] was [facility] to [facility] after being seen in the [facility] ED and was diagnosed with an intertrochanteric femur fracture. She was admitted by the hospitalist service and we are consulting for fracture management  Past Medical History:  Diagnosis Date  . Abrasion of right heel    during admission 10/ 2019 right heel open skin due to movement on sheet  . ADD (attention deficit disorder)   . Anemia, mild   . Anxiety   . Arthritis    "joints, back"  . At high risk for falls    10-19-2018 pt has fell twice in past week, tripped  . Chronic back pain    "all over my back" (10/02/2013)  . Crushing injury of arm, right 02/06/1989's   "it was crushed; wore cast from fingers to top of my shoulder" (11/25/2  . Crushing injury of left wrist 05/11/1989's  . DOE (dyspnea on exertion)   . Essential tremor   . Fibromyalgia   . History of palpitations 2002   recurrent  . History of panic attacks   . History of pneumonia 08/21/2018   w/ acute respiratory failure/ hypoxia  . History of sepsis    admission 08-21-2018  secondary to uropathy obstructive from ureteral stone   . Hyperlipidemia   . Hypertension    10-19-2018 PER PT AMLODIPINE ON HOLD SINCE 10/ 2019 DUE TO KIDNEY ISSUES PER DOCTOR  . Idiopathic scoliosis 04/19/2013  . Left ureteral stone   . Migraines    "once/month"  (10/02/2013)  . Osteoporosis   . Other vitamin B12 deficiency anemia   . PONV (postoperative nausea and vomiting)    As a child  . Pulmonary nodules/lesions, multiple   . Renal insufficiency   . S/P gastric bypass 09/16/2003  . S/P insertion of spinal cord stimulator    per pt remote is missing  . Wears dentures    upper  . Wears glasses   . Wears glasses     Past Surgical History:  Procedure Laterality Date  . ABDOMINAL HYSTERECTOMY  1980's   W/  BSO  AND APPENDECTOMY  . CARDIAC CATHETERIZATION  12-13-2002    dr Eden Emmsnishan   normal coronaries  . CYSTOSCOPY WITH URETEROSCOPY AND STENT PLACEMENT Left 09/27/2018   Procedure: LEFT  URETEROSCOPY, HOLMIUM LASER LITHOTRIPSY;  Surgeon: Crist FatHerrick, Benjamin W, MD;  Location: [facility] ORS;  Service: Urology;  Laterality: Left;  . D & C HYSTERSCOPY RESECTION MYOMECTOMY  1980s  . HUMERUS FRACTURE SURGERY Left 04/17/2019    Comminuted complex left intra-articular distal humerus fracture/supracondylar humerus fracture displaced  . IR NEPHROSTOMY PLACEMENT LEFT  08/22/2018  . IR NEPHROSTOMY PLACEMENT LEFT  12/08/2018  . KNEE ARTHROSCOPY Right 03-31-2001    dr Simonne Comeaplington @[facility]   . NEPHROLITHOTOMY Left 10/20/2018   Procedure: LEFT NEPHROLITHOTOMY PERCUTANEOUS;  Surgeon: Crist FatHerrick, Benjamin W,  MD;  Location: [facility];  Service: Urology;  Laterality: Left;  . ORIF HUMERUS FRACTURE Left 04/17/2019   Procedure: Open reduction internal fixation left supracondylar humerus fracture with olecranon osteotomy and ulnar nerve release and anterior transposition and repair of structures as necessary;  Surgeon: Gramig, William, MD;  Location: [facility] OR;  Service: Orthopedics;  Laterality: Left;  3 hrs  . REDUCTION MAMMAPLASTY Bilateral 2001  . ROBOT ASSISTED LAPAROSCOPIC NEPHRECTOMY Left 12/25/2018   Procedure: XI ROBOTIC ASSISTED LAPAROSCOPIC NEPHRECTOMY, LEFT FLANK EXPLORATION WITH REMOVAL OF BATTERY PACK;  Surgeon: Herrick, Benjamin W, MD;  Location: [facility];  Service: Urology;   Laterality: Left;  . ROUX-EN-Y GASTRIC BYPASS  09-16-2003   dr m. martin  @[facility]   via Laparoscopy w/ gastrojejunostomy  . SPINAL CORD STIMULATOR IMPLANT  2016   10-19-2018  PER PT HAS REMOTE BUT HAS NOT USED IT SINCE DEC 2018  . TONSILLECTOMY AND ADENOIDECTOMY  1955  . TUBAL LIGATION  1980's    Family History  Adopted: Yes  Problem Relation Age of Onset  . COPD Mother   . Emphysema Mother   . Heart attack Mother   . Other Mother        tobacco abuse    Social History:  reports that she has never smoked. She has never used smokeless tobacco. She reports that she does not drink alcohol or use drugs.  Allergies:  Allergies  Allergen Reactions  . Adhesive [Tape] Itching    Medications: I have reviewed the patient's current medications.  Results for orders placed or performed during the hospital encounter of 05/31/19 (from the past 48 hour(s))  CBC     Status: Abnormal   Collection Time: 05/31/19  2:42 PM  Result Value Ref Range   WBC 5.7 4.0 - 10.5 K/uL   RBC 3.87 3.87 - 5.11 MIL/uL   Hemoglobin 12.4 12.0 - 15.0 g/dL   HCT 40.3 36.0 - 46.0 %   MCV 104.1 (H) 80.0 - 100.0 fL   MCH 32.0 26.0 - 34.0 pg   MCHC 30.8 30.0 - 36.0 g/dL   RDW 13.4 11.5 - 15.5 %   Platelets 242 150 - 400 K/uL   nRBC 0.0 0.0 - 0.2 %    Comment: Performed at [facility] [place], [address] [place], [place] [address]  Basic metabolic panel     Status: Abnormal   Collection Time: 05/31/19  2:42 PM  Result Value Ref Range   Sodium 138 135 - 145 mmol/L   Potassium 4.5 3.5 - 5.1 mmol/L   Chloride 105 98 - 111 mmol/L   CO2 24 22 - 32 mmol/L   Glucose, Bld 93 70 - 99 mg/dL   BUN 21 8 - 23 mg/dL   Creatinine, Ser 1.07 (H) 0.44 - 1.00 mg/dL   Calcium 9.0 8.9 - 10.3 mg/dL   GFR calc non Af Amer 52 (L) >60 mL/min   GFR calc Af Amer >60 >60 mL/min   Anion gap 9 5 - 15    Comment: Performed at [facility] [place], [address] [place], [place] [address]  SARS Coronavirus 2 (Performed in   hospital lab)     Status: None   Collection Time: 05/31/19  2:42 PM   Specimen: Nasopharyngeal Swab  Result Value Ref Range   SARS Coronavirus 2 NEGATIVE NEGATIVE    Comment: (NOTE) If result is NEGATIVE SARS-CoV-2 target nucleic acids are NOT DETECTED. The SARS-CoV-2 RNA is generally detectable   in upper and lower  respiratory specimens during the acute phase of infection. The lowest  concentration of SARS-CoV-2 viral copies this assay can detect is 250  copies / mL. A negative result does not preclude SARS-CoV-2 infection  and should not be used as the sole basis for treatment or other  patient management decisions.  A negative result may occur with  improper specimen collection / handling, submission of specimen other  than nasopharyngeal swab, presence of viral mutation(s) within the  areas targeted by this assay, and inadequate number of viral copies  (<250 copies / mL). A negative result must be combined with clinical  observations, patient history, and epidemiological information. If result is POSITIVE SARS-CoV-2 target nucleic acids are DETECTED. The SARS-CoV-2 RNA is generally detectable in upper and lower  respiratory specimens dur ing the acute phase of infection.  Positive  results are indicative of active infection with SARS-CoV-2.  Clinical  correlation with patient history and other diagnostic information is  necessary to determine patient infection status.  Positive results do  not rule out bacterial infection or co-infection with other viruses. If result is PRESUMPTIVE POSTIVE SARS-CoV-2 nucleic acids MAY BE PRESENT.   A presumptive positive result was obtained on the submitted specimen  and confirmed on repeat testing.  While 2019 novel coronavirus  (SARS-CoV-2) nucleic acids may be present in the submitted sample  additional confirmatory testing may be necessary for epidemiological  and / or clinical management purposes  to differentiate between   SARS-CoV-2 and other Sarbecovirus currently known to infect humans.  If clinically indicated additional testing with an alternate test  methodology [phone]) is advised. The SARS-CoV-2 RNA is generally  detectable in upper and lower respiratory sp ecimens during the acute  phase of infection. The expected result is Negative. Fact Sheet for Patients:  StrictlyIdeas.no Fact Sheet for Healthcare Providers: BankingDealers.co.za This test is not yet approved or cleared by the [place] FDA and has been authorized for detection and/or diagnosis of SARS-CoV-2 by FDA under an Emergency Use Authorization (EUA).  This EUA will remain in effect (meaning this test can be used) for the duration of the COVID-19 declaration under Section 564(b)(1) of the Act, 21 U.S.C. section 360bbb-3(b)(1), unless the authorization is terminated or revoked sooner. Performed at [facility], [address] [place], [place] [address]   Protime-INR     Status: None   Collection Time: 05/31/19  3:07 PM  Result Value Ref Range   Prothrombin Time 12.8 11.4 - 15.2 seconds   INR 1.0 0.8 - 1.2    Comment: (NOTE) INR goal varies based on device and disease states. Performed at [facility], [address] [place], [place] [address]   Type and screen [place]     Status: None   Collection Time: 05/31/19  8:16 PM  Result Value Ref Range   ABO/RH(D) A POS    Antibody Screen NEG    Sample Expiration      06/03/2019,2359 Performed at [facility], [address] [place], [place] [address]   Surgical pcr screen     Status: None   Collection Time: 06/01/19  1:56 AM   Specimen: Nasal Mucosa; Nasal Swab  Result Value Ref Range   MRSA, PCR NEGATIVE NEGATIVE   Staphylococcus aureus NEGATIVE NEGATIVE    Comment: (NOTE) The Xpert SA Assay (FDA approved for NASAL specimens in patients 69 years of age and  older), is one  component of a comprehensive surveillance program. It is not intended to diagnose infection nor to guide or monitor treatment. Performed at [facility], [address] [place], [place] [address]   CBC     Status: Abnormal   Collection Time: 06/01/19  3:22 AM  Result Value Ref Range   WBC 5.4 4.0 - 10.5 K/uL   RBC 3.99 3.87 - 5.11 MIL/uL   Hemoglobin 12.7 12.0 - 15.0 g/dL   HCT 29.541.7 62.136.0 - 30.846.0 %   MCV 104.5 (H) 80.0 - 100.0 fL   MCH 31.8 26.0 - 34.0 pg   MCHC 30.5 30.0 - 36.0 g/dL   RDW 65.713.2 84.611.5 - 96.215.5 %   Platelets 190 150 - 400 K/uL   nRBC 0.0 0.0 - 0.2 %    Comment: Performed at [facility], [address] [place], [place] [address]  Basic metabolic panel     Status: Abnormal   Collection Time: 06/01/19  3:22 AM  Result Value Ref Range   Sodium 136 135 - 145 mmol/L   Potassium 4.6 3.5 - 5.1 mmol/L   Chloride 102 98 - 111 mmol/L   CO2 24 22 - 32 mmol/L   Glucose, Bld 85 70 - 99 mg/dL   BUN 20 8 - 23 mg/dL   Creatinine, Ser 1.320.99 0.44 - 1.00 mg/dL   Calcium 8.9 8.9 - 44.010.3 mg/dL   GFR calc non Af Amer 57 (L) >60 mL/min   GFR calc Af Amer >60 >60 mL/min   Anion gap 10 5 - 15    Comment: Performed at [facility], [address] [place], [place] [address]    Dg Hip Unilat With Pelvis 2-3 Views Right  Result Date: 05/31/2019 CLINICAL DATA:  Rt hip pain with deformity s/p fall x 5 days ago. No old injury known. EXAM: DG HIP (WITH OR WITHOUT PELVIS) 2-3V RIGHT COMPARISON:  CT of the LEFT hip on 05/10/2019, portable pelvis on 08/21/2014 FINDINGS: There is an acute intertrochanteric fracture of the RIGHT hip, associated varus angulation and impaction at the fracture site. No evidence for dislocation. Degenerative changes are seen in the LOWER spine. IMPRESSION: Acute intertrochanteric fracture of the RIGHT hip. Electronically Signed   By: Norva PavlovElizabeth  Brown M.D.   On: 05/31/2019 14:09    ROS Blood pressure  (!) 146/96, pulse 81, temperature 98.2 F (36.8 C), resp. rate 14, height 5\' 5"  (1.651 m), weight 56.7 kg, SpO2 99 %. Physical Exam Physical Examination: General appearance - alert, well appearing, and in no distress Mental status - alert, oriented to person, place, and time Chest - clear to auscultation, no wheezes, rales or rhonchi, symmetric air entry Heart - normal rate, regular rhythm, normal S1, S2, no murmurs, rubs, clicks or gallops Abdomen - soft, nontender, nondistended, no masses or organomegaly Right lower extremity is shortened and slightly rotated. EHL/FHL/TA and gastroc are intact; Sensation and pulses intact RLE  X-Ray- Displaced right intertrochanteric femur fracture   Assessment/Plan: Right intertrochanteric femur fracture- Plan operative fixation with IM nail. Discussed nature of fracture and treatment including procedure, risks and rehab course. She elects to proceed. Plan on surgery late this afternoon  Ollen GrossFrank Ellajane Stong 06/01/2019, 7:18 AM

## 2019-06-01 NOTE — Transfer of Care (Signed)
Immediate Anesthesia Transfer of Care Note  Patient: ZOIEE WIMMER  Procedure(s) Performed: INTRAMEDULLARY (IM) NAIL INTERTROCHANTRIC (Right Hip)  Patient Location: PACU  Anesthesia Type:General  Level of Consciousness: awake, alert , sedated and patient cooperative  Airway & Oxygen Therapy: Patient Spontanous Breathing and Patient connected to face mask oxygen  Post-op Assessment: Report given to RN, Post -op Vital signs reviewed and stable and Patient moving all extremities X 4  Post vital signs: stable  Last Vitals:  Vitals Value Taken Time  BP 154/92 06/01/19 1800  Temp 36.4 C 06/01/19 1800  Pulse 73 06/01/19 1806  Resp 21 06/01/19 1806  SpO2 99 % 06/01/19 1806  Vitals shown include unvalidated device data.  Last Pain:  Vitals:   06/01/19 1800  TempSrc:   PainSc: Asleep      Patients Stated Pain Goal: 3 (83/29/19 1660)  Complications: No apparent anesthesia complications

## 2019-06-01 NOTE — Anesthesia Preprocedure Evaluation (Addendum)
Anesthesia Evaluation  Patient identified by MRN, date of birth, ID band Patient awake    Reviewed: Allergy & Precautions, H&P , NPO status , Patient's Chart, lab work & pertinent test results  History of Anesthesia Complications (+) PONV  Airway Mallampati: I  TM Distance: >3 FB Neck ROM: Full    Dental no notable dental hx. (+) Edentulous Upper, Edentulous Lower, Dental Advisory Given   Pulmonary neg pulmonary ROS,    Pulmonary exam normal breath sounds clear to auscultation       Cardiovascular hypertension,  Rhythm:Regular Rate:Normal     Neuro/Psych  Headaches, Anxiety Depression    GI/Hepatic Neg liver ROS, GERD  Controlled,  Endo/Other  negative endocrine ROS  Renal/GU negative Renal ROS  negative genitourinary   Musculoskeletal  (+) Arthritis , Osteoarthritis,  Fibromyalgia -  Abdominal   Peds  Hematology  (+) Blood dyscrasia, anemia ,   Anesthesia Other Findings   Reproductive/Obstetrics negative OB ROS                            Anesthesia Physical Anesthesia Plan  ASA: II  Anesthesia Plan: General   Post-op Pain Management:    Induction: Intravenous  PONV Risk Score and Plan: 4 or greater and Ondansetron, Dexamethasone and Treatment may vary due to age or medical condition  Airway Management Planned: Oral ETT  Additional Equipment:   Intra-op Plan:   Post-operative Plan: Extubation in OR  Informed Consent: I have reviewed the patients History and Physical, chart, labs and discussed the procedure including the risks, benefits and alternatives for the proposed anesthesia with the patient or authorized representative who has indicated his/her understanding and acceptance.     Dental advisory given  Plan Discussed with: CRNA  Anesthesia Plan Comments:         Anesthesia Quick Evaluation

## 2019-06-02 LAB — BASIC METABOLIC PANEL
Anion gap: 11 (ref 5–15)
BUN: 17 mg/dL (ref 8–23)
CO2: 23 mmol/L (ref 22–32)
Calcium: 8.9 mg/dL (ref 8.9–10.3)
Chloride: 101 mmol/L (ref 98–111)
Creatinine, Ser: 0.93 mg/dL (ref 0.44–1.00)
GFR calc Af Amer: >60 mL/min (ref >60)
GFR calc non Af Amer: >60 mL/min (ref >60)
Glucose, Bld: 143 mg/dL — ABNORMAL HIGH (ref 70–99)
Potassium: 4.5 mmol/L (ref 3.5–5.1)
Sodium: 135 mmol/L (ref 135–145)

## 2019-06-02 LAB — CBC
HCT: 37.3 % (ref 36.0–46.0)
Hemoglobin: 11.7 g/dL — ABNORMAL LOW (ref 12.0–15.0)
MCH: 32.3 pg (ref 26.0–34.0)
MCHC: 31.4 g/dL (ref 30.0–36.0)
MCV: 103 fL — ABNORMAL HIGH (ref 80.0–100.0)
Platelets: 300 10*3/uL (ref 150–400)
RBC: 3.62 MIL/uL — ABNORMAL LOW (ref 3.87–5.11)
RDW: 13 % (ref 11.5–15.5)
WBC: 5.2 10*3/uL (ref 4.0–10.5)
nRBC: 0 % (ref 0.0–0.2)

## 2019-06-02 LAB — VITAMIN D 25 HYDROXY (VIT D DEFICIENCY, FRACTURES): Vit D, 25-Hydroxy: 20.8 ng/mL — ABNORMAL LOW (ref 30.0–100.0)

## 2019-06-02 LAB — PTH, INTACT AND CALCIUM
Calcium, Total (PTH): 9.1 mg/dL (ref 8.7–10.3)
PTH: 17 pg/mL (ref 15–65)

## 2019-06-02 NOTE — Evaluation (Signed)
Physical Therapy Evaluation Patient Details Name: [name] MRN: [id number] DOB: 1945-11-26 Today's Date: 06/02/2019   History of Present Illness  Pt is a 73 y.o. female with recent admission 04/17/19 with left comminuted intra-articular distal humerus fx/supracondylar humerus fx S/p L humeral ORIF, olecranon fixation, ulnar nerve decompression and flexor pronator release on 6/9. Pt admitted 05/31/19 after fall sustaining femur fx and currently s/p right IM nail.  PMH includes HTN, chronic back pain, ADD, anxiety, RUE crus injury (1990s), fibromyalgia.  Clinical Impression  Patient is s/p above surgery resulting in functional limitations due to the deficits listed below (see PT Problem List).  Patient will benefit from skilled PT to increase their independence and safety with mobility to allow discharge to the venue listed below.  Pt with decreased mobility and unable to tolerate even bed mobility today.  Pt also with recent L UE fracture s/p L humeral ORIF and remains NWB on L UE.   Pt would benefit from d/c to post acute rehab setting.       Follow Up Recommendations SNF;CIR(if not CIR then SNF)    Equipment Recommendations  Wheelchair (measurements PT);Wheelchair cushion (measurements PT)    Recommendations for Other Services       Precautions / Restrictions Precautions Precautions: Fall Required Braces or Orthoses: Splint/Cast Splint/Cast: admitted with L UE splint Restrictions Weight Bearing Restrictions: Yes LUE Weight Bearing: Non weight bearing RLE Weight Bearing: Weight bearing as tolerated      Mobility  Bed Mobility Overal bed mobility: Needs Assistance Bed Mobility: Supine to Sit;Sit to Supine     Supine to sit: Max assist Sit to supine: Max assist   General bed mobility comments: increased time and effort, provided cues however pt attempting to perform her own way, assist required for scooting to EOB and LEs, pt also leaning on L UE with constant cues to maintain  NWB however pt reports increased R hip pain and offloading hip by leaning toward left side  Transfers                 General transfer comment: unable to tolerate  Ambulation/Gait                Stairs            Wheelchair Mobility    Modified Rankin (Stroke Patients Only)       Balance Overall balance assessment: Needs assistance;History of Falls Sitting-balance support: Feet supported;Single extremity supported Sitting balance-Leahy Scale: Poor                                       Pertinent Vitals/Pain Pain Assessment: 0-10 Pain Score: 10-Worst pain ever Pain Location: R hip/thigh Pain Descriptors / Indicators: Sore;Aching;Guarding Pain Intervention(s): Monitored during session;Premedicated before session;Repositioned    Home Living Family/patient expects to be discharged to:: Private residence Living Arrangements: Spouse/significant other;Other relatives Available Help at Discharge: Family;Available 24 hours/day Type of Home: Other(Comment)(townhome) Home Access: Stairs to enter Entrance Stairs-Rails: None Entrance Stairs-Number of Steps: 1 Home Layout: Two level;Able to live on main level with bedroom/bathroom Home Equipment: Dan HumphreysWalker - 2 wheels;Cane - single point;Grab bars - toilet;Hand held shower head;Shower seat Additional Comments: Husband in good shape to provide physical assist if needed. They care for 8-y.o. granddaughter    Prior Function Level of Independence: Independent with assistive device(s)         Comments: was using SPC  for mobility, fall in bathroom leading to this admission, husband assists as needed, pt typically does not perform stairs     Hand Dominance        Extremity/Trunk Assessment   Upper Extremity Assessment Upper Extremity Assessment: LUE deficits/detail LUE Deficits / Details: L elbow and forearm splinted    Lower Extremity Assessment Lower Extremity Assessment: Generalized  weakness;RLE deficits/detail RLE Deficits / Details: requiring assist due to pain    Cervical / Trunk Assessment Cervical / Trunk Assessment: Kyphotic(pt reports forward trunk flexion is baseline due to back hx)  Communication   Communication: No difficulties  Cognition Arousal/Alertness: Awake/alert Behavior During Therapy: WFL for tasks assessed/performed Overall Cognitive Status: Within Functional Limits for tasks assessed                                        General Comments      Exercises     Assessment/Plan    PT Assessment Patient needs continued PT services  PT Problem List Decreased strength;Decreased mobility;Decreased balance;Decreased activity tolerance;Decreased knowledge of precautions;Decreased knowledge of use of DME;Pain;Decreased range of motion;Decreased safety awareness       PT Treatment Interventions DME instruction;Therapeutic exercise;Functional mobility training;Therapeutic activities;Patient/family education;Balance training;Gait training;Wheelchair mobility training    PT Goals (Current goals can be found in the Care Plan section)  Acute Rehab PT Goals PT Goal Formulation: With patient Time For Goal Achievement: 06/22/19 Potential to Achieve Goals: Good    Frequency Min 4X/week   Barriers to discharge        Co-evaluation               AM-PAC PT "6 Clicks" Mobility  Outcome Measure Help needed turning from your back to your side while in a flat bed without using bedrails?: Total Help needed moving from lying on your back to sitting on the side of a flat bed without using bedrails?: Total Help needed moving to and from a bed to a chair (including a wheelchair)?: Total Help needed standing up from a chair using your arms (e.g., wheelchair or bedside chair)?: Total Help needed to walk in hospital room?: Total Help needed climbing 3-5 steps with a railing? : Total 6 Click Score: 6    End of Session   Activity  Tolerance: Patient limited by pain Patient left: in bed;with bed alarm set;with call bell/phone within reach Nurse Communication: Mobility status;Other (comment)(bed linen wet) PT Visit Diagnosis: History of falling (Z91.81);Other abnormalities of gait and mobility (R26.89)    Time: 9678-9381 PT Time Calculation (min) (ACUTE ONLY): 34 min   Charges:   PT Evaluation $PT Eval Low Complexity: 1 Low PT Treatments $Therapeutic Activity: 8-22 mins       Carmelia Bake, PT, DPT Acute Rehabilitation Services Office: [phone] Pager: [phone]  Trena Platt 06/02/2019, 1:17 PM

## 2019-06-02 NOTE — Plan of Care (Signed)
  Problem: Clinical Measurements: Goal: Respiratory complications will improve Outcome: Progressing   Problem: Clinical Measurements: Goal: Cardiovascular complication will be avoided Outcome: Progressing   Problem: Activity: Goal: Risk for activity intolerance will decrease Outcome: Progressing   Problem: Nutrition: Goal: Adequate nutrition will be maintained Outcome: Progressing   

## 2019-06-02 NOTE — Progress Notes (Signed)
[name]  PROGRESS NOTE    [name]  RUE:[id number] DOB: 18-Mar-1946 DOA: 05/31/2019 PCP: Ann Held, DO   Brief Narrative:   Molly Warmack Parkeris a 73 y.o.femalewith medical history significant fordepression with anxiety, left ureteral stricture with poorly functioning left kidney status post left nephrectomy, history of gastric bypass, and chronic back pain who presented to [place] ED for evaluation ofright hip pain.  Patient states on 05/26/2019 she was getting up from a toilet when she lost her balance. She tried to sit back down but missed and landed to the left side of the toilet landing on the floor onher right buttocks. She has had persistent pain and difficulty with ambulation since then. Patient states she initially felt this was a deep bruise therefore did not seek immediate medical attention. She has had persistent pain and has been unable to bear weight therefore presented to the ED for further evaluation. Patient states she normally ambulates with the use of a cane. She denies any associated chest pain, palpitations, dyspnea, lightheadedness, dizziness, abdominal pain, dysuria, or loss of consciousness.  Patient recently had a left elbow intra-articular supracondylar humerus fracture status post ORIF with olecranon osteotomy and ulnar nerve release on 04/17/2019. She has a left arm brace in place and states she has been recovering well.   Assessment & Plan:   Principal Problem:   Closed nondisplaced intertrochanteric fracture of right femur (HCC) Active Problems:   DEPRESSION/ANXIETY   Acute intertrochanteric fracture of the right hip:     - Occurred after a mechanical fall on 05/26/2019.     - Patient has a 3.9% preoperative 30-day risk for death, MI, or cardiac arrest based on the revised cardiac risk index.  Discussed with orthopedics, Dr. Rolena Infante, who advised that patient will likely undergo ORIF by Dr. Wynelle Link on 06/01/2019.     - Orthopedics  consulted, plan for OR 06/01/2019     - Continue pain control with home OxyIR as needed and IV morphine as needed     - now s/p INTRAMEDULLARY (IM) NAIL INTERTROCHANTRIC (Right)    - PT to eval (rec'ing SNF vs CIR)  Depression with anxiety:     - Continue home venlafaxine XR and as needed clonazepam  Chronic back pain:     - Continue home OxyIR 15 mg 4 times daily as needed and amitriptyline  ADD:     - Continue home Adderall XR   DVT prophylaxis: SCDs Code Status: FULL   Disposition Plan: TBD   Consultants:   Orthopedics   Subjective: "I'm not looking forward to it."  Objective: Vitals:   06/01/19 2137 06/01/19 2244 06/02/19 0258 06/02/19 0622  BP: (!) 160/99 (!) 136/95 112/77 110/74  Pulse: 75 91 (!) 103 93  Resp: 16 16 16 17   Temp: 98 F (36.7 C) 98.3 F (36.8 C) 98.5 F (36.9 C) 98.1 F (36.7 C)  TempSrc: Oral Oral Oral Oral  SpO2: 100% 97% 95% 99%  Weight:      Height:        Intake/Output Summary (Last 24 hours) at 06/02/2019 0651 Last data filed at 06/02/2019 0641 Gross per 24 hour  Intake 1392.28 ml  Output 1625 ml  Net -232.72 ml   Filed Weights   05/31/19 1317 06/01/19 1554  Weight: 56.7 kg 56.7 kg    Examination:  General: 73 y.o. female resting in bed in NAD Cardiovascular: RRR, +S1, S2, no m/g/r, equal pulses throughout Respiratory: CTABL, no w/r/r, normal  WOB GI: BS+, NDNT, no masses noted, no organomegaly noted MSK: No e/c/c Neuro: Alert to name, follows commands    Data Reviewed: I have personally reviewed following labs and imaging studies.  CBC: Recent Labs  Lab 05/31/19 1442 06/01/19 0322 06/02/19 0313  WBC 5.7 5.4 5.2  HGB 12.4 12.7 11.7*  HCT 40.3 41.7 37.3  MCV 104.1* 104.5* 103.0*  PLT 242 190 300   Basic Metabolic Panel: Recent Labs  Lab 05/31/19 1442 06/01/19 0322 06/02/19 0313  NA 138 136 135  K 4.5 4.6 4.5  CL 105 102 101  CO2 24 24 23   GLUCOSE 93 85 143*  BUN 21 20 17   CREATININE 1.07* 0.99  0.93  CALCIUM 9.0 8.9  9.1 8.9   GFR: Estimated Creatinine Clearance: 49.7 mL/min (by C-G formula based on SCr of 0.93 mg/dL). Liver Function Tests: No results for input(s): AST, ALT, ALKPHOS, BILITOT, PROT, ALBUMIN in the last 168 hours. No results for input(s): LIPASE, AMYLASE in the last 168 hours. No results for input(s): AMMONIA in the last 168 hours. Coagulation Profile: Recent Labs  Lab 05/31/19 1507  INR 1.0   Cardiac Enzymes: No results for input(s): CKTOTAL, CKMB, CKMBINDEX, TROPONINI in the last 168 hours. BNP (last 3 results) No results for input(s): PROBNP in the last 8760 hours. HbA1C: No results for input(s): HGBA1C in the last 72 hours. CBG: No results for input(s): GLUCAP in the last 168 hours. Lipid Profile: No results for input(s): CHOL, HDL, LDLCALC, TRIG, CHOLHDL, LDLDIRECT in the last 72 hours. Thyroid Function Tests: No results for input(s): TSH, T4TOTAL, FREET4, T3FREE, THYROIDAB in the last 72 hours. Anemia Panel: No results for input(s): VITAMINB12, FOLATE, FERRITIN, TIBC, IRON, RETICCTPCT in the last 72 hours. Sepsis Labs: No results for input(s): PROCALCITON, LATICACIDVEN in the last 168 hours.  Recent Results (from the past 240 hour(s))  SARS Coronavirus 2 (Performed in [facility] hospital lab)     Status: None   Collection Time: 05/31/19  2:42 PM   Specimen: Nasopharyngeal Swab  Result Value Ref Range Status   SARS Coronavirus 2 NEGATIVE NEGATIVE Final    Comment: (NOTE) If result is NEGATIVE SARS-CoV-2 target nucleic acids are NOT DETECTED. The SARS-CoV-2 RNA is generally detectable in upper and lower  respiratory specimens during the acute phase of infection. The lowest  concentration of SARS-CoV-2 viral copies this assay can detect is 250  copies / mL. A negative result does not preclude SARS-CoV-2 infection  and should not be used as the sole basis for treatment or other  patient management decisions.  A negative result may occur with   improper specimen collection / handling, submission of specimen other  than nasopharyngeal swab, presence of viral mutation(s) within the  areas targeted by this assay, and inadequate number of viral copies  (<250 copies / mL). A negative result must be combined with clinical  observations, patient history, and epidemiological information. If result is POSITIVE SARS-CoV-2 target nucleic acids are DETECTED. The SARS-CoV-2 RNA is generally detectable in upper and lower  respiratory specimens dur ing the acute phase of infection.  Positive  results are indicative of active infection with SARS-CoV-2.  Clinical  correlation with patient history and other diagnostic information is  necessary to determine patient infection status.  Positive results do  not rule out bacterial infection or co-infection with other viruses. If result is PRESUMPTIVE POSTIVE SARS-CoV-2 nucleic acids MAY BE PRESENT.   A presumptive positive result was obtained on the submitted specimen  and confirmed on repeat testing.  While 2019 novel coronavirus  (SARS-CoV-2) nucleic acids may be present in the submitted sample  additional confirmatory testing may be necessary for epidemiological  and / or clinical management purposes  to differentiate between  SARS-CoV-2 and other Sarbecovirus currently known to infect humans.  If clinically indicated additional testing with an alternate test  methodology [phone]([id number]) is advised. The SARS-CoV-2 RNA is generally  detectable in upper and lower respiratory sp ecimens during the acute  phase of infection. The expected result is Negative. Fact Sheet for Patients:  BoilerBrush.com.cyhttps://www.fda.gov/media/136312/download Fact Sheet for Healthcare Providers: https://pope.com/https://www.fda.gov/media/136313/download This test is not yet approved or cleared by the [place] FDA and has been authorized for detection and/or diagnosis of SARS-CoV-2 by FDA under an Emergency Use Authorization (EUA).  This EUA will  remain in effect (meaning this test can be used) for the duration of the COVID-19 declaration under Section 564(b)(1) of the Act, 21 U.S.C. section 360bbb-3(b)(1), unless the authorization is terminated or revoked sooner. Performed at [facility], [address] [place], [place] [address]   Surgical pcr screen     Status: None   Collection Time: 06/01/19  1:56 AM   Specimen: Nasal Mucosa; Nasal Swab  Result Value Ref Range Status   MRSA, PCR NEGATIVE NEGATIVE Final   Staphylococcus aureus NEGATIVE NEGATIVE Final    Comment: (NOTE) The Xpert SA Assay (FDA approved for NASAL specimens in patients 71 years of age and older), is one component of a comprehensive surveillance program. It is not intended to diagnose infection nor to guide or monitor treatment. Performed at [facility], [address] [place], [place] [address]          Radiology Studies: Dg C-arm 1-60 Min-no Report  Result Date: 06/01/2019 Fluoroscopy was utilized by the requesting physician.  No radiographic interpretation.   Dg Hip Unilat With Pelvis 2-3 Views Right  Result Date: 05/31/2019 CLINICAL DATA:  Rt hip pain with deformity s/p fall x 5 days ago. No old injury known. EXAM: DG HIP (WITH OR WITHOUT PELVIS) 2-3V RIGHT COMPARISON:  CT of the LEFT hip on 05/10/2019, portable pelvis on 08/21/2014 FINDINGS: There is an acute intertrochanteric fracture of the RIGHT hip, associated varus angulation and impaction at the fracture site. No evidence for dislocation. Degenerative changes are seen in the LOWER spine. IMPRESSION: Acute intertrochanteric fracture of the RIGHT hip. Electronically Signed   By: Norva PavlovElizabeth  Brown M.D.   On: 05/31/2019 14:09   Dg Femur, Min 2 Views Right  Result Date: 06/01/2019 CLINICAL DATA:  Known intratrochanteric fracture EXAM: RIGHT FEMUR 2 VIEWS COMPARISON:  Films from the previous day FLUOROSCOPY TIME:  Radiation Exposure Index (as provided by the  fluoroscopic device): 14.56 mGy If the device does not provide the exposure index: Fluoroscopy Time:  1 minutes Number of Acquired Images:  5 FINDINGS: Initial images again demonstrate the intratrochanteric fracture with mild impaction. Proximal medullary rod with fixation screws is subsequently noted. The fracture fragments are in near anatomic alignment. IMPRESSION: ORIF of proximal right femoral fracture. Electronically Signed   By: Alcide CleverMark  Lukens M.D.   On: 06/01/2019 18:17        Scheduled Meds: . amitriptyline  75 mg Oral QHS  . amphetamine-dextroamphetamine  20 mg Oral Daily  . aspirin EC  325 mg Oral Q breakfast  . docusate sodium  100 mg Oral BID  . venlafaxine XR  150 mg Oral Daily   Continuous Infusions: .  sodium chloride 75 mL/hr at 06/02/19 0346  .  ceFAZolin (ANCEF) IV 2 g (06/02/19 0641)  . methocarbamol (ROBAXIN) IV 500 mg (06/01/19 1820)     LOS: 2 days    Time spent: 25 minutes spent in the coordination of care today    Teddy Spikeyrone A Karmella Bouvier, DO [facility] Pager [phone]  If 7PM-7AM, please contact night-coverage [email or web address] Password [email or web address] 06/02/2019, 6:51 AM

## 2019-06-02 NOTE — Plan of Care (Signed)
  Problem: Clinical Measurements: Goal: Will remain free from infection Outcome: Progressing   Problem: Clinical Measurements: Goal: Respiratory complications will improve Outcome: Progressing   Problem: Clinical Measurements: Goal: Cardiovascular complication will be avoided Outcome: Progressing   Problem: Nutrition: Goal: Adequate nutrition will be maintained Outcome: Progressing   Problem: Elimination: Goal: Will not experience complications related to urinary retention Outcome: Progressing

## 2019-06-02 NOTE — Progress Notes (Signed)
Subjective: 1 Day Post-Op Procedure(s) (LRB): INTRAMEDULLARY (IM) NAIL INTERTROCHANTRIC (Right) Patient reports pain as moderate.   Feeling much better than she did last night  Objective: Vital signs in last 24 hours: Temp:  [97.6 F (36.4 C)-98.5 F (36.9 C)] 98.1 F (36.7 C) (07/25 0622) Pulse Rate:  [68-103] 93 (07/25 0622) Resp:  [16-18] 17 (07/25 0622) BP: (110-160)/(74-99) 110/74 (07/25 0622) SpO2:  [73 %-100 %] 99 % (07/25 0622) Weight:  [56.7 kg] 56.7 kg (07/24 1554)  Intake/Output from previous day: 07/24 0701 - 07/25 0700 In: 1392.3 [I.V.:1242.3; IV Piggyback:150] Out: 1245 [Urine:1425; Blood:200] Intake/Output this shift: Total I/O In: 100 [IV Piggyback:100] Out: 150 [Urine:150]  Recent Labs    05/31/19 1442 06/01/19 0322 06/02/19 0313  HGB 12.4 12.7 11.7*   Recent Labs    06/01/19 0322 06/02/19 0313  WBC 5.4 5.2  RBC 3.99 3.62*  HCT 41.7 37.3  PLT 190 300   Recent Labs    06/01/19 0322 06/02/19 0313  NA 136 135  K 4.6 4.5  CL 102 101  CO2 24 23  BUN 20 17  CREATININE 0.99 0.93  GLUCOSE 85 143*  CALCIUM 8.9  9.1 8.9   Recent Labs    05/31/19 1507  INR 1.0    Neurovascular intact Incision: has small smount bloody drainage from upper incision but not actively draining now No cellulitis present Compartment soft   Assessment/Plan: 1 Day Post-Op Procedure(s) (LRB): INTRAMEDULLARY (IM) NAIL INTERTROCHANTRIC (Right) Advance diet Up with therapy D/C IV fluids   Plan will be for discharge home with home health once deemed safe from a PT standpoint   [name] 06/02/2019, 6:45 AM

## 2019-06-02 NOTE — Progress Notes (Signed)
Pt stable at time of bedside rounding with Rich Fuchs. No needs at time of bedside rounding. No changes or pain to report.

## 2019-06-02 NOTE — Progress Notes (Signed)
Subjective: 1 Day Post-Op Procedure(s) (LRB): INTRAMEDULLARY (IM) NAIL INTERTROCHANTRIC (Right) Patient reports pain as 7 on 0-10 scale.   Due to void Tolerating PO w/o N/V -Flatus, -BM Denies CP, SOB, HA, dizziness, calf pain, sweats/chills.  Objective: Vital signs in last 24 hours: Temp:  [97.6 F (36.4 C)-98.5 F (36.9 C)] 98.1 F (36.7 C) (07/25 0622) Pulse Rate:  [68-103] 93 (07/25 0622) Resp:  [16-18] 17 (07/25 0622) BP: (110-160)/(74-99) 110/74 (07/25 0622) SpO2:  [73 %-100 %] 99 % (07/25 0622) Weight:  [56.7 kg] 56.7 kg (07/24 1554)  Intake/Output from previous day: 07/24 0701 - 07/25 0700 In: 2414.8 [P.O.:60; I.V.:2104.8; IV Piggyback:250] Out: 3009 [Urine:1425; Blood:200] Intake/Output this shift: No intake/output data recorded.  Recent Labs    05/31/19 1442 06/01/19 0322 06/02/19 0313  HGB 12.4 12.7 11.7*   Recent Labs    06/01/19 0322 06/02/19 0313  WBC 5.4 5.2  RBC 3.99 3.62*  HCT 41.7 37.3  PLT 190 300   Recent Labs    06/01/19 0322 06/02/19 0313  NA 136 135  K 4.6 4.5  CL 102 101  CO2 24 23  BUN 20 17  CREATININE 0.99 0.93  GLUCOSE 85 143*  CALCIUM 8.9  9.1 8.9   Recent Labs    05/31/19 1507  INR 1.0    Neurologically intact ABD soft Neurovascular intact Sensation intact distally Intact pulses distally Dorsiflexion/Plantar flexion intact Incision: scant drainage No cellulitis present Compartment soft   Assessment/Plan: 1 Day Post-Op Procedure(s) (LRB): INTRAMEDULLARY (IM) NAIL INTERTROCHANTRIC (Right) Advance diet Up with therapy WBAT Pain well controlled with current meds DVT ppx: TEDs, SCDs Dressing change tomorrow Plan to D/C with home health Sunday vs Monday.  Yvonne Kendall Ward 06/02/2019, 9:07 AM

## 2019-06-03 LAB — MAGNESIUM: Magnesium: 2.1 mg/dL (ref 1.7–2.4)

## 2019-06-03 LAB — BASIC METABOLIC PANEL WITH GFR
Anion gap: 8 (ref 5–15)
BUN: 22 mg/dL (ref 8–23)
CO2: 24 mmol/L (ref 22–32)
Calcium: 8.5 mg/dL — ABNORMAL LOW (ref 8.9–10.3)
Chloride: 104 mmol/L (ref 98–111)
Creatinine, Ser: 1.01 mg/dL — ABNORMAL HIGH (ref 0.44–1.00)
GFR calc Af Amer: >60 mL/min (ref >60)
GFR calc non Af Amer: 56 mL/min — ABNORMAL LOW (ref >60)
Glucose, Bld: 103 mg/dL — ABNORMAL HIGH (ref 70–99)
Potassium: 4.5 mmol/L (ref 3.5–5.1)
Sodium: 136 mmol/L (ref 135–145)

## 2019-06-03 LAB — CBC
HCT: 32.7 % — ABNORMAL LOW (ref 36.0–46.0)
Hemoglobin: 9.6 g/dL — ABNORMAL LOW (ref 12.0–15.0)
MCH: 31.8 pg (ref 26.0–34.0)
MCHC: 29.4 g/dL — ABNORMAL LOW (ref 30.0–36.0)
MCV: 108.3 fL — ABNORMAL HIGH (ref 80.0–100.0)
Platelets: 279 10*3/uL (ref 150–400)
RBC: 3.02 MIL/uL — ABNORMAL LOW (ref 3.87–5.11)
RDW: 13.3 % (ref 11.5–15.5)
WBC: 5.8 10*3/uL (ref 4.0–10.5)
nRBC: 0 % (ref 0.0–0.2)

## 2019-06-03 LAB — VITAMIN B12: Vitamin B-12: 572 pg/mL (ref 180–914)

## 2019-06-03 NOTE — Plan of Care (Signed)
°  Problem: Clinical Measurements: °Goal: Respiratory complications will improve °Outcome: Progressing °  °Problem: Clinical Measurements: °Goal: Cardiovascular complication will be avoided °Outcome: Progressing °  °Problem: Pain Managment: °Goal: General experience of comfort will improve °Outcome: Progressing °  °Problem: Safety: °Goal: Ability to remain free from injury will improve °Outcome: Progressing °  °

## 2019-06-03 NOTE — Progress Notes (Signed)
    Subjective:  Patient reports pain as mild to moderate.  Denies N/V/CP/SOB.   Objective:   VITALS:   Vitals:   06/02/19 0622 06/02/19 1341 06/02/19 2221 06/03/19 0543  BP: 110/74 119/74 111/71 115/71  Pulse: 93 (!) 101 96 86  Resp: 17 16 16 14   Temp: 98.1 F (36.7 C) 97.9 F (36.6 C) 98.3 F (36.8 C) 98.4 F (36.9 C)  TempSrc: Oral Oral Oral Oral  SpO2: 99% 96%  94%  Weight:      Height:        NAD Sensation intact distally Intact pulses distally Dorsiflexion/Plantar flexion intact Incision: mod dried drainage. no active drainage. Compartment soft   Lab Results  Component Value Date   WBC 5.8 06/03/2019   HGB 9.6 (L) 06/03/2019   HCT 32.7 (L) 06/03/2019   MCV 108.3 (H) 06/03/2019   PLT 279 06/03/2019   BMET    Component Value Date/Time   NA 136 06/03/2019 0319   K 4.5 06/03/2019 0319   CL 104 06/03/2019 0319   CO2 24 06/03/2019 0319   GLUCOSE 103 (H) 06/03/2019 0319   BUN 22 06/03/2019 0319   CREATININE 1.01 (H) 06/03/2019 0319   CREATININE 0.89 07/05/2016 0944   CALCIUM 8.5 (L) 06/03/2019 0319   CALCIUM 9.1 06/01/2019 0322   GFRNONAA 56 (L) 06/03/2019 0319   GFRAA >60 06/03/2019 0319     Assessment/Plan: 2 Days Post-Op   Principal Problem:   Closed nondisplaced intertrochanteric fracture of right femur (HCC) Active Problems:   DEPRESSION/ANXIETY   WBAT with walker DVT ppx: Aspirin, SCDs, TEDS PO pain control PT/OT Dispo: D/C home with HHPT    [name] 06/03/2019, 9:04 AM   Rod Can, MD Cell: [phone] [name] is now [facility] [address], [address], [place], [place] [address] Phone: [phone] [email or web address] Facebook  [employer]

## 2019-06-03 NOTE — Evaluation (Signed)
Occupational Therapy Evaluation Patient Details Name: [name] MRN: [id number] DOB: May 05, 1946 Today's Date: 06/03/2019    History of Present Illness Pt is a 73 y.o. female with recent admission 04/17/19 with left comminuted intra-articular distal humerus fx/supracondylar humerus fx S/p L humeral ORIF, olecranon fixation, ulnar nerve decompression and flexor pronator release on 6/9. Pt admitted 05/31/19 after fall sustaining femur fx and currently s/p right IM nail.  PMH includes HTN, chronic back pain, ADD, anxiety, RUE crus injury (1990s), fibromyalgia.   Clinical Impression   Pt admitted with above diagnoses, post surgical R hip pain and LUE pain and splint limiting ability to engage in BADL at desired level of ind. PTA pt reports being able to manage ind BADL with occasional light assist from husband. At time of eval, she is mod A +2 for bed mobility and t/f. Pt only able to tolerate mobility progression this date and not BADL engagement. She needs consistent verbal and physical cues throughout transfers to maintain NWB on her LUE. She was able to tolerate x2 stands at mod A +2. At this time, recommend pt has SNF level of care at d/c for safe BADL progression and engagement. Will continue to follow while acute.     Follow Up Recommendations  SNF;Supervision/Assistance - 24 hour    Equipment Recommendations  Other (comment)(defer)    Recommendations for Other Services       Precautions / Restrictions Precautions Precautions: Fall Required Braces or Orthoses: Splint/Cast Splint/Cast: admitted with L UE splint Restrictions LUE Weight Bearing: Non weight bearing RLE Weight Bearing: Weight bearing as tolerated Other Position/Activity Restrictions: pt reports doctor stated NWB in LUE      Mobility Bed Mobility Overal bed mobility: Needs Assistance Bed Mobility: Supine to Sit;Sit to Supine     Supine to sit: Mod assist;+2 for physical assistance;+2 for safety/equipment Sit to  supine: Mod assist;+2 for physical assistance;+2 for safety/equipment   General bed mobility comments: increased time needed with cues for sequencing, pt frequently bearing weight through LUE despite WB status; leaning to the L to offload R hip pain  Transfers Overall transfer level: Needs assistance Equipment used: Quad cane Transfers: Sit to/from Stand Sit to Stand: Mod assist;+2 physical assistance;+2 safety/equipment         General transfer comment: cues for hand placement and positioning of BLEs and to maintain NWB of LUE    Balance Overall balance assessment: Needs assistance;History of Falls Sitting-balance support: Feet supported;Single extremity supported Sitting balance-Leahy Scale: Fair     Standing balance support: Single extremity supported;During functional activity Standing balance-Leahy Scale: Poor Standing balance comment: reliant on ext support                           ADL either performed or assessed with clinical judgement   ADL Overall ADL's : Needs assistance/impaired Eating/Feeding: Set up;Bed level   Grooming: Set up;Bed level   Upper Body Bathing: Minimal assistance;Sitting;Bed level   Lower Body Bathing: Total assistance;Sit to/from stand;Sitting/lateral leans   Upper Body Dressing : Minimal assistance;Sitting;Bed level   Lower Body Dressing: Total assistance;Sitting/lateral leans;Sit to/from stand   Toilet Transfer: +2 for physical assistance;+2 for safety/equipment;Maximal assistance;Stand-pivot;BSC   Toileting- Clothing Manipulation and Hygiene: Maximal assistance;Sit to/from stand;Sitting/lateral lean   Tub/ Shower Transfer: Maximal assistance;3 in 1;Shower seat;Rolling walker   Functional mobility during ADLs: Moderate assistance;+2 for physical assistance;+2 for safety/equipment General ADL Comments: pt with pain limiting this date, need frequent cues to  maintain LUE WB status     Vision Patient Visual Report: No change  from baseline       Perception     Praxis      Pertinent Vitals/Pain Pain Assessment: 0-10 Pain Score: 7  Pain Location: R hip/thigh Pain Descriptors / Indicators: Sore;Aching;Guarding Pain Intervention(s): Monitored during session;Premedicated before session;Repositioned;Ice applied;Limited activity within patient's tolerance     Hand Dominance     Extremity/Trunk Assessment Upper Extremity Assessment Upper Extremity Assessment: LUE deficits/detail;Generalized weakness LUE Deficits / Details: L elbow and forearm splinted, NWB- post L elbow and humeral fx and ulnar nerve release LUE: Unable to fully assess due to pain;Unable to fully assess due to immobilization   Lower Extremity Assessment Lower Extremity Assessment: Defer to PT evaluation       Communication Communication Communication: No difficulties   Cognition Arousal/Alertness: Awake/alert Behavior During Therapy: WFL for tasks assessed/performed Overall Cognitive Status: Within Functional Limits for tasks assessed                                     General Comments       Exercises     Shoulder Instructions      Home Living Family/patient expects to be discharged to:: Private residence Living Arrangements: Spouse/significant other;Other relatives Available Help at Discharge: Family;Available 24 hours/day Type of Home: Other(Comment)(townhouse) Home Access: Stairs to enter Entrance Stairs-Number of Steps: 1 Entrance Stairs-Rails: None Home Layout: Two level;Able to live on main level with bedroom/bathroom     Bathroom Shower/Tub: Walk-in shower   Bathroom Toilet: Handicapped height     Home Equipment: Environmental consultantWalker - 2 wheels;Cane - single point;Grab bars - toilet;Hand held shower head;Shower seat   Additional Comments: Husband in good shape to provide physical assist if needed. They care for 8-y.o. granddaughter      Prior Functioning/Environment Level of Independence: Independent with  assistive device(s)        Comments: was using SPC for mobility, fall in bathroom leading to this admission, husband assists as needed, pt typically does not perform stairs. States she is able to manage ind bathing/dressing        OT Problem List: Decreased strength;Decreased knowledge of use of DME or AE;Decreased knowledge of precautions;Decreased range of motion;Decreased activity tolerance;Impaired UE functional use;Impaired balance (sitting and/or standing);Pain      OT Treatment/Interventions: Self-care/ADL training;Therapeutic exercise;Patient/family education;Balance training;Energy conservation;Therapeutic activities;DME and/or AE instruction;Cognitive remediation/compensation    OT Goals(Current goals can be found in the care plan section) Acute Rehab OT Goals Patient Stated Goal: to get better OT Goal Formulation: With patient Time For Goal Achievement: 06/17/19 Potential to Achieve Goals: Good  OT Frequency: Min 2X/week   Barriers to D/C:            Co-evaluation PT/OT/SLP Co-Evaluation/Treatment: Yes Reason for Co-Treatment: Complexity of the patient's impairments (multi-system involvement);For patient/therapist safety;To address functional/ADL transfers PT goals addressed during session: Mobility/safety with mobility;Proper use of DME OT goals addressed during session: ADL's and self-care      AM-PAC OT "6 Clicks" Daily Activity     Outcome Measure Help from another person eating meals?: None Help from another person taking care of personal grooming?: A Little Help from another person toileting, which includes using toliet, bedpan, or urinal?: Total Help from another person bathing (including washing, rinsing, drying)?: Total Help from another person to put on and taking off regular upper body clothing?: A Little Help  from another person to put on and taking off regular lower body clothing?: Total 6 Click Score: 13   End of Session Equipment Utilized During  Treatment: Gait belt;Other (comment)(QC) Nurse Communication: Mobility status  Activity Tolerance: Patient limited by pain Patient left: in bed;with call bell/phone within reach;with bed alarm set  OT Visit Diagnosis: Other abnormalities of gait and mobility (R26.89);History of falling (Z91.81);Muscle weakness (generalized) (M62.81);Pain Pain - Right/Left: Right Pain - part of body: Hip;Leg                Time: [id number] OT Time Calculation (min): 36 min Charges:  OT Evaluation $OT Eval Moderate Complexity: 1 Mod  Dalphine HandingKaylee Julie-Anne Torain, MSOT, OTR/L Behavioral Health OT/ Acute Relief OT [facility] Office: [phone]  Dalphine HandingKaylee Erron Wengert 06/03/2019, 4:24 PM

## 2019-06-03 NOTE — Progress Notes (Signed)
[name].  PROGRESS NOTE    [name]  ZOX:[id number] DOB: 1946-06-01 DOA: 05/31/2019 PCP: Donato SchultzLowne Chase, [name], DO   Brief Narrative:   [name] a 73 y.o.femalewith medical history significant fordepression with anxiety, left ureteral stricture with poorly functioning left kidney status post left nephrectomy, history of gastric bypass, and chronic back pain who [facility] for evaluation ofright hip pain.  Patient states on 05/26/2019 she was getting up from a toilet when she lost her balance. She tried to sit back down but missed and landed to the left side of the toilet landing on the floor onher right buttocks. She has had persistent pain and difficulty with ambulation since then. Patient states she initially felt this was a deep bruise therefore did not seek immediate medical attention. She has had persistent pain and has been unable to bear weight therefore presented to the ED for further evaluation. Patient states she normally ambulates with the use of a cane. She denies any associated chest pain, palpitations, dyspnea, lightheadedness, dizziness, abdominal pain, dysuria, or loss of consciousness.  Patient recently had a left elbow intra-articular supracondylar humerus fracture status post ORIF with olecranon osteotomy and ulnar nerve release on 04/17/2019. She has a left arm brace in place and states she has been recovering well.   Assessment & Plan:   Principal Problem:   Closed nondisplaced intertrochanteric fracture of right femur (HCC) Active Problems:   DEPRESSION/ANXIETY   Acute intertrochanteric fracture of the right hip: - Occurred after a mechanical fall on 05/26/2019. - Patient has a 3.9% preoperative 30-day risk for death, MI, or cardiac arrest based on the revised cardiac risk index. Discussed with orthopedics, Dr. Shon BatonBrooks, who advised that patient will likely undergo ORIF by Dr. Lequita HaltAluisio on 06/01/2019. - Orthopedics  consulted, plan for OR 06/01/2019 - Continue pain control with home OxyIR as needed and IV morphine as needed     - now s/p Right intertrochanteric IMN    - PT to eval (rec'ing SNF vs CIR)     - CIR team rec's SNF vs HHPT; case management consulted  Depression with anxiety: - Continue home venlafaxine XR and as needed clonazepam  Chronic back pain: - Continue home OxyIR 15 mg 4 times daily as needed and amitriptyline  ADD: - Continue home Adderall XR  Macrocytic anemia     - check B12/folate  DVT prophylaxis: SCDs Code Status: FULL   Disposition Plan: TBD   Consultants:   Orthopedics  Procedures:   Right intertrochanteric IMN   Subjective: "I got a little sore."  Objective: Vitals:   06/02/19 0622 06/02/19 1341 06/02/19 2221 06/03/19 0543  BP: 110/74 119/74 111/71 115/71  Pulse: 93 (!) 101 96 86  Resp: 17 16 16 14   Temp: 98.1 F (36.7 C) 97.9 F (36.6 C) 98.3 F (36.8 C) 98.4 F (36.9 C)  TempSrc: Oral Oral Oral Oral  SpO2: 99% 96%  94%  Weight:      Height:        Intake/Output Summary (Last 24 hours) at 06/03/2019 1326 Last data filed at 06/03/2019 1300 Gross per 24 hour  Intake 1320 ml  Output 1750 ml  Net -430 ml   Filed Weights   05/31/19 1317 06/01/19 1554  Weight: 56.7 kg 56.7 kg    Examination:  General:73 y.o.femaleresting in bed in NAD Cardiovascular: RRR, +S1, S2, no m/g/r, equal pulses throughout Respiratory: CTABL, no w/r/r, normal WOB GI: BS+, NDNT, no masses noted,  no organomegaly noted MSK: No e/c/c Neuro: Alert to name, follows commands   Data Reviewed: I have personally reviewed following labs and imaging studies.  CBC: Recent Labs  Lab 05/31/19 1442 06/01/19 0322 06/02/19 0313 06/03/19 0319  WBC 5.7 5.4 5.2 5.8  HGB 12.4 12.7 11.7* 9.6*  HCT 40.3 41.7 37.3 32.7*  MCV 104.1* 104.5* 103.0* 108.3*  PLT 242 190 300 279   Basic Metabolic Panel: Recent Labs  Lab 05/31/19 1442 06/01/19 0322  06/02/19 0313 06/03/19 0319  NA 138 136 135 136  K 4.5 4.6 4.5 4.5  CL 105 102 101 104  CO2 24 24 23 24   GLUCOSE 93 85 143* 103*  BUN 21 20 17 22   CREATININE 1.07* 0.99 0.93 1.01*  CALCIUM 9.0 8.9  9.1 8.9 8.5*  MG  --   --   --  2.1   GFR: Estimated Creatinine Clearance: 45.7 mL/min (A) (by C-G formula based on SCr of 1.01 mg/dL (H)). Liver Function Tests: No results for input(s): AST, ALT, ALKPHOS, BILITOT, PROT, ALBUMIN in the last 168 hours. No results for input(s): LIPASE, AMYLASE in the last 168 hours. No results for input(s): AMMONIA in the last 168 hours. Coagulation Profile: Recent Labs  Lab 05/31/19 1507  INR 1.0   Cardiac Enzymes: No results for input(s): CKTOTAL, CKMB, CKMBINDEX, TROPONINI in the last 168 hours. BNP (last 3 results) No results for input(s): PROBNP in the last 8760 hours. HbA1C: No results for input(s): HGBA1C in the last 72 hours. CBG: No results for input(s): GLUCAP in the last 168 hours. Lipid Profile: No results for input(s): CHOL, HDL, LDLCALC, TRIG, CHOLHDL, LDLDIRECT in the last 72 hours. Thyroid Function Tests: No results for input(s): TSH, T4TOTAL, FREET4, T3FREE, THYROIDAB in the last 72 hours. Anemia Panel: No results for input(s): VITAMINB12, FOLATE, FERRITIN, TIBC, IRON, RETICCTPCT in the last 72 hours. Sepsis Labs: No results for input(s): PROCALCITON, LATICACIDVEN in the last 168 hours.  Recent Results (from the past 240 hour(s))  SARS Coronavirus 2 (Performed in [facility] lab)     Status: None   Collection Time: 05/31/19  2:42 PM   Specimen: Nasopharyngeal Swab  Result Value Ref Range Status   SARS Coronavirus 2 NEGATIVE NEGATIVE Final    Comment: (NOTE) If result is NEGATIVE SARS-CoV-2 target nucleic acids are NOT DETECTED. The SARS-CoV-2 RNA is generally detectable in upper and lower  respiratory specimens during the acute phase of infection. The lowest  concentration of SARS-CoV-2 viral copies this assay  can detect is 250  copies / mL. A negative result does not preclude SARS-CoV-2 infection  and should not be used as the sole basis for treatment or other  patient management decisions.  A negative result may occur with  improper specimen collection / handling, submission of specimen other  than nasopharyngeal swab, presence of viral mutation(s) within the  areas targeted by this assay, and inadequate number of viral copies  (<250 copies / mL). A negative result must be combined with clinical  observations, patient history, and epidemiological information. If result is POSITIVE SARS-CoV-2 target nucleic acids are DETECTED. The SARS-CoV-2 RNA is generally detectable in upper and lower  respiratory specimens dur ing the acute phase of infection.  Positive  results are indicative of active infection with SARS-CoV-2.  Clinical  correlation with patient history and other diagnostic information is  necessary to determine patient infection status.  Positive results do  not rule out bacterial infection or co-infection with other viruses.  If result is PRESUMPTIVE POSTIVE SARS-CoV-2 nucleic acids MAY BE PRESENT.   A presumptive positive result was obtained on the submitted specimen  and confirmed on repeat testing.  While 2019 novel coronavirus  (SARS-CoV-2) nucleic acids may be present in the submitted sample  additional confirmatory testing may be necessary for epidemiological  and / or clinical management purposes  to differentiate between  SARS-CoV-2 and other Sarbecovirus currently known to infect humans.  If clinically indicated additional testing with an alternate test  methodology [phone]) is advised. The SARS-CoV-2 RNA is generally  detectable in upper and lower respiratory sp ecimens during the acute  phase of infection. The expected result is Negative. Fact Sheet for Patients:  StrictlyIdeas.no Fact Sheet for Healthcare Providers:  BankingDealers.co.za This test is not yet approved or cleared by the [place] FDA and has been authorized for detection and/or diagnosis of SARS-CoV-2 by FDA under an Emergency Use Authorization (EUA).  This EUA will remain in effect (meaning this test can be used) for the duration of the COVID-19 declaration under Section 564(b)(1) of the Act, 21 U.S.C. section 360bbb-3(b)(1), unless the authorization is terminated or revoked sooner. Performed at [facility], [address] [place], [place] [address]   Surgical pcr screen     Status: None   Collection Time: 06/01/19  1:56 AM   Specimen: Nasal Mucosa; Nasal Swab  Result Value Ref Range Status   MRSA, PCR NEGATIVE NEGATIVE Final   Staphylococcus aureus NEGATIVE NEGATIVE Final    Comment: (NOTE) The Xpert SA Assay (FDA approved for NASAL specimens in patients 25 years of age and older), is one component of a comprehensive surveillance program. It is not intended to diagnose infection nor to guide or monitor treatment. Performed at [facility], [place] [address] [place], [place] [address]      Radiology Studies: Dg C-arm 1-60 Min-no Report  Result Date: 06/01/2019 Fluoroscopy was utilized by the requesting physician.  No radiographic interpretation.   Dg Femur, Min 2 Views Right  Result Date: 06/01/2019 CLINICAL DATA:  Known intratrochanteric fracture EXAM: RIGHT FEMUR 2 VIEWS COMPARISON:  Films from the previous day FLUOROSCOPY TIME:  Radiation Exposure Index (as provided by the fluoroscopic device): 14.56 mGy If the device does not provide the exposure index: Fluoroscopy Time:  1 minutes Number of Acquired Images:  5 FINDINGS: Initial images again demonstrate the intratrochanteric fracture with mild impaction. Proximal medullary rod with fixation screws is subsequently noted. The fracture fragments are in near anatomic alignment. IMPRESSION: ORIF of proximal right femoral  fracture. Electronically Signed   By: Inez Catalina M.D.   On: 06/01/2019 18:17    Scheduled Meds: . amitriptyline  75 mg Oral QHS  . amphetamine-dextroamphetamine  20 mg Oral Daily  . aspirin EC  325 mg Oral Q breakfast  . docusate sodium  100 mg Oral BID  . venlafaxine XR  150 mg Oral Daily   Continuous Infusions: . sodium chloride 75 mL/hr at 06/02/19 0600  . methocarbamol (ROBAXIN) IV 500 mg (06/01/19 1820)     LOS: 3 days    Time spent: 25 minutes spent in the coordination of care today.    Jonnie Finner, DO [facility] Pager [phone]  If 7PM-7AM, please contact night-coverage [email or web address] Password [email or web address] 06/03/2019, 1:26 PM

## 2019-06-03 NOTE — TOC Initial Note (Addendum)
Transition of Care [facility]) - Initial/Assessment Note    Patient Details  Name: [name] MRN: [id number] Date of Birth: [birth year]  Transition of Care [facility]) CM/SW Contact:    Elliot Gurney [place], [place] Phone Number: 06/03/2019, 2:35 PM  Clinical Narrative:                 Patient is a 73 year old female admitted to the hospital due to a Closed nondisplaced intertrochanteric fracture of right femur ([place]). Patient resides with her husband and 68 year old granddaughter. PT is recommending SNF placement for patient, however patient is reluctant due to concerns for care of her 48 year old granddaughter in her absence. Per patient, her husband travels for work and may not be available. There is a possibility that her daughter will be able to come from [place] to assist but she is not sure at this time. Per patient she would rather discharge home with [facility] but will plan to discuss this plan with her husband and provider before final decision is made. Per patient, if she were to go to the SNF, she would only be willing to stay approximately 1 week as her granddaughter will start school soon. Patient did not want bed search started until final decision was made.  Expected Discharge Plan: Skilled Nursing Facility(PT requesting SNF, however patient reluctant as she provides care for her 67 year old granddaughter) Barriers to Discharge: Family Issues(patient provides care for her 7 year old granddaughter. Her husband travels for work. Patient concerned about supervision for her)   Patient Goals and CMS Choice Patient states their goals for this hospitalization and ongoing recovery are:: Patient would like to work on getting stronger, however is hoping that she will be able to return home with [facility]      Expected Discharge Plan and Services Expected Discharge Plan: Skilled Nursing Facility(PT requesting SNF, however patient reluctant as she provides care for her 98 year old granddaughter) In-house Referral:  Clinical Social Work   Post Acute Care Choice: Home Health(patient prefers [facility] however if SNF is highly recommended would only be able to staty for 1 week due to care needs of 8 year granddaughter) Living arrangements for the past 2 months: Single Family Home                                      Prior Living Arrangements/Services Living arrangements for the past 2 months: Single Family Home Lives with:: Spouse   Do you feel safe going back to the place where you live?: Yes      Need for Family Participation in Patient Care: Yes (Comment) Care giver support system in place?: Yes (comment) Current home services: DME Criminal Activity/Legal Involvement Pertinent to Current Situation/Hospitalization: No - Comment as needed  Activities of Daily Living Home Assistive Devices/Equipment: Cane (specify quad or straight), Eyeglasses ADL Screening (condition at time of admission) Patient's cognitive ability adequate to safely complete daily activities?: Yes Is the patient deaf or have difficulty hearing?: No Does the patient have difficulty seeing, even when wearing glasses/contacts?: No Does the patient have difficulty concentrating, remembering, or making decisions?: No Patient able to express need for assistance with ADLs?: Yes Does the patient have difficulty dressing or bathing?: Yes Independently performs ADLs?: Yes (appropriate for developmental age) Does the patient have difficulty walking or climbing stairs?: Yes Weakness of Legs: Both Weakness of Arms/Hands: Left  Permission Sought/Granted  Permission sought to share information with : Family Supports Permission granted to share information with : Yes, Verbal Permission Granted  Share Information with NAME: Drexel IhaJohn Ponder     Permission granted to share info w Relationship: spouse  Permission granted to share info w Contact Information: [phone]  Emotional Assessment Appearance:: Appears older than stated  age Attitude/Demeanor/Rapport: Engaged Affect (typically observed): Accepting Orientation: : Oriented to Self, Oriented to  Time, Oriented to Place, Oriented to Situation Alcohol / Substance Use: Not Applicable Psych Involvement: No (comment)  Admission diagnosis:  Closed nondisplaced intertrochanteric fracture of right femur, initial encounter (HCC) [S72.144A] Hip fracture (HCC) [S72.009A] Patient Active Problem List   Diagnosis Date Noted  . Closed nondisplaced intertrochanteric fracture of right femur (HCC) 05/31/2019  . Closed displaced comminuted supracondylar fracture of left humerus without intercondylar fracture 04/17/2019  . Nonfunctioning kidney 12/25/2018  . Hydronephrosis with ureteral stricture, not elsewhere classified 12/21/2018  . Malnutrition of moderate degree 12/08/2018  . Delirium 12/07/2018  . Hydronephrosis of left kidney 12/07/2018  . Pyelonephritis 12/07/2018  . Protein-calorie malnutrition, severe 08/24/2018  . Pressure injury of skin 08/22/2018  . Obstruction of left ureteropelvic junction (UPJ) due to stone 08/21/2018  . Hypoalbuminemia 08/21/2018  . Chronic pain disorder 08/21/2018  . Thrombocytopenia (HCC) 08/21/2018  . Acute renal failure (ARF) (HCC) 08/21/2018  . Compression fracture of spine (HCC) 08/21/2018  . Acute respiratory failure with hypoxemia (HCC)   . Hematoma 06/03/2018  . Dysphagia 02/07/2018  . Generalized anxiety disorder 07/04/2017  . Nonintractable headache 07/04/2017  . Exertional dyspnea 08/05/2016  . Sepsis (HCC) 03/07/2015  . Hypokalemia 03/07/2015  . Acute encephalopathy 03/06/2015  . Fever 03/06/2015  . Chronic back pain 01/15/2015  . Obesity (BMI 30-39.9) 10/15/2013  . Pulmonary nodules 10/15/2013  . CAP (community acquired pneumonia) 10/02/2013  . Essential hypertension 08/24/2013  . Idiopathic scoliosis 04/19/2013  . Back pain 04/19/2013  . Osteoporosis 04/19/2013  . Edema 04/19/2013  . Mild diastolic dysfunction  01/01/2013  . Leg pain, bilateral 12/16/2012  . ADD (attention deficit disorder) 05/24/2012  . Involuntary movements 05/24/2012  . DIZZINESS 12/31/2009  . TREMOR, ESSENTIAL 12/12/2009  . PALPITATIONS, RECURRENT 12/12/2009  . OTHER VITAMIN B12 DEFICIENCY ANEMIA 10/30/2009  . FEMALE STRESS INCONTINENCE 10/28/2009  . MEMORY LOSS 10/28/2009  . Morbid obesity (HCC) 12/03/2008  . GERD 12/03/2008  . ANEMIA, MILD 06/26/2008  . DEPRESSION/ANXIETY 12/28/2007  . PICA 12/28/2007  . ACUTE BRONCHITIS 12/28/2007  . FATIGUE 12/28/2007  . Hyperlipidemia 05/15/2007  . ARTIFICIAL MENOPAUSE 05/15/2007  . Myalgia and myositis, unspecified 05/15/2007  . HEADACHE 05/15/2007  . CHEST PAIN, ATYPICAL 05/15/2007  . SYMPTOM, PAIN, ABDOMINAL, EPIGASTRIC 05/15/2007  . REDUCTION MAMMOPLASTY, HX OF 05/15/2007  . PSTPRC STATUS, BARIATRIC SURGERY 05/15/2007   PCP:  Donato SchultzLowne Chase, [name], DO Pharmacy:   [facility] [place], [place] - [address] [place] [id number] Phone: [phone] Fax: [phone]     Social Determinants of Health (SDOH) Interventions    Readmission Risk Interventions No flowsheet data found.

## 2019-06-03 NOTE — Progress Notes (Signed)
Physical Therapy Treatment Patient Details Name: Molly HawthorneRita C Fischer MRN: [id number] DOB: 01-20-1946 Today's Date: 06/03/2019    History of Present Illness Pt is a 73 y.o. female with recent admission 04/17/19 with left comminuted intra-articular distal humerus fx/supracondylar humerus fx S/p L humeral ORIF, olecranon fixation, ulnar nerve decompression and flexor pronator release on 6/9. Pt admitted 05/31/19 after fall sustaining femur fx and currently s/p right IM nail.  PMH includes HTN, chronic back pain, ADD, anxiety, RUE crus injury (1990s), fibromyalgia.    PT Comments    Pt cooperative but very apprehensive regarding attempts to mobilize.  This session, pt able to transition to standing with assist of 2 and to stand with assist and use of LBQC for balance.  Pt tolerating min WB on R LE and unable to advance L LE. Transfer to chair deferred at pt request - pt fatigues easily and becoming shaky.  Follow Up Recommendations  SNF;CIR     Equipment Recommendations  Wheelchair (measurements PT);Wheelchair cushion (measurements PT)    Recommendations for Other Services       Precautions / Restrictions Precautions Precautions: Fall Required Braces or Orthoses: Splint/Cast Splint/Cast: admitted with L UE splint Restrictions Weight Bearing Restrictions: Yes LUE Weight Bearing: Non weight bearing RLE Weight Bearing: Weight bearing as tolerated Other Position/Activity Restrictions: pt reports doctor stated NWB in LUE    Mobility  Bed Mobility Overal bed mobility: Needs Assistance Bed Mobility: Supine to Sit;Sit to Supine     Supine to sit: Mod assist;+2 for physical assistance;+2 for safety/equipment Sit to supine: Mod assist;+2 for physical assistance;+2 for safety/equipment   General bed mobility comments: increased time needed with cues for sequencing, pt frequently bearing weight through LUE despite WB status; leaning to the L to offload R hip pain  Transfers Overall transfer  level: Needs assistance Equipment used: Quad cane Transfers: Sit to/from Stand Sit to Stand: Mod assist;+2 physical assistance;+2 safety/equipment         General transfer comment: cues for hand placement and positioning of BLEs and to maintain NWB of LUE.  Physical assist to bring wt up and fwd and to balance in standing  Ambulation/Gait             General Gait Details: [facility].  Pt able to advance R LE and bring back under but toleraing very limited WB on R LE and unable to step with L LE    Stairs             Wheelchair Mobility    Modified Rankin (Stroke Patients Only)       Balance Overall balance assessment: Needs assistance;History of Falls Sitting-balance support: Feet supported;Single extremity supported Sitting balance-Leahy Scale: Fair     Standing balance support: Single extremity supported;During functional activity Standing balance-Leahy Scale: Poor Standing balance comment: reliant on ext support                            Cognition Arousal/Alertness: Awake/alert Behavior During Therapy: WFL for tasks assessed/performed Overall Cognitive Status: Within Functional Limits for tasks assessed                                        Exercises      General Comments        Pertinent Vitals/Pain Pain Assessment: 0-10 Pain Score: 7  Pain Location: R hip/thigh Pain Descriptors / Indicators: Sore;Aching;Guarding Pain Intervention(s): Limited activity within patient's tolerance;Monitored during session;Premedicated before session;Ice applied    Home Living Family/patient expects to be discharged to:: Private residence Living Arrangements: Spouse/significant other;Other relatives Available Help at Discharge: Family;Available 24 hours/day Type of Home: Other(Comment)(townhouse) Home Access: Stairs to enter Entrance Stairs-Rails: None Home Layout: Two level;Able to live on main level with  bedroom/bathroom Home Equipment: Gilford Rile - 2 wheels;Cane - single point;Grab bars - toilet;Hand held shower head;Shower seat Additional Comments: Husband in good shape to provide physical assist if needed. They care for 8-y.o. granddaughter    Prior Function Level of Independence: Independent with assistive device(s)      Comments: was using SPC for mobility, fall in bathroom leading to this admission, husband assists as needed, pt typically does not perform stairs. States she is able to manage ind bathing/dressing   PT Goals (current goals can now be found in the care plan section) Acute Rehab PT Goals Patient Stated Goal: to get better PT Goal Formulation: With patient Time For Goal Achievement: 06/22/19 Potential to Achieve Goals: Good Progress towards PT goals: Progressing toward goals    Frequency    Min 4X/week      PT Plan Current plan remains appropriate    Co-evaluation PT/OT/SLP Co-Evaluation/Treatment: Yes Reason for Co-Treatment: Complexity of the patient's impairments (multi-system involvement);For patient/therapist safety PT goals addressed during session: Mobility/safety with mobility OT goals addressed during session: ADL's and self-care      AM-PAC PT "6 Clicks" Mobility   Outcome Measure  Help needed turning from your back to your side while in a flat bed without using bedrails?: Total Help needed moving from lying on your back to sitting on the side of a flat bed without using bedrails?: A Lot Help needed moving to and from a bed to a chair (including a wheelchair)?: Total Help needed standing up from a chair using your arms (e.g., wheelchair or bedside chair)?: A Lot Help needed to walk in hospital room?: Total Help needed climbing 3-5 steps with a railing? : Total 6 Click Score: 8    End of Session Equipment Utilized During Treatment: Gait belt Activity Tolerance: Patient limited by pain;Patient limited by fatigue Patient left: in bed;with bed alarm  set;with call bell/phone within reach Nurse Communication: Mobility status;Other (comment) PT Visit Diagnosis: History of falling (Z91.81);Other abnormalities of gait and mobility (R26.89)     Time: 1450-1525 PT Time Calculation (min) (ACUTE ONLY): 35 min  Charges:  $Therapeutic Activity: 8-22 mins                     [place] Pager [phone] Office [phone]    [facility] 06/03/2019, 4:34 PM

## 2019-06-03 NOTE — Progress Notes (Signed)
Rehab Admissions Coordinator Note:  Patient was screened by Cleatrice Burke for appropriateness for an Inpatient Acute Rehab admission per PT recommendation. Patient lacks the medical neccesity for an inpt hospital rehab. She needs rehab, but not medically necessary for hospital rehab.   At this time, we are recommending [place] vs home with [facility] and family support pending patient preference.Cleatrice Burke RN MSN 06/03/2019, 9:40 AM  I can be reached at [phone].

## 2019-06-04 ENCOUNTER — Encounter (HOSPITAL_COMMUNITY): Payer: Self-pay | Admitting: Orthopedic Surgery

## 2019-06-04 LAB — CBC
HCT: 28.1 % — ABNORMAL LOW (ref 36.0–46.0)
Hemoglobin: 8.6 g/dL — ABNORMAL LOW (ref 12.0–15.0)
MCH: 31.6 pg (ref 26.0–34.0)
MCHC: 30.6 g/dL (ref 30.0–36.0)
MCV: 103.3 fL — ABNORMAL HIGH (ref 80.0–100.0)
Platelets: 303 10*3/uL (ref 150–400)
RBC: 2.72 MIL/uL — ABNORMAL LOW (ref 3.87–5.11)
RDW: 13.2 % (ref 11.5–15.5)
WBC: 5.5 10*3/uL (ref 4.0–10.5)
nRBC: 0 % (ref 0.0–0.2)

## 2019-06-04 MED ORDER — BISMUTH SUBSALICYLATE 262 MG/15ML PO SUSP
30.0000 mL | ORAL | Status: DC | PRN
  Administered 2019-06-04: 30 mL via ORAL
  Filled 2019-06-04: qty 236

## 2019-06-04 MED ORDER — ASPIRIN 325 MG PO TBEC: 325.0000 mg | DELAYED_RELEASE_TABLET | Freq: Every day | ORAL | 0 refills | Status: DC

## 2019-06-04 MED ORDER — ASPIRIN EC 81 MG PO TBEC: 81.0000 mg | DELAYED_RELEASE_TABLET | Freq: Every day | ORAL | 0 refills | Status: AC

## 2019-06-04 MED ORDER — ASPIRIN 325 MG PO TBEC: 325.0000 mg | DELAYED_RELEASE_TABLET | Freq: Every day | ORAL | 0 refills | Status: AC

## 2019-06-04 NOTE — Discharge Summary (Signed)
. Physician Discharge Summary  Burnard HawthorneRita C Lamos WUJ:[id number] DOB: 15-Oct-1946 DOA: 05/31/2019  PCP: Donato SchultzLowne Chase, [name], DO  Admit date: 05/31/2019 Discharge date: 06/04/2019  Admitted From: Home Disposition:  Discharge to home with HHPT  Recommendations for Outpatient Follow-up:  1. Follow up with PCP in 1-2 weeks 2. Please obtain BMP/CBC in one week 3. Follow up with Orthopedics as scheduled.   Discharge Condition: Stable  CODE STATUS: FULL   Brief/Interim Summary: Molly NgRita C Parkeris a 73 y.o.femalewith medical history significant fordepression with anxiety, left ureteral stricture with poorly functioning left kidney status post left nephrectomy, history of gastric bypass, and chronic back pain who [facility] for evaluation ofright hip pain.  Patient states on 05/26/2019 she was getting up from a toilet when she lost her balance. She tried to sit back down but missed and landed to the left side of the toilet landing on the floor onher right buttocks. She has had persistent pain and difficulty with ambulation since then. Patient states she initially felt this was a deep bruise therefore did not seek immediate medical attention. She has had persistent pain and has been unable to bear weight therefore presented to the ED for further evaluation. Patient states she normally ambulates with the use of a cane. She denies any associated chest pain, palpitations, dyspnea, lightheadedness, dizziness, abdominal pain, dysuria, or loss of consciousness.  Patient recently had a left elbow intra-articular supracondylar humerus fracture status post ORIF with olecranon osteotomy and ulnar nerve release on 04/17/2019. She has a left arm brace in place and states she has been recovering well.  Discharge Diagnoses:  Principal Problem:   Closed nondisplaced intertrochanteric fracture of right femur (HCC) Active Problems:   DEPRESSION/ANXIETY  Acute intertrochanteric  fracture of the right hip: - Occurred after a mechanical fall on 05/26/2019. - Patient has a 3.9% preoperative 30-day risk for death, MI, or cardiac arrest based on the revised cardiac risk index. Discussed with orthopedics, Dr. Shon BatonBrooks, who advised that patient will likely undergo ORIF by Dr. Lequita HaltAluisio on 06/01/2019. - Orthopedics consulted, plan for OR 06/01/2019 - Continue pain control with home OxyIR as needed and IV morphine as needed - now s/pRight intertrochanteric IMN  - PT to eval (rec'ing SNF vs CIR)     - CIR team rec's SNF vs HHPT; case management consulted     - HHPT set up. Wheelchair ordered     - per ortho: DVT PPx w/ 3 weeks of ASA 325mg  daily, then 3 weeks of ASA 81mg  daily.  Depression with anxiety: - Continue home venlafaxine XR and as needed clonazepam  Chronic back pain: - Continue home OxyIR 15 mg 4 times daily as needed and amitriptyline  ADD: - Continue home Adderall XR  Macrocytic anemia     - B12 ok; folate   Discharge Instructions   Allergies as of 06/04/2019      Reactions   Adhesive [tape] Itching      Medication List    TAKE these medications   acetaminophen 500 MG tablet Commonly known as: TYLENOL Take 2,000 mg by mouth 3 (three) times daily as needed for moderate pain.   amitriptyline 75 MG tablet Commonly known as: ELAVIL Take 75 mg by mouth at bedtime.   amphetamine-dextroamphetamine 20 MG 24 hr capsule Commonly known as: ADDERALL XR TAKE TWO CAPSULE BY MOUTH EVERY MORNING   aspirin 325 MG EC tablet Take 1 tablet (325 mg total) by mouth daily with breakfast for  21 days. Start taking on: June 05, 2019   aspirin EC 81 MG tablet Take 1 tablet (81 mg total) by mouth daily for 21 days. Start taking on: June 27, 2019   clonazePAM 0.5 MG tablet Commonly known as: KLONOPIN 1/2 po bid prn What changed:   how much to take  how to take this  when to take this  reasons to take  this  additional instructions   furosemide 40 MG tablet Commonly known as: LASIX Take 1 tablet (40 mg total) by mouth daily as needed for fluid or edema.   neomycin-bacitracin-polymyxin ointment Commonly known as: NEOSPORIN Apply 1 application topically daily as needed for wound care.   oxyCODONE 15 MG immediate release tablet Commonly known as: ROXICODONE Take 1 tablet (15 mg total) by mouth 4 (four) times daily. What changed: when to take this   SUMAtriptan 50 MG tablet Commonly known as: IMITREX Take 1 tablet (50 mg total) by mouth every 2 (two) hours as needed for migraine or headache. Max 2 doses in 24 hours   venlafaxine XR 75 MG 24 hr capsule Commonly known as: EFFEXOR-XR Take 2 capsules (150 mg total) by mouth daily.            Durable Medical Equipment  (From admission, onward)         Start     Ordered   06/04/19 0810  For home use only DME lightweight manual wheelchair with seat cushion  Once    Comments: Patient suffers from femur fracture, gait instability which impairs their ability to perform daily activities like mobilizing in the home.  A walker will not resolve  issue with performing activities of daily living. A wheelchair will allow patient to safely perform daily activities. Patient is not able to propel themselves in the home using a standard weight wheelchair due to generalized weakness. Patient can self propel in the lightweight wheelchair. Length of need 6 months. Accessories: elevating leg rests (ELRs), wheel locks, extensions and anti-tippers.   06/04/19 0813          Allergies  Allergen Reactions  . Adhesive [Tape] Itching    Consultations:  Orthopedics   Procedures/Studies: Ct Hip Left Wo Contrast  Result Date: 05/10/2019 CLINICAL DATA:  Left hip pain status post fall 3 weeks ago EXAM: CT OF THE LEFT HIP WITHOUT CONTRAST TECHNIQUE: Multidetector CT imaging of the left hip was performed according to the standard protocol.  Multiplanar CT image reconstructions were also generated. COMPARISON:  None. FINDINGS: Bones/Joint/Cartilage Generalized osteopenia. No fracture or dislocation. Normal alignment. No joint effusion. Mild osteoarthritis of the left hip. 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 Ligaments Ligaments are suboptimally evaluated by CT. Muscles and Tendons Muscles are normal.  No muscle atrophy. Soft tissue No fluid collection or hematoma.  No soft tissue mass. IMPRESSION: 1. No acute osseous injury of the left hip. Electronically Signed   By: Elige Ko   On: 05/10/2019 13:24   Dg C-arm 1-60 Min-no Report  Result Date: 06/01/2019 Fluoroscopy was utilized by the requesting physician.  No radiographic interpretation.   Dg Hip Unilat With Pelvis 2-3 Views Right  Result Date: 05/31/2019 CLINICAL DATA:  Rt hip pain with deformity s/p fall x 5 days ago. No old injury known. EXAM: DG HIP (WITH OR WITHOUT PELVIS) 2-3V RIGHT COMPARISON:  CT of the LEFT hip on 05/10/2019, portable pelvis on 08/21/2014 FINDINGS: There is an acute intertrochanteric fracture of the RIGHT hip, associated varus angulation and impaction at  the fracture site. No evidence for dislocation. Degenerative changes are seen in the LOWER spine. IMPRESSION: Acute intertrochanteric fracture of the RIGHT hip. Electronically Signed   By: Nolon Nations M.D.   On: 05/31/2019 14:09   Dg Femur, Min 2 Views Right  Result Date: 06/01/2019 CLINICAL DATA:  Known intratrochanteric fracture EXAM: RIGHT FEMUR 2 VIEWS COMPARISON:  Films from the previous day FLUOROSCOPY TIME:  Radiation Exposure Index (as provided by the fluoroscopic device): 14.56 mGy If the device does not provide the exposure index: Fluoroscopy Time:  1 minutes Number of Acquired Images:  5 FINDINGS: Initial images again demonstrate the intratrochanteric fracture with mild impaction. Proximal medullary rod with fixation screws is subsequently noted. The fracture fragments are in near  anatomic alignment. IMPRESSION: ORIF of proximal right femoral fracture. Electronically Signed   By: Inez Catalina M.D.   On: 06/01/2019 18:17      Subjective: "Oh, that would be good."  Discharge Exam: Vitals:   06/03/19 2048 06/04/19 0608  BP: 118/72 111/75  Pulse: 100 86  Resp: 18   Temp: 98.7 F (37.1 C) 98 F (36.7 C)  SpO2: 94% 95%   Vitals:   06/03/19 0543 06/03/19 1559 06/03/19 2048 06/04/19 0608  BP: 115/71 102/80 118/72 111/75  Pulse: 86 99 100 86  Resp: 14 18 18    Temp: 98.4 F (36.9 C)  98.7 F (37.1 C) 98 F (36.7 C)  TempSrc: Oral  Oral Oral  SpO2: 94% 94% 94% 95%  Weight:      Height:        General:73 y.o.femaleresting in bed in NAD Cardiovascular: RRR, +S1, S2, no m/g/r, equal pulses throughout Respiratory: CTABL, no w/r/r, normal WOB GI: BS+, NDNT, no masses noted, no organomegaly noted MSK: No e/c/c Neuro: Alert to name, follows commands    The results of significant diagnostics from this hospitalization (including imaging, microbiology, ancillary and laboratory) are listed below for reference.     Microbiology: Recent Results (from the past 240 hour(s))  SARS Coronavirus 2 (Performed in [place] hospital lab)     Status: None   Collection Time: 05/31/19  2:42 PM   Specimen: Nasopharyngeal Swab  Result Value Ref Range Status   SARS Coronavirus 2 NEGATIVE NEGATIVE Final    Comment: (NOTE) If result is NEGATIVE SARS-CoV-2 target nucleic acids are NOT DETECTED. The SARS-CoV-2 RNA is generally detectable in upper and lower  respiratory specimens during the acute phase of infection. The lowest  concentration of SARS-CoV-2 viral copies this assay can detect is 250  copies / mL. A negative result does not preclude SARS-CoV-2 infection  and should not be used as the sole basis for treatment or other  patient management decisions.  A negative result may occur with  improper specimen collection / handling, submission of specimen other  than  nasopharyngeal swab, presence of viral mutation(s) within the  areas targeted by this assay, and inadequate number of viral copies  (<250 copies / mL). A negative result must be combined with clinical  observations, patient history, and epidemiological information. If result is POSITIVE SARS-CoV-2 target nucleic acids are DETECTED. The SARS-CoV-2 RNA is generally detectable in upper and lower  respiratory specimens dur ing the acute phase of infection.  Positive  results are indicative of active infection with SARS-CoV-2.  Clinical  correlation with patient history and other diagnostic information is  necessary to determine patient infection status.  Positive results do  not rule out bacterial infection or co-infection with other  viruses. If result is PRESUMPTIVE POSTIVE SARS-CoV-2 nucleic acids MAY BE PRESENT.   A presumptive positive result was obtained on the submitted specimen  and confirmed on repeat testing.  While 2019 novel coronavirus  (SARS-CoV-2) nucleic acids may be present in the submitted sample  additional confirmatory testing may be necessary for epidemiological  and / or clinical management purposes  to differentiate between  SARS-CoV-2 and other Sarbecovirus currently known to infect humans.  If clinically indicated additional testing with an alternate test  methodology [phone]) is advised. The SARS-CoV-2 RNA is generally  detectable in upper and lower respiratory sp ecimens during the acute  phase of infection. The expected result is Negative. Fact Sheet for Patients:  BoilerBrush.com.cyhttps://www.fda.gov/media/136312/download Fact Sheet for Healthcare Providers: https://pope.com/https://www.fda.gov/media/136313/download This test is not yet approved or cleared by the [place] FDA and has been authorized for detection and/or diagnosis of SARS-CoV-2 by FDA under an Emergency Use Authorization (EUA).  This EUA will remain in effect (meaning this test can be used) for the duration of  the COVID-19 declaration under Section 564(b)(1) of the Act, 21 U.S.C. section 360bbb-3(b)(1), unless the authorization is terminated or revoked sooner. Performed at [facility], [address] [place], [place] [address]   Surgical pcr screen     Status: None   Collection Time: 06/01/19  1:56 AM   Specimen: Nasal Mucosa; Nasal Swab  Result Value Ref Range Status   MRSA, PCR NEGATIVE NEGATIVE Final   Staphylococcus aureus NEGATIVE NEGATIVE Final    Comment: (NOTE) The Xpert SA Assay (FDA approved for NASAL specimens in patients 71 years of age and older), is one component of a comprehensive surveillance program. It is not intended to diagnose infection nor to guide or monitor treatment. Performed at [facility], [address] [place], [place] [address]      Labs: BNP (last 3 results) No results for input(s): BNP in the last 8760 hours. Basic Metabolic Panel: Recent Labs  Lab 05/31/19 1442 06/01/19 0322 06/02/19 0313 06/03/19 0319  NA 138 136 135 136  K 4.5 4.6 4.5 4.5  CL 105 102 101 104  CO2 24 24 23 24   GLUCOSE 93 85 143* 103*  BUN 21 20 17 22   CREATININE 1.07* 0.99 0.93 1.01*  CALCIUM 9.0 8.9  9.1 8.9 8.5*  MG  --   --   --  2.1   Liver Function Tests: No results for input(s): AST, ALT, ALKPHOS, BILITOT, PROT, ALBUMIN in the last 168 hours. No results for input(s): LIPASE, AMYLASE in the last 168 hours. No results for input(s): AMMONIA in the last 168 hours. CBC: Recent Labs  Lab 05/31/19 1442 06/01/19 0322 06/02/19 0313 06/03/19 0319 06/04/19 0258  WBC 5.7 5.4 5.2 5.8 5.5  HGB 12.4 12.7 11.7* 9.6* 8.6*  HCT 40.3 41.7 37.3 32.7* 28.1*  MCV 104.1* 104.5* 103.0* 108.3* 103.3*  PLT 242 190 300 279 303   Cardiac Enzymes: No results for input(s): CKTOTAL, CKMB, CKMBINDEX, TROPONINI in the last 168 hours. BNP: Invalid input(s): POCBNP CBG: No results for input(s): GLUCAP in the last 168 hours. D-Dimer No results for  input(s): DDIMER in the last 72 hours. Hgb A1c No results for input(s): HGBA1C in the last 72 hours. Lipid Profile No results for input(s): CHOL, HDL, LDLCALC, TRIG, CHOLHDL, LDLDIRECT in the last 72 hours. Thyroid function studies No results for input(s): TSH, T4TOTAL, T3FREE, THYROIDAB in the last 72 hours.  Invalid input(s): FREET3 Anemia work up Recent  Labs    06/03/19 1449  VITAMINB12 572   Urinalysis    Component Value Date/Time   COLORURINE YELLOW 12/07/2018 1908   APPEARANCEUR HAZY (A) 12/07/2018 1908   LABSPEC 1.024 12/07/2018 1908   PHURINE 5.0 12/07/2018 1908   GLUCOSEU NEGATIVE 12/07/2018 1908   HGBUR NEGATIVE 12/07/2018 1908   HGBUR negative 10/28/2009 0000   BILIRUBINUR NEGATIVE 12/07/2018 1908   BILIRUBINUR negative 03/24/2015 1615   KETONESUR NEGATIVE 12/07/2018 1908   PROTEINUR 100 (A) 12/07/2018 1908   UROBILINOGEN 2.0 03/24/2015 1615   UROBILINOGEN 1.0 03/06/2015 2030   NITRITE NEGATIVE 12/07/2018 1908   LEUKOCYTESUR MODERATE (A) 12/07/2018 1908   Sepsis Labs Invalid input(s): PROCALCITONIN,  WBC,  LACTICIDVEN Microbiology Recent Results (from the past 240 hour(s))  SARS Coronavirus 2 (Performed in [facility] lab)     Status: None   Collection Time: 05/31/19  2:42 PM   Specimen: Nasopharyngeal Swab  Result Value Ref Range Status   SARS Coronavirus 2 NEGATIVE NEGATIVE Final    Comment: (NOTE) If result is NEGATIVE SARS-CoV-2 target nucleic acids are NOT DETECTED. The SARS-CoV-2 RNA is generally detectable in upper and lower  respiratory specimens during the acute phase of infection. The lowest  concentration of SARS-CoV-2 viral copies this assay can detect is 250  copies / mL. A negative result does not preclude SARS-CoV-2 infection  and should not be used as the sole basis for treatment or other  patient management decisions.  A negative result may occur with  improper specimen collection / handling, submission of specimen other  than  nasopharyngeal swab, presence of viral mutation(s) within the  areas targeted by this assay, and inadequate number of viral copies  (<250 copies / mL). A negative result must be combined with clinical  observations, patient history, and epidemiological information. If result is POSITIVE SARS-CoV-2 target nucleic acids are DETECTED. The SARS-CoV-2 RNA is generally detectable in upper and lower  respiratory specimens dur ing the acute phase of infection.  Positive  results are indicative of active infection with SARS-CoV-2.  Clinical  correlation with patient history and other diagnostic information is  necessary to determine patient infection status.  Positive results do  not rule out bacterial infection or co-infection with other viruses. If result is PRESUMPTIVE POSTIVE SARS-CoV-2 nucleic acids MAY BE PRESENT.   A presumptive positive result was obtained on the submitted specimen  and confirmed on repeat testing.  While 2019 novel coronavirus  (SARS-CoV-2) nucleic acids may be present in the submitted sample  additional confirmatory testing may be necessary for epidemiological  and / or clinical management purposes  to differentiate between  SARS-CoV-2 and other Sarbecovirus currently known to infect humans.  If clinically indicated additional testing with an alternate test  methodology [phone]([id number]) is advised. The SARS-CoV-2 RNA is generally  detectable in upper and lower respiratory sp ecimens during the acute  phase of infection. The expected result is Negative. Fact Sheet for Patients:  BoilerBrush.com.cyhttps://www.fda.gov/media/136312/download Fact Sheet for Healthcare Providers: https://pope.com/https://www.fda.gov/media/136313/download This test is not yet approved or cleared by the [place] FDA and has been authorized for detection and/or diagnosis of SARS-CoV-2 by FDA under an Emergency Use Authorization (EUA).  This EUA will remain in effect (meaning this test can be used) for the duration of  the COVID-19 declaration under Section 564(b)(1) of the Act, 21 U.S.C. section 360bbb-3(b)(1), unless the authorization is terminated or revoked sooner. Performed at [facility], [address] [place], [place] [address]  Surgical pcr screen     Status: None   Collection Time: 06/01/19  1:56 AM   Specimen: Nasal Mucosa; Nasal Swab  Result Value Ref Range Status   MRSA, PCR NEGATIVE NEGATIVE Final   Staphylococcus aureus NEGATIVE NEGATIVE Final    Comment: (NOTE) The Xpert SA Assay (FDA approved for NASAL specimens in patients 67 years of age and older), is one component of a comprehensive surveillance program. It is not intended to diagnose infection nor to guide or monitor treatment. Performed at [facility], [address] [place], [place] [address]      Time coordinating discharge: 35 minutes  SIGNED:   Teddy Spike, DO  [facility] 06/04/2019, 8:08 AM Pager   If 7PM-7AM, please contact night-coverage [email or web address] Password [email or web address]

## 2019-06-04 NOTE — TOC Progression Note (Addendum)
Transition of Care [facility]) - Progression Note    Patient Details  Name: [name] MRN: [id number] Date of Birth: 1946-08-17  Transition of Care [facility]) CM/SW Contact  Leeroy Cha, RN Phone Number: 06/04/2019, 9:48 AM  Clinical Narrative:    Per patient she is now to go home with husband and daughter who is coming from [place] to help with the home care.  Request for home P.T. sent to [facility] [id number]/tct-ptar for pickup to go home.  Expected Discharge Plan: [place] Barriers to Discharge: Family Issues  Expected Discharge Plan and Services Expected Discharge Plan: [place] In-house Referral: Clinical Social Work   Post Acute Care Choice: [place] arrangements for the past 2 months: [place]: PT [place]: [facility] [employer] Date [place]: 06/04/19 Time [place]: [phone] Representative spoke with at [place]: [place] ([place]) Interventions    Readmission Risk Interventions No flowsheet data found.

## 2019-06-04 NOTE — Progress Notes (Signed)
Pt was discharged. No acute distress. Pt alert and responsive. Belongings returned. W/c left by nurses station, will be pick up by family member 7/28.

## 2019-06-04 NOTE — Progress Notes (Signed)
    Durable Medical Equipment  (From admission, onward)         Start     Ordered   06/04/19 0810  For home use only DME lightweight manual wheelchair with seat cushion  Once    Comments: Patient suffers from femur fracture, gait instability which impairs their ability to perform daily activities like mobilizing in the home.  A walker will not resolve  issue with performing activities of daily living. A wheelchair will allow patient to safely perform daily activities. Patient is not able to propel themselves in the home using a standard weight wheelchair due to generalized weakness. Patient can self propel in the lightweight wheelchair. Length of need 6 months. Accessories: elevating leg rests (ELRs), wheel locks, extensions and anti-tippers.   06/04/19 0813

## 2019-06-04 NOTE — Progress Notes (Signed)
Physical Therapy Treatment Patient Details Name: [name] MRN: [id number] DOB: April 11, 1946 Today's Date: 06/04/2019    History of Present Illness Pt is a 73 y.o. female with recent admission 04/17/19 with left comminuted intra-articular distal humerus fx/supracondylar humerus fx S/p L humeral ORIF, olecranon fixation, ulnar nerve decompression and flexor pronator release on 6/9. Pt admitted 05/31/19 after fall sustaining femur fx and currently s/p right IM nail.  PMH includes HTN, chronic back pain, ADD, anxiety, RUE crus injury (1990s), fibromyalgia.    PT Comments    POD # 3 Assisted OOB to [facility].  General bed mobility comments: great difficulty scooting due to inability to use L UE and swelling/pain R hip.  Utilized bed pad to complete.  General transfer comment: instructed pt how to "stand pivot sit" 1/4 turn towards her RIGHT which is her hip side but is her good arm side.  Since pt is WBAT advised her :transfers to RIGHT" would be ideal.  Plus advised her NOT to use any AD such that RIGHT hand could steady herself and transfer more easily to control and sit.  Pt tolerated full weight B LE and was able to complete pivot steps from bed to [facility].  During sit to stand pt DOES require assist with peri care as she is unable to balance self on just her 2 legs holding nothing as R UE attempts to perform.General Gait Details: transfers only performed this session    Follow Up Recommendations  SNF;CIR;Other (comment)(CIR has declined and pt ref SNF.  Pt plans to D/C to home)     Equipment Recommendations  Wheelchair (measurements PT);Wheelchair cushion (measurements PT)    Recommendations for Other Services       Precautions / Restrictions Precautions Precautions: Fall Required Braces or Orthoses: Splint/Cast Splint/Cast: admitted with L UE splint Restrictions Weight Bearing Restrictions: Yes LUE Weight Bearing: Non weight bearing RLE Weight Bearing: Weight bearing as tolerated Other  Position/Activity Restrictions: pt reports doctor stated NWB in LUE    Mobility  Bed Mobility Overal bed mobility: Needs Assistance Bed Mobility: Supine to Sit     Supine to sit: Mod assist;Max assist     General bed mobility comments: great difficulty scooting due to inability to use L UE and swelling/pain R hip.  Utilized bed pad to complete  Transfers Overall transfer level: Needs assistance Equipment used: None Transfers: Stand Pivot Transfers Sit to Stand: Min assist;Mod assist Stand pivot transfers: Mod assist       General transfer comment: instructed pt how to "stand pivot sit" 1/4 turn towards her RIGHT which is her hip side but is her good arm side.  Since pt is WBAT advised her :transfers to RIGHT" would be ideal.  Plus advised her NOT to use any AD such that RIGHT hand could steady herself and transfer more easily to control and sit.  Pt tolerated full weight B LE and was able to complete pivot steps from bed to [facility].  During sit to stand pt DOES require assist with peri care as she is unable to balance self on just her 2 legs holding nothing as R UE attempts to perform.  Ambulation/Gait             General Gait Details: transfers only performed this session   Stairs             Wheelchair Mobility    Modified Rankin (Stroke Patients Only)       Balance  Cognition Arousal/Alertness: Awake/alert Behavior During Therapy: WFL for tasks assessed/performed Overall Cognitive Status: Within Functional Limits for tasks assessed                                        Exercises      General Comments        Pertinent Vitals/Pain Pain Assessment: 0-10 Pain Score: 5  Pain Location: R hip/thigh Pain Descriptors / Indicators: Sore;Aching;Guarding Pain Intervention(s): Monitored during session;Premedicated before session;Repositioned;Ice applied    Home Living                       Prior Function            PT Goals (current goals can now be found in the care plan section) Progress towards PT goals: Progressing toward goals    Frequency    Min 4X/week      PT Plan Current plan remains appropriate    Co-evaluation              AM-PAC PT "6 Clicks" Mobility   Outcome Measure    Help needed moving from lying on your back to sitting on the side of a flat bed without using bedrails?: A Lot Help needed moving to and from a bed to a chair (including a wheelchair)?: A Lot Help needed standing up from a chair using your arms (e.g., wheelchair or bedside chair)?: A Lot Help needed to walk in hospital room?: A Lot Help needed climbing 3-5 steps with a railing? : Total 6 Click Score: 9    End of Session Equipment Utilized During Treatment: Gait belt Activity Tolerance: Patient tolerated treatment well Patient left: in chair;with call bell/phone within reach;with chair alarm set Nurse Communication: Mobility status PT Visit Diagnosis: History of falling (Z91.81);Other abnormalities of gait and mobility (R26.89)     Time: [id number] PT Time Calculation (min) (ACUTE ONLY): 26 min  Charges:  $Therapeutic Activity: 23-37 mins                     Felecia ShellingLori Alicea Wente  PTA Acute  Rehabilitation Services Pager      [phone] Office      [phone]

## 2019-06-04 NOTE — Progress Notes (Signed)
OT Cancellation Note  Patient Details Name: [name] MRN: [id number] DOB: 09-12-1946   Cancelled Treatment:    Reason Eval/Treat Not Completed: Other (comment)  OT checked on pt earlier in day and pt not available. OT just checked on pt and pt waiting on PTAR for DC home. Recommend HHOT .  Spoke with RN  Kari Baars, [place] [phone] Office- [phone], Thereasa Parkin 06/04/2019, 5:25 PM

## 2019-06-04 NOTE — Care Management Important Message (Signed)
Important Message  Patient Details IM Letter given to Velva Harman RN to present to the Patient                                                                                     Name: [name] MRN: [id number] Date of Birth: 1946/04/15   Medicare Important Message Given:  Yes     Kerin Salen 06/04/2019, 11:47 AM

## 2019-06-05 ENCOUNTER — Other Ambulatory Visit: Payer: Self-pay

## 2019-06-05 ENCOUNTER — Telehealth: Payer: Self-pay | Admitting: *Deleted

## 2019-06-05 LAB — FOLATE RBC
Folate, Hemolysate: 432 ng/mL
Folate, RBC: 1485 ng/mL (ref >498)
Hematocrit: 29.1 % — ABNORMAL LOW (ref 34.0–46.6)

## 2019-06-05 NOTE — Telephone Encounter (Signed)
LVM for pt to call office

## 2019-06-06 NOTE — Telephone Encounter (Signed)
Unable to reach pt. Pt did schedule follow up appt.06/14/19.

## 2019-06-07 NOTE — Telephone Encounter (Signed)
Transition Care Management Follow-up Telephone Call   Date discharged?06/04/19   How have you been since you were released from the hospital? "I guess I'm doing pretty good. I start PT this Saturday."   Do you understand why you were in the hospital? yes   Do you understand the discharge instructions? yes   Where were you discharged to? Home. Husband is there and daughter came home from [place] to help out as well for 2 weeks.   Items Reviewed:  Medications reviewed: " I am just on a few more pain medications"  Allergies reviewed: yes  Dietary changes reviewed: yes  Referrals reviewed: yes   Functional Questionnaire:   Activities of Daily Living (ADLs):   She states they are independent in the following: feeding and continence States they require assistance with the following: ambulation, bathing and hygiene, grooming, toileting and dressing using walker   Any transportation issues/concerns?: no   Any patient concerns? no   Confirmed importance and date/time of follow-up visits scheduled yes  Provider Appointment booked with  PCP 06/14/19   Confirmed with patient if condition begins to worsen call PCP or go to the ER.  Patient was given the office number and encouraged to call back with question or concerns.  : yes

## 2019-06-12 ENCOUNTER — Other Ambulatory Visit: Payer: Self-pay | Admitting: Family Medicine

## 2019-06-12 DIAGNOSIS — F988 Other specified behavioral and emotional disorders with onset usually occurring in childhood and adolescence: Secondary | ICD-10-CM

## 2019-06-12 MED ORDER — AMPHETAMINE-DEXTROAMPHET ER 20 MG PO CP24: ORAL_CAPSULE | ORAL | 0 refills | Status: DC

## 2019-06-12 NOTE — Telephone Encounter (Signed)
Medication Refill - Medication:  amphetamine-dextroamphetamine (ADDERALL XR) 20 MG 24 hr capsule   Has the patient contacted their pharmacy? Yes advised to call PCP  Preferred Pharmacy (with phone number or street name):  [place], [place] - [address] [phone] (Phone) [phone] (Fax)   Agent: Please be advised that RX refills may take up to 3 business days. We ask that you follow-up with your pharmacy.

## 2019-06-12 NOTE — Telephone Encounter (Signed)
Adderall refill.   Last OV; 03/22/2019 Last Fill: 05/08/2019 #60 and 0RF UDS: 10/24/2018 Low risk

## 2019-06-14 ENCOUNTER — Inpatient Hospital Stay: Payer: Medicare Other | Admitting: Family Medicine

## 2019-06-21 ENCOUNTER — Inpatient Hospital Stay: Payer: Medicare Other | Admitting: Family Medicine

## 2019-06-28 ENCOUNTER — Encounter: Payer: Self-pay | Admitting: Family Medicine

## 2019-06-28 ENCOUNTER — Other Ambulatory Visit: Payer: Self-pay

## 2019-06-28 ENCOUNTER — Ambulatory Visit (INDEPENDENT_AMBULATORY_CARE_PROVIDER_SITE_OTHER): Payer: Medicare Other | Admitting: Family Medicine

## 2019-06-28 DIAGNOSIS — S72144A Nondisplaced intertrochanteric fracture of right femur, initial encounter for closed fracture: Secondary | ICD-10-CM

## 2019-06-28 DIAGNOSIS — S42409A Unspecified fracture of lower end of unspecified humerus, initial encounter for closed fracture: Secondary | ICD-10-CM

## 2019-06-28 DIAGNOSIS — F419 Anxiety disorder, unspecified: Secondary | ICD-10-CM

## 2019-06-28 DIAGNOSIS — D649 Anemia, unspecified: Secondary | ICD-10-CM

## 2019-06-28 MED ORDER — CLONAZEPAM 0.5 MG PO TABS: 0.2500 mg | ORAL_TABLET | Freq: Two times a day (BID) | ORAL | 1 refills | Status: DC | PRN

## 2019-06-28 NOTE — Progress Notes (Signed)
Virtual Visit via Video Note  I connected with [name] on 06/28/19 at 10:00 AM EDT by a video enabled telemedicine application and verified that I am speaking with the correct person using two identifiers.  Location: Patient: home Provider: office    I discussed the limitations of evaluation and management by telemedicine and the availability of in person appointments. The patient expressed understanding and agreed to proceed.  History of Present Illness: Pt is home recovering from hip fracture  after a fall in her bathroom.  She was admitted 7/23-d/c 7/27 Prior to that fall she fractured her elbow   Observations/Objective: No vitals obtained Pt is in NAD  Assessment and Plan: 1. Anxiety Slightly worsened --- increase frequency temporarily  - clonazePAM (KLONOPIN) 0.5 MG tablet; Take 0.5-1 tablets (0.25-0.5 mg total) by mouth 2 (two) times daily as needed for anxiety.  Dispense: 60 tablet; Refill: 1  2. Anemia, unspecified type Recheck labs  - CBC with Differential/Platelet; Future - Basic metabolic panel; Future  3. Closed nondisplaced intertrochanteric fracture of right femur, initial encounter ([place]) F/u ortho  4. Closed fracture of elbow, unspecified laterality, initial encounter F/u ortho   Follow Up Instructions:    I discussed the assessment and treatment plan with the patient. The patient was provided an opportunity to ask questions and all were answered. The patient agreed with the plan and demonstrated an understanding of the instructions.   The patient was advised to call back or seek an in-person evaluation if the symptoms worsen or if the condition fails to improve as anticipated.  I provided 15 minutes of non-face-to-face time during this encounter.   Ann Held, DO

## 2019-07-11 ENCOUNTER — Other Ambulatory Visit: Payer: Self-pay | Admitting: Family Medicine

## 2019-07-11 DIAGNOSIS — F988 Other specified behavioral and emotional disorders with onset usually occurring in childhood and adolescence: Secondary | ICD-10-CM

## 2019-07-11 NOTE — Telephone Encounter (Signed)
Medication Refill - Medication: amphetamine-dextroamphetamine (ADDERALL XR) 20 MG 24 hr capsule    Has the patient contacted their pharmacy? Yes.   (Agent: If no, request that the patient contact the pharmacy for the refill.) (Agent: If yes, when and what did the pharmacy advise?)  Preferred Pharmacy (with phone number or street name):  [place], [place] - [address]  [address] [address]  Phone: [phone] Fax: [phone]     Agent: Please be advised that RX refills may take up to 3 business days. We ask that you follow-up with your pharmacy.

## 2019-07-12 MED ORDER — AMPHETAMINE-DEXTROAMPHET ER 20 MG PO CP24: ORAL_CAPSULE | ORAL | 0 refills | Status: DC

## 2019-07-12 NOTE — Telephone Encounter (Signed)
Last adderall XR RX: 06/12/19, #60 Last OV: 06/28/19 Next OV: 09/25/19 UDS: 10/24/18, was due 04/25/19 CSC: 02/07/18, due now

## 2019-08-06 ENCOUNTER — Other Ambulatory Visit: Payer: Self-pay | Admitting: Family Medicine

## 2019-08-06 DIAGNOSIS — R519 Headache, unspecified: Secondary | ICD-10-CM

## 2019-08-09 ENCOUNTER — Ambulatory Visit (INDEPENDENT_AMBULATORY_CARE_PROVIDER_SITE_OTHER): Payer: Medicare Other | Admitting: Internal Medicine

## 2019-08-09 DIAGNOSIS — G259 Extrapyramidal and movement disorder, unspecified: Secondary | ICD-10-CM | POA: Diagnosis not present

## 2019-08-09 NOTE — Progress Notes (Signed)
Subjective:    Patient ID: [name], female    DOB: Jan 12, 1946, 73 y.o.   MRN: [id number]  DOS:  08/09/2019 Type of visit - description: Virtual Visit via Video Note  I connected with@   by a video enabled telemedicine application and verified that I am speaking with the correct person using two identifiers.   THIS ENCOUNTER IS A VIRTUAL VISIT DUE TO COVID-19 - PATIENT WAS NOT SEEN IN THE OFFICE. PATIENT HAS CONSENTED TO VIRTUAL VISIT / TELEMEDICINE VISIT   Location of patient: home  Location of provider: office  I discussed the limitations of evaluation and management by telemedicine and the availability of in person appointments. The patient expressed understanding and agreed to proceed.  History of Present Illness: Acute Her chief complaint is a movement disorder. States that she had this problem for a while but is getting worse. Over the last 2 days is more noticeable:  on and off throughout the day. Reports that her arms, legs, head "jump" involuntarily. Also describes her symptoms as trembling when she tries to walk.  Denies loss of consciousness, tongue biting, bladder or bowel incontinence. No history of a seizure disorder or epilepsy.    Review of Systems  Denies fever chills No chest pain no difficulty breathing No rash No dysuria or cough No dizziness. Occasional nausea but no vomiting.  Past Medical History:  Diagnosis Date  . Abrasion of right heel    during admission 10/ 2019 right heel open skin due to movement on sheet  . ADD (attention deficit disorder)   . Anemia, mild   . Anxiety   . Arthritis    "joints, back"  . At high risk for falls    10-19-2018 pt has fell twice in past week, tripped  . Chronic back pain    "all over my back" (10/02/2013)  . Crushing injury of arm, right 02/06/1989's   "it was crushed; wore cast from fingers to top of my shoulder" (11/25/2  . Crushing injury of left wrist 05/11/1989's  . DOE (dyspnea on exertion)   .  Essential tremor   . Fibromyalgia   . History of palpitations 2002   recurrent  . History of panic attacks   . History of pneumonia 08/21/2018   w/ acute respiratory failure/ hypoxia  . History of sepsis    admission 08-21-2018  secondary to uropathy obstructive from ureteral stone   . Hyperlipidemia   . Hypertension    10-19-2018 PER PT AMLODIPINE ON HOLD SINCE 10/ 2019 DUE TO KIDNEY ISSUES PER DOCTOR  . Idiopathic scoliosis 04/19/2013  . Left ureteral stone   . Migraines    "once/month" (10/02/2013)  . Osteoporosis   . Other vitamin B12 deficiency anemia   . PONV (postoperative nausea and vomiting)    As a child  . Pulmonary nodules/lesions, multiple   . Renal insufficiency   . S/P gastric bypass 09/16/2003  . S/P insertion of spinal cord stimulator    per pt remote is missing  . Wears dentures    upper  . Wears glasses   . Wears glasses     Past Surgical History:  Procedure Laterality Date  . ABDOMINAL HYSTERECTOMY  1980's   W/  BSO  AND APPENDECTOMY  . CARDIAC CATHETERIZATION  12-13-2002    dr Eden Emmsnishan   normal coronaries  . CYSTOSCOPY WITH URETEROSCOPY AND STENT PLACEMENT Left 09/27/2018   Procedure: LEFT  URETEROSCOPY, HOLMIUM LASER LITHOTRIPSY;  Surgeon: Crist FatHerrick, Benjamin W, MD;  Location: [facility];  Service: Urology;  Laterality: Left;  . D & C HYSTERSCOPY RESECTION MYOMECTOMY  1980s  . HUMERUS FRACTURE SURGERY Left 04/17/2019    Comminuted complex left intra-articular distal humerus fracture/supracondylar humerus fracture displaced  . INTRAMEDULLARY (IM) NAIL INTERTROCHANTERIC Right 06/01/2019   Procedure: INTRAMEDULLARY (IM) NAIL INTERTROCHANTRIC;  Surgeon: Gaynelle Arabian, MD;  Location: [facility];  Service: Orthopedics;  Laterality: Right;  . IR NEPHROSTOMY PLACEMENT LEFT  08/22/2018  . IR NEPHROSTOMY PLACEMENT LEFT  12/08/2018  . KNEE ARTHROSCOPY Right 03-31-2001    dr Shellia Carwin @[facility]   . NEPHROLITHOTOMY Left 10/20/2018   Procedure: LEFT NEPHROLITHOTOMY PERCUTANEOUS;   Surgeon: Ardis Hughs, MD;  Location: [facility];  Service: Urology;  Laterality: Left;  . ORIF HUMERUS FRACTURE Left 04/17/2019   Procedure: Open reduction internal fixation left supracondylar humerus fracture with olecranon osteotomy and ulnar nerve release and anterior transposition and repair of structures as necessary;  Surgeon: Roseanne Kaufman, MD;  Location: [facility];  Service: Orthopedics;  Laterality: Left;  3 hrs  . REDUCTION MAMMAPLASTY Bilateral 2001  . ROBOT ASSISTED LAPAROSCOPIC NEPHRECTOMY Left 12/25/2018   Procedure: XI ROBOTIC ASSISTED LAPAROSCOPIC NEPHRECTOMY, LEFT FLANK EXPLORATION WITH REMOVAL OF BATTERY PACK;  Surgeon: Ardis Hughs, MD;  Location: [facility];  Service: Urology;  Laterality: Left;  . ROUX-EN-Y GASTRIC BYPASS  09-16-2003   dr Jerilynn Mages. Hassell Done  @[facility]    via Laparoscopy w/ gastrojejunostomy  . SPINAL CORD STIMULATOR IMPLANT  2016   10-19-2018  PER PT HAS REMOTE BUT HAS NOT USED IT SINCE DEC 2018  . TONSILLECTOMY AND ADENOIDECTOMY  1955  . TUBAL LIGATION  1980's    Social History   Socioeconomic History  . Marital status: Married    Spouse name: Not on file  . Number of children: 2  . Years of education: Not on file  . Highest education level: Not on file  Occupational History  . Occupation: Retired  Scientific laboratory technician  . Financial resource strain: Not on file  . Food insecurity    Worry: Not on file    Inability: Not on file  . Transportation needs    Medical: Not on file    Non-medical: Not on file  Tobacco Use  . Smoking status: Never Smoker  . Smokeless tobacco: Never Used  Substance and Sexual Activity  . Alcohol use: No  . Drug use: No  . Sexual activity: Never    Partners: Male  Lifestyle  . Physical activity    Days per week: Not on file    Minutes per session: Not on file  . Stress: Not on file  Relationships  . Social Herbalist on phone: Not on file    Gets together: Not on file    Attends religious service: Not on file     Active member of club or organization: Not on file    Attends meetings of clubs or organizations: Not on file    Relationship status: Not on file  . Intimate partner violence    Fear of current or ex partner: Not on file    Emotionally abused: Not on file    Physically abused: Not on file    Forced sexual activity: Not on file  Other Topics Concern  . Not on file  Social History Narrative  . Not on file      Allergies as of 08/09/2019      Reactions   Adhesive [tape] Itching      Medication List  Accurate as of August 09, 2019  4:17 PM. If you have any questions, ask your nurse or doctor.        acetaminophen 500 MG tablet Commonly known as: TYLENOL Take 2,000 mg by mouth 3 (three) times daily as needed for moderate pain.   amitriptyline 75 MG tablet Commonly known as: ELAVIL Take 75 mg by mouth at bedtime.   amphetamine-dextroamphetamine 20 MG 24 hr capsule Commonly known as: ADDERALL XR TAKE TWO CAPSULE BY MOUTH EVERY MORNING   clonazePAM 0.5 MG tablet Commonly known as: KLONOPIN Take 0.5-1 tablets (0.25-0.5 mg total) by mouth 2 (two) times daily as needed for anxiety.   furosemide 40 MG tablet Commonly known as: LASIX Take 1 tablet (40 mg total) by mouth daily as needed for fluid or edema.   neomycin-bacitracin-polymyxin ointment Commonly known as: NEOSPORIN Apply 1 application topically daily as needed for wound care.   oxyCODONE 15 MG immediate release tablet Commonly known as: ROXICODONE Take 1 tablet (15 mg total) by mouth 4 (four) times daily. What changed: when to take this   SUMAtriptan 50 MG tablet Commonly known as: IMITREX TAKE 1 TABLET BY MOUTH EVERY 2 HOURS AS NEEDED FOR MIGRAINE OR HEADACHE, MAX OF 2 DOSES IN 24 HOURS   venlafaxine XR 75 MG 24 hr capsule Commonly known as: EFFEXOR-XR Take 2 capsules (150 mg total) by mouth daily.           Objective:   Physical Exam There were no vitals taken for this visit. This is a virtual  video visit, the patient is alert, oriented x3, her memory seems to be normal.  She is laying down in bed, her granddaughter is around.  No distress.    Assessment    73 y/o  Female; PMH includes HTN, hyperlipidemia, edema, kidney stone with sepsis  October 2019  status post nephrectomy, surgery for humeral fracture 04/2019, surgery for intramedullary nail placement right femur 05-2019 presents with:   Involuntary movements: As described above, is difficult to sort out via a video visit what is the etiology. This is going on for a while but worse in the last couple of days. With generalized involuntary movements DDX includes: Seizure, chills, [place], trembling due to general debility, etc. No obvious infection or injury per review of systems Plan: Needs to be seen in person, will arrange to see PCP next week.  ER if symptoms severe, fever, chills, falls.   I discussed the assessment and treatment plan with the patient. The patient was provided an opportunity to ask questions and all were answered. The patient agreed with the plan and demonstrated an understanding of the instructions.   The patient was advised to call back or seek an in-person evaluation if the symptoms worsen or if the condition fails to improve as anticipated.

## 2019-08-10 ENCOUNTER — Other Ambulatory Visit: Payer: Self-pay | Admitting: Family Medicine

## 2019-08-10 ENCOUNTER — Telehealth: Payer: Self-pay | Admitting: Family Medicine

## 2019-08-10 DIAGNOSIS — F988 Other specified behavioral and emotional disorders with onset usually occurring in childhood and adolescence: Secondary | ICD-10-CM

## 2019-08-10 NOTE — Telephone Encounter (Signed)
-----   Message from Colon Branch, MD sent at 08/09/2019  4:34 PM EDT ----- Regarding: Needs to be seen next week by PCP

## 2019-08-10 NOTE — Telephone Encounter (Signed)
LEFT PT MSG TO CALL BACK AND MAKE APPT

## 2019-08-10 NOTE — Telephone Encounter (Signed)
Requesting: Adderall XR Contract: 02/07/2018 UDS: 10/24/2018, low risk, next screen 04/25/2019 Last OV: 08/09/2019 w/ Paz Next OV: 09/25/2019 w/ [place] Last Refill: 07/12/2019, #60--0 RF Database:   Please advise

## 2019-08-24 ENCOUNTER — Encounter (HOSPITAL_COMMUNITY): Payer: Self-pay | Admitting: Orthopedic Surgery

## 2019-09-11 ENCOUNTER — Other Ambulatory Visit: Payer: Self-pay | Admitting: Family Medicine

## 2019-09-11 DIAGNOSIS — F988 Other specified behavioral and emotional disorders with onset usually occurring in childhood and adolescence: Secondary | ICD-10-CM

## 2019-09-12 NOTE — Telephone Encounter (Signed)
Requesting: Adderall XR Contract: 02/07/2018 UDS: 10/24/2018, low risk, next screen 04/25/2019 Last OV: 08/09/2019 w/ Paz Next OV: 09/25/2019 Last Refill: 08/10/2019, #60--0 RF Database:   Please advise

## 2019-09-24 ENCOUNTER — Other Ambulatory Visit: Payer: Self-pay | Admitting: Family Medicine

## 2019-09-24 DIAGNOSIS — F419 Anxiety disorder, unspecified: Secondary | ICD-10-CM

## 2019-09-25 ENCOUNTER — Ambulatory Visit: Payer: Medicare Other | Admitting: Family Medicine

## 2019-09-25 NOTE — Telephone Encounter (Signed)
Requesting:Clonazepam Contract:none UDS:10/24/2018 Last Visit:06/28/2019 Next Visit:10/01/2019 Last Refill:06/28/19 1 refill  Please Advise

## 2019-10-01 ENCOUNTER — Ambulatory Visit: Payer: Medicare Other | Admitting: Family Medicine

## 2019-10-12 ENCOUNTER — Other Ambulatory Visit: Payer: Self-pay | Admitting: Family Medicine

## 2019-10-12 DIAGNOSIS — R519 Headache, unspecified: Secondary | ICD-10-CM

## 2019-10-12 DIAGNOSIS — F988 Other specified behavioral and emotional disorders with onset usually occurring in childhood and adolescence: Secondary | ICD-10-CM

## 2019-10-15 NOTE — Telephone Encounter (Signed)
Requesting: Adderall XR 20mg  Contract: 02/07/2018 UDS: 10/24/2018 Last Visit: 08/09/2019 w/Paz Next Visit:Not scheduled  Last Refill:09/12/2019, #60 w/0 RF  Please Advise

## 2019-10-16 ENCOUNTER — Ambulatory Visit (INDEPENDENT_AMBULATORY_CARE_PROVIDER_SITE_OTHER): Payer: Medicare Other | Admitting: Family Medicine

## 2019-10-16 ENCOUNTER — Encounter: Payer: Self-pay | Admitting: Family Medicine

## 2019-10-16 DIAGNOSIS — R509 Fever, unspecified: Secondary | ICD-10-CM

## 2019-10-16 DIAGNOSIS — R300 Dysuria: Secondary | ICD-10-CM

## 2019-10-16 DIAGNOSIS — Z20828 Contact with and (suspected) exposure to other viral communicable diseases: Secondary | ICD-10-CM | POA: Diagnosis not present

## 2019-10-16 DIAGNOSIS — Z20822 Contact with and (suspected) exposure to covid-19: Secondary | ICD-10-CM

## 2019-10-16 MED ORDER — SULFAMETHOXAZOLE-TRIMETHOPRIM 800-160 MG PO TABS: 1.0000 | ORAL_TABLET | Freq: Two times a day (BID) | ORAL | 0 refills | Status: DC

## 2019-10-16 NOTE — Progress Notes (Signed)
Virtual Visit via Video Note  I connected with [name] on 10/16/19 at  4:00 PM EST by a video enabled telemedicine application and verified that I am speaking with the correct person using two identifiers.  Location: Patient: home with her husband and grand daughter  Provider: office    I discussed the limitations of evaluation and management by telemedicine and the availability of in person appointments. The patient expressed understanding and agreed to proceed.  History of Present Illness:    Observations/Objective: 101.2   No other vitals obtained Pt in NAD  Assessment and Plan: 1. Dysuria tx empirically Will arrange to check urine if no better in a few days  - sulfamethoxazole-trimethoprim (BACTRIM DS) 800-160 MG tablet; Take 1 tablet by mouth 2 (two) times daily.  Dispense: 14 tablet; Refill: 0  2. Fever, unspecified fever cause covid test Maybe due to urine Check ua if no relief of symptoms with abx If symptoms worsen -- go to ER   Follow Up Instructions:    I discussed the assessment and treatment plan with the patient. The patient was provided an opportunity to ask questions and all were answered. The patient agreed with the plan and demonstrated an understanding of the instructions.   The patient was advised to call back or seek an in-person evaluation if the symptoms worsen or if the condition fails to improve as anticipated.  I provided 15 minutes of non-face-to-face time during this encounter.   Ann Held, DO

## 2019-10-17 ENCOUNTER — Encounter (INDEPENDENT_AMBULATORY_CARE_PROVIDER_SITE_OTHER): Payer: Self-pay

## 2019-10-17 ENCOUNTER — Other Ambulatory Visit: Payer: Self-pay

## 2019-10-17 DIAGNOSIS — Z20822 Contact with and (suspected) exposure to covid-19: Secondary | ICD-10-CM

## 2019-10-19 LAB — NOVEL CORONAVIRUS, NAA: SARS-CoV-2, NAA: DETECTED — AB

## 2019-10-30 ENCOUNTER — Other Ambulatory Visit: Payer: Self-pay | Admitting: Family Medicine

## 2019-10-30 DIAGNOSIS — F411 Generalized anxiety disorder: Secondary | ICD-10-CM

## 2019-10-30 DIAGNOSIS — F419 Anxiety disorder, unspecified: Secondary | ICD-10-CM

## 2019-10-30 NOTE — Telephone Encounter (Signed)
Requesting: Klonopin Contract: 02/07/2018 UDS: 10/24/2018 Last OV: 10/16/2019 Next OV: N/A Last Refill: 09/25/2019, #60--0 RF Database:   Please advise

## 2019-11-13 DIAGNOSIS — G894 Chronic pain syndrome: Secondary | ICD-10-CM | POA: Diagnosis not present

## 2019-11-13 DIAGNOSIS — M4120 Other idiopathic scoliosis, site unspecified: Secondary | ICD-10-CM | POA: Diagnosis not present

## 2019-11-13 DIAGNOSIS — M5106 Intervertebral disc disorders with myelopathy, lumbar region: Secondary | ICD-10-CM | POA: Diagnosis not present

## 2019-11-13 DIAGNOSIS — M545 Low back pain: Secondary | ICD-10-CM | POA: Diagnosis not present

## 2019-11-15 ENCOUNTER — Other Ambulatory Visit: Payer: Self-pay | Admitting: Family Medicine

## 2019-11-15 DIAGNOSIS — N3000 Acute cystitis without hematuria: Secondary | ICD-10-CM | POA: Diagnosis not present

## 2019-11-15 DIAGNOSIS — F988 Other specified behavioral and emotional disorders with onset usually occurring in childhood and adolescence: Secondary | ICD-10-CM

## 2019-11-15 NOTE — Telephone Encounter (Signed)
Requesting: Adderall XR Contract: 02/07/2018 UDS: 10/24/2018 Last OV: 10/16/2019 Next OV: N/A Last Refill: 10/15/2019, #60--0 RF Database:   Please advise

## 2019-11-19 DIAGNOSIS — R2689 Other abnormalities of gait and mobility: Secondary | ICD-10-CM | POA: Diagnosis not present

## 2019-12-05 DIAGNOSIS — S72144A Nondisplaced intertrochanteric fracture of right femur, initial encounter for closed fracture: Secondary | ICD-10-CM | POA: Diagnosis not present

## 2019-12-13 DIAGNOSIS — G894 Chronic pain syndrome: Secondary | ICD-10-CM | POA: Diagnosis not present

## 2019-12-13 DIAGNOSIS — M545 Low back pain: Secondary | ICD-10-CM | POA: Diagnosis not present

## 2019-12-13 DIAGNOSIS — M5106 Intervertebral disc disorders with myelopathy, lumbar region: Secondary | ICD-10-CM | POA: Diagnosis not present

## 2019-12-13 DIAGNOSIS — M4120 Other idiopathic scoliosis, site unspecified: Secondary | ICD-10-CM | POA: Diagnosis not present

## 2019-12-20 DIAGNOSIS — R2689 Other abnormalities of gait and mobility: Secondary | ICD-10-CM | POA: Diagnosis not present

## 2019-12-31 ENCOUNTER — Other Ambulatory Visit: Payer: Self-pay | Admitting: Family Medicine

## 2019-12-31 DIAGNOSIS — F988 Other specified behavioral and emotional disorders with onset usually occurring in childhood and adolescence: Secondary | ICD-10-CM

## 2020-01-01 NOTE — Telephone Encounter (Signed)
Requesting: adderall Contract:n/a UDS: low risk  Last OV:10/16/19 Next OV: n/a Last Refill:11/15/19  #60-0rf Database:   Please advise

## 2020-01-05 DIAGNOSIS — S72144A Nondisplaced intertrochanteric fracture of right femur, initial encounter for closed fracture: Secondary | ICD-10-CM | POA: Diagnosis not present

## 2020-01-06 IMAGING — CT CT OF THE LEFT HIP WITHOUT CONTRAST
1 series · 16 of 32 positions shown, 20 images · non-contrast
Comparison: None.

CLINICAL DATA: Left hip pain status post fall 3 weeks ago

EXAM:
CT OF THE LEFT HIP WITHOUT CONTRAST
TECHNIQUE: Multidetector CT imaging of the left hip was performed according to
the standard protocol. Multiplanar CT image reconstructions were
also generated.

[Series 3: soft tissue pelvis/hip · axial · 0.41mm/px · z∈[-290,-104]mm · 16 of 70 slices shown, 20 images]
[im 5/70  soft-tissue]
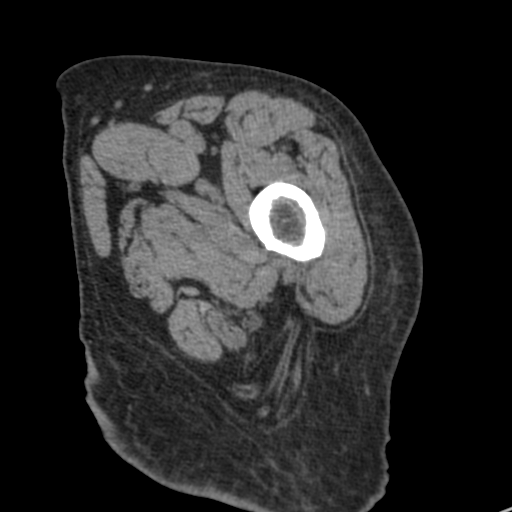
[im 5/70  bone]
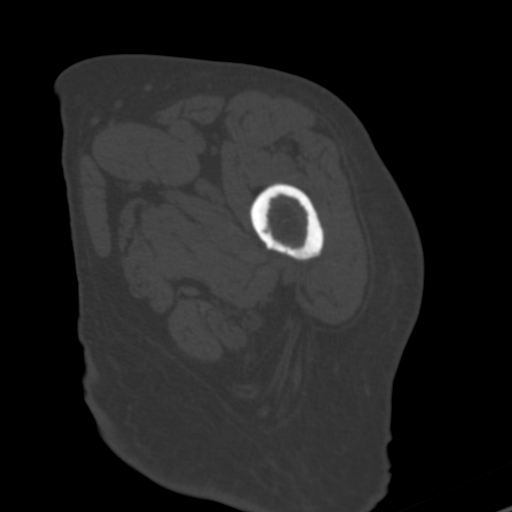
[im 9/70  soft-tissue]
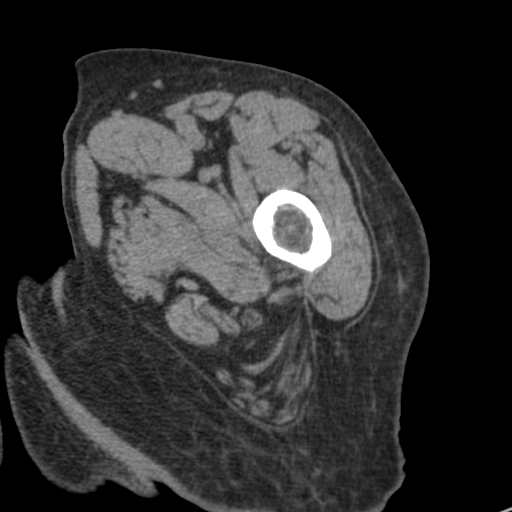
[im 14/70  soft-tissue]
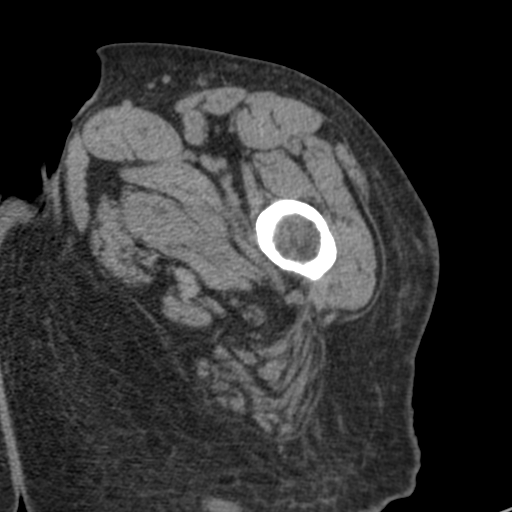
[im 18/70  soft-tissue]
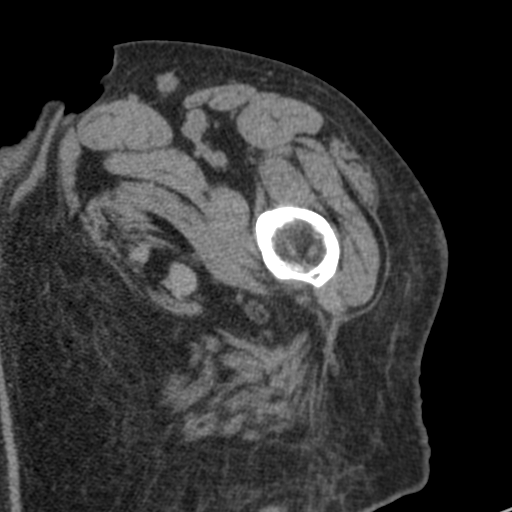
[im 23/70  soft-tissue]
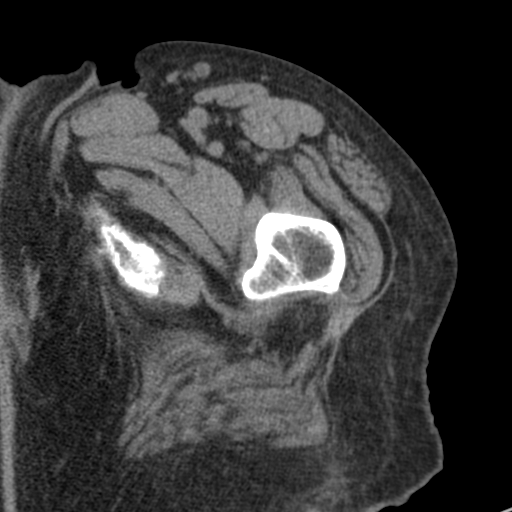
[im 27/70  soft-tissue]
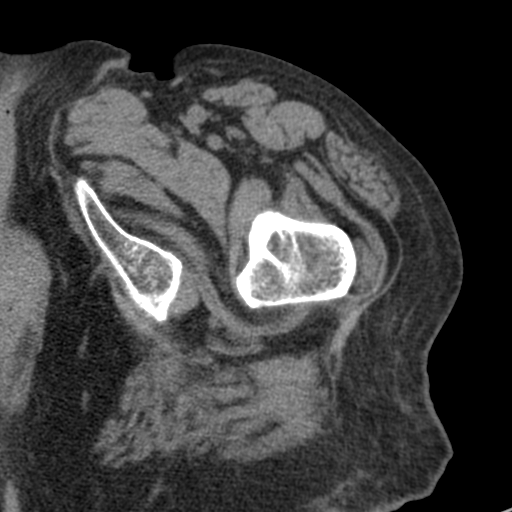
[im 32/70  soft-tissue]
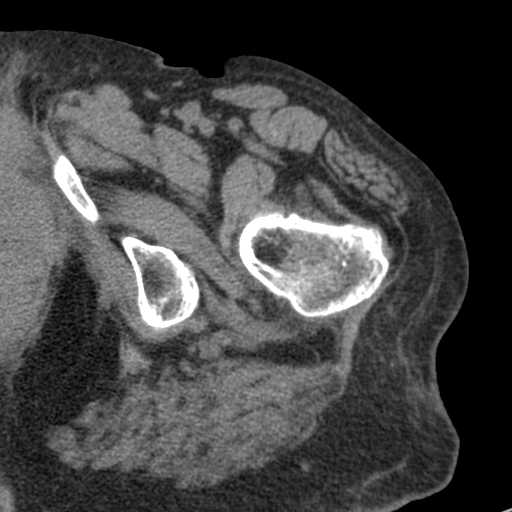
[im 38/70  soft-tissue]
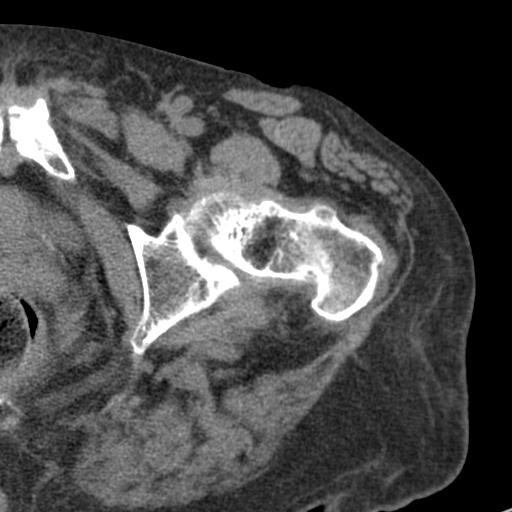
[im 43/70  soft-tissue]
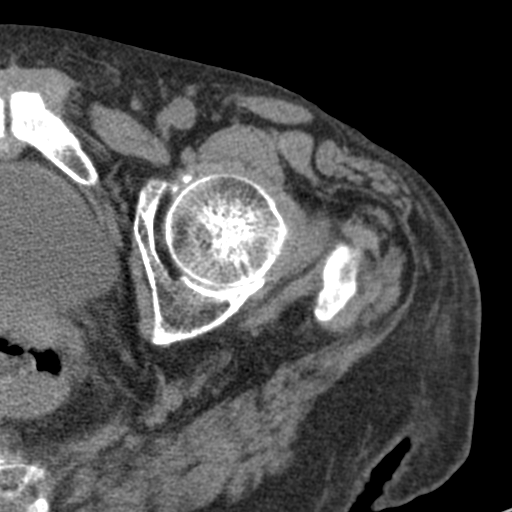
[im 43/70  bone]
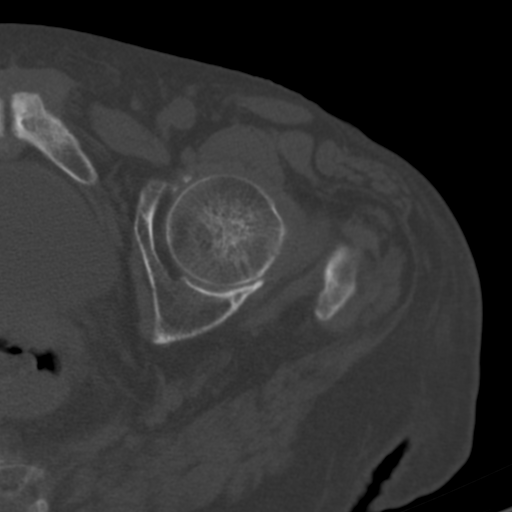
[im 47/70  soft-tissue]
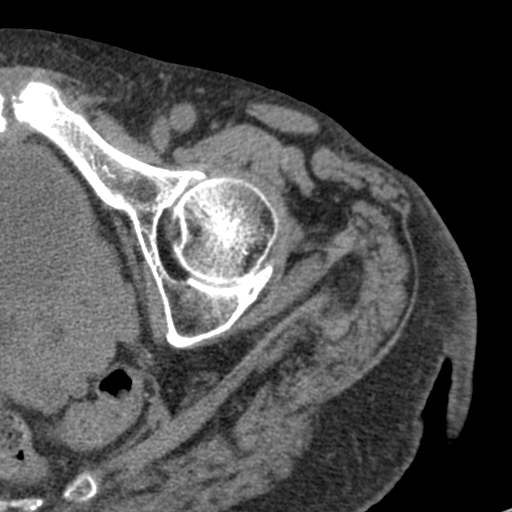
[im 52/70  soft-tissue]
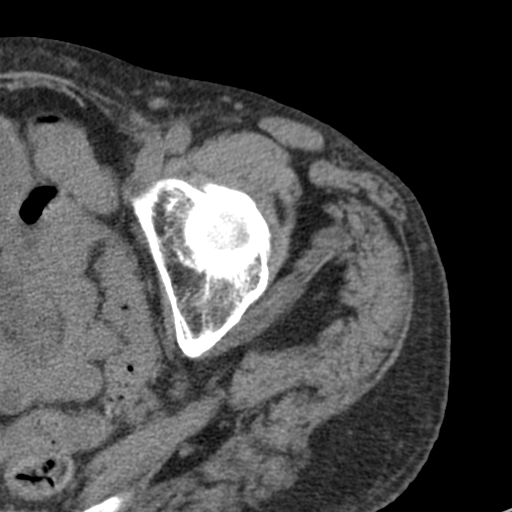
[im 56/70  soft-tissue]
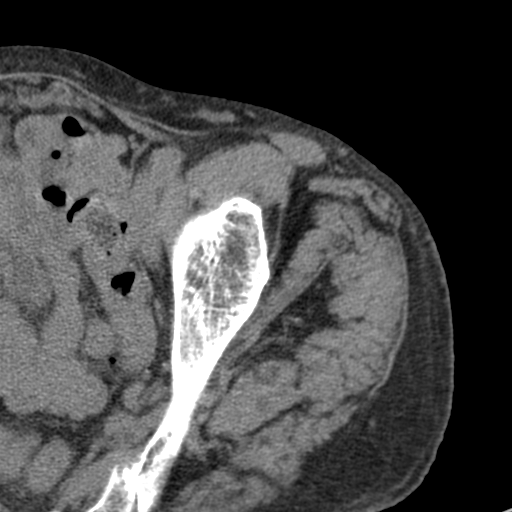
[im 61/70  soft-tissue]
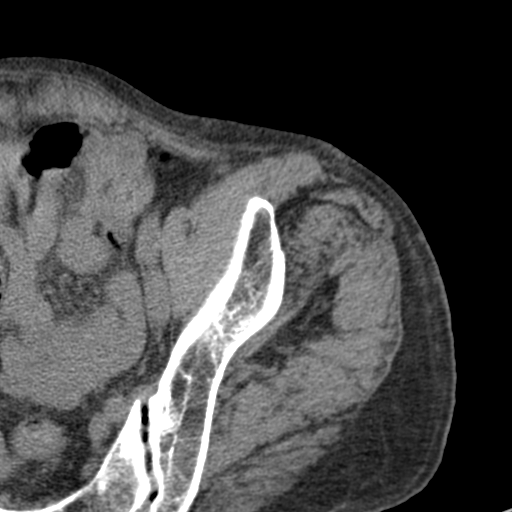
[im 61/70  lung]
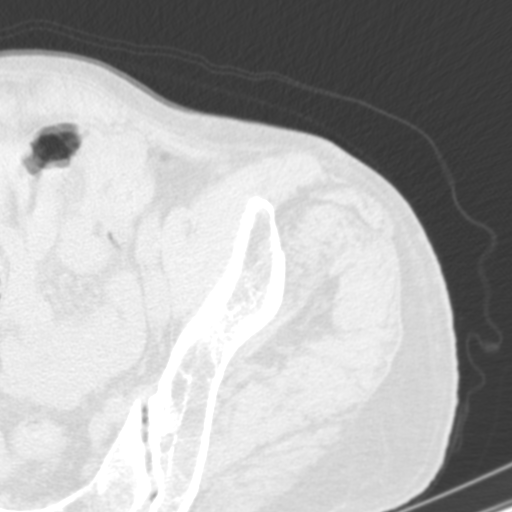
[im 63/70  lung]
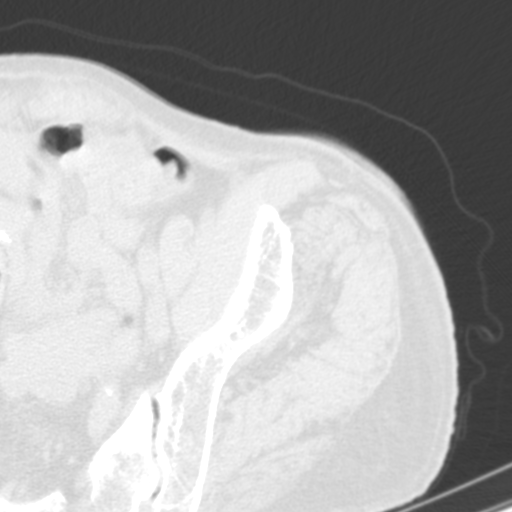
[im 65/70  soft-tissue]
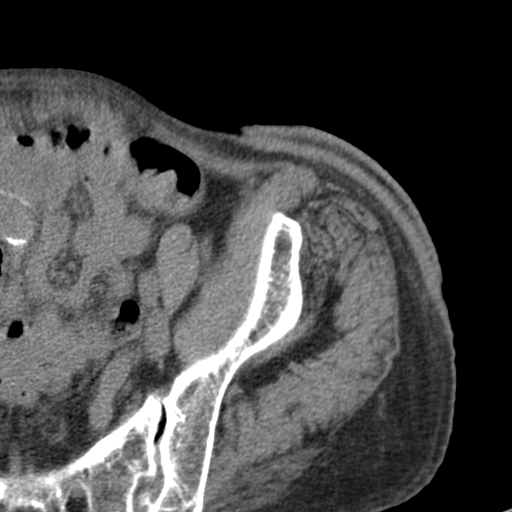
[im 65/70  lung]
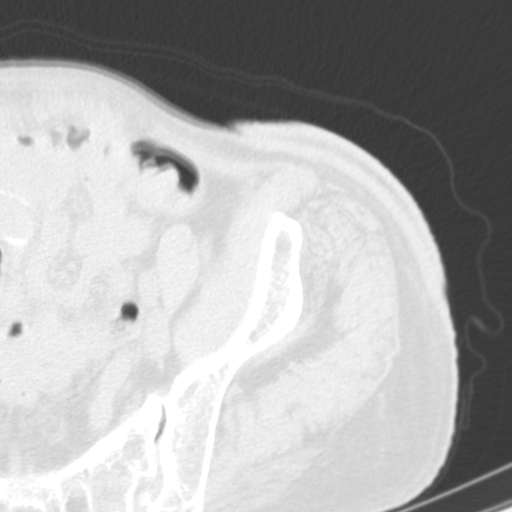
[im 67/70  lung]
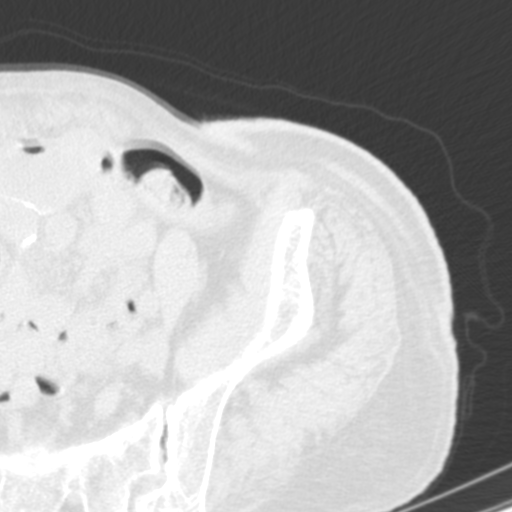

[16 of 32 positions shown; findings below may reference images not displayed]

FINDINGS: Bones/Joint/Cartilage

Generalized osteopenia. No fracture or dislocation. Normal
alignment. No joint effusion. Mild osteoarthritis of the left hip.

Moderate osteoarthritis of the left SI joint.

Ligaments

Ligaments are suboptimally evaluated by CT.

Muscles and Tendons
Muscles are normal.  No muscle atrophy.

Soft tissue
No fluid collection or hematoma.  No soft tissue mass.
IMPRESSION: 1. No acute osseous injury of the left hip.

## 2020-01-07 ENCOUNTER — Other Ambulatory Visit: Payer: Self-pay | Admitting: Family Medicine

## 2020-01-07 DIAGNOSIS — R519 Headache, unspecified: Secondary | ICD-10-CM

## 2020-01-10 DIAGNOSIS — M4120 Other idiopathic scoliosis, site unspecified: Secondary | ICD-10-CM | POA: Diagnosis not present

## 2020-01-10 DIAGNOSIS — M545 Low back pain: Secondary | ICD-10-CM | POA: Diagnosis not present

## 2020-01-10 DIAGNOSIS — G894 Chronic pain syndrome: Secondary | ICD-10-CM | POA: Diagnosis not present

## 2020-01-10 DIAGNOSIS — M5106 Intervertebral disc disorders with myelopathy, lumbar region: Secondary | ICD-10-CM | POA: Diagnosis not present

## 2020-01-14 DIAGNOSIS — M25522 Pain in left elbow: Secondary | ICD-10-CM | POA: Diagnosis not present

## 2020-01-14 DIAGNOSIS — M13849 Other specified arthritis, unspecified hand: Secondary | ICD-10-CM | POA: Diagnosis not present

## 2020-01-14 DIAGNOSIS — S42412D Displaced simple supracondylar fracture without intercondylar fracture of left humerus, subsequent encounter for fracture with routine healing: Secondary | ICD-10-CM | POA: Diagnosis not present

## 2020-01-16 DIAGNOSIS — M170 Bilateral primary osteoarthritis of knee: Secondary | ICD-10-CM | POA: Diagnosis not present

## 2020-01-16 DIAGNOSIS — M1712 Unilateral primary osteoarthritis, left knee: Secondary | ICD-10-CM | POA: Diagnosis not present

## 2020-01-16 DIAGNOSIS — M25562 Pain in left knee: Secondary | ICD-10-CM | POA: Diagnosis not present

## 2020-01-16 DIAGNOSIS — M1711 Unilateral primary osteoarthritis, right knee: Secondary | ICD-10-CM | POA: Diagnosis not present

## 2020-01-17 DIAGNOSIS — R2689 Other abnormalities of gait and mobility: Secondary | ICD-10-CM | POA: Diagnosis not present

## 2020-02-02 DIAGNOSIS — S72144A Nondisplaced intertrochanteric fracture of right femur, initial encounter for closed fracture: Secondary | ICD-10-CM | POA: Diagnosis not present

## 2020-02-07 DIAGNOSIS — M5106 Intervertebral disc disorders with myelopathy, lumbar region: Secondary | ICD-10-CM | POA: Diagnosis not present

## 2020-02-07 DIAGNOSIS — M4120 Other idiopathic scoliosis, site unspecified: Secondary | ICD-10-CM | POA: Diagnosis not present

## 2020-02-07 DIAGNOSIS — M545 Low back pain: Secondary | ICD-10-CM | POA: Diagnosis not present

## 2020-02-07 DIAGNOSIS — G894 Chronic pain syndrome: Secondary | ICD-10-CM | POA: Diagnosis not present

## 2020-02-11 ENCOUNTER — Other Ambulatory Visit: Payer: Self-pay | Admitting: Family Medicine

## 2020-02-11 DIAGNOSIS — F419 Anxiety disorder, unspecified: Secondary | ICD-10-CM

## 2020-02-11 DIAGNOSIS — N3000 Acute cystitis without hematuria: Secondary | ICD-10-CM | POA: Diagnosis not present

## 2020-02-11 DIAGNOSIS — F988 Other specified behavioral and emotional disorders with onset usually occurring in childhood and adolescence: Secondary | ICD-10-CM

## 2020-02-12 NOTE — Telephone Encounter (Signed)
Requesting: adderall & Klonopin Contract:n/a UDS: n/a Last OV:10/16/2019 Next OV:n/a Last Refill: adderall 01/01/20 #60-0rf Klonopin 10/30/19 #60-02rf Database:   Please advise

## 2020-02-17 DIAGNOSIS — R2689 Other abnormalities of gait and mobility: Secondary | ICD-10-CM | POA: Diagnosis not present

## 2020-03-04 DIAGNOSIS — S72144A Nondisplaced intertrochanteric fracture of right femur, initial encounter for closed fracture: Secondary | ICD-10-CM | POA: Diagnosis not present

## 2020-03-13 DIAGNOSIS — G894 Chronic pain syndrome: Secondary | ICD-10-CM | POA: Diagnosis not present

## 2020-03-13 DIAGNOSIS — N3000 Acute cystitis without hematuria: Secondary | ICD-10-CM | POA: Diagnosis not present

## 2020-03-13 DIAGNOSIS — M5106 Intervertebral disc disorders with myelopathy, lumbar region: Secondary | ICD-10-CM | POA: Diagnosis not present

## 2020-03-13 DIAGNOSIS — M4120 Other idiopathic scoliosis, site unspecified: Secondary | ICD-10-CM | POA: Diagnosis not present

## 2020-03-13 DIAGNOSIS — M545 Low back pain: Secondary | ICD-10-CM | POA: Diagnosis not present

## 2020-03-17 ENCOUNTER — Other Ambulatory Visit: Payer: Self-pay | Admitting: Family Medicine

## 2020-03-17 DIAGNOSIS — F988 Other specified behavioral and emotional disorders with onset usually occurring in childhood and adolescence: Secondary | ICD-10-CM

## 2020-03-17 DIAGNOSIS — F419 Anxiety disorder, unspecified: Secondary | ICD-10-CM

## 2020-03-17 NOTE — Telephone Encounter (Signed)
Requesting: adderall & klonopin  Contract: n/a UDS:n/a Last Visit:10/16/19 Next Visit:n/a Last Refill:02/12/20( both)  Please Advise

## 2020-03-17 NOTE — Telephone Encounter (Signed)
She can not take adderall klonopin and pain meds together ---- contraindicated

## 2020-03-18 DIAGNOSIS — R2689 Other abnormalities of gait and mobility: Secondary | ICD-10-CM | POA: Diagnosis not present

## 2020-03-20 ENCOUNTER — Other Ambulatory Visit: Payer: Self-pay | Admitting: Family Medicine

## 2020-03-20 DIAGNOSIS — R519 Headache, unspecified: Secondary | ICD-10-CM

## 2020-03-28 DIAGNOSIS — X58XXXA Exposure to other specified factors, initial encounter: Secondary | ICD-10-CM | POA: Diagnosis not present

## 2020-03-28 DIAGNOSIS — R404 Transient alteration of awareness: Secondary | ICD-10-CM | POA: Diagnosis not present

## 2020-03-28 DIAGNOSIS — Z743 Need for continuous supervision: Secondary | ICD-10-CM | POA: Diagnosis not present

## 2020-03-28 DIAGNOSIS — E1165 Type 2 diabetes mellitus with hyperglycemia: Secondary | ICD-10-CM | POA: Diagnosis not present

## 2020-03-28 DIAGNOSIS — E876 Hypokalemia: Secondary | ICD-10-CM | POA: Diagnosis not present

## 2020-03-28 DIAGNOSIS — R4020 Unspecified coma: Secondary | ICD-10-CM | POA: Diagnosis not present

## 2020-03-28 DIAGNOSIS — R400 Somnolence: Secondary | ICD-10-CM | POA: Diagnosis not present

## 2020-03-28 DIAGNOSIS — T40601A Poisoning by unspecified narcotics, accidental (unintentional), initial encounter: Secondary | ICD-10-CM | POA: Diagnosis not present

## 2020-03-28 DIAGNOSIS — T50904A Poisoning by unspecified drugs, medicaments and biological substances, undetermined, initial encounter: Secondary | ICD-10-CM | POA: Diagnosis not present

## 2020-03-28 DIAGNOSIS — Y998 Other external cause status: Secondary | ICD-10-CM | POA: Diagnosis not present

## 2020-03-28 DIAGNOSIS — R000 Tachycardia, unspecified: Secondary | ICD-10-CM | POA: Diagnosis not present

## 2020-04-02 ENCOUNTER — Telehealth: Payer: Self-pay | Admitting: Family Medicine

## 2020-04-02 ENCOUNTER — Other Ambulatory Visit: Payer: Self-pay

## 2020-04-02 ENCOUNTER — Other Ambulatory Visit: Payer: Self-pay | Admitting: Family Medicine

## 2020-04-02 DIAGNOSIS — F988 Other specified behavioral and emotional disorders with onset usually occurring in childhood and adolescence: Secondary | ICD-10-CM

## 2020-04-02 MED ORDER — AMPHETAMINE-DEXTROAMPHET ER 20 MG PO CP24: ORAL_CAPSULE | ORAL | 0 refills | Status: DC

## 2020-04-02 NOTE — Telephone Encounter (Signed)
Request sent to [facility] 

## 2020-04-02 NOTE — Progress Notes (Signed)
I refilled adderall

## 2020-04-02 NOTE — Telephone Encounter (Signed)
Medication:amphetamine-dextroamphetamine (ADDERALL XR) 20 MG 24 hr capsule     Has the patient contacted their pharmacy? Yes.   (If no, request that the patient contact the pharmacy for the refill.) (If yes, when and what did the pharmacy advise?) Refill faxed  Preferred Pharmacy (with phone number or street name): [facility] - [place], [place] - [address] Phone:  [phone]  Fax:  [phone]      Agent: Please be advised that RX refills may take up to 3 business days. We ask that you follow-up with your pharmacy.

## 2020-04-03 DIAGNOSIS — S72144A Nondisplaced intertrochanteric fracture of right femur, initial encounter for closed fracture: Secondary | ICD-10-CM | POA: Diagnosis not present

## 2020-04-10 DIAGNOSIS — G894 Chronic pain syndrome: Secondary | ICD-10-CM | POA: Diagnosis not present

## 2020-04-11 NOTE — Telephone Encounter (Signed)
Could you please refuse medication.

## 2020-04-17 ENCOUNTER — Other Ambulatory Visit: Payer: Self-pay | Admitting: Family Medicine

## 2020-04-17 DIAGNOSIS — F419 Anxiety disorder, unspecified: Secondary | ICD-10-CM

## 2020-04-17 NOTE — Telephone Encounter (Signed)
Medication: clonazePAM (KLONOPIN) 0.5 MG tablet [[id number]]    Has the patient contacted their pharmacy? No. (If no, request that the patient contact the pharmacy for the refill.) (If yes, when and what did the pharmacy advise?)  Preferred Pharmacy (with phone number or street name): [facility] - [place], [place] - [address]  [address], [place] [address]  Phone:  [phone] Fax:  [phone]  DEA #:  --  Agent: Please be advised that RX refills may take up to 3 business days. We ask that you follow-up with your pharmacy.

## 2020-04-18 DIAGNOSIS — R2689 Other abnormalities of gait and mobility: Secondary | ICD-10-CM | POA: Diagnosis not present

## 2020-04-18 MED ORDER — CLONAZEPAM 0.5 MG PO TABS: 0.2500 mg | ORAL_TABLET | Freq: Two times a day (BID) | ORAL | 1 refills | Status: DC | PRN

## 2020-04-18 NOTE — Telephone Encounter (Signed)
Requesting: klonopin  Contract: 02/07/2018 UDS: 10/24/2018 Last OV: 10/16/2019 Next OV: 04/21/2020 Last Refill: 02/12/20, #60--1 RF Database:   Please advise

## 2020-04-21 ENCOUNTER — Ambulatory Visit: Payer: Medicare Other | Admitting: Family Medicine

## 2020-04-22 ENCOUNTER — Telehealth (INDEPENDENT_AMBULATORY_CARE_PROVIDER_SITE_OTHER): Payer: Medicare PPO | Admitting: Family Medicine

## 2020-04-22 ENCOUNTER — Encounter: Payer: Self-pay | Admitting: Family Medicine

## 2020-04-22 ENCOUNTER — Other Ambulatory Visit: Payer: Self-pay

## 2020-04-22 DIAGNOSIS — F418 Other specified anxiety disorders: Secondary | ICD-10-CM

## 2020-04-22 DIAGNOSIS — E785 Hyperlipidemia, unspecified: Secondary | ICD-10-CM | POA: Diagnosis not present

## 2020-04-22 DIAGNOSIS — Z862 Personal history of diseases of the blood and blood-forming organs and certain disorders involving the immune mechanism: Secondary | ICD-10-CM | POA: Diagnosis not present

## 2020-04-22 DIAGNOSIS — E1169 Type 2 diabetes mellitus with other specified complication: Secondary | ICD-10-CM

## 2020-04-22 NOTE — Progress Notes (Signed)
Virtual Visit via Video Note  I connected with [name] on 04/22/20 at  9:20 AM EDT by a video enabled telemedicine application and verified that I am speaking with the correct person using two identifiers.  Location: Patient: home with her husband Provider: office    I discussed the limitations of evaluation and management by telemedicine and the availability of in person appointments. The patient expressed understanding and agreed to proceed.  History of Present Illness: Pt is home with her husband.   She was in ER after taking muscle relaxer , pain pill and abx cipro together and she became very tired -- she went into BR and did not come out-----her grand daughter went in and she could not wake her up.  They gave her narcan in ambulance.  She did not take 3 oxycodone together --- she only ever takes 1.     Observations/Objective: There were no vitals filed for this visit. Pt is in nad    Assessment and Plan:  1. History of anemia Check labs  - Comprehensive metabolic panel; Future - CBC with Differential/Platelet; Future  2. Depression with anxiety Stable  con't meds  - Comprehensive metabolic panel; Future - CBC with Differential/Platelet; Future  3. Hyperlipidemia associated with type 2 diabetes mellitus (HCC) Encouraged heart healthy diet, increase exercise, avoid trans fats, consider a krill oil cap daily - Lipid panel; Future  Follow Up Instructions:    I discussed the assessment and treatment plan with the patient. The patient was provided an opportunity to ask questions and all were answered. The patient agreed with the plan and demonstrated an understanding of the instructions.   The patient was advised to call back or seek an in-person evaluation if the symptoms worsen or if the condition fails to improve as anticipated.  I provided 30 minutes of non-face-to-face time during this encounter.   Donato Schultz, DO

## 2020-05-04 DIAGNOSIS — S72144A Nondisplaced intertrochanteric fracture of right femur, initial encounter for closed fracture: Secondary | ICD-10-CM | POA: Diagnosis not present

## 2020-05-06 ENCOUNTER — Other Ambulatory Visit: Payer: Self-pay | Admitting: Family Medicine

## 2020-05-06 DIAGNOSIS — F988 Other specified behavioral and emotional disorders with onset usually occurring in childhood and adolescence: Secondary | ICD-10-CM

## 2020-05-06 NOTE — Telephone Encounter (Signed)
Adderall refill.   Last OV: 04/22/2020 Last Fill: 04/02/2020 #60 and 0RF Pt sig: 2 capsules qd UDS: 10/24/2018 Low risk

## 2020-05-09 DIAGNOSIS — M5106 Intervertebral disc disorders with myelopathy, lumbar region: Secondary | ICD-10-CM | POA: Diagnosis not present

## 2020-05-09 DIAGNOSIS — G894 Chronic pain syndrome: Secondary | ICD-10-CM | POA: Diagnosis not present

## 2020-05-09 DIAGNOSIS — M40209 Unspecified kyphosis, site unspecified: Secondary | ICD-10-CM | POA: Diagnosis not present

## 2020-05-09 DIAGNOSIS — M545 Low back pain: Secondary | ICD-10-CM | POA: Diagnosis not present

## 2020-05-18 DIAGNOSIS — R2689 Other abnormalities of gait and mobility: Secondary | ICD-10-CM | POA: Diagnosis not present

## 2020-06-03 DIAGNOSIS — S72144A Nondisplaced intertrochanteric fracture of right femur, initial encounter for closed fracture: Secondary | ICD-10-CM | POA: Diagnosis not present

## 2020-06-05 ENCOUNTER — Other Ambulatory Visit: Payer: Self-pay | Admitting: Family Medicine

## 2020-06-05 DIAGNOSIS — F419 Anxiety disorder, unspecified: Secondary | ICD-10-CM

## 2020-06-05 DIAGNOSIS — G894 Chronic pain syndrome: Secondary | ICD-10-CM | POA: Diagnosis not present

## 2020-06-05 DIAGNOSIS — F988 Other specified behavioral and emotional disorders with onset usually occurring in childhood and adolescence: Secondary | ICD-10-CM

## 2020-06-05 NOTE — Telephone Encounter (Signed)
Adderall  Last OV: 04/22/2020 Last Fill on Adderall 05/06/2020 #60 and 0RF Pt sig: 2 capsules daily UDS: 10/24/2018 Low risk

## 2020-06-11 ENCOUNTER — Other Ambulatory Visit: Payer: Self-pay | Admitting: Family Medicine

## 2020-06-11 DIAGNOSIS — R519 Headache, unspecified: Secondary | ICD-10-CM

## 2020-06-18 DIAGNOSIS — R2689 Other abnormalities of gait and mobility: Secondary | ICD-10-CM | POA: Diagnosis not present

## 2020-06-23 DIAGNOSIS — N3000 Acute cystitis without hematuria: Secondary | ICD-10-CM | POA: Diagnosis not present

## 2020-06-28 ENCOUNTER — Other Ambulatory Visit: Payer: Self-pay | Admitting: Family Medicine

## 2020-06-28 DIAGNOSIS — F419 Anxiety disorder, unspecified: Secondary | ICD-10-CM

## 2020-06-30 NOTE — Telephone Encounter (Signed)
Clonazepam refill.   Last OV: 04/22/2020  Last Fill: 04/18/2020 #60 and 1RF Pt sig: 1/2 to 1 tab bid prn UDS: 10/24/2018 Low risk

## 2020-07-04 DIAGNOSIS — S72144A Nondisplaced intertrochanteric fracture of right femur, initial encounter for closed fracture: Secondary | ICD-10-CM | POA: Diagnosis not present

## 2020-07-11 ENCOUNTER — Other Ambulatory Visit: Payer: Self-pay | Admitting: Family Medicine

## 2020-07-11 DIAGNOSIS — G894 Chronic pain syndrome: Secondary | ICD-10-CM | POA: Diagnosis not present

## 2020-07-11 DIAGNOSIS — F988 Other specified behavioral and emotional disorders with onset usually occurring in childhood and adolescence: Secondary | ICD-10-CM

## 2020-07-11 NOTE — Telephone Encounter (Signed)
Adderall refill. [facility] Pt.   Last OV: 04/22/2020 Last Fill: 06/05/2020 #60 and 0RF Pt sig: 2 capsules qd UDS: 10/24/2018 Low risk

## 2020-07-19 DIAGNOSIS — R2689 Other abnormalities of gait and mobility: Secondary | ICD-10-CM | POA: Diagnosis not present

## 2020-08-04 DIAGNOSIS — S72144A Nondisplaced intertrochanteric fracture of right femur, initial encounter for closed fracture: Secondary | ICD-10-CM | POA: Diagnosis not present

## 2020-08-06 ENCOUNTER — Encounter: Payer: Self-pay | Admitting: Family Medicine

## 2020-08-06 ENCOUNTER — Other Ambulatory Visit: Payer: Self-pay

## 2020-08-06 ENCOUNTER — Telehealth (INDEPENDENT_AMBULATORY_CARE_PROVIDER_SITE_OTHER): Payer: Medicare PPO | Admitting: Family Medicine

## 2020-08-06 VITALS — Ht 65.0 in | Wt 114.0 lb

## 2020-08-06 DIAGNOSIS — R519 Headache, unspecified: Secondary | ICD-10-CM

## 2020-08-06 DIAGNOSIS — R21 Rash and other nonspecific skin eruption: Secondary | ICD-10-CM | POA: Diagnosis not present

## 2020-08-06 MED ORDER — SUMATRIPTAN SUCCINATE 50 MG PO TABS: ORAL_TABLET | ORAL | 1 refills | Status: DC

## 2020-08-06 MED ORDER — DOXYCYCLINE HYCLATE 100 MG PO TABS: 100.0000 mg | ORAL_TABLET | Freq: Two times a day (BID) | ORAL | 0 refills | Status: DC

## 2020-08-06 NOTE — Progress Notes (Signed)
Virtual Visit via Video Note  I connected with [name] on 08/06/20 at  9:00 AM EDT by a video enabled telemedicine application and verified that I am speaking with the correct person using two identifiers.  Location/ persons in visit  Patient: home with husband  Provider: home    I discussed the limitations of evaluation and management by telemedicine and the availability of in person appointments. The patient expressed understanding and agreed to proceed.  History of Present Illness: Pt is c/o weakness like when she had mrsa --- she has a skin break out and is concerned it is mrsa again  No fevers    Observations/Objective: There were no vitals filed for this visit. Pt is in nAD  Skin break out on her arms  Assessment and Plan: 1. Nonintractable headache, unspecified chronicity pattern, unspecified headache type Stable   Pt needs refill  - SUMAtriptan (IMITREX) 50 MG tablet; TAKE 1 TABLET BY MOUTH EVERY 2 HOURS AS NEEDED FOR MIGRAINE OR HEADACHE, MAX OF 2 DOSES IN 24 HOURS  Dispense: 10 tablet; Refill: 1  2. Skin rash ? mrsa --- start abx and pt coming in Friday to be seen  - doxycycline (VIBRA-TABS) 100 MG tablet; Take 1 tablet (100 mg total) by mouth 2 (two) times daily.  Dispense: 20 tablet; Refill: 0   Follow Up Instructions:    I discussed the assessment and treatment plan with the patient. The patient was provided an opportunity to ask questions and all were answered. The patient agreed with the plan and demonstrated an understanding of the instructions.   The patient was advised to call back or seek an in-person evaluation if the symptoms worsen or if the condition fails to improve as anticipated.  I provided 25 minutes of non-face-to-face time during this encounter.   Donato Schultz, DO

## 2020-08-07 DIAGNOSIS — M40209 Unspecified kyphosis, site unspecified: Secondary | ICD-10-CM | POA: Diagnosis not present

## 2020-08-07 DIAGNOSIS — M5106 Intervertebral disc disorders with myelopathy, lumbar region: Secondary | ICD-10-CM | POA: Diagnosis not present

## 2020-08-07 DIAGNOSIS — M545 Low back pain: Secondary | ICD-10-CM | POA: Diagnosis not present

## 2020-08-07 DIAGNOSIS — G894 Chronic pain syndrome: Secondary | ICD-10-CM | POA: Diagnosis not present

## 2020-08-08 ENCOUNTER — Ambulatory Visit: Payer: Medicare PPO | Admitting: Family Medicine

## 2020-08-11 ENCOUNTER — Telehealth: Payer: Self-pay | Admitting: Family Medicine

## 2020-08-11 NOTE — Telephone Encounter (Signed)
Patient states she saw Dr. Laury Axon last week the antibiotic she was prescribed is not agreeing with her. She is wondering if you send something else.

## 2020-08-11 NOTE — Telephone Encounter (Signed)
Pt states taking doxycycline and states her stomach upset and making her feel woozy. Pts states some diarrhea and some nausea. Pt states taking meds with food.

## 2020-08-11 NOTE — Telephone Encounter (Signed)
D/c doxy---- take bactrim ds 1 po bid x 7 days

## 2020-08-12 ENCOUNTER — Other Ambulatory Visit: Payer: Self-pay | Admitting: Family Medicine

## 2020-08-12 DIAGNOSIS — F988 Other specified behavioral and emotional disorders with onset usually occurring in childhood and adolescence: Secondary | ICD-10-CM

## 2020-08-12 NOTE — Telephone Encounter (Signed)
Patient is completely out  Medication:  amphetamine-dextroamphetamine (ADDERALL XR) 20 MG 24 hr capsule [Pharmacy Med Name: AMPHETAMINE-DEXTROAMPHET ER 20 MG C] [[id number]]     Has the patient contacted their pharmacy?  (If no, request that the patient contact the pharmacy for the refill.) (If yes, when and what did the pharmacy advise?)     Preferred Pharmacy (with phone number or street name):   [facility] - [place], [place] - [address]  [address], [place] [address]  Phone:  [phone] Fax:  [phone]   Agent: Please be advised that RX refills may take up to 3 business days. We ask that you follow-up with your pharmacy.

## 2020-08-12 NOTE — Telephone Encounter (Signed)
Requesting: Adderall XR 20mg  Contract: 02/07/2018 UDS: 10/24/2018 Low risk Last Visit:08/06/2020 Next Visit: 08/15/2020 Last Refill: 07/11/2020 #60 and 0RF  Please Advise

## 2020-08-13 ENCOUNTER — Other Ambulatory Visit: Payer: Self-pay

## 2020-08-13 DIAGNOSIS — R300 Dysuria: Secondary | ICD-10-CM

## 2020-08-13 MED ORDER — SULFAMETHOXAZOLE-TRIMETHOPRIM 800-160 MG PO TABS: 1.0000 | ORAL_TABLET | Freq: Two times a day (BID) | ORAL | 0 refills | Status: DC

## 2020-08-13 NOTE — Telephone Encounter (Signed)
Med sent in. Pt made aware 

## 2020-08-13 NOTE — Addendum Note (Signed)
Addended by: Roxanne Gates on: 08/13/2020 09:56 AM   Modules accepted: Orders

## 2020-08-13 NOTE — Telephone Encounter (Signed)
Requesting: Adderall XR Contract: 10/24/2018 UDS: 10/24/2018 Last OV: 08/06/20 Next OV: 08/15/20 Last Refill: 08/12/2020, #60--0 RF Database:   Please advise

## 2020-08-15 ENCOUNTER — Ambulatory Visit: Payer: Medicare PPO | Admitting: Family Medicine

## 2020-08-21 ENCOUNTER — Other Ambulatory Visit: Payer: Self-pay | Admitting: Family Medicine

## 2020-08-21 DIAGNOSIS — F411 Generalized anxiety disorder: Secondary | ICD-10-CM

## 2020-08-22 ENCOUNTER — Ambulatory Visit: Payer: Medicare PPO

## 2020-09-02 ENCOUNTER — Other Ambulatory Visit: Payer: Self-pay | Admitting: Family Medicine

## 2020-09-02 DIAGNOSIS — F419 Anxiety disorder, unspecified: Secondary | ICD-10-CM

## 2020-09-02 NOTE — Telephone Encounter (Signed)
Requesting: clonazepam 0.5mg  Contract: 02/07/2018 UDS: 10/24/2018 Low risk Last Visit: 08/06/2020 Next Visit: None scheduled Last Refill: 06/30/2020 #60 and 1RF Pt sig: 1/2 to 1 tab bid prn  Please Advise

## 2020-09-03 DIAGNOSIS — S72144A Nondisplaced intertrochanteric fracture of right femur, initial encounter for closed fracture: Secondary | ICD-10-CM | POA: Diagnosis not present

## 2020-09-09 DIAGNOSIS — G894 Chronic pain syndrome: Secondary | ICD-10-CM | POA: Diagnosis not present

## 2020-09-11 ENCOUNTER — Other Ambulatory Visit: Payer: Self-pay | Admitting: Family Medicine

## 2020-09-11 DIAGNOSIS — F988 Other specified behavioral and emotional disorders with onset usually occurring in childhood and adolescence: Secondary | ICD-10-CM

## 2020-09-11 NOTE — Telephone Encounter (Signed)
Requesting: Adderall XR 20mg  Contract: 02/07/2018 UDS: 10/24/2018 Last Visit: 08/06/2020  Next Visit: 09/12/2020 Last Refill: 08/12/2020 #60 and 0RF  Please Advise

## 2020-09-12 ENCOUNTER — Ambulatory Visit: Payer: Medicare PPO | Admitting: Family Medicine

## 2020-09-12 DIAGNOSIS — Z0289 Encounter for other administrative examinations: Secondary | ICD-10-CM

## 2020-09-19 ENCOUNTER — Other Ambulatory Visit: Payer: Medicare PPO

## 2020-09-22 ENCOUNTER — Other Ambulatory Visit: Payer: Self-pay

## 2020-09-22 ENCOUNTER — Other Ambulatory Visit (INDEPENDENT_AMBULATORY_CARE_PROVIDER_SITE_OTHER): Payer: Medicare PPO

## 2020-09-22 ENCOUNTER — Ambulatory Visit: Payer: Medicare PPO | Attending: Internal Medicine

## 2020-09-22 ENCOUNTER — Other Ambulatory Visit (HOSPITAL_BASED_OUTPATIENT_CLINIC_OR_DEPARTMENT_OTHER): Payer: Self-pay | Admitting: Internal Medicine

## 2020-09-22 DIAGNOSIS — F418 Other specified anxiety disorders: Secondary | ICD-10-CM

## 2020-09-22 DIAGNOSIS — E1169 Type 2 diabetes mellitus with other specified complication: Secondary | ICD-10-CM

## 2020-09-22 DIAGNOSIS — Z23 Encounter for immunization: Secondary | ICD-10-CM

## 2020-09-22 DIAGNOSIS — E785 Hyperlipidemia, unspecified: Secondary | ICD-10-CM

## 2020-09-22 DIAGNOSIS — H353131 Nonexudative age-related macular degeneration, bilateral, early dry stage: Secondary | ICD-10-CM | POA: Diagnosis not present

## 2020-09-22 DIAGNOSIS — H524 Presbyopia: Secondary | ICD-10-CM | POA: Diagnosis not present

## 2020-09-22 DIAGNOSIS — Z862 Personal history of diseases of the blood and blood-forming organs and certain disorders involving the immune mechanism: Secondary | ICD-10-CM | POA: Diagnosis not present

## 2020-09-22 MED FILL — FLUAD QUADRIVALENT 0.5 ML P: 0.5 | 1 days supply | Qty: 1 | Fill #0

## 2020-09-22 NOTE — Progress Notes (Signed)
° °  Covid-19 Vaccination Clinic  Name:  [name]    MRN: [id number] DOB: 07/23/1946  09/22/2020  [name] was observed post Covid-19 immunization for 15 minutes without incident. She was provided with Vaccine Information Sheet and instruction to access the V-Safe system.   [name] was instructed to call 911 with any severe reactions post vaccine:  Difficulty breathing   Swelling of face and throat   A fast heartbeat   A bad rash all over body   Dizziness and weakness   Immunizations Administered    No immunizations on file.

## 2020-09-23 LAB — COMPREHENSIVE METABOLIC PANEL
ALT: 7 U/L (ref 0–35)
AST: 19 U/L (ref 0–37)
Albumin: 3.9 g/dL (ref 3.5–5.2)
Alkaline Phosphatase: 110 U/L (ref 39–117)
BUN: 16 mg/dL (ref 6–23)
CO2: 31 mEq/L (ref 19–32)
Calcium: 9.2 mg/dL (ref 8.4–10.5)
Chloride: 104 mEq/L (ref 96–112)
Creatinine, Ser: 1.09 mg/dL (ref 0.40–1.20)
GFR: 50.7 mL/min — ABNORMAL LOW (ref >60.00)
Glucose, Bld: 83 mg/dL (ref 70–99)
Potassium: 4.8 mEq/L (ref 3.5–5.1)
Sodium: 140 mEq/L (ref 135–145)
Total Bilirubin: 0.4 mg/dL (ref 0.2–1.2)
Total Protein: 6.9 g/dL (ref 6.0–8.3)

## 2020-09-23 LAB — CBC WITH DIFFERENTIAL/PLATELET
Basophils Absolute: 0 10*3/uL (ref 0.0–0.1)
Basophils Relative: 0.5 % (ref 0.0–3.0)
Eosinophils Absolute: 0.1 10*3/uL (ref 0.0–0.7)
Eosinophils Relative: 2.9 % (ref 0.0–5.0)
HCT: 38.1 % (ref 36.0–46.0)
Hemoglobin: 12.5 g/dL (ref 12.0–15.0)
Lymphocytes Relative: 46.3 % — ABNORMAL HIGH (ref 12.0–46.0)
Lymphs Abs: 1.8 10*3/uL (ref 0.7–4.0)
MCHC: 32.7 g/dL (ref 30.0–36.0)
MCV: 95.3 fl (ref 78.0–100.0)
Monocytes Absolute: 0.4 10*3/uL (ref 0.1–1.0)
Monocytes Relative: 10 % (ref 3.0–12.0)
Neutro Abs: 1.6 10*3/uL (ref 1.4–7.7)
Neutrophils Relative %: 40.3 % — ABNORMAL LOW (ref 43.0–77.0)
Platelets: 147 10*3/uL — ABNORMAL LOW (ref 150.0–400.0)
RBC: 3.99 Mil/uL (ref 3.87–5.11)
RDW: 16.2 % — ABNORMAL HIGH (ref 11.5–15.5)
WBC: 3.9 10*3/uL — ABNORMAL LOW (ref 4.0–10.5)

## 2020-09-23 LAB — LIPID PANEL
Cholesterol: 160 mg/dL (ref 0–200)
HDL: 77.6 mg/dL (ref >39.00)
LDL Cholesterol: 63 mg/dL (ref 0–99)
NonHDL: 82.02
Total CHOL/HDL Ratio: 2
Triglycerides: 97 mg/dL (ref 0.0–149.0)
VLDL: 19.4 mg/dL (ref 0.0–40.0)

## 2020-09-23 MED FILL — MODERNA COVID-19 VACCINE 10: 100 | 1 days supply | Qty: 0 | Fill #0

## 2020-09-30 ENCOUNTER — Other Ambulatory Visit: Payer: Self-pay | Admitting: Family Medicine

## 2020-09-30 DIAGNOSIS — R519 Headache, unspecified: Secondary | ICD-10-CM

## 2020-09-30 DIAGNOSIS — F419 Anxiety disorder, unspecified: Secondary | ICD-10-CM

## 2020-09-30 MED ORDER — CLONAZEPAM 0.5 MG PO TABS: 0.2500 mg | ORAL_TABLET | Freq: Two times a day (BID) | ORAL | 0 refills | Status: DC | PRN

## 2020-09-30 MED ORDER — SUMATRIPTAN SUCCINATE 50 MG PO TABS: ORAL_TABLET | ORAL | 1 refills | Status: DC

## 2020-09-30 NOTE — Telephone Encounter (Signed)
**  Pt requesting early refill due to going out of town**  Requesting: Klonopin Contract: 10/24/18 UDS: 10/24/18 Last OV: 08/06/20 Next OV: N/A Last Refill: 09/02/20, #60--0 RF Database:   Please advise

## 2020-09-30 NOTE — Telephone Encounter (Signed)
Patient states she is not due for refills till 10/03/20 however she is going out of town and was wondering if she could get refill a little earlier  clonazePAM (KLONOPIN) 0.5 MG tablet [[id number]]   SUMAtriptan (IMITREX) 50 MG tablet [[id number]]    [facility] - [place], [place] - [address]  [address], [place] [address]  Phone:  [phone] Fax:  [phone]

## 2020-10-04 DIAGNOSIS — S72144A Nondisplaced intertrochanteric fracture of right femur, initial encounter for closed fracture: Secondary | ICD-10-CM | POA: Diagnosis not present

## 2020-10-07 DIAGNOSIS — G894 Chronic pain syndrome: Secondary | ICD-10-CM | POA: Diagnosis not present

## 2020-10-17 ENCOUNTER — Other Ambulatory Visit: Payer: Self-pay | Admitting: Family Medicine

## 2020-10-17 DIAGNOSIS — F988 Other specified behavioral and emotional disorders with onset usually occurring in childhood and adolescence: Secondary | ICD-10-CM

## 2020-10-17 NOTE — Telephone Encounter (Signed)
Medication: amphetamine-dextroamphetamine (ADDERALL XR) 20 MG 24 hr capsule [[id number]]   Has the patient contacted their pharmacy? No. (If no, request that the patient contact the pharmacy for the refill.) (If yes, when and what did the pharmacy advise?)  Preferred Pharmacy (with phone number or street name):  [facility] - [place], [place] - [address]  [address], [place] [address]  Phone:  [phone] Fax:  [phone]  Agent: Please be advised that RX refills may take up to 3 business days. We ask that you follow-up with your pharmacy.

## 2020-10-20 MED ORDER — AMPHETAMINE-DEXTROAMPHET ER 20 MG PO CP24: ORAL_CAPSULE | ORAL | 0 refills | Status: DC

## 2020-10-20 NOTE — Telephone Encounter (Signed)
Requesting: Adderall XR Contract: 10/24/2018 UDS: 10/24/18 Last OV: 08/06/20 Next OV: N/A Last Refill: 09/11/20, #60--0 RF Database:   Please advise

## 2020-10-20 NOTE — Addendum Note (Signed)
Addended by: Roxanne Gates on: 10/20/2020 11:22 AM   Modules accepted: Orders

## 2020-10-27 ENCOUNTER — Telehealth: Payer: Self-pay | Admitting: Family Medicine

## 2020-10-27 ENCOUNTER — Other Ambulatory Visit: Payer: Self-pay | Admitting: Family Medicine

## 2020-10-27 DIAGNOSIS — F419 Anxiety disorder, unspecified: Secondary | ICD-10-CM

## 2020-10-27 DIAGNOSIS — R519 Headache, unspecified: Secondary | ICD-10-CM

## 2020-10-27 MED ORDER — SUMATRIPTAN SUCCINATE 50 MG PO TABS: ORAL_TABLET | ORAL | 1 refills | Status: DC

## 2020-10-27 MED ORDER — CLONAZEPAM 0.5 MG PO TABS: 0.2500 mg | ORAL_TABLET | Freq: Two times a day (BID) | ORAL | 0 refills | Status: DC | PRN

## 2020-10-27 NOTE — Addendum Note (Signed)
Addended by: Roxanne Gates on: 10/27/2020 04:27 PM   Modules accepted: Orders

## 2020-10-27 NOTE — Telephone Encounter (Signed)
Patient is requesting a refill for medication now  Due to her grandsons passing over the weekend.   Medication: clonazePAM (KLONOPIN) 0.5 MG tablet    Has the patient contacted their pharmacy? No. (If no, request that the patient contact the pharmacy for the refill.) (If yes, when and what did the pharmacy advise?)  Preferred Pharmacy (with phone number or street name): [facility] - [place], [place] - [address]  [address], [place] [address]  Phone:  [phone] Fax:  [phone]  DEA #:  --  Agent: Please be advised that RX refills may take up to 3 business days. We ask that you follow-up with your pharmacy.

## 2020-10-27 NOTE — Telephone Encounter (Signed)
Refill sent.

## 2020-10-27 NOTE — Telephone Encounter (Signed)
**  Pt states grandson passed away over the weekend**  Requesting: Klonopin Contract: 10/24/2018 UDS: 10/24/18 Last OV: 08/06/20 Next OV: N/A Last Refill: 09/30/20, #60--0 RF Database:   Please advise

## 2020-10-27 NOTE — Telephone Encounter (Signed)
[facility] did not get the faz, please send again per pt request.

## 2020-10-27 NOTE — Telephone Encounter (Signed)
Medication:  SUMAtriptan (IMITREX) 50 MG tablet [[id number]]    furosemide (LASIX) 40 MG tablet [[id number]]   Has the patient contacted their pharmacy? No. (If no, request that the patient contact the pharmacy for the refill.) (If yes, when and what did the pharmacy advise?)  Preferred Pharmacy (with phone number or street name): [facility] - [place], [place] - [address]  [address], [place] [address]  Phone:  [phone] Fax:  [phone]  DEA #:  --  Agent: Please be advised that RX refills may take up to 3 business days. We ask that you follow-up with your pharmacy.

## 2020-10-27 NOTE — Telephone Encounter (Signed)
Refill sent again

## 2020-11-03 DIAGNOSIS — S72144A Nondisplaced intertrochanteric fracture of right femur, initial encounter for closed fracture: Secondary | ICD-10-CM | POA: Diagnosis not present

## 2020-11-06 DIAGNOSIS — M5106 Intervertebral disc disorders with myelopathy, lumbar region: Secondary | ICD-10-CM | POA: Diagnosis not present

## 2020-11-06 DIAGNOSIS — M40209 Unspecified kyphosis, site unspecified: Secondary | ICD-10-CM | POA: Diagnosis not present

## 2020-11-06 DIAGNOSIS — M4120 Other idiopathic scoliosis, site unspecified: Secondary | ICD-10-CM | POA: Diagnosis not present

## 2020-11-06 DIAGNOSIS — G894 Chronic pain syndrome: Secondary | ICD-10-CM | POA: Diagnosis not present

## 2020-11-21 ENCOUNTER — Other Ambulatory Visit: Payer: Self-pay | Admitting: Family Medicine

## 2020-11-21 DIAGNOSIS — F988 Other specified behavioral and emotional disorders with onset usually occurring in childhood and adolescence: Secondary | ICD-10-CM

## 2020-11-21 NOTE — Telephone Encounter (Signed)
Requesting: Adderall XR 20mg  Contract: 02/07/2018 UDS: 10/24/2018 Last Visit: 08/06/2020 Next Visit: None Last Refill: 10/20/2020 #60 and 0RF  Please Advise

## 2020-11-26 ENCOUNTER — Other Ambulatory Visit: Payer: Self-pay | Admitting: Family Medicine

## 2020-11-26 DIAGNOSIS — F419 Anxiety disorder, unspecified: Secondary | ICD-10-CM

## 2020-11-26 NOTE — Telephone Encounter (Signed)
Requesting: clonazepam 0.5mg  Contract: 02/07/2018 UDS: 10/24/2018 Last Visit: 08/06/2020 Next Visit: None Last Refill: 10/27/2020 #60 and 0RF   Please Advise

## 2020-12-04 DIAGNOSIS — S72144A Nondisplaced intertrochanteric fracture of right femur, initial encounter for closed fracture: Secondary | ICD-10-CM | POA: Diagnosis not present

## 2020-12-05 DIAGNOSIS — G894 Chronic pain syndrome: Secondary | ICD-10-CM | POA: Diagnosis not present

## 2020-12-23 ENCOUNTER — Other Ambulatory Visit: Payer: Self-pay | Admitting: Family Medicine

## 2020-12-23 DIAGNOSIS — F988 Other specified behavioral and emotional disorders with onset usually occurring in childhood and adolescence: Secondary | ICD-10-CM

## 2020-12-23 NOTE — Telephone Encounter (Signed)
Requesting: Adderall XR 20mg  Contract: 02/07/2018 UDS: 10/24/2018 Last Visit: 08/06/2020 Next Visit: None Last Refill: 11/21/2020 #60 and 0RF Pt sig: 2 capsules daily  Please Advise

## 2020-12-25 DIAGNOSIS — G894 Chronic pain syndrome: Secondary | ICD-10-CM | POA: Diagnosis not present

## 2020-12-25 DIAGNOSIS — M5106 Intervertebral disc disorders with myelopathy, lumbar region: Secondary | ICD-10-CM | POA: Diagnosis not present

## 2020-12-25 DIAGNOSIS — M542 Cervicalgia: Secondary | ICD-10-CM | POA: Diagnosis not present

## 2020-12-25 DIAGNOSIS — M40209 Unspecified kyphosis, site unspecified: Secondary | ICD-10-CM | POA: Diagnosis not present

## 2020-12-25 DIAGNOSIS — M4120 Other idiopathic scoliosis, site unspecified: Secondary | ICD-10-CM | POA: Diagnosis not present

## 2020-12-25 DIAGNOSIS — M4722 Other spondylosis with radiculopathy, cervical region: Secondary | ICD-10-CM | POA: Diagnosis not present

## 2021-01-20 ENCOUNTER — Other Ambulatory Visit: Payer: Self-pay | Admitting: Family Medicine

## 2021-01-20 DIAGNOSIS — F419 Anxiety disorder, unspecified: Secondary | ICD-10-CM

## 2021-01-20 DIAGNOSIS — F988 Other specified behavioral and emotional disorders with onset usually occurring in childhood and adolescence: Secondary | ICD-10-CM

## 2021-01-20 NOTE — Telephone Encounter (Signed)
Requesting: Adderall Contract: 02/07/18 UDS: 10/24/18 Last Visit: 04/22/20 (video) Next Visit: none Last Refill: 12/23/20  Requesting: Clonazepam  Contract: 02/07/18 UDS: 10/24/18 Last Visit: 04/22/18 (video) Next Visit: none Last Refill: 11/26/20  Please Advise

## 2021-02-03 DIAGNOSIS — N3000 Acute cystitis without hematuria: Secondary | ICD-10-CM | POA: Diagnosis not present

## 2021-02-03 DIAGNOSIS — R1032 Left lower quadrant pain: Secondary | ICD-10-CM | POA: Diagnosis not present

## 2021-02-05 DIAGNOSIS — M40209 Unspecified kyphosis, site unspecified: Secondary | ICD-10-CM | POA: Diagnosis not present

## 2021-02-05 DIAGNOSIS — G894 Chronic pain syndrome: Secondary | ICD-10-CM | POA: Diagnosis not present

## 2021-02-05 DIAGNOSIS — M4120 Other idiopathic scoliosis, site unspecified: Secondary | ICD-10-CM | POA: Diagnosis not present

## 2021-02-05 DIAGNOSIS — M5106 Intervertebral disc disorders with myelopathy, lumbar region: Secondary | ICD-10-CM | POA: Diagnosis not present

## 2021-02-13 ENCOUNTER — Other Ambulatory Visit: Payer: Self-pay | Admitting: Family Medicine

## 2021-02-13 DIAGNOSIS — R519 Headache, unspecified: Secondary | ICD-10-CM

## 2021-02-13 DIAGNOSIS — F988 Other specified behavioral and emotional disorders with onset usually occurring in childhood and adolescence: Secondary | ICD-10-CM

## 2021-02-13 NOTE — Telephone Encounter (Signed)
Requesting: ADDERALL XR Contract: 10/24/18 UDS: 10/24/18 Last Visit: 08/06/20 Next Visit: NONE Last Refill: 01/21/21  Please Advise

## 2021-02-19 ENCOUNTER — Other Ambulatory Visit: Payer: Self-pay | Admitting: Family Medicine

## 2021-02-19 DIAGNOSIS — F419 Anxiety disorder, unspecified: Secondary | ICD-10-CM

## 2021-02-19 NOTE — Telephone Encounter (Signed)
Requesting: Clonazepam Contract: 02/07/2018 needs updated csc UDS: 10/24/2018 Last Visit:10/16/2019 Next Visit:none scheduled Last Refill:01/21/2021  Please Advise

## 2021-02-20 ENCOUNTER — Telehealth: Payer: Self-pay | Admitting: Family Medicine

## 2021-02-20 NOTE — Telephone Encounter (Signed)
This provider contacted by on-call service regarding patient request for refill on Klonopin.  Per chart review telephone encounter on 02/19/2021 requesting medication.  Patient advised to contact clinic on Monday as this provider would be unable to refill the medication.

## 2021-02-23 ENCOUNTER — Telehealth: Payer: Self-pay

## 2021-02-23 NOTE — Telephone Encounter (Signed)
Pharmacy will fill 10 tabs today, the rest tomorrow.  Called to inform pt- her husband had her phone and will have her rtn call to office since he was not on DPR form.

## 2021-02-23 NOTE — Telephone Encounter (Signed)
Chief Complaint Prescription Refill or Medication Request (non symptomatic) Reason for Call Medication Question / Request Initial Comment Caller states that her Klonopin is out and she is very concerned because she cannot go without this medication until Monday. The pharmacy already tried to request the refill and didn't get a response. Translation No Nurse Assessment Nurse: Elesa Hacker, RN, Nash Dimmer Date/Time Lamount Cohen Time): 02/20/2021 12:22:18 PM Confirm and document reason for call. If symptomatic, describe symptoms. ---Caller states that her Scarlette Calico is out and she is very concerned because she cannot go without this medication until Monday. The pharmacy already tried to request the refill and didn't get a response. Caller advised that she has unexpectedly lost her grandson , only has 1/2 pill left.

## 2021-03-06 ENCOUNTER — Encounter (HOSPITAL_BASED_OUTPATIENT_CLINIC_OR_DEPARTMENT_OTHER): Payer: Self-pay | Admitting: Emergency Medicine

## 2021-03-06 ENCOUNTER — Ambulatory Visit: Payer: Medicare PPO | Admitting: Family

## 2021-03-06 ENCOUNTER — Emergency Department (HOSPITAL_BASED_OUTPATIENT_CLINIC_OR_DEPARTMENT_OTHER): Payer: Medicare PPO

## 2021-03-06 ENCOUNTER — Other Ambulatory Visit: Payer: Self-pay

## 2021-03-06 ENCOUNTER — Emergency Department (HOSPITAL_BASED_OUTPATIENT_CLINIC_OR_DEPARTMENT_OTHER)
Admission: EM | Admit: 2021-03-06 | Discharge: 2021-03-06 | Disposition: A | Discharge status: Home / Self Care | Payer: Medicare PPO | Attending: Emergency Medicine | Admitting: Emergency Medicine

## 2021-03-06 VITALS — BP 90/50 | HR 64 | Temp 97.6°F

## 2021-03-06 DIAGNOSIS — R630 Anorexia: Secondary | ICD-10-CM | POA: Diagnosis not present

## 2021-03-06 DIAGNOSIS — I10 Essential (primary) hypertension: Secondary | ICD-10-CM | POA: Insufficient documentation

## 2021-03-06 DIAGNOSIS — R1032 Left lower quadrant pain: Secondary | ICD-10-CM

## 2021-03-06 DIAGNOSIS — R197 Diarrhea, unspecified: Secondary | ICD-10-CM | POA: Insufficient documentation

## 2021-03-06 DIAGNOSIS — I700 Atherosclerosis of aorta: Secondary | ICD-10-CM | POA: Diagnosis not present

## 2021-03-06 DIAGNOSIS — R112 Nausea with vomiting, unspecified: Secondary | ICD-10-CM | POA: Insufficient documentation

## 2021-03-06 DIAGNOSIS — R1111 Vomiting without nausea: Secondary | ICD-10-CM | POA: Diagnosis not present

## 2021-03-06 DIAGNOSIS — R110 Nausea: Secondary | ICD-10-CM

## 2021-03-06 DIAGNOSIS — N179 Acute kidney failure, unspecified: Secondary | ICD-10-CM | POA: Insufficient documentation

## 2021-03-06 LAB — URINALYSIS, ROUTINE W REFLEX MICROSCOPIC
Bilirubin Urine: NEGATIVE
Glucose, UA: NEGATIVE mg/dL
Hgb urine dipstick: NEGATIVE
Ketones, ur: NEGATIVE mg/dL
Leukocytes,Ua: NEGATIVE
Nitrite: NEGATIVE
Protein, ur: NEGATIVE mg/dL
Specific Gravity, Urine: <1.005 — ABNORMAL LOW (ref 1.005–1.030)
pH: 5.5 (ref 5.0–8.0)

## 2021-03-06 LAB — LACTIC ACID, PLASMA: Lactic Acid, Venous: 1.2 mmol/L (ref 0.5–1.9)

## 2021-03-06 LAB — CBC WITH DIFFERENTIAL/PLATELET
Abs Immature Granulocytes: 0.04 10*3/uL (ref 0.00–0.07)
Basophils Absolute: 0.1 10*3/uL (ref 0.0–0.1)
Basophils Relative: 1 %
Eosinophils Absolute: 0 10*3/uL (ref 0.0–0.5)
Eosinophils Relative: 0 %
HCT: 46.6 % — ABNORMAL HIGH (ref 36.0–46.0)
Hemoglobin: 15.3 g/dL — ABNORMAL HIGH (ref 12.0–15.0)
Immature Granulocytes: 1 %
Lymphocytes Relative: 36 %
Lymphs Abs: 2.5 10*3/uL (ref 0.7–4.0)
MCH: 31.9 pg (ref 26.0–34.0)
MCHC: 32.8 g/dL (ref 30.0–36.0)
MCV: 97.3 fL (ref 80.0–100.0)
Monocytes Absolute: 0.6 10*3/uL (ref 0.1–1.0)
Monocytes Relative: 9 %
Neutro Abs: 3.7 10*3/uL (ref 1.7–7.7)
Neutrophils Relative %: 53 %
Platelets: 205 10*3/uL (ref 150–400)
RBC: 4.79 MIL/uL (ref 3.87–5.11)
RDW: 13 % (ref 11.5–15.5)
WBC: 6.8 10*3/uL (ref 4.0–10.5)
nRBC: 0 % (ref 0.0–0.2)

## 2021-03-06 LAB — COMPREHENSIVE METABOLIC PANEL
ALT: 11 U/L (ref 0–44)
AST: 20 U/L (ref 15–41)
Albumin: 4.4 g/dL (ref 3.5–5.0)
Alkaline Phosphatase: 122 U/L (ref 38–126)
Anion gap: 12 (ref 5–15)
BUN: 28 mg/dL — ABNORMAL HIGH (ref 8–23)
CO2: 23 mmol/L (ref 22–32)
Calcium: 9.4 mg/dL (ref 8.9–10.3)
Chloride: 98 mmol/L (ref 98–111)
Creatinine, Ser: 1.73 mg/dL — ABNORMAL HIGH (ref 0.44–1.00)
GFR, Estimated: 31 mL/min — ABNORMAL LOW (ref >60)
Glucose, Bld: 102 mg/dL — ABNORMAL HIGH (ref 70–99)
Potassium: 3 mmol/L — ABNORMAL LOW (ref 3.5–5.1)
Sodium: 133 mmol/L — ABNORMAL LOW (ref 135–145)
Total Bilirubin: 0.6 mg/dL (ref 0.3–1.2)
Total Protein: 8 g/dL (ref 6.5–8.1)

## 2021-03-06 LAB — LIPASE, BLOOD: Lipase: 59 U/L — ABNORMAL HIGH (ref 11–51)

## 2021-03-06 MED ORDER — ONDANSETRON HCL 4 MG/2ML IJ SOLN
4.0000 mg | Freq: Once | INTRAMUSCULAR | Status: AC
  Administered 2021-03-06: 4 mg via INTRAVENOUS
  Filled 2021-03-06: qty 2

## 2021-03-06 MED ORDER — OXYCODONE-ACETAMINOPHEN 5-325 MG PO TABS
1.0000 | ORAL_TABLET | Freq: Once | ORAL | Status: AC
Start: 2021-03-06 — End: 2021-03-06
  Administered 2021-03-06: 1 via ORAL
  Filled 2021-03-06: qty 1

## 2021-03-06 MED ORDER — LACTATED RINGERS IV BOLUS
1000.0000 mL | Freq: Once | INTRAVENOUS | Status: AC
  Administered 2021-03-06: 1000 mL via INTRAVENOUS

## 2021-03-06 MED ORDER — ACETAMINOPHEN 325 MG PO TABS
650.0000 mg | ORAL_TABLET | Freq: Once | ORAL | Status: AC
  Administered 2021-03-06: 650 mg via ORAL
  Filled 2021-03-06: qty 2

## 2021-03-06 MED ORDER — ONDANSETRON 4 MG PO TBDP: 4.0000 mg | ORAL_TABLET | Freq: Three times a day (TID) | ORAL | 0 refills | Status: DC | PRN

## 2021-03-06 NOTE — ED Triage Notes (Signed)
Reports LLQ abdominal pain with n/v/d that started Monday.  Reports no n/v today.  Able to keep fluids down but not much food.

## 2021-03-06 NOTE — Discharge Instructions (Signed)
Return tomorrow or go to primary care for reassessment of hydration status to include testing of your kidney function.  Hydrate well overnight.  Return to [place] right away with any concerning changes.  You can take the antiemetics provided for nausea as well.

## 2021-03-06 NOTE — ED Provider Notes (Signed)
[facility] EMERGENCY DEPARTMENT Provider Note   CSN: [id number] Arrival date & time: 03/06/21  6759     History Chief Complaint  Patient presents with  . Abdominal Pain    [name] is a 75 y.o. female.  The history is provided by the patient and the spouse.  Abdominal Pain Pain location:  LLQ Pain quality: fullness (feels like a softball)   Pain severity:  Moderate Onset quality:  Gradual Duration:  4 days Timing:  Constant Progression:  Worsening Chronicity:  New Context comment:  Has diarrhea Relieved by:  Nothing Worsened by:  Nothing Ineffective treatments:  None tried Associated symptoms: anorexia, diarrhea, nausea and vomiting   Associated symptoms: no chest pain, no chills, no cough, no dysuria, no fever, no hematuria and no shortness of breath        Past Medical History:  Diagnosis Date  . Abrasion of right heel    during admission 10/ 2019 right heel open skin due to movement on sheet  . ADD (attention deficit disorder)   . Anemia, mild   . Anxiety   . Arthritis    "joints, back"  . At high risk for falls    10-19-2018 pt has fell twice in past week, tripped  . Chronic back pain    "all over my back" (10/02/2013)  . Crushing injury of arm, right 02/06/1989's   "it was crushed; wore cast from fingers to top of my shoulder" (11/25/2  . Crushing injury of left wrist 05/11/1989's  . DOE (dyspnea on exertion)   . Essential tremor   . Fibromyalgia   . History of palpitations 2002   recurrent  . History of panic attacks   . History of pneumonia 08/21/2018   w/ acute respiratory failure/ hypoxia  . History of sepsis    admission 08-21-2018  secondary to uropathy obstructive from ureteral stone   . Hyperlipidemia   . Hypertension    10-19-2018 PER PT AMLODIPINE ON HOLD SINCE 10/ 2019 DUE TO KIDNEY ISSUES PER DOCTOR  . Idiopathic scoliosis 04/19/2013  . Left ureteral stone   . Migraines    "once/month" (10/02/2013)  . Osteoporosis   .  Other vitamin B12 deficiency anemia   . PONV (postoperative nausea and vomiting)    As a child  . Pulmonary nodules/lesions, multiple   . Renal insufficiency   . S/P gastric bypass 09/16/2003  . S/P insertion of spinal cord stimulator    per pt remote is missing  . Wears dentures    upper  . Wears glasses   . Wears glasses     Patient Active Problem List   Diagnosis Date Noted  . Dysuria 10/16/2019  . Suspected COVID-19 virus infection 10/16/2019  . Closed nondisplaced intertrochanteric fracture of right femur (HCC) 05/31/2019  . Closed displaced comminuted supracondylar fracture of left humerus without intercondylar fracture 04/17/2019  . Nonfunctioning kidney 12/25/2018  . Hydronephrosis with ureteral stricture, not elsewhere classified 12/21/2018  . Malnutrition of moderate degree 12/08/2018  . Delirium 12/07/2018  . Hydronephrosis of left kidney 12/07/2018  . Pyelonephritis 12/07/2018  . Protein-calorie malnutrition, severe 08/24/2018  . Pressure injury of skin 08/22/2018  . Obstruction of left ureteropelvic junction (UPJ) due to stone 08/21/2018  . Hypoalbuminemia 08/21/2018  . Chronic pain disorder 08/21/2018  . Thrombocytopenia (HCC) 08/21/2018  . Acute renal failure (ARF) (HCC) 08/21/2018  . Compression fracture of spine (HCC) 08/21/2018  . Acute respiratory failure with hypoxemia (HCC)   . Hematoma 06/03/2018  .  Dysphagia 02/07/2018  . Generalized anxiety disorder 07/04/2017  . Nonintractable headache 07/04/2017  . Exertional dyspnea 08/05/2016  . Sepsis (HCC) 03/07/2015  . Hypokalemia 03/07/2015  . Acute encephalopathy 03/06/2015  . Fever 03/06/2015  . Chronic back pain 01/15/2015  . Obesity (BMI 30-39.9) 10/15/2013  . Pulmonary nodules 10/15/2013  . CAP (community acquired pneumonia) 10/02/2013  . Essential hypertension 08/24/2013  . Idiopathic scoliosis 04/19/2013  . Back pain 04/19/2013  . Osteoporosis 04/19/2013  . Edema 04/19/2013  . Mild diastolic  dysfunction 01/01/2013  . Leg pain, bilateral 12/16/2012  . ADD (attention deficit disorder) 05/24/2012  . Involuntary movements 05/24/2012  . DIZZINESS 12/31/2009  . TREMOR, ESSENTIAL 12/12/2009  . PALPITATIONS, RECURRENT 12/12/2009  . OTHER VITAMIN B12 DEFICIENCY ANEMIA 10/30/2009  . FEMALE STRESS INCONTINENCE 10/28/2009  . MEMORY LOSS 10/28/2009  . Morbid obesity (HCC) 12/03/2008  . GERD 12/03/2008  . ANEMIA, MILD 06/26/2008  . DEPRESSION/ANXIETY 12/28/2007  . PICA 12/28/2007  . ACUTE BRONCHITIS 12/28/2007  . FATIGUE 12/28/2007  . Hyperlipidemia 05/15/2007  . ARTIFICIAL MENOPAUSE 05/15/2007  . Myalgia and myositis, unspecified 05/15/2007  . HEADACHE 05/15/2007  . CHEST PAIN, ATYPICAL 05/15/2007  . SYMPTOM, PAIN, ABDOMINAL, EPIGASTRIC 05/15/2007  . REDUCTION MAMMOPLASTY, HX OF 05/15/2007  . PSTPRC STATUS, BARIATRIC SURGERY 05/15/2007    Past Surgical History:  Procedure Laterality Date  . ABDOMINAL HYSTERECTOMY  1980's   W/  BSO  AND APPENDECTOMY  . CARDIAC CATHETERIZATION  12-13-2002    dr Eden Emms   normal coronaries  . CYSTOSCOPY WITH URETEROSCOPY AND STENT PLACEMENT Left 09/27/2018   Procedure: LEFT  URETEROSCOPY, HOLMIUM LASER LITHOTRIPSY;  Surgeon: Crist Fat, MD;  Location: [facility];  Service: Urology;  Laterality: Left;  . D & C HYSTERSCOPY RESECTION MYOMECTOMY  1980s  . HUMERUS FRACTURE SURGERY Left 04/17/2019    Comminuted complex left intra-articular distal humerus fracture/supracondylar humerus fracture displaced  . INTRAMEDULLARY (IM) NAIL INTERTROCHANTERIC Right 06/01/2019   Procedure: INTRAMEDULLARY (IM) NAIL INTERTROCHANTRIC;  Surgeon: Ollen Gross, MD;  Location: [facility];  Service: Orthopedics;  Laterality: Right;  . IR NEPHROSTOMY PLACEMENT LEFT  08/22/2018  . IR NEPHROSTOMY PLACEMENT LEFT  12/08/2018  . KNEE ARTHROSCOPY Right 03-31-2001    dr Simonne Come @[facility]   . NEPHROLITHOTOMY Left 10/20/2018   Procedure: LEFT NEPHROLITHOTOMY PERCUTANEOUS;  Surgeon:  10/22/2018, MD;  Location: [facility];  Service: Urology;  Laterality: Left;  . ORIF HUMERUS FRACTURE Left 04/17/2019   Procedure: Open reduction internal fixation left supracondylar humerus fracture with olecranon osteotomy and ulnar nerve release and anterior transposition and repair of structures as necessary;  Surgeon: 06/17/2019, MD;  Location: [facility];  Service: Orthopedics;  Laterality: Left;  3 hrs  . REDUCTION MAMMAPLASTY Bilateral 2001  . ROBOT ASSISTED LAPAROSCOPIC NEPHRECTOMY Left 12/25/2018   Procedure: XI ROBOTIC ASSISTED LAPAROSCOPIC NEPHRECTOMY, LEFT FLANK EXPLORATION WITH REMOVAL OF BATTERY PACK;  Surgeon: 12/27/2018, MD;  Location: [facility];  Service: Urology;  Laterality: Left;  . ROUX-EN-Y GASTRIC BYPASS  09-16-2003   dr 13-06-2003. Judie Petit  @[facility]    via Laparoscopy w/ gastrojejunostomy  . SPINAL CORD STIMULATOR IMPLANT  2016   10-19-2018  PER PT HAS REMOTE BUT HAS NOT USED IT SINCE DEC 2018  . TONSILLECTOMY AND ADENOIDECTOMY  1955  . TUBAL LIGATION  1980's     OB History   No obstetric history on file.     Family History  Adopted: Yes  Problem Relation Age of Onset  . COPD Mother   .  Emphysema Mother   . Heart attack Mother   . Other Mother        tobacco abuse    Social History   Tobacco Use  . Smoking status: Never Smoker  . Smokeless tobacco: Never Used  Vaping Use  . Vaping Use: Never used  Substance Use Topics  . Alcohol use: No  . Drug use: No    Home Medications Prior to Admission medications   Medication Sig Start Date End Date Taking? Authorizing Provider  acetaminophen (TYLENOL) 500 MG tablet Take 2,000 mg by mouth 3 (three) times daily as needed for moderate pain.   Yes [provider]  amphetamine-dextroamphetamine (ADDERALL XR) 20 MG 24 hr capsule TAKE TWO CAPSULES BY MOUTH EACH DAY 02/13/21  Yes Zola ButtonLowne Chase, [name], DO  clonazePAM (KLONOPIN) 0.5 MG tablet TAKE 1/2 TO 1 TABLET BY MOUTH TWO TIMES DAILY AS NEEDED FOR ANXIETY  02/23/21  Yes Seabron SpatesLowne Chase, [name], DO  ondansetron (ZOFRAN ODT) 4 MG disintegrating tablet Take 1 tablet (4 mg total) by mouth every 8 (eight) hours as needed for up to 10 doses for nausea or vomiting. 03/06/21  Yes Sabino DonovanKatz, Niala Stcharles C, MD  oxyCODONE (ROXICODONE) 15 MG immediate release tablet Take 1 tablet (15 mg total) by mouth 4 (four) times daily. Patient taking differently: Take 15 mg by mouth 5 (five) times daily. 04/19/19  Yes Dominica SeverinGramig, William, MD  SUMAtriptan (IMITREX) 50 MG tablet TAKE 1 TABLET BY MOUTH EVCERY 2 HOURS ASNEEDED FOR MIGRAINE. MA XOF 2 DOSES IN 24 HOURS 02/13/21  Yes Seabron SpatesLowne Chase, [name], DO  venlafaxine XR (EFFEXOR-XR) 75 MG 24 hr capsule TAKE 2 CAPSULES BY MOUTH DAILY 08/23/20  Yes Seabron SpatesLowne Chase, [name], DO  COVID-19 mRNA vaccine, Moderna, 100 MCG/0.5ML injection INJECT AS DIRECTED 09/22/20 09/22/21  Judyann MunsonSnider, Cynthia, MD  influenza vaccine adjuvanted (FLUAD) 0.5 ML injection INJECT AS DIRECTED 09/22/20 09/22/21  Judyann MunsonSnider, Cynthia, MD  neomycin-bacitracin-polymyxin (NEOSPORIN) ointment Apply 1 application topically daily as needed for wound care.    [provider]  sulfamethoxazole-trimethoprim (BACTRIM DS) 800-160 MG tablet Take 1 tablet by mouth 2 (two) times daily. 08/13/20   Donato SchultzLowne Chase, [name], DO    Allergies    Zanaflex [tizanidine] and Adhesive [tape]  Review of Systems   Review of Systems  Constitutional: Positive for unexpected weight change (8lbs). Negative for chills and fever.  HENT: Negative for congestion and rhinorrhea.   Respiratory: Negative for cough and shortness of breath.   Cardiovascular: Negative for chest pain and palpitations.  Gastrointestinal: Positive for abdominal pain, anorexia, diarrhea, nausea and vomiting.  Genitourinary: Negative for difficulty urinating, dysuria and hematuria.  Musculoskeletal: Negative for arthralgias and back pain.  Skin: Negative for rash and wound.  Neurological: Negative for light-headedness and headaches.     Physical Exam Updated Vital Signs BP 105/81   Pulse 76   Temp 97.7 F (36.5 C) (Oral)   Resp (!) 24   Ht 5\' 5"  (1.651 m)   Wt 51.7 kg   SpO2 99%   BMI 18.97 kg/m   Physical Exam Vitals and nursing note reviewed. Exam conducted with a chaperone present.  Constitutional:      General: She is not in acute distress.    Appearance: Normal appearance. She is cachectic. She is ill-appearing.  HENT:     Head: Normocephalic and atraumatic.     Nose: No rhinorrhea.  Eyes:     General:        Right eye: No  discharge.        Left eye: No discharge.     Conjunctiva/sclera: Conjunctivae normal.  Cardiovascular:     Rate and Rhythm: Normal rate and regular rhythm.  Pulmonary:     Effort: Pulmonary effort is normal. No respiratory distress.     Breath sounds: No stridor.  Abdominal:     General: Abdomen is scaphoid. A surgical scar is present. There is no distension.     Palpations: Abdomen is soft.     Tenderness: There is abdominal tenderness in the suprapubic area, left upper quadrant and left lower quadrant. There is no guarding or rebound.  Musculoskeletal:        General: No tenderness or signs of injury.  Skin:    General: Skin is warm and dry.  Neurological:     General: No focal deficit present.     Mental Status: She is alert. Mental status is at baseline.     Motor: No weakness.  Psychiatric:        Mood and Affect: Mood normal.        Behavior: Behavior normal.     ED Results / Procedures / Treatments   Labs (all labs ordered are listed, but only abnormal results are displayed) Labs Reviewed  CBC WITH DIFFERENTIAL/PLATELET - Abnormal; Notable for the following components:      Result Value   Hemoglobin 15.3 (*)    HCT 46.6 (*)    All other components within normal limits  COMPREHENSIVE METABOLIC PANEL - Abnormal; Notable for the following components:   Sodium 133 (*)    Potassium 3.0 (*)    Glucose, Bld 102 (*)    BUN 28 (*)    Creatinine, Ser 1.73 (*)     GFR, Estimated 31 (*)    All other components within normal limits  LIPASE, BLOOD - Abnormal; Notable for the following components:   Lipase 59 (*)    All other components within normal limits  URINALYSIS, ROUTINE W REFLEX MICROSCOPIC - Abnormal; Notable for the following components:   Specific Gravity, Urine <1.005 (*)    All other components within normal limits  LACTIC ACID, PLASMA    EKG EKG Interpretation  Date/Time:  Friday March 06 2021 16:12:44 EDT Ventricular Rate:  91 PR Interval:  152 QRS Duration: 81 QT Interval:  364 QTC Calculation: 448 R Axis:   -24 Text Interpretation: Sinus rhythm Consider left ventricular hypertrophy Confirmed by Cherlynn Perches ([id number]) on 03/06/2021 4:41:03 PM   Radiology CT ABDOMEN PELVIS WO CONTRAST  Result Date: 03/06/2021 CLINICAL DATA:  Left lower quadrant pain. Nausea, vomiting, diarrhea EXAM: CT ABDOMEN AND PELVIS WITHOUT CONTRAST TECHNIQUE: Multidetector CT imaging of the abdomen and pelvis was performed following the standard protocol without IV contrast. COMPARISON:  12/07/2020 FINDINGS: Lower chest: Right middle lobe scarring. Hepatobiliary: No focal liver abnormality is seen. No gallstones, gallbladder wall thickening, or biliary dilatation. Pancreas: Unremarkable. No pancreatic ductal dilatation or surrounding inflammatory changes. Spleen: Normal in size without focal abnormality. Adrenals/Urinary Tract: Normal adrenal glands. Prior left nephrectomy. Normal right kidney. No obstructive uropathy. No urolithiasis. Normal bladder. Stomach/Bowel: Prior gastric bypass. No evidence of bowel wall thickening, distention, or inflammatory changes. Vascular/Lymphatic: Normal caliber abdominal aorta with mild atherosclerosis. No lymphadenopathy. Reproductive: Status post hysterectomy. No adnexal masses. Other: No abdominal wall hernia or abnormality. No abdominopelvic ascites. Musculoskeletal: No acute osseous abnormality. No aggressive osseous lesion.  Chronic T12, L1, L2, L3, and L4 vertebral body compression. Ankylosis of the posterior elements from  L1 through L5. Prior right hip ORIF. IMPRESSION: 1. No acute abdominal or pelvic pathology. 2. Prior left nephrectomy. 3. Aortic Atherosclerosis (ICD10-I70.0). Electronically Signed   By: Elige Ko   On: 03/06/2021 18:57    Procedures Procedures   Medications Ordered in ED Medications  lactated ringers bolus 1,000 mL (0 mLs Intravenous Stopped 03/06/21 1800)  oxyCODONE-acetaminophen (PERCOCET/ROXICET) 5-325 MG per tablet 1 tablet (1 tablet Oral Given 03/06/21 1652)  ondansetron (ZOFRAN) injection 4 mg (4 mg Intravenous Given 03/06/21 1652)  lactated ringers bolus 1,000 mL (1,000 mLs Intravenous New Bag/Given 03/06/21 1812)  acetaminophen (TYLENOL) tablet 650 mg (650 mg Oral Given 03/06/21 1815)    ED Course  I have reviewed the triage vital signs and the nursing notes.  Pertinent labs & imaging results that were available during my care of the patient were reviewed by me and considered in my medical decision making (see chart for details).    MDM Rules/Calculators/A&P                          Ill-appearing woman with masslike sensation in her left lower quadrant having diarrhea.  We will need to have screening laboratory studies and likely imaging.  Patient reports weight loss likely secondary to volume loss in the setting of diarrhea.  Will check kidney function prior to order imaging.  Pain control given per chronic pain needs.  IV fluids antiemetics.  Patient looks much better after fluid hydration.  Feeling much better.  After laboratory analysis only significant derangements are mild hypokalemia and likely AKI secondary to volume loss in the setting of diarrhea.  She is offered admission for further hydration and further lab testing.  But they insist they must be discharged as they have a small child to take care of at home.  I advised him that this is not the safest option but is  reasonable as long as they will get checked in the morning.  They agree for discharge and reassessment tomorrow.  Zofran is provided.  Return precautions discussed.  I evaluated the CT as well as radiology.  There are no significant findings. Final Clinical Impression(s) / ED Diagnoses Final diagnoses:  Diarrhea in adult patient  Nausea in adult  AKI (acute kidney injury) (HCC)    Rx / DC Orders ED Discharge Orders         Ordered    ondansetron (ZOFRAN ODT) 4 MG disintegrating tablet  Every 8 hours PRN        03/06/21 1924           Sabino Donovan, MD 03/06/21 1925

## 2021-03-06 NOTE — Progress Notes (Signed)
[name] is a 75 y.o. female with the following history as recorded in EpicCare:  Patient Active Problem List   Diagnosis Date Noted  . Dysuria 10/16/2019  . Suspected COVID-19 virus infection 10/16/2019  . Closed nondisplaced intertrochanteric fracture of right femur (HCC) 05/31/2019  . Closed displaced comminuted supracondylar fracture of left humerus without intercondylar fracture 04/17/2019  . Nonfunctioning kidney 12/25/2018  . Hydronephrosis with ureteral stricture, not elsewhere classified 12/21/2018  . Malnutrition of moderate degree 12/08/2018  . Delirium 12/07/2018  . Hydronephrosis of left kidney 12/07/2018  . Pyelonephritis 12/07/2018  . Protein-calorie malnutrition, severe 08/24/2018  . Pressure injury of skin 08/22/2018  . Obstruction of left ureteropelvic junction (UPJ) due to stone 08/21/2018  . Hypoalbuminemia 08/21/2018  . Chronic pain disorder 08/21/2018  . Thrombocytopenia (HCC) 08/21/2018  . Acute renal failure (ARF) (HCC) 08/21/2018  . Compression fracture of spine (HCC) 08/21/2018  . Acute respiratory failure with hypoxemia (HCC)   . Hematoma 06/03/2018  . Dysphagia 02/07/2018  . Generalized anxiety disorder 07/04/2017  . Nonintractable headache 07/04/2017  . Exertional dyspnea 08/05/2016  . Sepsis (HCC) 03/07/2015  . Hypokalemia 03/07/2015  . Acute encephalopathy 03/06/2015  . Fever 03/06/2015  . Chronic back pain 01/15/2015  . Obesity (BMI 30-39.9) 10/15/2013  . Pulmonary nodules 10/15/2013  . CAP (community acquired pneumonia) 10/02/2013  . Essential hypertension 08/24/2013  . Idiopathic scoliosis 04/19/2013  . Back pain 04/19/2013  . Osteoporosis 04/19/2013  . Edema 04/19/2013  . Mild diastolic dysfunction 01/01/2013  . Leg pain, bilateral 12/16/2012  . ADD (attention deficit disorder) 05/24/2012  . Involuntary movements 05/24/2012  . DIZZINESS 12/31/2009  . TREMOR, ESSENTIAL 12/12/2009  . PALPITATIONS, RECURRENT 12/12/2009  . OTHER  VITAMIN B12 DEFICIENCY ANEMIA 10/30/2009  . FEMALE STRESS INCONTINENCE 10/28/2009  . MEMORY LOSS 10/28/2009  . Morbid obesity (HCC) 12/03/2008  . GERD 12/03/2008  . ANEMIA, MILD 06/26/2008  . DEPRESSION/ANXIETY 12/28/2007  . PICA 12/28/2007  . ACUTE BRONCHITIS 12/28/2007  . FATIGUE 12/28/2007  . Hyperlipidemia 05/15/2007  . ARTIFICIAL MENOPAUSE 05/15/2007  . Myalgia and myositis, unspecified 05/15/2007  . HEADACHE 05/15/2007  . CHEST PAIN, ATYPICAL 05/15/2007  . SYMPTOM, PAIN, ABDOMINAL, EPIGASTRIC 05/15/2007  . REDUCTION MAMMOPLASTY, HX OF 05/15/2007  . PSTPRC STATUS, BARIATRIC SURGERY 05/15/2007    No current facility-administered medications for this visit.   Current Outpatient Medications  Medication Sig Dispense Refill  . acetaminophen (TYLENOL) 500 MG tablet Take 2,000 mg by mouth 3 (three) times daily as needed for moderate pain.    [name] amphetamine-dextroamphetamine (ADDERALL XR) 20 MG 24 hr capsule TAKE TWO CAPSULES BY MOUTH EACH DAY 60 capsule 0  . clonazePAM (KLONOPIN) 0.5 MG tablet TAKE 1/2 TO 1 TABLET BY MOUTH TWO TIMES DAILY AS NEEDED FOR ANXIETY 60 tablet 0  . COVID-19 mRNA vaccine, Moderna, 100 MCG/0.5ML injection INJECT AS DIRECTED .25 mL 0  . influenza vaccine adjuvanted (FLUAD) 0.5 ML injection INJECT AS DIRECTED .5 mL 0  . neomycin-bacitracin-polymyxin (NEOSPORIN) ointment Apply 1 application topically daily as needed for wound care.    [name] oxyCODONE (ROXICODONE) 15 MG immediate release tablet Take 1 tablet (15 mg total) by mouth 4 (four) times daily. (Patient taking differently: Take 15 mg by mouth 5 (five) times daily.) 120 tablet 0  . sulfamethoxazole-trimethoprim (BACTRIM DS) 800-160 MG tablet Take 1 tablet by mouth 2 (two) times daily. 14 tablet 0  . SUMAtriptan (IMITREX) 50 MG tablet TAKE 1 TABLET BY MOUTH EVCERY 2 HOURS ASNEEDED FOR MIGRAINE. MA XOF  2 DOSES IN 24 HOURS 10 tablet 1  . venlafaxine XR (EFFEXOR-XR) 75 MG 24 hr capsule TAKE 2 CAPSULES BY MOUTH DAILY  180 capsule 3   Facility-Administered Medications Ordered in Other Visits  Medication Dose Route Frequency Provider Last Rate Last Admin  . lactated ringers bolus 1,000 mL  1,000 mL Intravenous Once Sabino Donovan, MD      . ondansetron [facility]) injection 4 mg  4 mg Intravenous Once Sabino Donovan, MD      . oxyCODONE-acetaminophen (PERCOCET/ROXICET) 5-325 MG per tablet 1 tablet  1 tablet Oral Once Sabino Donovan, MD        Allergies: Zanaflex [tizanidine] and Adhesive [tape]  Past Medical History:  Diagnosis Date  . Abrasion of right heel    during admission 10/ 2019 right heel open skin due to movement on sheet  . ADD (attention deficit disorder)   . Anemia, mild   . Anxiety   . Arthritis    "joints, back"  . At high risk for falls    10-19-2018 pt has fell twice in past week, tripped  . Chronic back pain    "all over my back" (10/02/2013)  . Crushing injury of arm, right 02/06/1989's   "it was crushed; wore cast from fingers to top of my shoulder" (11/25/2  . Crushing injury of left wrist 05/11/1989's  . DOE (dyspnea on exertion)   . Essential tremor   . Fibromyalgia   . History of palpitations 2002   recurrent  . History of panic attacks   . History of pneumonia 08/21/2018   w/ acute respiratory failure/ hypoxia  . History of sepsis    admission 08-21-2018  secondary to uropathy obstructive from ureteral stone   . Hyperlipidemia   . Hypertension    10-19-2018 PER PT AMLODIPINE ON HOLD SINCE 10/ 2019 DUE TO KIDNEY ISSUES PER DOCTOR  . Idiopathic scoliosis 04/19/2013  . Left ureteral stone   . Migraines    "once/month" (10/02/2013)  . Osteoporosis   . Other vitamin B12 deficiency anemia   . PONV (postoperative nausea and vomiting)    As a child  . Pulmonary nodules/lesions, multiple   . Renal insufficiency   . S/P gastric bypass 09/16/2003  . S/P insertion of spinal cord stimulator    per pt remote is missing  . Wears dentures    upper  . Wears glasses   . Wears glasses      Past Surgical History:  Procedure Laterality Date  . ABDOMINAL HYSTERECTOMY  1980's   W/  BSO  AND APPENDECTOMY  . CARDIAC CATHETERIZATION  12-13-2002    dr Eden Emms   normal coronaries  . CYSTOSCOPY WITH URETEROSCOPY AND STENT PLACEMENT Left 09/27/2018   Procedure: LEFT  URETEROSCOPY, HOLMIUM LASER LITHOTRIPSY;  Surgeon: Crist Fat, MD;  Location: [facility];  Service: Urology;  Laterality: Left;  . D & C HYSTERSCOPY RESECTION MYOMECTOMY  1980s  . HUMERUS FRACTURE SURGERY Left 04/17/2019    Comminuted complex left intra-articular distal humerus fracture/supracondylar humerus fracture displaced  . INTRAMEDULLARY (IM) NAIL INTERTROCHANTERIC Right 06/01/2019   Procedure: INTRAMEDULLARY (IM) NAIL INTERTROCHANTRIC;  Surgeon: Ollen Gross, MD;  Location: [facility];  Service: Orthopedics;  Laterality: Right;  . IR NEPHROSTOMY PLACEMENT LEFT  08/22/2018  . IR NEPHROSTOMY PLACEMENT LEFT  12/08/2018  . KNEE ARTHROSCOPY Right 03-31-2001    dr Simonne Come @[facility]   . NEPHROLITHOTOMY Left 10/20/2018   Procedure: LEFT NEPHROLITHOTOMY PERCUTANEOUS;  Surgeon: 10/22/2018, MD;  Location:  [facility];  Service: Urology;  Laterality: Left;  . ORIF HUMERUS FRACTURE Left 04/17/2019   Procedure: Open reduction internal fixation left supracondylar humerus fracture with olecranon osteotomy and ulnar nerve release and anterior transposition and repair of structures as necessary;  Surgeon: Dominica Severin, MD;  Location: [facility];  Service: Orthopedics;  Laterality: Left;  3 hrs  . REDUCTION MAMMAPLASTY Bilateral 2001  . ROBOT ASSISTED LAPAROSCOPIC NEPHRECTOMY Left 12/25/2018   Procedure: XI ROBOTIC ASSISTED LAPAROSCOPIC NEPHRECTOMY, LEFT FLANK EXPLORATION WITH REMOVAL OF BATTERY PACK;  Surgeon: Crist Fat, MD;  Location: [facility];  Service: Urology;  Laterality: Left;  . ROUX-EN-Y GASTRIC BYPASS  09-16-2003   dr Judie Petit. Daphine Deutscher  @[facility]    via Laparoscopy w/ gastrojejunostomy  . SPINAL CORD STIMULATOR IMPLANT  2016    10-19-2018  PER PT HAS REMOTE BUT HAS NOT USED IT SINCE DEC 2018  . TONSILLECTOMY AND ADENOIDECTOMY  1955  . TUBAL LIGATION  1980's    Family History  Adopted: Yes  Problem Relation Age of Onset  . COPD Mother   . Emphysema Mother   . Heart attack Mother   . Other Mother        tobacco abuse    Social History   Tobacco Use  . Smoking status: Never Smoker  . Smokeless tobacco: Never Used  Substance Use Topics  . Alcohol use: No    Subjective:   Patient is brought to the office by her husband; complaining of LLQ x 5 days- feels like the pain is worsening; complaining of diarrhea/ nausea and some vomiting; notes that she feels the pain is progressively worsening and actually considered going to ER earlier today;  No known fever; no documented history of diverticulitis but last colonoscopy documented in 2004; Does note that her stool has been "very dark" but she did take Pepto Bismol.    Objective:  Vitals:   03/06/21 1531  BP: (!) 90/50  Pulse: 64  Temp: 97.6 F (36.4 C)  TempSrc: Oral  SpO2: 98%    General: Well developed, well nourished, in no acute distress; frail appearing Skin : Warm and dry.  Head: Normocephalic and atraumatic  Lungs: Respirations unlabored;  Abdomen: Soft; nontender; nondistended; tender to touch LLQ Neurologic: Alert and oriented; speech intact; face symmetrical; moves all extremities well; CNII-XII intact without focal deficit   Assessment:  1. LLQ pain     Plan:  Based on patient's appearance and appointment scheduled at 4 pm on Friday, feel that she needs to go ER for further evaluation- concern for diverticulitis/ abscess;  Follow-up with her PCP after being seen in the ER;  This visit occurred during the SARS-CoV-2 public health emergency.  Safety protocols were in place, including screening questions prior to the visit, additional usage of staff PPE, and extensive cleaning of exam room while observing appropriate contact time as  indicated for disinfecting solutions.     No follow-ups on file.  No orders of the defined types were placed in this encounter.   Requested Prescriptions    No prescriptions requested or ordered in this encounter

## 2021-03-06 NOTE — ED Notes (Signed)
Taken to CT.

## 2021-03-08 ENCOUNTER — Emergency Department (HOSPITAL_BASED_OUTPATIENT_CLINIC_OR_DEPARTMENT_OTHER): Admission: EM | Admit: 2021-03-08 | Discharge: 2021-03-08 | Discharge status: Absent without leave | Payer: Medicare PPO

## 2021-03-08 ENCOUNTER — Other Ambulatory Visit: Payer: Self-pay

## 2021-03-09 ENCOUNTER — Telehealth: Payer: Self-pay | Admitting: Family Medicine

## 2021-03-09 ENCOUNTER — Other Ambulatory Visit: Payer: Self-pay | Admitting: Family Medicine

## 2021-03-09 DIAGNOSIS — M4120 Other idiopathic scoliosis, site unspecified: Secondary | ICD-10-CM | POA: Diagnosis not present

## 2021-03-09 DIAGNOSIS — G894 Chronic pain syndrome: Secondary | ICD-10-CM | POA: Diagnosis not present

## 2021-03-09 DIAGNOSIS — M40209 Unspecified kyphosis, site unspecified: Secondary | ICD-10-CM | POA: Diagnosis not present

## 2021-03-09 DIAGNOSIS — R197 Diarrhea, unspecified: Secondary | ICD-10-CM

## 2021-03-09 DIAGNOSIS — M4722 Other spondylosis with radiculopathy, cervical region: Secondary | ICD-10-CM | POA: Diagnosis not present

## 2021-03-09 DIAGNOSIS — R112 Nausea with vomiting, unspecified: Secondary | ICD-10-CM

## 2021-03-09 NOTE — Telephone Encounter (Signed)
Patient states she need labs placed to re check with kidney function, patient sent to ED last Friday and they want her to have labs recheck today in our office.  Are you willing to check w/o seeing the pt first?

## 2021-03-09 NOTE — Telephone Encounter (Signed)
Ok -- lab order is in

## 2021-03-09 NOTE — Telephone Encounter (Signed)
Patient seen in the ED last Friday for abd pain, per ED patient needs to come back in to PCP to have labs repeated to ensure her kidneys are functioning properly.

## 2021-03-09 NOTE — Telephone Encounter (Signed)
Pt sees Dr Berniece Salines. She has an appointment with them on Tuesday. Appointment scheduled for Thursday with you.

## 2021-03-09 NOTE — Telephone Encounter (Signed)
Ok to have lab app-- but also need er f/u this week

## 2021-03-11 ENCOUNTER — Other Ambulatory Visit: Payer: Medicare PPO

## 2021-03-12 ENCOUNTER — Ambulatory Visit: Payer: Medicare PPO | Admitting: Family Medicine

## 2021-03-12 NOTE — Progress Notes (Incomplete)
Subjective:   By signing my name below, I, Shehryar Baig, attest that this documentation has been prepared under the direction and in the presence of Dr. [name], DO. 03/12/2021      Patient ID: [name], female    DOB: [birth year], 75 y.o.   MRN: [id number]  No chief complaint on file.   HPI Patient is in today for a office visit. She is following up after visiting the ER  She denies having any fever, ear pain, congestion, sinus pain, sore throat, eye pain, chest pain, palpations, cough, shortness of breath, wheezing, nausea, vomiting, diarrhea, constipation, blood in stool, dysuria, frequency, hematuria, dizziness, or headaches at this time.    Past Medical History:  Diagnosis Date  . Abrasion of right heel    during admission 10/ 2019 right heel open skin due to movement on sheet  . ADD (attention deficit disorder)   . Anemia, mild   . Anxiety   . Arthritis    "joints, back"  . At high risk for falls    10-19-2018 pt has fell twice in past week, tripped  . Chronic back pain    "all over my back" (10/02/2013)  . Crushing injury of arm, right 02/06/1989's   "it was crushed; wore cast from fingers to top of my shoulder" (11/25/2  . Crushing injury of left wrist 05/11/1989's  . DOE (dyspnea on exertion)   . Essential tremor   . Fibromyalgia   . History of palpitations 2002   recurrent  . History of panic attacks   . History of pneumonia 08/21/2018   w/ acute respiratory failure/ hypoxia  . History of sepsis    admission 08-21-2018  secondary to uropathy obstructive from ureteral stone   . Hyperlipidemia   . Hypertension    10-19-2018 PER PT AMLODIPINE ON HOLD SINCE 10/ 2019 DUE TO KIDNEY ISSUES PER DOCTOR  . Idiopathic scoliosis 04/19/2013  . Left ureteral stone   . Migraines    "once/month" (10/02/2013)  . Osteoporosis   . Other vitamin B12 deficiency anemia   . PONV (postoperative nausea and vomiting)    As a child  . Pulmonary nodules/lesions,  multiple   . Renal insufficiency   . S/P gastric bypass 09/16/2003  . S/P insertion of spinal cord stimulator    per pt remote is missing  . Wears dentures    upper  . Wears glasses   . Wears glasses     Past Surgical History:  Procedure Laterality Date  . ABDOMINAL HYSTERECTOMY  1980's   W/  BSO  AND APPENDECTOMY  . CARDIAC CATHETERIZATION  12-13-2002    dr Eden Emms   normal coronaries  . CYSTOSCOPY WITH URETEROSCOPY AND STENT PLACEMENT Left 09/27/2018   Procedure: LEFT  URETEROSCOPY, HOLMIUM LASER LITHOTRIPSY;  Surgeon: Crist Fat, MD;  [facility]: [facility];  Service: Urology;  Laterality: Left;  . D & C HYSTERSCOPY RESECTION MYOMECTOMY  1980s  . HUMERUS FRACTURE SURGERY Left 04/17/2019    Comminuted complex left intra-articular distal humerus fracture/supracondylar humerus fracture displaced  . INTRAMEDULLARY (IM) NAIL INTERTROCHANTERIC Right 06/01/2019   Procedure: INTRAMEDULLARY (IM) NAIL INTERTROCHANTRIC;  Surgeon: Ollen Gross, MD;  [facility];  Service: Orthopedics;  Laterality: Right;  . IR NEPHROSTOMY PLACEMENT LEFT  08/22/2018  . IR NEPHROSTOMY PLACEMENT LEFT  12/08/2018  . KNEE ARTHROSCOPY Right 03-31-2001    dr Simonne Come @[facility]   . NEPHROLITHOTOMY Left 10/20/2018   Procedure: LEFT NEPHROLITHOTOMY PERCUTANEOUS;  Surgeon: 10/22/2018, MD;  Location: [facility];  Service: Urology;  Laterality: Left;  . ORIF HUMERUS FRACTURE Left 04/17/2019   Procedure: Open reduction internal fixation left supracondylar humerus fracture with olecranon osteotomy and ulnar nerve release and anterior transposition and repair of structures as necessary;  Surgeon: Dominica Severin, MD;  Location: [facility];  Service: Orthopedics;  Laterality: Left;  3 hrs  . REDUCTION MAMMAPLASTY Bilateral 2001  . ROBOT ASSISTED LAPAROSCOPIC NEPHRECTOMY Left 12/25/2018   Procedure: XI ROBOTIC ASSISTED LAPAROSCOPIC NEPHRECTOMY, LEFT FLANK EXPLORATION WITH REMOVAL OF BATTERY PACK;  Surgeon: Crist Fat,  MD;  [facility];  Service: Urology;  Laterality: Left;  . ROUX-EN-Y GASTRIC BYPASS  09-16-2003   dr Judie Petit. Daphine Deutscher  @[facility]    via Laparoscopy w/ gastrojejunostomy  . SPINAL CORD STIMULATOR IMPLANT  2016   10-19-2018  PER PT HAS REMOTE BUT HAS NOT USED IT SINCE DEC 2018  . TONSILLECTOMY AND ADENOIDECTOMY  1955  . TUBAL LIGATION  1980's    Family History  Adopted: Yes  Problem Relation Age of Onset  . COPD Mother   . Emphysema Mother   . Heart attack Mother   . Other Mother        tobacco abuse    Social History   Socioeconomic History  . Marital status: Married    Spouse name: Not on file  . Number of children: 2  . Years of education: Not on file  . Highest education level: Not on file  Occupational History  . Occupation: Retired  Tobacco Use  . Smoking status: Never Smoker  . Smokeless tobacco: Never Used  Vaping Use  . Vaping Use: Never used  Substance and Sexual Activity  . Alcohol use: No  . Drug use: No  . Sexual activity: Never    Partners: Male  Other Topics Concern  . Not on file  Social History Narrative  . Not on file   Social Determinants of Health   Financial Resource Strain: Not on file  Food Insecurity: Not on file  Transportation Needs: Not on file  Physical Activity: Not on file  Stress: Not on file  Social Connections: Not on file  Intimate Partner Violence: Not on file    Outpatient Medications Prior to Visit  Medication Sig Dispense Refill  . acetaminophen (TYLENOL) 500 MG tablet Take 2,000 mg by mouth 3 (three) times daily as needed for moderate pain.    Jan 2019 amphetamine-dextroamphetamine (ADDERALL XR) 20 MG 24 hr capsule TAKE TWO CAPSULES BY MOUTH EACH DAY 60 capsule 0  . clonazePAM (KLONOPIN) 0.5 MG tablet TAKE 1/2 TO 1 TABLET BY MOUTH TWO TIMES DAILY AS NEEDED FOR ANXIETY 60 tablet 0  . COVID-19 mRNA vaccine, Moderna, 100 MCG/0.5ML injection INJECT AS DIRECTED .25 mL 0  . influenza vaccine adjuvanted (FLUAD) 0.5 ML injection INJECT AS  DIRECTED .5 mL 0  . neomycin-bacitracin-polymyxin (NEOSPORIN) ointment Apply 1 application topically daily as needed for wound care.    . ondansetron (ZOFRAN ODT) 4 MG disintegrating tablet Take 1 tablet (4 mg total) by mouth every 8 (eight) hours as needed for up to 10 doses for nausea or vomiting. 10 tablet 0  . oxyCODONE (ROXICODONE) 15 MG immediate release tablet Take 1 tablet (15 mg total) by mouth 4 (four) times daily. (Patient taking differently: Take 15 mg by mouth 5 (five) times daily.) 120 tablet 0  . sulfamethoxazole-trimethoprim (BACTRIM DS) 800-160 MG tablet Take 1 tablet by mouth 2 (two) times daily. 14 tablet 0  . SUMAtriptan (IMITREX) 50  MG tablet TAKE 1 TABLET BY MOUTH EVCERY 2 HOURS ASNEEDED FOR MIGRAINE. MA XOF 2 DOSES IN 24 HOURS 10 tablet 1  . venlafaxine XR (EFFEXOR-XR) 75 MG 24 hr capsule TAKE 2 CAPSULES BY MOUTH DAILY 180 capsule 3   No facility-administered medications prior to visit.    Allergies  Allergen Reactions  . Zanaflex [Tizanidine]     hallucinations  . Adhesive [Tape] Itching    Review of Systems  Constitutional: Negative for fever.  HENT: Negative for congestion, ear pain, sinus pain and sore throat.   Eyes: Negative for pain.  Respiratory: Negative for cough, shortness of breath and wheezing.   Cardiovascular: Negative for chest pain and palpitations.  Gastrointestinal: Negative for blood in stool, constipation, diarrhea, nausea and vomiting.  Genitourinary: Negative for dysuria, frequency and hematuria.  Neurological: Negative for dizziness and headaches.       Objective:    Physical Exam Constitutional:      Appearance: Normal appearance.  HENT:     Head: Normocephalic and atraumatic.     Right Ear: External ear normal.     Left Ear: External ear normal.  Eyes:     Extraocular Movements: Extraocular movements intact.     Pupils: Pupils are equal, round, and reactive to light.  Cardiovascular:     Rate and Rhythm: Normal rate and  regular rhythm.     Heart sounds: Normal heart sounds. No murmur heard.   Pulmonary:     Effort: Pulmonary effort is normal. No respiratory distress.     Breath sounds: Normal breath sounds. No wheezing, rhonchi or rales.  Skin:    General: Skin is warm and dry.  Neurological:     Mental Status: She is alert and oriented to person, place, and time.  Psychiatric:        Behavior: Behavior normal.     There were no vitals taken for this visit. Wt Readings from Last 3 Encounters:  03/06/21 113 lb 15.7 oz (51.7 kg)  08/06/20 114 lb (51.7 kg)  06/01/19 125 lb (56.7 kg)    Diabetic Foot Exam - Simple   No data filed    Lab Results  Component Value Date   WBC 6.8 03/06/2021   HGB 15.3 (H) 03/06/2021   HCT 46.6 (H) 03/06/2021   PLT 205 03/06/2021   GLUCOSE 102 (H) 03/06/2021   CHOL 160 09/22/2020   TRIG 97.0 09/22/2020   HDL 77.60 09/22/2020   LDLDIRECT 129.1 07/17/2007   LDLCALC 63 09/22/2020   ALT 11 03/06/2021   AST 20 03/06/2021   NA 133 (L) 03/06/2021   K 3.0 (L) 03/06/2021   CL 98 03/06/2021   CREATININE 1.73 (H) 03/06/2021   BUN 28 (H) 03/06/2021   CO2 23 03/06/2021   TSH 1.925 08/30/2018   INR 1.0 05/31/2019   HGBA1C 5.3 04/22/2014    Lab Results  Component Value Date   TSH 1.925 08/30/2018   Lab Results  Component Value Date   WBC 6.8 03/06/2021   HGB 15.3 (H) 03/06/2021   HCT 46.6 (H) 03/06/2021   MCV 97.3 03/06/2021   PLT 205 03/06/2021   Lab Results  Component Value Date   NA 133 (L) 03/06/2021   K 3.0 (L) 03/06/2021   CO2 23 03/06/2021   GLUCOSE 102 (H) 03/06/2021   BUN 28 (H) 03/06/2021   CREATININE 1.73 (H) 03/06/2021   BILITOT 0.6 03/06/2021   ALKPHOS 122 03/06/2021   AST 20 03/06/2021   ALT 11 03/06/2021  PROT 8.0 03/06/2021   ALBUMIN 4.4 03/06/2021   CALCIUM 9.4 03/06/2021   ANIONGAP 12 03/06/2021   GFR 50.70 (L) 09/22/2020   Lab Results  Component Value Date   CHOL 160 09/22/2020   Lab Results  Component Value Date    HDL 77.60 09/22/2020   Lab Results  Component Value Date   LDLCALC 63 09/22/2020   Lab Results  Component Value Date   TRIG 97.0 09/22/2020   Lab Results  Component Value Date   CHOLHDL 2 09/22/2020   Lab Results  Component Value Date   HGBA1C 5.3 04/22/2014       Assessment & Plan:   Problem List Items Addressed This Visit   None      No orders of the defined types were placed in this encounter.   I, Shehryar Tyson AliasBaig, personally preformed the services described in this documentation.  All medical record entries made by the scribe were at my direction and in my presence.  I have reviewed the chart and discharge instructions (if applicable) and agree that the record reflects my personal performance and is accurate and complete. 03/12/2021   I,Shehryar Baig,acting as a scribe for Donato SchultzYvonne R Lowne Chase, DO.,have documented all relevant documentation on the behalf of Donato SchultzYvonne R Lowne Chase, DO,as directed by  Donato SchultzYvonne R Lowne Chase, DO while in the presence of Donato SchultzYvonne R Lowne Chase, DO.   Shehryar [employer] Block[employer]ig

## 2021-03-17 ENCOUNTER — Other Ambulatory Visit: Payer: Self-pay | Admitting: Family Medicine

## 2021-03-17 ENCOUNTER — Telehealth: Payer: Self-pay | Admitting: Family Medicine

## 2021-03-17 ENCOUNTER — Inpatient Hospital Stay: Payer: Medicare PPO | Admitting: Family Medicine

## 2021-03-17 DIAGNOSIS — R519 Headache, unspecified: Secondary | ICD-10-CM

## 2021-03-17 DIAGNOSIS — F988 Other specified behavioral and emotional disorders with onset usually occurring in childhood and adolescence: Secondary | ICD-10-CM

## 2021-03-17 MED ORDER — SUMATRIPTAN SUCCINATE 50 MG PO TABS: ORAL_TABLET | ORAL | 1 refills | Status: DC

## 2021-03-17 NOTE — Telephone Encounter (Signed)
Requesting: Adderall XR Contract: 2019  UDS: 2019 Last OV: 03/06/21 w/ Vernona Rieger Next OV: 03/23/21 Last Refill: 02/13/2021, #60--0 RF Database:   Please advise

## 2021-03-17 NOTE — Telephone Encounter (Signed)
Refill sent and refill request sent to [facility]

## 2021-03-17 NOTE — Telephone Encounter (Signed)
Medication: SUMAtriptan (IMITREX) 50 MG tablet [[id number]]    amphetamine-dextroamphetamine (ADDERALL XR) 20 MG 24 hr capsule [[id number]]      Has the patient contacted their pharmacy?  (If no, request that the patient contact the pharmacy for the refill.) (If yes, when and what did the pharmacy advise?)     Preferred Pharmacy (with phone number or street name): [facility] - [place], [place] - [address]  [address], [place] [address]  Phone:  [phone] Fax:  [phone]      Agent: Please be advised that RX refills may take up to 3 business days. We ask that you follow-up with your pharmacy.

## 2021-03-23 ENCOUNTER — Inpatient Hospital Stay: Payer: Medicare PPO | Admitting: Family Medicine

## 2021-03-27 ENCOUNTER — Other Ambulatory Visit: Payer: Self-pay | Admitting: Family Medicine

## 2021-03-27 DIAGNOSIS — F419 Anxiety disorder, unspecified: Secondary | ICD-10-CM

## 2021-03-27 NOTE — Telephone Encounter (Signed)
Requesting: Klonopin Contract: 2019 UDS: 2019 Last OV: 03/06/21 w/ Vernona Rieger Next OV: N/A Last Refill: 02/23/2021, #60--0RF Database:   Please advise

## 2021-03-30 ENCOUNTER — Telehealth: Payer: Self-pay | Admitting: Family Medicine

## 2021-03-30 NOTE — Telephone Encounter (Signed)
Caller: Lurena Joiner [facility] Call back [phone]  Patient case ID # [id number]  Wants to know if you received opiate forms (faxed multiple times

## 2021-03-31 NOTE — Telephone Encounter (Signed)
Form that needs to be completed has been received and placed in the sign folder.

## 2021-03-31 NOTE — Telephone Encounter (Signed)
Spoke with [employer], they will refax the form- the form Is just to verify that the current opioid is her correct dosage.

## 2021-04-02 NOTE — Telephone Encounter (Signed)
Pt can not be on klonopin and opiates---  she is getting pain meds somewhere else---  is she still taking it?

## 2021-04-02 NOTE — Telephone Encounter (Signed)
Caller:Ashley ([employer]) Call back # [phone] Patient case [id number]   Need form by end of next week.

## 2021-04-03 ENCOUNTER — Other Ambulatory Visit: Payer: Self-pay

## 2021-04-03 NOTE — Telephone Encounter (Signed)
Called and went over medication list with pt. She is taking the Oxy still. This is prescribed by Dr Noel Gerold- Spine/pain dr (care everywhere)

## 2021-04-03 NOTE — Telephone Encounter (Signed)
Form has been faxed back to [facility].

## 2021-04-03 NOTE — Telephone Encounter (Signed)
I filled it out --- its in the folder on either yours or heathers desk

## 2021-04-08 DIAGNOSIS — N3000 Acute cystitis without hematuria: Secondary | ICD-10-CM | POA: Diagnosis not present

## 2021-04-09 DIAGNOSIS — M40209 Unspecified kyphosis, site unspecified: Secondary | ICD-10-CM | POA: Diagnosis not present

## 2021-04-09 DIAGNOSIS — M4120 Other idiopathic scoliosis, site unspecified: Secondary | ICD-10-CM | POA: Diagnosis not present

## 2021-04-09 DIAGNOSIS — M4722 Other spondylosis with radiculopathy, cervical region: Secondary | ICD-10-CM | POA: Diagnosis not present

## 2021-04-09 DIAGNOSIS — G894 Chronic pain syndrome: Secondary | ICD-10-CM | POA: Diagnosis not present

## 2021-04-17 ENCOUNTER — Other Ambulatory Visit: Payer: Self-pay | Admitting: Family Medicine

## 2021-04-17 DIAGNOSIS — F988 Other specified behavioral and emotional disorders with onset usually occurring in childhood and adolescence: Secondary | ICD-10-CM

## 2021-04-17 NOTE — Telephone Encounter (Signed)
Requesting: Adderall XR 20mg  Contract: 02/07/2018 UDS: 10/24/2018 Last Visit: 08/06/2020 Next Visit: None Last Refill: 03/18/2021 #60 and 0RF  Please Advise

## 2021-04-21 ENCOUNTER — Other Ambulatory Visit: Payer: Self-pay | Admitting: Family Medicine

## 2021-04-21 DIAGNOSIS — F419 Anxiety disorder, unspecified: Secondary | ICD-10-CM

## 2021-04-21 NOTE — Telephone Encounter (Signed)
Medication: clonazePAM (KLONOPIN) 0.5 MG tablet [[id number]]      Has the patient contacted their pharmacy? No. (If no, request that the patient contact the pharmacy for the refill.) (If yes, when and what did the pharmacy advise?)     Preferred Pharmacy (with phone number or street name): [facility] - [place], [place] - [address]  [address], [place] [address]  Phone:  [phone]  Fax:  [phone]      Agent: Please be advised that RX refills may take up to 3 business days. We ask that you follow-up with your pharmacy.

## 2021-04-22 MED ORDER — CLONAZEPAM 0.5 MG PO TABS: ORAL_TABLET | ORAL | 0 refills | Status: DC

## 2021-04-22 NOTE — Telephone Encounter (Signed)
Requesting: Klonopin Contract: 2019 UDS: 2019 Last OV: 03/06/21 w/Laura Next OV: N/A Last Refill: 03/27/21, #60--0 RF Database:   Please advise

## 2021-05-07 DIAGNOSIS — M40209 Unspecified kyphosis, site unspecified: Secondary | ICD-10-CM | POA: Diagnosis not present

## 2021-05-07 DIAGNOSIS — M4722 Other spondylosis with radiculopathy, cervical region: Secondary | ICD-10-CM | POA: Diagnosis not present

## 2021-05-07 DIAGNOSIS — G894 Chronic pain syndrome: Secondary | ICD-10-CM | POA: Diagnosis not present

## 2021-05-07 DIAGNOSIS — M4120 Other idiopathic scoliosis, site unspecified: Secondary | ICD-10-CM | POA: Diagnosis not present

## 2021-05-15 ENCOUNTER — Other Ambulatory Visit: Payer: Self-pay | Admitting: Family Medicine

## 2021-05-15 DIAGNOSIS — F988 Other specified behavioral and emotional disorders with onset usually occurring in childhood and adolescence: Secondary | ICD-10-CM

## 2021-05-15 DIAGNOSIS — F411 Generalized anxiety disorder: Secondary | ICD-10-CM

## 2021-05-15 NOTE — Telephone Encounter (Signed)
Requesting: Adderall XR 20mg   Contract: 02/07/2018 UDS: 10/24/2018 Last Visit: 03/06/2021 Next Visit: None Last Refill: 04/17/2021 #60 and 0RF  Please Advise

## 2021-05-15 NOTE — Telephone Encounter (Signed)
Medication: venlafaxine XR (EFFEXOR-XR) 75 MG 24   amphetamine-dextroamphetamine (ADDERALL XR) 20 MG 24 hr capsule   Has the patient contacted their pharmacy? No. (If no, request that the patient contact the pharmacy for the refill.) (If yes, when and what did the pharmacy advise?)  Preferred Pharmacy (with phone number or street name): [facility] - [place], [place] - [address]  [address], [place] [address]  Phone:  [phone]  Fax:  [phone]   Agent: Please be advised that RX refills may take up to 3 business days. We ask that you follow-up with your pharmacy.

## 2021-05-15 NOTE — Telephone Encounter (Signed)
Requesting: Adderall XR Contract: 2019 UDS: 2019 Last OV: 03/06/21 w/ Vernona Rieger Next OV: N/A Last Refill: 05/15/2021, #60--0 RF Database:   Please advise

## 2021-05-18 ENCOUNTER — Other Ambulatory Visit: Payer: Self-pay | Admitting: Family Medicine

## 2021-05-18 DIAGNOSIS — F988 Other specified behavioral and emotional disorders with onset usually occurring in childhood and adolescence: Secondary | ICD-10-CM

## 2021-05-18 MED ORDER — AMPHETAMINE-DEXTROAMPHET ER 20 MG PO CP24: ORAL_CAPSULE | ORAL | 0 refills | Status: DC

## 2021-05-18 NOTE — Telephone Encounter (Signed)
Pt called stating [facility] is out of Adderall 20mg  and she needs the script sent to on [address].

## 2021-05-18 NOTE — Telephone Encounter (Signed)
Pt states that she contacted her pharmacy for rx for her adderall. States she is needing a new rx sent to pharmacy [facility]

## 2021-05-18 NOTE — Telephone Encounter (Signed)
PT called. Advised refill was sent on 05/15/21

## 2021-05-22 ENCOUNTER — Other Ambulatory Visit: Payer: Self-pay | Admitting: Family Medicine

## 2021-05-22 DIAGNOSIS — F419 Anxiety disorder, unspecified: Secondary | ICD-10-CM

## 2021-05-23 ENCOUNTER — Other Ambulatory Visit: Payer: Self-pay | Admitting: Family Medicine

## 2021-05-23 DIAGNOSIS — F419 Anxiety disorder, unspecified: Secondary | ICD-10-CM

## 2021-05-25 NOTE — Telephone Encounter (Signed)
Requesting: Klonopin  Contract: 10/24/18 UDS: 10/24/18 Last OV: 03/06/21 Next OV: N/A Last Refill: 04/22/21, #60--0 RF Database:   Please advise

## 2021-06-04 DIAGNOSIS — M40209 Unspecified kyphosis, site unspecified: Secondary | ICD-10-CM | POA: Diagnosis not present

## 2021-06-04 DIAGNOSIS — M4722 Other spondylosis with radiculopathy, cervical region: Secondary | ICD-10-CM | POA: Diagnosis not present

## 2021-06-04 DIAGNOSIS — G894 Chronic pain syndrome: Secondary | ICD-10-CM | POA: Diagnosis not present

## 2021-06-04 DIAGNOSIS — M4120 Other idiopathic scoliosis, site unspecified: Secondary | ICD-10-CM | POA: Diagnosis not present

## 2021-06-08 ENCOUNTER — Other Ambulatory Visit: Payer: Self-pay | Admitting: Family Medicine

## 2021-06-08 DIAGNOSIS — R519 Headache, unspecified: Secondary | ICD-10-CM

## 2021-07-07 DIAGNOSIS — G894 Chronic pain syndrome: Secondary | ICD-10-CM | POA: Diagnosis not present

## 2021-07-31 ENCOUNTER — Telehealth: Payer: Self-pay | Admitting: Family Medicine

## 2021-07-31 NOTE — Telephone Encounter (Signed)
Requesting: Adderall XR Contract: 2019 UDS: 2019 Last OV: 0429/22 Next OV: N/A Last Refill: 05/18/21, #60--0 RF Database:   Please advise

## 2021-07-31 NOTE — Telephone Encounter (Signed)
Medication:  amphetamine-dextroamphetamine (ADDERALL XR) 20 MG 24 hr capsule  Has the patient contacted their pharmacy? No. (If no, request that the patient contact the pharmacy for the refill.) (If yes, when and what did the pharmacy advise?)  Preferred Pharmacy (with phone number or street name): [id number] - [place], [place] - [address]  [address], [place] [address]  Phone:  [phone]    Agent: Please be advised that RX refills may take up to 3 business days. We ask that you follow-up with your pharmacy.

## 2021-08-03 ENCOUNTER — Other Ambulatory Visit: Payer: Self-pay | Admitting: Family Medicine

## 2021-08-03 DIAGNOSIS — F419 Anxiety disorder, unspecified: Secondary | ICD-10-CM

## 2021-08-03 DIAGNOSIS — F988 Other specified behavioral and emotional disorders with onset usually occurring in childhood and adolescence: Secondary | ICD-10-CM

## 2021-08-03 MED ORDER — AMPHETAMINE-DEXTROAMPHET ER 20 MG PO CP24
ORAL_CAPSULE | ORAL | 0 refills | Status: DC
Start: 2021-08-03 — End: 2021-08-19

## 2021-08-03 MED ORDER — CLONAZEPAM 0.5 MG PO TABS: ORAL_TABLET | ORAL | 1 refills | Status: DC

## 2021-08-03 NOTE — Telephone Encounter (Signed)
Medication: clonazePAM (KLONOPIN) 0.5 MG tablet     Has the patient contacted their pharmacy? No. (If no, request that the patient contact the pharmacy for the refill.) (If yes, when and what did the pharmacy advise?)    Preferred Pharmacy (with phone number or street name): [facility] - [place], [place] - [address]  [address], [place] [address]  Phone:  [phone]  Fax:  [phone]

## 2021-08-03 NOTE — Telephone Encounter (Signed)
Requesting: klonopin Contract: 2018 UDS: 2018 Last OV: 03/06/21 w/ Vernona Rieger Next OV: N/A Last Refill: 05/25/21, #60--1 RF Database:   Please advise

## 2021-08-03 NOTE — Telephone Encounter (Signed)
Patient is calling to follow up refill request.  Who Is Calling Patient / Member / Family / Caregiver Call Type Triage / Clinical Relationship To Patient Self Return Phone Number [phone] (Primary) Chief Complaint Prescription Refill or Medication Request (non symptomatic) Reason for Call Medication Question / Request Initial Comment Caller states she needs a refill on her Adderall, she states she called in earlier and the doctor told her he will be calling it in for her today. Translation No Nurse Assessment Nurse: Vear Clock, RN, Elease Hashimoto Date/Time Lamount Cohen Time): 07/31/2021 6:06:33 PM Confirm and document reason for call. If symptomatic, describe symptoms. ---She needs a refill on her Adderall, she states she called in earlier and the doctor told her he will be calling it in for her today. She feels like she is having withdrawal. She is jittery, she cannot get her thoughts together. she feels scattered and has aheadache

## 2021-08-11 DIAGNOSIS — G894 Chronic pain syndrome: Secondary | ICD-10-CM | POA: Diagnosis not present

## 2021-08-11 DIAGNOSIS — M4722 Other spondylosis with radiculopathy, cervical region: Secondary | ICD-10-CM | POA: Diagnosis not present

## 2021-08-11 DIAGNOSIS — M4120 Other idiopathic scoliosis, site unspecified: Secondary | ICD-10-CM | POA: Diagnosis not present

## 2021-08-11 DIAGNOSIS — M40209 Unspecified kyphosis, site unspecified: Secondary | ICD-10-CM | POA: Diagnosis not present

## 2021-08-14 ENCOUNTER — Telehealth: Payer: Self-pay | Admitting: Family Medicine

## 2021-08-14 DIAGNOSIS — F988 Other specified behavioral and emotional disorders with onset usually occurring in childhood and adolescence: Secondary | ICD-10-CM

## 2021-08-14 NOTE — Telephone Encounter (Signed)
Patient states she spilled her Orange juice on her Adderall pills and she was only able to save 7 of them. She is wondering if [facility] can order her some to hold her over until 10/26 when she gets her next prescription. Please advice.

## 2021-08-15 ENCOUNTER — Other Ambulatory Visit: Payer: Self-pay | Admitting: Family Medicine

## 2021-08-15 DIAGNOSIS — R519 Headache, unspecified: Secondary | ICD-10-CM

## 2021-08-18 NOTE — Telephone Encounter (Signed)
Spoke with pt. Pt states if we could send enough to last until the 26th. Pt made aware she will have to pay out of pocket.

## 2021-08-19 ENCOUNTER — Other Ambulatory Visit: Payer: Self-pay | Admitting: Family Medicine

## 2021-08-19 DIAGNOSIS — F988 Other specified behavioral and emotional disorders with onset usually occurring in childhood and adolescence: Secondary | ICD-10-CM

## 2021-08-19 MED ORDER — AMPHETAMINE-DEXTROAMPHET ER 20 MG PO CP24: ORAL_CAPSULE | ORAL | 0 refills | Status: DC

## 2021-08-19 NOTE — Telephone Encounter (Signed)
Patient states that the [facility] cannot refill her adderall. She would like to know is the prescription to the [facility] [address]. Patient is aware she will have to pay out of pocket.

## 2021-08-19 NOTE — Telephone Encounter (Signed)
Spoke with [facility] and gave verbal for temporary Rx to be filled. Spoke with the pt and advised that medication will be filled at [facility].

## 2021-08-24 ENCOUNTER — Telehealth: Payer: Self-pay | Admitting: Family Medicine

## 2021-08-24 NOTE — Telephone Encounter (Signed)
Left message for patient to call back and schedule Medicare Annual Wellness Visit (AWV) in office.  ° °If not able to come in office, please offer to do virtually or by telephone.  Left office number and my jabber #[phone]. ° °Due for AWVI ° °Please schedule at anytime with Nurse Health Advisor. °  °

## 2021-08-24 NOTE — Telephone Encounter (Signed)
Pt states that shes going out of town on 10/24, and would like to know if she could get her adderall refilled on 10/23 instead. Since her pharmacy will be closed 10/24. Please advise.    Medication: amphetamine-dextroamphetamine (ADDERALL XR) 20 MG 24 hr capsule  Has the patient contacted their pharmacy? Yes.   (If no, request that the patient contact the pharmacy for the refill.) (If yes, when and what did the pharmacy advise?)  Preferred Pharmacy (with phone number or street name):   [facility] - [place], [place] - [address]  [address], [place] [address]  Phone:  [phone]  Fax:  [phone]  Agent: Please be advised that RX refills may take up to 3 business days. We ask that you follow-up with your pharmacy.

## 2021-08-26 NOTE — Telephone Encounter (Signed)
Patient should be ok with getting med fill since they are closed on Sunday.  Advised patient to call pharmacy ahead of time to ask.

## 2021-09-07 ENCOUNTER — Other Ambulatory Visit: Payer: Self-pay | Admitting: Family Medicine

## 2021-09-07 ENCOUNTER — Ambulatory Visit (INDEPENDENT_AMBULATORY_CARE_PROVIDER_SITE_OTHER): Payer: Medicare PPO

## 2021-09-07 VITALS — Ht 65.0 in | Wt 135.0 lb

## 2021-09-07 DIAGNOSIS — Z1231 Encounter for screening mammogram for malignant neoplasm of breast: Secondary | ICD-10-CM | POA: Diagnosis not present

## 2021-09-07 DIAGNOSIS — F988 Other specified behavioral and emotional disorders with onset usually occurring in childhood and adolescence: Secondary | ICD-10-CM

## 2021-09-07 DIAGNOSIS — Z0000 Encounter for general adult medical examination without abnormal findings: Secondary | ICD-10-CM | POA: Diagnosis not present

## 2021-09-07 DIAGNOSIS — Z780 Asymptomatic menopausal state: Secondary | ICD-10-CM

## 2021-09-07 NOTE — Telephone Encounter (Signed)
Pt called the pharmacy and the pharmacy is stating they have no record of her refill. Please advise.      [facility] - [place], [place] - [address]  [address], [place] [address]  Phone:  [phone]  Fax:  [phone]

## 2021-09-07 NOTE — Patient Instructions (Signed)
[name] , Thank you for taking time to complete your Medicare Wellness Visit. I appreciate your ongoing commitment to your health goals. Please review the following plan we discussed and let me know if I can assist you in the future.   Screening recommendations/referrals: Colonoscopy: Per our conversation, you have Cologuard kit at home. Mammogram: Ordered today. Someone will call you to schedule. Bone Density: Ordered today. Someone will call you to schedule. Recommended yearly ophthalmology/optometry visit for glaucoma screening and checkup Recommended yearly dental visit for hygiene and checkup  Vaccinations: Influenza vaccine: Due-May obtain vaccine at our office or your local pharmacy. Pneumococcal vaccine: Up to date Tdap vaccine: Discuss with pharmacy Shingles vaccine: Discuss with pharmacy   Covid-19:Booster available at the pharmacy.  Advanced directives: Information mailed today  Conditions/risks identified: See problem list  Next appointment: Follow up in one year for your annual wellness visit    Preventive Care 65 Years and Older, Female Preventive care refers to lifestyle choices and visits with your health care provider that can promote health and wellness. What does preventive care include? A yearly physical exam. This is also called an annual well check. Dental exams once or twice a year. Routine eye exams. Ask your health care provider how often you should have your eyes checked. Personal lifestyle choices, including: Daily care of your teeth and gums. Regular physical activity. Eating a healthy diet. Avoiding tobacco and drug use. Limiting alcohol use. Practicing safe sex. Taking low-dose aspirin every day. Taking vitamin and mineral supplements as recommended by your health care provider. What happens during an annual well check? The services and screenings done by your health care provider during your annual well check will depend on your age, overall  health, lifestyle risk factors, and family history of disease. Counseling  Your health care provider may ask you questions about your: Alcohol use. Tobacco use. Drug use. Emotional well-being. Home and relationship well-being. Sexual activity. Eating habits. History of falls. Memory and ability to understand (cognition). Work and work Statistician. Reproductive health. Screening  You may have the following tests or measurements: Height, weight, and BMI. Blood pressure. Lipid and cholesterol levels. These may be checked every 5 years, or more frequently if you are over 46 years old. Skin check. Lung cancer screening. You may have this screening every year starting at age 55 if you have a 30-pack-year history of smoking and currently smoke or have quit within the past 15 years. Fecal occult blood test (FOBT) of the stool. You may have this test every year starting at age 58. Flexible sigmoidoscopy or colonoscopy. You may have a sigmoidoscopy every 5 years or a colonoscopy every 10 years starting at age 62. Hepatitis C blood test. Hepatitis B blood test. Sexually transmitted disease (STD) testing. Diabetes screening. This is done by checking your blood sugar (glucose) after you have not eaten for a while (fasting). You may have this done every 1-3 years. Bone density scan. This is done to screen for osteoporosis. You may have this done starting at age 83. Mammogram. This may be done every 1-2 years. Talk to your health care provider about how often you should have regular mammograms. Talk with your health care provider about your test results, treatment options, and if necessary, the need for more tests. Vaccines  Your health care provider may recommend certain vaccines, such as: Influenza vaccine. This is recommended every year. Tetanus, diphtheria, and acellular pertussis (Tdap, Td) vaccine. You may need a Td booster every 10 years.  Zoster vaccine. You may need this after age  38. Pneumococcal 13-valent conjugate (PCV13) vaccine. One dose is recommended after age 29. Pneumococcal polysaccharide (PPSV23) vaccine. One dose is recommended after age 8. Talk to your health care provider about which screenings and vaccines you need and how often you need them. This information is not intended to replace advice given to you by your health care provider. Make sure you discuss any questions you have with your health care provider. Document Released: 11/21/2015 Document Revised: 07/14/2016 Document Reviewed: 08/26/2015 Elsevier Interactive Patient Education  2017 [place] Prevention in the Home Falls can cause injuries. They can happen to people of all ages. There are many things you can do to make your home safe and to help prevent falls. What can I do on the outside of my home? Regularly fix the edges of walkways and driveways and fix any cracks. Remove anything that might make you trip as you walk through a door, such as a raised step or threshold. Trim any bushes or trees on the path to your home. Use bright outdoor lighting. Clear any walking paths of anything that might make someone trip, such as rocks or tools. Regularly check to see if handrails are loose or broken. Make sure that both sides of any steps have handrails. Any raised decks and porches should have guardrails on the edges. Have any leaves, snow, or ice cleared regularly. Use sand or salt on walking paths during winter. Clean up any spills in your garage right away. This includes oil or grease spills. What can I do in the bathroom? Use night lights. Install grab bars by the toilet and in the tub and shower. Do not use towel bars as grab bars. Use non-skid mats or decals in the tub or shower. If you need to sit down in the shower, use a plastic, non-slip stool. Keep the floor dry. Clean up any water that spills on the floor as soon as it happens. Remove soap buildup in the tub or shower  regularly. Attach bath mats securely with double-sided non-slip rug tape. Do not have throw rugs and other things on the floor that can make you trip. What can I do in the bedroom? Use night lights. Make sure that you have a light by your bed that is easy to reach. Do not use any sheets or blankets that are too big for your bed. They should not hang down onto the floor. Have a firm chair that has side arms. You can use this for support while you get dressed. Do not have throw rugs and other things on the floor that can make you trip. What can I do in the kitchen? Clean up any spills right away. Avoid walking on wet floors. Keep items that you use a lot in easy-to-reach places. If you need to reach something above you, use a strong step stool that has a grab bar. Keep electrical cords out of the way. Do not use floor polish or wax that makes floors slippery. If you must use wax, use non-skid floor wax. Do not have throw rugs and other things on the floor that can make you trip. What can I do with my stairs? Do not leave any items on the stairs. Make sure that there are handrails on both sides of the stairs and use them. Fix handrails that are broken or loose. Make sure that handrails are as long as the stairways. Check any carpeting to make sure that  it is firmly attached to the stairs. Fix any carpet that is loose or worn. Avoid having throw rugs at the top or bottom of the stairs. If you do have throw rugs, attach them to the floor with carpet tape. Make sure that you have a light switch at the top of the stairs and the bottom of the stairs. If you do not have them, ask someone to add them for you. What else can I do to help prevent falls? Wear shoes that: Do not have high heels. Have rubber bottoms. Are comfortable and fit you well. Are closed at the toe. Do not wear sandals. If you use a stepladder: Make sure that it is fully opened. Do not climb a closed stepladder. Make sure that  both sides of the stepladder are locked into place. Ask someone to hold it for you, if possible. Clearly mark and make sure that you can see: Any grab bars or handrails. First and last steps. Where the edge of each step is. Use tools that help you move around (mobility aids) if they are needed. These include: Canes. Walkers. Scooters. Crutches. Turn on the lights when you go into a dark area. Replace any light bulbs as soon as they burn out. Set up your furniture so you have a clear path. Avoid moving your furniture around. If any of your floors are uneven, fix them. If there are any pets around you, be aware of where they are. Review your medicines with your doctor. Some medicines can make you feel dizzy. This can increase your chance of falling. Ask your doctor what other things that you can do to help prevent falls. This information is not intended to replace advice given to you by your health care provider. Make sure you discuss any questions you have with your health care provider. Document Released: 08/21/2009 Document Revised: 04/01/2016 Document Reviewed: 11/29/2014 Elsevier Interactive Patient Education  2017 [employer].

## 2021-09-07 NOTE — Telephone Encounter (Signed)
Pt. States she is out of medication  Medication: amphetamine-dextroamphetamine (ADDERALL XR) 20 MG 24 hr capsule   Has the patient contacted their pharmacy? Yes.   (If no, request that the patient contact the pharmacy for the refill.) (If yes, when and what did the pharmacy advise?)  Preferred Pharmacy (with phone number or street name): [facility] - [place], [place] - [address]  [address], [place] [address]  Phone:  [phone]  Fax:  [phone]   Agent: Please be advised that RX refills may take up to 3 business days. We ask that you follow-up with your pharmacy.

## 2021-09-07 NOTE — Progress Notes (Signed)
Subjective:   [name] is a 75 y.o. female who presents for an Initial Medicare Annual Wellness Visit.  I connected with [name] today by telephone and verified that I am speaking with the correct person using two identifiers. Location patient: home Location provider: work [name], Marine scientist.    I discussed the limitations, risks, security and privacy concerns of performing an evaluation and management service by telephone and the availability of in person appointments. I also discussed with the patient that there may be a patient responsible charge related to this service. The patient expressed understanding and verbally consented to this telephonic visit.    Interactive audio and video telecommunications were attempted between this provider and patient, however failed, due to patient having technical difficulties OR patient did not have access to video capability.  We continued and completed visit with audio only.  Some vital signs may be absent or patient reported.   Time Spent with patient on telephone encounter: 40 minutes   Review of Systems     Cardiac Risk Factors include: advanced age (>7mn, >>20women);dyslipidemia;hypertension     Objective:    Today's Vitals   09/07/21 1342 09/07/21 1343  Weight: 135 lb (61.2 kg)   Height: 5' 5" (1.651 m)   PainSc:  5    Body mass index is 22.47 kg/m.  Advanced Directives 09/07/2021 03/06/2021 06/01/2019 05/31/2019 04/17/2019 12/25/2018 12/25/2018  Does Patient Have a Medical Advance Directive? _0  Yes Yes  Type of Advance Directive - - - - - HPress photographerLiving will H[place] will  Does patient want to make changes to medical advance directive? - - - - - No - Patient declined -  Copy of [place] Chart? - - - - - No - copy requested No - copy requested  Would patient like information on creating a medical advance  directive? Yes (MAU/Ambulatory/Procedural Areas - Information given) No - Patient declined No - Patient declined No - Patient declined Yes (Inpatient - patient requests chaplain consult to create a medical advance directive) - No - Patient declined    Current Medications (verified) Outpatient Encounter Medications as of 09/07/2021  Medication Sig   amphetamine-dextroamphetamine (ADDERALL XR) 20 MG 24 hr capsule TAKE TWO CAPSULES BY MOUTH EACH DAY   clonazePAM (KLONOPIN) 0.5 MG tablet 1 po bid prn   oxyCODONE (ROXICODONE) 15 MG immediate release tablet Take 1 tablet (15 mg total) by mouth 4 (four) times daily. (Patient taking differently: Take 15 mg by mouth 5 (five) times daily.)   SUMAtriptan (IMITREX) 50 MG tablet TAKE 1 TABLET BY MOUTH EVERY 2 HOURS AS NEEDED FOR MIGRAINE. MAX OF 2 DOSES IN 24 HOURS   venlafaxine XR (EFFEXOR-XR) 75 MG 24 hr capsule TAKE 2 CAPSULES BY MOUTH DAILY   No facility-administered encounter medications on file as of 09/07/2021.    Allergies (verified) Zanaflex [tizanidine] and Adhesive [tape]   History: Past Medical History:  Diagnosis Date   Abrasion of right heel    during admission 10/ 2019 right heel open skin due to movement on sheet   ADD (attention deficit disorder)    Anemia, mild    Anxiety    Arthritis    "joints, back"   At high risk for falls    10-19-2018 pt has fell twice in past week, tripped   Chronic back pain    "all over my back" (10/02/2013)  Crushing injury of arm, right 02/06/1989's   "it was crushed; wore cast from fingers to top of my shoulder" (11/25/2   Crushing injury of left wrist 05/11/1989's   DOE (dyspnea on exertion)    Essential tremor    Fibromyalgia    History of palpitations 2002   recurrent   History of panic attacks    History of pneumonia 08/21/2018   w/ acute respiratory failure/ hypoxia   History of sepsis    admission 08-21-2018  secondary to uropathy obstructive from ureteral stone    Hyperlipidemia     Hypertension    10-19-2018 PER PT AMLODIPINE ON HOLD SINCE 10/ 2019 DUE TO KIDNEY ISSUES PER DOCTOR   Idiopathic scoliosis 04/19/2013   Left ureteral stone    Migraines    "once/month" (10/02/2013)   Osteoporosis    Other vitamin B12 deficiency anemia    PONV (postoperative nausea and vomiting)    As a child   Pulmonary nodules/lesions, multiple    Renal insufficiency    S/P gastric bypass 09/16/2003   S/P insertion of spinal cord stimulator    per pt remote is missing   Wears dentures    upper   Wears glasses    Wears glasses    Past Surgical History:  Procedure Laterality Date   ABDOMINAL HYSTERECTOMY  1980's   W/  BSO  AND APPENDECTOMY   CARDIAC CATHETERIZATION  12-13-2002    dr Johnsie Cancel   normal coronaries   CYSTOSCOPY WITH URETEROSCOPY AND STENT PLACEMENT Left 09/27/2018   Procedure: LEFT  URETEROSCOPY, HOLMIUM LASER LITHOTRIPSY;  Surgeon: Ardis Hughs, MD;  Location: [facility];  Service: Urology;  Laterality: Left;   D & C HYSTERSCOPY RESECTION MYOMECTOMY  1980s   HUMERUS FRACTURE SURGERY Left 04/17/2019    Comminuted complex left intra-articular distal humerus fracture/supracondylar humerus fracture displaced   INTRAMEDULLARY (IM) NAIL INTERTROCHANTERIC Right 06/01/2019   Procedure: INTRAMEDULLARY (IM) NAIL INTERTROCHANTRIC;  Surgeon: Gaynelle Arabian, MD;  Location: [facility];  Service: Orthopedics;  Laterality: Right;   IR NEPHROSTOMY PLACEMENT LEFT  08/22/2018   IR NEPHROSTOMY PLACEMENT LEFT  12/08/2018   KNEE ARTHROSCOPY Right 03-31-2001    dr Shellia Carwin _0    NEPHROLITHOTOMY Left 10/20/2018   Procedure: LEFT NEPHROLITHOTOMY PERCUTANEOUS;  Surgeon: Ardis Hughs, MD;  Location: [facility];  Service: Urology;  Laterality: Left;   ORIF HUMERUS FRACTURE Left 04/17/2019   Procedure: Open reduction internal fixation left supracondylar humerus fracture with olecranon osteotomy and ulnar nerve release and anterior transposition and repair of structures as necessary;  Surgeon:  Roseanne Kaufman, MD;  Location: [facility];  Service: Orthopedics;  Laterality: Left;  3 hrs   REDUCTION MAMMAPLASTY Bilateral 2001   ROBOT ASSISTED LAPAROSCOPIC NEPHRECTOMY Left 12/25/2018   Procedure: XI ROBOTIC ASSISTED LAPAROSCOPIC NEPHRECTOMY, LEFT FLANK EXPLORATION WITH REMOVAL OF BATTERY PACK;  Surgeon: Ardis Hughs, MD;  Location: [facility];  Service: Urology;  Laterality: Left;   ROUX-EN-Y GASTRIC BYPASS  09-16-2003   dr Jerilynn Mages. Hassell Done  _1    via Laparoscopy w/ gastrojejunostomy   SPINAL CORD STIMULATOR IMPLANT  2016   10-19-2018  PER PT HAS REMOTE BUT HAS NOT USED IT SINCE DEC 2018   TONSILLECTOMY AND ADENOIDECTOMY  1955   TUBAL LIGATION  1980's   Family History  Adopted: Yes  Problem Relation Age of Onset   COPD Mother    Emphysema Mother    Heart attack Mother    Other Mother        tobacco abuse  Social History   Socioeconomic History   Marital status: Married    Spouse name: Not on file   Number of children: 2   Years of education: Not on file   Highest education level: Not on file  Occupational History   Occupation: Retired  Tobacco Use   Smoking status: Never   Smokeless tobacco: Never  Vaping Use   Vaping Use: Never used  Substance and Sexual Activity   Alcohol use: No   Drug use: No   Sexual activity: Never    Partners: Male  Other Topics Concern   Not on file  Social History Narrative   Not on file   Social Determinants of Health   Financial Resource Strain: Low Risk    Difficulty of Paying Living Expenses: Not hard at all  Food Insecurity: No Food Insecurity   Worried About Charity fundraiser in the Last Year: Never true   [place] in the Last Year: Never true  Transportation Needs: No Transportation Needs   Lack of Transportation (Medical): No   Lack of Transportation (Non-Medical): No  Physical Activity: Inactive   Days of Exercise per Week: 0 days   Minutes of Exercise per Session: 0 min  Stress: No Stress Concern Present   Feeling  of Stress : Not at all  Social Connections: Moderately Isolated   Frequency of Communication with Friends and Family: More than three times a week   Frequency of Social Gatherings with Friends and Family: Once a week   Attends Religious Services: Never   Marine scientist or Organizations: No   Attends Music therapist: Never   Marital Status: Married    Tobacco Counseling Counseling given: Not Answered   Clinical Intake:  Pre-visit preparation completed: Yes  Pain : 0-10 Pain Score: 5  Pain Type: Chronic pain Pain Location: Back Pain Onset: More than a month ago Pain Frequency: Constant     BMI - recorded: 22.47 Nutritional Status: BMI of 19-24  Normal Nutritional Risks: None Diabetes: No  How often do you need to have someone help you when you read instructions, pamphlets, or other written materials from your doctor or pharmacy?: 1 - Never  Diabetic?No  Interpreter Needed?: No  Information entered by :: Caroleen Hamman LPN   Activities of Daily Living In your present state of health, do you have any difficulty performing the following activities: 09/07/2021 09/07/2021  Hearing? Y Y  Comment hearing aids hearing aids  Vision? N N  Difficulty concentrating or making decisions? N -  Walking or climbing stairs? Y -  Dressing or bathing? N -  Doing errands, shopping? N -  Preparing Food and eating ? N -  Using the Toilet? N -  In the past six months, have you accidently leaked urine? Y -  Do you have problems with loss of bowel control? N -  Managing your Medications? N -  Managing your Finances? N -  Housekeeping or managing your Housekeeping? Y -  Some recent data might be hidden    Patient Care Team: Carollee Herter, Alferd Apa, DO as PCP - General Dorothy Spark, MD (Inactive) as PCP - [place] [facility] (Cardiology)  Indicate any recent Medical Services you may have received from other than [facility] providers in the past year (date may be  approximate).     Assessment:   This is a routine wellness examination for Bren[place].  Hearing/Vision screen Hearing Screening - Comments:: Bilateral hearing aids ordered  Dietary issues and exercise activities discussed: Current Exercise Habits: The patient does not participate in regular exercise at present, Exercise limited by: orthopedic condition(s)   Goals Addressed             This Visit's Progress    Patient Stated       Eat a more balanced diet       Depression Screen PHQ 2/9 Scores 09/07/2021 12/03/2016 10/11/2016 07/01/2015 04/22/2014  PHQ - 2 Score 1 0 0 0 0    Fall Risk Fall Risk  09/07/2021 10/16/2018 12/03/2016 10/11/2016 07/01/2015  Falls in the past year? 1 1 Yes Yes Yes  Number falls in past yr: 0 1 2 or more 2 or more 1  Injury with Fall? 0 1 No (No Data) Yes  Comment - - - pt report having 2 falls, pt report tripping over item in the store and hurting head, eye, and back. -  Risk for fall due to : - - - - Impaired balance/gait  Follow up Falls prevention discussed Falls evaluation completed;Education provided - - -    FALL RISK PREVENTION PERTAINING TO THE HOME:  Any stairs in or around the home? Yes  If so, are there any without handrails? No  Home free of loose throw rugs in walkways, pet beds, electrical cords, etc? Yes  Adequate lighting in your home to reduce risk of falls? Yes   ASSISTIVE DEVICES UTILIZED TO PREVENT FALLS:  Life alert? No  Use of a cane, walker or w/c? Yes  Grab bars in the bathroom? No  Shower chair or bench in shower? Yes  Elevated toilet seat or a handicapped toilet? No   TIMED UP AND GO:  Was the test performed? No . Phone visit   Cognitive Function:Normal cognitive status assessed by this Nurse Health Advisor. No abnormalities found.     Montreal Cognitive Assessment  01/15/2015  Visuospatial/ Executive (0/5) 5  Naming (0/3) 3  Attention: Read list of digits (0/2) 2  Attention: Read list of letters (0/1) 1   Attention: Serial 7 subtraction starting at 100 (0/3) 3  Language: Repeat phrase (0/2) 2  Language : Fluency (0/1) 1  Abstraction (0/2) 2  Delayed Recall (0/5) 5  Orientation (0/6) 5  Total 29  Adjusted Score (based on education) 29      Immunizations Immunization History  Administered Date(s) Administered   Influenza, High Dose Seasonal PF 09/03/2016, 07/04/2017, 08/26/2018   Influenza,inj,Quad PF,6+ Mos 10/15/2013, 11/28/2014   Moderna SARS-COV2 Booster Vaccination 09/22/2020   Pneumococcal Conjugate-13 10/15/2013   Pneumococcal Polysaccharide-23 12/05/2014   Td 10/28/2009   Zoster, Live 10/28/2009    TDAP status: Due, Education has been provided regarding the importance of this vaccine. Advised may receive this vaccine at local pharmacy or Health Dept. Aware to provide a copy of the vaccination record if obtained from local pharmacy or Health Dept. Verbalized acceptance and understanding.  Flu Vaccine status: Due, Education has been provided regarding the importance of this vaccine. Advised may receive this vaccine at local pharmacy or Health Dept. Aware to provide a copy of the vaccination record if obtained from local pharmacy or Health Dept. Verbalized acceptance and understanding.  Pneumococcal vaccine status: Up to date  Covid-19 vaccine status: Information provided on how to obtain vaccines.   Qualifies for Shingles Vaccine? Yes   Zostavax completed Yes   Shingrix Completed?: No.    Education has been provided regarding the importance of this vaccine. Patient has been advised to call insurance  company to determine out of pocket expense if they have not yet received this vaccine. Advised may also receive vaccine at local pharmacy or Health Dept. Verbalized acceptance and understanding.  Screening Tests Health Maintenance  Topic Date Due   COVID-19 Vaccine (1) 07/27/1946   URINE MICROALBUMIN  Never done   Zoster Vaccines- Shingrix (1 of 2) Never done   COLONOSCOPY  (Pts 45-37yr Insurance coverage will need to be confirmed)  02/27/2013   MAMMOGRAM  04/09/2014   TETANUS/TDAP  10/29/2019   INFLUENZA VACCINE  06/08/2021   Pneumonia Vaccine 75 Years old  Completed   DEXA SCAN  Completed   Hepatitis C Screening  Completed   HPV VACCINES  Aged Out    Health Maintenance  Health Maintenance Due  Topic Date Due   COVID-19 Vaccine (1) 07/27/1946   URINE MICROALBUMIN  Never done   Zoster Vaccines- Shingrix (1 of 2) Never done   COLONOSCOPY (Pts 45-479yrInsurance coverage will need to be confirmed)  02/27/2013   MAMMOGRAM  04/09/2014   TETANUS/TDAP  10/29/2019   INFLUENZA VACCINE  06/08/2021    Colorectal cancer screening: Patient states she has a Cologuard kit at home.  Mammogram status: Ordered today. Pt provided with contact info and advised to call to schedule appt.   Bone Density status: Ordered today. Pt provided with contact info and advised to call to schedule appt.  Lung Cancer Screening: (Low Dose CT Chest recommended if Age 73-80ears, 30 pack-year currently smoking OR have quit w/in 15years.) does not qualify.     Additional Screening:  Hepatitis C Screening: Completed 02/23/2016  Vision Screening: Recommended annual ophthalmology exams for early detection of glaucoma and other disorders of the eye. Is the patient up to date with their annual eye exam?  Yes  Who is the provider or what is the name of the office in which the patient attends annual eye exams? Pt unsure of name   Dental Screening: Recommended annual dental exams for proper oral hygiene  Community Resource Referral / Chronic Care Management: CRR required this visit?  No   CCM required this visit?  No      Plan:     I have personally reviewed and noted the following in the patient's chart:   Medical and social history Use of alcohol, tobacco or illicit drugs  Current medications and supplements including opioid prescriptions. Patient is currently taking  opioid prescriptions. Information provided to patient regarding non-opioid alternatives. Patient advised to discuss non-opioid treatment plan with their provider. Functional ability and status Nutritional status Physical activity Advanced directives List of other physicians Hospitalizations, surgeries, and ER visits in previous 12 months Vitals Screenings to include cognitive, depression, and falls Referrals and appointments  In addition, I have reviewed and discussed with patient certain preventive protocols, quality metrics, and best practice recommendations. A written personalized care plan for preventive services as well as general preventive health recommendations were provided to patient.   Due to this being a telephonic visit, the after visit summary with patients personalized plan was offered to patient via mail or my-chart. Per request, patient was mailed a copy of AV[place]an[place] RapidsLPN   [id number] Health Advisor  Nurse Notes: None

## 2021-09-08 MED ORDER — AMPHETAMINE-DEXTROAMPHET ER 20 MG PO CP24: ORAL_CAPSULE | ORAL | 0 refills | Status: DC

## 2021-09-08 NOTE — Telephone Encounter (Signed)
*  This is patient's regular refill. Last refill was due to orange juice being spilled on meds.*  Requesting: Adderall XR Contract: 2019 UDS: 2019 Last OV: 03/06/2021 Next OV: N/A  Last Refill: 08/19/21, #30--0 RF  Database:   Please advise

## 2021-09-14 ENCOUNTER — Telehealth (INDEPENDENT_AMBULATORY_CARE_PROVIDER_SITE_OTHER): Payer: Medicare PPO | Admitting: Family Medicine

## 2021-09-14 ENCOUNTER — Other Ambulatory Visit: Payer: Self-pay

## 2021-09-14 ENCOUNTER — Encounter: Payer: Self-pay | Admitting: Family Medicine

## 2021-09-14 DIAGNOSIS — A084 Viral intestinal infection, unspecified: Secondary | ICD-10-CM | POA: Diagnosis not present

## 2021-09-14 MED ORDER — ONDANSETRON 4 MG PO TBDP: 4.0000 mg | ORAL_TABLET | Freq: Three times a day (TID) | ORAL | 0 refills | Status: DC | PRN

## 2021-09-14 NOTE — Progress Notes (Signed)
Chief Complaint  Patient presents with   Nausea   Emesis   Diarrhea     Subjective [name] is a 75 y.o. female who presents with vomiting and diarrhea. Due to COVID-19 pandemic, we are interacting via web portal for an electronic face-to-face visit. I verified patient's ID using 2 identifiers. Patient agreed to proceed with visit via this method. Patient is at home, I am at office. Patient, her spouse and I are present for visit.   Symptoms began 2 d ago.  Patient has vomiting, diarrhea, and nausea, fatigue Patient denies abdominal pain, cramping, fever, arthralgias, URI symptoms, and cough Treatment to date: Pepto Bismol, oxycodone Sick contacts: none known  Past Medical History:  Diagnosis Date   Abrasion of right heel    during admission 10/ 2019 right heel open skin due to movement on sheet   ADD (attention deficit disorder)    Anemia, mild    Anxiety    Arthritis    "joints, back"   At high risk for falls    10-19-2018 pt has fell twice in past week, tripped   Chronic back pain    "all over my back" (10/02/2013)   Crushing injury of arm, right 02/06/1989's   "it was crushed; wore cast from fingers to top of my shoulder" (11/25/2   Crushing injury of left wrist 05/11/1989's   DOE (dyspnea on exertion)    Essential tremor    Fibromyalgia    History of palpitations 2002   recurrent   History of panic attacks    History of pneumonia 08/21/2018   w/ acute respiratory failure/ hypoxia   History of sepsis    admission 08-21-2018  secondary to uropathy obstructive from ureteral stone    Hyperlipidemia    Hypertension    10-19-2018 PER PT AMLODIPINE ON HOLD SINCE 10/ 2019 DUE TO KIDNEY ISSUES PER DOCTOR   Idiopathic scoliosis 04/19/2013   Left ureteral stone    Migraines    "once/month" (10/02/2013)   Osteoporosis    Other vitamin B12 deficiency anemia    PONV (postoperative nausea and vomiting)    As a child   Pulmonary nodules/lesions, multiple    Renal  insufficiency    S/P gastric bypass 09/16/2003   S/P insertion of spinal cord stimulator    per pt remote is missing   Wears dentures    upper   Wears glasses    Wears glasses     Exam No conversational dyspnea Age appropriate judgment and insight Nml affect and mood  Assessment and Plan  Viral gastroenteritis - Plan: ondansetron (ZOFRAN-ODT) 4 MG disintegrating tablet  Orders as above. Try to avoid antidiarrheals for now. If no better by Th, will message and we will set up GI panel.  F/u if symptoms fail to improve, sooner if worsening. The patient and her spouse voiced understanding and agreement to the plan.  Jilda Roche [place], DO 09/14/21  12:44 PM

## 2021-09-17 DIAGNOSIS — M40209 Unspecified kyphosis, site unspecified: Secondary | ICD-10-CM | POA: Diagnosis not present

## 2021-09-17 DIAGNOSIS — M4120 Other idiopathic scoliosis, site unspecified: Secondary | ICD-10-CM | POA: Diagnosis not present

## 2021-09-17 DIAGNOSIS — M4722 Other spondylosis with radiculopathy, cervical region: Secondary | ICD-10-CM | POA: Diagnosis not present

## 2021-09-17 DIAGNOSIS — G894 Chronic pain syndrome: Secondary | ICD-10-CM | POA: Diagnosis not present

## 2021-09-23 NOTE — Telephone Encounter (Signed)
Error

## 2021-09-29 ENCOUNTER — Other Ambulatory Visit: Payer: Self-pay | Admitting: Family Medicine

## 2021-09-29 DIAGNOSIS — F411 Generalized anxiety disorder: Secondary | ICD-10-CM

## 2021-10-08 ENCOUNTER — Other Ambulatory Visit: Payer: Self-pay | Admitting: Family Medicine

## 2021-10-08 DIAGNOSIS — F419 Anxiety disorder, unspecified: Secondary | ICD-10-CM

## 2021-10-09 NOTE — Telephone Encounter (Signed)
Requesting: Klonopin Contract: 2019 UDS: 2019 Last OV: 09/14/21 Next OV: N/A Last Refill: 08/03/2021, #60--1 RF Database:   Please advise

## 2021-10-30 ENCOUNTER — Other Ambulatory Visit: Payer: Self-pay | Admitting: Family Medicine

## 2021-10-30 DIAGNOSIS — F411 Generalized anxiety disorder: Secondary | ICD-10-CM

## 2021-10-31 ENCOUNTER — Other Ambulatory Visit: Payer: Self-pay | Admitting: Family Medicine

## 2021-10-31 DIAGNOSIS — F411 Generalized anxiety disorder: Secondary | ICD-10-CM

## 2021-11-03 ENCOUNTER — Other Ambulatory Visit: Payer: Self-pay | Admitting: Family Medicine

## 2021-11-03 DIAGNOSIS — F988 Other specified behavioral and emotional disorders with onset usually occurring in childhood and adolescence: Secondary | ICD-10-CM

## 2021-11-03 DIAGNOSIS — F411 Generalized anxiety disorder: Secondary | ICD-10-CM

## 2021-11-03 DIAGNOSIS — F419 Anxiety disorder, unspecified: Secondary | ICD-10-CM

## 2021-11-03 NOTE — Telephone Encounter (Signed)
Pt was requesting enough medication to last her till her appt on 1/5.    Medication: clonazePAM (KLONOPIN) 0.5 MG tablet   amphetamine-dextroamphetamine (ADDERALL XR) 20 MG 24 hr capsule  venlafaxine XR (EFFEXOR-XR) 75 MG 24 hr capsule   Has the patient contacted their pharmacy? No.   Preferred Pharmacy : [facility] - [place], [place] - [address]  [address], [place] [address]  Phone:  [phone]  Fax:  [phone]

## 2021-11-03 NOTE — Telephone Encounter (Signed)
Requesting: Adderall XR Contract: 2021 UDS: 2021  Last OV: 09/14/2021 Next OV: 11/12/2021 Last Refill: 09/08/2021, #60--0 RF Database:   Please advise

## 2021-11-05 NOTE — Telephone Encounter (Signed)
Patient would like enough venlafaxine XR (EFFEXOR-XR) 75 MG 24 hr capsule  pills to last her until her 01/05 appointment. Please advice.   Preferred Pharmacy : [facility] - [place], [place] - [address]  [address], [place] [address]  Phone:  [phone]  Fax:  [phone]

## 2021-11-06 MED ORDER — VENLAFAXINE HCL ER 75 MG PO CP24: 150.0000 mg | ORAL_CAPSULE | Freq: Every day | ORAL | 0 refills | Status: DC

## 2021-11-06 MED ORDER — AMPHETAMINE-DEXTROAMPHET ER 20 MG PO CP24: ORAL_CAPSULE | ORAL | 0 refills | Status: DC

## 2021-11-11 ENCOUNTER — Other Ambulatory Visit: Payer: Self-pay | Admitting: Family Medicine

## 2021-11-11 DIAGNOSIS — R519 Headache, unspecified: Secondary | ICD-10-CM

## 2021-11-12 ENCOUNTER — Other Ambulatory Visit: Payer: Self-pay

## 2021-11-12 ENCOUNTER — Encounter: Payer: Self-pay | Admitting: Family Medicine

## 2021-11-12 ENCOUNTER — Ambulatory Visit: Payer: Medicare PPO | Admitting: Family Medicine

## 2021-11-12 ENCOUNTER — Ambulatory Visit (HOSPITAL_BASED_OUTPATIENT_CLINIC_OR_DEPARTMENT_OTHER)
Admission: RE | Admit: 2021-11-12 | Discharge: 2021-11-12 | Disposition: A | Discharge status: Home / Self Care | Payer: Medicare PPO | Source: Ambulatory Visit | Attending: Family Medicine | Admitting: Family Medicine

## 2021-11-12 VITALS — BP 140/100 | HR 94 | Temp 98.3°F | Resp 20 | Ht 65.0 in | Wt 138.2 lb

## 2021-11-12 DIAGNOSIS — R0602 Shortness of breath: Secondary | ICD-10-CM | POA: Insufficient documentation

## 2021-11-12 DIAGNOSIS — Z79899 Other long term (current) drug therapy: Secondary | ICD-10-CM | POA: Diagnosis not present

## 2021-11-12 DIAGNOSIS — I700 Atherosclerosis of aorta: Secondary | ICD-10-CM | POA: Diagnosis not present

## 2021-11-12 DIAGNOSIS — R030 Elevated blood-pressure reading, without diagnosis of hypertension: Secondary | ICD-10-CM | POA: Diagnosis not present

## 2021-11-12 DIAGNOSIS — Z87448 Personal history of other diseases of urinary system: Secondary | ICD-10-CM | POA: Diagnosis not present

## 2021-11-12 DIAGNOSIS — F419 Anxiety disorder, unspecified: Secondary | ICD-10-CM | POA: Diagnosis not present

## 2021-11-12 DIAGNOSIS — F988 Other specified behavioral and emotional disorders with onset usually occurring in childhood and adolescence: Secondary | ICD-10-CM

## 2021-11-12 DIAGNOSIS — G894 Chronic pain syndrome: Secondary | ICD-10-CM | POA: Diagnosis not present

## 2021-11-12 DIAGNOSIS — F411 Generalized anxiety disorder: Secondary | ICD-10-CM

## 2021-11-12 MED ORDER — CLONAZEPAM 0.5 MG PO TABS: ORAL_TABLET | ORAL | 1 refills | Status: DC

## 2021-11-12 MED ORDER — VENLAFAXINE HCL ER 75 MG PO CP24: 150.0000 mg | ORAL_CAPSULE | Freq: Every day | ORAL | 1 refills | Status: DC

## 2021-11-12 NOTE — Assessment & Plan Note (Signed)
Pt requesting inc in adderall but due to high bp we can not  Recheck bp 2-3 weeks

## 2021-11-12 NOTE — Progress Notes (Signed)
Established Patient Office Visit  Subjective:  Patient ID: [name], female    DOB: 1945/12/15  Age: 76 y.o. MRN: [id number]  CC:  Chief Complaint  Patient presents with   ADD   Anxiety   Follow-up    HPI [name] presents for f/u anxiety and add.  She is requesting an inc in her adderall.    Past Medical History:  Diagnosis Date   Abrasion of right heel    during admission 10/ 2019 right heel open skin due to movement on sheet   ADD (attention deficit disorder)    Anemia, mild    Anxiety    Arthritis    "joints, back"   At high risk for falls    10-19-2018 pt has fell twice in past week, tripped   Chronic back pain    "all over my back" (10/02/2013)   Crushing injury of arm, right 02/06/1989's   "it was crushed; wore cast from fingers to top of my shoulder" (11/25/2   Crushing injury of left wrist 05/11/1989's   DOE (dyspnea on exertion)    Essential tremor    Fibromyalgia    History of palpitations 2002   recurrent   History of panic attacks    History of pneumonia 08/21/2018   w/ acute respiratory failure/ hypoxia   History of sepsis    admission 08-21-2018  secondary to uropathy obstructive from ureteral stone    Hyperlipidemia    Hypertension    10-19-2018 PER PT AMLODIPINE ON HOLD SINCE 10/ 2019 DUE TO KIDNEY ISSUES PER DOCTOR   Idiopathic scoliosis 04/19/2013   Left ureteral stone    Migraines    "once/month" (10/02/2013)   Osteoporosis    Other vitamin B12 deficiency anemia    PONV (postoperative nausea and vomiting)    As a child   Pulmonary nodules/lesions, multiple    Renal insufficiency    S/P gastric bypass 09/16/2003   S/P insertion of spinal cord stimulator    per pt remote is missing   Wears dentures    upper   Wears glasses    Wears glasses     Past Surgical History:  Procedure Laterality Date   ABDOMINAL HYSTERECTOMY  1980's   W/  BSO  AND APPENDECTOMY   CARDIAC CATHETERIZATION  12-13-2002    dr Johnsie Cancel   normal coronaries    CYSTOSCOPY WITH URETEROSCOPY AND STENT PLACEMENT Left 09/27/2018   Procedure: LEFT  URETEROSCOPY, HOLMIUM LASER LITHOTRIPSY;  Surgeon: Ardis Hughs, MD;  Location: [facility];  Service: Urology;  Laterality: Left;   D & C HYSTERSCOPY RESECTION MYOMECTOMY  1980s   HUMERUS FRACTURE SURGERY Left 04/17/2019    Comminuted complex left intra-articular distal humerus fracture/supracondylar humerus fracture displaced   INTRAMEDULLARY (IM) NAIL INTERTROCHANTERIC Right 06/01/2019   Procedure: INTRAMEDULLARY (IM) NAIL INTERTROCHANTRIC;  Surgeon: Gaynelle Arabian, MD;  Location: [facility];  Service: Orthopedics;  Laterality: Right;   IR NEPHROSTOMY PLACEMENT LEFT  08/22/2018   IR NEPHROSTOMY PLACEMENT LEFT  12/08/2018   KNEE ARTHROSCOPY Right 03-31-2001    dr Shellia Carwin @[facility]    NEPHROLITHOTOMY Left 10/20/2018   Procedure: LEFT NEPHROLITHOTOMY PERCUTANEOUS;  Surgeon: Ardis Hughs, MD;  Location: [facility];  Service: Urology;  Laterality: Left;   ORIF HUMERUS FRACTURE Left 04/17/2019   Procedure: Open reduction internal fixation left supracondylar humerus fracture with olecranon osteotomy and ulnar nerve release and anterior transposition and repair of structures as necessary;  Surgeon: Roseanne Kaufman, MD;  Location: [place];  Service: Orthopedics;  Laterality: Left;  3 hrs   REDUCTION MAMMAPLASTY Bilateral 2001   ROBOT ASSISTED LAPAROSCOPIC NEPHRECTOMY Left 12/25/2018   Procedure: XI ROBOTIC ASSISTED LAPAROSCOPIC NEPHRECTOMY, LEFT FLANK EXPLORATION WITH REMOVAL OF BATTERY PACK;  Surgeon: Ardis Hughs, MD;  Location: [facility];  Service: Urology;  Laterality: Left;   ROUX-EN-Y GASTRIC BYPASS  09-16-2003   dr Jerilynn Mages. Hassell Done  @[facility]    via Laparoscopy w/ gastrojejunostomy   SPINAL CORD STIMULATOR IMPLANT  2016   10-19-2018  PER PT HAS REMOTE BUT HAS NOT USED IT SINCE DEC 2018   TONSILLECTOMY AND ADENOIDECTOMY  1955   TUBAL LIGATION  1980's    Family History  Adopted: Yes  Problem Relation Age of Onset   COPD  Mother    Emphysema Mother    Heart attack Mother    Other Mother        tobacco abuse    Social History   Socioeconomic History   Marital status: Married    Spouse name: Not on file   Number of children: 2   Years of education: Not on file   Highest education level: Not on file  Occupational History   Occupation: Retired  Tobacco Use   Smoking status: Never   Smokeless tobacco: Never  Vaping Use   Vaping Use: Never used  Substance and Sexual Activity   Alcohol use: No   Drug use: No   Sexual activity: Never    Partners: Male  Other Topics Concern   Not on file  Social History Narrative   Not on file   Social Determinants of Health   Financial Resource Strain: Low Risk    Difficulty of Paying Living Expenses: Not hard at all  Food Insecurity: No Food Insecurity   Worried About Charity fundraiser in the Last Year: Never true   [place] in the Last Year: Never true  Transportation Needs: No Transportation Needs   Lack of Transportation (Medical): No   Lack of Transportation (Non-Medical): No  Physical Activity: Inactive   Days of Exercise per Week: 0 days   Minutes of Exercise per Session: 0 min  Stress: No Stress Concern Present   Feeling of Stress : Not at all  Social Connections: Moderately Isolated   Frequency of Communication with Friends and Family: More than three times a week   Frequency of Social Gatherings with Friends and Family: Once a week   Attends Religious Services: Never   Marine scientist or Organizations: No   Attends Music therapist: Never   Marital Status: Married  Human resources officer Violence: Not At Risk   Fear of Current or Ex-Partner: No   Emotionally Abused: No   Physically Abused: No   Sexually Abused: No    Outpatient Medications Prior to Visit  Medication Sig Dispense Refill   amphetamine-dextroamphetamine (ADDERALL XR) 20 MG 24 hr capsule TAKE TWO CAPSULES BY MOUTH EACH DAY 60 capsule 0   ondansetron  (ZOFRAN-ODT) 4 MG disintegrating tablet Take 1 tablet (4 mg total) by mouth every 8 (eight) hours as needed for nausea or vomiting. 20 tablet 0   oxyCODONE (ROXICODONE) 15 MG immediate release tablet Take 1 tablet (15 mg total) by mouth 4 (four) times daily. (Patient taking differently: Take 15 mg by mouth 5 (five) times daily.) 120 tablet 0   SUMAtriptan (IMITREX) 50 MG tablet TAKE 1 TABLET BY MOUTH EVERY 2 HOURS AS NEEDED FOR MIGRAINE. MAX OF 2 DOSES IN 24  HOURS 10 tablet 1   clonazePAM (KLONOPIN) 0.5 MG tablet TAKE 1 TABLET BY MOUTH TWICE DAILY AS NEEDED 60 tablet 1   venlafaxine XR (EFFEXOR-XR) 75 MG 24 hr capsule Take 2 capsules (150 mg total) by mouth daily. 60 capsule 0   No facility-administered medications prior to visit.    Allergies  Allergen Reactions   Zanaflex [Tizanidine]     hallucinations   Adhesive [Tape] Itching    ROS Review of Systems  Constitutional:  Negative for chills and fever.  HENT:  Negative for congestion and hearing loss.   Eyes:  Negative for discharge.  Respiratory:  Negative for cough and shortness of breath.   Cardiovascular:  Negative for chest pain, palpitations and leg swelling.  Gastrointestinal:  Negative for abdominal pain, blood in stool, constipation, diarrhea, nausea and vomiting.  Genitourinary:  Negative for dysuria, frequency, hematuria and urgency.  Musculoskeletal:  Negative for back pain and myalgias.  Skin:  Negative for rash.  Allergic/Immunologic: Negative for environmental allergies.  Neurological:  Negative for dizziness, weakness and headaches.  Hematological:  Does not bruise/bleed easily.  Psychiatric/Behavioral:  Negative for suicidal ideas. The patient is not nervous/anxious.      Objective:    Physical Exam Vitals and nursing note reviewed.  Constitutional:      Appearance: She is well-developed.  HENT:     Head: Normocephalic and atraumatic.  Eyes:     Conjunctiva/sclera: Conjunctivae normal.  Neck:     Thyroid:  No thyromegaly.     Vascular: No carotid bruit or JVD.  Cardiovascular:     Rate and Rhythm: Normal rate and regular rhythm.     Heart sounds: Normal heart sounds. No murmur heard. Pulmonary:     Effort: Pulmonary effort is normal. No respiratory distress.     Breath sounds: Normal breath sounds. No wheezing or rales.  Chest:     Chest wall: No tenderness.  Musculoskeletal:     Cervical back: Normal range of motion and neck supple.  Neurological:     Mental Status: She is alert and oriented to person, place, and time.  Psychiatric:        Mood and Affect: Mood normal.        Behavior: Behavior normal.        Thought Content: Thought content normal.        Judgment: Judgment normal.    BP (!) 140/100 (BP Location: Left Arm, Patient Position: Sitting, Cuff Size: Normal)    Pulse 94    Temp 98.3 F (36.8 C) (Oral)    Resp 20    Ht 5\' 5"  (1.651 m)    Wt 138 lb 3.2 oz (62.7 kg)    SpO2 96%    BMI 23.00 kg/m  Wt Readings from Last 3 Encounters:  11/12/21 138 lb 3.2 oz (62.7 kg)  09/07/21 135 lb (61.2 kg)  03/06/21 113 lb 15.7 oz (51.7 kg)     Health Maintenance Due  Topic Date Due   COVID-19 Vaccine (1) 07/27/1946   URINE MICROALBUMIN  Never done   Zoster Vaccines- Shingrix (1 of 2) Never done   COLONOSCOPY (Pts 45-49yrs Insurance coverage will need to be confirmed)  02/27/2013   MAMMOGRAM  04/09/2014   TETANUS/TDAP  10/29/2019   INFLUENZA VACCINE  06/08/2021    There are no preventive care reminders to display for this patient.  Lab Results  Component Value Date   TSH 1.925 08/30/2018   Lab Results  Component Value Date  WBC 6.8 03/06/2021   HGB 15.3 (H) 03/06/2021   HCT 46.6 (H) 03/06/2021   MCV 97.3 03/06/2021   PLT 205 03/06/2021   Lab Results  Component Value Date   NA 133 (L) 03/06/2021   K 3.0 (L) 03/06/2021   CO2 23 03/06/2021   GLUCOSE 102 (H) 03/06/2021   BUN 28 (H) 03/06/2021   CREATININE 1.73 (H) 03/06/2021   BILITOT 0.6 03/06/2021   ALKPHOS 122  03/06/2021   AST 20 03/06/2021   ALT 11 03/06/2021   PROT 8.0 03/06/2021   ALBUMIN 4.4 03/06/2021   CALCIUM 9.4 03/06/2021   ANIONGAP 12 03/06/2021   GFR 50.70 (L) 09/22/2020   Lab Results  Component Value Date   CHOL 160 09/22/2020   Lab Results  Component Value Date   HDL 77.60 09/22/2020   Lab Results  Component Value Date   LDLCALC 63 09/22/2020   Lab Results  Component Value Date   TRIG 97.0 09/22/2020   Lab Results  Component Value Date   CHOLHDL 2 09/22/2020   Lab Results  Component Value Date   HGBA1C 5.3 04/22/2014      Assessment & Plan:   Problem List Items Addressed This Visit       Unprioritized   Attention deficit disorder (ADD) in adult - Primary    Pt requesting inc in adderall but due to high bp we can not  Recheck bp 2-3 weeks       Elevated BP without diagnosis of hypertension    Poorly controlled, encouraged DASH diet, minimize caffeine and obtain adequate sleep. Report concerning symptoms and follow up as directed and as needed Recheck 2-3 weeks       Generalized anxiety disorder    Stable  Uds/ contract updated      Relevant Medications   venlafaxine XR (EFFEXOR-XR) 75 MG 24 hr capsule   Other Visit Diagnoses     Anxiety       Relevant Medications   venlafaxine XR (EFFEXOR-XR) 75 MG 24 hr capsule   clonazePAM (KLONOPIN) 0.5 MG tablet   High risk medication use       Relevant Orders   Drug Tox Monitor 1 w/Conf, Oral Fld   History of renal insufficiency       SOB (shortness of breath)       Relevant Orders   DG Chest 2 View       Meds ordered this encounter  Medications   venlafaxine XR (EFFEXOR-XR) 75 MG 24 hr capsule    Sig: Take 2 capsules (150 mg total) by mouth daily.    Dispense:  180 capsule    Refill:  1   clonazePAM (KLONOPIN) 0.5 MG tablet    Sig: TAKE 1 TABLET BY MOUTH TWICE DAILY AS NEEDED    Dispense:  60 tablet    Refill:  1    Follow-up: Return in about 2 weeks (around 11/26/2021) for  hypertension.    Ann Held, DO

## 2021-11-12 NOTE — Assessment & Plan Note (Signed)
Stable  Uds/ contract updated

## 2021-11-12 NOTE — Patient Instructions (Signed)

## 2021-11-12 NOTE — Assessment & Plan Note (Signed)
.  Poorly controlled , encouraged DASH diet, minimize caffeine and obtain adequate sleep. Report concerning symptoms and follow up as directed and as needed °Recheck 2-3 weeks  °

## 2021-11-16 LAB — DRUG TOX MONITOR 1 W/CONF, ORAL FLD
Amphetamine: >500 ng/mL — ABNORMAL HIGH (ref <10)
Amphetamines: POSITIVE ng/mL — AB (ref <10)
Barbiturates: NEGATIVE ng/mL (ref <10)
Benzodiazepines: NEGATIVE ng/mL (ref <0.50)
Buprenorphine: NEGATIVE ng/mL (ref <0.10)
Cocaine: NEGATIVE ng/mL (ref <5.0)
Codeine: NEGATIVE ng/mL (ref <2.5)
Dihydrocodeine: NEGATIVE ng/mL (ref <2.5)
Fentanyl: NEGATIVE ng/mL (ref <0.10)
Heroin Metabolite: NEGATIVE ng/mL (ref <1.0)
Hydrocodone: NEGATIVE ng/mL (ref <2.5)
Hydromorphone: NEGATIVE ng/mL (ref <2.5)
MARIJUANA: NEGATIVE ng/mL (ref <2.5)
MDMA: NEGATIVE ng/mL (ref <10)
Meprobamate: NEGATIVE ng/mL (ref <2.5)
Methadone: NEGATIVE ng/mL (ref <5.0)
Methamphetamine: NEGATIVE ng/mL (ref <10)
Morphine: NEGATIVE ng/mL (ref <2.5)
Nicotine Metabolite: NEGATIVE ng/mL (ref <5.0)
Norhydrocodone: NEGATIVE ng/mL (ref <2.5)
Noroxycodone: >250 ng/mL — ABNORMAL HIGH (ref <2.5)
Opiates: POSITIVE ng/mL — AB (ref <2.5)
Oxycodone: 246.3 ng/mL — ABNORMAL HIGH (ref <2.5)
Oxymorphone: 4 ng/mL — ABNORMAL HIGH (ref <2.5)
Phencyclidine: NEGATIVE ng/mL (ref <10)
Tapentadol: NEGATIVE ng/mL (ref <5.0)
Tramadol: NEGATIVE ng/mL (ref <5.0)
Zolpidem: NEGATIVE ng/mL (ref <5.0)

## 2021-11-23 ENCOUNTER — Other Ambulatory Visit: Payer: Self-pay | Admitting: Family Medicine

## 2021-11-23 DIAGNOSIS — I10 Essential (primary) hypertension: Secondary | ICD-10-CM

## 2021-11-23 MED ORDER — LOSARTAN POTASSIUM 50 MG PO TABS: 50.0000 mg | ORAL_TABLET | Freq: Every day | ORAL | 1 refills | Status: DC

## 2021-11-26 ENCOUNTER — Ambulatory Visit: Payer: Medicare PPO | Admitting: Family Medicine

## 2021-11-30 ENCOUNTER — Other Ambulatory Visit: Payer: Self-pay

## 2021-11-30 ENCOUNTER — Ambulatory Visit (INDEPENDENT_AMBULATORY_CARE_PROVIDER_SITE_OTHER): Payer: Medicare PPO

## 2021-11-30 ENCOUNTER — Other Ambulatory Visit (INDEPENDENT_AMBULATORY_CARE_PROVIDER_SITE_OTHER): Payer: Medicare PPO

## 2021-11-30 ENCOUNTER — Ambulatory Visit: Payer: Medicare PPO | Admitting: Family Medicine

## 2021-11-30 VITALS — BP 132/78 | HR 52

## 2021-11-30 DIAGNOSIS — R112 Nausea with vomiting, unspecified: Secondary | ICD-10-CM | POA: Diagnosis not present

## 2021-11-30 DIAGNOSIS — E782 Mixed hyperlipidemia: Secondary | ICD-10-CM

## 2021-11-30 DIAGNOSIS — R197 Diarrhea, unspecified: Secondary | ICD-10-CM

## 2021-11-30 DIAGNOSIS — Z23 Encounter for immunization: Secondary | ICD-10-CM | POA: Diagnosis not present

## 2021-11-30 DIAGNOSIS — R519 Headache, unspecified: Secondary | ICD-10-CM

## 2021-11-30 LAB — COMPREHENSIVE METABOLIC PANEL
ALT: 5 U/L (ref 0–35)
AST: 14 U/L (ref 0–37)
Albumin: 4.1 g/dL (ref 3.5–5.2)
Alkaline Phosphatase: 116 U/L (ref 39–117)
BUN: 18 mg/dL (ref 6–23)
CO2: 27 mEq/L (ref 19–32)
Calcium: 9.4 mg/dL (ref 8.4–10.5)
Chloride: 106 mEq/L (ref 96–112)
Creatinine, Ser: 1.04 mg/dL (ref 0.40–1.20)
GFR: 53.19 mL/min — ABNORMAL LOW (ref >60.00)
Glucose, Bld: 83 mg/dL (ref 70–99)
Potassium: 4.2 mEq/L (ref 3.5–5.1)
Sodium: 142 mEq/L (ref 135–145)
Total Bilirubin: 0.7 mg/dL (ref 0.2–1.2)
Total Protein: 6.8 g/dL (ref 6.0–8.3)

## 2021-11-30 LAB — CBC WITH DIFFERENTIAL/PLATELET
Basophils Absolute: 0 10*3/uL (ref 0.0–0.1)
Basophils Relative: 0.9 % (ref 0.0–3.0)
Eosinophils Absolute: 0.1 10*3/uL (ref 0.0–0.7)
Eosinophils Relative: 3.2 % (ref 0.0–5.0)
HCT: 41.5 % (ref 36.0–46.0)
Hemoglobin: 13.3 g/dL (ref 12.0–15.0)
Lymphocytes Relative: 45.6 % (ref 12.0–46.0)
Lymphs Abs: 1.6 10*3/uL (ref 0.7–4.0)
MCHC: 32 g/dL (ref 30.0–36.0)
MCV: 95 fl (ref 78.0–100.0)
Monocytes Absolute: 0.3 10*3/uL (ref 0.1–1.0)
Monocytes Relative: 7.7 % (ref 3.0–12.0)
Neutro Abs: 1.5 10*3/uL (ref 1.4–7.7)
Neutrophils Relative %: 42.6 % — ABNORMAL LOW (ref 43.0–77.0)
Platelets: 111 10*3/uL — ABNORMAL LOW (ref 150.0–400.0)
RBC: 4.37 Mil/uL (ref 3.87–5.11)
RDW: 15.1 % (ref 11.5–15.5)
WBC: 3.6 10*3/uL — ABNORMAL LOW (ref 4.0–10.5)

## 2021-11-30 LAB — LIPID PANEL
Cholesterol: 159 mg/dL (ref 0–200)
HDL: 85.1 mg/dL (ref >39.00)
LDL Cholesterol: 59 mg/dL (ref 0–99)
NonHDL: 74.25
Total CHOL/HDL Ratio: 2
Triglycerides: 78 mg/dL (ref 0.0–149.0)
VLDL: 15.6 mg/dL (ref 0.0–40.0)

## 2021-11-30 MED ORDER — SUMATRIPTAN SUCCINATE 50 MG PO TABS: ORAL_TABLET | ORAL | 1 refills | Status: DC

## 2021-11-30 NOTE — Progress Notes (Signed)
Pt here for Blood pressure check per verbal order from PCP.   Pt currently takes: losartan 50 mg daily.   Pt reports compliance with medication.  BP today @ =132/78 HR =52  Pt advised per PCP to continue medication as prescribed.   BP reported at home this morning: 140/91 and 130/84.  Alysia Penna

## 2021-12-04 ENCOUNTER — Other Ambulatory Visit: Payer: Self-pay | Admitting: Family Medicine

## 2021-12-04 DIAGNOSIS — D691 Qualitative platelet defects: Secondary | ICD-10-CM

## 2021-12-07 ENCOUNTER — Other Ambulatory Visit: Payer: Self-pay | Admitting: Family Medicine

## 2021-12-07 DIAGNOSIS — F988 Other specified behavioral and emotional disorders with onset usually occurring in childhood and adolescence: Secondary | ICD-10-CM

## 2021-12-07 NOTE — Telephone Encounter (Signed)
Requesting: Adderall XR 20mg   Contract: 11/12/2021 UDS: 11/12/2021 Last Visit: 11/12/2021 Next Visit: 12/21/2021 Last Refill: 11/06/2021 #60 and 0RF  Please Advise

## 2021-12-10 DIAGNOSIS — M542 Cervicalgia: Secondary | ICD-10-CM | POA: Diagnosis not present

## 2021-12-10 DIAGNOSIS — M5106 Intervertebral disc disorders with myelopathy, lumbar region: Secondary | ICD-10-CM | POA: Diagnosis not present

## 2021-12-10 DIAGNOSIS — G894 Chronic pain syndrome: Secondary | ICD-10-CM | POA: Diagnosis not present

## 2021-12-10 DIAGNOSIS — Z79899 Other long term (current) drug therapy: Secondary | ICD-10-CM | POA: Diagnosis not present

## 2021-12-15 ENCOUNTER — Other Ambulatory Visit: Payer: Self-pay

## 2021-12-21 ENCOUNTER — Other Ambulatory Visit: Payer: Medicare PPO

## 2021-12-21 ENCOUNTER — Ambulatory Visit: Payer: Medicare PPO | Admitting: Family Medicine

## 2021-12-25 ENCOUNTER — Other Ambulatory Visit: Payer: Medicare PPO

## 2022-01-01 ENCOUNTER — Other Ambulatory Visit: Payer: Medicare PPO

## 2022-01-07 ENCOUNTER — Other Ambulatory Visit: Payer: Self-pay | Admitting: Family Medicine

## 2022-01-07 ENCOUNTER — Other Ambulatory Visit: Payer: Medicare PPO

## 2022-01-07 DIAGNOSIS — F988 Other specified behavioral and emotional disorders with onset usually occurring in childhood and adolescence: Secondary | ICD-10-CM

## 2022-01-07 DIAGNOSIS — F419 Anxiety disorder, unspecified: Secondary | ICD-10-CM

## 2022-01-07 NOTE — Telephone Encounter (Signed)
Requesting: Klonopin ?Contract: ?UDS: ?Last OV: ?Next OV: ?Last Refill: 11/12/2021, #60--0 RF ?Database: ? ? ?Please advise  ? ?

## 2022-01-07 NOTE — Telephone Encounter (Signed)
Requesting: Adderall XR  ?Contract: 11/12/2021 ?UDS: 11/12/21 ?Last OV: 11/12/2021 ?Next OV: N/A ?Last Refill: 12/07/2021, #60--0 RF ?Database: ? ? ?Please advise  ? ?

## 2022-01-11 ENCOUNTER — Other Ambulatory Visit: Payer: Self-pay | Admitting: Family Medicine

## 2022-01-11 ENCOUNTER — Telehealth: Payer: Self-pay

## 2022-01-11 DIAGNOSIS — F988 Other specified behavioral and emotional disorders with onset usually occurring in childhood and adolescence: Secondary | ICD-10-CM

## 2022-01-11 NOTE — Telephone Encounter (Signed)
Nurse Assessment ?Nurse: Glean Salvo, RN, [place] Date/Time (Eastern Time): 01/09/2022 9:27:41 AM ?Confirm and document reason for call. If ?symptomatic, describe symptoms. ?---Caller says that her Pharmacist faxed a request ?for her refill yesterday, but has not had a response. ?Adderall. States she goes through this every month. ?States she has a cold. T 99. Tylenol and Alka-Seltzer. ?Coughing Mucus. Has been sick since last week. ?Does the patient have any new or worsening ?symptoms? ---Yes ?Will a triage be completed? ---Yes ?Related visit to physician within the last 2 weeks? ---No ?Does the PT have any chronic conditions? (i.e. ?diabetes, asthma, this includes High risk factors for ?pregnancy, etc.) ?---Yes ?List chronic conditions. ---ADHD, Scoliosis ?Is this a behavioral health or substance abuse call? ---No ?Guidelines ?Guideline Title Affirmed Question Affirmed Notes Nurse Date/Time (Eastern ?Time) ?COVID-19 - ?Diagnosed or ?Suspected ?[1] HIGH RISK ?for severe COVID ?complications (e.g., ?weak immune ?system, age > 84 ?years, obesity with ?BMI 30 or higher, ?pregnant, chronic ?Walker-Foster, Therapist, sports, ?[place] ?01/09/2022 9:33:39 AM ?PLEASE NOTE: All timestamps contained within this report are represented as [place] Standard Time. ?CONFIDENTIALTY NOTICE: This fax transmission is intended only for the addressee. It contains information that is legally privileged, confidential or ?otherwise protected from use or disclosure. If you are not the intended recipient, you are strictly prohibited from reviewing, disclosing, copying using ?or disseminating any of this information or taking any action in reliance on or regarding this information. If you have received this fax in error, please ?notify us immediately by telephone so that we can arrange for its return to [place]. Phone: [phone], Toll-Free: [phone], Fax: [phone] ?Page: 2 of 2 ?Call Id: [id number] ?Guidelines ?Guideline Title Affirmed Question Affirmed Notes  Nurse Date/Time (Eastern ?Time) ?lung disease or other ?chronic medical ?condition) AND [2] ?COVID symptoms ?(e.g., cough, fever) ?(Exceptions: Already ?seen by PCP and no ?new or worsening ?symptoms.) ?Disp. Time (Eastern ?Time) Disposition Final User ?01/09/2022 9:37:08 AM Call PCP within 24 Hours Yes Walker-Foster, RN, [place] ?Caller Disagree/Comply Comply ?Caller Understands Yes ?PreDisposition Call Doctor ?Care Advice Given Per Guideline ?CALL PCP WITHIN 24 HOURS: COUGH SYRUP WITH DEXTROMETHORPHAN: * Cough syrups containing the cough ?suppressant dextromethorphan may help decrease your cough. CALL BACK IF: * You become worse CARE ADVICE given per ?COVID-19 - DIAGNOSED OR SUSPECTED (Adult) guideline ?

## 2022-01-11 NOTE — Telephone Encounter (Signed)
Refill sent in on 01/07/22 ?

## 2022-01-11 NOTE — Telephone Encounter (Signed)
Rx refilled on 01/07/2022. Can you deny script?  ?

## 2022-01-14 DIAGNOSIS — G894 Chronic pain syndrome: Secondary | ICD-10-CM | POA: Diagnosis not present

## 2022-01-14 DIAGNOSIS — M5106 Intervertebral disc disorders with myelopathy, lumbar region: Secondary | ICD-10-CM | POA: Diagnosis not present

## 2022-01-14 DIAGNOSIS — M542 Cervicalgia: Secondary | ICD-10-CM | POA: Diagnosis not present

## 2022-01-15 ENCOUNTER — Other Ambulatory Visit: Payer: Medicare PPO

## 2022-01-15 ENCOUNTER — Other Ambulatory Visit (INDEPENDENT_AMBULATORY_CARE_PROVIDER_SITE_OTHER): Payer: Medicare PPO

## 2022-01-15 DIAGNOSIS — D691 Qualitative platelet defects: Secondary | ICD-10-CM

## 2022-01-15 LAB — CBC WITH DIFFERENTIAL/PLATELET
Basophils Absolute: 0 10*3/uL (ref 0.0–0.1)
Basophils Relative: 0.7 % (ref 0.0–3.0)
Eosinophils Absolute: 0.1 10*3/uL (ref 0.0–0.7)
Eosinophils Relative: 2.3 % (ref 0.0–5.0)
HCT: 41.7 % (ref 36.0–46.0)
Hemoglobin: 13.6 g/dL (ref 12.0–15.0)
Lymphocytes Relative: 32.4 % (ref 12.0–46.0)
Lymphs Abs: 1.7 10*3/uL (ref 0.7–4.0)
MCHC: 32.6 g/dL (ref 30.0–36.0)
MCV: 97.4 fl (ref 78.0–100.0)
Monocytes Absolute: 0.4 10*3/uL (ref 0.1–1.0)
Monocytes Relative: 8.2 % (ref 3.0–12.0)
Neutro Abs: 3 10*3/uL (ref 1.4–7.7)
Neutrophils Relative %: 56.4 % (ref 43.0–77.0)
Platelets: 222 10*3/uL (ref 150.0–400.0)
RBC: 4.28 Mil/uL (ref 3.87–5.11)
RDW: 14.7 % (ref 11.5–15.5)
WBC: 5.2 10*3/uL (ref 4.0–10.5)

## 2022-02-01 ENCOUNTER — Encounter (HOSPITAL_BASED_OUTPATIENT_CLINIC_OR_DEPARTMENT_OTHER): Payer: Self-pay | Admitting: Emergency Medicine

## 2022-02-01 ENCOUNTER — Other Ambulatory Visit: Payer: Self-pay

## 2022-02-01 ENCOUNTER — Emergency Department (HOSPITAL_BASED_OUTPATIENT_CLINIC_OR_DEPARTMENT_OTHER): Payer: Medicare PPO

## 2022-02-01 ENCOUNTER — Telehealth: Payer: Self-pay

## 2022-02-01 ENCOUNTER — Emergency Department (HOSPITAL_BASED_OUTPATIENT_CLINIC_OR_DEPARTMENT_OTHER)
Admission: EM | Admit: 2022-02-01 | Discharge: 2022-02-01 | Disposition: A | Discharge status: Home / Self Care | Payer: Medicare PPO | Attending: Emergency Medicine | Admitting: Emergency Medicine

## 2022-02-01 DIAGNOSIS — R112 Nausea with vomiting, unspecified: Secondary | ICD-10-CM | POA: Diagnosis not present

## 2022-02-01 DIAGNOSIS — K760 Fatty (change of) liver, not elsewhere classified: Secondary | ICD-10-CM | POA: Diagnosis not present

## 2022-02-01 DIAGNOSIS — K529 Noninfective gastroenteritis and colitis, unspecified: Secondary | ICD-10-CM

## 2022-02-01 DIAGNOSIS — N3289 Other specified disorders of bladder: Secondary | ICD-10-CM | POA: Diagnosis not present

## 2022-02-01 DIAGNOSIS — R1110 Vomiting, unspecified: Secondary | ICD-10-CM | POA: Diagnosis not present

## 2022-02-01 DIAGNOSIS — I10 Essential (primary) hypertension: Secondary | ICD-10-CM | POA: Diagnosis not present

## 2022-02-01 DIAGNOSIS — R5381 Other malaise: Secondary | ICD-10-CM | POA: Diagnosis not present

## 2022-02-01 DIAGNOSIS — Z79899 Other long term (current) drug therapy: Secondary | ICD-10-CM | POA: Diagnosis not present

## 2022-02-01 DIAGNOSIS — R059 Cough, unspecified: Secondary | ICD-10-CM | POA: Diagnosis not present

## 2022-02-01 DIAGNOSIS — R531 Weakness: Secondary | ICD-10-CM | POA: Diagnosis not present

## 2022-02-01 DIAGNOSIS — R5383 Other fatigue: Secondary | ICD-10-CM | POA: Insufficient documentation

## 2022-02-01 DIAGNOSIS — R197 Diarrhea, unspecified: Secondary | ICD-10-CM | POA: Diagnosis not present

## 2022-02-01 DIAGNOSIS — Z20822 Contact with and (suspected) exposure to covid-19: Secondary | ICD-10-CM | POA: Insufficient documentation

## 2022-02-01 LAB — COMPREHENSIVE METABOLIC PANEL
ALT: 8 U/L (ref 0–44)
AST: 16 U/L (ref 15–41)
Albumin: 4.2 g/dL (ref 3.5–5.0)
Alkaline Phosphatase: 110 U/L (ref 38–126)
Anion gap: 9 (ref 5–15)
BUN: 9 mg/dL (ref 8–23)
CO2: 27 mmol/L (ref 22–32)
Calcium: 9.6 mg/dL (ref 8.9–10.3)
Chloride: 99 mmol/L (ref 98–111)
Creatinine, Ser: 0.94 mg/dL (ref 0.44–1.00)
GFR, Estimated: >60 mL/min (ref >60)
Glucose, Bld: 97 mg/dL (ref 70–99)
Potassium: 3.5 mmol/L (ref 3.5–5.1)
Sodium: 135 mmol/L (ref 135–145)
Total Bilirubin: 0.8 mg/dL (ref 0.3–1.2)
Total Protein: 7.2 g/dL (ref 6.5–8.1)

## 2022-02-01 LAB — CBC WITH DIFFERENTIAL/PLATELET
Abs Immature Granulocytes: 0.02 10*3/uL (ref 0.00–0.07)
Basophils Absolute: 0 10*3/uL (ref 0.0–0.1)
Basophils Relative: 1 %
Eosinophils Absolute: 0 10*3/uL (ref 0.0–0.5)
Eosinophils Relative: 1 %
HCT: 45.8 % (ref 36.0–46.0)
Hemoglobin: 15.3 g/dL — ABNORMAL HIGH (ref 12.0–15.0)
Immature Granulocytes: 0 %
Lymphocytes Relative: 28 %
Lymphs Abs: 1.4 10*3/uL (ref 0.7–4.0)
MCH: 31.9 pg (ref 26.0–34.0)
MCHC: 33.4 g/dL (ref 30.0–36.0)
MCV: 95.6 fL (ref 80.0–100.0)
Monocytes Absolute: 0.4 10*3/uL (ref 0.1–1.0)
Monocytes Relative: 8 %
Neutro Abs: 3.2 10*3/uL (ref 1.7–7.7)
Neutrophils Relative %: 62 %
Platelets: 166 10*3/uL (ref 150–400)
RBC: 4.79 MIL/uL (ref 3.87–5.11)
RDW: 13.8 % (ref 11.5–15.5)
WBC: 5.1 10*3/uL (ref 4.0–10.5)
nRBC: 0 % (ref 0.0–0.2)

## 2022-02-01 LAB — URINALYSIS, ROUTINE W REFLEX MICROSCOPIC
Bilirubin Urine: NEGATIVE
Glucose, UA: NEGATIVE mg/dL
Hgb urine dipstick: NEGATIVE
Ketones, ur: NEGATIVE mg/dL
Leukocytes,Ua: NEGATIVE
Nitrite: NEGATIVE
Protein, ur: NEGATIVE mg/dL
Specific Gravity, Urine: 1.02 (ref 1.005–1.030)
pH: 7 (ref 5.0–8.0)

## 2022-02-01 LAB — RESP PANEL BY RT-PCR (FLU A&B, COVID) ARPGX2
Influenza A by PCR: NEGATIVE
Influenza B by PCR: NEGATIVE
SARS Coronavirus 2 by RT PCR: NEGATIVE

## 2022-02-01 LAB — LIPASE, BLOOD: Lipase: 43 U/L (ref 11–51)

## 2022-02-01 MED ORDER — SODIUM CHLORIDE 0.9 % IV BOLUS
1000.0000 mL | Freq: Once | INTRAVENOUS | Status: AC
  Administered 2022-02-01: 1000 mL via INTRAVENOUS

## 2022-02-01 MED ORDER — ONDANSETRON HCL 4 MG/2ML IJ SOLN
4.0000 mg | Freq: Once | INTRAMUSCULAR | Status: AC
  Administered 2022-02-01: 4 mg via INTRAVENOUS
  Filled 2022-02-01: qty 2

## 2022-02-01 MED ORDER — AMLODIPINE BESYLATE 5 MG PO TABS
5.0000 mg | ORAL_TABLET | Freq: Once | ORAL | Status: AC
  Administered 2022-02-01: 5 mg via ORAL
  Filled 2022-02-01: qty 1

## 2022-02-01 MED ORDER — LOSARTAN POTASSIUM 25 MG PO TABS
50.0000 mg | ORAL_TABLET | Freq: Every day | ORAL | Status: DC
  Administered 2022-02-01: 50 mg via ORAL
  Filled 2022-02-01: qty 2

## 2022-02-01 MED ORDER — IOHEXOL 300 MG/ML  SOLN
100.0000 mL | Freq: Once | INTRAMUSCULAR | Status: AC | PRN
  Administered 2022-02-01: 100 mL via INTRAVENOUS

## 2022-02-01 MED ORDER — PANTOPRAZOLE SODIUM 20 MG PO TBEC: 20.0000 mg | DELAYED_RELEASE_TABLET | Freq: Every day | ORAL | 0 refills | Status: DC

## 2022-02-01 MED ORDER — LOPERAMIDE HCL 2 MG PO CAPS: 2.0000 mg | ORAL_CAPSULE | Freq: Four times a day (QID) | ORAL | 0 refills | Status: DC | PRN

## 2022-02-01 MED ORDER — SODIUM CHLORIDE 0.9 % IV SOLN: INTRAVENOUS | Status: DC

## 2022-02-01 MED ORDER — ONDANSETRON 8 MG PO TBDP: 8.0000 mg | ORAL_TABLET | Freq: Three times a day (TID) | ORAL | 0 refills | Status: DC | PRN

## 2022-02-01 NOTE — Discharge Instructions (Addendum)
The CT scan did show an irregularity in your stomach.  Follow-up with a GI doctor to discuss further evaluation. ?Take the medications as needed for vomiting and diarrhea.  Also take the antacid medication as prescribed.  Make sure to take your blood pressure medications regularly.  Return to the ED as needed for worsening symptoms ?

## 2022-02-01 NOTE — Telephone Encounter (Signed)
Nurse Assessment ?Nurse: Ronnald Ramp, RN, Rollene Fare Date/Time (Eastern Time): 01/31/2022 2:36:33 PM ?Confirm and document reason for call. If ?symptomatic, describe symptoms. ?---Caller's wife has had diarrhea for 3-4 days, now also ?vomiting. Cannot keep anything down. Still urinating. ?No blood in vomit nor stool. Diarrhea now is mucus. ?Denies fever. ?Does the patient have any new or worsening ?symptoms? ---Yes ?Will a triage be completed? ---Yes ?Related visit to physician within the last 2 weeks? ---No ?Does the PT have any chronic conditions? (i.e. ?diabetes, asthma, this includes High risk factors for ?pregnancy, etc.) ?---Yes ?List chronic conditions. ---HTN ?Is this a behavioral health or substance abuse call? ---No ?Guidelines ?Guideline Title Affirmed Question Affirmed Notes Nurse Date/Time (Eastern ?Time) ?Vomiting [1] MODERATE ?vomiting (e.g., 3 - 5 ?times/day) AND [2] ?age > 62 years ?Ronnald Ramp, RN, [facility] 01/31/2022 2:37:50 ?PM ?Disp. Time (Eastern ?Time) Disposition Final User ?PLEASE NOTE: All timestamps contained within this report are represented as [place] Standard Time. ?CONFIDENTIALTY NOTICE: This fax transmission is intended only for the addressee. It contains information that is legally privileged, confidential or ?otherwise protected from use or disclosure. If you are not the intended recipient, you are strictly prohibited from reviewing, disclosing, copying using ?or disseminating any of this information or taking any action in reliance on or regarding this information. If you have received this fax in error, please ?notify us immediately by telephone so that we can arrange for its return to [place]. Phone: [phone], Toll-Free: [phone], Fax: [phone] ?Page: 2 of 2 ?Call Id: [id number] ?01/31/2022 2:41:29 PM Go to ED Now (or PCP triage) Yes Ronnald Ramp, RN, Rollene Fare ?Caller Disagree/Comply Comply ?Caller Understands Yes ?PreDisposition Call Doctor ?Care Advice Given Per Guideline ?GO TO ED NOW (OR PCP  TRIAGE): * IF NO PCP (PRIMARY CARE PROVIDER) SECOND-LEVEL TRIAGE: You need to be ?seen within the next hour. Go to the [place] at _[address] as soon as you can. BRING MEDICINES: * Please ?bring a list of your current medicines when you go to see the doctor. BRING A BUCKET IN CASE OF VOMITING: * You may ?wish to bring a bucket, pan, or plastic bag with you in case there is more vomiting during the drive. ?Referrals ?[facility] - ED ?

## 2022-02-01 NOTE — ED Triage Notes (Signed)
Pt reports diarrhea and vomiting that started 4 days ago. Pt stating "I haven't felt good in about 4 weeks." Denies fevers. ?

## 2022-02-01 NOTE — ED Provider Notes (Signed)
?[facility] EMERGENCY DEPARTMENT ?Provider Note ? ? ?CSN: [id number] ?Arrival date & time: 02/01/22  0857 ? ?  ? ?History ? ?Chief Complaint  ?Patient presents with  ? Diarrhea  ? ? ?[name] is a 76 y.o. female. ? ? ?Diarrhea ? ?Patient presents to the ED for evaluation of vomiting and diarrhea.  Patient states she started having symptoms about 4 days ago.  She has had numerous episodes of both vomiting and diarrhea.  She has not been able to quantify how many.  Patient states she has been weak and fatigued.  She has not been able to keep anything down other than small amount of Pepto-Bismol.  Patient denies any abdominal pain.  Is not sure if she has had any fevers. ? ?Family also states that they traveled to go to a funeral several weeks ago.  Several family members have had a viral type illness with coughing and congestion.  Patient had it a few weeks ago but has not ever completely recovered. ? ?Home Medications ?Prior to Admission medications   ?Medication Sig Start Date End Date Taking? Authorizing Provider  ?loperamide (IMODIUM) 2 MG capsule Take 1 capsule (2 mg total) by mouth 4 (four) times daily as needed for diarrhea or loose stools. 02/01/22  Yes Linwood Dibbles, MD  ?ondansetron (ZOFRAN-ODT) 8 MG disintegrating tablet Take 1 tablet (8 mg total) by mouth every 8 (eight) hours as needed for nausea or vomiting. 02/01/22  Yes Linwood Dibbles, MD  ?pantoprazole (PROTONIX) 20 MG tablet Take 1 tablet (20 mg total) by mouth daily. 02/01/22 03/03/22 Yes Linwood Dibbles, MD  ?amphetamine-dextroamphetamine (ADDERALL XR) 20 MG 24 hr capsule TAKE TWO CAPSULES BY MOUTH EACH DAY 01/11/22   Zola Button, Myrene Buddy R, DO  ?clonazePAM (KLONOPIN) 0.5 MG tablet TAKE ONE TABLET BY MOUTH TWICE A DAY AS NEEDED 01/07/22   Donato Schultz, DO  ?losartan (COZAAR) 50 MG tablet Take 1 tablet (50 mg total) by mouth daily. 11/23/21   Donato Schultz, DO  ?oxyCODONE (ROXICODONE) 15 MG immediate release tablet Take 1 tablet (15 mg  total) by mouth 4 (four) times daily. ?Patient taking differently: Take 15 mg by mouth 5 (five) times daily. 04/19/19   Dominica Severin, MD  ?SUMAtriptan (IMITREX) 50 MG tablet May repeat in 2 hours if headache persists or recurs. 11/30/21   Donato Schultz, DO  ?venlafaxine XR (EFFEXOR-XR) 75 MG 24 hr capsule Take 2 capsules (150 mg total) by mouth daily. 11/12/21   Donato Schultz, DO  ?   ? ?Allergies    ?Zanaflex [tizanidine] and Adhesive [tape]   ? ?Review of Systems   ?Review of Systems  ?Gastrointestinal:  Positive for diarrhea.  ? ?Physical Exam ?Updated Vital Signs ?BP (!) 178/90   Pulse (!) 59   Temp (!) 97.5 ?F (36.4 ?C) (Oral)   Resp 20   SpO2 98%  ?Physical Exam ?Vitals and nursing note reviewed.  ?Constitutional:   ?   Appearance: She is not diaphoretic.  ?   Comments: Elderly, frail  ?HENT:  ?   Head: Normocephalic and atraumatic.  ?   Right Ear: External ear normal.  ?   Left Ear: External ear normal.  ?Eyes:  ?   General: No scleral icterus.    ?   Right eye: No discharge.     ?   Left eye: No discharge.  ?   Conjunctiva/sclera: Conjunctivae normal.  ?Neck:  ?   Trachea: No  tracheal deviation.  ?Cardiovascular:  ?   Rate and Rhythm: Normal rate and regular rhythm.  ?Pulmonary:  ?   Effort: Pulmonary effort is normal. No respiratory distress.  ?   Breath sounds: Normal breath sounds. No stridor. No wheezing or rales.  ?Abdominal:  ?   General: Bowel sounds are normal. There is no distension.  ?   Palpations: Abdomen is soft.  ?   Tenderness: There is no abdominal tenderness. There is no guarding or rebound.  ?Musculoskeletal:     ?   General: No tenderness or deformity.  ?   Cervical back: Neck supple.  ?Skin: ?   General: Skin is warm and dry.  ?   Findings: No rash.  ?Neurological:  ?   General: No focal deficit present.  ?   Mental Status: She is alert.  ?   Cranial Nerves: No cranial nerve deficit (no facial droop, extraocular movements intact, no slurred speech).  ?   Sensory: No  sensory deficit.  ?   Motor: No abnormal muscle tone or seizure activity.  ?   Coordination: Coordination normal.  ?Psychiatric:     ?   Mood and Affect: Mood normal.  ? ? ?ED Results / Procedures / Treatments   ?Labs ?(all labs ordered are listed, but only abnormal results are displayed) ?Labs Reviewed  ?CBC WITH DIFFERENTIAL/PLATELET - Abnormal; Notable for the following components:  ?    Result Value  ? Hemoglobin 15.3 (*)   ? All other components within normal limits  ?URINALYSIS, ROUTINE W REFLEX MICROSCOPIC - Abnormal; Notable for the following components:  ? Color, Urine STRAW (*)   ? All other components within normal limits  ?RESP PANEL BY RT-PCR (FLU A&B, COVID) [id number]  ?COMPREHENSIVE METABOLIC PANEL  ?LIPASE, BLOOD  ? ? ?EKG ?EKG Interpretation ? ?Date/Time:  Monday February 01 2022 09:12:29 EDT ?Ventricular Rate:  55 ?PR Interval:  143 ?QRS Duration: 78 ?QT Interval:  462 ?QTC Calculation: 442 ?R Axis:   16 ?Text Interpretation: Sinus rhythm Since last tracing rate slower Confirmed by Linwood DibblesKnapp, Alisan Dokes [phone]([id number]) on 02/01/2022 9:18:13 AM ? ?Radiology ?CT ABDOMEN PELVIS W CONTRAST ? ?Result Date: 02/01/2022 ?CLINICAL DATA:  Nausea vomiting with for 4 days with associated diarrhea. EXAM: CT ABDOMEN AND PELVIS WITH CONTRAST TECHNIQUE: Multidetector CT imaging of the abdomen and pelvis was performed using the standard protocol following bolus administration of intravenous contrast. RADIATION DOSE REDUCTION: This exam was performed according to the departmental dose-optimization program which includes automated exposure control, adjustment of the mA and/or kV according to patient size and/or use of iterative reconstruction technique. CONTRAST:  100mL OMNIPAQUE IOHEXOL 300 MG/ML  SOLN COMPARISON:  Comparison with March 06, 2021. FINDINGS: Lower chest: Scarring in the RIGHT middle lobe. Pulmonary emphysema. No effusion, no consolidation or substantial change from prior imaging. Hepatobiliary: Hepatic steatosis. No focal,  suspicious hepatic lesion. Pancreas: Normal, without mass, inflammation or ductal dilatation. Spleen: Normal. Adrenals/Urinary Tract: Adrenal glands are normal. Post LEFT nephrectomy as on prior studies. RIGHT kidney with normal enhancement. Mild fullness of an extrarenal pelvis but without hydronephrosis. Urinary bladder is mildly distended no wall thickening or perivesical stranding. Stomach/Bowel: Bowel loops fill the LEFT renal fossa following LEFT nephrectomy. Jejunal-jejunal anastomosis with variable position on multiple prior studies, in the LEFT hemiabdomen on the current exam previously in the mid abdomen on the most recent prior but in the LEFT hemiabdomen on prior studies dating back to 2019. Mild bowel edema throughout the abdomen seen to varying  degrees on previous imaging. Cystic dilation of the proximal excluded gastric fundus (image 12/2) 3 x 2.9 cm. Vascular/Lymphatic: Aortic atherosclerosis. No sign of aneurysm. Smooth contour of the IVC. There is no gastrohepatic or hepatoduodenal ligament lymphadenopathy. No retroperitoneal or mesenteric lymphadenopathy. No pelvic sidewall lymphadenopathy. Reproductive: Post hysterectomy without adnexal mass. Other: No ascites.  No pneumoperitoneum. Musculoskeletal: Osteopenia. Post RIGHT femoral ORIF, partially visualized. Multilevel compression fractures of the spine, signs of cement augmentation at L1 with similar appearance. IMPRESSION: 1. Cystic dilation of the gastric fundus following gastric bypass. This is excluded stomach in could reflect small gastrogastric fistula or could be related to underlying chronic gastritis or ulceration with narrowing of the excluded stomach just beyond this location. This could also be peristaltic but the degree of distension in this area is unusual. 2. Mild generalized edema of bowel without signs of obstruction, seen to varying degrees on previous imaging could be related to mild enteritis. 3. Post LEFT nephrectomy, bowel  filling the LEFT hemiabdomen with mild generalized mesenteric edema that has been present to varying degrees on previous imaging evaluations. 4. Appendix not visualized, no secondary signs to suggest acute appendicit

## 2022-02-01 NOTE — ED Notes (Signed)
Patient transported to CT 

## 2022-02-01 NOTE — Telephone Encounter (Signed)
Pt seen in ED 

## 2022-02-02 ENCOUNTER — Emergency Department (HOSPITAL_BASED_OUTPATIENT_CLINIC_OR_DEPARTMENT_OTHER)
Admission: EM | Admit: 2022-02-02 | Discharge: 2022-02-02 | Disposition: A | Discharge status: Home / Self Care | Payer: Medicare PPO | Attending: Emergency Medicine | Admitting: Emergency Medicine

## 2022-02-02 ENCOUNTER — Encounter (HOSPITAL_BASED_OUTPATIENT_CLINIC_OR_DEPARTMENT_OTHER): Payer: Self-pay | Admitting: Urology

## 2022-02-02 ENCOUNTER — Other Ambulatory Visit: Payer: Self-pay

## 2022-02-02 DIAGNOSIS — R112 Nausea with vomiting, unspecified: Secondary | ICD-10-CM | POA: Insufficient documentation

## 2022-02-02 DIAGNOSIS — I10 Essential (primary) hypertension: Secondary | ICD-10-CM | POA: Insufficient documentation

## 2022-02-02 MED ORDER — PROMETHAZINE HCL 25 MG RE SUPP: 25.0000 mg | Freq: Four times a day (QID) | RECTAL | 0 refills | Status: DC | PRN

## 2022-02-02 MED ORDER — SODIUM CHLORIDE 0.9 % IV BOLUS
1000.0000 mL | Freq: Once | INTRAVENOUS | Status: AC
  Administered 2022-02-02: 1000 mL via INTRAVENOUS

## 2022-02-02 MED ORDER — PROCHLORPERAZINE EDISYLATE 10 MG/2ML IJ SOLN
10.0000 mg | Freq: Once | INTRAMUSCULAR | Status: AC
  Administered 2022-02-02: 10 mg via INTRAVENOUS
  Filled 2022-02-02: qty 2

## 2022-02-02 NOTE — Discharge Instructions (Addendum)
You were seen for nausea and vomiting.  I have prescribed a Phenergan suppository to help with your symptoms.  Continue to hydrate carefully with small sips of water until you can tolerate more.  Keep your GI appointment for this Thursday.  Return to the hospital for life-threatening conditions. ?

## 2022-02-02 NOTE — ED Triage Notes (Signed)
Report return of Nausea and vomiting this am ?States was seen yesterday  ?Cant get in with GI until Thursday  ?States Zofran rx isnt helping  ?Able to tolerate water only  ?

## 2022-02-02 NOTE — ED Provider Notes (Signed)
?[facility] EMERGENCY DEPARTMENT ?Provider Note ? ? ?CSN: [id number] ?Arrival date & time: 02/02/22  1533 ? ?  ? ?History ? ?Chief Complaint  ?Patient presents with  ? Emesis  ? ? ?[name] is a 76 y.o. female. Patient presents with nausea and vomiting. She states that she vomits if she tries to eat or drink anything other than small sips of water. The patient was evaluated in the emergency department yesterday and was diagnosed with gastroenteritis. The patient was discharged home with Imodium, Zofran, and protonix. The patient was able to schedule a GI appointment for this Thursday. No new symptoms at this time. PMH significant for hypertension, history of pneumonia, status post gastric bypass, hyperlipidemia, renal insufficiency, dyspnea on exertion, fibromyalgia, pulmonary nodules, migraines, chronic back pain, anxiety ? ?HPI ? ?  ? ?Home Medications ?Prior to Admission medications   ?Medication Sig Start Date End Date Taking? Authorizing Provider  ?promethazine (PHENERGAN) 25 MG suppository Place 1 suppository (25 mg total) rectally every 6 (six) hours as needed for nausea or vomiting. 02/02/22  Yes Barrie Dunker B, PA-C  ?amphetamine-dextroamphetamine (ADDERALL XR) 20 MG 24 hr capsule TAKE TWO CAPSULES BY MOUTH EACH DAY 01/11/22   Zola Button, Myrene Buddy R, DO  ?clonazePAM (KLONOPIN) 0.5 MG tablet TAKE ONE TABLET BY MOUTH TWICE A DAY AS NEEDED 01/07/22   Donato Schultz, DO  ?loperamide (IMODIUM) 2 MG capsule Take 1 capsule (2 mg total) by mouth 4 (four) times daily as needed for diarrhea or loose stools. 02/01/22   Linwood Dibbles, MD  ?losartan (COZAAR) 50 MG tablet Take 1 tablet (50 mg total) by mouth daily. 11/23/21   Donato Schultz, DO  ?ondansetron (ZOFRAN-ODT) 8 MG disintegrating tablet Take 1 tablet (8 mg total) by mouth every 8 (eight) hours as needed for nausea or vomiting. 02/01/22   Linwood Dibbles, MD  ?oxyCODONE (ROXICODONE) 15 MG immediate release tablet Take 1 tablet (15 mg total) by  mouth 4 (four) times daily. ?Patient taking differently: Take 15 mg by mouth 5 (five) times daily. 04/19/19   Dominica Severin, MD  ?pantoprazole (PROTONIX) 20 MG tablet Take 1 tablet (20 mg total) by mouth daily. 02/01/22 03/03/22  Linwood Dibbles, MD  ?SUMAtriptan (IMITREX) 50 MG tablet May repeat in 2 hours if headache persists or recurs. 11/30/21   Donato Schultz, DO  ?venlafaxine XR (EFFEXOR-XR) 75 MG 24 hr capsule Take 2 capsules (150 mg total) by mouth daily. 11/12/21   Donato Schultz, DO  ?   ? ?Allergies    ?Zanaflex [tizanidine] and Adhesive [tape]   ? ?Review of Systems   ?Review of Systems  ?Respiratory:  Negative for shortness of breath.   ?Cardiovascular:  Negative for chest pain.  ?Gastrointestinal:  Positive for nausea and vomiting. Negative for abdominal pain and diarrhea.  ?Neurological:  Negative for weakness.  ? ?Physical Exam ?Updated Vital Signs ?BP (!) 182/89   Pulse 86   Temp 98.4 ?F (36.9 ?C)   Resp 20   Ht 5\' 5"  (1.651 m)   Wt 62.7 kg   SpO2 93%   BMI 23.00 kg/m?  ?Physical Exam ?Constitutional:   ?   General: She is not in acute distress. ?HENT:  ?   Head: Normocephalic.  ?Eyes:  ?   Conjunctiva/sclera: Conjunctivae normal.  ?Cardiovascular:  ?   Rate and Rhythm: Normal rate and regular rhythm.  ?   Pulses: Normal pulses.  ?Pulmonary:  ?   Effort: Pulmonary  effort is normal. No respiratory distress.  ?   Breath sounds: Normal breath sounds.  ?Abdominal:  ?   Palpations: Abdomen is soft.  ?   Tenderness: There is no abdominal tenderness.  ?Musculoskeletal:  ?   Cervical back: Normal range of motion.  ?Skin: ?   General: Skin is warm and dry.  ?Neurological:  ?   Mental Status: She is alert and oriented to person, place, and time.  ? ? ?ED Results / Procedures / Treatments   ?Labs ?(all labs ordered are listed, but only abnormal results are displayed) ?Labs Reviewed - No data to display ? ?EKG ?None ? ?Radiology ?CT ABDOMEN PELVIS W CONTRAST ? ?Result Date: 02/01/2022 ?CLINICAL DATA:   Nausea vomiting with for 4 days with associated diarrhea. EXAM: CT ABDOMEN AND PELVIS WITH CONTRAST TECHNIQUE: Multidetector CT imaging of the abdomen and pelvis was performed using the standard protocol following bolus administration of intravenous contrast. RADIATION DOSE REDUCTION: This exam was performed according to the departmental dose-optimization program which includes automated exposure control, adjustment of the mA and/or kV according to patient size and/or use of iterative reconstruction technique. CONTRAST:  OMNIPAQUE IOHEXOL 300 MG/ML  SOLN COMPARISON:  Comparison with March 06, 2021. FINDINGS: Lower chest: Scarring in the RIGHT middle lobe. Pulmonary emphysema. No effusion, no consolidation or substantial change from prior imaging. Hepatobiliary: Hepatic steatosis. No focal, suspicious hepatic lesion. Pancreas: Normal, without mass, inflammation or ductal dilatation. Spleen: Normal. Adrenals/Urinary Tract: Adrenal glands are normal. Post LEFT nephrectomy as on prior studies. RIGHT kidney with normal enhancement. Mild fullness of an extrarenal pelvis but without hydronephrosis. Urinary bladder is mildly distended no wall thickening or perivesical stranding. Stomach/Bowel: Bowel loops fill the LEFT renal fossa following LEFT nephrectomy. Jejunal-jejunal anastomosis with variable position on multiple prior studies, in the LEFT hemiabdomen on the current exam previously in the mid abdomen on the most recent prior but in the LEFT hemiabdomen on prior studies dating back to 2019. Mild bowel edema throughout the abdomen seen to varying degrees on previous imaging. Cystic dilation of the proximal excluded gastric fundus (image 12/2) 3 x 2.9 cm. Vascular/Lymphatic: Aortic atherosclerosis. No sign of aneurysm. Smooth contour of the IVC. There is no gastrohepatic or hepatoduodenal ligament lymphadenopathy. No retroperitoneal or mesenteric lymphadenopathy. No pelvic sidewall lymphadenopathy. Reproductive:  Post hysterectomy without adnexal mass. Other: No ascites.  No pneumoperitoneum. Musculoskeletal: Osteopenia. Post RIGHT femoral ORIF, partially visualized. Multilevel compression fractures of the spine, signs of cement augmentation at L1 with similar appearance. IMPRESSION: 1. Cystic dilation of the gastric fundus following gastric bypass. This is excluded stomach in could reflect small gastrogastric fistula or could be related to underlying chronic gastritis or ulceration with narrowing of the excluded stomach just beyond this location. This could also be peristaltic but the degree of distension in this area is unusual. 2. Mild generalized edema of bowel without signs of obstruction, seen to varying degrees on previous imaging could be related to mild enteritis. 3. Post LEFT nephrectomy, bowel filling the LEFT hemiabdomen with mild generalized mesenteric edema that has been present to varying degrees on previous imaging evaluations. 4. Appendix not visualized, no secondary signs to suggest acute appendicitis. Aortic Atherosclerosis (ICD10-I70.0) and Emphysema (ICD10-J43.9). Electronically Signed   By: Donzetta Kohut M.D.   On: 02/01/2022 12:43  ? ?DG Chest Portable 1 View ? ?Result Date: 02/01/2022 ?CLINICAL DATA:  Provided history: Cough.  Vomiting, diarrhea. EXAM: PORTABLE CHEST 1 VIEW COMPARISON:  Prior chest radiographs 11/12/2021 and earlier. FINDINGS: Heart  size within normal limits. Aortic atherosclerosis. Redemonstrated chronic elevation of the right hemidiaphragm. Opacity within the medial right lung base, slightly more conspicuous as compared to the prior examination of 11/12/2021. No appreciable airspace consolidation within the left lung. No evidence of pleural effusion or pneumothorax. No acute bony abnormality identified. IMPRESSION: Opacity within the medial right lung base, slightly more conspicuous as compared to the prior examination of 11/12/2021. This may reflect compressive atelectasis in the  setting of chronic right hemidiaphragm elevation. However, pneumonia at this site is difficult to definitively exclude. Aortic Atherosclerosis (ICD10-I70.0). Electronically Signed   By: Jackey LogeKyle  Golden D.O.   On:

## 2022-02-04 ENCOUNTER — Ambulatory Visit: Payer: Medicare PPO | Admitting: Gastroenterology

## 2022-02-04 ENCOUNTER — Telehealth: Payer: Self-pay | Admitting: Family Medicine

## 2022-02-04 NOTE — Telephone Encounter (Signed)
Spoke with patient and she stated that the 50mg  that she started on in January was not getting her bp down so she increased in Feb to 2 tablets a day.  She stated that she saw a change in her bp in about 2-3 days.  She has been doing well on this dose and did not check her bp today.  She has no complaints of dizziness, palpitations, or low bp.  Are you ok with her being on 100mg  of losartan? ?

## 2022-02-04 NOTE — Telephone Encounter (Signed)
Pt has been doubling up on medication, because of this she will be running out of rx sooner. She would like rx changed to 100mg  so it can be filled properly. Please advise.  ? ?Medication: losartan (COZAAR) 50 MG tablet ? ?Has the patient contacted their pharmacy? Yes.   ? ?Preferred Pharmacy: [facility] - [place], [place] - [address]  ?[address], [place]  ?Phone:  [phone]  Fax:  [phone]  ? ? ?

## 2022-02-05 ENCOUNTER — Other Ambulatory Visit: Payer: Self-pay | Admitting: Family Medicine

## 2022-02-05 ENCOUNTER — Other Ambulatory Visit: Payer: Self-pay

## 2022-02-05 DIAGNOSIS — F419 Anxiety disorder, unspecified: Secondary | ICD-10-CM

## 2022-02-05 MED ORDER — LOSARTAN POTASSIUM 100 MG PO TABS: 100.0000 mg | ORAL_TABLET | Freq: Every day | ORAL | 1 refills | Status: DC

## 2022-02-05 NOTE — Telephone Encounter (Signed)
Spoke with patient. Pt verbalized understanding and will not increase meds without calling first. Rx sent ?

## 2022-02-05 NOTE — Telephone Encounter (Signed)
Requesting: clonazepam 0.5mg   ?Contract: 11/12/21 ?UDS: 11/12/21 ?Last Visit: 11/12/21 ?Next Visit: None ?Last Refill: 01/07/22 #60 and 0RF ? ?Please Advise ? ?

## 2022-02-09 ENCOUNTER — Other Ambulatory Visit: Payer: Self-pay | Admitting: Family Medicine

## 2022-02-09 DIAGNOSIS — F988 Other specified behavioral and emotional disorders with onset usually occurring in childhood and adolescence: Secondary | ICD-10-CM

## 2022-02-09 NOTE — Telephone Encounter (Signed)
Requesting: Adderall XR 20mg   ?Contract: 11/12/21 ?UDS: 11/12/21 ?Last Visit: 11/12/21 ?Next Visit: None ?Last Refill: 01/11/22 #60 and 0RF ? ?Please Advise ? ?

## 2022-02-11 ENCOUNTER — Other Ambulatory Visit: Payer: Self-pay | Admitting: Family Medicine

## 2022-02-11 DIAGNOSIS — F988 Other specified behavioral and emotional disorders with onset usually occurring in childhood and adolescence: Secondary | ICD-10-CM

## 2022-02-16 ENCOUNTER — Telehealth: Payer: Self-pay | Admitting: Family Medicine

## 2022-02-16 DIAGNOSIS — F988 Other specified behavioral and emotional disorders with onset usually occurring in childhood and adolescence: Secondary | ICD-10-CM

## 2022-02-16 NOTE — Telephone Encounter (Signed)
Husband called [facility] is out of Adderall and they are not sure when they will be restocked. Husband asked if we can send it instead to the [facility] [address].  ?

## 2022-02-16 NOTE — Telephone Encounter (Signed)
Husband called [facility] is out of Adderall and they are not sure when they will be restocked. Husband asked if we can send it instead to the [facility] [address]. Please advise. ? ? ?

## 2022-02-17 ENCOUNTER — Other Ambulatory Visit: Payer: Self-pay | Admitting: Family Medicine

## 2022-02-17 DIAGNOSIS — F988 Other specified behavioral and emotional disorders with onset usually occurring in childhood and adolescence: Secondary | ICD-10-CM

## 2022-02-17 NOTE — Telephone Encounter (Signed)
[name] is out of the Adderall and would like it sent to [facility].  Phone message has been sent to you as well. ?

## 2022-02-18 ENCOUNTER — Other Ambulatory Visit: Payer: Self-pay | Admitting: Family Medicine

## 2022-02-18 DIAGNOSIS — M542 Cervicalgia: Secondary | ICD-10-CM | POA: Diagnosis not present

## 2022-02-18 DIAGNOSIS — G894 Chronic pain syndrome: Secondary | ICD-10-CM | POA: Diagnosis not present

## 2022-02-18 DIAGNOSIS — M5106 Intervertebral disc disorders with myelopathy, lumbar region: Secondary | ICD-10-CM | POA: Diagnosis not present

## 2022-02-18 DIAGNOSIS — F988 Other specified behavioral and emotional disorders with onset usually occurring in childhood and adolescence: Secondary | ICD-10-CM

## 2022-02-18 DIAGNOSIS — M40209 Unspecified kyphosis, site unspecified: Secondary | ICD-10-CM | POA: Diagnosis not present

## 2022-02-18 MED ORDER — AMPHETAMINE-DEXTROAMPHET ER 20 MG PO CP24: ORAL_CAPSULE | ORAL | 0 refills | Status: DC

## 2022-03-11 ENCOUNTER — Other Ambulatory Visit: Payer: Self-pay | Admitting: Family Medicine

## 2022-03-11 DIAGNOSIS — R519 Headache, unspecified: Secondary | ICD-10-CM

## 2022-03-18 ENCOUNTER — Other Ambulatory Visit: Payer: Self-pay | Admitting: Family Medicine

## 2022-03-18 DIAGNOSIS — M40209 Unspecified kyphosis, site unspecified: Secondary | ICD-10-CM | POA: Diagnosis not present

## 2022-03-18 DIAGNOSIS — M542 Cervicalgia: Secondary | ICD-10-CM | POA: Diagnosis not present

## 2022-03-18 DIAGNOSIS — Z79891 Long term (current) use of opiate analgesic: Secondary | ICD-10-CM | POA: Diagnosis not present

## 2022-03-18 DIAGNOSIS — F988 Other specified behavioral and emotional disorders with onset usually occurring in childhood and adolescence: Secondary | ICD-10-CM

## 2022-03-18 DIAGNOSIS — G894 Chronic pain syndrome: Secondary | ICD-10-CM | POA: Diagnosis not present

## 2022-03-18 DIAGNOSIS — Z79899 Other long term (current) drug therapy: Secondary | ICD-10-CM | POA: Diagnosis not present

## 2022-03-18 MED ORDER — AMPHETAMINE-DEXTROAMPHET ER 20 MG PO CP24: ORAL_CAPSULE | ORAL | 0 refills | Status: DC

## 2022-03-18 NOTE — Telephone Encounter (Signed)
Requesting: Adderall XR 20mg  ?Contract: 11/12/21 ?UDS: 11/12/21 ?Last Visit: 11/12/21 ?Next Visit: none ?Last Refill: 02/18/22 ? ?Please Advise ? ?

## 2022-03-18 NOTE — Telephone Encounter (Signed)
Requesting: Adderall XR 20mg   ?Contract: 11/12/21 ?UDS: 11/12/21 ?Last Visit: 11/12/21 ?Next Visit: None ?Last Refill: 02/18/22 #60 and 0RF ? ?Please Advise ? ?

## 2022-04-09 ENCOUNTER — Other Ambulatory Visit: Payer: Self-pay | Admitting: Family Medicine

## 2022-04-09 DIAGNOSIS — F419 Anxiety disorder, unspecified: Secondary | ICD-10-CM

## 2022-04-09 NOTE — Telephone Encounter (Signed)
Requesting:klonopin 0.5 mg Contract:11/12/21 UDS:11/12/21 Last Visit:11/12/21 Next Visit:unknown Last Refill:02/05/22  Please Advise

## 2022-04-12 DIAGNOSIS — H04563 Stenosis of bilateral lacrimal punctum: Secondary | ICD-10-CM | POA: Diagnosis not present

## 2022-04-12 DIAGNOSIS — H43813 Vitreous degeneration, bilateral: Secondary | ICD-10-CM | POA: Diagnosis not present

## 2022-04-12 DIAGNOSIS — H353131 Nonexudative age-related macular degeneration, bilateral, early dry stage: Secondary | ICD-10-CM | POA: Diagnosis not present

## 2022-04-12 DIAGNOSIS — H2513 Age-related nuclear cataract, bilateral: Secondary | ICD-10-CM | POA: Diagnosis not present

## 2022-04-15 DIAGNOSIS — M40209 Unspecified kyphosis, site unspecified: Secondary | ICD-10-CM | POA: Diagnosis not present

## 2022-04-15 DIAGNOSIS — M542 Cervicalgia: Secondary | ICD-10-CM | POA: Diagnosis not present

## 2022-04-15 DIAGNOSIS — G894 Chronic pain syndrome: Secondary | ICD-10-CM | POA: Diagnosis not present

## 2022-04-16 ENCOUNTER — Other Ambulatory Visit: Payer: Self-pay | Admitting: Family Medicine

## 2022-04-16 DIAGNOSIS — F988 Other specified behavioral and emotional disorders with onset usually occurring in childhood and adolescence: Secondary | ICD-10-CM

## 2022-04-16 MED ORDER — AMPHETAMINE-DEXTROAMPHET ER 20 MG PO CP24: ORAL_CAPSULE | ORAL | 0 refills | Status: DC

## 2022-04-16 NOTE — Telephone Encounter (Signed)
Requesting: Adderall XR Contract: 11/12/21 UDS: 11/12/2021 Last OV: 11/12/21 Next OV: N/A Last Refill: 03/18/2022, #60--0 RF Database:   Please advise

## 2022-04-16 NOTE — Telephone Encounter (Signed)
Medication: amphetamine-dextroamphetamine (ADDERALL XR) 20 MG 24 hr capsule  Has the patient contacted their pharmacy? Yes.    Preferred Pharmacy (with phone number or street name):  [facility] [id number] - [place], [place] - [address]  [address], [place] [address]  Phone:  [phone]  Fax:  [phone]   Agent: Please be advised that RX refills may take up to 3 business days. We ask that you follow-up with your pharmacy.

## 2022-05-10 ENCOUNTER — Other Ambulatory Visit: Payer: Self-pay | Admitting: Family Medicine

## 2022-05-10 DIAGNOSIS — F419 Anxiety disorder, unspecified: Secondary | ICD-10-CM

## 2022-05-10 DIAGNOSIS — F988 Other specified behavioral and emotional disorders with onset usually occurring in childhood and adolescence: Secondary | ICD-10-CM

## 2022-05-10 MED ORDER — AMPHETAMINE-DEXTROAMPHET ER 20 MG PO CP24: ORAL_CAPSULE | ORAL | 0 refills | Status: DC

## 2022-05-10 MED ORDER — CLONAZEPAM 0.5 MG PO TABS: 0.5000 mg | ORAL_TABLET | Freq: Two times a day (BID) | ORAL | 0 refills | Status: DC | PRN

## 2022-05-10 NOTE — Telephone Encounter (Signed)
Requesting: clonazepam 0.5mg  and Adderall XR 20mg   Contract: 11/12/21 UDS: 11/12/21 Last Visit: 11/12/21 Next Visit: None Last Refill on clonazepam: 04/09/22 #60 and 0RF Last Refill on Adderall XR: 04/16/22 #60 and 0RF  Please Advise

## 2022-05-12 ENCOUNTER — Telehealth: Payer: Self-pay | Admitting: Family Medicine

## 2022-05-12 MED ORDER — ONDANSETRON 8 MG PO TBDP: 8.0000 mg | ORAL_TABLET | Freq: Three times a day (TID) | ORAL | 2 refills | Status: DC | PRN

## 2022-05-12 MED ORDER — LOPERAMIDE HCL 2 MG PO CAPS: 2.0000 mg | ORAL_CAPSULE | Freq: Four times a day (QID) | ORAL | 2 refills | Status: DC | PRN

## 2022-05-12 NOTE — Addendum Note (Signed)
Addended by: Roxanne Gates on: 05/12/2022 04:05 PM   Modules accepted: Orders

## 2022-05-12 NOTE — Telephone Encounter (Signed)
[name] (spouse) called back stating he had given [place] the wrong medication to refill for her diarrhea. Here is the correct info:  Medication:   loperamide (IMODIUM) 2 MG capsule [[id number]]   Has the patient contacted their pharmacy? No. (If no, request that the patient contact the pharmacy for the refill.) (If yes, when and what did the pharmacy advise?)  Preferred Pharmacy (with phone number or street name):   [facility] [id number] - [place], [place] - [address]  [address], [place] [address]  Phone:  [phone]  Fax:  [phone]   Agent: Please be advised that RX refills may take up to 3 business days. We ask that you follow-up with your pharmacy.

## 2022-05-12 NOTE — Telephone Encounter (Signed)
Rx sent 

## 2022-05-12 NOTE — Telephone Encounter (Signed)
Rx refilled.

## 2022-05-12 NOTE — Telephone Encounter (Signed)
[name] (spouse) called stating he needed a refill for ondansetron for the pt's diarrhea returning:  Medication:   ondansetron (ZOFRAN-ODT) 8 MG disintegrating tablet [[id number]]   Has the patient contacted their pharmacy? No. (If no, request that the patient contact the pharmacy for the refill.) (If yes, when and what did the pharmacy advise?)  Preferred Pharmacy (with phone number or street name):   [facility] [id number] - [place], [place] - [address]  [address], [place] [address]  Phone:  [phone]  Fax:  [phone]   Agent: Please be advised that RX refills may take up to 3 business days. We ask that you follow-up with your pharmacy.

## 2022-05-13 DIAGNOSIS — G894 Chronic pain syndrome: Secondary | ICD-10-CM | POA: Diagnosis not present

## 2022-05-13 DIAGNOSIS — M542 Cervicalgia: Secondary | ICD-10-CM | POA: Diagnosis not present

## 2022-05-13 DIAGNOSIS — M40209 Unspecified kyphosis, site unspecified: Secondary | ICD-10-CM | POA: Diagnosis not present

## 2022-05-15 ENCOUNTER — Other Ambulatory Visit: Payer: Self-pay | Admitting: Family Medicine

## 2022-05-15 DIAGNOSIS — F411 Generalized anxiety disorder: Secondary | ICD-10-CM

## 2022-05-21 ENCOUNTER — Other Ambulatory Visit: Payer: Self-pay | Admitting: Family Medicine

## 2022-05-21 DIAGNOSIS — F988 Other specified behavioral and emotional disorders with onset usually occurring in childhood and adolescence: Secondary | ICD-10-CM

## 2022-05-21 DIAGNOSIS — F411 Generalized anxiety disorder: Secondary | ICD-10-CM

## 2022-05-24 MED ORDER — AMPHETAMINE-DEXTROAMPHET ER 20 MG PO CP24: ORAL_CAPSULE | ORAL | 0 refills | Status: DC

## 2022-05-28 ENCOUNTER — Other Ambulatory Visit: Payer: Self-pay | Admitting: Family Medicine

## 2022-05-28 ENCOUNTER — Encounter: Payer: Self-pay | Admitting: *Deleted

## 2022-05-28 DIAGNOSIS — R519 Headache, unspecified: Secondary | ICD-10-CM

## 2022-06-06 ENCOUNTER — Other Ambulatory Visit: Payer: Self-pay | Admitting: Family Medicine

## 2022-06-06 DIAGNOSIS — R519 Headache, unspecified: Secondary | ICD-10-CM

## 2022-06-08 ENCOUNTER — Other Ambulatory Visit: Payer: Self-pay | Admitting: Family Medicine

## 2022-06-08 DIAGNOSIS — F419 Anxiety disorder, unspecified: Secondary | ICD-10-CM

## 2022-06-08 MED ORDER — CLONAZEPAM 0.5 MG PO TABS: 0.5000 mg | ORAL_TABLET | Freq: Two times a day (BID) | ORAL | 0 refills | Status: DC | PRN

## 2022-06-08 NOTE — Telephone Encounter (Signed)
Requesting: clonazepam 0.5mg  Contract: 11/12/21  UDS: 11/12/21 Last Visit: 11/12/21 Next Visit: None Last Refill: 05/10/22 #60 and 0RF  Please Advise

## 2022-06-15 DIAGNOSIS — M40209 Unspecified kyphosis, site unspecified: Secondary | ICD-10-CM | POA: Diagnosis not present

## 2022-06-15 DIAGNOSIS — G894 Chronic pain syndrome: Secondary | ICD-10-CM | POA: Diagnosis not present

## 2022-06-15 DIAGNOSIS — M542 Cervicalgia: Secondary | ICD-10-CM | POA: Diagnosis not present

## 2022-06-18 ENCOUNTER — Other Ambulatory Visit: Payer: Self-pay | Admitting: Family Medicine

## 2022-06-18 DIAGNOSIS — F988 Other specified behavioral and emotional disorders with onset usually occurring in childhood and adolescence: Secondary | ICD-10-CM

## 2022-06-18 MED ORDER — AMPHETAMINE-DEXTROAMPHET ER 20 MG PO CP24: ORAL_CAPSULE | ORAL | 0 refills | Status: DC

## 2022-06-18 NOTE — Telephone Encounter (Signed)
Requesting:adderall 20 mg Contract:11/12/21 UDS:11/12/21 Last Visit:11/12/21 Next Visit:unknown Last Refill:05/24/22  Please Advise

## 2022-06-18 NOTE — Telephone Encounter (Signed)
Called patient to get her schedule   LVM

## 2022-06-30 ENCOUNTER — Other Ambulatory Visit: Payer: Self-pay | Admitting: Family Medicine

## 2022-06-30 ENCOUNTER — Telehealth: Payer: Self-pay

## 2022-06-30 DIAGNOSIS — F988 Other specified behavioral and emotional disorders with onset usually occurring in childhood and adolescence: Secondary | ICD-10-CM

## 2022-06-30 MED ORDER — AMPHETAMINE-DEXTROAMPHET ER 20 MG PO CP24
ORAL_CAPSULE | ORAL | 0 refills | Status: DC
Start: 2022-06-30 — End: 2022-08-06

## 2022-06-30 NOTE — Telephone Encounter (Signed)
Pt's sig is wrong, it suppose to say take 1 tablet twice daily for her adderall.

## 2022-07-04 ENCOUNTER — Other Ambulatory Visit: Payer: Self-pay | Admitting: Family Medicine

## 2022-07-04 DIAGNOSIS — F419 Anxiety disorder, unspecified: Secondary | ICD-10-CM

## 2022-07-05 ENCOUNTER — Other Ambulatory Visit: Payer: Self-pay | Admitting: Family Medicine

## 2022-07-05 DIAGNOSIS — F419 Anxiety disorder, unspecified: Secondary | ICD-10-CM

## 2022-07-05 MED ORDER — CLONAZEPAM 0.5 MG PO TABS: 0.5000 mg | ORAL_TABLET | Freq: Two times a day (BID) | ORAL | 0 refills | Status: DC | PRN

## 2022-07-05 NOTE — Telephone Encounter (Addendum)
Patient is requesting a refill of the following medications: Requested Prescriptions   Pending Prescriptions Disp Refills   clonazePAM (KLONOPIN) 0.5 MG tablet 60 tablet 0    Sig: Take 1 tablet (0.5 mg total) by mouth 2 (two) times daily as needed.    Date of patient request: 06/30/22 Last refill amount: 60  Follow up time period per chart: 07/08/22  CSC & cheek swab in place of UDS- 11/12/21

## 2022-07-08 ENCOUNTER — Encounter: Payer: Self-pay | Admitting: Family Medicine

## 2022-07-08 ENCOUNTER — Other Ambulatory Visit: Payer: Self-pay | Admitting: Family Medicine

## 2022-07-08 ENCOUNTER — Telehealth: Payer: Medicare PPO | Admitting: Family Medicine

## 2022-07-08 DIAGNOSIS — R519 Headache, unspecified: Secondary | ICD-10-CM

## 2022-07-09 DIAGNOSIS — G894 Chronic pain syndrome: Secondary | ICD-10-CM | POA: Diagnosis not present

## 2022-07-09 DIAGNOSIS — M4722 Other spondylosis with radiculopathy, cervical region: Secondary | ICD-10-CM | POA: Diagnosis not present

## 2022-07-09 DIAGNOSIS — M4120 Other idiopathic scoliosis, site unspecified: Secondary | ICD-10-CM | POA: Diagnosis not present

## 2022-08-05 ENCOUNTER — Other Ambulatory Visit: Payer: Self-pay | Admitting: Family Medicine

## 2022-08-05 DIAGNOSIS — F419 Anxiety disorder, unspecified: Secondary | ICD-10-CM

## 2022-08-05 DIAGNOSIS — R519 Headache, unspecified: Secondary | ICD-10-CM

## 2022-08-05 DIAGNOSIS — F988 Other specified behavioral and emotional disorders with onset usually occurring in childhood and adolescence: Secondary | ICD-10-CM

## 2022-08-06 ENCOUNTER — Other Ambulatory Visit: Payer: Self-pay | Admitting: Family Medicine

## 2022-08-06 DIAGNOSIS — F988 Other specified behavioral and emotional disorders with onset usually occurring in childhood and adolescence: Secondary | ICD-10-CM

## 2022-08-06 MED ORDER — AMPHETAMINE-DEXTROAMPHET ER 20 MG PO CP24: ORAL_CAPSULE | ORAL | 0 refills | Status: DC

## 2022-08-06 NOTE — Telephone Encounter (Signed)
Requesting: adderall Contract: 11/12/2021 UDS:11/12/2021 Last Visit: 11/12/2021 Next Visit: n/a Last Refill:06/30/22  Please Advise

## 2022-08-09 ENCOUNTER — Other Ambulatory Visit: Payer: Self-pay | Admitting: Family Medicine

## 2022-08-09 DIAGNOSIS — R519 Headache, unspecified: Secondary | ICD-10-CM

## 2022-08-10 ENCOUNTER — Other Ambulatory Visit: Payer: Self-pay | Admitting: Family Medicine

## 2022-08-10 ENCOUNTER — Telehealth: Payer: Self-pay | Admitting: Family Medicine

## 2022-08-10 DIAGNOSIS — R519 Headache, unspecified: Secondary | ICD-10-CM

## 2022-08-10 MED ORDER — SUMATRIPTAN SUCCINATE 50 MG PO TABS: ORAL_TABLET | ORAL | 0 refills | Status: DC

## 2022-08-10 NOTE — Telephone Encounter (Signed)
Medication:  SUMAtriptan (IMITREX) 50 MG tablet  Preferred Pharmacy (with phone number or street name): [facility] [id number] - [place], [place] - [address]  Second Request (original 9.28)- patient out of meds   Agent: Please be advised that RX refills may take up to 3 business days. We ask that you follow-up with your pharmacy.

## 2022-08-10 NOTE — Telephone Encounter (Signed)
Refills sent

## 2022-08-16 ENCOUNTER — Telehealth: Payer: Self-pay | Admitting: *Deleted

## 2022-08-16 NOTE — Telephone Encounter (Signed)
Who Is Calling Patient / Member / Family / Caregiver Call Type Triage / Clinical Relationship To Patient Self Return Phone Number [phone] (Secondary) Chief Complaint Prescription Refill or Medication Request (non symptomatic) Reason for Call Request to Schedule Office Appointment Initial Comment Caller states she needs to make an appointment. She needs a refill of her medication. Translation No Disp. Time Eilene Ghazi Time) Disposition Final User 08/14/2022 2:14:56 PM Attempt made - message left Etter Sjogren 08/14/2022 2:16:04 PM Attempt made - message left Etter Sjogren 08/14/2022 2:24:26 PM Attempt made - no message left Etter Sjogren 08/14/2022 2:36:07 PM FINAL ATTEMPT MADE - message left Yes Alvis Lemmings RN, Marcie Bal Final Disposition 08/14/2022 2:36:07 PM FINAL ATTEMPT MADE - message left Yes Alvis Lemmings, RN, Marcie Bal

## 2022-08-16 NOTE — Telephone Encounter (Signed)
Left detailed message for patient to call back to schedule appointment and what medication she needs.

## 2022-08-17 DIAGNOSIS — S82402A Unspecified fracture of shaft of left fibula, initial encounter for closed fracture: Secondary | ICD-10-CM | POA: Diagnosis not present

## 2022-08-17 DIAGNOSIS — M4120 Other idiopathic scoliosis, site unspecified: Secondary | ICD-10-CM | POA: Diagnosis not present

## 2022-08-17 DIAGNOSIS — G894 Chronic pain syndrome: Secondary | ICD-10-CM | POA: Diagnosis not present

## 2022-08-17 DIAGNOSIS — M542 Cervicalgia: Secondary | ICD-10-CM | POA: Diagnosis not present

## 2022-08-24 ENCOUNTER — Other Ambulatory Visit: Payer: Self-pay

## 2022-08-27 ENCOUNTER — Other Ambulatory Visit: Payer: Self-pay | Admitting: Family Medicine

## 2022-08-27 DIAGNOSIS — I10 Essential (primary) hypertension: Secondary | ICD-10-CM

## 2022-08-27 MED ORDER — LOSARTAN POTASSIUM 100 MG PO TABS: 100.0000 mg | ORAL_TABLET | Freq: Every day | ORAL | 3 refills | Status: DC

## 2022-09-05 ENCOUNTER — Other Ambulatory Visit: Payer: Self-pay | Admitting: Family Medicine

## 2022-09-05 DIAGNOSIS — F419 Anxiety disorder, unspecified: Secondary | ICD-10-CM

## 2022-09-05 DIAGNOSIS — F988 Other specified behavioral and emotional disorders with onset usually occurring in childhood and adolescence: Secondary | ICD-10-CM

## 2022-09-06 ENCOUNTER — Ambulatory Visit: Payer: Medicare PPO | Admitting: Family Medicine

## 2022-09-06 MED ORDER — AMPHETAMINE-DEXTROAMPHET ER 20 MG PO CP24: ORAL_CAPSULE | ORAL | 0 refills | Status: DC

## 2022-09-06 MED ORDER — CLONAZEPAM 0.5 MG PO TABS: 0.5000 mg | ORAL_TABLET | Freq: Two times a day (BID) | ORAL | 0 refills | Status: DC | PRN

## 2022-09-06 NOTE — Telephone Encounter (Signed)
Requesting: Adderall XR 20mg  and clonazepam 0.5mg   Contract: 11/12/21 UDS: 11/12/21 Last Visit: 11/12/21 Next Visit: 09/07/22 Last Refill on Adderall: 08/06/22 #60 and 0RF Last Refill on clonazepam: 07/05/22 #60 and 0RF   Please Advise

## 2022-09-07 ENCOUNTER — Ambulatory Visit: Payer: Medicare PPO | Admitting: Family Medicine

## 2022-09-07 ENCOUNTER — Telehealth: Payer: Self-pay | Admitting: Family Medicine

## 2022-09-07 NOTE — Progress Notes (Incomplete)
Subjective:   By signing my name below, I, Cassell Clement, attest that this documentation has been prepared under the direction and in the presence of @ENCPROV @. @ENCDATE @     Patient ID: [name], female    DOB: Jan 13, 1946, 76 y.o.   MRN: [id number]  No chief complaint on file.   HPI Patient is in today for ***  Past Medical History:  Diagnosis Date   Abrasion of right heel    during admission 10/ 2019 right heel open skin due to movement on sheet   ADD (attention deficit disorder)    Anemia, mild    Anxiety    Arthritis    "joints, back"   At high risk for falls    10-19-2018 pt has fell twice in past week, tripped   Chronic back pain    "all over my back" (10/02/2013)   Crushing injury of arm, right 02/06/1989's   "it was crushed; wore cast from fingers to top of my shoulder" (11/25/2   Crushing injury of left wrist 05/11/1989's   DOE (dyspnea on exertion)    Essential tremor    Fibromyalgia    History of palpitations 2002   recurrent   History of panic attacks    History of pneumonia 08/21/2018   w/ acute respiratory failure/ hypoxia   History of sepsis    admission 08-21-2018  secondary to uropathy obstructive from ureteral stone    Hyperlipidemia    Hypertension    10-19-2018 PER PT AMLODIPINE ON HOLD SINCE 10/ 2019 DUE TO KIDNEY ISSUES PER DOCTOR   Idiopathic scoliosis 04/19/2013   Left ureteral stone    Migraines    "once/month" (10/02/2013)   Osteoporosis    Other vitamin B12 deficiency anemia    PONV (postoperative nausea and vomiting)    As a child   Pulmonary nodules/lesions, multiple    Renal insufficiency    S/P gastric bypass 09/16/2003   S/P insertion of spinal cord stimulator    per pt remote is missing   Wears dentures    upper   Wears glasses    Wears glasses     Past Surgical History:  Procedure Laterality Date   ABDOMINAL HYSTERECTOMY  1980's   W/  BSO  AND APPENDECTOMY   CARDIAC CATHETERIZATION  12-13-2002    dr 13/06/2003   normal  coronaries   CYSTOSCOPY WITH URETEROSCOPY AND STENT PLACEMENT Left 09/27/2018   Procedure: LEFT  URETEROSCOPY, HOLMIUM LASER LITHOTRIPSY;  Surgeon: Eden Emms, MD;  Location: [facility];  Service: Urology;  Laterality: Left;   D & C HYSTERSCOPY RESECTION MYOMECTOMY  1980s   HUMERUS FRACTURE SURGERY Left 04/17/2019    Comminuted complex left intra-articular distal humerus fracture/supracondylar humerus fracture displaced   INTRAMEDULLARY (IM) NAIL INTERTROCHANTERIC Right 06/01/2019   Procedure: INTRAMEDULLARY (IM) NAIL INTERTROCHANTRIC;  Surgeon: 06/17/2019, MD;  Location: [facility];  Service: Orthopedics;  Laterality: Right;   IR NEPHROSTOMY PLACEMENT LEFT  08/22/2018   IR NEPHROSTOMY PLACEMENT LEFT  12/08/2018   KNEE ARTHROSCOPY Right 03-31-2001    dr 12/10/2018 @[facility]    NEPHROLITHOTOMY Left 10/20/2018   Procedure: LEFT NEPHROLITHOTOMY PERCUTANEOUS;  Surgeon: Simonne Come, MD;  Location: [facility];  Service: Urology;  Laterality: Left;   ORIF HUMERUS FRACTURE Left 04/17/2019   Procedure: Open reduction internal fixation left supracondylar humerus fracture with olecranon osteotomy and ulnar nerve release and anterior transposition and repair of structures as necessary;  Surgeon: 10/22/2018, MD;  Location: [facility];  Service:  Orthopedics;  Laterality: Left;  3 hrs   REDUCTION MAMMAPLASTY Bilateral 2001   ROBOT ASSISTED LAPAROSCOPIC NEPHRECTOMY Left 12/25/2018   Procedure: XI ROBOTIC ASSISTED LAPAROSCOPIC NEPHRECTOMY, LEFT FLANK EXPLORATION WITH REMOVAL OF BATTERY PACK;  Surgeon: Ardis Hughs, MD;  Location: [facility];  Service: Urology;  Laterality: Left;   ROUX-EN-Y GASTRIC BYPASS  09-16-2003   dr Jerilynn Mages. Hassell Done  @[facility]    via Laparoscopy w/ gastrojejunostomy   SPINAL CORD STIMULATOR IMPLANT  2016   10-19-2018  PER PT HAS REMOTE BUT HAS NOT USED IT SINCE DEC 2018   TONSILLECTOMY AND ADENOIDECTOMY  1955   TUBAL LIGATION  1980's    Family History  Adopted: Yes  Problem Relation Age of Onset    COPD Mother    Emphysema Mother    Heart attack Mother    Other Mother        tobacco abuse    Social History   Socioeconomic History   Marital status: Married    Spouse name: Not on file   Number of children: 2   Years of education: Not on file   Highest education level: Not on file  Occupational History   Occupation: Retired  Tobacco Use   Smoking status: Never   Smokeless tobacco: Never  Vaping Use   Vaping Use: Never used  Substance and Sexual Activity   Alcohol use: No   Drug use: No   Sexual activity: Never    Partners: Male  Other Topics Concern   Not on file  Social History Narrative   Not on file   Social Determinants of Health   Financial Resource Strain: Low Risk  (09/07/2021)   Overall Financial Resource Strain (CARDIA)    Difficulty of Paying Living Expenses: Not hard at all  Food Insecurity: No Food Insecurity (09/07/2021)   Hunger Vital Sign    Worried About Running Out of Food in the Last Year: Never true    [place] in the Last Year: Never true  Transportation Needs: No Transportation Needs (09/07/2021)   PRAPARE - Hydrologist (Medical): No    Lack of Transportation (Non-Medical): No  Physical Activity: Inactive (09/07/2021)   Exercise Vital Sign    Days of Exercise per Week: 0 days    Minutes of Exercise per Session: 0 min  Stress: No Stress Concern Present (09/07/2021)   [place]    Feeling of Stress : Not at all  Social Connections: Moderately Isolated (09/07/2021)   Social Connection and Isolation Panel [NHANES]    Frequency of Communication with Friends and Family: More than three times a week    Frequency of Social Gatherings with Friends and Family: Once a week    Attends Religious Services: Never    Marine scientist or Organizations: No    Attends Archivist Meetings: Never    Marital Status: Married  Arboriculturist Violence: Not At Risk (09/07/2021)   Humiliation, Afraid, Rape, and Kick questionnaire    Fear of Current or Ex-Partner: No    Emotionally Abused: No    Physically Abused: No    Sexually Abused: No    Outpatient Medications Prior to Visit  Medication Sig Dispense Refill   amphetamine-dextroamphetamine (ADDERALL XR) 20 MG 24 hr capsule 1 po bid 60 capsule 0   clonazePAM (KLONOPIN) 0.5 MG tablet Take 1 tablet (0.5 mg total) by mouth 2 (two) times daily as needed.  60 tablet 0   loperamide (IMODIUM) 2 MG capsule Take 1 capsule (2 mg total) by mouth 4 (four) times daily as needed for diarrhea or loose stools. 12 capsule 2   losartan (COZAAR) 100 MG tablet Take 1 tablet (100 mg total) by mouth daily. 90 tablet 3   ondansetron (ZOFRAN-ODT) 8 MG disintegrating tablet Take 1 tablet (8 mg total) by mouth every 8 (eight) hours as needed for nausea or vomiting. 12 tablet 2   oxyCODONE (ROXICODONE) 15 MG immediate release tablet Take 1 tablet (15 mg total) by mouth 4 (four) times daily. (Patient taking differently: Take 15 mg by mouth 5 (five) times daily.) 120 tablet 0   pantoprazole (PROTONIX) 20 MG tablet Take 1 tablet (20 mg total) by mouth daily. 30 tablet 0   promethazine (PHENERGAN) 25 MG suppository Place 1 suppository (25 mg total) rectally every 6 (six) hours as needed for nausea or vomiting. 12 each 0   SUMAtriptan (IMITREX) 50 MG tablet TAKE 1 TABLET BY MOUTH EVERY 2 HOURS AS NEEDED FOR MIGRAINE. MAX OF 2 DOSES IN 24 HOURS 10 tablet 0   venlafaxine XR (EFFEXOR-XR) 75 MG 24 hr capsule TAKE TWO CAPSULES BY MOUTH DAILY 180 capsule 1   No facility-administered medications prior to visit.    Allergies  Allergen Reactions   Zanaflex [Tizanidine]     hallucinations   Adhesive [Tape] Itching    ROS     Objective:    Physical Exam  There were no vitals taken for this visit. Wt Readings from Last 3 Encounters:  02/02/22 138 lb 3.7 oz (62.7 kg)  11/12/21 138 lb 3.2 oz (62.7 kg)   09/07/21 135 lb (61.2 kg)    Diabetic Foot Exam - Simple   No data filed    Lab Results  Component Value Date   WBC 5.1 02/01/2022   HGB 15.3 (H) 02/01/2022   HCT 45.8 02/01/2022   PLT 166 02/01/2022   GLUCOSE 97 02/01/2022   CHOL 159 11/30/2021   TRIG 78.0 11/30/2021   HDL 85.10 11/30/2021   LDLDIRECT 129.1 07/17/2007   LDLCALC 59 11/30/2021   ALT 8 02/01/2022   AST 16 02/01/2022   NA 135 02/01/2022   K 3.5 02/01/2022   CL 99 02/01/2022   CREATININE 0.94 02/01/2022   BUN 9 02/01/2022   CO2 27 02/01/2022   TSH 1.925 08/30/2018   INR 1.0 05/31/2019   HGBA1C 5.3 04/22/2014    Lab Results  Component Value Date   TSH 1.925 08/30/2018   Lab Results  Component Value Date   WBC 5.1 02/01/2022   HGB 15.3 (H) 02/01/2022   HCT 45.8 02/01/2022   MCV 95.6 02/01/2022   PLT 166 02/01/2022   Lab Results  Component Value Date   NA 135 02/01/2022   K 3.5 02/01/2022   CO2 27 02/01/2022   GLUCOSE 97 02/01/2022   BUN 9 02/01/2022   CREATININE 0.94 02/01/2022   BILITOT 0.8 02/01/2022   ALKPHOS 110 02/01/2022   AST 16 02/01/2022   ALT 8 02/01/2022   PROT 7.2 02/01/2022   ALBUMIN 4.2 02/01/2022   CALCIUM 9.6 02/01/2022   ANIONGAP 9 02/01/2022   GFR 53.19 (L) 11/30/2021   Lab Results  Component Value Date   CHOL 159 11/30/2021   Lab Results  Component Value Date   HDL 85.10 11/30/2021   Lab Results  Component Value Date   LDLCALC 59 11/30/2021   Lab Results  Component Value Date  TRIG 78.0 11/30/2021   Lab Results  Component Value Date   CHOLHDL 2 11/30/2021   Lab Results  Component Value Date   HGBA1C 5.3 04/22/2014       Assessment & Plan:   Problem List Items Addressed This Visit   None   @ENCMEDP @  No orders of the defined types were placed in this encounter.   I, , personally preformed the services described in this documentation.  All medical record entries made by the scribe were at my direction and in my presence.  I  have reviewed the chart and discharge instructions (if applicable) and agree that the record reflects my personal performance and is accurate and complete. @ENCDATE @     Cassell Clement

## 2022-09-07 NOTE — Telephone Encounter (Signed)
Noted  

## 2022-09-07 NOTE — Telephone Encounter (Signed)
FYI- Patient called to r/s her appointment from today. She has a broken leg and her husband, Jenny Reichmann, is out of town and her son is tied up so he is not able to bring her like she thought so she has to reschedule until Monday.

## 2022-09-13 ENCOUNTER — Ambulatory Visit: Payer: Medicare PPO | Admitting: Family Medicine

## 2022-09-14 ENCOUNTER — Ambulatory Visit: Payer: Medicare PPO | Admitting: Family Medicine

## 2022-09-16 ENCOUNTER — Ambulatory Visit: Payer: Medicare PPO | Admitting: Family Medicine

## 2022-09-16 DIAGNOSIS — S82402D Unspecified fracture of shaft of left fibula, subsequent encounter for closed fracture with routine healing: Secondary | ICD-10-CM | POA: Diagnosis not present

## 2022-09-16 DIAGNOSIS — G894 Chronic pain syndrome: Secondary | ICD-10-CM | POA: Diagnosis not present

## 2022-09-16 DIAGNOSIS — M542 Cervicalgia: Secondary | ICD-10-CM | POA: Diagnosis not present

## 2022-09-16 DIAGNOSIS — M4120 Other idiopathic scoliosis, site unspecified: Secondary | ICD-10-CM | POA: Diagnosis not present

## 2022-09-17 ENCOUNTER — Ambulatory Visit: Payer: Medicare PPO | Admitting: Family Medicine

## 2022-09-17 NOTE — Progress Notes (Shared)
Subjective:   By signing my name below, I, [name], attest that this documentation has been prepared under the direction and in the presence of [name], 09/17/2022.   Patient ID: [name], female    DOB: 1945/11/19, 76 y.o.   MRN: [id number]  No chief complaint on file.   HPI Patient is in today for an office visit.  Health Maintenance Due  Topic Date Due   COLONOSCOPY (Pts 45-37yrs Insurance coverage will need to be confirmed)  02/27/2013   MAMMOGRAM  04/09/2014   TETANUS/TDAP  10/29/2019   COVID-19 Vaccine (1) 09/22/2020   Zoster Vaccines- Shingrix (2 of 2) 01/25/2022   INFLUENZA VACCINE  06/08/2022   Medicare Annual Wellness (AWV)  09/07/2022    Past Medical History:  Diagnosis Date   Abrasion of right heel    during admission 10/ 2019 right heel open skin due to movement on sheet   ADD (attention deficit disorder)    Anemia, mild    Anxiety    Arthritis    "joints, back"   At high risk for falls    10-19-2018 pt has fell twice in past week, tripped   Chronic back pain    "all over my back" (10/02/2013)   Crushing injury of arm, right 02/06/1989's   "it was crushed; wore cast from fingers to top of my shoulder" (11/25/2   Crushing injury of left wrist 05/11/1989's   DOE (dyspnea on exertion)    Essential tremor    Fibromyalgia    History of palpitations 2002   recurrent   History of panic attacks    History of pneumonia 08/21/2018   w/ acute respiratory failure/ hypoxia   History of sepsis    admission 08-21-2018  secondary to uropathy obstructive from ureteral stone    Hyperlipidemia    Hypertension    10-19-2018 PER PT AMLODIPINE ON HOLD SINCE 10/ 2019 DUE TO KIDNEY ISSUES PER DOCTOR   Idiopathic scoliosis 04/19/2013   Left ureteral stone    Migraines    "once/month" (10/02/2013)   Osteoporosis    Other vitamin B12 deficiency anemia    PONV (postoperative nausea and vomiting)    As a child   Pulmonary nodules/lesions, multiple     Renal insufficiency    S/P gastric bypass 09/16/2003   S/P insertion of spinal cord stimulator    per pt remote is missing   Wears dentures    upper   Wears glasses    Wears glasses     Past Surgical History:  Procedure Laterality Date   ABDOMINAL HYSTERECTOMY  1980's   W/  BSO  AND APPENDECTOMY   CARDIAC CATHETERIZATION  12-13-2002    dr Eden Emms   normal coronaries   CYSTOSCOPY WITH URETEROSCOPY AND STENT PLACEMENT Left 09/27/2018   Procedure: LEFT  URETEROSCOPY, HOLMIUM LASER LITHOTRIPSY;  Surgeon: Crist Fat, MD;  Location: [facility];  Service: Urology;  Laterality: Left;   D & C HYSTERSCOPY RESECTION MYOMECTOMY  1980s   HUMERUS FRACTURE SURGERY Left 04/17/2019    Comminuted complex left intra-articular distal humerus fracture/supracondylar humerus fracture displaced   INTRAMEDULLARY (IM) NAIL INTERTROCHANTERIC Right 06/01/2019   Procedure: INTRAMEDULLARY (IM) NAIL INTERTROCHANTRIC;  Surgeon: Ollen Gross, MD;  Location: [facility];  Service: Orthopedics;  Laterality: Right;   IR NEPHROSTOMY PLACEMENT LEFT  08/22/2018   IR NEPHROSTOMY PLACEMENT LEFT  12/08/2018   KNEE ARTHROSCOPY Right 03-31-2001    dr aplington @[facility]    NEPHROLITHOTOMY Left 10/20/2018  Procedure: LEFT NEPHROLITHOTOMY PERCUTANEOUS;  Surgeon: Crist Fat, MD;  Location: [facility];  Service: Urology;  Laterality: Left;   ORIF HUMERUS FRACTURE Left 04/17/2019   Procedure: Open reduction internal fixation left supracondylar humerus fracture with olecranon osteotomy and ulnar nerve release and anterior transposition and repair of structures as necessary;  Surgeon: Dominica Severin, MD;  Location: [facility];  Service: Orthopedics;  Laterality: Left;  3 hrs   REDUCTION MAMMAPLASTY Bilateral 2001   ROBOT ASSISTED LAPAROSCOPIC NEPHRECTOMY Left 12/25/2018   Procedure: XI ROBOTIC ASSISTED LAPAROSCOPIC NEPHRECTOMY, LEFT FLANK EXPLORATION WITH REMOVAL OF BATTERY PACK;  Surgeon: Crist Fat, MD;  Location: [facility];   Service: Urology;  Laterality: Left;   ROUX-EN-Y GASTRIC BYPASS  09-16-2003   dr Judie Petit. Daphine Deutscher  @[facility]    via Laparoscopy w/ gastrojejunostomy   SPINAL CORD STIMULATOR IMPLANT  2016   10-19-2018  PER PT HAS REMOTE BUT HAS NOT USED IT SINCE DEC 2018   TONSILLECTOMY AND ADENOIDECTOMY  1955   TUBAL LIGATION  1980's    Family History  Adopted: Yes  Problem Relation Age of Onset   COPD Mother    Emphysema Mother    Heart attack Mother    Other Mother        tobacco abuse    Social History   Socioeconomic History   Marital status: Married    Spouse name: Not on file   Number of children: 2   Years of education: Not on file   Highest education level: Not on file  Occupational History   Occupation: Retired  Tobacco Use   Smoking status: Never   Smokeless tobacco: Never  Vaping Use   Vaping Use: Never used  Substance and Sexual Activity   Alcohol use: No   Drug use: No   Sexual activity: Never    Partners: Male  Other Topics Concern   Not on file  Social History Narrative   Not on file   Social Determinants of Health   Financial Resource Strain: Low Risk  (09/07/2021)   Overall Financial Resource Strain (CARDIA)    Difficulty of Paying Living Expenses: Not hard at all  Food Insecurity: No Food Insecurity (09/07/2021)   Hunger Vital Sign    Worried About Running Out of Food in the Last Year: Never true    Ran Out of Food in the Last Year: Never true  Transportation Needs: No Transportation Needs (09/07/2021)   PRAPARE - 09/09/2021 (Medical): No    Lack of Transportation (Non-Medical): No  Physical Activity: Inactive (09/07/2021)   Exercise Vital Sign    Days of Exercise per Week: 0 days    Minutes of Exercise per Session: 0 min  Stress: No Stress Concern Present (09/07/2021)   09/09/2021 of Occupational Health - Occupational Stress Questionnaire    Feeling of Stress : Not at all  Social Connections: Moderately Isolated (09/07/2021)    Social Connection and Isolation Panel [NHANES]    Frequency of Communication with Friends and Family: More than three times a week    Frequency of Social Gatherings with Friends and Family: Once a week    Attends Religious Services: Never    09/09/2021 or Organizations: No    Attends Database administrator Meetings: Never    Marital Status: Married  Banker Violence: Not At Risk (09/07/2021)   Humiliation, Afraid, Rape, and Kick questionnaire    Fear of Current or Ex-Partner: No  Emotionally Abused: No    Physically Abused: No    Sexually Abused: No    Outpatient Medications Prior to Visit  Medication Sig Dispense Refill   amphetamine-dextroamphetamine (ADDERALL XR) 20 MG 24 hr capsule 1 po bid 60 capsule 0   clonazePAM (KLONOPIN) 0.5 MG tablet Take 1 tablet (0.5 mg total) by mouth 2 (two) times daily as needed. 60 tablet 0   loperamide (IMODIUM) 2 MG capsule Take 1 capsule (2 mg total) by mouth 4 (four) times daily as needed for diarrhea or loose stools. 12 capsule 2   losartan (COZAAR) 100 MG tablet Take 1 tablet (100 mg total) by mouth daily. 90 tablet 3   ondansetron (ZOFRAN-ODT) 8 MG disintegrating tablet Take 1 tablet (8 mg total) by mouth every 8 (eight) hours as needed for nausea or vomiting. 12 tablet 2   oxyCODONE (ROXICODONE) 15 MG immediate release tablet Take 1 tablet (15 mg total) by mouth 4 (four) times daily. (Patient taking differently: Take 15 mg by mouth 5 (five) times daily.) 120 tablet 0   pantoprazole (PROTONIX) 20 MG tablet Take 1 tablet (20 mg total) by mouth daily. 30 tablet 0   promethazine (PHENERGAN) 25 MG suppository Place 1 suppository (25 mg total) rectally every 6 (six) hours as needed for nausea or vomiting. 12 each 0   SUMAtriptan (IMITREX) 50 MG tablet TAKE 1 TABLET BY MOUTH EVERY 2 HOURS AS NEEDED FOR MIGRAINE. MAX OF 2 DOSES IN 24 HOURS 10 tablet 0   venlafaxine XR (EFFEXOR-XR) 75 MG 24 hr capsule TAKE TWO CAPSULES BY MOUTH DAILY  180 capsule 1   No facility-administered medications prior to visit.    Allergies  Allergen Reactions   Zanaflex [Tizanidine]     hallucinations   Adhesive [Tape] Itching    ROS     Objective:    Physical Exam Constitutional:      General: She is not in acute distress.    Appearance: Normal appearance. She is not ill-appearing.  HENT:     Head: Normocephalic and atraumatic.     Right Ear: External ear normal.     Left Ear: External ear normal.  Eyes:     Extraocular Movements: Extraocular movements intact.     Pupils: Pupils are equal, round, and reactive to light.  Cardiovascular:     Rate and Rhythm: Normal rate and regular rhythm.     Heart sounds: Normal heart sounds. No murmur heard.    No gallop.  Pulmonary:     Effort: Pulmonary effort is normal. No respiratory distress.     Breath sounds: Normal breath sounds. No wheezing or rales.  Skin:    General: Skin is warm and dry.  Neurological:     Mental Status: She is alert and oriented to person, place, and time.  Psychiatric:        Judgment: Judgment normal.     There were no vitals taken for this visit. Wt Readings from Last 3 Encounters:  02/02/22 138 lb 3.7 oz (62.7 kg)  11/12/21 138 lb 3.2 oz (62.7 kg)  09/07/21 135 lb (61.2 kg)       Assessment & Plan:   Problem List Items Addressed This Visit   None  No orders of the defined types were placed in this encounter.   I, [name], personally preformed the services described in this documentation.  All medical record entries made by the scribe were at my direction and in my presence.  I have reviewed  the chart and discharge instructions (if applicable) and agree that the record reflects my personal performance and is accurate and complete. 09/17/2022.   Consuello Bossier, acting as a Neurosurgeon for [employer], DO.,have documented all relevant documentation on the behalf of Donato Schultz, DO,as directed by  Donato Schultz,  DO while in the presence of Donato Schultz, DO.    Gilman Buttner

## 2022-09-21 ENCOUNTER — Ambulatory Visit: Payer: Medicare PPO | Admitting: Family Medicine

## 2022-09-24 ENCOUNTER — Telehealth: Payer: Self-pay | Admitting: Family Medicine

## 2022-09-24 NOTE — Telephone Encounter (Signed)
Tried calling patient to schedule Medicare Annual Wellness Visit (AWV) either virtually   No answer    Last AWV 09/07/21 please schedule with Nurse Health Adviser   45 min for awv-i and in office appointments 30 min for awv-s  phone/virtual appointments

## 2022-09-27 ENCOUNTER — Ambulatory Visit (INDEPENDENT_AMBULATORY_CARE_PROVIDER_SITE_OTHER): Payer: Medicare PPO | Admitting: *Deleted

## 2022-09-27 DIAGNOSIS — Z1211 Encounter for screening for malignant neoplasm of colon: Secondary | ICD-10-CM

## 2022-09-27 DIAGNOSIS — Z0000 Encounter for general adult medical examination without abnormal findings: Secondary | ICD-10-CM

## 2022-09-27 DIAGNOSIS — Z1231 Encounter for screening mammogram for malignant neoplasm of breast: Secondary | ICD-10-CM

## 2022-09-27 NOTE — Patient Instructions (Signed)
Ms. Vanzile , Thank you for taking time to come for your Medicare Wellness Visit. I appreciate your ongoing commitment to your health goals. Please review the following plan we discussed and let me know if I can assist you in the future.   These are the goals we discussed:  Goals      Patient Stated     Eat a more balanced diet        This is a list of the screening recommended for you and due dates:  Health Maintenance  Topic Date Due   Colon Cancer Screening  02/27/2013   Mammogram  04/09/2014   COVID-19 Vaccine (1) 09/22/2020   Zoster (Shingles) Vaccine (2 of 2) 01/25/2022   Flu Shot  06/08/2022   Medicare Annual Wellness Visit  09/28/2023   Pneumonia Vaccine  Completed   DEXA scan (bone density measurement)  Completed   Hepatitis C Screening: USPSTF Recommendation to screen - Ages 44-79 yo.  Completed   HPV Vaccine  Aged Out     Next appointment: Follow up in one year for your annual wellness visit.   Preventive Care 74 Years and Older, Female Preventive care refers to lifestyle choices and visits with your health care provider that can promote health and wellness. What does preventive care include? A yearly physical exam. This is also called an annual well check. Dental exams once or twice a year. Routine eye exams. Ask your health care provider how often you should have your eyes checked. Personal lifestyle choices, including: Daily care of your teeth and gums. Regular physical activity. Eating a healthy diet. Avoiding tobacco and drug use. Limiting alcohol use. Practicing safe sex. Taking low-dose aspirin every day. Taking vitamin and mineral supplements as recommended by your health care provider. What happens during an annual well check? The services and screenings done by your health care provider during your annual well check will depend on your age, overall health, lifestyle risk factors, and family history of disease. Counseling  Your health care provider  may ask you questions about your: Alcohol use. Tobacco use. Drug use. Emotional well-being. Home and relationship well-being. Sexual activity. Eating habits. History of falls. Memory and ability to understand (cognition). Work and work Astronomer. Reproductive health. Screening  You may have the following tests or measurements: Height, weight, and BMI. Blood pressure. Lipid and cholesterol levels. These may be checked every 5 years, or more frequently if you are over 18 years old. Skin check. Lung cancer screening. You may have this screening every year starting at age 74 if you have a 30-pack-year history of smoking and currently smoke or have quit within the past 15 years. Fecal occult blood test (FOBT) of the stool. You may have this test every year starting at age 74. Flexible sigmoidoscopy or colonoscopy. You may have a sigmoidoscopy every 5 years or a colonoscopy every 10 years starting at age 74. Hepatitis C blood test. Hepatitis B blood test. Sexually transmitted disease (STD) testing. Diabetes screening. This is done by checking your blood sugar (glucose) after you have not eaten for a while (fasting). You may have this done every 1-3 years. Bone density scan. This is done to screen for osteoporosis. You may have this done starting at age 74. Mammogram. This may be done every 1-2 years. Talk to your health care provider about how often you should have regular mammograms. Talk with your health care provider about your test results, treatment options, and if necessary, the need for more tests.  Vaccines  Your health care provider may recommend certain vaccines, such as: Influenza vaccine. This is recommended every year. Tetanus, diphtheria, and acellular pertussis (Tdap, Td) vaccine. You may need a Td booster every 10 years. Zoster vaccine. You may need this after age 74. Pneumococcal 13-valent conjugate (PCV13) vaccine. One dose is recommended after age 74. Pneumococcal  polysaccharide (PPSV23) vaccine. One dose is recommended after age 74. Talk to your health care provider about which screenings and vaccines you need and how often you need them. This information is not intended to replace advice given to you by your health care provider. Make sure you discuss any questions you have with your health care provider. Document Released: 11/21/2015 Document Revised: 07/14/2016 Document Reviewed: 08/26/2015 Elsevier Interactive Patient Education  2017 [employer] Prevention in the Home Falls can cause injuries. They can happen to people of all ages. There are many things you can do to make your home safe and to help prevent falls. What can I do on the outside of my home? Regularly fix the edges of walkways and driveways and fix any cracks. Remove anything that might make you trip as you walk through a door, such as a raised step or threshold. Trim any bushes or trees on the path to your home. Use bright outdoor lighting. Clear any walking paths of anything that might make someone trip, such as rocks or tools. Regularly check to see if handrails are loose or broken. Make sure that both sides of any steps have handrails. Any raised decks and porches should have guardrails on the edges. Have any leaves, snow, or ice cleared regularly. Use sand or salt on walking paths during winter. Clean up any spills in your garage right away. This includes oil or grease spills. What can I do in the bathroom? Use night lights. Install grab bars by the toilet and in the tub and shower. Do not use towel bars as grab bars. Use non-skid mats or decals in the tub or shower. If you need to sit down in the shower, use a plastic, non-slip stool. Keep the floor dry. Clean up any water that spills on the floor as soon as it happens. Remove soap buildup in the tub or shower regularly. Attach bath mats securely with double-sided non-slip rug tape. Do not have throw rugs and other  things on the floor that can make you trip. What can I do in the bedroom? Use night lights. Make sure that you have a light by your bed that is easy to reach. Do not use any sheets or blankets that are too big for your bed. They should not hang down onto the floor. Have a firm chair that has side arms. You can use this for support while you get dressed. Do not have throw rugs and other things on the floor that can make you trip. What can I do in the kitchen? Clean up any spills right away. Avoid walking on wet floors. Keep items that you use a lot in easy-to-reach places. If you need to reach something above you, use a strong step stool that has a grab bar. Keep electrical cords out of the way. Do not use floor polish or wax that makes floors slippery. If you must use wax, use non-skid floor wax. Do not have throw rugs and other things on the floor that can make you trip. What can I do with my stairs? Do not leave any items on the stairs. Make sure that  there are handrails on both sides of the stairs and use them. Fix handrails that are broken or loose. Make sure that handrails are as long as the stairways. Check any carpeting to make sure that it is firmly attached to the stairs. Fix any carpet that is loose or worn. Avoid having throw rugs at the top or bottom of the stairs. If you do have throw rugs, attach them to the floor with carpet tape. Make sure that you have a light switch at the top of the stairs and the bottom of the stairs. If you do not have them, ask someone to add them for you. What else can I do to help prevent falls? Wear shoes that: Do not have high heels. Have rubber bottoms. Are comfortable and fit you well. Are closed at the toe. Do not wear sandals. If you use a stepladder: Make sure that it is fully opened. Do not climb a closed stepladder. Make sure that both sides of the stepladder are locked into place. Ask someone to hold it for you, if possible. Clearly  mark and make sure that you can see: Any grab bars or handrails. First and last steps. Where the edge of each step is. Use tools that help you move around (mobility aids) if they are needed. These include: Canes. Walkers. Scooters. Crutches. Turn on the lights when you go into a dark area. Replace any light bulbs as soon as they burn out. Set up your furniture so you have a clear path. Avoid moving your furniture around. If any of your floors are uneven, fix them. If there are any pets around you, be aware of where they are. Review your medicines with your doctor. Some medicines can make you feel dizzy. This can increase your chance of falling. Ask your doctor what other things that you can do to help prevent falls. This information is not intended to replace advice given to you by your health care provider. Make sure you discuss any questions you have with your health care provider. Document Released: 08/21/2009 Document Revised: 04/01/2016 Document Reviewed: 11/29/2014 Elsevier Interactive Patient Education  2017 [employer].

## 2022-09-27 NOTE — Progress Notes (Signed)
Subjective:   [name] is a 76 y.o. female who presents for Medicare Annual (Subsequent) preventive examination.  I connected with  [name] on 09/27/22 by a audio enabled telemedicine application and verified that I am speaking with the correct person using two identifiers.  Patient Location: Home  Provider Location: Office/Clinic  I discussed the limitations of evaluation and management by telemedicine. The patient expressed understanding and agreed to proceed.   Review of Systems    Defer to PCP Cardiac Risk Factors include: advanced age (>45men, >18 women);hypertension;dyslipidemia     Objective:    There were no vitals filed for this visit. There is no height or weight on file to calculate BMI.     09/27/2022    2:23 PM 02/02/2022    3:48 PM 02/01/2022    9:07 AM 09/07/2021    1:51 PM 03/06/2021    4:17 PM 06/01/2019    3:54 PM 05/31/2019    1:19 PM  Advanced Directives  Does Patient Have a Medical Advance Directive? No No No No No No No  Would patient like information on creating a medical advance directive? No - Patient declined   Yes (MAU/Ambulatory/Procedural Areas - Information given) No - Patient declined No - Patient declined No - Patient declined    Current Medications (verified) Outpatient Encounter Medications as of 09/27/2022  Medication Sig   amphetamine-dextroamphetamine (ADDERALL XR) 20 MG 24 hr capsule 1 po bid   clonazePAM (KLONOPIN) 0.5 MG tablet Take 1 tablet (0.5 mg total) by mouth 2 (two) times daily as needed.   loperamide (IMODIUM) 2 MG capsule Take 1 capsule (2 mg total) by mouth 4 (four) times daily as needed for diarrhea or loose stools.   losartan (COZAAR) 100 MG tablet Take 1 tablet (100 mg total) by mouth daily.   ondansetron (ZOFRAN-ODT) 8 MG disintegrating tablet Take 1 tablet (8 mg total) by mouth every 8 (eight) hours as needed for nausea or vomiting.   oxyCODONE (ROXICODONE) 15 MG immediate release tablet Take 1 tablet (15 mg  total) by mouth 4 (four) times daily. (Patient taking differently: Take 15 mg by mouth 5 (five) times daily.)   pantoprazole (PROTONIX) 20 MG tablet Take 1 tablet (20 mg total) by mouth daily.   promethazine (PHENERGAN) 25 MG suppository Place 1 suppository (25 mg total) rectally every 6 (six) hours as needed for nausea or vomiting.   SUMAtriptan (IMITREX) 50 MG tablet TAKE 1 TABLET BY MOUTH EVERY 2 HOURS AS NEEDED FOR MIGRAINE. MAX OF 2 DOSES IN 24 HOURS   venlafaxine XR (EFFEXOR-XR) 75 MG 24 hr capsule TAKE TWO CAPSULES BY MOUTH DAILY   No facility-administered encounter medications on file as of 09/27/2022.    Allergies (verified) Zanaflex [tizanidine] and Adhesive [tape]   History: Past Medical History:  Diagnosis Date   Abrasion of right heel    during admission 10/ 2019 right heel open skin due to movement on sheet   ADD (attention deficit disorder)    Anemia, mild    Anxiety    Arthritis    "joints, back"   At high risk for falls    10-19-2018 pt has fell twice in past week, tripped   Chronic back pain    "all over my back" (10/02/2013)   Crushing injury of arm, right 02/06/1989's   "it was crushed; wore cast from fingers to top of my shoulder" (11/25/2   Crushing injury of left wrist 05/11/1989's   DOE (dyspnea on exertion)  Essential tremor    Fibromyalgia    History of palpitations 2002   recurrent   History of panic attacks    History of pneumonia 08/21/2018   w/ acute respiratory failure/ hypoxia   History of sepsis    admission 08-21-2018  secondary to uropathy obstructive from ureteral stone    Hyperlipidemia    Hypertension    10-19-2018 PER PT AMLODIPINE ON HOLD SINCE 10/ 2019 DUE TO KIDNEY ISSUES PER DOCTOR   Idiopathic scoliosis 04/19/2013   Left ureteral stone    Migraines    "once/month" (10/02/2013)   Osteoporosis    Other vitamin B12 deficiency anemia    PONV (postoperative nausea and vomiting)    As a child   Pulmonary nodules/lesions, multiple     Renal insufficiency    S/P gastric bypass 09/16/2003   S/P insertion of spinal cord stimulator    per pt remote is missing   Wears dentures    upper   Wears glasses    Wears glasses    Past Surgical History:  Procedure Laterality Date   ABDOMINAL HYSTERECTOMY  1980's   W/  BSO  AND APPENDECTOMY   CARDIAC CATHETERIZATION  12-13-2002    dr Eden Emms   normal coronaries   CYSTOSCOPY WITH URETEROSCOPY AND STENT PLACEMENT Left 09/27/2018   Procedure: LEFT  URETEROSCOPY, HOLMIUM LASER LITHOTRIPSY;  Surgeon: Crist Fat, MD;  Location: [facility];  Service: Urology;  Laterality: Left;   D & C HYSTERSCOPY RESECTION MYOMECTOMY  1980s   HUMERUS FRACTURE SURGERY Left 04/17/2019    Comminuted complex left intra-articular distal humerus fracture/supracondylar humerus fracture displaced   INTRAMEDULLARY (IM) NAIL INTERTROCHANTERIC Right 06/01/2019   Procedure: INTRAMEDULLARY (IM) NAIL INTERTROCHANTRIC;  Surgeon: Ollen Gross, MD;  Location: [facility];  Service: Orthopedics;  Laterality: Right;   IR NEPHROSTOMY PLACEMENT LEFT  08/22/2018   IR NEPHROSTOMY PLACEMENT LEFT  12/08/2018   KNEE ARTHROSCOPY Right 03-31-2001    dr Simonne Come @[facility]    NEPHROLITHOTOMY Left 10/20/2018   Procedure: LEFT NEPHROLITHOTOMY PERCUTANEOUS;  Surgeon: 10/22/2018, MD;  Location: [facility];  Service: Urology;  Laterality: Left;   ORIF HUMERUS FRACTURE Left 04/17/2019   Procedure: Open reduction internal fixation left supracondylar humerus fracture with olecranon osteotomy and ulnar nerve release and anterior transposition and repair of structures as necessary;  Surgeon: 06/17/2019, MD;  Location: [facility];  Service: Orthopedics;  Laterality: Left;  3 hrs   REDUCTION MAMMAPLASTY Bilateral 2001   ROBOT ASSISTED LAPAROSCOPIC NEPHRECTOMY Left 12/25/2018   Procedure: XI ROBOTIC ASSISTED LAPAROSCOPIC NEPHRECTOMY, LEFT FLANK EXPLORATION WITH REMOVAL OF BATTERY PACK;  Surgeon: 12/27/2018, MD;  Location: [facility];  Service:  Urology;  Laterality: Left;   ROUX-EN-Y GASTRIC BYPASS  09-16-2003   dr 13-06-2003. Judie Petit  @[facility]    via Laparoscopy w/ gastrojejunostomy   SPINAL CORD STIMULATOR IMPLANT  2016   10-19-2018  PER PT HAS REMOTE BUT HAS NOT USED IT SINCE DEC 2018   TONSILLECTOMY AND ADENOIDECTOMY  1955   TUBAL LIGATION  1980's   Family History  Adopted: Yes  Problem Relation Age of Onset   COPD Mother    Emphysema Mother    Heart attack Mother    Other Mother        tobacco abuse   Social History   Socioeconomic History   Marital status: Married    Spouse name: Not on file   Number of children: 2   Years of education: Not on file   Highest  education level: Not on file  Occupational History   Occupation: Retired  Tobacco Use   Smoking status: Never   Smokeless tobacco: Never  Vaping Use   Vaping Use: Never used  Substance and Sexual Activity   Alcohol use: No   Drug use: No   Sexual activity: Never    Partners: Male  Other Topics Concern   Not on file  Social History Narrative   Not on file   Social Determinants of Health   Financial Resource Strain: Low Risk  (09/07/2021)   Overall Financial Resource Strain (CARDIA)    Difficulty of Paying Living Expenses: Not hard at all  Food Insecurity: No Food Insecurity (09/27/2022)   Hunger Vital Sign    Worried About Running Out of Food in the Last Year: Never true    Ran Out of Food in the Last Year: Never true  Transportation Needs: No Transportation Needs (09/27/2022)   PRAPARE - Administrator, Civil Service (Medical): No    Lack of Transportation (Non-Medical): No  Physical Activity: Inactive (09/07/2021)   Exercise Vital Sign    Days of Exercise per Week: 0 days    Minutes of Exercise per Session: 0 min  Stress: No Stress Concern Present (09/07/2021)   [employer] of Occupational Health - Occupational Stress Questionnaire    Feeling of Stress : Not at all  Social Connections: Moderately Isolated (09/07/2021)   Social  Connection and Isolation Panel [NHANES]    Frequency of Communication with Friends and Family: More than three times a week    Frequency of Social Gatherings with Friends and Family: Once a week    Attends Religious Services: Never    Database administrator or Organizations: No    Attends Engineer, structural: Never    Marital Status: Married    Tobacco Counseling Counseling given: Not Answered   Clinical Intake:  Pre-visit preparation completed: Yes  Pain : No/denies pain  Diabetes: No  How often do you need to have someone help you when you read instructions, pamphlets, or other written materials from your doctor or pharmacy?: 1 - Never   Activities of Daily Living    09/27/2022    2:27 PM  In your present state of health, do you have any difficulty performing the following activities:  Hearing? 1  Comment wears hearing aids  Vision? 0  Difficulty concentrating or making decisions? 1  Walking or climbing stairs? 1  Dressing or bathing? 0  Doing errands, shopping? 0  Preparing Food and eating ? N  Using the Toilet? N  In the past six months, have you accidently leaked urine? Y  Do you have problems with loss of bowel control? N  Managing your Medications? N  Managing your Finances? N  Housekeeping or managing your Housekeeping? N    Patient Care Team: Zola Button, Grayling Congress, DO as PCP - General Lars Masson, MD as PCP - [facility] (Cardiology)  Indicate any recent Medical Services you may have received from other than [facility] providers in the past year (date may be approximate).     Assessment:   This is a routine wellness examination for [place].  Hearing/Vision screen No results found.  Dietary issues and exercise activities discussed: Current Exercise Habits: The patient does not participate in regular exercise at present, Exercise limited by: orthopedic condition(s)   Goals Addressed   None    Depression Screen    09/27/2022     2:25  PM 09/07/2021    1:57 PM 12/03/2016   11:43 AM 10/11/2016   11:23 AM 07/01/2015    2:57 PM 04/22/2014   10:50 AM  PHQ 2/9 Scores  PHQ - 2 Score 2 1 0 0 0 0  PHQ- 9 Score 4         Fall Risk    09/27/2022    2:24 PM 09/07/2021    1:54 PM 10/16/2018    4:05 PM 12/03/2016   11:43 AM 10/11/2016   11:23 AM  Fall Risk   Falls in the past year? 1 1 1  Yes Yes  Number falls in past yr: 0 0 1 2 or more 2 or more  Injury with Fall? 0 0 1 No   Comment     pt report having 2 falls, pt report tripping over item in the store and hurting head, eye, and back.  Risk for fall due to : No Fall Risks      Follow up Falls evaluation completed Falls prevention discussed Falls evaluation completed;Education provided      FALL RISK PREVENTION PERTAINING TO THE HOME:  Any stairs in or around the home? Yes  If so, are there any without handrails? No  Home free of loose throw rugs in walkways, pet beds, electrical cords, etc? Yes  Adequate lighting in your home to reduce risk of falls? Yes   ASSISTIVE DEVICES UTILIZED TO PREVENT FALLS:  Life alert? No  Use of a cane, walker or w/c? Yes  Grab bars in the bathroom? Yes  Shower chair or bench in shower? Yes  Elevated toilet seat or a handicapped toilet? Yes   TIMED UP AND GO:  Was the test performed?  No, audio visit .    Cognitive Function:      01/15/2015    9:08 AM  Montreal Cognitive Assessment   Visuospatial/ Executive (0/5) 5  Naming (0/3) 3  Attention: Read list of digits (0/2) 2  Attention: Read list of letters (0/1) 1  Attention: Serial 7 subtraction starting at 100 (0/3) 3  Language: Repeat phrase (0/2) 2  Language : Fluency (0/1) 1  Abstraction (0/2) 2  Delayed Recall (0/5) 5  Orientation (0/6) 5  Total 29  Adjusted Score (based on education) 29      09/27/2022    2:32 PM  6CIT Screen  What Year? 0 points  What month? 0 points  What time? 0 points  Count back from 20 0 points  Months in reverse 0 points  Repeat  phrase 0 points  Total Score 0 points    Immunizations Immunization History  Administered Date(s) Administered   Fluad Quad(high Dose 65+) 11/30/2021   Influenza, High Dose Seasonal PF 09/03/2016, 07/04/2017, 08/26/2018   Influenza,inj,Quad PF,6+ Mos 10/15/2013, 11/28/2014   Moderna SARS-COV2 Booster Vaccination 09/22/2020   Pneumococcal Conjugate-13 10/15/2013   Pneumococcal Polysaccharide-23 12/05/2014   Td 10/28/2009   Zoster Recombinat (Shingrix) 11/30/2021   Zoster, Live 10/28/2009    TDAP status: Due, Education has been provided regarding the importance of this vaccine. Advised may receive this vaccine at local pharmacy or Health Dept. Aware to provide a copy of the vaccination record if obtained from local pharmacy or Health Dept. Verbalized acceptance and understanding.  Flu Vaccine status: Due, Education has been provided regarding the importance of this vaccine. Advised may receive this vaccine at local pharmacy or Health Dept. Aware to provide a copy of the vaccination record if obtained from local pharmacy or Health Dept. Verbalized  acceptance and understanding.  Pneumococcal vaccine status: Up to date  Covid-19 vaccine status: Information provided on how to obtain vaccines.   Qualifies for Shingles Vaccine? Yes   Zostavax completed Yes   Shingrix Completed?: No.    Education has been provided regarding the importance of this vaccine. Patient has been advised to call insurance company to determine out of pocket expense if they have not yet received this vaccine. Advised may also receive vaccine at local pharmacy or Health Dept. Verbalized acceptance and understanding.  Screening Tests Health Maintenance  Topic Date Due   COLONOSCOPY (Pts 45-57yrs Insurance coverage will need to be confirmed)  02/27/2013   MAMMOGRAM  04/09/2014   COVID-19 Vaccine (1) 09/22/2020   Zoster Vaccines- Shingrix (2 of 2) 01/25/2022   INFLUENZA VACCINE  06/08/2022   Medicare Annual Wellness  (AWV)  09/07/2022   Pneumonia Vaccine 79+ Years old  Completed   DEXA SCAN  Completed   Hepatitis C Screening  Completed   HPV VACCINES  Aged Out    Health Maintenance  Health Maintenance Due  Topic Date Due   COLONOSCOPY (Pts 45-4yrs Insurance coverage will need to be confirmed)  02/27/2013   MAMMOGRAM  04/09/2014   COVID-19 Vaccine (1) 09/22/2020   Zoster Vaccines- Shingrix (2 of 2) 01/25/2022   INFLUENZA VACCINE  06/08/2022   Medicare Annual Wellness (AWV)  09/07/2022    Colorectal cancer screening: Referral to GI placed 09/27/22. Pt aware the office will call re: appt.  Mammogram status: Ordered 09/27/22. Pt provided with contact info and advised to call to schedule appt.   Lung Cancer Screening: (Low Dose CT Chest recommended if Age 48-80 years, 30 pack-year currently smoking OR have quit w/in 15years.) does not qualify.     Additional Screening:  Hepatitis C Screening: does qualify; Completed 02/23/16  Vision Screening: Recommended annual ophthalmology exams for early detection of glaucoma and other disorders of the eye. Is the patient up to date with their annual eye exam?  Yes  Who is the provider or what is the name of the office in which the patient attends annual eye exams? Dr. Porfirio Mylar If pt is not established with a provider, would they like to be referred to a provider to establish care? No .   Dental Screening: Recommended annual dental exams for proper oral hygiene  Community Resource Referral / Chronic Care Management: CRR required this visit?  No   CCM required this visit?  No      Plan:     I have personally reviewed and noted the following in the patient's chart:   Medical and social history Use of alcohol, tobacco or illicit drugs  Current medications and supplements including opioid prescriptions. Patient is currently taking opioid prescriptions. Information provided to patient regarding non-opioid alternatives. Patient advised to discuss  non-opioid treatment plan with their provider. Functional ability and status Nutritional status Physical activity Advanced directives List of other physicians Hospitalizations, surgeries, and ER visits in previous 12 months Vitals Screenings to include cognitive, depression, and falls Referrals and appointments  In addition, I have reviewed and discussed with patient certain preventive protocols, quality metrics, and best practice recommendations. A written personalized care plan for preventive services as well as general preventive health recommendations were provided to patient.   Due to this being a telephonic visit, the after visit summary with patients personalized plan was offered to patient via mail or my-chart. Patient would like to access on my-chart.  Donne Anon, [facility]   09/27/2022  Nurse Notes: None

## 2022-09-29 ENCOUNTER — Other Ambulatory Visit: Payer: Self-pay | Admitting: Family Medicine

## 2022-09-29 ENCOUNTER — Telehealth: Payer: Self-pay | Admitting: Family Medicine

## 2022-09-29 DIAGNOSIS — R519 Headache, unspecified: Secondary | ICD-10-CM

## 2022-09-29 NOTE — Telephone Encounter (Signed)
Patient said that loperamide (IMODIUM) 2 MG capsule is out of stock and they don't know when it will come in. Patient's husband learned that they have imodium otc but not prescription. Patient will like to have some other prescription for diarrhea called in.

## 2022-10-04 NOTE — Telephone Encounter (Signed)
Pt called. Unable to leave a voicemail 

## 2022-10-05 ENCOUNTER — Telehealth: Payer: Self-pay | Admitting: *Deleted

## 2022-10-05 ENCOUNTER — Encounter: Payer: Self-pay | Admitting: Family Medicine

## 2022-10-05 ENCOUNTER — Telehealth (INDEPENDENT_AMBULATORY_CARE_PROVIDER_SITE_OTHER): Payer: Medicare PPO | Admitting: Family Medicine

## 2022-10-05 VITALS — BP 145/75 | Temp 100.2°F | Ht 65.0 in

## 2022-10-05 DIAGNOSIS — F411 Generalized anxiety disorder: Secondary | ICD-10-CM

## 2022-10-05 DIAGNOSIS — J0140 Acute pansinusitis, unspecified: Secondary | ICD-10-CM | POA: Diagnosis not present

## 2022-10-05 DIAGNOSIS — F419 Anxiety disorder, unspecified: Secondary | ICD-10-CM

## 2022-10-05 DIAGNOSIS — F988 Other specified behavioral and emotional disorders with onset usually occurring in childhood and adolescence: Secondary | ICD-10-CM | POA: Diagnosis not present

## 2022-10-05 DIAGNOSIS — R519 Headache, unspecified: Secondary | ICD-10-CM

## 2022-10-05 MED ORDER — VENLAFAXINE HCL ER 75 MG PO CP24: 150.0000 mg | ORAL_CAPSULE | Freq: Every day | ORAL | 1 refills | Status: DC

## 2022-10-05 MED ORDER — LOPERAMIDE HCL 2 MG PO CAPS: 2.0000 mg | ORAL_CAPSULE | Freq: Four times a day (QID) | ORAL | 2 refills | Status: DC | PRN

## 2022-10-05 MED ORDER — AMPHETAMINE-DEXTROAMPHET ER 20 MG PO CP24: ORAL_CAPSULE | ORAL | 0 refills | Status: DC

## 2022-10-05 MED ORDER — CLONAZEPAM 0.5 MG PO TABS: 0.5000 mg | ORAL_TABLET | Freq: Two times a day (BID) | ORAL | 0 refills | Status: DC | PRN

## 2022-10-05 MED ORDER — FLUTICASONE PROPIONATE 50 MCG/ACT NA SUSP: 2.0000 | Freq: Every day | NASAL | 6 refills | Status: DC

## 2022-10-05 MED ORDER — SUMATRIPTAN SUCCINATE 50 MG PO TABS: ORAL_TABLET | ORAL | 0 refills | Status: DC

## 2022-10-05 MED ORDER — AMOXICILLIN-POT CLAVULANATE 875-125 MG PO TABS: 1.0000 | ORAL_TABLET | Freq: Two times a day (BID) | ORAL | 0 refills | Status: DC

## 2022-10-05 MED ORDER — PROMETHAZINE-DM 6.25-15 MG/5ML PO SYRP: 5.0000 mL | ORAL_SOLUTION | Freq: Four times a day (QID) | ORAL | 0 refills | Status: DC | PRN

## 2022-10-05 NOTE — Telephone Encounter (Signed)
Who Is Calling Patient / Member / Family / Caregiver Call Type Triage / Clinical Relationship To Patient Self Return Phone Number [phone] (Primary) Chief Complaint CHEST PAIN - pain, pressure, heaviness or tightness Reason for Call Symptomatic / Request for Health Information Initial Comment Caller states she has a 10 am appointment she needs to reschedule. She is sick and has head/chest congestion. She is having chest discomfort. She is requesting medication be called in. Translation No Nurse Assessment Nurse: Peyton Najjar, RN, [facility] Bandy Date/Time Lamount Cohen Time): 10/04/2022 5:37:28 PM Confirm and document reason for call. If symptomatic, describe symptoms. ---Caller states she needs to rescheduled her 10 am appointment for tomorrow. Symptoms include head/ chest congestion. She denies having chest discomfort. Feels like she has a low grade temp and requesting an rx.   Final Disposition 10/04/2022 5:46:14 PM Home Care Yes Peyton Najjar, RN, [facility] Bandy

## 2022-10-05 NOTE — Telephone Encounter (Signed)
Called and spoke with patient advising her that we can still see her regardless of what she has.  She still declined but virtual visit was offered.  She will do virtual visit.  She is not having check pain but is having chest congestion.  She think she may have caught something from her daughter.

## 2022-10-05 NOTE — Progress Notes (Signed)
Virtual telephone visit    Virtual Visit via Telephone Note   This visit type was conducted due to national recommendations for restrictions regarding the COVID-19 Pandemic (e.g. social distancing) in an effort to limit this patient's exposure and mitigate transmission in our community. Due to her co-morbid illnesses, this patient is at least at moderate risk for complications without adequate follow up. This format is felt to be most appropriate for this patient at this time. The patient did not have access to video technology or had technical difficulties with video requiring transitioning to audio format only (telephone). Physical exam was limited to content and character of the telephone converstion. [name] was able to get the patient set up on a telephone visit.   Patient location: Home Patient and provider in visit Provider location: Office  I discussed the limitations of evaluation and management by telemedicine and the availability of in person appointments. The patient expressed understanding and agreed to proceed.   Visit Date: 10/05/2022  Today's healthcare provider: Donato Schultz, DO     Subjective:    Patient ID: [name], female    DOB: Jan 23, 1946, 76 y.o.   MRN: [id number]  Chief Complaint  Patient presents with   Medication Refill    Medication Refill Associated symptoms include congestion (sinus and nasal), coughing (mild), a fever and headaches (sinus headache). Pertinent negatives include no chest pain, myalgias, rash, sore throat or vomiting.   Patient is in today for a telephone visit.   She complains of sinus headache, congestion, coughing up phlegm, mild cough, and a fever of 102.2 degrees F. Her symptoms started Sunday, 10/03/2022. She has not taken a Covid-19 test at home. She denies having any body aches. She is taking tylenol and alka seltzer cold. She does not have Flonase at home. Her cough is keeping her up at night and she is  interested in cough medication to help her symptoms. Her daughter had similar symptoms prior to her developing her symptoms.  She is requesting a refill on Adderall, Imodium, Imitrex, Effexor-XR, clonazepam.    Past Medical History:  Diagnosis Date   Abrasion of right heel    during admission 10/ 2019 right heel open skin due to movement on sheet   ADD (attention deficit disorder)    Anemia, mild    Anxiety    Arthritis    "joints, back"   At high risk for falls    10-19-2018 pt has fell twice in past week, tripped   Chronic back pain    "all over my back" (10/02/2013)   Crushing injury of arm, right 02/06/1989's   "it was crushed; wore cast from fingers to top of my shoulder" (11/25/2   Crushing injury of left wrist 05/11/1989's   DOE (dyspnea on exertion)    Essential tremor    Fibromyalgia    History of palpitations 2002   recurrent   History of panic attacks    History of pneumonia 08/21/2018   w/ acute respiratory failure/ hypoxia   History of sepsis    admission 08-21-2018  secondary to uropathy obstructive from ureteral stone    Hyperlipidemia    Hypertension    10-19-2018 PER PT AMLODIPINE ON HOLD SINCE 10/ 2019 DUE TO KIDNEY ISSUES PER DOCTOR   Idiopathic scoliosis 04/19/2013   Left ureteral stone    Migraines    "once/month" (10/02/2013)   Osteoporosis    Other vitamin B12 deficiency anemia    PONV (postoperative nausea and  vomiting)    As a child   Pulmonary nodules/lesions, multiple    Renal insufficiency    S/P gastric bypass 09/16/2003   S/P insertion of spinal cord stimulator    per pt remote is missing   Wears dentures    upper   Wears glasses    Wears glasses     Past Surgical History:  Procedure Laterality Date   ABDOMINAL HYSTERECTOMY  1980's   W/  BSO  AND APPENDECTOMY   CARDIAC CATHETERIZATION  12-13-2002    dr Eden Emmsnishan   normal coronaries   CYSTOSCOPY WITH URETEROSCOPY AND STENT PLACEMENT Left 09/27/2018   Procedure: LEFT  URETEROSCOPY,  HOLMIUM LASER LITHOTRIPSY;  Surgeon: Crist FatHerrick, Benjamin W, MD;  Location: [facility];  Service: Urology;  Laterality: Left;   D & C HYSTERSCOPY RESECTION MYOMECTOMY  1980s   HUMERUS FRACTURE SURGERY Left 04/17/2019    Comminuted complex left intra-articular distal humerus fracture/supracondylar humerus fracture displaced   INTRAMEDULLARY (IM) NAIL INTERTROCHANTERIC Right 06/01/2019   Procedure: INTRAMEDULLARY (IM) NAIL INTERTROCHANTRIC;  Surgeon: Ollen GrossAluisio, Frank, MD;  Location: [facility];  Service: Orthopedics;  Laterality: Right;   IR NEPHROSTOMY PLACEMENT LEFT  08/22/2018   IR NEPHROSTOMY PLACEMENT LEFT  12/08/2018   KNEE ARTHROSCOPY Right 03-31-2001    dr Simonne Comeaplington @[facility]    NEPHROLITHOTOMY Left 10/20/2018   Procedure: LEFT NEPHROLITHOTOMY PERCUTANEOUS;  Surgeon: Crist FatHerrick, Benjamin W, MD;  Location: [facility];  Service: Urology;  Laterality: Left;   ORIF HUMERUS FRACTURE Left 04/17/2019   Procedure: Open reduction internal fixation left supracondylar humerus fracture with olecranon osteotomy and ulnar nerve release and anterior transposition and repair of structures as necessary;  Surgeon: Dominica SeverinGramig, William, MD;  Location: [facility];  Service: Orthopedics;  Laterality: Left;  3 hrs   REDUCTION MAMMAPLASTY Bilateral 2001   ROBOT ASSISTED LAPAROSCOPIC NEPHRECTOMY Left 12/25/2018   Procedure: XI ROBOTIC ASSISTED LAPAROSCOPIC NEPHRECTOMY, LEFT FLANK EXPLORATION WITH REMOVAL OF BATTERY PACK;  Surgeon: Crist FatHerrick, Benjamin W, MD;  Location: [facility];  Service: Urology;  Laterality: Left;   ROUX-EN-Y GASTRIC BYPASS  09-16-2003   dr Judie Petitm. Daphine Deutschermartin  @[facility]    via Laparoscopy w/ gastrojejunostomy   SPINAL CORD STIMULATOR IMPLANT  2016   10-19-2018  PER PT HAS REMOTE BUT HAS NOT USED IT SINCE DEC 2018   TONSILLECTOMY AND ADENOIDECTOMY  1955   TUBAL LIGATION  1980's    Family History  Adopted: Yes  Problem Relation Age of Onset   COPD Mother    Emphysema Mother    Heart attack Mother    Other Mother        tobacco abuse    Social  History   Socioeconomic History   Marital status: Married    Spouse name: Not on file   Number of children: 2   Years of education: Not on file   Highest education level: Not on file  Occupational History   Occupation: Retired  Tobacco Use   Smoking status: Never   Smokeless tobacco: Never  Vaping Use   Vaping Use: Never used  Substance and Sexual Activity   Alcohol use: No   Drug use: No   Sexual activity: Never    Partners: Male  Other Topics Concern   Not on file  Social History Narrative   Not on file   Social Determinants of Health   Financial Resource Strain: Low Risk  (09/07/2021)   Overall Financial Resource Strain (CARDIA)    Difficulty of Paying Living Expenses: Not hard at all  Food Insecurity:  No Food Insecurity (09/27/2022)   Hunger Vital Sign    Worried About Running Out of Food in the Last Year: Never true    Ran Out of Food in the Last Year: Never true  Transportation Needs: No Transportation Needs (09/27/2022)   PRAPARE - Administrator, Civil Service (Medical): No    Lack of Transportation (Non-Medical): No  Physical Activity: Inactive (09/07/2021)   Exercise Vital Sign    Days of Exercise per Week: 0 days    Minutes of Exercise per Session: 0 min  Stress: No Stress Concern Present (09/07/2021)   [employer] of Occupational Health - Occupational Stress Questionnaire    Feeling of Stress : Not at all  Social Connections: Moderately Isolated (09/07/2021)   Social Connection and Isolation Panel [NHANES]    Frequency of Communication with Friends and Family: More than three times a week    Frequency of Social Gatherings with Friends and Family: Once a week    Attends Religious Services: Never    Database administrator or Organizations: No    Attends Banker Meetings: Never    Marital Status: Married  Catering manager Violence: Not At Risk (09/27/2022)   Humiliation, Afraid, Rape, and Kick questionnaire    Fear of  Current or Ex-Partner: No    Emotionally Abused: No    Physically Abused: No    Sexually Abused: No    Outpatient Medications Prior to Visit  Medication Sig Dispense Refill   losartan (COZAAR) 100 MG tablet Take 1 tablet (100 mg total) by mouth daily. 90 tablet 3   ondansetron (ZOFRAN-ODT) 8 MG disintegrating tablet Take 1 tablet (8 mg total) by mouth every 8 (eight) hours as needed for nausea or vomiting. 12 tablet 2   oxyCODONE (ROXICODONE) 15 MG immediate release tablet Take 1 tablet (15 mg total) by mouth 4 (four) times daily. (Patient taking differently: Take 15 mg by mouth 5 (five) times daily.) 120 tablet 0   promethazine (PHENERGAN) 25 MG suppository Place 1 suppository (25 mg total) rectally every 6 (six) hours as needed for nausea or vomiting. 12 each 0   amphetamine-dextroamphetamine (ADDERALL XR) 20 MG 24 hr capsule 1 po bid 60 capsule 0   clonazePAM (KLONOPIN) 0.5 MG tablet Take 1 tablet (0.5 mg total) by mouth 2 (two) times daily as needed. 60 tablet 0   loperamide (IMODIUM) 2 MG capsule Take 1 capsule (2 mg total) by mouth 4 (four) times daily as needed for diarrhea or loose stools. 12 capsule 2   SUMAtriptan (IMITREX) 50 MG tablet TAKE 1 TABLET BY MOUTH AT ONSET OF HEADACHE; MAY REPEAT 1 TABLET IN 2 HOURS IF NEEDED. *MAX OF 2 DOSES IN 24 HOURS* **APPOINTMENT NEEDED FOR FURTHER REFILLS** 10 tablet 0   venlafaxine XR (EFFEXOR-XR) 75 MG 24 hr capsule TAKE TWO CAPSULES BY MOUTH DAILY 180 capsule 1   pantoprazole (PROTONIX) 20 MG tablet Take 1 tablet (20 mg total) by mouth daily. 30 tablet 0   No facility-administered medications prior to visit.    Allergies  Allergen Reactions   Zanaflex [Tizanidine]     hallucinations   Adhesive [Tape] Itching    Review of Systems  Constitutional:  Positive for fever.  HENT:  Positive for congestion (sinus and nasal) and sinus pain. Negative for sore throat.   Eyes:  Negative for blurred vision.  Respiratory:  Positive for cough (mild)  and sputum production.   Cardiovascular:  Negative for chest pain and palpitations.  Gastrointestinal:  Negative for vomiting.  Musculoskeletal:  Negative for back pain and myalgias.  Skin:  Negative for rash.  Neurological:  Positive for headaches (sinus headache). Negative for loss of consciousness.       Objective:    Physical Exam Vitals and nursing note reviewed.  Neurological:     Mental Status: She is alert.  Psychiatric:        Mood and Affect: Mood normal.        Behavior: Behavior normal.        Thought Content: Thought content normal.    BP (!) 145/75   Temp 100.2 F (37.9 C)   Ht 5\' 5"  (1.651 m)   BMI 23.00 kg/m  Wt Readings from Last 3 Encounters:  02/02/22 138 lb 3.7 oz (62.7 kg)  11/12/21 138 lb 3.2 oz (62.7 kg)  09/07/21 135 lb (61.2 kg)    Diabetic Foot Exam - Simple   No data filed    Lab Results  Component Value Date   WBC 5.1 02/01/2022   HGB 15.3 (H) 02/01/2022   HCT 45.8 02/01/2022   PLT 166 02/01/2022   GLUCOSE 97 02/01/2022   CHOL 159 11/30/2021   TRIG 78.0 11/30/2021   HDL 85.10 11/30/2021   LDLDIRECT 129.1 07/17/2007   LDLCALC 59 11/30/2021   ALT 8 02/01/2022   AST 16 02/01/2022   NA 135 02/01/2022   K 3.5 02/01/2022   CL 99 02/01/2022   CREATININE 0.94 02/01/2022   BUN 9 02/01/2022   CO2 27 02/01/2022   TSH 1.925 08/30/2018   INR 1.0 05/31/2019   HGBA1C 5.3 04/22/2014    Lab Results  Component Value Date   TSH 1.925 08/30/2018   Lab Results  Component Value Date   WBC 5.1 02/01/2022   HGB 15.3 (H) 02/01/2022   HCT 45.8 02/01/2022   MCV 95.6 02/01/2022   PLT 166 02/01/2022   Lab Results  Component Value Date   NA 135 02/01/2022   K 3.5 02/01/2022   CO2 27 02/01/2022   GLUCOSE 97 02/01/2022   BUN 9 02/01/2022   CREATININE 0.94 02/01/2022   BILITOT 0.8 02/01/2022   ALKPHOS 110 02/01/2022   AST 16 02/01/2022   ALT 8 02/01/2022   PROT 7.2 02/01/2022   ALBUMIN 4.2 02/01/2022   CALCIUM 9.6 02/01/2022    ANIONGAP 9 02/01/2022   GFR 53.19 (L) 11/30/2021   Lab Results  Component Value Date   CHOL 159 11/30/2021   Lab Results  Component Value Date   HDL 85.10 11/30/2021   Lab Results  Component Value Date   LDLCALC 59 11/30/2021   Lab Results  Component Value Date   TRIG 78.0 11/30/2021   Lab Results  Component Value Date   CHOLHDL 2 11/30/2021   Lab Results  Component Value Date   HGBA1C 5.3 04/22/2014       Assessment & Plan:   Problem List Items Addressed This Visit       Unprioritized   Nonintractable headache   Relevant Medications   SUMAtriptan (IMITREX) 50 MG tablet   venlafaxine XR (EFFEXOR-XR) 75 MG 24 hr capsule   clonazePAM (KLONOPIN) 0.5 MG tablet   Generalized anxiety disorder   Relevant Medications   venlafaxine XR (EFFEXOR-XR) 75 MG 24 hr capsule   Other Visit Diagnoses     Acute non-recurrent pansinusitis    -  Primary   Relevant Medications   amoxicillin-clavulanate (AUGMENTIN) 875-125 MG tablet   fluticasone (FLONASE) 50 MCG/ACT  nasal spray   promethazine-dextromethorphan (PROMETHAZINE-DM) 6.25-15 MG/5ML syrup   Attention deficit disorder (ADD) without hyperactivity       Relevant Medications   amphetamine-dextroamphetamine (ADDERALL XR) 20 MG 24 hr capsule   Anxiety       Relevant Medications   venlafaxine XR (EFFEXOR-XR) 75 MG 24 hr capsule   clonazePAM (KLONOPIN) 0.5 MG tablet        Meds ordered this encounter  Medications   amphetamine-dextroamphetamine (ADDERALL XR) 20 MG 24 hr capsule    Sig: 1 po bid    Dispense:  60 capsule    Refill:  0   loperamide (IMODIUM) 2 MG capsule    Sig: Take 1 capsule (2 mg total) by mouth 4 (four) times daily as needed for diarrhea or loose stools.    Dispense:  12 capsule    Refill:  2   SUMAtriptan (IMITREX) 50 MG tablet    Sig: TAKE 1 TABLET BY MOUTH AT ONSET OF HEADACHE; MAY REPEAT 1 TABLET IN 2 HOURS IF NEEDED.    Dispense:  10 tablet    Refill:  0   venlafaxine XR (EFFEXOR-XR) 75 MG  24 hr capsule    Sig: Take 2 capsules (150 mg total) by mouth daily.    Dispense:  180 capsule    Refill:  1   amoxicillin-clavulanate (AUGMENTIN) 875-125 MG tablet    Sig: Take 1 tablet by mouth 2 (two) times daily.    Dispense:  20 tablet    Refill:  0   fluticasone (FLONASE) 50 MCG/ACT nasal spray    Sig: Place 2 sprays into both nostrils daily.    Dispense:  16 g    Refill:  6   promethazine-dextromethorphan (PROMETHAZINE-DM) 6.25-15 MG/5ML syrup    Sig: Take 5 mLs by mouth 4 (four) times daily as needed.    Dispense:  118 mL    Refill:  0   clonazePAM (KLONOPIN) 0.5 MG tablet    Sig: Take 1 tablet (0.5 mg total) by mouth 2 (two) times daily as needed.    Dispense:  60 tablet    Refill:  0     I discussed the assessment and treatment plan with the patient. The patient was provided an opportunity to ask questions and all were answered. The patient agreed with the plan and demonstrated an understanding of the instructions.   The patient was advised to call back or seek an in-person evaluation if the symptoms worsen or if the condition fails to improve as anticipated.  I provided 11 minutes of non-face-to-face time during this encounter.   I,Shehryar Baig,acting as a Neurosurgeon for [employer], DO.,have documented all relevant documentation on the behalf of Donato Schultz, DO,as directed by  Donato Schultz, DO while in the presence of Donato Schultz, DO.  Donato Schultz, DO [facility] [phone] (phone) [phone] (fax)  [facility]

## 2022-10-15 ENCOUNTER — Other Ambulatory Visit: Payer: Self-pay | Admitting: Family Medicine

## 2022-10-15 ENCOUNTER — Telehealth: Payer: Self-pay | Admitting: Family Medicine

## 2022-10-15 MED ORDER — AZITHROMYCIN 250 MG PO TABS: ORAL_TABLET | ORAL | 0 refills | Status: DC

## 2022-10-15 NOTE — Telephone Encounter (Signed)
Pt called stating that she feels she might need another round of the antibiotics that she was on due to her feeling her symptoms return  Prescription Request  10/15/2022  Is this a "Controlled Substance" medicine? No  LOV: Visit date not found  What is the name of the medication or equipment?   amoxicillin-clavulanate (AUGMENTIN) 875-125 MG tablet [[id number]]   Have you contacted your pharmacy to request a refill? No   Which pharmacy would you like this sent to?   [id number] - [place], [place] - [address] [place] [address] Phone: [phone] Fax: [phone]  Patient notified that their request is being sent to the clinical staff for review and that they should receive a response within 2 business days.   Please advise at Mobile [phone] (mobile)

## 2022-10-25 ENCOUNTER — Telehealth: Payer: Self-pay | Admitting: Family Medicine

## 2022-10-25 NOTE — Telephone Encounter (Signed)
Patient has her mmg scheduled for tomorrow but wants to know if bone density can be done at the same time. Please call patient to advise if order can be placed.

## 2022-10-26 ENCOUNTER — Other Ambulatory Visit: Payer: Self-pay | Admitting: *Deleted

## 2022-10-26 ENCOUNTER — Inpatient Hospital Stay (HOSPITAL_BASED_OUTPATIENT_CLINIC_OR_DEPARTMENT_OTHER): Admission: RE | Admit: 2022-10-26 | Payer: Medicare PPO | Source: Ambulatory Visit

## 2022-10-26 DIAGNOSIS — Z780 Asymptomatic menopausal state: Secondary | ICD-10-CM

## 2022-10-26 NOTE — Telephone Encounter (Signed)
Dexa ordered.  LMOM notifying pt.

## 2022-10-26 NOTE — Progress Notes (Signed)
Dexa ordered per pt request.

## 2022-10-28 DIAGNOSIS — S82402D Unspecified fracture of shaft of left fibula, subsequent encounter for closed fracture with routine healing: Secondary | ICD-10-CM | POA: Diagnosis not present

## 2022-10-28 DIAGNOSIS — M4120 Other idiopathic scoliosis, site unspecified: Secondary | ICD-10-CM | POA: Diagnosis not present

## 2022-10-28 DIAGNOSIS — M542 Cervicalgia: Secondary | ICD-10-CM | POA: Diagnosis not present

## 2022-10-28 DIAGNOSIS — G894 Chronic pain syndrome: Secondary | ICD-10-CM | POA: Diagnosis not present

## 2022-10-29 ENCOUNTER — Other Ambulatory Visit: Payer: Self-pay | Admitting: Family Medicine

## 2022-10-29 DIAGNOSIS — R519 Headache, unspecified: Secondary | ICD-10-CM

## 2022-10-29 DIAGNOSIS — F419 Anxiety disorder, unspecified: Secondary | ICD-10-CM

## 2022-11-02 MED ORDER — CLONAZEPAM 0.5 MG PO TABS: 0.5000 mg | ORAL_TABLET | Freq: Two times a day (BID) | ORAL | 0 refills | Status: DC | PRN

## 2022-11-02 MED ORDER — SUMATRIPTAN SUCCINATE 50 MG PO TABS: ORAL_TABLET | ORAL | 0 refills | Status: DC

## 2022-11-02 NOTE — Telephone Encounter (Signed)
Requesting: Imitrex, Klonopin Contract: N/A UDS: 10/05/2022 Last Visit: 10/05/2022 Next Visit: N/A Last Refill: 11/228/2023  Please Advise

## 2022-11-09 ENCOUNTER — Other Ambulatory Visit: Payer: Self-pay | Admitting: Family Medicine

## 2022-11-09 DIAGNOSIS — F988 Other specified behavioral and emotional disorders with onset usually occurring in childhood and adolescence: Secondary | ICD-10-CM

## 2022-11-09 MED ORDER — AMPHETAMINE-DEXTROAMPHET ER 20 MG PO CP24: ORAL_CAPSULE | ORAL | 0 refills | Status: DC

## 2022-11-09 NOTE — Telephone Encounter (Signed)
Requesting:adderall 20 mg Contract:11/12/21 UDS:11/12/21 Last Visit:10/02/22 Next Visit:unknown Last Refill:10/05/22  Please Advise

## 2022-11-10 ENCOUNTER — Ambulatory Visit (HOSPITAL_BASED_OUTPATIENT_CLINIC_OR_DEPARTMENT_OTHER): Payer: Medicare PPO

## 2022-11-11 ENCOUNTER — Other Ambulatory Visit (HOSPITAL_BASED_OUTPATIENT_CLINIC_OR_DEPARTMENT_OTHER): Payer: Medicare PPO

## 2022-11-16 ENCOUNTER — Ambulatory Visit (HOSPITAL_BASED_OUTPATIENT_CLINIC_OR_DEPARTMENT_OTHER): Payer: Medicare PPO

## 2022-11-16 ENCOUNTER — Other Ambulatory Visit (HOSPITAL_BASED_OUTPATIENT_CLINIC_OR_DEPARTMENT_OTHER): Payer: Medicare PPO

## 2022-11-23 ENCOUNTER — Ambulatory Visit (HOSPITAL_BASED_OUTPATIENT_CLINIC_OR_DEPARTMENT_OTHER): Payer: Medicare PPO

## 2022-11-23 ENCOUNTER — Other Ambulatory Visit (HOSPITAL_BASED_OUTPATIENT_CLINIC_OR_DEPARTMENT_OTHER): Payer: Medicare PPO

## 2022-11-25 DIAGNOSIS — S82402D Unspecified fracture of shaft of left fibula, subsequent encounter for closed fracture with routine healing: Secondary | ICD-10-CM | POA: Diagnosis not present

## 2022-11-25 DIAGNOSIS — M542 Cervicalgia: Secondary | ICD-10-CM | POA: Diagnosis not present

## 2022-11-25 DIAGNOSIS — Z79891 Long term (current) use of opiate analgesic: Secondary | ICD-10-CM | POA: Diagnosis not present

## 2022-11-25 DIAGNOSIS — G894 Chronic pain syndrome: Secondary | ICD-10-CM | POA: Diagnosis not present

## 2022-11-29 ENCOUNTER — Other Ambulatory Visit (HOSPITAL_BASED_OUTPATIENT_CLINIC_OR_DEPARTMENT_OTHER): Payer: Medicare PPO

## 2022-11-29 ENCOUNTER — Other Ambulatory Visit: Payer: Self-pay | Admitting: Family Medicine

## 2022-11-29 DIAGNOSIS — R519 Headache, unspecified: Secondary | ICD-10-CM

## 2022-12-01 ENCOUNTER — Other Ambulatory Visit: Payer: Self-pay | Admitting: Family Medicine

## 2022-12-01 DIAGNOSIS — F419 Anxiety disorder, unspecified: Secondary | ICD-10-CM

## 2022-12-01 NOTE — Telephone Encounter (Signed)
Requesting: klonopin Contract: 11/12/2021 UDS: 0105/2023 Last OV: 10/05/2022--VV Next OV: N/A Last Refill: 11/02/2022, #60--0 RF Database:   Please advise

## 2022-12-02 MED ORDER — CLONAZEPAM 0.5 MG PO TABS: 0.5000 mg | ORAL_TABLET | Freq: Two times a day (BID) | ORAL | 0 refills | Status: DC | PRN

## 2022-12-18 ENCOUNTER — Other Ambulatory Visit: Payer: Self-pay | Admitting: Family Medicine

## 2022-12-18 DIAGNOSIS — F988 Other specified behavioral and emotional disorders with onset usually occurring in childhood and adolescence: Secondary | ICD-10-CM

## 2022-12-20 MED ORDER — AMPHETAMINE-DEXTROAMPHET ER 20 MG PO CP24: ORAL_CAPSULE | ORAL | 0 refills | Status: DC

## 2022-12-20 NOTE — Telephone Encounter (Signed)
Requesting: Adderall XR 26m  Contract: 11/12/21 UDS: 11/12/21 Last Visit: 10/05/22 Next Visit: None Last Refill: 11/09/22 #60 and 0RF   Please Advise

## 2022-12-27 ENCOUNTER — Other Ambulatory Visit: Payer: Self-pay | Admitting: Family Medicine

## 2022-12-27 DIAGNOSIS — F419 Anxiety disorder, unspecified: Secondary | ICD-10-CM

## 2022-12-29 MED ORDER — CLONAZEPAM 0.5 MG PO TABS: 0.5000 mg | ORAL_TABLET | Freq: Two times a day (BID) | ORAL | 0 refills | Status: DC | PRN

## 2022-12-29 NOTE — Telephone Encounter (Signed)
Requesting: clonazepam 0.43m  Contract:11/12/21 UDS:11/12/21 Last Visit: 10/05/22 Next Visit: None Last Refill: 12/02/22 #60 and 0RF   Please Advise

## 2022-12-30 DIAGNOSIS — Z79891 Long term (current) use of opiate analgesic: Secondary | ICD-10-CM | POA: Diagnosis not present

## 2022-12-30 DIAGNOSIS — M4120 Other idiopathic scoliosis, site unspecified: Secondary | ICD-10-CM | POA: Diagnosis not present

## 2022-12-30 DIAGNOSIS — M542 Cervicalgia: Secondary | ICD-10-CM | POA: Diagnosis not present

## 2022-12-30 DIAGNOSIS — Z79899 Other long term (current) drug therapy: Secondary | ICD-10-CM | POA: Diagnosis not present

## 2022-12-30 DIAGNOSIS — G894 Chronic pain syndrome: Secondary | ICD-10-CM | POA: Diagnosis not present

## 2023-01-06 ENCOUNTER — Telehealth: Payer: Self-pay | Admitting: Family Medicine

## 2023-01-06 NOTE — Telephone Encounter (Signed)
Copied from [place] [id number]. Topic: Medicare AWV >> Jan 06, 2023  3:53 PM Devoria Glassing wrote: Reason for CRM: Called patient to schedule Medicare Annual Wellness Visit (AWV). Left message for patient to call back and schedule Medicare Annual Wellness Visit (AWV).  Last date of AWV: 09/07/2021   Please schedule an appointment at any time with [facility].  If any questions, please contact me.  Thank you ,  Sherol Dade; [place] Direct Dial: [phone]

## 2023-01-21 ENCOUNTER — Other Ambulatory Visit: Payer: Self-pay | Admitting: Family Medicine

## 2023-01-21 DIAGNOSIS — F988 Other specified behavioral and emotional disorders with onset usually occurring in childhood and adolescence: Secondary | ICD-10-CM

## 2023-01-21 DIAGNOSIS — J0140 Acute pansinusitis, unspecified: Secondary | ICD-10-CM

## 2023-01-21 DIAGNOSIS — R519 Headache, unspecified: Secondary | ICD-10-CM

## 2023-01-21 MED ORDER — PROMETHAZINE-DM 6.25-15 MG/5ML PO SYRP: 5.0000 mL | ORAL_SOLUTION | Freq: Four times a day (QID) | ORAL | 0 refills | Status: DC | PRN

## 2023-01-21 MED ORDER — AMPHETAMINE-DEXTROAMPHET ER 20 MG PO CP24: ORAL_CAPSULE | ORAL | 0 refills | Status: DC

## 2023-01-21 MED ORDER — SUMATRIPTAN SUCCINATE 50 MG PO TABS: ORAL_TABLET | ORAL | 3 refills | Status: DC

## 2023-01-21 NOTE — Telephone Encounter (Signed)
Requesting: Adderall XR 20mg   Contract:11/12/21 UDS: 11/12/21 Last Visit: 10/05/22 Next Visit: None Last Refill: 12/20/22 #60 and 0RF   Please Advise

## 2023-01-21 NOTE — Telephone Encounter (Signed)
Requesting: promethazine-dm  Contract: n/a UDS: n/a Last Visit: 10/05/22 Next Visit: None Last Refill: 10/05/22 #181mL and 0RF  Please Advise

## 2023-01-23 ENCOUNTER — Other Ambulatory Visit: Payer: Self-pay | Admitting: Family Medicine

## 2023-01-27 DIAGNOSIS — M4120 Other idiopathic scoliosis, site unspecified: Secondary | ICD-10-CM | POA: Diagnosis not present

## 2023-01-27 DIAGNOSIS — M542 Cervicalgia: Secondary | ICD-10-CM | POA: Diagnosis not present

## 2023-01-27 DIAGNOSIS — G894 Chronic pain syndrome: Secondary | ICD-10-CM | POA: Diagnosis not present

## 2023-02-03 ENCOUNTER — Other Ambulatory Visit: Payer: Self-pay | Admitting: Family Medicine

## 2023-02-03 DIAGNOSIS — F419 Anxiety disorder, unspecified: Secondary | ICD-10-CM

## 2023-02-07 MED ORDER — CLONAZEPAM 0.5 MG PO TABS: 0.5000 mg | ORAL_TABLET | Freq: Two times a day (BID) | ORAL | 0 refills | Status: DC | PRN

## 2023-02-07 NOTE — Telephone Encounter (Signed)
Requesting: clonazepam 0.5mg   Contract: 11/12/21 UDS: 11/12/21 Last Visit: 10/05/22 Next Visit: None Last Refill:  12/29/22 #60 and 0RF   Please Advise

## 2023-02-22 DIAGNOSIS — R8271 Bacteriuria: Secondary | ICD-10-CM | POA: Diagnosis not present

## 2023-02-22 DIAGNOSIS — R1032 Left lower quadrant pain: Secondary | ICD-10-CM | POA: Diagnosis not present

## 2023-02-22 DIAGNOSIS — N3000 Acute cystitis without hematuria: Secondary | ICD-10-CM | POA: Diagnosis not present

## 2023-02-22 DIAGNOSIS — N182 Chronic kidney disease, stage 2 (mild): Secondary | ICD-10-CM | POA: Diagnosis not present

## 2023-03-08 DIAGNOSIS — M542 Cervicalgia: Secondary | ICD-10-CM | POA: Diagnosis not present

## 2023-03-08 DIAGNOSIS — M4120 Other idiopathic scoliosis, site unspecified: Secondary | ICD-10-CM | POA: Diagnosis not present

## 2023-03-08 DIAGNOSIS — G894 Chronic pain syndrome: Secondary | ICD-10-CM | POA: Diagnosis not present

## 2023-03-11 DIAGNOSIS — R1032 Left lower quadrant pain: Secondary | ICD-10-CM | POA: Diagnosis not present

## 2023-03-11 DIAGNOSIS — R109 Unspecified abdominal pain: Secondary | ICD-10-CM | POA: Diagnosis not present

## 2023-03-24 ENCOUNTER — Other Ambulatory Visit: Payer: Self-pay | Admitting: Family Medicine

## 2023-03-24 DIAGNOSIS — F988 Other specified behavioral and emotional disorders with onset usually occurring in childhood and adolescence: Secondary | ICD-10-CM

## 2023-03-24 DIAGNOSIS — F419 Anxiety disorder, unspecified: Secondary | ICD-10-CM

## 2023-03-24 NOTE — Telephone Encounter (Signed)
Requesting: adderall Contract: 11/12/21 UDS:11/12/21 Last Visit:10/05/22 Next Visit:n/a Last Refill:01/21/23  Please Advise

## 2023-03-24 NOTE — Telephone Encounter (Signed)
Requesting: klonopin  Contract:11/12/21 UDS:11/12/21 Last Visit:10/05/22 Next Visit:n/a Last Refill:02/07/23  Please Advise

## 2023-03-25 MED ORDER — AMPHETAMINE-DEXTROAMPHET ER 20 MG PO CP24: ORAL_CAPSULE | ORAL | 0 refills | Status: DC

## 2023-03-25 MED ORDER — CLONAZEPAM 0.5 MG PO TABS: 0.5000 mg | ORAL_TABLET | Freq: Two times a day (BID) | ORAL | 0 refills | Status: DC | PRN

## 2023-04-15 DIAGNOSIS — G894 Chronic pain syndrome: Secondary | ICD-10-CM | POA: Diagnosis not present

## 2023-04-15 DIAGNOSIS — M4120 Other idiopathic scoliosis, site unspecified: Secondary | ICD-10-CM | POA: Diagnosis not present

## 2023-04-15 DIAGNOSIS — M810 Age-related osteoporosis without current pathological fracture: Secondary | ICD-10-CM | POA: Diagnosis not present

## 2023-04-15 DIAGNOSIS — M542 Cervicalgia: Secondary | ICD-10-CM | POA: Diagnosis not present

## 2023-04-22 ENCOUNTER — Other Ambulatory Visit: Payer: Self-pay | Admitting: Family Medicine

## 2023-04-22 DIAGNOSIS — F419 Anxiety disorder, unspecified: Secondary | ICD-10-CM

## 2023-04-22 MED ORDER — CLONAZEPAM 0.5 MG PO TABS: 0.5000 mg | ORAL_TABLET | Freq: Two times a day (BID) | ORAL | 0 refills | Status: DC | PRN

## 2023-04-22 NOTE — Telephone Encounter (Signed)
Requesting: Klonopin Contract: N/A UDS: 11/12/2021 Last Visit: 10/05/2022 Next Visit: N/A Last Refill: 03/25/2023   Please Advise

## 2023-05-13 ENCOUNTER — Telehealth: Payer: Self-pay | Admitting: Family Medicine

## 2023-05-13 NOTE — Telephone Encounter (Signed)
Pt states she is having a very hard time dealing with her husband's health issues and is wondering if pcp could up her KLONOPIN dosage. Please advise.   [facility] [id number] - [place], [place] - [address], [place] [address] Phone: [phone]  Fax: [phone]

## 2023-05-15 ENCOUNTER — Other Ambulatory Visit: Payer: Self-pay | Admitting: Family Medicine

## 2023-05-15 ENCOUNTER — Other Ambulatory Visit: Payer: Self-pay | Admitting: Family

## 2023-05-15 DIAGNOSIS — F419 Anxiety disorder, unspecified: Secondary | ICD-10-CM

## 2023-05-15 DIAGNOSIS — J0140 Acute pansinusitis, unspecified: Secondary | ICD-10-CM

## 2023-05-15 DIAGNOSIS — R519 Headache, unspecified: Secondary | ICD-10-CM

## 2023-05-16 ENCOUNTER — Other Ambulatory Visit: Payer: Self-pay | Admitting: Family Medicine

## 2023-05-16 DIAGNOSIS — F419 Anxiety disorder, unspecified: Secondary | ICD-10-CM

## 2023-05-16 DIAGNOSIS — F411 Generalized anxiety disorder: Secondary | ICD-10-CM

## 2023-05-16 MED ORDER — CLONAZEPAM 0.5 MG PO TABS
0.5000 mg | ORAL_TABLET | Freq: Three times a day (TID) | ORAL | 1 refills | Status: DC | PRN
Start: 2023-05-16 — End: 2023-07-25

## 2023-05-16 NOTE — Telephone Encounter (Signed)
OV

## 2023-05-16 NOTE — Telephone Encounter (Signed)
Pt made aware. Pt has OV tomorrow

## 2023-05-17 ENCOUNTER — Ambulatory Visit: Payer: Medicare PPO | Admitting: Family Medicine

## 2023-05-19 DIAGNOSIS — G894 Chronic pain syndrome: Secondary | ICD-10-CM | POA: Diagnosis not present

## 2023-05-20 ENCOUNTER — Encounter: Payer: Self-pay | Admitting: Family Medicine

## 2023-05-20 ENCOUNTER — Ambulatory Visit: Payer: Medicare PPO | Admitting: Family Medicine

## 2023-05-20 VITALS — BP 98/70 | HR 110 | Temp 98.1°F | Resp 16 | Ht 65.0 in

## 2023-05-20 DIAGNOSIS — F411 Generalized anxiety disorder: Secondary | ICD-10-CM

## 2023-05-20 DIAGNOSIS — F1123 Opioid dependence with withdrawal: Secondary | ICD-10-CM

## 2023-05-20 DIAGNOSIS — E782 Mixed hyperlipidemia: Secondary | ICD-10-CM | POA: Diagnosis not present

## 2023-05-20 DIAGNOSIS — E440 Moderate protein-calorie malnutrition: Secondary | ICD-10-CM

## 2023-05-20 DIAGNOSIS — D696 Thrombocytopenia, unspecified: Secondary | ICD-10-CM

## 2023-05-20 DIAGNOSIS — I10 Essential (primary) hypertension: Secondary | ICD-10-CM | POA: Diagnosis not present

## 2023-05-20 DIAGNOSIS — F988 Other specified behavioral and emotional disorders with onset usually occurring in childhood and adolescence: Secondary | ICD-10-CM

## 2023-05-20 DIAGNOSIS — R300 Dysuria: Secondary | ICD-10-CM

## 2023-05-20 DIAGNOSIS — J9601 Acute respiratory failure with hypoxia: Secondary | ICD-10-CM | POA: Diagnosis not present

## 2023-05-20 DIAGNOSIS — I824Y3 Acute embolism and thrombosis of unspecified deep veins of proximal lower extremity, bilateral: Secondary | ICD-10-CM | POA: Insufficient documentation

## 2023-05-20 LAB — CBC WITH DIFFERENTIAL/PLATELET
Basophils Absolute: 0 10*3/uL (ref 0.0–0.1)
Basophils Relative: 0.8 % (ref 0.0–3.0)
Eosinophils Absolute: 0.2 10*3/uL (ref 0.0–0.7)
Eosinophils Relative: 4.4 % (ref 0.0–5.0)
HCT: 38.3 % (ref 36.0–46.0)
Hemoglobin: 12.4 g/dL (ref 12.0–15.0)
Lymphocytes Relative: 43.9 % (ref 12.0–46.0)
Lymphs Abs: 1.8 10*3/uL (ref 0.7–4.0)
MCHC: 32.4 g/dL (ref 30.0–36.0)
MCV: 96.1 fl (ref 78.0–100.0)
Monocytes Absolute: 0.5 10*3/uL (ref 0.1–1.0)
Monocytes Relative: 12 % (ref 3.0–12.0)
Neutro Abs: 1.6 10*3/uL (ref 1.4–7.7)
Neutrophils Relative %: 38.9 % — ABNORMAL LOW (ref 43.0–77.0)
Platelets: 147 10*3/uL — ABNORMAL LOW (ref 150.0–400.0)
RBC: 3.99 Mil/uL (ref 3.87–5.11)
RDW: 13.4 % (ref 11.5–15.5)
WBC: 4.2 10*3/uL (ref 4.0–10.5)

## 2023-05-20 LAB — LIPID PANEL
Cholesterol: 162 mg/dL (ref 0–200)
HDL: 66.1 mg/dL (ref >39.00)
LDL Cholesterol: 79 mg/dL (ref 0–99)
NonHDL: 96.23
Total CHOL/HDL Ratio: 2
Triglycerides: 86 mg/dL (ref 0.0–149.0)
VLDL: 17.2 mg/dL (ref 0.0–40.0)

## 2023-05-20 LAB — COMPREHENSIVE METABOLIC PANEL
ALT: 6 U/L (ref 0–35)
AST: 16 U/L (ref 0–37)
Albumin: 4.1 g/dL (ref 3.5–5.2)
Alkaline Phosphatase: 130 U/L — ABNORMAL HIGH (ref 39–117)
BUN: 36 mg/dL — ABNORMAL HIGH (ref 6–23)
CO2: 29 mEq/L (ref 19–32)
Calcium: 9 mg/dL (ref 8.4–10.5)
Chloride: 103 mEq/L (ref 96–112)
Creatinine, Ser: 1.57 mg/dL — ABNORMAL HIGH (ref 0.40–1.20)
GFR: 32.12 mL/min — ABNORMAL LOW (ref >60.00)
Glucose, Bld: 95 mg/dL (ref 70–99)
Potassium: 4.4 mEq/L (ref 3.5–5.1)
Sodium: 140 mEq/L (ref 135–145)
Total Bilirubin: 0.5 mg/dL (ref 0.2–1.2)
Total Protein: 7 g/dL (ref 6.0–8.3)

## 2023-05-20 LAB — TSH: TSH: 1.45 u[IU]/mL (ref 0.35–5.50)

## 2023-05-20 MED ORDER — LOSARTAN POTASSIUM 50 MG PO TABS
50.0000 mg | ORAL_TABLET | Freq: Every day | ORAL | 1 refills | Status: DC
Start: 2023-05-20 — End: 2023-11-14

## 2023-05-20 MED ORDER — ONDANSETRON 8 MG PO TBDP: 8.0000 mg | ORAL_TABLET | Freq: Three times a day (TID) | ORAL | 2 refills | Status: DC | PRN

## 2023-05-20 MED ORDER — LOPERAMIDE HCL 2 MG PO CAPS: 2.0000 mg | ORAL_CAPSULE | ORAL | 2 refills | Status: DC | PRN

## 2023-05-20 NOTE — Assessment & Plan Note (Signed)
Stable

## 2023-05-20 NOTE — Assessment & Plan Note (Signed)
LOW TODAY Cut losartan in half

## 2023-05-20 NOTE — Progress Notes (Signed)
Established Patient Office Visit  Subjective   Patient ID: [name], female    DOB: 07/13/1946  Age: 77 y.o. MRN: [id number]  Chief Complaint  Patient presents with   ADD   Follow-up    HPI Discussed the use of AI scribe software for clinical note transcription with the patient, who gave verbal consent to proceed.  History of Present Illness   The patient, with a history of hypertension, presents for medication refills. She reports that her blood pressure has been fluctuating due to stress related to her spouse's illness. She has been taking clonazepam to help manage her anxiety and to aid in sleep. However, she ran out of medication earlier than expected and has been taking small pieces of the remaining pills. She also reports that her blood pressure has been running low, with a reading of 98/70 in the office today.  In addition to her blood pressure concerns, the patient has been experiencing diarrhea. She has been in contact with her surgeon, Dr. Marlou Porch, who recommended over-the-counter Imodium and an antibiotic. She also reports that her headaches have stopped.  The patient also mentions a urinary tract infection (UTI), but it is unclear if this is a current issue or a past problem. She reports some burning during urination and frequency.      Patient Active Problem List   Diagnosis Date Noted   Opioid dependence with withdrawal (HCC) 05/20/2023   Deep vein thrombosis (DVT) of proximal vein of both lower extremities, unspecified chronicity (HCC) 05/20/2023   Elevated BP without diagnosis of hypertension 11/12/2021   Dysuria 10/16/2019   Suspected COVID-19 virus infection 10/16/2019   Closed nondisplaced intertrochanteric fracture of right femur (HCC) 05/31/2019   Closed displaced comminuted supracondylar fracture of left humerus without intercondylar fracture 04/17/2019   Nonfunctioning kidney 12/25/2018   Hydronephrosis with ureteral stricture, not elsewhere classified  12/21/2018   Malnutrition of moderate degree 12/08/2018   Delirium 12/07/2018   Hydronephrosis of left kidney 12/07/2018   Pyelonephritis 12/07/2018   Protein-calorie malnutrition, severe 08/24/2018   Pressure injury of skin 08/22/2018   Obstruction of left ureteropelvic junction (UPJ) due to stone 08/21/2018   Hypoalbuminemia 08/21/2018   Chronic pain disorder 08/21/2018   Thrombocytopenia (HCC) 08/21/2018   Acute renal failure (ARF) (HCC) 08/21/2018   Compression fracture of spine (HCC) 08/21/2018   Acute respiratory failure with hypoxemia (HCC)    Hematoma 06/03/2018   Dysphagia 02/07/2018   Generalized anxiety disorder 07/04/2017   Nonintractable headache 07/04/2017   Exertional dyspnea 08/05/2016   Sepsis (HCC) 03/07/2015   Hypokalemia 03/07/2015   Acute encephalopathy 03/06/2015   Fever 03/06/2015   Chronic back pain 01/15/2015   Obesity (BMI 30-39.9) 10/15/2013   Pulmonary nodules 10/15/2013   CAP (community acquired pneumonia) 10/02/2013   Essential hypertension 08/24/2013   Idiopathic scoliosis 04/19/2013   Back pain 04/19/2013   Osteoporosis 04/19/2013   Edema 04/19/2013   Mild diastolic dysfunction 01/01/2013   Leg pain, bilateral 12/16/2012   Attention deficit disorder (ADD) in adult 05/24/2012   Involuntary movements 05/24/2012   DIZZINESS 12/31/2009   TREMOR, ESSENTIAL 12/12/2009   PALPITATIONS, RECURRENT 12/12/2009   OTHER VITAMIN B12 DEFICIENCY ANEMIA 10/30/2009   FEMALE STRESS INCONTINENCE 10/28/2009   MEMORY LOSS 10/28/2009   GERD 12/03/2008   ANEMIA, MILD 06/26/2008   DEPRESSION/ANXIETY 12/28/2007   PICA 12/28/2007   ACUTE BRONCHITIS 12/28/2007   FATIGUE 12/28/2007   Hyperlipidemia 05/15/2007   ARTIFICIAL MENOPAUSE 05/15/2007   Myalgia and myositis, unspecified 05/15/2007  HEADACHE 05/15/2007   CHEST PAIN, ATYPICAL 05/15/2007   SYMPTOM, PAIN, ABDOMINAL, EPIGASTRIC 05/15/2007   REDUCTION MAMMOPLASTY, HX OF 05/15/2007   PSTPRC STATUS, BARIATRIC  SURGERY 05/15/2007   Past Medical History:  Diagnosis Date   Abrasion of right heel    during admission 10/ 2019 right heel open skin due to movement on sheet   ADD (attention deficit disorder)    Anemia, mild    Anxiety    Arthritis    "joints, back"   At high risk for falls    10-19-2018 pt has fell twice in past week, tripped   Chronic back pain    "all over my back" (10/02/2013)   Crushing injury of arm, right 02/06/1989's   "it was crushed; wore cast from fingers to top of my shoulder" (11/25/2   Crushing injury of left wrist 05/11/1989's   DOE (dyspnea on exertion)    Essential tremor    Fibromyalgia    History of palpitations 2002   recurrent   History of panic attacks    History of pneumonia 08/21/2018   w/ acute respiratory failure/ hypoxia   History of sepsis    admission 08-21-2018  secondary to uropathy obstructive from ureteral stone    Hyperlipidemia    Hypertension    10-19-2018 PER PT AMLODIPINE ON HOLD SINCE 10/ 2019 DUE TO KIDNEY ISSUES PER DOCTOR   Idiopathic scoliosis 04/19/2013   Left ureteral stone    Migraines    "once/month" (10/02/2013)   Osteoporosis    Other vitamin B12 deficiency anemia    PONV (postoperative nausea and vomiting)    As a child   Pulmonary nodules/lesions, multiple    Renal insufficiency    S/P gastric bypass 09/16/2003   S/P insertion of spinal cord stimulator    per pt remote is missing   Wears dentures    upper   Wears glasses    Wears glasses    Past Surgical History:  Procedure Laterality Date   ABDOMINAL HYSTERECTOMY  1980's   W/  BSO  AND APPENDECTOMY   CARDIAC CATHETERIZATION  12-13-2002    dr Eden Emms   normal coronaries   CYSTOSCOPY WITH URETEROSCOPY AND STENT PLACEMENT Left 09/27/2018   Procedure: LEFT  URETEROSCOPY, HOLMIUM LASER LITHOTRIPSY;  Surgeon: Crist Fat, MD;  Location: [facility];  Service: Urology;  Laterality: Left;   D & C HYSTERSCOPY RESECTION MYOMECTOMY  1980s   HUMERUS FRACTURE SURGERY Left  04/17/2019    Comminuted complex left intra-articular distal humerus fracture/supracondylar humerus fracture displaced   INTRAMEDULLARY (IM) NAIL INTERTROCHANTERIC Right 06/01/2019   Procedure: INTRAMEDULLARY (IM) NAIL INTERTROCHANTRIC;  Surgeon: Ollen Gross, MD;  Location: [facility];  Service: Orthopedics;  Laterality: Right;   IR NEPHROSTOMY PLACEMENT LEFT  08/22/2018   IR NEPHROSTOMY PLACEMENT LEFT  12/08/2018   KNEE ARTHROSCOPY Right 03-31-2001    dr Simonne Come @[facility]    NEPHROLITHOTOMY Left 10/20/2018   Procedure: LEFT NEPHROLITHOTOMY PERCUTANEOUS;  Surgeon: Crist Fat, MD;  Location: [facility];  Service: Urology;  Laterality: Left;   ORIF HUMERUS FRACTURE Left 04/17/2019   Procedure: Open reduction internal fixation left supracondylar humerus fracture with olecranon osteotomy and ulnar nerve release and anterior transposition and repair of structures as necessary;  Surgeon: Dominica Severin, MD;  Location: [facility];  Service: Orthopedics;  Laterality: Left;  3 hrs   REDUCTION MAMMAPLASTY Bilateral 2001   ROBOT ASSISTED LAPAROSCOPIC NEPHRECTOMY Left 12/25/2018   Procedure: XI ROBOTIC ASSISTED LAPAROSCOPIC NEPHRECTOMY, LEFT FLANK EXPLORATION WITH REMOVAL OF  BATTERY PACK;  Surgeon: Crist Fat, MD;  Location: [facility];  Service: Urology;  Laterality: Left;   ROUX-EN-Y GASTRIC BYPASS  09-16-2003   dr Judie Petit. Daphine Deutscher  @[facility]    via Laparoscopy w/ gastrojejunostomy   SPINAL CORD STIMULATOR IMPLANT  2016   10-19-2018  PER PT HAS REMOTE BUT HAS NOT USED IT SINCE DEC 2018   TONSILLECTOMY AND ADENOIDECTOMY  1955   TUBAL LIGATION  1980's   Social History   Tobacco Use   Smoking status: Never   Smokeless tobacco: Never  Vaping Use   Vaping status: Never Used  Substance Use Topics   Alcohol use: No   Drug use: No   Social History   Socioeconomic History   Marital status: Married    Spouse name: Not on file   Number of children: 2   Years of education: Not on file   Highest education level: Not  on file  Occupational History   Occupation: Retired  Tobacco Use   Smoking status: Never   Smokeless tobacco: Never  Vaping Use   Vaping status: Never Used  Substance and Sexual Activity   Alcohol use: No   Drug use: No   Sexual activity: Never    Partners: Male  Other Topics Concern   Not on file  Social History Narrative   Not on file   Social Determinants of Health   Financial Resource Strain: Low Risk  (09/07/2021)   Overall Financial Resource Strain (CARDIA)    Difficulty of Paying Living Expenses: Not hard at all  Food Insecurity: No Food Insecurity (09/27/2022)   Hunger Vital Sign    Worried About Running Out of Food in the Last Year: Never true    Ran Out of Food in the Last Year: Never true  Transportation Needs: No Transportation Needs (09/27/2022)   PRAPARE - Administrator, Civil Service (Medical): No    Lack of Transportation (Non-Medical): No  Physical Activity: Inactive (09/07/2021)   Exercise Vital Sign    Days of Exercise per Week: 0 days    Minutes of Exercise per Session: 0 min  Stress: No Stress Concern Present (09/07/2021)   [employer] of Occupational Health - Occupational Stress Questionnaire    Feeling of Stress : Not at all  Social Connections: Moderately Isolated (09/07/2021)   Social Connection and Isolation Panel [NHANES]    Frequency of Communication with Friends and Family: More than three times a week    Frequency of Social Gatherings with Friends and Family: Once a week    Attends Religious Services: Never    Database administrator or Organizations: No    Attends Banker Meetings: Never    Marital Status: Married  Catering manager Violence: Not At Risk (09/27/2022)   Humiliation, Afraid, Rape, and Kick questionnaire    Fear of Current or Ex-Partner: No    Emotionally Abused: No    Physically Abused: No    Sexually Abused: No   Family Status  Relation Name Status   Mother  Deceased at age 15s        Met her birth mom later in life (Cause of death - MI)  No partnership data on file   Family History  Adopted: Yes  Problem Relation Age of Onset   COPD Mother    Emphysema Mother    Heart attack Mother    Other Mother        tobacco abuse   Allergies  Allergen Reactions  Zanaflex [Tizanidine]     hallucinations   Adhesive [Tape] Itching      Review of Systems  Constitutional:  Negative for fever.  HENT:  Negative for congestion.   Eyes:  Negative for blurred vision.  Respiratory:  Negative for cough.   Cardiovascular:  Negative for chest pain and palpitations.  Gastrointestinal:  Negative for vomiting.  Musculoskeletal:  Positive for back pain.  Skin:  Negative for rash.  Neurological:  Negative for loss of consciousness and headaches.      Objective:     BP 98/70 (BP Location: Left Arm, Patient Position: Sitting, Cuff Size: Normal)   Pulse (!) 110   Temp 98.1 F (36.7 C) (Oral)   Resp 16   Ht 5\' 5"  (1.651 m)   BMI 23.00 kg/m  BP Readings from Last 3 Encounters:  05/20/23 98/70  10/05/22 (!) 145/75  02/02/22 (!) 171/107   Wt Readings from Last 3 Encounters:  02/02/22 138 lb 3.7 oz (62.7 kg)  11/12/21 138 lb 3.2 oz (62.7 kg)  09/07/21 135 lb (61.2 kg)   SpO2 Readings from Last 3 Encounters:  02/02/22 96%  02/01/22 98%  11/30/21 95%      Physical Exam Vitals and nursing note reviewed.  Constitutional:      General: She is not in acute distress.    Appearance: Normal appearance. She is well-developed.  HENT:     Head: Normocephalic and atraumatic.  Eyes:     General: No scleral icterus.       Right eye: No discharge.        Left eye: No discharge.  Cardiovascular:     Rate and Rhythm: Normal rate and regular rhythm.     Heart sounds: No murmur heard. Pulmonary:     Effort: Pulmonary effort is normal. No respiratory distress.     Breath sounds: Normal breath sounds.  Musculoskeletal:        General: Tenderness present. Normal range of  motion.     Cervical back: Normal range of motion and neck supple.     Right lower leg: No edema.     Left lower leg: No edema.  Skin:    General: Skin is warm and dry.  Neurological:     Mental Status: She is alert and oriented to person, place, and time.  Psychiatric:        Mood and Affect: Mood normal.        Behavior: Behavior normal.        Thought Content: Thought content normal.        Judgment: Judgment normal.      No results found for any visits on 05/20/23.  Last CBC Lab Results  Component Value Date   WBC 5.1 02/01/2022   HGB 15.3 (H) 02/01/2022   HCT 45.8 02/01/2022   MCV 95.6 02/01/2022   MCH 31.9 02/01/2022   RDW 13.8 02/01/2022   PLT 166 02/01/2022   Last metabolic panel Lab Results  Component Value Date   GLUCOSE 97 02/01/2022   NA 135 02/01/2022   K 3.5 02/01/2022   CL 99 02/01/2022   CO2 27 02/01/2022   BUN 9 02/01/2022   CREATININE 0.94 02/01/2022   GFRNONAA >60 02/01/2022   CALCIUM 9.6 02/01/2022   PHOS 3.2 12/10/2018   PROT 7.2 02/01/2022   ALBUMIN 4.2 02/01/2022   BILITOT 0.8 02/01/2022   ALKPHOS 110 02/01/2022   AST 16 02/01/2022   ALT 8 02/01/2022   ANIONGAP 9 02/01/2022  Last lipids Lab Results  Component Value Date   CHOL 159 11/30/2021   HDL 85.10 11/30/2021   LDLCALC 59 11/30/2021   LDLDIRECT 129.1 07/17/2007   TRIG 78.0 11/30/2021   CHOLHDL 2 11/30/2021   Last hemoglobin A1c Lab Results  Component Value Date   HGBA1C 5.3 04/22/2014   Last thyroid functions Lab Results  Component Value Date   TSH 1.925 08/30/2018   Last vitamin D Lab Results  Component Value Date   VD25OH 20.8 (L) 06/01/2019   Last vitamin B12 and Folate Lab Results  Component Value Date   VITAMINB12 572 06/03/2019   FOLATE 11.0 10/28/2009      The 10-year ASCVD risk score (Arnett DK, et al., 2019) is: 12.7%    Assessment & Plan:   Problem List Items Addressed This Visit       Unprioritized   Malnutrition of moderate degree    Dysuria   Relevant Orders   POCT Urinalysis Dipstick (Automated)   Opioid dependence with withdrawal (HCC)   RESOLVED: Morbid obesity (HCC)   Hyperlipidemia    Encourage heart healthy diet such as MIND or DASH diet, increase exercise, avoid trans fats, simple carbohydrates and processed foods, consider a krill or fish or flaxseed oil cap daily.        Relevant Medications   losartan (COZAAR) 50 MG tablet   Other Relevant Orders   CBC with Differential/Platelet   Comprehensive metabolic panel   Lipid panel   Generalized anxiety disorder - Primary    Stable        Relevant Orders   CBC with Differential/Platelet   Comprehensive metabolic panel   Lipid panel   TSH   Essential hypertension    LOW TODAY Cut losartan in half      Relevant Medications   losartan (COZAAR) 50 MG tablet   Deep vein thrombosis (DVT) of proximal vein of both lower extremities, unspecified chronicity (HCC)   Relevant Medications   losartan (COZAAR) 50 MG tablet   Attention deficit disorder (ADD) in adult    STABLE ON ADDERALL      Other Visit Diagnoses     Primary hypertension       Relevant Medications   losartan (COZAAR) 50 MG tablet   Other Relevant Orders   CBC with Differential/Platelet   Comprehensive metabolic panel   Lipid panel   Acute respiratory failure with hypoxia (HCC)   (Chronic)     Thrombocytopenia, unspecified (HCC)   (Chronic)         No follow-ups on file.    Donato Schultz, DO

## 2023-05-20 NOTE — Assessment & Plan Note (Signed)
STABLE ON ADDERALL

## 2023-05-20 NOTE — Assessment & Plan Note (Signed)
Encourage heart healthy diet such as MIND or DASH diet, increase exercise, avoid trans fats, simple carbohydrates and processed foods, consider a krill or fish or flaxseed oil cap daily.  °

## 2023-05-22 ENCOUNTER — Other Ambulatory Visit: Payer: Self-pay | Admitting: Family Medicine

## 2023-05-22 DIAGNOSIS — F411 Generalized anxiety disorder: Secondary | ICD-10-CM

## 2023-05-23 ENCOUNTER — Other Ambulatory Visit: Payer: Self-pay | Admitting: Family Medicine

## 2023-05-23 DIAGNOSIS — F988 Other specified behavioral and emotional disorders with onset usually occurring in childhood and adolescence: Secondary | ICD-10-CM

## 2023-05-23 MED ORDER — AMPHETAMINE-DEXTROAMPHET ER 20 MG PO CP24
ORAL_CAPSULE | ORAL | 0 refills | Status: DC
Start: 2023-05-23 — End: 2023-07-04

## 2023-05-23 NOTE — Addendum Note (Signed)
Addended by: Roxanne Gates on: 05/23/2023 03:48 PM   Modules accepted: Orders

## 2023-05-23 NOTE — Telephone Encounter (Signed)
Requesting: Adderall XR Contract: 11/12/21 UDS: 11/12/21 Last OV: 05/20/23 Next OV: 11/21/23 Last Refill: 03/25/23, #[id number] RF Database:   Please advise

## 2023-05-23 NOTE — Telephone Encounter (Signed)
Prescription Request  05/23/2023  Is this a "Controlled Substance" medicine? Yes  LOV: 05/20/2023  What is the name of the medication or equipment? amphetamine-dextroamphetamine (ADDERALL XR) 20 MG 24 hr capsule [[id number]   Have you contacted your pharmacy to request a refill? No   Which pharmacy would you like this sent to?  [facility] [id number] - [place], [place] - [address] [place] [address] Phone: [phone] Fax: [phone]  Patient notified that their request is being sent to the clinical staff for review and that they should receive a response within 2 business days.   Please advise at Mobile [phone] (mobile)

## 2023-06-05 ENCOUNTER — Other Ambulatory Visit: Payer: Self-pay | Admitting: Family Medicine

## 2023-06-05 DIAGNOSIS — R519 Headache, unspecified: Secondary | ICD-10-CM

## 2023-06-06 ENCOUNTER — Telehealth: Payer: Self-pay

## 2023-06-06 ENCOUNTER — Ambulatory Visit: Payer: Medicare PPO | Admitting: Physician Assistant

## 2023-06-06 ENCOUNTER — Other Ambulatory Visit: Payer: Self-pay | Admitting: Family Medicine

## 2023-06-06 ENCOUNTER — Encounter: Payer: Self-pay | Admitting: Physician Assistant

## 2023-06-06 ENCOUNTER — Other Ambulatory Visit (INDEPENDENT_AMBULATORY_CARE_PROVIDER_SITE_OTHER): Payer: Medicare PPO

## 2023-06-06 VITALS — BP 120/82 | HR 98 | Temp 98.0°F | Resp 20 | Ht 65.0 in

## 2023-06-06 DIAGNOSIS — R300 Dysuria: Secondary | ICD-10-CM | POA: Diagnosis not present

## 2023-06-06 DIAGNOSIS — R82998 Other abnormal findings in urine: Secondary | ICD-10-CM

## 2023-06-06 DIAGNOSIS — N390 Urinary tract infection, site not specified: Secondary | ICD-10-CM

## 2023-06-06 DIAGNOSIS — B839 Helminthiasis, unspecified: Secondary | ICD-10-CM | POA: Diagnosis not present

## 2023-06-06 LAB — POC URINALSYSI DIPSTICK (AUTOMATED)
Bilirubin, UA: NEGATIVE
Blood, UA: NEGATIVE
Glucose, UA: NEGATIVE
Ketones, UA: NEGATIVE
Nitrite, UA: POSITIVE
Protein, UA: POSITIVE — AB
Spec Grav, UA: 1.01 (ref 1.010–1.025)
Urobilinogen, UA: 0.2 E.U./dL
pH, UA: 6 (ref 5.0–8.0)

## 2023-06-06 MED ORDER — CEPHALEXIN 500 MG PO CAPS: 500.0000 mg | ORAL_CAPSULE | Freq: Two times a day (BID) | ORAL | 0 refills | Status: DC

## 2023-06-06 NOTE — Progress Notes (Signed)
Established patient visit   Patient: [name]   DOB: September 30, 1946   77 y.o. Female  MRN: [id number] Visit Date: 06/06/2023  Today's healthcare provider: Alfredia Ferguson, PA-C   Cc. Worm in stool  Subjective    HPI  Pt reports on Saturday x 3 days ago, experiencing intense abdominal pain, and then having a BM where she passed several 'worms'. Brings one in with her today, it is a yellow, thick appearing mucoid object. Pt states it appears like a grub. Reports there were several.Denies current abdominal pain, reports pain resolved after that BM. Reports normal BM since.   Pt reports eating fish Friday/Sat, but cooked, not raw.    Medications: Outpatient Medications Prior to Visit  Medication Sig   amphetamine-dextroamphetamine (ADDERALL XR) 20 MG 24 hr capsule 1 po bid   clonazePAM (KLONOPIN) 0.5 MG tablet Take 1 tablet (0.5 mg total) by mouth 3 (three) times daily as needed.   fluticasone (FLONASE) 50 MCG/ACT nasal spray Place 2 sprays into both nostrils daily.   loperamide (IMODIUM) 2 MG capsule Take 1 capsule (2 mg total) by mouth as needed for diarrhea or loose stools.   losartan (COZAAR) 50 MG tablet Take 1 tablet (50 mg total) by mouth daily.   ondansetron (ZOFRAN-ODT) 8 MG disintegrating tablet Take 1 tablet (8 mg total) by mouth every 8 (eight) hours as needed for nausea or vomiting.   oxyCODONE (ROXICODONE) 15 MG immediate release tablet Take 1 tablet (15 mg total) by mouth 4 (four) times daily. (Patient taking differently: Take 15 mg by mouth 5 (five) times daily.)   promethazine (PHENERGAN) 25 MG suppository Place 1 suppository (25 mg total) rectally every 6 (six) hours as needed for nausea or vomiting.   promethazine-dextromethorphan (PROMETHAZINE-DM) 6.25-15 MG/5ML syrup Take 5 mLs by mouth 4 (four) times daily as needed.   SUMAtriptan (IMITREX) 50 MG tablet TAKE 1 TABLET BY MOUTH AT ONSET OF HEADACHE; MAY REPEAT 1 TABLET IN 2 HOURS IF NEEDED. (MAX 2 TABLETS IN 24  HOURS)   venlafaxine XR (EFFEXOR-XR) 75 MG 24 hr capsule Take 2 capsules (150 mg total) by mouth daily.   [DISCONTINUED] pantoprazole (PROTONIX) 20 MG tablet Take 1 tablet (20 mg total) by mouth daily.   No facility-administered medications prior to visit.      Objective    BP 120/82 (BP Location: Left Arm, Patient Position: Sitting, Cuff Size: Normal)   Pulse 98   Temp 98 F (36.7 C) (Oral)   Resp 20   Ht 5\' 5"  (1.651 m)   SpO2 95%   BMI 23.00 kg/m    Physical Exam Vitals reviewed.  Constitutional:      Appearance: She is not ill-appearing.  HENT:     Head: Normocephalic.  Eyes:     Conjunctiva/sclera: Conjunctivae normal.  Cardiovascular:     Rate and Rhythm: Normal rate.  Pulmonary:     Effort: Pulmonary effort is normal. No respiratory distress.  Abdominal:     Palpations: Abdomen is soft.     Tenderness: There is no abdominal tenderness. There is no guarding or rebound.  Neurological:     General: No focal deficit present.     Mental Status: She is alert and oriented to person, place, and time.  Psychiatric:        Mood and Affect: Mood normal.        Behavior: Behavior normal.      Assessment & Plan     1. Worms  in stool I do not know what the object she passed is.  Will order stool culture/parasite to further eval.  Advised pt if she continues to have abdominal pain and mucoid diarrhea to contact office, would order RUQ [place] r/o gallbladder etiology.  - Ova and parasite examination; Future - Stool Culture; Future  Return if symptoms worsen or fail to improve.      I, Alfredia Ferguson, PA-C have reviewed all documentation for this visit. The documentation on  06/06/23   for the exam, diagnosis, procedures, and orders are all accurate and complete.    Alfredia Ferguson, PA-C  [facility] at [facility] [phone] (phone) [phone] (fax)  [facility]

## 2023-06-06 NOTE — Progress Notes (Signed)
Urine sample drop off

## 2023-06-06 NOTE — Telephone Encounter (Signed)
Appt today w/ Lillia Abed.

## 2023-06-06 NOTE — Telephone Encounter (Signed)
Initial Comment Caller states she woke up around 3am this morning with a stomach ache similar to gas pains and had an unusual bowel movement. When she looked into the toilet it looked like worms similar to grub worms. She thinks it may be parasites. She has it in water in a medicine bottle. Translation No Nurse Assessment Nurse: Violeta Gelinas, RN, Regulatory affairs officer (Eastern Time): 06/04/2023 2:16:15 PM Confirm and document reason for call. If symptomatic, describe symptoms. ---Pt reports that she has been having stomach ache/ discomfort and had unusual BM with worms in it. Pt has it in a bottle in some water. onset of symptoms today/overnight. Does the patient have any new or worsening symptoms? ---Yes Will a triage be completed? ---Yes Related visit to physician within the last 2 weeks? ---No Does the PT have any chronic conditions? (i.e. diabetes, asthma, this includes High risk factors for pregnancy, etc.) ---Yes List chronic conditions. ---Pt reports only having 1 kidney-removed 2019 Is this a behavioral health or substance abuse call? ---No Guidelines Guideline Title Affirmed Question Affirmed Notes Nurse Date/Time (Eastern Time) Worms - Other Than Pinworms Passed a "worm" by rectum [place], RN, Hospital doctor 06/04/2023 2:20:37 PM Disp. Time Lamount Cohen Time) Disposition Final User 06/04/2023 1:54:43 PM Send To RN Personal Cathey Endow NOTE: All timestamps contained within this report are represented as [place] Standard Time. CONFIDENTIALTY NOTICE: This fax transmission is intended only for the addressee. It contains information that is legally privileged, confidential or otherwise protected from use or disclosure. If you are not the intended recipient, you are strictly prohibited from reviewing, disclosing, copying using or disseminating any of this information or taking any action in reliance on or regarding this information. If you have received this fax in error, please notify  us immediately by telephone so that we can arrange for its return to [place]. Phone: [phone], Toll-Free: [phone], Fax: [phone] Page: 2 of 2 Call Id: [id number] Disp. Time Lamount Cohen Time) Disposition Final User 06/04/2023 2:30:22 PM See PCP within 24 Hours Yes Violeta Gelinas, RN, Hospital doctor Final Disposition 06/04/2023 2:30:22 PM See PCP within 24 Hours Yes Violeta Gelinas, RN, Control and instrumentation engineer Understands Yes PreDisposition Did not know what to do Care Advice Given Per Guideline SEE PCP WITHIN 24 HOURS: * IF OFFICE WILL BE CLOSED: You need to be seen within the next 24 hours. A clinic or an urgent care center is often a good source of care if your doctor's office is closed or you can't get an appointment. PRESERVE WORM: * Place the suspected 'worm' in a bottle of water with a pinch of salt. * Bring it in so it can be examined or tested. CALL BACK IF: * You become worse CARE ADVICE per Worms - Other Than Pinworms (Adult) guideline. Comments User: Benjamin Stain Date/Time Lamount Cohen Time): 06/04/2023 2:14:58 PM Pt is having Abd pain. Referrals GO TO FACILITY UNDECIDED

## 2023-06-14 ENCOUNTER — Encounter: Payer: Self-pay | Admitting: Internal Medicine

## 2023-06-16 DIAGNOSIS — M810 Age-related osteoporosis without current pathological fracture: Secondary | ICD-10-CM | POA: Diagnosis not present

## 2023-06-16 DIAGNOSIS — G894 Chronic pain syndrome: Secondary | ICD-10-CM | POA: Diagnosis not present

## 2023-06-16 DIAGNOSIS — M4120 Other idiopathic scoliosis, site unspecified: Secondary | ICD-10-CM | POA: Diagnosis not present

## 2023-06-17 ENCOUNTER — Encounter: Payer: Self-pay | Admitting: Family Medicine

## 2023-07-04 ENCOUNTER — Other Ambulatory Visit: Payer: Self-pay | Admitting: Family Medicine

## 2023-07-04 DIAGNOSIS — F988 Other specified behavioral and emotional disorders with onset usually occurring in childhood and adolescence: Secondary | ICD-10-CM

## 2023-07-04 MED ORDER — AMPHETAMINE-DEXTROAMPHET ER 20 MG PO CP24
ORAL_CAPSULE | ORAL | 0 refills | Status: DC
Start: 2023-07-04 — End: 2023-08-18

## 2023-07-04 NOTE — Telephone Encounter (Signed)
Prescription Request  07/04/2023  Is this a "Controlled Substance" medicine? Yes  LOV: 05/20/2023  What is the name of the medication or equipment? mphetamine-dextroamphetamine (ADDERALL XR) 20 MG 24 hr capsule  Have you contacted your pharmacy to request a refill? Yes   Which pharmacy would you like this sent to?  [facility] [id number] - [place], [place] - [address] [place]  [address] Phone: [phone] Fax: [phone]      Patient notified that their request is being sent to the clinical staff for review and that they should receive a response within 2 business days.   Please advise at [facility] [phone]

## 2023-07-04 NOTE — Telephone Encounter (Signed)
Requesting: Adderall XR Contract: 11/12/21 UDS: 11/12/2021 Last OV: 06/06/2023 Next OV: 11/21/23 Last Refill: 05/23/2023, #60--0 RF Database:   Please advise

## 2023-07-04 NOTE — Addendum Note (Signed)
Addended by: Roxanne Gates on: 07/04/2023 01:50 PM   Modules accepted: Orders

## 2023-07-08 ENCOUNTER — Ambulatory Visit: Payer: Medicare PPO | Admitting: *Deleted

## 2023-07-08 VITALS — Ht 65.0 in

## 2023-07-08 DIAGNOSIS — Z1211 Encounter for screening for malignant neoplasm of colon: Secondary | ICD-10-CM

## 2023-07-08 MED ORDER — PEG 3350-KCL-NA BICARB-NACL 420 G PO SOLR
4000.0000 mL | Freq: Once | ORAL | 0 refills | Status: AC
Start: 2023-07-08 — End: 2023-07-08

## 2023-07-08 NOTE — Progress Notes (Signed)
Virtual PV over telephone.  Instructions forwarded through MyChart. Miralax nightly the week before the colonoscopy bc of irregular constipation.  No egg or soy allergy known to patient  No issues known to pt with past sedation with any surgeries or procedures Patient denies ever being told they had issues or difficulty with intubation  No FH of Malignant Hyperthermia Pt is not on diet pills Pt is not on  home 02  Pt is not on blood thinners  Pt denies issues with constipation  No A fib or A flutter Have any cardiac testing pending--NO Pt instructed to use [email or web address] or GoodRx for a price reduction on prep

## 2023-07-12 ENCOUNTER — Encounter: Payer: Self-pay | Admitting: Internal Medicine

## 2023-07-14 DIAGNOSIS — G894 Chronic pain syndrome: Secondary | ICD-10-CM | POA: Diagnosis not present

## 2023-07-14 DIAGNOSIS — M810 Age-related osteoporosis without current pathological fracture: Secondary | ICD-10-CM | POA: Diagnosis not present

## 2023-07-14 DIAGNOSIS — M4120 Other idiopathic scoliosis, site unspecified: Secondary | ICD-10-CM | POA: Diagnosis not present

## 2023-07-22 ENCOUNTER — Encounter: Payer: Medicare PPO | Admitting: Internal Medicine

## 2023-07-23 ENCOUNTER — Other Ambulatory Visit: Payer: Self-pay | Admitting: Internal Medicine

## 2023-07-23 ENCOUNTER — Other Ambulatory Visit: Payer: Self-pay | Admitting: Family Medicine

## 2023-07-23 DIAGNOSIS — Z1211 Encounter for screening for malignant neoplasm of colon: Secondary | ICD-10-CM

## 2023-07-23 DIAGNOSIS — F419 Anxiety disorder, unspecified: Secondary | ICD-10-CM

## 2023-07-25 NOTE — Telephone Encounter (Signed)
Requesting: Klonopin Contract: 11/12/21 UDS: 11/12/21 Last OV: 06/06/23 Next OV: 11/21/23 Last Refill: 05/16/2023, #90--1 RF Database:   Please advise

## 2023-08-18 ENCOUNTER — Other Ambulatory Visit: Payer: Self-pay | Admitting: Family Medicine

## 2023-08-18 DIAGNOSIS — F988 Other specified behavioral and emotional disorders with onset usually occurring in childhood and adolescence: Secondary | ICD-10-CM

## 2023-08-18 DIAGNOSIS — G894 Chronic pain syndrome: Secondary | ICD-10-CM | POA: Diagnosis not present

## 2023-08-18 DIAGNOSIS — M810 Age-related osteoporosis without current pathological fracture: Secondary | ICD-10-CM | POA: Diagnosis not present

## 2023-08-18 DIAGNOSIS — R519 Headache, unspecified: Secondary | ICD-10-CM

## 2023-08-18 MED ORDER — AMPHETAMINE-DEXTROAMPHET ER 20 MG PO CP24
ORAL_CAPSULE | ORAL | Status: DC
Start: 2023-08-18 — End: 2023-08-26

## 2023-08-18 MED ORDER — SUMATRIPTAN SUCCINATE 50 MG PO TABS
ORAL_TABLET | ORAL | 1 refills | Status: DC
Start: 2023-08-18 — End: 2023-12-26

## 2023-08-26 ENCOUNTER — Other Ambulatory Visit: Payer: Self-pay | Admitting: Family Medicine

## 2023-08-26 DIAGNOSIS — F988 Other specified behavioral and emotional disorders with onset usually occurring in childhood and adolescence: Secondary | ICD-10-CM

## 2023-08-26 MED ORDER — AMPHETAMINE-DEXTROAMPHET ER 20 MG PO CP24
ORAL_CAPSULE | ORAL | 0 refills | Status: DC
Start: 2023-08-26 — End: 2023-10-31

## 2023-08-26 NOTE — Telephone Encounter (Signed)
Prescription Request  08/26/2023  Is this a "Controlled Substance" medicine? Yes  LOV: 05/20/2023  What is the name of the medication or equipment?   amphetamine-dextroamphetamine (ADDERALL XR) 20 MG 24 hr capsule   Have you contacted your pharmacy to request a refill? Yes   Which pharmacy would you like this sent to?  [facility] [id number] - [place], [place] - [address] [place] [address] Phone: [phone] Fax: [phone]    Patient notified that their request is being sent to the clinical staff for review and that they should receive a response within 2 business days.   Please advise at Mobile [phone] (mobile)

## 2023-08-26 NOTE — Addendum Note (Signed)
Addended by: Roxanne Gates on: 08/26/2023 01:51 PM   Modules accepted: Orders

## 2023-08-26 NOTE — Telephone Encounter (Signed)
Med needs to be resent. Rx was set to No Print

## 2023-09-02 ENCOUNTER — Other Ambulatory Visit: Payer: Self-pay | Admitting: Family Medicine

## 2023-09-02 DIAGNOSIS — F419 Anxiety disorder, unspecified: Secondary | ICD-10-CM

## 2023-09-02 MED ORDER — CLONAZEPAM 0.5 MG PO TABS
0.5000 mg | ORAL_TABLET | Freq: Three times a day (TID) | ORAL | 0 refills | Status: DC | PRN
Start: 2023-09-02 — End: 2023-09-30

## 2023-09-02 NOTE — Addendum Note (Signed)
Addended by: Roxanne Gates on: 09/02/2023 09:20 AM   Modules accepted: Orders

## 2023-09-02 NOTE — Telephone Encounter (Signed)
Requesting: Klonopin Contract: 11/2021 UDS: 11/2021 Last OV: 06/06/23 Next OV: 11/21/23 Last Refill: 07/25/23, #90--0 RF Database:   Please advise

## 2023-09-02 NOTE — Telephone Encounter (Signed)
Prescription Request  09/02/2023  Is this a "Controlled Substance" medicine? No  LOV: 05/20/2023  What is the name of the medication or equipment? clonazePAM (KLONOPIN) 0.5 MG tablet  Have you contacted your pharmacy to request a refill? Yes   Which pharmacy would you like this sent to?  [facility] [id number] - [place], [place] - [address] [place] [address] Phone: [phone] Fax: [phone]    Patient notified that their request is being sent to the clinical staff for review and that they should receive a response within 2 business days.   Please advise at [facility] [phone]

## 2023-09-22 ENCOUNTER — Other Ambulatory Visit: Payer: Self-pay | Admitting: Family Medicine

## 2023-09-22 DIAGNOSIS — Z79899 Other long term (current) drug therapy: Secondary | ICD-10-CM | POA: Diagnosis not present

## 2023-09-22 DIAGNOSIS — M810 Age-related osteoporosis without current pathological fracture: Secondary | ICD-10-CM | POA: Diagnosis not present

## 2023-09-22 DIAGNOSIS — M4120 Other idiopathic scoliosis, site unspecified: Secondary | ICD-10-CM | POA: Diagnosis not present

## 2023-09-22 DIAGNOSIS — G894 Chronic pain syndrome: Secondary | ICD-10-CM | POA: Diagnosis not present

## 2023-09-22 DIAGNOSIS — Z79891 Long term (current) use of opiate analgesic: Secondary | ICD-10-CM | POA: Diagnosis not present

## 2023-09-30 ENCOUNTER — Other Ambulatory Visit: Payer: Self-pay | Admitting: Family Medicine

## 2023-09-30 DIAGNOSIS — F419 Anxiety disorder, unspecified: Secondary | ICD-10-CM

## 2023-09-30 MED ORDER — CLONAZEPAM 0.5 MG PO TABS
0.5000 mg | ORAL_TABLET | Freq: Three times a day (TID) | ORAL | 0 refills | Status: DC | PRN
Start: 2023-09-30 — End: 2023-10-27

## 2023-09-30 NOTE — Telephone Encounter (Signed)
Requesting: clonazepam 0.5mg   Contract:11/12/21 UDS: 11/12/21 Last Visit: 05/20/23 Next Visit: 11/21/23 Last Refill: 09/02/23 #90 and 0RF   Please Advise

## 2023-10-14 NOTE — Progress Notes (Signed)
Pt did not answer phone for AWV.   This encounter was created in error - please disregard.

## 2023-10-25 ENCOUNTER — Other Ambulatory Visit: Payer: Self-pay | Admitting: Family Medicine

## 2023-10-25 DIAGNOSIS — F419 Anxiety disorder, unspecified: Secondary | ICD-10-CM

## 2023-10-26 NOTE — Telephone Encounter (Signed)
Requesting: Klonopin Contract: 11/12/21 UDS: 11/12/21 Last OV: 06/06/23 Next OV: 11/21/23 Last Refill: 09/30/23, #90--0 RF Database:   Please advise

## 2023-10-27 DIAGNOSIS — G894 Chronic pain syndrome: Secondary | ICD-10-CM | POA: Diagnosis not present

## 2023-10-27 DIAGNOSIS — M810 Age-related osteoporosis without current pathological fracture: Secondary | ICD-10-CM | POA: Diagnosis not present

## 2023-10-31 ENCOUNTER — Other Ambulatory Visit: Payer: Self-pay | Admitting: Family Medicine

## 2023-10-31 DIAGNOSIS — F988 Other specified behavioral and emotional disorders with onset usually occurring in childhood and adolescence: Secondary | ICD-10-CM

## 2023-10-31 MED ORDER — AMPHETAMINE-DEXTROAMPHET ER 20 MG PO CP24: ORAL_CAPSULE | ORAL | 0 refills | Status: DC

## 2023-10-31 NOTE — Telephone Encounter (Signed)
Copied from CRM [id number]. Topic: Clinical - Medication Refill >> Oct 31, 2023  9:26 AM Desma Mcgregor wrote: Most Recent Primary Care Visit:  Provider: [facility]  Department: [facility]  Visit Type: LAB  Date: 06/06/2023  Medication: ***  Has the patient contacted their pharmacy?  (Agent: If no, request that the patient contact the pharmacy for the refill. If patient does not wish to contact the pharmacy document the reason why and proceed with request.) (Agent: If yes, when and what did the pharmacy advise?)  Is this the correct pharmacy for this prescription?  If no, delete pharmacy and type the correct one.  This is the patient's preferred pharmacy:  [facility] [id number] - [place], [place] - [address] [place] [address] Phone: [phone] Fax: [phone]  [facility] - [place], [place] - [address] [place]  [address] Phone: [phone] Fax: [phone]   Has the prescription been filled recently?   Is the patient out of the medication?   Has the patient been seen for an appointment in the last year OR does the patient have an upcoming appointment?   Can we respond through MyChart?   Agent: Please be advised that Rx refills may take up to 3 business days. We ask that you follow-up with your pharmacy.

## 2023-10-31 NOTE — Telephone Encounter (Signed)
Requesting: Adderall 20 mg  Contract: 11/12/2021 UDS: 11/12/2021 Last Visit: 06/06/2023 Next Visit: 11/21/2023 Last Refill: 08/26/2023  Please Advise

## 2023-11-12 ENCOUNTER — Other Ambulatory Visit: Payer: Self-pay | Admitting: Family Medicine

## 2023-11-17 DIAGNOSIS — M5106 Intervertebral disc disorders with myelopathy, lumbar region: Secondary | ICD-10-CM | POA: Diagnosis not present

## 2023-11-17 DIAGNOSIS — M4120 Other idiopathic scoliosis, site unspecified: Secondary | ICD-10-CM | POA: Diagnosis not present

## 2023-11-17 DIAGNOSIS — G894 Chronic pain syndrome: Secondary | ICD-10-CM | POA: Diagnosis not present

## 2023-11-17 DIAGNOSIS — M40209 Unspecified kyphosis, site unspecified: Secondary | ICD-10-CM | POA: Diagnosis not present

## 2023-11-17 DIAGNOSIS — M810 Age-related osteoporosis without current pathological fracture: Secondary | ICD-10-CM | POA: Diagnosis not present

## 2023-11-21 ENCOUNTER — Ambulatory Visit: Payer: Medicare PPO | Admitting: Family Medicine

## 2023-12-19 ENCOUNTER — Other Ambulatory Visit: Payer: Self-pay | Admitting: Family Medicine

## 2023-12-19 DIAGNOSIS — F988 Other specified behavioral and emotional disorders with onset usually occurring in childhood and adolescence: Secondary | ICD-10-CM

## 2023-12-20 ENCOUNTER — Other Ambulatory Visit: Payer: Self-pay | Admitting: Family Medicine

## 2023-12-20 DIAGNOSIS — F988 Other specified behavioral and emotional disorders with onset usually occurring in childhood and adolescence: Secondary | ICD-10-CM

## 2023-12-20 MED ORDER — AMPHETAMINE-DEXTROAMPHET ER 20 MG PO CP24: ORAL_CAPSULE | ORAL | 0 refills | Status: DC

## 2023-12-20 NOTE — Telephone Encounter (Signed)
Requesting: Adderall XR 20mg   Contract: 11/12/21 UDS: 11/12/21 Last Visit: 05/20/23 Next Visit: None Last Refill: 10/31/23 #60 and 0RF   Please Advise

## 2023-12-20 NOTE — Telephone Encounter (Signed)
Duplicate request

## 2023-12-20 NOTE — Telephone Encounter (Signed)
Copied from CRM [id number]. Topic: Clinical - Medication Refill >> Dec 20, 2023 11:47 AM Corin V wrote: Most Recent Primary Care Visit:  Provider: [facility]  Department: [facility]  Visit Type: LAB  Date: 06/06/2023  Medication: amphetamine-dextroamphetamine (ADDERALL XR) 20 MG 24 hr capsule  Has the patient contacted their pharmacy? Yes (Agent: If no, request that the patient contact the pharmacy for the refill. If patient does not wish to contact the pharmacy document the reason why and proceed with request.) (Agent: If yes, when and what did the pharmacy advise?)  Is this the correct pharmacy for this prescription? Yes If no, delete pharmacy and type the correct one.  This is the patient's preferred pharmacy:  [facility] [id number] - [place], [place] - [address] [place] [address] Phone: [phone] Fax: [phone]   Has the prescription been filled recently? No  Is the patient out of the medication? No  Has the patient been seen for an appointment in the last year OR does the patient have an upcoming appointment? Yes  Can we respond through MyChart? Yes  Agent: Please be advised that Rx refills may take up to 3 business days. We ask that you follow-up with your pharmacy.

## 2023-12-22 DIAGNOSIS — M810 Age-related osteoporosis without current pathological fracture: Secondary | ICD-10-CM | POA: Diagnosis not present

## 2023-12-22 DIAGNOSIS — G894 Chronic pain syndrome: Secondary | ICD-10-CM | POA: Diagnosis not present

## 2023-12-26 ENCOUNTER — Other Ambulatory Visit: Payer: Self-pay | Admitting: Family Medicine

## 2023-12-26 DIAGNOSIS — R519 Headache, unspecified: Secondary | ICD-10-CM

## 2023-12-28 ENCOUNTER — Other Ambulatory Visit: Payer: Self-pay | Admitting: Family Medicine

## 2023-12-28 DIAGNOSIS — F419 Anxiety disorder, unspecified: Secondary | ICD-10-CM

## 2023-12-28 NOTE — Telephone Encounter (Signed)
Requesting: Klonopin Contract: 11/12/21 UDS: 11/12/21 Last OV: 06/06/23 (no showed appt in January) Next OV: none Last Refill: 10/27/23, #90--1 RF Database:     Please advise

## 2024-01-19 DIAGNOSIS — M810 Age-related osteoporosis without current pathological fracture: Secondary | ICD-10-CM | POA: Diagnosis not present

## 2024-01-19 DIAGNOSIS — M5106 Intervertebral disc disorders with myelopathy, lumbar region: Secondary | ICD-10-CM | POA: Diagnosis not present

## 2024-01-19 DIAGNOSIS — G894 Chronic pain syndrome: Secondary | ICD-10-CM | POA: Diagnosis not present

## 2024-01-27 ENCOUNTER — Other Ambulatory Visit: Payer: Self-pay | Admitting: Family Medicine

## 2024-01-27 DIAGNOSIS — F419 Anxiety disorder, unspecified: Secondary | ICD-10-CM

## 2024-01-27 NOTE — Telephone Encounter (Unsigned)
 Copied from CRM [id number]. Topic: Clinical - Medication Refill >> Jan 27, 2024  3:07 PM Isabell A wrote: Most Recent Primary Care Visit:  Provider: [facility]  Department: [facility]  Visit Type: LAB  Date: 06/06/2023  Medication: clonazePAM (KLONOPIN) 0.5 MG tablet  Has the patient contacted their pharmacy? No (Agent: If no, request that the patient contact the pharmacy for the refill. If patient does not wish to contact the pharmacy document the reason why and proceed with request.) (Agent: If yes, when and what did the pharmacy advise?)  Is this the correct pharmacy for this prescription? Yes If no, delete pharmacy and type the correct one.  This is the patient's preferred pharmacy:  [facility] [id number] - [place],  - [address] [place] [address] Phone: [phone] Fax: [phone]  Has the prescription been filled recently? Yes  Is the patient out of the medication? Yes  Has the patient been seen for an appointment in the last year OR does the patient have an upcoming appointment? Yes  Can we respond through MyChart? Yes  Agent: Please be advised that Rx refills may take up to 3 business days. We ask that you follow-up with your pharmacy.

## 2024-01-30 MED ORDER — CLONAZEPAM 0.5 MG PO TABS: 0.5000 mg | ORAL_TABLET | Freq: Three times a day (TID) | ORAL | 1 refills | Status: DC | PRN

## 2024-01-30 NOTE — Telephone Encounter (Signed)
 Requesting: Klonopin 0.5 mg  Contract: N/A UDS: N/A Last Visit: 06/06/2023 Next Visit: N/A Last Refill: 12/28/2023  Please Advise

## 2024-01-31 ENCOUNTER — Other Ambulatory Visit: Payer: Self-pay | Admitting: Family Medicine

## 2024-01-31 DIAGNOSIS — F411 Generalized anxiety disorder: Secondary | ICD-10-CM

## 2024-01-31 MED ORDER — VENLAFAXINE HCL ER 75 MG PO CP24: 150.0000 mg | ORAL_CAPSULE | Freq: Every day | ORAL | 0 refills | Status: DC

## 2024-01-31 NOTE — Telephone Encounter (Signed)
 Copied from CRM [id number]. Topic: Clinical - Medication Refill >> Jan 31, 2024 12:47 PM Pascal Lux wrote: Most Recent Primary Care Visit:  Provider: [facility]  [facility]: [facility]  Visit Type: LAB  Date: 06/06/2023  Medication: venlafaxine XR (EFFEXOR-XR) 75 MG 24 hr capsule [[id number]]  Has the patient contacted their pharmacy? No (Agent: If no, request that the patient contact the pharmacy for the refill. If patient does not wish to contact the pharmacy document the reason why and proceed with request.) (Agent: If yes, when and what did the pharmacy advise?)  Is this the correct pharmacy for this prescription? Yes If no, delete pharmacy and type the correct one.  This is the patient's preferred pharmacy:  [facility] [id number] - [place], [place] - [address] [place] [address] Phone: [phone] Fax: [phone]   Has the prescription been filled recently? No  Is the patient out of the medication? Yes, she cannot find it. It's been 3 days without.  Has the patient been seen for an appointment in the last year OR does the patient have an upcoming appointment? Yes  Can we respond through MyChart? No  Agent: Please be advised that Rx refills may take up to 3 business days. We ask that you follow-up with your pharmacy.

## 2024-02-16 DIAGNOSIS — Z79891 Long term (current) use of opiate analgesic: Secondary | ICD-10-CM | POA: Diagnosis not present

## 2024-02-16 DIAGNOSIS — G894 Chronic pain syndrome: Secondary | ICD-10-CM | POA: Diagnosis not present

## 2024-02-16 DIAGNOSIS — M5106 Intervertebral disc disorders with myelopathy, lumbar region: Secondary | ICD-10-CM | POA: Diagnosis not present

## 2024-02-16 DIAGNOSIS — M40209 Unspecified kyphosis, site unspecified: Secondary | ICD-10-CM | POA: Diagnosis not present

## 2024-02-16 DIAGNOSIS — M810 Age-related osteoporosis without current pathological fracture: Secondary | ICD-10-CM | POA: Diagnosis not present

## 2024-02-25 ENCOUNTER — Other Ambulatory Visit: Payer: Self-pay | Admitting: Family Medicine

## 2024-02-25 DIAGNOSIS — F411 Generalized anxiety disorder: Secondary | ICD-10-CM

## 2024-03-20 ENCOUNTER — Other Ambulatory Visit: Payer: Self-pay | Admitting: Family Medicine

## 2024-03-20 DIAGNOSIS — F411 Generalized anxiety disorder: Secondary | ICD-10-CM

## 2024-03-20 DIAGNOSIS — R519 Headache, unspecified: Secondary | ICD-10-CM

## 2024-03-22 ENCOUNTER — Other Ambulatory Visit: Payer: Self-pay | Admitting: Family Medicine

## 2024-03-22 DIAGNOSIS — M40203 Unspecified kyphosis, cervicothoracic region: Secondary | ICD-10-CM | POA: Diagnosis not present

## 2024-03-22 DIAGNOSIS — R519 Headache, unspecified: Secondary | ICD-10-CM

## 2024-03-22 DIAGNOSIS — M170 Bilateral primary osteoarthritis of knee: Secondary | ICD-10-CM | POA: Diagnosis not present

## 2024-03-22 DIAGNOSIS — M5116 Intervertebral disc disorders with radiculopathy, lumbar region: Secondary | ICD-10-CM | POA: Diagnosis not present

## 2024-03-22 DIAGNOSIS — M810 Age-related osteoporosis without current pathological fracture: Secondary | ICD-10-CM | POA: Diagnosis not present

## 2024-03-22 DIAGNOSIS — G894 Chronic pain syndrome: Secondary | ICD-10-CM | POA: Diagnosis not present

## 2024-03-22 DIAGNOSIS — M25551 Pain in right hip: Secondary | ICD-10-CM | POA: Diagnosis not present

## 2024-03-27 DIAGNOSIS — G894 Chronic pain syndrome: Secondary | ICD-10-CM | POA: Diagnosis not present

## 2024-04-24 DIAGNOSIS — M5116 Intervertebral disc disorders with radiculopathy, lumbar region: Secondary | ICD-10-CM | POA: Diagnosis not present

## 2024-04-24 DIAGNOSIS — G894 Chronic pain syndrome: Secondary | ICD-10-CM | POA: Diagnosis not present

## 2024-04-24 DIAGNOSIS — M810 Age-related osteoporosis without current pathological fracture: Secondary | ICD-10-CM | POA: Diagnosis not present

## 2024-04-24 DIAGNOSIS — M40203 Unspecified kyphosis, cervicothoracic region: Secondary | ICD-10-CM | POA: Diagnosis not present

## 2024-04-27 ENCOUNTER — Other Ambulatory Visit: Payer: Self-pay | Admitting: Family Medicine

## 2024-04-27 ENCOUNTER — Other Ambulatory Visit: Payer: Self-pay | Admitting: Family

## 2024-04-27 DIAGNOSIS — F419 Anxiety disorder, unspecified: Secondary | ICD-10-CM

## 2024-05-07 DIAGNOSIS — M546 Pain in thoracic spine: Secondary | ICD-10-CM | POA: Diagnosis not present

## 2024-05-07 DIAGNOSIS — R2681 Unsteadiness on feet: Secondary | ICD-10-CM | POA: Diagnosis not present

## 2024-05-07 DIAGNOSIS — M545 Low back pain, unspecified: Secondary | ICD-10-CM | POA: Diagnosis not present

## 2024-05-10 ENCOUNTER — Ambulatory Visit: Admitting: Family Medicine

## 2024-05-10 ENCOUNTER — Encounter: Payer: Self-pay | Admitting: Family Medicine

## 2024-05-10 VITALS — BP 132/90 | HR 93 | Temp 98.1°F | Resp 18 | Ht 65.0 in

## 2024-05-10 DIAGNOSIS — R197 Diarrhea, unspecified: Secondary | ICD-10-CM | POA: Diagnosis not present

## 2024-05-10 DIAGNOSIS — F419 Anxiety disorder, unspecified: Secondary | ICD-10-CM

## 2024-05-10 DIAGNOSIS — R251 Tremor, unspecified: Secondary | ICD-10-CM

## 2024-05-10 DIAGNOSIS — R110 Nausea: Secondary | ICD-10-CM

## 2024-05-10 DIAGNOSIS — M170 Bilateral primary osteoarthritis of knee: Secondary | ICD-10-CM | POA: Diagnosis not present

## 2024-05-10 DIAGNOSIS — I10 Essential (primary) hypertension: Secondary | ICD-10-CM

## 2024-05-10 DIAGNOSIS — F411 Generalized anxiety disorder: Secondary | ICD-10-CM

## 2024-05-10 MED ORDER — ONDANSETRON 8 MG PO TBDP: 8.0000 mg | ORAL_TABLET | Freq: Three times a day (TID) | ORAL | 2 refills | Status: AC | PRN

## 2024-05-10 MED ORDER — VENLAFAXINE HCL ER 75 MG PO CP24: 150.0000 mg | ORAL_CAPSULE | Freq: Every day | ORAL | 0 refills | Status: DC

## 2024-05-10 MED ORDER — LOPERAMIDE HCL 2 MG PO CAPS
2.0000 mg | ORAL_CAPSULE | ORAL | 2 refills | Status: DC | PRN
Start: 2024-05-10 — End: 2024-07-27

## 2024-05-10 MED ORDER — LOSARTAN POTASSIUM 50 MG PO TABS
50.0000 mg | ORAL_TABLET | Freq: Every day | ORAL | 1 refills | Status: DC
Start: 2024-05-10 — End: 2024-09-03

## 2024-05-10 MED ORDER — CLONAZEPAM 0.5 MG PO TABS
0.5000 mg | ORAL_TABLET | Freq: Three times a day (TID) | ORAL | 1 refills | Status: DC | PRN
Start: 2024-05-10 — End: 2024-07-06

## 2024-05-10 NOTE — Progress Notes (Signed)
 Established Patient Office Visit  Subjective   Patient ID: [name], female    DOB: 02-18-1946  Age: 78 y.o. MRN: [id number]  Chief Complaint  Patient presents with   Medication Management    HPI Discussed the use of AI scribe software for clinical note transcription with the patient, who gave verbal consent to proceed.  History of Present Illness [name] is a 78 year old female who presents with daily flu-like symptoms and uncontrollable shaking.  For the past five weeks, she has experienced daily flu-like symptoms around lunchtime, including pressure on the left side of her head and chest, extending down her arm, and significant fatigue. These symptoms persist until dinner time, after which she takes a muscle relaxer and goes to bed. The symptoms resolve overnight, and she feels fine in the mornings.  She describes uncontrollable shaking occurring for the past couple of months, characterized as a shiver-like sensation not related to cold. This shaking affects her ability to perform tasks such as writing or holding a cup and is more frequent during activities like getting into a car or wheelchair.  She experiences shortness of breath, especially when bending over, and describes a sensation of her diaphragm, lungs, and chest 'running together'.  Her current medications include loperamide  and ondansetron  as needed for diarrhea and nausea, respectively, and she takes Effexor  and clonazepam  daily. She anticipates needing a refill for her blood pressure medication next month.  Socially, she has a granddaughter who recently became a teenager and tends to isolate herself in her room.   Patient Active Problem List   Diagnosis Date Noted   Opioid dependence with withdrawal (HCC) 05/20/2023   Deep vein thrombosis (DVT) of proximal vein of both lower extremities, unspecified chronicity (HCC) 05/20/2023   Elevated BP without diagnosis of hypertension 11/12/2021    Dysuria 10/16/2019   Suspected COVID-19 virus infection 10/16/2019   Closed nondisplaced intertrochanteric fracture of right femur (HCC) 05/31/2019   Closed displaced comminuted supracondylar fracture of left humerus without intercondylar fracture 04/17/2019   Nonfunctioning kidney 12/25/2018   Hydronephrosis with ureteral stricture, not elsewhere classified 12/21/2018   Malnutrition of moderate degree 12/08/2018   Delirium 12/07/2018   Hydronephrosis of left kidney 12/07/2018   Pyelonephritis 12/07/2018   Protein-calorie malnutrition, severe 08/24/2018   Pressure injury of skin 08/22/2018   Obstruction of left ureteropelvic junction (UPJ) due to stone 08/21/2018   Hypoalbuminemia 08/21/2018   Chronic pain disorder 08/21/2018   Thrombocytopenia (HCC) 08/21/2018   Acute renal failure (ARF) (HCC) 08/21/2018   Compression fracture of spine (HCC) 08/21/2018   Acute respiratory failure with hypoxemia (HCC)    Hematoma 06/03/2018   Dysphagia 02/07/2018   Generalized anxiety disorder 07/04/2017   Nonintractable headache 07/04/2017   Exertional dyspnea 08/05/2016   Sepsis (HCC) 03/07/2015   Hypokalemia 03/07/2015   Acute encephalopathy 03/06/2015   Fever 03/06/2015   Chronic back pain 01/15/2015   Obesity (BMI 30-39.9) 10/15/2013   Pulmonary nodules 10/15/2013   CAP (community acquired pneumonia) 10/02/2013   Essential hypertension 08/24/2013   Idiopathic scoliosis 04/19/2013   Back pain 04/19/2013   Osteoporosis 04/19/2013   Edema 04/19/2013   Mild diastolic dysfunction 01/01/2013   Leg pain, bilateral 12/16/2012   Attention deficit disorder (ADD) in adult 05/24/2012   Involuntary movements 05/24/2012   DIZZINESS 12/31/2009   TREMOR, ESSENTIAL 12/12/2009   PALPITATIONS, RECURRENT 12/12/2009   OTHER VITAMIN B12 DEFICIENCY ANEMIA 10/30/2009   FEMALE STRESS INCONTINENCE 10/28/2009  MEMORY LOSS 10/28/2009   GERD 12/03/2008   ANEMIA, MILD 06/26/2008   DEPRESSION/ANXIETY  12/28/2007   PICA 12/28/2007   ACUTE BRONCHITIS 12/28/2007   FATIGUE 12/28/2007   Hyperlipidemia 05/15/2007   ARTIFICIAL MENOPAUSE 05/15/2007   Myalgia and myositis, unspecified 05/15/2007   Headache 05/15/2007   CHEST PAIN, ATYPICAL 05/15/2007   SYMPTOM, PAIN, ABDOMINAL, EPIGASTRIC 05/15/2007   Personal history presenting hazards to health 05/15/2007   PSTPRC STATUS, BARIATRIC SURGERY 05/15/2007   Past Medical History:  Diagnosis Date   Abrasion of right heel    during admission 10/ 2019 right heel open skin due to movement on sheet   ADD (attention deficit disorder)    Anemia, mild    Anxiety    Arthritis    joints, back   At high risk for falls    10-19-2018 pt has fell twice in past week, tripped   Chronic back pain    all over my back (10/02/2013)   Crushing injury of arm, right 02/06/1989's   it was crushed; wore cast from fingers to top of my shoulder (11/25/2   Crushing injury of left wrist 05/11/1989's   DOE (dyspnea on exertion)    Essential tremor    Fibromyalgia    History of palpitations 2002   recurrent   History of panic attacks    History of pneumonia 08/21/2018   w/ acute respiratory failure/ hypoxia   History of sepsis    admission 08-21-2018  secondary to uropathy obstructive from ureteral stone    Hyperlipidemia    Hypertension    10-19-2018 PER PT AMLODIPINE  ON HOLD SINCE 10/ 2019 DUE TO KIDNEY ISSUES PER DOCTOR   Idiopathic scoliosis 04/19/2013   Left ureteral stone    Migraines    once/month (10/02/2013)   Osteoporosis    Other vitamin B12 deficiency anemia    PONV (postoperative nausea and vomiting)    As a child   Pulmonary nodules/lesions, multiple    Renal insufficiency    S/P gastric bypass 09/16/2003   S/P insertion of spinal cord stimulator    per pt remote is missing   Wears dentures    upper   Wears glasses    Wears glasses    Past Surgical History:  Procedure Laterality Date   ABDOMINAL HYSTERECTOMY  1980's   W/  BSO   AND APPENDECTOMY   CARDIAC CATHETERIZATION  12-13-2002    dr delford   normal coronaries   CYSTOSCOPY WITH URETEROSCOPY AND STENT PLACEMENT Left 09/27/2018   Procedure: LEFT  URETEROSCOPY, HOLMIUM LASER LITHOTRIPSY;  Surgeon: Cam Morene ORN, MD;  Location: [facility];  Service: Urology;  Laterality: Left;   D & C HYSTERSCOPY RESECTION MYOMECTOMY  1980s   HUMERUS FRACTURE SURGERY Left 04/17/2019    Comminuted complex left intra-articular distal humerus fracture/supracondylar humerus fracture displaced   INTRAMEDULLARY (IM) NAIL INTERTROCHANTERIC Right 06/01/2019   Procedure: INTRAMEDULLARY (IM) NAIL INTERTROCHANTRIC;  Surgeon: Melodi Lerner, MD;  Location: [facility];  Service: Orthopedics;  Laterality: Right;   IR NEPHROSTOMY PLACEMENT LEFT  08/22/2018   IR NEPHROSTOMY PLACEMENT LEFT  12/08/2018   KNEE ARTHROSCOPY Right 03-31-2001    dr amy @[facility]    NEPHROLITHOTOMY Left 10/20/2018   Procedure: LEFT NEPHROLITHOTOMY PERCUTANEOUS;  Surgeon: Cam Morene ORN, MD;  Location: [facility];  Service: Urology;  Laterality: Left;   ORIF HUMERUS FRACTURE Left 04/17/2019   Procedure: Open reduction internal fixation left supracondylar humerus fracture with olecranon osteotomy and ulnar nerve release and anterior transposition and repair of  structures as necessary;  Surgeon: Camella Fallow, MD;  Location: [facility] OR;  Service: Orthopedics;  Laterality: Left;  3 hrs   REDUCTION MAMMAPLASTY Bilateral 2001   ROBOT ASSISTED LAPAROSCOPIC NEPHRECTOMY Left 12/25/2018   Procedure: XI ROBOTIC ASSISTED LAPAROSCOPIC NEPHRECTOMY, LEFT FLANK EXPLORATION WITH REMOVAL OF BATTERY PACK;  Surgeon: Cam Morene ORN, MD;  Location: [facility];  Service: Urology;  Laterality: Left;   ROUX-EN-Y GASTRIC BYPASS  09-16-2003   dr christella. gladis  @[facility]    via Laparoscopy w/ gastrojejunostomy   SPINAL CORD STIMULATOR IMPLANT  2016   10-19-2018  PER PT HAS REMOTE BUT HAS NOT USED IT SINCE DEC 2018   TONSILLECTOMY AND ADENOIDECTOMY  1955   TUBAL  LIGATION  1980's   Social History   Tobacco Use   Smoking status: Never   Smokeless tobacco: Never  Vaping Use   Vaping status: Never Used  Substance Use Topics   Alcohol use: No   Drug use: No   Social History   Socioeconomic History   Marital status: Married    Spouse name: Not on file   Number of children: 2   Years of education: Not on file   Highest education level: Not on file  Occupational History   Occupation: Retired  Tobacco Use   Smoking status: Never   Smokeless tobacco: Never  Vaping Use   Vaping status: Never Used  Substance and Sexual Activity   Alcohol use: No   Drug use: No   Sexual activity: Never    Partners: Male  Other Topics Concern   Not on file  Social History Narrative   Not on file   Social Drivers of Health   Financial Resource Strain: Low Risk  (03/22/2024)   Received from [facility]   Overall Financial Resource Strain (CARDIA)    Difficulty of Paying Living Expenses: Not hard at all  Food Insecurity: No Food Insecurity (03/22/2024)   Received from [facility]   Hunger Vital Sign    Within the past 12 months, you worried that your food would run out before you got the money to buy more.: Never true    Within the past 12 months, the food you bought just didn't last and you didn't have money to get more.: Never true  Transportation Needs: No Transportation Needs (03/22/2024)   Received from [facility] - Transportation    Lack of Transportation (Medical): No    Lack of Transportation (Non-Medical): No  Physical Activity: Inactive (09/07/2021)   Exercise Vital Sign    Days of Exercise per Week: 0 days    Minutes of Exercise per Session: 0 min  Stress: No Stress Concern Present (09/07/2021)   [employer] of Occupational [facility] - Occupational Stress Questionnaire    Feeling of Stress : Not at all  Social Connections: Moderately Isolated (09/07/2021)   Social Connection and Isolation Panel    Frequency of  Communication with Friends and Family: More than three times a week    Frequency of Social Gatherings with Friends and Family: Once a week    Attends Religious Services: Never    Database administrator or Organizations: No    Attends Banker Meetings: Never    Marital Status: Married  Catering manager Violence: Not At Risk (09/27/2022)   Humiliation, Afraid, Rape, and Kick questionnaire    Fear of Current or Ex-Partner: No    Emotionally Abused: No    Physically Abused: No    Sexually Abused:  No   Family Status  Relation Name Status   Mother  Deceased at age 65s       Met her birth mom later in life (Cause of death - MI)  No partnership data on file   Family History  Adopted: Yes  Problem Relation Age of Onset   COPD Mother    Emphysema Mother    Heart attack Mother    Other Mother        tobacco abuse   Allergies  Allergen Reactions   Zanaflex  [Tizanidine ]     hallucinations   Adhesive [Tape] Itching    Review of Systems  Constitutional:  Negative for fever and malaise/fatigue.  HENT:  Negative for congestion.   Eyes:  Negative for blurred vision.  Respiratory:  Negative for shortness of breath.   Cardiovascular:  Negative for chest pain, palpitations and leg swelling.  Gastrointestinal:  Negative for abdominal pain, blood in stool and nausea.  Genitourinary:  Negative for dysuria and frequency.  Musculoskeletal:  Negative for falls.  Skin:  Negative for rash.  Neurological:  Positive for tremors. Negative for dizziness, loss of consciousness and headaches.  Endo/Heme/Allergies:  Negative for environmental allergies.  Psychiatric/Behavioral:  Negative for depression. The patient is not nervous/anxious.       Objective:     BP (!) 132/90 (BP Location: Right Arm, Patient Position: Sitting, Cuff Size: Large)   Pulse 93   Temp 98.1 F (36.7 C) (Oral)   Resp 18   Ht 5' 5 (1.651 m)   SpO2 94%   BMI 23.00 kg/m  BP Readings from Last 3 Encounters:   05/10/24 (!) 132/90  06/06/23 120/82  05/20/23 98/70   Wt Readings from Last 3 Encounters:  02/02/22 138 lb 3.7 oz (62.7 kg)  11/12/21 138 lb 3.2 oz (62.7 kg)  09/07/21 135 lb (61.2 kg)   SpO2 Readings from Last 3 Encounters:  05/10/24 94%  06/06/23 95%  02/02/22 96%      Physical Exam Vitals and nursing note reviewed.  Constitutional:      General: She is not in acute distress.    Appearance: Normal appearance. She is well-developed.  HENT:     Head: Normocephalic and atraumatic.  Eyes:     General: No scleral icterus.       Right eye: No discharge.        Left eye: No discharge.  Cardiovascular:     Rate and Rhythm: Normal rate and regular rhythm.     Heart sounds: No murmur heard. Pulmonary:     Effort: Pulmonary effort is normal. No respiratory distress.     Breath sounds: Normal breath sounds.  Musculoskeletal:        General: Normal range of motion.     Cervical back: Normal range of motion and neck supple.     Right lower leg: No edema.     Left lower leg: No edema.  Skin:    General: Skin is warm and dry.  Neurological:     Mental Status: She is alert and oriented to person, place, and time.     Comments: No tremor today  Psychiatric:        Mood and Affect: Mood normal.        Behavior: Behavior normal.        Thought Content: Thought content normal.        Judgment: Judgment normal.      No results found for any visits on 05/10/24.  Last  CBC Lab Results  Component Value Date   WBC 4.2 05/20/2023   HGB 12.4 05/20/2023   HCT 38.3 05/20/2023   MCV 96.1 05/20/2023   MCH 31.9 02/01/2022   RDW 13.4 05/20/2023   PLT 147.0 (L) 05/20/2023   Last metabolic panel Lab Results  Component Value Date   GLUCOSE 95 05/20/2023   NA 140 05/20/2023   K 4.4 05/20/2023   CL 103 05/20/2023   CO2 29 05/20/2023   BUN 36 (H) 05/20/2023   CREATININE 1.57 (H) 05/20/2023   GFR 32.12 (L) 05/20/2023   CALCIUM 9.0 05/20/2023   PHOS 3.2 12/10/2018   PROT 7.0  05/20/2023   ALBUMIN 4.1 05/20/2023   BILITOT 0.5 05/20/2023   ALKPHOS 130 (H) 05/20/2023   AST 16 05/20/2023   ALT 6 05/20/2023   ANIONGAP 9 02/01/2022   Last lipids Lab Results  Component Value Date   CHOL 162 05/20/2023   HDL 66.10 05/20/2023   LDLCALC 79 05/20/2023   LDLDIRECT 129.1 07/17/2007   TRIG 86.0 05/20/2023   CHOLHDL 2 05/20/2023   Last hemoglobin A1c Lab Results  Component Value Date   HGBA1C 5.3 04/22/2014   Last thyroid functions Lab Results  Component Value Date   TSH 1.45 05/20/2023   Last vitamin D  Lab Results  Component Value Date   VD25OH 20.8 (L) 06/01/2019   Last vitamin B12 and Folate Lab Results  Component Value Date   VITAMINB12 572 06/03/2019   FOLATE 11.0 10/28/2009      The 10-year ASCVD risk score (Arnett DK, et al., 2019) is: 24.5%    Assessment & Plan:   Problem List Items Addressed This Visit       Unprioritized   Generalized anxiety disorder   Relevant Medications   venlafaxine  XR (EFFEXOR -XR) 75 MG 24 hr capsule   Other Relevant Orders   TSH   Other Visit Diagnoses       Tremor    -  Primary   Relevant Orders   Ambulatory referral to Neurology     Anxiety       Relevant Medications   clonazePAM  (KLONOPIN ) 0.5 MG tablet   venlafaxine  XR (EFFEXOR -XR) 75 MG 24 hr capsule     Diarrhea, unspecified type       Relevant Medications   loperamide  (IMODIUM ) 2 MG capsule   Other Relevant Orders   TSH     Primary hypertension       Relevant Medications   losartan  (COZAAR ) 50 MG tablet   Other Relevant Orders   Lipid panel   Comprehensive metabolic panel with GFR   CBC with Differential/Platelet   TSH     Nausea       Relevant Medications   ondansetron  (ZOFRAN -ODT) 8 MG disintegrating tablet     Assessment and Plan Assessment & Plan Intermittent left-sided pressure and fatigue   She reports daily pressure on the left side of her head, chest, and arm, accompanied by fatigue, occurring around lunchtime and  lasting until dinner for the past five weeks. Symptoms resolve overnight. The cause is unclear, requiring further evaluation. Refer to a neurologist.  Uncontrollable shaking   She experiences uncontrollable shaking, similar to shivering but not due to cold, affecting her ability to hold objects and write. This has persisted for a couple of months and may be related to the intermittent pressure and fatigue. Refer to a neurologist.  Hypertension   Her blood pressure is slightly elevated. She is on antihypertensive medication and will  need a refill next month.  Diarrhea   She experiences intermittent diarrhea, using loperamide  once or twice a week as needed. Refill loperamide  prescription.  Nausea   She experiences intermittent nausea, using ondansetron  as needed. Refill ondansetron  prescription.    Return in about 6 months (around 11/10/2024), or if symptoms worsen or fail to improve, for annual exam, fasting.    Hamid Brookens R Lowne Chase, DO

## 2024-05-10 NOTE — Patient Instructions (Signed)
 Tremor A tremor is trembling or shaking that you cannot control. Most tremors affect the hands or arms. Tremors can also affect the head, vocal cords, face, and other parts of the body. There are many types of tremors. Common types include: Essential tremor. These usually occur in people older than 40. This type of tremor may run in families and can happen in otherwise healthy people. Resting tremor. These occur when the muscles are at rest, such as when your hands are resting in your lap. People with Parkinson's disease often have resting tremors. Postural tremor. These occur when you try to hold a pose, such as keeping your hands outstretched. Kinetic tremor. These occur during purposeful movement, such as trying to touch a finger to your nose. Task-specific tremor. These may occur when you do certain tasks such as writing, speaking, or standing. Psychogenic tremor. These are greatly reduced or go away when you are distracted. These tremors happen due to underlying stress or psychiatric disease. They can happen in people of all ages. Some types of tremors have no known cause. Tremors can also be a symptom of nervous system problems (neurological disorders) that may occur with aging. Some tremors go away with treatment, while others do not. Follow these instructions at home: Lifestyle     If you drink alcohol: Limit how much you have to: 0-1 drink a day for women who are not pregnant. 0-2 drinks a day for men. Know how much alcohol is in a drink. In the [place] one drink equals one 12 oz bottle of beer (355 mL), one 5 oz glass of wine (148 mL), or one 1 oz glass of hard liquor (44 mL). Do not use any products that contain nicotine or tobacco. These products include cigarettes, chewing tobacco, and vaping devices, such as e-cigarettes. If you need help quitting, ask your health care provider. Avoid extreme heat and extreme cold. Limit your caffeine intake, as told by your health care  provider. Try to get 8 hours of sleep each night. Find ways to manage your stress, such as meditation or yoga. General instructions Take over-the-counter and prescription medicines only as told by your health care provider. Keep all follow-up visits. This is important. Contact a health care provider if: You develop a tremor after starting a new medicine. You have a tremor along with other symptoms such as: Numbness. Tingling. Pain. Weakness. Your tremor gets worse. Your tremor interferes with your day-to-day life. Summary A tremor is trembling or shaking that you cannot control. Most tremors affect the hands or arms. Some types of tremors have no known cause. Others may be a symptom of nervous system problems (neurological disorders). Make sure you discuss any tremors you have with your health care provider. This information is not intended to replace advice given to you by your health care provider. Make sure you discuss any questions you have with your health care provider. Document Revised: 08/14/2021 Document Reviewed: 08/14/2021 Elsevier Patient Education  2024 [employer].

## 2024-05-14 ENCOUNTER — Other Ambulatory Visit

## 2024-05-15 ENCOUNTER — Other Ambulatory Visit

## 2024-05-16 DIAGNOSIS — M546 Pain in thoracic spine: Secondary | ICD-10-CM | POA: Diagnosis not present

## 2024-05-16 DIAGNOSIS — R2681 Unsteadiness on feet: Secondary | ICD-10-CM | POA: Diagnosis not present

## 2024-05-16 DIAGNOSIS — M545 Low back pain, unspecified: Secondary | ICD-10-CM | POA: Diagnosis not present

## 2024-05-17 DIAGNOSIS — M170 Bilateral primary osteoarthritis of knee: Secondary | ICD-10-CM | POA: Diagnosis not present

## 2024-05-21 ENCOUNTER — Encounter: Payer: Self-pay | Admitting: Neurology

## 2024-05-22 ENCOUNTER — Other Ambulatory Visit (INDEPENDENT_AMBULATORY_CARE_PROVIDER_SITE_OTHER)

## 2024-05-22 DIAGNOSIS — F411 Generalized anxiety disorder: Secondary | ICD-10-CM

## 2024-05-22 DIAGNOSIS — I10 Essential (primary) hypertension: Secondary | ICD-10-CM

## 2024-05-22 DIAGNOSIS — R197 Diarrhea, unspecified: Secondary | ICD-10-CM | POA: Diagnosis not present

## 2024-05-22 LAB — CBC WITH DIFFERENTIAL/PLATELET
Basophils Absolute: 0 K/uL (ref 0.0–0.1)
Basophils Relative: 0.6 % (ref 0.0–3.0)
Eosinophils Absolute: 0.1 K/uL (ref 0.0–0.7)
Eosinophils Relative: 1.6 % (ref 0.0–5.0)
HCT: 41.3 % (ref 36.0–46.0)
Hemoglobin: 13.4 g/dL (ref 12.0–15.0)
Lymphocytes Relative: 44.1 % (ref 12.0–46.0)
Lymphs Abs: 1.8 K/uL (ref 0.7–4.0)
MCHC: 32.5 g/dL (ref 30.0–36.0)
MCV: 95.2 fl (ref 78.0–100.0)
Monocytes Absolute: 0.4 K/uL (ref 0.1–1.0)
Monocytes Relative: 9.6 % (ref 3.0–12.0)
Neutro Abs: 1.8 K/uL (ref 1.4–7.7)
Neutrophils Relative %: 44.1 % (ref 43.0–77.0)
Platelets: 196 K/uL (ref 150.0–400.0)
RBC: 4.33 Mil/uL (ref 3.87–5.11)
RDW: 15.7 % — ABNORMAL HIGH (ref 11.5–15.5)
WBC: 4.1 K/uL (ref 4.0–10.5)

## 2024-05-22 LAB — COMPREHENSIVE METABOLIC PANEL WITH GFR
ALT: 5 U/L (ref 0–35)
AST: 15 U/L (ref 0–37)
Albumin: 4.3 g/dL (ref 3.5–5.2)
Alkaline Phosphatase: 189 U/L — ABNORMAL HIGH (ref 39–117)
BUN: 20 mg/dL (ref 6–23)
CO2: 25 meq/L (ref 19–32)
Calcium: 9 mg/dL (ref 8.4–10.5)
Chloride: 110 meq/L (ref 96–112)
Creatinine, Ser: 1.41 mg/dL — ABNORMAL HIGH (ref 0.40–1.20)
GFR: 36.28 mL/min — ABNORMAL LOW (ref >60.00)
Glucose, Bld: 99 mg/dL (ref 70–99)
Potassium: 4.6 meq/L (ref 3.5–5.1)
Sodium: 141 meq/L (ref 135–145)
Total Bilirubin: 0.6 mg/dL (ref 0.2–1.2)
Total Protein: 7.1 g/dL (ref 6.0–8.3)

## 2024-05-22 LAB — LIPID PANEL
Cholesterol: 177 mg/dL (ref 0–200)
HDL: 89.5 mg/dL (ref >39.00)
LDL Cholesterol: 69 mg/dL (ref 0–99)
NonHDL: 87.43
Total CHOL/HDL Ratio: 2
Triglycerides: 91 mg/dL (ref 0.0–149.0)
VLDL: 18.2 mg/dL (ref 0.0–40.0)

## 2024-05-22 LAB — TSH: TSH: 0.91 u[IU]/mL (ref 0.35–5.50)

## 2024-05-23 ENCOUNTER — Other Ambulatory Visit: Payer: Self-pay | Admitting: Family Medicine

## 2024-05-23 DIAGNOSIS — F411 Generalized anxiety disorder: Secondary | ICD-10-CM

## 2024-05-24 DIAGNOSIS — M170 Bilateral primary osteoarthritis of knee: Secondary | ICD-10-CM | POA: Diagnosis not present

## 2024-05-30 DIAGNOSIS — M545 Low back pain, unspecified: Secondary | ICD-10-CM | POA: Diagnosis not present

## 2024-05-30 DIAGNOSIS — M546 Pain in thoracic spine: Secondary | ICD-10-CM | POA: Diagnosis not present

## 2024-05-30 DIAGNOSIS — R2681 Unsteadiness on feet: Secondary | ICD-10-CM | POA: Diagnosis not present

## 2024-06-03 ENCOUNTER — Ambulatory Visit: Payer: Self-pay | Admitting: Family Medicine

## 2024-06-03 DIAGNOSIS — I10 Essential (primary) hypertension: Secondary | ICD-10-CM

## 2024-06-04 NOTE — Addendum Note (Signed)
 Addended by: Genova Kiner D on: 06/04/2024 01:30 PM   Modules accepted: Orders

## 2024-06-05 DIAGNOSIS — Z905 Acquired absence of kidney: Secondary | ICD-10-CM | POA: Diagnosis not present

## 2024-06-05 DIAGNOSIS — N3000 Acute cystitis without hematuria: Secondary | ICD-10-CM | POA: Diagnosis not present

## 2024-06-05 DIAGNOSIS — N130 Hydronephrosis with ureteropelvic junction obstruction: Secondary | ICD-10-CM | POA: Diagnosis not present

## 2024-06-05 DIAGNOSIS — N201 Calculus of ureter: Secondary | ICD-10-CM | POA: Diagnosis not present

## 2024-06-05 DIAGNOSIS — N200 Calculus of kidney: Secondary | ICD-10-CM | POA: Diagnosis not present

## 2024-06-14 ENCOUNTER — Ambulatory Visit: Payer: Self-pay

## 2024-06-14 NOTE — Telephone Encounter (Signed)
 FYI Only or Action Required?: FYI only for provider.  Patient was last seen in primary care on 05/10/2024 by Antonio Meth, Jamee SAUNDERS, DO.  Called Nurse Triage reporting Labs Only.  Symptoms began N/A.  Interventions attempted: Other: Lab Results given.   Triage Disposition: Information or Advice Only Call  Patient/caregiver understands and will follow disposition?: Yes  **See note below**          Copied from CRM #[id number]. Topic: Clinical - Lab/Test Results >> Jun 14, 2024 12:24 PM Robinson H wrote: Reason for CRM: Patient calling back to obtain lab results, message in system to have E2C2 explain to patient. Reason for Disposition  Health information question, no triage required and triager able to answer question  Answer Assessment - Initial Assessment Questions 1. REASON FOR CALL: What is the main reason for your call? or How can I best help you?   Patient called back to discuss labs, information given. Patient stated she will call back to schedule the 3 month lab re draw at a later time. No further questions/concerns.  Protocols used: Information Only Call - No Triage-A-AH

## 2024-06-19 ENCOUNTER — Other Ambulatory Visit: Payer: Self-pay | Admitting: Family Medicine

## 2024-06-19 DIAGNOSIS — F988 Other specified behavioral and emotional disorders with onset usually occurring in childhood and adolescence: Secondary | ICD-10-CM

## 2024-06-19 NOTE — Telephone Encounter (Signed)
 Copied from CRM [id number]. Topic: Clinical - Medication Refill >> Jun 19, 2024 11:51 AM Burnard DEL wrote: Medication: amphetamine -dextroamphetamine  (ADDERALL  XR) 20 MG 24 hr capsule  Has the patient contacted their pharmacy? No (Agent: If no, request that the patient contact the pharmacy for the refill. If patient does not wish to contact the pharmacy document the reason why and proceed with request.) (Agent: If yes, when and what did the pharmacy advise?)  This is the patient's preferred pharmacy:  [facility] [id number] - [place], [place] - [address] [place] [address] Phone: [phone] Fax: [phone]    Is this the correct pharmacy for this prescription? Yes If no, delete pharmacy and type the correct one.   Has the prescription been filled recently? No  Is the patient out of the medication? Yes  Has the patient been seen for an appointment in the last year OR does the patient have an upcoming appointment? Yes  Can we respond through MyChart? Yes  Agent: Please be advised that Rx refills may take up to 3 business days. We ask that you follow-up with your pharmacy.

## 2024-06-20 NOTE — Telephone Encounter (Signed)
 Requesting: adderall  Contract: n/a UDS: n/a Last Visit:05/10/24 Next Visit:n/a Last Refill:12/20/23  Please Advise

## 2024-06-21 DIAGNOSIS — Z1330 Encounter for screening examination for mental health and behavioral disorders, unspecified: Secondary | ICD-10-CM | POA: Diagnosis not present

## 2024-06-21 DIAGNOSIS — G894 Chronic pain syndrome: Secondary | ICD-10-CM | POA: Diagnosis not present

## 2024-06-21 DIAGNOSIS — M5116 Intervertebral disc disorders with radiculopathy, lumbar region: Secondary | ICD-10-CM | POA: Diagnosis not present

## 2024-06-21 DIAGNOSIS — M810 Age-related osteoporosis without current pathological fracture: Secondary | ICD-10-CM | POA: Diagnosis not present

## 2024-06-21 DIAGNOSIS — M40203 Unspecified kyphosis, cervicothoracic region: Secondary | ICD-10-CM | POA: Diagnosis not present

## 2024-06-21 MED ORDER — AMPHETAMINE-DEXTROAMPHET ER 20 MG PO CP24: ORAL_CAPSULE | ORAL | 0 refills | Status: DC

## 2024-06-21 NOTE — Telephone Encounter (Signed)
 Copied from CRM #[id number]. Topic: Clinical - Medication Question >> Jun 21, 2024 12:52 PM Melissa C wrote: Reason for CRM: patient was calling regarding update for amphetamine -dextroamphetamine  (ADDERALL  XR) 20 MG 24 hr capsule. Order was placed on 8/12 and is still pending. Confirmed pharmacy.

## 2024-07-02 ENCOUNTER — Ambulatory Visit: Admitting: Family Medicine

## 2024-07-06 ENCOUNTER — Other Ambulatory Visit: Payer: Self-pay | Admitting: Family Medicine

## 2024-07-06 DIAGNOSIS — F419 Anxiety disorder, unspecified: Secondary | ICD-10-CM

## 2024-07-06 NOTE — Telephone Encounter (Signed)
 Requesting: Klonopin  Contract: 11/2021 UDS:     Last OV: 05/10/2024 Next OV: n/a Last Refill: 05/10/2024, #[id number] RF Database:   Please advise

## 2024-07-24 DIAGNOSIS — M810 Age-related osteoporosis without current pathological fracture: Secondary | ICD-10-CM | POA: Diagnosis not present

## 2024-07-24 DIAGNOSIS — M40203 Unspecified kyphosis, cervicothoracic region: Secondary | ICD-10-CM | POA: Diagnosis not present

## 2024-07-24 DIAGNOSIS — M5116 Intervertebral disc disorders with radiculopathy, lumbar region: Secondary | ICD-10-CM | POA: Diagnosis not present

## 2024-07-24 DIAGNOSIS — G894 Chronic pain syndrome: Secondary | ICD-10-CM | POA: Diagnosis not present

## 2024-07-26 ENCOUNTER — Ambulatory Visit: Admitting: Neurology

## 2024-07-26 ENCOUNTER — Encounter: Payer: Self-pay | Admitting: Neurology

## 2024-07-26 VITALS — BP 128/77 | HR 109 | Ht 65.0 in | Wt 139.0 lb

## 2024-07-26 DIAGNOSIS — R251 Tremor, unspecified: Secondary | ICD-10-CM

## 2024-07-26 NOTE — Progress Notes (Signed)
 NEUROLOGY CONSULTATION NOTE  WRIGLEY WINBORNE MRN: [id number] DOB: 1946/05/16  Referring provider: Dr. Jamee Antonio Meth Primary care provider: Dr. Jamee Antonio Meth  Reason for consult:  shaking  Dear Dr Meth:  Thank you for your kind referral of [name] for consultation of the above symptoms. Although her history is well known to you, please allow me to reiterate it for the purpose of our medical record. The patient was accompanied to the clinic by her husband who also provides collateral information. Records and images were personally reviewed where available.   HISTORY OF PRESENT ILLNESS: This is a pleasant 78 year old right-handed woman with a history of hypertension, hyperlipidemia, migraines, complicated spine history s/p SCS/IPG implantation, chronic pain with kyphosis, presenting for evaluation of shaking. Symptoms started a year ago and have gradually gotten worse. She reports the shaking comes and goes, some days it is really bad affecting both hands. It affects holding a cup, using utensils to eat. There are times she has no tremors at all. She has chronic neck and back pain. No numbness/tingling in extremities. There was a brief fine tremor seen when holding her cup, however the rest of the visit there were no tremors seen. They report it is worse at the end of the day when she is tired. She is adopted, no known family history of tremors. She reports I'm a very laidback, easygoing person, however anxiety has been really bad the past 3 months. She would be getting ready to go somewhere then have a panic attack. Her husband reports she lets things bother her a lot, making her anxious and making her shake, like a Catch-22. She has clonazepam , cutting it in 1/4 tablets but now taking it more frequently. Yesterday she took a whole tablet over the course of the day. She feels it does not help as much as it used to. She has been on Effexor  for years. She is not sure if the  clonazepam  helps with the tremor but it does not get worse and it calms her down.   She has had several falls over the past 7-8 yrs where she has hit her head. She has kyphosis with flexion almost 909 degrees parallel to the ground when walking. She has been very careful and reports the last fall was 6 months ago. She has a walker and 3-prong cane at home. She gets completely worn out walking from living room to kitchen, like worked hard labor. She has a stationary bike and feels better after using it. She has a history of migraines with headaches every 3rd week. Pain starts in the occipital region. Imitrex  helps. She reports a bony protrusion in the right parietal area. No dizziness. She takes Tizanidine  and reports hallucinations with 2 muscle relaxants. No diplopia. She has a lot of problems swallowing due to her head bent down, she has a lot of phlegm. She takes Tizanidine , clonazepam , and 4 Tylenol  at night to get 6 hours of sleep.   Lab Results  Component Value Date   TSH 0.91 05/22/2024      PAST MEDICAL HISTORY: Past Medical History:  Diagnosis Date   Abrasion of right heel    during admission 10/ 2019 right heel open skin due to movement on sheet   ADD (attention deficit disorder)    Anemia, mild    Anxiety    Arthritis    joints, back   At high risk for falls    10-19-2018 pt has fell twice  in past week, tripped   Chronic back pain    all over my back (10/02/2013)   Crushing injury of arm, right 02/06/1989's   it was crushed; wore cast from fingers to top of my shoulder (11/25/2   Crushing injury of left wrist 05/11/1989's   DOE (dyspnea on exertion)    Essential tremor    Fibromyalgia    History of palpitations 2002   recurrent   History of panic attacks    History of pneumonia 08/21/2018   w/ acute respiratory failure/ hypoxia   History of sepsis    admission 08-21-2018  secondary to uropathy obstructive from ureteral stone    Hyperlipidemia    Hypertension     10-19-2018 PER PT AMLODIPINE  ON HOLD SINCE 10/ 2019 DUE TO KIDNEY ISSUES PER DOCTOR   Idiopathic scoliosis 04/19/2013   Left ureteral stone    Migraines    once/month (10/02/2013)   Osteoporosis    Other vitamin B12 deficiency anemia    PONV (postoperative nausea and vomiting)    As a child   Pulmonary nodules/lesions, multiple    Renal insufficiency    S/P gastric bypass 09/16/2003   S/P insertion of spinal cord stimulator    per pt remote is missing   Wears dentures    upper   Wears glasses    Wears glasses     PAST SURGICAL HISTORY: Past Surgical History:  Procedure Laterality Date   ABDOMINAL HYSTERECTOMY  1980's   W/  BSO  AND APPENDECTOMY   CARDIAC CATHETERIZATION  12-13-2002    dr delford   normal coronaries   CYSTOSCOPY WITH URETEROSCOPY AND STENT PLACEMENT Left 09/27/2018   Procedure: LEFT  URETEROSCOPY, HOLMIUM LASER LITHOTRIPSY;  Surgeon: Cam Morene ORN, MD;  Location: [facility];  Service: Urology;  Laterality: Left;   D & C HYSTERSCOPY RESECTION MYOMECTOMY  1980s   HUMERUS FRACTURE SURGERY Left 04/17/2019    Comminuted complex left intra-articular distal humerus fracture/supracondylar humerus fracture displaced   INTRAMEDULLARY (IM) NAIL INTERTROCHANTERIC Right 06/01/2019   Procedure: INTRAMEDULLARY (IM) NAIL INTERTROCHANTRIC;  Surgeon: Melodi Lerner, MD;  Location: [facility];  Service: Orthopedics;  Laterality: Right;   IR NEPHROSTOMY PLACEMENT LEFT  08/22/2018   IR NEPHROSTOMY PLACEMENT LEFT  12/08/2018   KNEE ARTHROSCOPY Right 03-31-2001    dr amy @[facility]    NEPHROLITHOTOMY Left 10/20/2018   Procedure: LEFT NEPHROLITHOTOMY PERCUTANEOUS;  Surgeon: Cam Morene ORN, MD;  Location: [facility];  Service: Urology;  Laterality: Left;   ORIF HUMERUS FRACTURE Left 04/17/2019   Procedure: Open reduction internal fixation left supracondylar humerus fracture with olecranon osteotomy and ulnar nerve release and anterior transposition and repair of structures as necessary;   Surgeon: Camella Fallow, MD;  Location: [facility];  Service: Orthopedics;  Laterality: Left;  3 hrs   REDUCTION MAMMAPLASTY Bilateral 2001   ROBOT ASSISTED LAPAROSCOPIC NEPHRECTOMY Left 12/25/2018   Procedure: XI ROBOTIC ASSISTED LAPAROSCOPIC NEPHRECTOMY, LEFT FLANK EXPLORATION WITH REMOVAL OF BATTERY PACK;  Surgeon: Cam Morene ORN, MD;  Location: [facility];  Service: Urology;  Laterality: Left;   ROUX-EN-Y GASTRIC BYPASS  09-16-2003   dr emerson lunger  @[facility]    via Laparoscopy w/ gastrojejunostomy   SPINAL CORD STIMULATOR IMPLANT  2016   10-19-2018  PER PT HAS REMOTE BUT HAS NOT USED IT SINCE DEC 2018   TONSILLECTOMY AND ADENOIDECTOMY  1955   TUBAL LIGATION  1980's    MEDICATIONS: Current Outpatient Medications on File Prior to Visit  Medication Sig Dispense Refill  amphetamine -dextroamphetamine  (ADDERALL  XR) 20 MG 24 hr capsule 1 po bid 60 capsule 0   Baclofen  5 MG TABS Take 1 tablet by mouth 2 (two) times daily as needed.     clonazePAM  (KLONOPIN ) 0.5 MG tablet TAKE 1 TABLET BY MOUTH 3 TIMES A DAY AS NEEDED 90 tablet 1   loperamide  (IMODIUM ) 2 MG capsule Take 1 capsule (2 mg total) by mouth as needed for diarrhea or loose stools. 12 capsule 2   losartan  (COZAAR ) 50 MG tablet Take 1 tablet (50 mg total) by mouth daily. 90 tablet 1   ondansetron  (ZOFRAN -ODT) 8 MG disintegrating tablet Take 1 tablet (8 mg total) by mouth every 8 (eight) hours as needed for nausea or vomiting. 12 tablet 2   oxyCODONE  (ROXICODONE ) 15 MG immediate release tablet Take 1 tablet (15 mg total) by mouth 4 (four) times daily. 120 tablet 0   SUMAtriptan  (IMITREX ) 50 MG tablet TAKE 1 TABLET BY MOUTH AT ONSET OF MIGRAINE; MAY REPEAT 1 TABLET IN 2 HOURS IF NEEDED. 10 tablet 1   tiZANidine  (ZANAFLEX ) 4 MG tablet Take 4 mg by mouth.     traMADol  (ULTRAM ) 50 MG tablet Take 50 mg by mouth 2 (two) times daily as needed.     venlafaxine  XR (EFFEXOR -XR) 75 MG 24 hr capsule Take 2 capsules (150 mg total) by mouth daily. 180 capsule 0    No current facility-administered medications on file prior to visit.    ALLERGIES: Allergies  Allergen Reactions   Zanaflex  [Tizanidine ]     hallucinations   Adhesive [Tape] Itching    FAMILY HISTORY: Family History  Adopted: Yes  Problem Relation Age of Onset   COPD Mother    Emphysema Mother    Heart attack Mother    Other Mother        tobacco abuse    SOCIAL HISTORY: Social History   Socioeconomic History   Marital status: Married    Spouse name: Not on file   Number of children: 2   Years of education: Not on file   Highest education level: Not on file  Occupational History   Occupation: Retired  Tobacco Use   Smoking status: Never   Smokeless tobacco: Never  Vaping Use   Vaping status: Never Used  Substance and Sexual Activity   Alcohol use: No   Drug use: No   Sexual activity: Never    Partners: Male  Other Topics Concern   Not on file  Social History Narrative   Living w/ husband, granddaughter.--2 story 14 steps   Right hand   Caffeine 3 soda a day   Social Drivers of Corporate investment banker Strain: Low Risk  (03/22/2024)   Received from [facility]   Overall Financial Resource Strain (CARDIA)    Difficulty of Paying Living Expenses: Not hard at all  Food Insecurity: No Food Insecurity (03/22/2024)   Received from [facility]   Hunger Vital Sign    Within the past 12 months, you worried that your food would run out before you got the money to buy more.: Never true    Within the past 12 months, the food you bought just didn't last and you didn't have money to get more.: Never true  Transportation Needs: No Transportation Needs (03/22/2024)   Received from [facility] - Transportation    Lack of Transportation (Medical): No    Lack of Transportation (Non-Medical): No  Physical Activity: Inactive (09/07/2021)   Exercise Vital Sign  Days of Exercise per Week: 0 days    Minutes of Exercise per Session: 0 min  Stress: No  Stress Concern Present (09/07/2021)   [employer] of Occupational Health - Occupational Stress Questionnaire    Feeling of Stress : Not at all  Social Connections: Moderately Isolated (09/07/2021)   Social Connection and Isolation Panel    Frequency of Communication with Friends and Family: More than three times a week    Frequency of Social Gatherings with Friends and Family: Once a week    Attends Religious Services: Never    Database administrator or Organizations: No    Attends Banker Meetings: Never    Marital Status: Married  Catering manager Violence: Not At Risk (09/27/2022)   Humiliation, Afraid, Rape, and Kick questionnaire    Fear of Current or Ex-Partner: No    Emotionally Abused: No    Physically Abused: No    Sexually Abused: No     PHYSICAL EXAM: Vitals:   07/26/24 0916  BP: 128/77  Pulse: (!) 109  SpO2: 98%   General: No acute distress, sitting on wheelchair Head:  Normocephalic/atraumatic, head flexed to the left Skin/Extremities: No rash, no edema Neurological Exam: Mental status: alert and awake, no dysarthria or aphasia, Fund of knowledge is appropriate. Attention and concentration are normal.     Cranial nerves: CN I: not tested CN II: pupils equal, round, visual fields intact CN III, IV, VI:  full range of motion, no nystagmus, no ptosis CN V: facial sensation intact CN VII: upper and lower face symmetric CN VIII: hearing intact to conversation Bulk & Tone: normal, no fasciculations, no cogwheeling Motor: 5/5 throughout with no pronator drift. Sensation: intact to light touch, cold, pin, vibration sense.  No extinction to double simultaneous stimulation.  Romberg test negative Deep Tendon Reflexes: +2 throughout Cerebellar: no incoordination on finger to nose testing Gait: needs 2-person assist to stand and take a few steps, she has kyphosis with flexion almost 90-degrees parallel to ground, slow cautious steps, no ataxia Tremor:  No resting tremor. There was a very brief fine tremor seen while she was holding a cup to demonstrate, however this was not reproducible, with no further tremors seen, no postural or endpoint tremor on exam today.  Good finger and foot taps, RAMs  IMPRESSION: This is a pleasant 78 year old right-handed woman with a history of hypertension, hyperlipidemia, migraines, complicated spine history s/p SCS/IPG implantation, chronic pain with kyphosis, presenting for evaluation of shaking. She reports mostly an action tremor, however none seen in the office today. No parkinsonian signs. Her husband notes tremors are worse with anxiety and at the end of the day. MRI brain without contrast will be ordered to assess for underlying structural abnormality. We discussed medications used to essential tremor and agreed to hold off for now. Continue working on anxiety/panic attacks with PCP. Continue to monitor for any other triggers for the tremors. Follow-up in 6 months, call for any changes.    Thank you for allowing me to participate in the care of this patient. Please do not hesitate to call for any questions or concerns.   [name], M.D.  CC: Dr. Antonio Meth

## 2024-07-26 NOTE — Patient Instructions (Signed)
 Good to meet you.  Schedule MRI brain without contrast  2. Continue working with Dr. Antonio on anxiety/panic attacks  3. Continue to monitor for any other triggers for the tremors  4. Follow-up in 6 months, call for any changes

## 2024-07-27 ENCOUNTER — Other Ambulatory Visit: Payer: Self-pay | Admitting: Family Medicine

## 2024-07-27 DIAGNOSIS — R197 Diarrhea, unspecified: Secondary | ICD-10-CM

## 2024-07-27 DIAGNOSIS — R519 Headache, unspecified: Secondary | ICD-10-CM

## 2024-07-30 ENCOUNTER — Encounter: Payer: Self-pay | Admitting: Neurology

## 2024-07-30 ENCOUNTER — Ambulatory Visit: Admitting: Family Medicine

## 2024-08-02 ENCOUNTER — Ambulatory Visit: Admitting: Family Medicine

## 2024-08-07 ENCOUNTER — Encounter: Payer: Self-pay | Admitting: Family Medicine

## 2024-08-07 ENCOUNTER — Ambulatory Visit: Admitting: Family Medicine

## 2024-08-07 VITALS — BP 140/80 | HR 77 | Temp 98.1°F | Resp 16 | Ht 65.0 in

## 2024-08-07 DIAGNOSIS — Z23 Encounter for immunization: Secondary | ICD-10-CM

## 2024-08-07 DIAGNOSIS — I10 Essential (primary) hypertension: Secondary | ICD-10-CM

## 2024-08-07 DIAGNOSIS — F329 Major depressive disorder, single episode, unspecified: Secondary | ICD-10-CM

## 2024-08-07 MED ORDER — BREXPIPRAZOLE 1 MG PO TABS: 1.0000 mg | ORAL_TABLET | Freq: Every day | ORAL | 2 refills | Status: DC

## 2024-08-07 NOTE — Progress Notes (Addendum)
 Subjective:    Patient ID: [name], female    DOB: 1946/10/06, 78 y.o.   MRN: [id number]  Chief Complaint  Patient presents with   Depression    HPI Patient is in today for depression.   Discussed the use of AI scribe software for clinical note transcription with the patient, who gave verbal consent to proceed.  History of Present Illness [name] or [name] is a 78 year old female who presents with concerns about her current medication regimen and overall health. She is accompanied by her daughter, [name], and her great grandson, [name].  She feels unwell and is concerned about her current medication regimen, particularly the effectiveness of Effexor  for her anxiety and depression. Effexor  is not working as it used to, and she recalls that Lexapro had previously helped with her anxiety. She is currently taking Effexor , Klonopin , and a muscle relaxer to aid with sleep, but still experiences significant fatigue and low energy.  Her daily routine is challenging due to physical limitations, including the need for a walker and difficulty with balance. She uses a pedal bicycle for exercise, which she finds helpful. Her back and legs are problematic, and she has zero balance, impacting her ability to perform daily activities.  She is dealing with significant stress related to her family. Her son, who is in rehab for drug addiction, is considering leaving the program, causing her distress. She is also responsible for her father, who she believes is in the early stages of dementia, and she has full custody of her great grandson, [name].  She takes oxycodone  for pain management but states it does not alleviate her pain and continues to take it to avoid withdrawal symptoms. She supplements with Tylenol  for additional pain relief. She has discussed her concerns with her pain management provider but has not found a satisfactory solution.  She mentions a recent urinary tract infection  treated by her urologist and expresses concern about her kidney function, which was noted to be suboptimal in previous tests. She attributes this to possible dehydration and has been trying to increase her fluid intake.    Past Medical History:  Diagnosis Date   Abrasion of right heel    during admission 10/ 2019 right heel open skin due to movement on sheet   ADD (attention deficit disorder)    Anemia, mild    Anxiety    Arthritis    joints, back   At high risk for falls    10-19-2018 pt has fell twice in past week, tripped   Chronic back pain    all over my back (10/02/2013)   Crushing injury of arm, right 02/06/1989's   it was crushed; wore cast from fingers to top of my shoulder (11/25/2   Crushing injury of left wrist 05/11/1989's   DOE (dyspnea on exertion)    Essential tremor    Fibromyalgia    History of palpitations 2002   recurrent   History of panic attacks    History of pneumonia 08/21/2018   w/ acute respiratory failure/ hypoxia   History of sepsis    admission 08-21-2018  secondary to uropathy obstructive from ureteral stone    Hyperlipidemia    Hypertension    10-19-2018 PER PT AMLODIPINE  ON HOLD SINCE 10/ 2019 DUE TO KIDNEY ISSUES PER DOCTOR   Idiopathic scoliosis 04/19/2013   Left ureteral stone    Migraines    once/month (10/02/2013)   Osteoporosis    Other vitamin B12  deficiency anemia    PONV (postoperative nausea and vomiting)    As a child   Pulmonary nodules/lesions, multiple    Renal insufficiency    S/P gastric bypass 09/16/2003   S/P insertion of spinal cord stimulator    per pt remote is missing   Wears dentures    upper   Wears glasses    Wears glasses     Past Surgical History:  Procedure Laterality Date   ABDOMINAL HYSTERECTOMY  1980's   W/  BSO  AND APPENDECTOMY   CARDIAC CATHETERIZATION  12-13-2002    dr delford   normal coronaries   CYSTOSCOPY WITH URETEROSCOPY AND STENT PLACEMENT Left 09/27/2018   Procedure: LEFT   URETEROSCOPY, HOLMIUM LASER LITHOTRIPSY;  Surgeon: Cam Morene ORN, MD;  Location: [facility];  Service: Urology;  Laterality: Left;   D & C HYSTERSCOPY RESECTION MYOMECTOMY  1980s   HUMERUS FRACTURE SURGERY Left 04/17/2019    Comminuted complex left intra-articular distal humerus fracture/supracondylar humerus fracture displaced   INTRAMEDULLARY (IM) NAIL INTERTROCHANTERIC Right 06/01/2019   Procedure: INTRAMEDULLARY (IM) NAIL INTERTROCHANTRIC;  Surgeon: Melodi Lerner, MD;  Location: [facility];  Service: Orthopedics;  Laterality: Right;   IR NEPHROSTOMY PLACEMENT LEFT  08/22/2018   IR NEPHROSTOMY PLACEMENT LEFT  12/08/2018   KNEE ARTHROSCOPY Right 03-31-2001    dr amy @[facility]    NEPHROLITHOTOMY Left 10/20/2018   Procedure: LEFT NEPHROLITHOTOMY PERCUTANEOUS;  Surgeon: Cam Morene ORN, MD;  Location: [facility];  Service: Urology;  Laterality: Left;   ORIF HUMERUS FRACTURE Left 04/17/2019   Procedure: Open reduction internal fixation left supracondylar humerus fracture with olecranon osteotomy and ulnar nerve release and anterior transposition and repair of structures as necessary;  Surgeon: Camella Fallow, MD;  Location: [facility];  Service: Orthopedics;  Laterality: Left;  3 hrs   REDUCTION MAMMAPLASTY Bilateral 2001   ROBOT ASSISTED LAPAROSCOPIC NEPHRECTOMY Left 12/25/2018   Procedure: XI ROBOTIC ASSISTED LAPAROSCOPIC NEPHRECTOMY, LEFT FLANK EXPLORATION WITH REMOVAL OF BATTERY PACK;  Surgeon: Cam Morene ORN, MD;  Location: [facility];  Service: Urology;  Laterality: Left;   ROUX-EN-Y GASTRIC BYPASS  09-16-2003   dr christella. gladis  @[facility]    via Laparoscopy w/ gastrojejunostomy   SPINAL CORD STIMULATOR IMPLANT  2016   10-19-2018  PER PT HAS REMOTE BUT HAS NOT USED IT SINCE DEC 2018   TONSILLECTOMY AND ADENOIDECTOMY  1955   TUBAL LIGATION  1980's    Family History  Adopted: Yes  Problem Relation Age of Onset   COPD Mother    Emphysema Mother    Heart attack Mother    Other Mother        tobacco abuse     Social History   Socioeconomic History   Marital status: Married    Spouse name: Not on file   Number of children: 2   Years of education: Not on file   Highest education level: Not on file  Occupational History   Occupation: Retired  Tobacco Use   Smoking status: Never   Smokeless tobacco: Never  Vaping Use   Vaping status: Never Used  Substance and Sexual Activity   Alcohol use: No   Drug use: No   Sexual activity: Never    Partners: Male  Other Topics Concern   Not on file  Social History Narrative   Living w/ husband, granddaughter.--2 story 14 steps   Right hand   Caffeine 3 soda a day   Social Drivers of Corporate investment banker Strain: Low Risk  (  03/22/2024)   Received from [facility]   Overall Financial Resource Strain (CARDIA)    Difficulty of Paying Living Expenses: Not hard at all  Food Insecurity: No Food Insecurity (03/22/2024)   Received from [facility]   Hunger Vital Sign    Within the past 12 months, you worried that your food would run out before you got the money to buy more.: Never true    Within the past 12 months, the food you bought just didn't last and you didn't have money to get more.: Never true  Transportation Needs: No Transportation Needs (03/22/2024)   Received from [facility] - Transportation    Lack of Transportation (Medical): No    Lack of Transportation (Non-Medical): No  Physical Activity: Inactive (09/07/2021)   Exercise Vital Sign    Days of Exercise per Week: 0 days    Minutes of Exercise per Session: 0 min  Stress: No Stress Concern Present (09/07/2021)   [employer] of Occupational Health - Occupational Stress Questionnaire    Feeling of Stress : Not at all  Social Connections: Moderately Isolated (09/07/2021)   Social Connection and Isolation Panel    Frequency of Communication with Friends and Family: More than three times a week    Frequency of Social Gatherings with Friends and Family:  Once a week    Attends Religious Services: Never    Database administrator or Organizations: No    Attends Banker Meetings: Never    Marital Status: Married  Catering manager Violence: Not At Risk (09/27/2022)   Humiliation, Afraid, Rape, and Kick questionnaire    Fear of Current or Ex-Partner: No    Emotionally Abused: No    Physically Abused: No    Sexually Abused: No    Outpatient Medications Prior to Visit  Medication Sig Dispense Refill   amphetamine -dextroamphetamine  (ADDERALL  XR) 20 MG 24 hr capsule 1 po bid 60 capsule 0   Baclofen  5 MG TABS Take 1 tablet by mouth 2 (two) times daily as needed.     clonazePAM  (KLONOPIN ) 0.5 MG tablet TAKE 1 TABLET BY MOUTH 3 TIMES A DAY AS NEEDED 90 tablet 1   loperamide  (IMODIUM ) 2 MG capsule TAKE 1 CAPSULE BY MOUTH DAILY AS NEEDED FOR DIARRHEA OR LOOSE STOOLS 12 capsule 2   losartan  (COZAAR ) 50 MG tablet Take 1 tablet (50 mg total) by mouth daily. 90 tablet 1   ondansetron  (ZOFRAN -ODT) 8 MG disintegrating tablet Take 1 tablet (8 mg total) by mouth every 8 (eight) hours as needed for nausea or vomiting. 12 tablet 2   oxyCODONE  (ROXICODONE ) 15 MG immediate release tablet Take 1 tablet (15 mg total) by mouth 4 (four) times daily. 120 tablet 0   SUMAtriptan  (IMITREX ) 50 MG tablet TAKE 1 TABLET BY MOUTH AT ONSET OF MIGRAINE; MAY REPEAT 1 TABLET IN 2 HOURS IF NEEDED. 10 tablet 2   tiZANidine  (ZANAFLEX ) 4 MG tablet Take 4 mg by mouth.     venlafaxine  XR (EFFEXOR -XR) 75 MG 24 hr capsule Take 2 capsules (150 mg total) by mouth daily. 180 capsule 0   traMADol  (ULTRAM ) 50 MG tablet Take 50 mg by mouth 2 (two) times daily as needed.     No facility-administered medications prior to visit.    Allergies  Allergen Reactions   Zanaflex  [Tizanidine ]     hallucinations   Adhesive [Tape] Itching    Review of Systems  Constitutional:  Negative for fever and malaise/fatigue.  HENT:  Negative for congestion.   Eyes:  Negative for blurred  vision.  Respiratory:  Negative for cough and shortness of breath.   Cardiovascular:  Negative for chest pain, palpitations and leg swelling.  Gastrointestinal:  Negative for abdominal pain, blood in stool, nausea and vomiting.  Genitourinary:  Negative for dysuria and frequency.  Musculoskeletal:  Negative for back pain and falls.  Skin:  Negative for rash.  Neurological:  Negative for dizziness, loss of consciousness and headaches.  Endo/Heme/Allergies:  Negative for environmental allergies.  Psychiatric/Behavioral:  Positive for depression. The patient is nervous/anxious.        Objective:    Physical Exam Vitals and nursing note reviewed.  Constitutional:      General: She is not in acute distress.    Appearance: Normal appearance. She is well-developed.  HENT:     Head: Normocephalic and atraumatic.  Eyes:     General: No scleral icterus.       Right eye: No discharge.        Left eye: No discharge.  Cardiovascular:     Rate and Rhythm: Normal rate and regular rhythm.     Heart sounds: No murmur heard. Pulmonary:     Effort: Pulmonary effort is normal. No respiratory distress.     Breath sounds: Normal breath sounds.  Musculoskeletal:        General: Normal range of motion.     Cervical back: Normal range of motion and neck supple.     Right lower leg: No edema.     Left lower leg: No edema.  Skin:    General: Skin is warm and dry.  Neurological:     Mental Status: She is alert and oriented to person, place, and time.  Psychiatric:        Mood and Affect: Mood is anxious and depressed.        Behavior: Behavior normal.        Thought Content: Thought content normal.        Judgment: Judgment normal.     BP (!) 140/80 (BP Location: Right Arm, Patient Position: Sitting, Cuff Size: Normal)   Pulse 77   Temp 98.1 F (36.7 C) (Oral)   Resp 16   Ht 5' 5 (1.651 m)   SpO2 92%   BMI 23.13 kg/m  Wt Readings from Last 3 Encounters:  07/26/24 139 lb (63 kg)   02/02/22 138 lb 3.7 oz (62.7 kg)  11/12/21 138 lb 3.2 oz (62.7 kg)    Diabetic Foot Exam - Simple   No data filed    Lab Results  Component Value Date   WBC 4.1 05/22/2024   HGB 13.4 05/22/2024   HCT 41.3 05/22/2024   PLT 196.0 05/22/2024   GLUCOSE 99 05/22/2024   CHOL 177 05/22/2024   TRIG 91.0 05/22/2024   HDL 89.50 05/22/2024   LDLDIRECT 129.1 07/17/2007   LDLCALC 69 05/22/2024   ALT 5 05/22/2024   AST 15 05/22/2024   NA 141 05/22/2024   K 4.6 05/22/2024   CL 110 05/22/2024   CREATININE 1.41 (H) 05/22/2024   BUN 20 05/22/2024   CO2 25 05/22/2024   TSH 0.91 05/22/2024   INR 1.0 05/31/2019   HGBA1C 5.3 04/22/2014    Lab Results  Component Value Date   TSH 0.91 05/22/2024   Lab Results  Component Value Date   WBC 4.1 05/22/2024   HGB 13.4 05/22/2024   HCT 41.3 05/22/2024   MCV 95.2 05/22/2024   PLT  196.0 05/22/2024   Lab Results  Component Value Date   NA 141 05/22/2024   K 4.6 05/22/2024   CO2 25 05/22/2024   GLUCOSE 99 05/22/2024   BUN 20 05/22/2024   CREATININE 1.41 (H) 05/22/2024   BILITOT 0.6 05/22/2024   ALKPHOS 189 (H) 05/22/2024   AST 15 05/22/2024   ALT 5 05/22/2024   PROT 7.1 05/22/2024   ALBUMIN 4.3 05/22/2024   CALCIUM 9.0 05/22/2024   ANIONGAP 9 02/01/2022   GFR 36.28 (L) 05/22/2024   Lab Results  Component Value Date   CHOL 177 05/22/2024   Lab Results  Component Value Date   HDL 89.50 05/22/2024   Lab Results  Component Value Date   LDLCALC 69 05/22/2024   Lab Results  Component Value Date   TRIG 91.0 05/22/2024   Lab Results  Component Value Date   CHOLHDL 2 05/22/2024   Lab Results  Component Value Date   HGBA1C 5.3 04/22/2014       Assessment & Plan:  Treatment-resistant depression -     Brexpiprazole; Take 1 tablet (1 mg total) by mouth daily.  Dispense: 30 tablet; Refill: 2  Need for influenza vaccination -     Flu vaccine HIGH DOSE PF(Fluzone Trivalent)  Primary hypertension -     CBC with  Differential/Platelet -     Comprehensive metabolic panel with GFR  Assessment and Plan Assessment & Plan Major depressive disorder with inadequate response to current therapy   Chronic major depressive disorder shows inadequate response to Effexor , with withdrawal symptoms noted. Consider switching medications. Initiate Rexulti at 1 mg daily and continue Effexor . Monitor Rexulti response and adjust dosage as needed. If discontinuation of Effexor  is necessary, consider tapering with the addition of Prozac. Use MyChart to report progress early next week. Schedule a follow-up in one month, with an option for a virtual visit. Anxiety disorder   Anxiety disorder is managed with clonazepam , primarily used at night for sleep, with occasional daytime use. Sedative effects contribute to daytime grogginess. Continue clonazepam  as currently prescribed, primarily for nighttime use.  Chronic kidney disease, unspecified stage   Chronic kidney disease, unspecified stage, with previous labs indicating possible dehydration affecting kidney function. Recent urinary tract infection treated by a urologist. Order repeat kidney function tests.  General Health Maintenance   Administer annual influenza vaccination.    Hilery Wintle R Lowne Chase, DO

## 2024-08-08 ENCOUNTER — Other Ambulatory Visit (HOSPITAL_COMMUNITY): Payer: Self-pay

## 2024-08-08 ENCOUNTER — Telehealth: Payer: Self-pay

## 2024-08-08 LAB — CBC WITH DIFFERENTIAL/PLATELET
Basophils Absolute: 0 K/uL (ref 0.0–0.1)
Basophils Relative: 0.8 % (ref 0.0–3.0)
Eosinophils Absolute: 0.4 K/uL (ref 0.0–0.7)
Eosinophils Relative: 6 % — ABNORMAL HIGH (ref 0.0–5.0)
HCT: 40.1 % (ref 36.0–46.0)
Hemoglobin: 13.1 g/dL (ref 12.0–15.0)
Lymphocytes Relative: 26.1 % (ref 12.0–46.0)
Lymphs Abs: 1.5 K/uL (ref 0.7–4.0)
MCHC: 32.6 g/dL (ref 30.0–36.0)
MCV: 98.9 fl (ref 78.0–100.0)
Monocytes Absolute: 0.6 K/uL (ref 0.1–1.0)
Monocytes Relative: 10.3 % (ref 3.0–12.0)
Neutro Abs: 3.3 K/uL (ref 1.4–7.7)
Neutrophils Relative %: 56.8 % (ref 43.0–77.0)
Platelets: 208 K/uL (ref 150.0–400.0)
RBC: 4.06 Mil/uL (ref 3.87–5.11)
RDW: 19.4 % — ABNORMAL HIGH (ref 11.5–15.5)
WBC: 5.9 K/uL (ref 4.0–10.5)

## 2024-08-08 LAB — COMPREHENSIVE METABOLIC PANEL WITH GFR
ALT: 5 U/L (ref 0–35)
AST: 15 U/L (ref 0–37)
Albumin: 4.3 g/dL (ref 3.5–5.2)
Alkaline Phosphatase: 132 U/L — ABNORMAL HIGH (ref 39–117)
BUN: 21 mg/dL (ref 6–23)
CO2: 25 meq/L (ref 19–32)
Calcium: 9.4 mg/dL (ref 8.4–10.5)
Chloride: 109 meq/L (ref 96–112)
Creatinine, Ser: 1.47 mg/dL — ABNORMAL HIGH (ref 0.40–1.20)
GFR: 34.46 mL/min — ABNORMAL LOW (ref >60.00)
Glucose, Bld: 93 mg/dL (ref 70–99)
Potassium: 4.1 meq/L (ref 3.5–5.1)
Sodium: 142 meq/L (ref 135–145)
Total Bilirubin: 0.6 mg/dL (ref 0.2–1.2)
Total Protein: 7 g/dL (ref 6.0–8.3)

## 2024-08-08 NOTE — Telephone Encounter (Signed)
 Pharmacy Patient Advocate Encounter   Received notification from CoverMyMeds that prior authorization for Rexulti 1MG  tablets is required/requested.   Insurance verification completed.   The patient is insured through [employer].   Per test claim: PA required; PA submitted to above mentioned insurance via Latent Key/confirmation #/EOC [facility] Status is pending

## 2024-08-09 ENCOUNTER — Ambulatory Visit
Admission: RE | Admit: 2024-08-09 | Discharge: 2024-08-09 | Disposition: A | Source: Ambulatory Visit | Attending: Neurology

## 2024-08-09 DIAGNOSIS — R251 Tremor, unspecified: Secondary | ICD-10-CM

## 2024-08-09 NOTE — Telephone Encounter (Signed)
 Pharmacy Patient Advocate Encounter  Received notification from [employer] that Prior Authorization for REXULTI has been APPROVED from 08/09/24 to 11/07/24

## 2024-08-12 NOTE — Addendum Note (Signed)
 Addended by: ANTONIO CYNDEE ROCKERS R on: 08/12/2024 08:54 PM   Modules accepted: Level of Service

## 2024-08-17 ENCOUNTER — Ambulatory Visit: Payer: Self-pay | Admitting: Neurology

## 2024-08-18 ENCOUNTER — Ambulatory Visit: Payer: Self-pay | Admitting: Family Medicine

## 2024-08-20 ENCOUNTER — Other Ambulatory Visit: Payer: Self-pay | Admitting: Family Medicine

## 2024-08-20 DIAGNOSIS — F411 Generalized anxiety disorder: Secondary | ICD-10-CM

## 2024-08-21 DIAGNOSIS — G894 Chronic pain syndrome: Secondary | ICD-10-CM | POA: Diagnosis not present

## 2024-08-22 ENCOUNTER — Ambulatory Visit: Payer: Self-pay

## 2024-08-22 NOTE — Telephone Encounter (Signed)
 Unable to reach patient for triage x 3 attempts.

## 2024-08-22 NOTE — Telephone Encounter (Signed)
 noted

## 2024-08-22 NOTE — Telephone Encounter (Signed)
 Patient called, left VM to return the call to the office to speak to NT.     Message from Alfonso ORN sent at 08/22/2024 10:53 AM EDT  Reason for Triage: pt stated that her blood pressure has been between 175-195. wants to know if she should take 2 blood pressure medicine tablets

## 2024-08-22 NOTE — Telephone Encounter (Signed)
 Pt called an informed that brain MRI overall looks good, no tumor, stroke, or bleed. It shows changes seen in patients with history of hypertension, continue control of BP. Pt verbalized understanding

## 2024-08-23 ENCOUNTER — Emergency Department (HOSPITAL_BASED_OUTPATIENT_CLINIC_OR_DEPARTMENT_OTHER)
Admission: EM | Admit: 2024-08-23 | Discharge: 2024-08-23 | Disposition: A | Discharge status: Home / Self Care | Attending: Emergency Medicine | Admitting: Emergency Medicine

## 2024-08-23 ENCOUNTER — Emergency Department (HOSPITAL_BASED_OUTPATIENT_CLINIC_OR_DEPARTMENT_OTHER)

## 2024-08-23 ENCOUNTER — Encounter (HOSPITAL_BASED_OUTPATIENT_CLINIC_OR_DEPARTMENT_OTHER): Payer: Self-pay | Admitting: Emergency Medicine

## 2024-08-23 ENCOUNTER — Other Ambulatory Visit: Payer: Self-pay

## 2024-08-23 ENCOUNTER — Ambulatory Visit: Payer: Self-pay

## 2024-08-23 DIAGNOSIS — I517 Cardiomegaly: Secondary | ICD-10-CM | POA: Diagnosis not present

## 2024-08-23 DIAGNOSIS — I10 Essential (primary) hypertension: Secondary | ICD-10-CM | POA: Insufficient documentation

## 2024-08-23 DIAGNOSIS — Z79899 Other long term (current) drug therapy: Secondary | ICD-10-CM | POA: Insufficient documentation

## 2024-08-23 DIAGNOSIS — R0602 Shortness of breath: Secondary | ICD-10-CM | POA: Diagnosis not present

## 2024-08-23 DIAGNOSIS — I700 Atherosclerosis of aorta: Secondary | ICD-10-CM | POA: Diagnosis not present

## 2024-08-23 LAB — CBC
HCT: 42.4 % (ref 36.0–46.0)
Hemoglobin: 13.6 g/dL (ref 12.0–15.0)
MCH: 32.2 pg (ref 26.0–34.0)
MCHC: 32.1 g/dL (ref 30.0–36.0)
MCV: 100.2 fL — ABNORMAL HIGH (ref 80.0–100.0)
Platelets: 179 K/uL (ref 150–400)
RBC: 4.23 MIL/uL (ref 3.87–5.11)
RDW: 16.1 % — ABNORMAL HIGH (ref 11.5–15.5)
WBC: 5.5 K/uL (ref 4.0–10.5)
nRBC: 0 % (ref 0.0–0.2)

## 2024-08-23 LAB — BASIC METABOLIC PANEL WITH GFR
Anion gap: 15 (ref 5–15)
BUN: 17 mg/dL (ref 8–23)
CO2: 20 mmol/L — ABNORMAL LOW (ref 22–32)
Calcium: 8.8 mg/dL — ABNORMAL LOW (ref 8.9–10.3)
Chloride: 105 mmol/L (ref 98–111)
Creatinine, Ser: 1.31 mg/dL — ABNORMAL HIGH (ref 0.44–1.00)
GFR, Estimated: 42 mL/min — ABNORMAL LOW (ref >60)
Glucose, Bld: 87 mg/dL (ref 70–99)
Potassium: 4 mmol/L (ref 3.5–5.1)
Sodium: 140 mmol/L (ref 135–145)

## 2024-08-23 LAB — TROPONIN T, HIGH SENSITIVITY: Troponin T High Sensitivity: <15 ng/L (ref 0–19)

## 2024-08-23 NOTE — Telephone Encounter (Signed)
 FYI Only or Action Required?: FYI only for provider.  Patient was last seen in primary care on 08/07/2024 by Antonio Meth, Jamee SAUNDERS, DO.  Called Nurse Triage reporting Hypertension.  Symptoms began several weeks ago.  Interventions attempted: Prescription medications: Losartan .  Symptoms are: gradually worsening.  Triage Disposition: Go to ED Now (Notify PCP)  Patient/caregiver understands and will follow disposition?: YesCopied from CRM [id number]. Topic: Clinical - Red Word Triage >> Aug 23, 2024  1:02 PM Nessti S wrote: [facility] that prompted transfer to Nurse Triage: hbp 170/94 pulse is 88 Reason for Disposition  [1] Systolic BP >= 160 OR Diastolic >= 100 AND [2] cardiac (e.g., breathing difficulty, chest pain) or neurologic symptoms (e.g., new-onset blurred or double vision, unsteady gait)  Answer Assessment - Initial Assessment Questions Advised ED. Pt reports someone can drive her today.  1. BLOOD PRESSURE: What is your blood pressure? Did you take at least two measurements 5 minutes apart?     170-94, 194/94  2. ONSET: When did you take your blood pressure?     2 weeks ago at OV notived her BP was higher than normal. Took BP today and is 194/94, HR 70-80's.  3. HOW: How did you take your blood pressure? (e.g., automatic home BP monitor, visiting nurse)     Checks daily using automatic cuff.  4. HISTORY: Do you have a history of high blood pressure?     No  5. MEDICINES: Are you taking any medicines for blood pressure? Have you missed any doses recently?     Losartan  50 mg daily. Pt reports she has been taking losartan  twice daily for the past week d/t BP running higher. Has not discussed with her PCP. Also recently starting Rexulti on 10/6.  6. OTHER SYMPTOMS: Do you have any symptoms? (e.g., blurred vision, chest pain, difficulty breathing, headache, weakness)     SOB and panic attacks for past several months. Reports feeling a little SOB now. Denies CP  headaches dizziness or changes to vision.  Protocols used: Blood Pressure - High-A-AH

## 2024-08-23 NOTE — ED Triage Notes (Signed)
 Pt reports elevated bp x 3 weeks. Also c/o shob, dyspnea on exertion x 3 weeks.    Started taking rexulti 2 weeks ago.

## 2024-08-23 NOTE — ED Notes (Signed)
 Pt alert and oriented X 4 at the time of discharge. RR even and unlabored. No acute distress noted. Pt verbalized understanding of discharge instructions as discussed. Pt transported in wheelchair to lobby at time of discharge.

## 2024-08-23 NOTE — Telephone Encounter (Signed)
FYI. Pt going to ED.  

## 2024-08-23 NOTE — ED Provider Notes (Signed)
 [facility] Provider Note   CSN: [id number] Arrival date & time: 08/23/24  1531     Patient presents with: Hypertension   [name] is a 78 y.o. female.   Patient has been struggling with elevated blood pressure for 3 weeks and has also at times had some dyspnea and some shortness of breath with exertion.  Patient is on Cozaar  50 mg daily.  But she reports that she has at times his double up on it but when she does she takes 1 in the morning 1 at night usually a daily medicine.  Patient does have primary care doctor.  They have been following her pressures closely.  Patient states she has been getting numbers around 1 7180 pretty consistently for the systolic.  Patient denies any serious headache any significant chest pain really even the shortness of breath with exertion she says is pretty minimal.  Patient does not use home oxygen.  Past medical history significant fibromyalgia chronic back pain.  History of hypertension hyperlipidemia.  Status post gastric bypass in 2004.  Patient used to be on Adderall  for attention deficit disorder but she is no longer taking that.  Has a history of migraines.  But no problem with that currently.  Noted to have renal insufficiency.  Surgical history significant for spinal cord stimulator implant 2016.  Left nephrectomy in 2020, hysterectomy.       Prior to Admission medications   Medication Sig Start Date End Date Taking? Authorizing Provider  amphetamine -dextroamphetamine  (ADDERALL  XR) 20 MG 24 hr capsule 1 po bid 06/21/24   Antonio Meth, [name], DO  Baclofen  5 MG TABS Take 1 tablet by mouth 2 (two) times daily as needed. 05/14/23   [provider]  brexpiprazole (REXULTI) 1 MG TABS tablet Take 1 tablet (1 mg total) by mouth daily. 08/07/24   Antonio Meth Rockers R, DO  clonazePAM  (KLONOPIN ) 0.5 MG tablet TAKE 1 TABLET BY MOUTH 3 TIMES A DAY AS NEEDED 07/06/24   Antonio Meth, [name],  DO  loperamide  (IMODIUM ) 2 MG capsule TAKE 1 CAPSULE BY MOUTH DAILY AS NEEDED FOR DIARRHEA OR LOOSE STOOLS 07/27/24   Antonio Meth, Rockers SAUNDERS, DO  losartan  (COZAAR ) 50 MG tablet Take 1 tablet (50 mg total) by mouth daily. 05/10/24   Lowne Chase, [name], DO  ondansetron  (ZOFRAN -ODT) 8 MG disintegrating tablet Take 1 tablet (8 mg total) by mouth every 8 (eight) hours as needed for nausea or vomiting. 05/10/24   Antonio Meth Rockers SAUNDERS, DO  oxyCODONE  (ROXICODONE ) 15 MG immediate release tablet Take 1 tablet (15 mg total) by mouth 4 (four) times daily. 04/19/19   Camella Fallow, MD  SUMAtriptan  (IMITREX ) 50 MG tablet TAKE 1 TABLET BY MOUTH AT ONSET OF MIGRAINE; MAY REPEAT 1 TABLET IN 2 HOURS IF NEEDED. 07/27/24   Antonio Meth, Rockers SAUNDERS, DO  tiZANidine  (ZANAFLEX ) 4 MG tablet Take 4 mg by mouth. 07/05/24   [provider]  venlafaxine  XR (EFFEXOR -XR) 75 MG 24 hr capsule Take 2 capsules (150 mg total) by mouth daily. 08/20/24   Antonio Meth Rockers SAUNDERS, DO    Allergies: Zanaflex  [tizanidine ] and Adhesive [tape]    Review of Systems  Constitutional:  Negative for chills and fever.  HENT:  Negative for ear pain and sore throat.   Eyes:  Negative for pain and visual disturbance.  Respiratory:  Positive for shortness of breath. Negative for cough.   Cardiovascular:  Positive for  leg swelling. Negative for chest pain and palpitations.  Gastrointestinal:  Negative for abdominal pain and vomiting.  Genitourinary:  Negative for dysuria and hematuria.  Musculoskeletal:  Negative for arthralgias and back pain.  Skin:  Negative for color change and rash.  Neurological:  Negative for seizures and syncope.  All other systems reviewed and are negative.   Updated Vital Signs BP (!) 218/115   Pulse 70   Temp 98.4 F (36.9 C) (Oral)   Resp (!) 21   Ht 1.651 m (5' 5)   Wt 63 kg   SpO2 95%   BMI 23.13 kg/m   Physical Exam Vitals and nursing note reviewed.  Constitutional:      General: She is not in  acute distress.    Appearance: Normal appearance. She is well-developed.  HENT:     Head: Normocephalic and atraumatic.  Eyes:     Extraocular Movements: Extraocular movements intact.     Conjunctiva/sclera: Conjunctivae normal.     Pupils: Pupils are equal, round, and reactive to light.  Cardiovascular:     Rate and Rhythm: Normal rate and regular rhythm.     Heart sounds: No murmur heard. Pulmonary:     Effort: Pulmonary effort is normal. No respiratory distress.     Breath sounds: Normal breath sounds. No wheezing or rales.  Abdominal:     Palpations: Abdomen is soft.     Tenderness: There is no abdominal tenderness.  Musculoskeletal:        General: No swelling.     Cervical back: Normal range of motion and neck supple.     Left lower leg: Edema present.     Comments: Trace edema to bilateral lower extremity.  Skin:    General: Skin is warm and dry.     Capillary Refill: Capillary refill takes less than 2 seconds.  Neurological:     General: No focal deficit present.     Mental Status: She is alert and oriented to person, place, and time.     Cranial Nerves: No cranial nerve deficit.     Motor: No weakness.  Psychiatric:        Mood and Affect: Mood normal.     (all labs ordered are listed, but only abnormal results are displayed) Labs Reviewed  BASIC METABOLIC PANEL WITH GFR - Abnormal; Notable for the following components:      Result Value   CO2 20 (*)    Creatinine, Ser 1.31 (*)    Calcium 8.8 (*)    GFR, Estimated 42 (*)    All other components within normal limits  CBC - Abnormal; Notable for the following components:   MCV 100.2 (*)    RDW 16.1 (*)    All other components within normal limits  TROPONIN T, HIGH SENSITIVITY  TROPONIN T, HIGH SENSITIVITY    EKG: EKG Interpretation Date/Time:  Thursday August 23 2024 15:55:42 EDT Ventricular Rate:  79 PR Interval:  174 QRS Duration:  84 QT Interval:  363 QTC Calculation: 417 R Axis:   -35  Text  Interpretation: Sinus rhythm Atrial premature complexes Left axis deviation Confirmed by Aubreyana Saltz [phone]) on 08/23/2024 4:12:53 PM  Radiology: [facility] Chest 2 View Result Date: 08/23/2024 CLINICAL DATA:  Shortness of breath and hypertension. EXAM: CHEST - 2 VIEW COMPARISON:  Chest radiograph dated 02/01/2022. FINDINGS: Mild eventration of the right hemidiaphragm. No focal consolidation, pleural effusion or pneumothorax. Stable cardiomegaly. Atherosclerotic calcification of the aortic arch. No acute osseous pathology. IMPRESSION: 1.  No active cardiopulmonary disease. 2. Cardiomegaly. Electronically Signed   By: Vanetta Chou M.D.   On: 08/23/2024 16:40     Procedures   Medications Ordered in the ED - No data to display                                  Medical Decision Making Amount and/or Complexity of Data Reviewed Labs: ordered. Radiology: ordered.   Patient's room air oxygen here is is anywhere from 92% to 97%.  Mostly in the upper 90s.  Patient not tachycardic.  Patient's blood pressure in the room are actually quite elevated like 218/115.  Basic metabolic panel GFR 42 do not have renal insufficiency this is actually a little better than her baseline.  Electrolytes otherwise normal CBC white count normal hemoglobin 13.6 platelets 179.  Troponin less than 15 which is very reassuring.  We do not need a second troponin because patient symptoms been going on for 3 weeks and there is no chest pain.  Chest x-ray no active cardiopulmonary disease.  Which is very reassuring.  Patient is going to be recommended to do her Cozaar  100 mg once a day both at the same time.  And then keep a blood pressure log and close follow-up with her primary care doctor.  Patient's blood pressure is elevated here.  But no endorgan complications.  No evidence of any strokelike symptoms.  Probably best just to slowly adjust her blood pressure with close follow-up with primary care doctor.  Final  diagnoses:  Primary hypertension    ED Discharge Orders     None          Geraldene Hamilton, MD 08/23/24 [phone]

## 2024-08-23 NOTE — Discharge Instructions (Signed)
 Start taking your Cozaar  also known as losartan  as 100 mg once a day at the same time.  Keep your blood pressure log.  Call your primary for follow-up appointment in about a week.  Return for any new or worse symptoms.  Definitely return for any strokelike symptoms, severe chest pain significant difficulty breathing or severe headache.

## 2024-08-27 ENCOUNTER — Ambulatory Visit: Payer: Self-pay

## 2024-08-27 NOTE — Telephone Encounter (Signed)
 FYI Only or Action Required?: Action required by provider: update on patient condition. Dispo ED/UC- refuse- wishes to get rec from MD  Patient was last seen in primary care on 08/07/2024 by Antonio Meth, Jamee SAUNDERS, DO.  Called Nurse Triage reporting Hypertension.  Symptoms began several weeks ago.  Interventions attempted: Prescription medications: Losartan  and Rest, hydration, or home remedies.  Symptoms are: gradually worsening .  Triage Disposition: Go to ED Now (Notify PCP)  Patient/caregiver understands and will follow disposition?: No, wishes to speak with PCP  Copied from CRM #[id number]. Topic: Clinical - Red Word Triage >> Aug 27, 2024  1:36 PM Rea C wrote: Red Word that prompted transfer to Nurse Triage: Patient went to the ER last week for high blood pressure. Was advised to take 100mg  losartan  in the morning and night. Blood pressure is still showing high at 197/117. Reason for Disposition  [1] Systolic BP >= 160 OR Diastolic >= 100 AND [2] cardiac (e.g., breathing difficulty, chest pain) or neurologic symptoms (e.g., new-onset blurred or double vision, unsteady gait)  Answer Assessment - Initial Assessment Questions ED 10/16 for elevated BP and SOB. Was advised to Double up on losartan  and take 100mg  BID. SBP's have still been rising to over 200. She still has some SOB and a mild headache. Advised that patient would need to be seen in the ED. Husband with pt states they did nothing last time expect tell her to smell the roses and blow out the candles and double her BP meds. CAL contacted with no sooner appts. Pt only wants to see Dr Antonio Meth. Advised CAL a HP message being sent.   Patient is going to rest. She is very stressed right now as well and hoping to relax some and the BP will come back down. Husband states he will check again in about an hour after resting to see if it has come down.   DR ANTONIO- please advise on BP   1. BLOOD PRESSURE: What is your blood pressure?  Did you take at least two measurements 5 minutes apart?     Today 190/118 HR 99,  205/118 HR 78  2. ONSET: When did you take your blood pressure?     A couple weeks 3. HOW: How did you take your blood pressure? (e.g., automatic home BP monitor, visiting nurse)     Arm cuff- checking both arms  4. HISTORY: Do you have a history of high blood pressure?     Yes losartan  50mg  BID with intermittently doubling dose. 5. MEDICINES: Are you taking any medicines for blood pressure? Have you missed any doses recently?     Losartan  100mg  BID- with no missed doses since increase 6. OTHER SYMPTOMS: Do you have any symptoms? (e.g., blurred vision, chest pain, difficulty breathing, headache, weakness)     SOB, mild Headache  Protocols used: Blood Pressure - High-A-AH

## 2024-08-28 ENCOUNTER — Other Ambulatory Visit: Payer: Self-pay | Admitting: Neurology

## 2024-08-28 MED ORDER — AMLODIPINE BESYLATE 5 MG PO TABS: 5.0000 mg | ORAL_TABLET | Freq: Every day | ORAL | 2 refills | Status: DC

## 2024-08-28 NOTE — Telephone Encounter (Signed)
 Left message on machine to call back on pt phone and husbands phone.

## 2024-08-28 NOTE — Telephone Encounter (Signed)
 Patient called back and explained note from Dr. Antonio:   Er note says losartan  50 mg daily---- she should increase dose to 100 mg daily in am and add norvasc  5 mg daily #30 2 refills with office visit next week  Patient expressed understanding,  RX sent to the pharmacy, appt scheduled for next week.

## 2024-08-28 NOTE — Addendum Note (Signed)
 Addended by: Lanard Arguijo L on: 08/28/2024 03:23 PM   Modules accepted: Orders

## 2024-08-29 ENCOUNTER — Other Ambulatory Visit: Payer: Self-pay | Admitting: Family Medicine

## 2024-08-29 DIAGNOSIS — F419 Anxiety disorder, unspecified: Secondary | ICD-10-CM

## 2024-08-29 MED ORDER — CLONAZEPAM 0.5 MG PO TABS
0.5000 mg | ORAL_TABLET | Freq: Three times a day (TID) | ORAL | 1 refills | Status: AC | PRN
Start: 2024-08-29 — End: ?

## 2024-08-29 NOTE — Telephone Encounter (Signed)
 Copied from CRM [id number]. Topic: Clinical - Medication Refill >> Aug 29, 2024  9:59 AM Eva FALCON wrote: Medication: clonazePAM  (KLONOPIN ) 0.5 MG tablet  Has the patient contacted their pharmacy? No, states she's aware they won't do it early. (Agent: If no, request that the patient contact the pharmacy for the refill. If patient does not wish to contact the pharmacy document the reason why and proceed with request.) (Agent: If yes, when and what did the pharmacy advise?)  This is the patient's preferred pharmacy:  [id number] - [place], [place] - [address] [place] [address] Phone: [phone] Fax: [phone]   Is this the correct pharmacy for this prescription? Yes If no, delete pharmacy and type the correct one.   Has the prescription been filled recently? Yes  Is the patient out of the medication? Yes  Has the patient been seen for an appointment in the last year OR does the patient have an upcoming appointment? Yes  Can we respond through MyChart? Yes  Agent: Please be advised that Rx refills may take up to 3 business days. We ask that you follow-up with your pharmacy.

## 2024-08-29 NOTE — Telephone Encounter (Signed)
 Requesting: clonazepam  0.5mg   Contract:11/12/21 UDS: 11/12/21 Last Visit: 08/07/24 Next Visit:09/03/24 Last Refill: 07/06/24 #90 and 1RF   Please Advise

## 2024-09-03 ENCOUNTER — Encounter: Payer: Self-pay | Admitting: Family Medicine

## 2024-09-03 ENCOUNTER — Ambulatory Visit: Admitting: Family Medicine

## 2024-09-03 VITALS — BP 142/100 | HR 110 | Temp 98.8°F | Resp 18 | Ht 65.0 in

## 2024-09-03 DIAGNOSIS — F419 Anxiety disorder, unspecified: Secondary | ICD-10-CM | POA: Diagnosis not present

## 2024-09-03 DIAGNOSIS — I10 Essential (primary) hypertension: Secondary | ICD-10-CM

## 2024-09-03 DIAGNOSIS — R251 Tremor, unspecified: Secondary | ICD-10-CM | POA: Diagnosis not present

## 2024-09-03 MED ORDER — LOSARTAN POTASSIUM 100 MG PO TABS: 100.0000 mg | ORAL_TABLET | Freq: Every day | ORAL | 1 refills | Status: DC

## 2024-09-03 MED ORDER — AMLODIPINE BESYLATE 5 MG PO TABS: ORAL_TABLET | ORAL | 1 refills | Status: DC

## 2024-09-03 NOTE — Patient Instructions (Signed)

## 2024-09-03 NOTE — Assessment & Plan Note (Signed)
 Well controlled, no changes to meds. Encouraged heart healthy diet such as the DASH diet and exercise as tolerated.

## 2024-09-03 NOTE — Assessment & Plan Note (Signed)
Refer to neuro

## 2024-09-03 NOTE — Progress Notes (Signed)
 Subjective:    Patient ID: [name], female    DOB: August 27, 1946, 78 y.o.   MRN: [id number]  Chief Complaint  Patient presents with   Hypertension   Follow-up    HPI Patient is in today for f/u bp.  Discussed the use of AI scribe software for clinical note transcription with the patient, who gave verbal consent to proceed.  History of Present Illness [name] [name] or [name] is a 78 year old female with hypertension who presents with elevated blood pressure and stress-related symptoms.  For the past six weeks, she has experienced elevated blood pressure, which she attributes to stress from family issues. A recent emergency room visit recorded her blood pressure at 245/200 mmHg. She describes episodes of blood pressure spikes during stress or excitement, such as discussions about family matters, accompanied by heart pounding in her head and headaches.  Her current medications include losartan  100 mg daily, increased from 50 mg two weeks ago, and amlodipine  5 mg daily. She also takes clonazepam  for sleep, usually a quarter of a tablet, but recently took a whole tablet without noticeable effect.  She experiences significant stress related to her son, who has a history of substance abuse and financial dependency. She recently decided to stop financially supporting him, causing emotional turmoil. Past incidents involving her son, including threats and manipulative behavior, have contributed to her stress.  She reports shaking and tremors affecting daily activities like eating and applying makeup. She exercises on a stationary bike every other day. A follow-up appointment with a neurologist is scheduled in November for further evaluation of these symptoms.  No chest pain or shortness of breath. She reports headaches and feeling heart pounding during episodes of elevated blood pressure. She mentions a history of a brain scan that showed no abnormalities.    Past Medical History:   Diagnosis Date   Abrasion of right heel    during admission 10/ 2019 right heel open skin due to movement on sheet   ADD (attention deficit disorder)    Anemia, mild    Anxiety    Arthritis    joints, back   At high risk for falls    10-19-2018 pt has fell twice in past week, tripped   Chronic back pain    all over my back (10/02/2013)   Crushing injury of arm, right 02/06/1989's   it was crushed; wore cast from fingers to top of my shoulder (11/25/2   Crushing injury of left wrist 05/11/1989's   DOE (dyspnea on exertion)    Essential tremor    Fibromyalgia    History of palpitations 2002   recurrent   History of panic attacks    History of pneumonia 08/21/2018   w/ acute respiratory failure/ hypoxia   History of sepsis    admission 08-21-2018  secondary to uropathy obstructive from ureteral stone    Hyperlipidemia    Hypertension    10-19-2018 PER PT AMLODIPINE  ON HOLD SINCE 10/ 2019 DUE TO KIDNEY ISSUES PER DOCTOR   Idiopathic scoliosis 04/19/2013   Left ureteral stone    Migraines    once/month (10/02/2013)   Osteoporosis    Other vitamin B12 deficiency anemia    PONV (postoperative nausea and vomiting)    As a child   Pulmonary nodules/lesions, multiple    Renal insufficiency    S/P gastric bypass 09/16/2003   S/P insertion of spinal cord stimulator    per pt remote is missing   Wears  dentures    upper   Wears glasses    Wears glasses     Past Surgical History:  Procedure Laterality Date   ABDOMINAL HYSTERECTOMY  1980's   W/  BSO  AND APPENDECTOMY   CARDIAC CATHETERIZATION  12-13-2002    dr delford   normal coronaries   CYSTOSCOPY WITH URETEROSCOPY AND STENT PLACEMENT Left 09/27/2018   Procedure: LEFT  URETEROSCOPY, HOLMIUM LASER LITHOTRIPSY;  Surgeon: Cam Morene ORN, MD;  Location: [facility];  Service: Urology;  Laterality: Left;   D & C HYSTERSCOPY RESECTION MYOMECTOMY  1980s   HUMERUS FRACTURE SURGERY Left 04/17/2019    Comminuted complex left  intra-articular distal humerus fracture/supracondylar humerus fracture displaced   INTRAMEDULLARY (IM) NAIL INTERTROCHANTERIC Right 06/01/2019   Procedure: INTRAMEDULLARY (IM) NAIL INTERTROCHANTRIC;  Surgeon: Melodi Lerner, MD;  Location: [facility];  Service: Orthopedics;  Laterality: Right;   IR NEPHROSTOMY PLACEMENT LEFT  08/22/2018   IR NEPHROSTOMY PLACEMENT LEFT  12/08/2018   KNEE ARTHROSCOPY Right 03-31-2001    dr amy @[facility]    NEPHROLITHOTOMY Left 10/20/2018   Procedure: LEFT NEPHROLITHOTOMY PERCUTANEOUS;  Surgeon: Cam Morene ORN, MD;  Location: [facility];  Service: Urology;  Laterality: Left;   ORIF HUMERUS FRACTURE Left 04/17/2019   Procedure: Open reduction internal fixation left supracondylar humerus fracture with olecranon osteotomy and ulnar nerve release and anterior transposition and repair of structures as necessary;  Surgeon: Camella Fallow, MD;  Location: [facility];  Service: Orthopedics;  Laterality: Left;  3 hrs   REDUCTION MAMMAPLASTY Bilateral 2001   ROBOT ASSISTED LAPAROSCOPIC NEPHRECTOMY Left 12/25/2018   Procedure: XI ROBOTIC ASSISTED LAPAROSCOPIC NEPHRECTOMY, LEFT FLANK EXPLORATION WITH REMOVAL OF BATTERY PACK;  Surgeon: Cam Morene ORN, MD;  Location: [facility];  Service: Urology;  Laterality: Left;   ROUX-EN-Y GASTRIC BYPASS  09-16-2003   dr christella. gladis  @[facility]    via Laparoscopy w/ gastrojejunostomy   SPINAL CORD STIMULATOR IMPLANT  2016   10-19-2018  PER PT HAS REMOTE BUT HAS NOT USED IT SINCE DEC 2018   TONSILLECTOMY AND ADENOIDECTOMY  1955   TUBAL LIGATION  1980's    Family History  Adopted: Yes  Problem Relation Age of Onset   COPD Mother    Emphysema Mother    Heart attack Mother    Other Mother        tobacco abuse    Social History   Socioeconomic History   Marital status: Married    Spouse name: Not on file   Number of children: 2   Years of education: Not on file   Highest education level: Not on file  Occupational History   Occupation: Retired   Tobacco Use   Smoking status: Never   Smokeless tobacco: Never  Vaping Use   Vaping status: Never Used  Substance and Sexual Activity   Alcohol use: No   Drug use: No   Sexual activity: Never    Partners: Male  Other Topics Concern   Not on file  Social History Narrative   Living w/ husband, granddaughter.--2 story 14 steps   Right hand   Caffeine 3 soda a day   Social Drivers of Corporate Investment Banker Strain: Low Risk  (03/22/2024)   Received from [facility]   Overall Financial Resource Strain (CARDIA)    Difficulty of Paying Living Expenses: Not hard at all  Food Insecurity: No Food Insecurity (03/22/2024)   Received [facility]   Hunger Vital Sign    Within the past 12  months, you worried that your food would run out before you got the money to buy more.: Never true    Within the past 12 months, the food you bought just didn't last and you didn't have money to get more.: Never true  Transportation Needs: No Transportation Needs (03/22/2024)   Received from [facility] - Transportation    Lack of Transportation (Medical): No    Lack of Transportation (Non-Medical): No  Physical Activity: Inactive (09/07/2021)   Exercise Vital Sign    Days of Exercise per Week: 0 days    Minutes of Exercise per Session: 0 min  Stress: No Stress Concern Present (09/07/2021)   [employer] of Occupational [employer] - Occupational Stress Questionnaire    Feeling of Stress : Not at all  Social Connections: Moderately Isolated (09/07/2021)   Social Connection and Isolation Panel    Frequency of Communication with Friends and Family: More than three times a week    Frequency of Social Gatherings with Friends and Family: Once a week    Attends Religious Services: Never    Database Administrator or Organizations: No    Attends Banker Meetings: Never    Marital Status: Married  Catering Manager Violence: Not At Risk (09/27/2022)   Humiliation, Afraid,  Rape, and Kick questionnaire    Fear of Current or Ex-Partner: No    Emotionally Abused: No    Physically Abused: No    Sexually Abused: No    Outpatient Medications Prior to Visit  Medication Sig Dispense Refill   amphetamine -dextroamphetamine  (ADDERALL  XR) 20 MG 24 hr capsule 1 po bid 60 capsule 0   Baclofen  5 MG TABS Take 1 tablet by mouth 2 (two) times daily as needed.     brexpiprazole (REXULTI) 1 MG TABS tablet Take 1 tablet (1 mg total) by mouth daily. 30 tablet 2   clonazePAM  (KLONOPIN ) 0.5 MG tablet Take 1 tablet (0.5 mg total) by mouth 3 (three) times daily as needed. 90 tablet 1   loperamide  (IMODIUM ) 2 MG capsule TAKE 1 CAPSULE BY MOUTH DAILY AS NEEDED FOR DIARRHEA OR LOOSE STOOLS 12 capsule 2   ondansetron  (ZOFRAN -ODT) 8 MG disintegrating tablet Take 1 tablet (8 mg total) by mouth every 8 (eight) hours as needed for nausea or vomiting. 12 tablet 2   oxyCODONE  (ROXICODONE ) 15 MG immediate release tablet Take 1 tablet (15 mg total) by mouth 4 (four) times daily. 120 tablet 0   SUMAtriptan  (IMITREX ) 50 MG tablet TAKE 1 TABLET BY MOUTH AT ONSET OF MIGRAINE; MAY REPEAT 1 TABLET IN 2 HOURS IF NEEDED. 10 tablet 2   tiZANidine  (ZANAFLEX ) 4 MG tablet Take 4 mg by mouth.     venlafaxine  XR (EFFEXOR -XR) 75 MG 24 hr capsule Take 2 capsules (150 mg total) by mouth daily. 180 capsule 1   amLODipine  (NORVASC ) 5 MG tablet Take 1 tablet (5 mg total) by mouth daily. 30 tablet 2   losartan  (COZAAR ) 50 MG tablet Take 1 tablet (50 mg total) by mouth daily. 90 tablet 1   No facility-administered medications prior to visit.    Allergies  Allergen Reactions   Zanaflex  [Tizanidine ]     hallucinations   Adhesive [Tape] Itching    Review of Systems  Constitutional:  Negative for fever and malaise/fatigue.  HENT:  Negative for congestion.   Eyes:  Negative for blurred vision.  Respiratory:  Negative for shortness of breath.   Cardiovascular:  Negative for chest pain, palpitations and  leg  swelling.  Gastrointestinal:  Negative for abdominal pain, blood in stool and nausea.  Genitourinary:  Negative for dysuria and frequency.  Musculoskeletal:  Negative for falls.  Skin:  Negative for rash.  Neurological:  Positive for tremors. Negative for dizziness, loss of consciousness and headaches.  Endo/Heme/Allergies:  Negative for environmental allergies.  Psychiatric/Behavioral:  Positive for depression. The patient is nervous/anxious.        Objective:    Physical Exam Vitals and nursing note reviewed.  Constitutional:      General: She is not in acute distress.    Appearance: Normal appearance. She is well-developed.  HENT:     Head: Normocephalic and atraumatic.  Eyes:     General: No scleral icterus.       Right eye: No discharge.        Left eye: No discharge.  Cardiovascular:     Rate and Rhythm: Normal rate and regular rhythm.     Heart sounds: Murmur heard.  Pulmonary:     Effort: Pulmonary effort is normal. No respiratory distress.     Breath sounds: Normal breath sounds.  Musculoskeletal:        General: Normal range of motion.     Cervical back: Normal range of motion and neck supple.     Right lower leg: No edema.     Left lower leg: No edema.  Skin:    General: Skin is warm and dry.  Neurological:     Mental Status: She is alert and oriented to person, place, and time.  Psychiatric:        Mood and Affect: Mood normal.        Behavior: Behavior normal.        Thought Content: Thought content normal.        Judgment: Judgment normal.     BP (!) 142/100 (BP Location: Right Arm, Patient Position: Sitting, Cuff Size: Normal)   Pulse (!) 110   Temp 98.8 F (37.1 C) (Oral)   Resp 18   Ht 5' 5 (1.651 m)   SpO2 93%   BMI 23.13 kg/m  Wt Readings from Last 3 Encounters:  08/23/24 139 lb (63 kg)  07/26/24 139 lb (63 kg)  02/02/22 138 lb 3.7 oz (62.7 kg)    Diabetic Foot Exam - Simple   No data filed    Lab Results  Component Value Date    WBC 5.5 08/23/2024   HGB 13.6 08/23/2024   HCT 42.4 08/23/2024   PLT 179 08/23/2024   GLUCOSE 87 08/23/2024   CHOL 177 05/22/2024   TRIG 91.0 05/22/2024   HDL 89.50 05/22/2024   LDLDIRECT 129.1 07/17/2007   LDLCALC 69 05/22/2024   ALT 5 08/07/2024   AST 15 08/07/2024   NA 140 08/23/2024   K 4.0 08/23/2024   CL 105 08/23/2024   CREATININE 1.31 (H) 08/23/2024   BUN 17 08/23/2024   CO2 20 (L) 08/23/2024   TSH 0.91 05/22/2024   INR 1.0 05/31/2019   HGBA1C 5.3 04/22/2014    Lab Results  Component Value Date   TSH 0.91 05/22/2024   Lab Results  Component Value Date   WBC 5.5 08/23/2024   HGB 13.6 08/23/2024   HCT 42.4 08/23/2024   MCV 100.2 (H) 08/23/2024   PLT 179 08/23/2024   Lab Results  Component Value Date   NA 140 08/23/2024   K 4.0 08/23/2024   CO2 20 (L) 08/23/2024   GLUCOSE 87 08/23/2024   BUN  17 08/23/2024   CREATININE 1.31 (H) 08/23/2024   BILITOT 0.6 08/07/2024   ALKPHOS 132 (H) 08/07/2024   AST 15 08/07/2024   ALT 5 08/07/2024   PROT 7.0 08/07/2024   ALBUMIN 4.3 08/07/2024   CALCIUM 8.8 (L) 08/23/2024   ANIONGAP 15 08/23/2024   GFR 34.46 (L) 08/07/2024   Lab Results  Component Value Date   CHOL 177 05/22/2024   Lab Results  Component Value Date   HDL 89.50 05/22/2024   Lab Results  Component Value Date   LDLCALC 69 05/22/2024   Lab Results  Component Value Date   TRIG 91.0 05/22/2024   Lab Results  Component Value Date   CHOLHDL 2 05/22/2024   Lab Results  Component Value Date   HGBA1C 5.3 04/22/2014       Assessment & Plan:  Primary hypertension -     Losartan  Potassium; Take 1 tablet (100 mg total) by mouth daily.  Dispense: 90 tablet; Refill: 1 -     Comprehensive metabolic panel with GFR -     Lipid panel -     amLODIPine  Besylate; 1 po bid  Dispense: 180 tablet; Refill: 1  Tremor -     Ambulatory referral to Neurology  Anxiety   Assessment and Plan Assessment & Plan Hypertension   She experiences hypertension  with recent episodes of significantly elevated blood pressure, including a reading of 245/200 mmHg, exacerbated by stress and emotional factors. Emergency evaluation showed no signs of myocardial infarction or cerebrovascular accident. Current medications include losartan  and amlodipine . Increase losartan  to 100 mg daily and amlodipine  to 10 mg daily, with the option to split the dose to minimize potential ankle edema. Recheck blood pressure in two weeks. Encourage stress management techniques such as deep breathing, prayer, and journaling. Discuss the importance of finding a counselor or someone to talk to about stress management.  Tremor   She has a tremor affecting daily activities, including difficulty applying makeup and eating without spilling. The tremor affects her arms, mouth, chin, and legs. No current medication is identified as a cause, and a previous neurology evaluation showed normal brain scan results. Refer to Dr. Tiburcio, a neurologist specializing in movement disorders, for further evaluation.  Depression   Her depression is managed with Effexor  150 mg and Rexulti 1 mg. She reports feeling overly excited and is considering adjusting the Rexulti dosage. There are no signs of addiction to clonazepam , which is used for sleep. Allow her to experiment with reducing Rexulti to 0.5 mg to assess the impact on symptoms. Continue the current Effexor  dosage and clonazepam  as needed for sleep.  General Health Maintenance   She is up to date on pneumonia and influenza vaccinations but has no history of shingles or tetanus vaccinations. Her last mammogram was in 2014. Discussed the new RSV vaccine and its importance due to age-related risk. Recommend shingles and tetanus vaccinations to be administered at the pharmacy. Encourage scheduling a mammogram at the breast center or downstairs if available. Consider RSV vaccination, especially given age-related risk.   Nixxon Faria R Lowne Chase, DO

## 2024-09-03 NOTE — Assessment & Plan Note (Signed)
 Worsening  Inc effexor  F/u

## 2024-09-17 ENCOUNTER — Ambulatory Visit: Admitting: Family Medicine

## 2024-09-21 ENCOUNTER — Ambulatory Visit: Admitting: Family Medicine

## 2024-09-25 ENCOUNTER — Encounter: Payer: Self-pay | Admitting: Neurology

## 2024-09-25 ENCOUNTER — Ambulatory Visit

## 2024-09-25 VITALS — Ht 65.0 in | Wt 135.0 lb

## 2024-09-25 DIAGNOSIS — Z0000 Encounter for general adult medical examination without abnormal findings: Secondary | ICD-10-CM

## 2024-09-25 NOTE — Patient Instructions (Addendum)
 [name],  Thank you for taking the time for your Medicare Wellness Visit. I appreciate your continued commitment to your health goals. Please review the care plan we discussed, and feel free to reach out if I can assist you further.  Please note that Annual Wellness Visits do not include a physical exam. Some assessments may be limited, especially if the visit was conducted virtually. If needed, we may recommend an in-person follow-up with your provider.  Ongoing Care Seeing your primary care provider every 3 to 6 months helps us  monitor your health and provide consistent, personalized care.    Referrals If a referral was made during today's visit and you haven't received any updates within two weeks, please contact the referred provider directly to check on the status.  Recommended Screenings:  Health Maintenance  Topic Date Due   Breast Cancer Screening  04/09/2014   DTaP/Tdap/Td vaccine (2 - Tdap) 10/29/2019   COVID-19 Vaccine (3 - Moderna risk series) 10/20/2020   Zoster (Shingles) Vaccine (2 of 2) 01/25/2022   Medicare Annual Wellness Visit  09/25/2025   Pneumococcal Vaccine for age over 47  Completed   Flu Shot  Completed   DEXA scan (bone density measurement)  Completed   Hepatitis C Screening  Completed   Meningitis B Vaccine  Aged Out   Colon Cancer Screening  Discontinued   Opioid Pain Medicine Management Opioids are powerful medicines that are used to treat moderate to severe pain. When used for short periods of time, they can help you to: Sleep better. Do better in physical or occupational therapy. Feel better in the first few days after an injury. Recover from surgery. Opioids should be taken with the supervision of a trained health care provider. They should be taken for the shortest period of time possible. This is because opioids can be addictive, and the longer you take opioids, the greater your risk of addiction. This addiction can also be called opioid use  disorder. What are the risks? Using opioid pain medicines for longer than 3 days increases your risk of side effects. Side effects include: Constipation. Nausea and vomiting. Breathing difficulties (respiratory depression). Drowsiness. Confusion. Opioid use disorder. Itching. Taking opioid pain medicine for a long period of time can affect your ability to do daily tasks. It also puts you at risk for: Motor vehicle crashes. Depression. Suicide. Heart attack. Overdose, which can be life-threatening. What is a pain treatment plan? A pain treatment plan is an agreement between you and your health care provider. Pain is unique to each person, and treatments vary depending on your condition. To manage your pain, you and your health care provider need to work together. To help you do this: Discuss the goals of your treatment, including how much pain you might expect to have and how you will manage the pain. Review the risks and benefits of taking opioid medicines. Remember that a good treatment plan uses more than one approach and minimizes the chance of side effects. Be honest about the amount of medicines you take and about any drug or alcohol use. Get pain medicine prescriptions from only one health care provider. Pain can be managed with many types of alternative treatments. Ask your health care provider to refer you to one or more specialists who can help you manage pain through: Physical or occupational therapy. Counseling (cognitive behavioral therapy). Good nutrition. Biofeedback. Massage. Meditation. Non-opioid medicine. Following a gentle exercise program. How to use opioid pain medicine Taking medicine Take your pain medicine  exactly as told by your health care provider. Take it only when you need it. If your pain gets less severe, you may take less than your prescribed dose if your health care provider approves. If you are not having pain, do nottake pain medicine unless your  health care provider tells you to take it. If your pain is severe, do nottry to treat it yourself by taking more pills than instructed on your prescription. Contact your health care provider for help. Write down the times when you take your pain medicine. It is easy to become confused while on pain medicine. Writing the time can help you avoid overdose. Take other over-the-counter or prescription medicines only as told by your health care provider. Keeping yourself and others safe  While you are taking opioid pain medicine: Do not drive, use machinery, or power tools. Do not sign legal documents. Do not drink alcohol. Do not take sleeping pills. Do not supervise children by yourself. Do not do activities that require climbing or being in high places. Do not go to a lake, river, ocean, spa, or swimming pool. Do not share your pain medicine with anyone. Keep pain medicine in a locked cabinet or in a secure area where pets and children cannot reach it. Stopping your use of opioids If you have been taking opioid medicine for more than a few weeks, you may need to slowly decrease (taper) how much you take until you stop completely. Tapering your use of opioids can decrease your risk of symptoms of withdrawal, such as: Pain and cramping in the abdomen. Nausea. Sweating. Sleepiness. Restlessness. Uncontrollable shaking (tremors). Cravings for the medicine. Do not attempt to taper your use of opioids on your own. Talk with your health care provider about how to do this. Your health care provider may prescribe a step-down schedule based on how much medicine you are taking and how long you have been taking it. Getting rid of leftover pills Do not save any leftover pills. Get rid of leftover pills safely by: Taking the medicine to a prescription take-back program. This is usually offered by the county or law enforcement. Bringing them to a pharmacy that has a drug disposal container. Flushing them  down the toilet. Check the label or package insert of your medicine to see whether this is safe to do. Throwing them out in the trash. Check the label or package insert of your medicine to see whether this is safe to do. If it is safe to throw it out, remove the medicine from the original container, put it into a sealable bag or container, and mix it with used coffee grounds, food scraps, dirt, or cat litter before putting it in the trash. Follow these instructions at home: Activity Do exercises as told by your health care provider. Avoid activities that make your pain worse. Return to your normal activities as told by your health care provider. Ask your health care provider what activities are safe for you. General instructions You may need to take these actions to prevent or treat constipation: Drink enough fluid to keep your urine pale yellow. Take over-the-counter or prescription medicines. Eat foods that are high in fiber, such as beans, whole grains, and fresh fruits and vegetables. Limit foods that are high in fat and processed sugars, such as fried or sweet foods. Keep all follow-up visits. This is important. Where to find support If you have been taking opioids for a long time, you may benefit from receiving support for quitting  from a local support group or counselor. Ask your health care provider for a referral to these resources in your area. Where to find more information Centers for Disease Control and Prevention (CDC): footballexhibition.com.br [place] Food and Drug Administration (FDA): pumpkinsearch.com.ee Get help right away if: You may have taken too much of an opioid (overdosed). Common symptoms of an overdose: Your breathing is slower or more shallow than normal. You have a very slow heartbeat (pulse). You have slurred speech. You have nausea and vomiting. Your pupils become very small. You have other potential symptoms: You are very confused. You faint or feel like you will faint. You have  cold, clammy skin. You have blue lips or fingernails. You have thoughts of harming yourself or harming others. These symptoms may represent a serious problem that is an emergency. Do not wait to see if the symptoms will go away. Get medical help right away. Call your local emergency services (911 in the [place] Do not drive yourself to the hospital.  If you ever feel like you may hurt yourself or others, or have thoughts about taking your own life, get help right away. Go to your nearest emergency department or: Call your local emergency services (911 in the [place] Call the [facility] (860-427-2235 in the [place] Call a suicide crisis helpline, such as the National Suicide Prevention Lifeline at 608-823-5315 or 988 in the [place] This is open 24 hours a day in the [place] If you're a Veteran: Call 988 and press 1. This is open 24 hours a day. Text the [employer] at (941)399-8523. Summary Opioid medicines can help you manage moderate to severe pain for a short period of time. A pain treatment plan is an agreement between you and your health care provider. Discuss the goals of your treatment, including how much pain you might expect to have and how you will manage the pain. If you think that you or someone else may have taken too much of an opioid, get medical help right away. This information is not intended to replace advice given to you by your health care provider. Make sure you discuss any questions you have with your health care provider. Document Revised: 08/01/2023 Document Reviewed: 02/04/2021 Elsevier Patient Education  2024 Elsevier Inc.    09/25/2024    2:03 PM  Advanced Directives  Does Patient Have a Medical Advance Directive? No  Would patient like information on creating a medical advance directive? No - Patient declined    Vision: Annual vision screenings are recommended for early detection of glaucoma, cataracts, and diabetic retinopathy. These exams can  also reveal signs of chronic conditions such as diabetes and high blood pressure.  Dental: Annual dental screenings help detect early signs of oral cancer, gum disease, and other conditions linked to overall health, including heart disease and diabetes.  Please see the attached documents for additional preventive care recommendations.

## 2024-09-25 NOTE — Progress Notes (Signed)
 Chief Complaint  Patient presents with   Medicare Wellness     Subjective:   [name] is a 78 y.o. female who presents for a Medicare Annual Wellness Visit.  Allergies (verified) Zanaflex  [tizanidine ] and Adhesive [tape]   History: Past Medical History:  Diagnosis Date   Abrasion of right heel    during admission 10/ 2019 right heel open skin due to movement on sheet   ADD (attention deficit disorder)    Anemia, mild    Anxiety    Arthritis    joints, back   At high risk for falls    10-19-2018 pt has fell twice in past week, tripped   Chronic back pain    all over my back (10/02/2013)   Crushing injury of arm, right 02/06/1989's   it was crushed; wore cast from fingers to top of my shoulder (11/25/2   Crushing injury of left wrist 05/11/1989's   DOE (dyspnea on exertion)    Essential tremor    Fibromyalgia    History of palpitations 2002   recurrent   History of panic attacks    History of pneumonia 08/21/2018   w/ acute respiratory failure/ hypoxia   History of sepsis    admission 08-21-2018  secondary to uropathy obstructive from ureteral stone    Hyperlipidemia    Hypertension    10-19-2018 PER PT AMLODIPINE  ON HOLD SINCE 10/ 2019 DUE TO KIDNEY ISSUES PER DOCTOR   Idiopathic scoliosis 04/19/2013   Left ureteral stone    Migraines    once/month (10/02/2013)   Osteoporosis    Other vitamin B12 deficiency anemia    PONV (postoperative nausea and vomiting)    As a child   Pulmonary nodules/lesions, multiple    Renal insufficiency    S/P gastric bypass 09/16/2003   S/P insertion of spinal cord stimulator    per pt remote is missing   Wears dentures    upper   Wears glasses    Wears glasses    Past Surgical History:  Procedure Laterality Date   ABDOMINAL HYSTERECTOMY  1980's   W/  BSO  AND APPENDECTOMY   CARDIAC CATHETERIZATION  12-13-2002    dr delford   normal coronaries   CYSTOSCOPY WITH URETEROSCOPY AND STENT PLACEMENT Left 09/27/2018    Procedure: LEFT  URETEROSCOPY, HOLMIUM LASER LITHOTRIPSY;  Surgeon: Cam Morene ORN, MD;  Location: [facility];  Service: Urology;  Laterality: Left;   D & C HYSTERSCOPY RESECTION MYOMECTOMY  1980s   HUMERUS FRACTURE SURGERY Left 04/17/2019    Comminuted complex left intra-articular distal humerus fracture/supracondylar humerus fracture displaced   INTRAMEDULLARY (IM) NAIL INTERTROCHANTERIC Right 06/01/2019   Procedure: INTRAMEDULLARY (IM) NAIL INTERTROCHANTRIC;  Surgeon: Melodi Lerner, MD;  Location: [facility];  Service: Orthopedics;  Laterality: Right;   IR NEPHROSTOMY PLACEMENT LEFT  08/22/2018   IR NEPHROSTOMY PLACEMENT LEFT  12/08/2018   KNEE ARTHROSCOPY Right 03-31-2001    dr amy @[facility]    NEPHROLITHOTOMY Left 10/20/2018   Procedure: LEFT NEPHROLITHOTOMY PERCUTANEOUS;  Surgeon: Cam Morene ORN, MD;  Location: [facility];  Service: Urology;  Laterality: Left;   ORIF HUMERUS FRACTURE Left 04/17/2019   Procedure: Open reduction internal fixation left supracondylar humerus fracture with olecranon osteotomy and ulnar nerve release and anterior transposition and repair of structures as necessary;  Surgeon: Camella Fallow, MD;  Location: [facility];  Service: Orthopedics;  Laterality: Left;  3 hrs   REDUCTION MAMMAPLASTY Bilateral 2001   ROBOT ASSISTED LAPAROSCOPIC NEPHRECTOMY Left 12/25/2018  Procedure: XI ROBOTIC ASSISTED LAPAROSCOPIC NEPHRECTOMY, LEFT FLANK EXPLORATION WITH REMOVAL OF BATTERY PACK;  Surgeon: Cam Morene ORN, MD;  Location: [facility];  Service: Urology;  Laterality: Left;   ROUX-EN-Y GASTRIC BYPASS  09-16-2003   dr [email or web address]. [email or web address]  @[email or web address]    via Laparoscopy w/ gastrojejunostomy   SPINAL CORD STIMULATOR IMPLANT  2016   10-19-2018  PER PT HAS REMOTE BUT HAS NOT USED IT SINCE DEC 2018   TONSILLECTOMY AND ADENOIDECTOMY  1955   TUBAL LIGATION  1980's   Family History  Adopted: Yes  Problem Relation Age of Onset   COPD Mother    Emphysema Mother    Heart attack Mother    Other Mother         tobacco abuse   Social History   Occupational History   Occupation: Retired  Tobacco Use   Smoking status: Never   Smokeless tobacco: Never  Vaping Use   Vaping status: Never Used  Substance and Sexual Activity   Alcohol use: No   Drug use: No   Sexual activity: Never    Partners: Male   Tobacco Counseling Counseling given: No  SDOH Screenings   Food Insecurity: No Food Insecurity (09/25/2024)  Housing: Unknown (09/25/2024)  Transportation Needs: No Transportation Needs (09/25/2024)  Utilities: Not At Risk (09/25/2024)  Alcohol Screen: Low Risk  (09/27/2022)  Depression (PHQ2-9): Low Risk  (09/25/2024)  Recent Concern: Depression (PHQ2-9) - High Risk (08/07/2024)  Financial Resource Strain: Low Risk  (03/22/2024)   Received from [facility]  Physical Activity: Insufficiently Active (09/25/2024)  Social Connections: Moderately Isolated (09/25/2024)  Stress: No Stress Concern Present (09/25/2024)  Tobacco Use: Low Risk  (09/25/2024)  Health Literacy: Adequate Health Literacy (09/25/2024)   See flowsheets for full screening details  Depression Screen PHQ 2 & 9 Depression Scale- Over the past 2 weeks, how often have you been bothered by any of the following problems? Little interest or pleasure in doing things: 0 Feeling down, depressed, or hopeless (PHQ Adolescent also includes...irritable): 0 PHQ-2 Total Score: 0 Trouble falling or staying asleep, or sleeping too much: 0 Feeling tired or having little energy: 0 Poor appetite or overeating (PHQ Adolescent also includes...weight loss): 0 Feeling bad about yourself - or that you are a failure or have let yourself or your family down: 0 Trouble concentrating on things, such as reading the newspaper or watching television (PHQ Adolescent also includes...like school work): 0 Moving or speaking so slowly that other people could have noticed. Or the opposite - being so fidgety or restless that you have been moving around  a lot more than usual: 0 PHQ-9 Total Score: 0 If you checked off any problems, how difficult have these problems made it for you to do your work, take care of things at home, or get along with other people?: Not difficult at all  Depression Treatment Depression Interventions/Treatment : EYV7-0 Score <4 Follow-up Not Indicated     Goals Addressed               This Visit's Progress     Remain active (pt-stated)        Stay strong.       Visit info / Clinical Intake: Medicare Wellness Visit Type:: Subsequent Annual Wellness Visit Persons participating in visit:: patient Medicare Wellness Visit Mode:: Telephone If telephone:: video declined Because this visit was a virtual/telehealth visit:: pt reported vitals If Telephone or Video please confirm:: I connected with the patient using audio enabled telemedicine application and verified  that I am speaking with the correct person using two identifiers Patient Location:: Home Provider Location:: Office Information given by:: patient Interpreter Needed?: No Pre-visit prep was completed: no AWV questionnaire completed by patient prior to visit?: no Living arrangements:: lives with spouse/significant other Patient's Overall Health Status Rating: good Typical amount of pain: none Does pain affect daily life?: no Are you currently prescribed opioids?: no  Dietary Habits and Nutritional Risks How many meals a day?: 3 Eats fruit and vegetables daily?: yes Most meals are obtained by: preparing own meals In the last 2 weeks, have you had any of the following?: none Diabetic:: no  Functional Status Activities of Daily Living (to include ambulation/medication): Independent Ambulation: Independent with device- listed below Home Assistive Devices/Equipment: Eyeglasses; Dentures (specify type); Walker (specify Type); Shower/tub chair Medication Administration: Independent Home Management: Independent Manage your own finances?:  yes Primary transportation is: driving Concerns about vision?: no *vision screening is required for [facility]* Concerns about hearing?: (!) yes Uses hearing aids?: (!) yes Hear whispered voice?: yes  Fall Screening Falls in the past year?: 0 Number of falls in past year: 0 Was there an injury with Fall?: 0 Fall Risk Category Calculator: 0 Patient Fall Risk Level: Low Fall Risk  Fall Risk Patient at Risk for Falls Due to: No Fall Risks Fall risk Follow up: Falls evaluation completed  Home and Transportation Safety: All rugs have non-skid backing?: N/A, no rugs All stairs or steps have railings?: yes Grab bars in the bathtub or shower?: (!) no Have non-skid surface in bathtub or shower?: yes Good home lighting?: yes Regular seat belt use?: yes Hospital stays in the last year:: no  Cognitive Assessment Difficulty concentrating, remembering, or making decisions? : no Will 6CIT or Mini Cog be Completed: no 6CIT or Mini Cog Declined: patient alert, oriented, able to answer questions appropriately and recall recent events  Advance Directives (For Healthcare) Does Patient Have a Medical Advance Directive?: No Would patient like information on creating a medical advance directive?: No - Patient declined  Reviewed/Updated  Reviewed/Updated: Reviewed All (Medical, Surgical, Family, Medications, Allergies, Care Teams, Patient Goals)        Objective:    Today's Vitals   09/25/24 1402  Weight: 135 lb (61.2 kg)  Height: 5' 5 (1.651 m)   Body mass index is 22.47 kg/m.  Current Medications (verified) Outpatient Encounter Medications as of 09/25/2024  Medication Sig   amLODipine  (NORVASC ) 5 MG tablet 1 po bid   amphetamine -dextroamphetamine  (ADDERALL  XR) 20 MG 24 hr capsule 1 po bid   Baclofen  5 MG TABS Take 1 tablet by mouth 2 (two) times daily as needed.   brexpiprazole (REXULTI) 1 MG TABS tablet Take 1 tablet (1 mg total) by mouth daily.   clonazePAM  (KLONOPIN ) 0.5 MG tablet  Take 1 tablet (0.5 mg total) by mouth 3 (three) times daily as needed.   loperamide  (IMODIUM ) 2 MG capsule TAKE 1 CAPSULE BY MOUTH DAILY AS NEEDED FOR DIARRHEA OR LOOSE STOOLS   losartan  (COZAAR ) 100 MG tablet Take 1 tablet (100 mg total) by mouth daily.   ondansetron  (ZOFRAN -ODT) 8 MG disintegrating tablet Take 1 tablet (8 mg total) by mouth every 8 (eight) hours as needed for nausea or vomiting.   oxyCODONE  (ROXICODONE ) 15 MG immediate release tablet Take 1 tablet (15 mg total) by mouth 4 (four) times daily.   SUMAtriptan  (IMITREX ) 50 MG tablet TAKE 1 TABLET BY MOUTH AT ONSET OF MIGRAINE; MAY REPEAT 1 TABLET IN 2 HOURS IF NEEDED.  tiZANidine  (ZANAFLEX ) 4 MG tablet Take 4 mg by mouth.   venlafaxine  XR (EFFEXOR -XR) 75 MG 24 hr capsule Take 2 capsules (150 mg total) by mouth daily.   No facility-administered encounter medications on file as of 09/25/2024.   Hearing/Vision screen Hearing Screening - Comments:: Ears hearing Aids Vision Screening - Comments:: Wears rx glasses - up to date with routine eye exams with  Dr Tommas Immunizations and Health Maintenance Health Maintenance  Topic Date Due   Mammogram  04/09/2014   DTaP/Tdap/Td (2 - Tdap) 10/29/2019   COVID-19 Vaccine (3 - Moderna risk series) 10/20/2020   Zoster Vaccines- Shingrix (2 of 2) 01/25/2022   Medicare Annual Wellness (AWV)  09/25/2025   Pneumococcal Vaccine: 50+ Years  Completed   Influenza Vaccine  Completed   DEXA SCAN  Completed   Hepatitis C Screening  Completed   Meningococcal B Vaccine  Aged Out   Colonoscopy  Discontinued        Assessment/Plan:  This is a routine wellness examination for [place].  Patient Care Team: Antonio Meth, Jamee SAUNDERS, DO as PCP - General Maranda Leim DEL, MD as PCP - [facility] (Cardiology)  I have personally reviewed and noted the following in the patient's chart:   Medical and social history Use of alcohol, tobacco or illicit drugs  Current medications and supplements  including opioid prescriptions. Functional ability and status Nutritional status Physical activity Advanced directives List of other physicians Hospitalizations, surgeries, and ER visits in previous 12 months Vitals Screenings to include cognitive, depression, and falls Referrals and appointments  No orders of the defined types were placed in this encounter.  In addition, I have reviewed and discussed with patient certain preventive protocols, quality metrics, and best practice recommendations. A written personalized care plan for preventive services as well as general preventive health recommendations were provided to patient.   Rojelio LELON Blush, LPN   [id number]   Return in 1 year on 10/01/25  After Visit Summary: (MyChart) Due to this being a telephonic visit, the after visit summary with patients personalized plan was offered to patient via MyChart   Nurse Notes: None

## 2024-10-08 DIAGNOSIS — M810 Age-related osteoporosis without current pathological fracture: Secondary | ICD-10-CM | POA: Diagnosis not present

## 2024-10-10 ENCOUNTER — Emergency Department (HOSPITAL_BASED_OUTPATIENT_CLINIC_OR_DEPARTMENT_OTHER)
Admission: EM | Admit: 2024-10-10 | Discharge: 2024-10-10 | Disposition: A | Discharge status: Home / Self Care | Attending: Emergency Medicine | Admitting: Emergency Medicine

## 2024-10-10 ENCOUNTER — Emergency Department (HOSPITAL_BASED_OUTPATIENT_CLINIC_OR_DEPARTMENT_OTHER)

## 2024-10-10 ENCOUNTER — Other Ambulatory Visit: Payer: Self-pay

## 2024-10-10 ENCOUNTER — Encounter (HOSPITAL_BASED_OUTPATIENT_CLINIC_OR_DEPARTMENT_OTHER): Payer: Self-pay

## 2024-10-10 DIAGNOSIS — I10 Essential (primary) hypertension: Secondary | ICD-10-CM | POA: Diagnosis not present

## 2024-10-10 DIAGNOSIS — A084 Viral intestinal infection, unspecified: Secondary | ICD-10-CM | POA: Insufficient documentation

## 2024-10-10 DIAGNOSIS — R109 Unspecified abdominal pain: Secondary | ICD-10-CM | POA: Diagnosis present

## 2024-10-10 DIAGNOSIS — Z79899 Other long term (current) drug therapy: Secondary | ICD-10-CM | POA: Insufficient documentation

## 2024-10-10 DIAGNOSIS — R1032 Left lower quadrant pain: Secondary | ICD-10-CM | POA: Diagnosis not present

## 2024-10-10 DIAGNOSIS — Z905 Acquired absence of kidney: Secondary | ICD-10-CM | POA: Diagnosis not present

## 2024-10-10 LAB — URINALYSIS, MICROSCOPIC (REFLEX)

## 2024-10-10 LAB — COMPREHENSIVE METABOLIC PANEL WITH GFR
ALT: <5 U/L (ref 0–44)
AST: 28 U/L (ref 15–41)
Albumin: 4.5 g/dL (ref 3.5–5.0)
Alkaline Phosphatase: 183 U/L — ABNORMAL HIGH (ref 38–126)
Anion gap: 17 — ABNORMAL HIGH (ref 5–15)
BUN: 12 mg/dL (ref 8–23)
CO2: 22 mmol/L (ref 22–32)
Calcium: 9.7 mg/dL (ref 8.9–10.3)
Chloride: 96 mmol/L — ABNORMAL LOW (ref 98–111)
Creatinine, Ser: 1.11 mg/dL — ABNORMAL HIGH (ref 0.44–1.00)
GFR, Estimated: 51 mL/min — ABNORMAL LOW (ref >60)
Glucose, Bld: 125 mg/dL — ABNORMAL HIGH (ref 70–99)
Potassium: 3.8 mmol/L (ref 3.5–5.1)
Sodium: 136 mmol/L (ref 135–145)
Total Bilirubin: 0.6 mg/dL (ref 0.0–1.2)
Total Protein: 8.3 g/dL — ABNORMAL HIGH (ref 6.5–8.1)

## 2024-10-10 LAB — URINALYSIS, ROUTINE W REFLEX MICROSCOPIC
Bilirubin Urine: NEGATIVE
Glucose, UA: NEGATIVE mg/dL
Ketones, ur: NEGATIVE mg/dL
Leukocytes,Ua: NEGATIVE
Nitrite: NEGATIVE
Protein, ur: >=300 mg/dL — AB
Specific Gravity, Urine: 1.02 (ref 1.005–1.030)
pH: 7 (ref 5.0–8.0)

## 2024-10-10 LAB — CBC
HCT: 43.9 % (ref 36.0–46.0)
Hemoglobin: 14.5 g/dL (ref 12.0–15.0)
MCH: 33.3 pg (ref 26.0–34.0)
MCHC: 33 g/dL (ref 30.0–36.0)
MCV: 100.9 fL — ABNORMAL HIGH (ref 80.0–100.0)
Platelets: 230 K/uL (ref 150–400)
RBC: 4.35 MIL/uL (ref 3.87–5.11)
RDW: 15.9 % — ABNORMAL HIGH (ref 11.5–15.5)
WBC: 7.2 K/uL (ref 4.0–10.5)
nRBC: 0 % (ref 0.0–0.2)

## 2024-10-10 LAB — LIPASE, BLOOD: Lipase: 49 U/L (ref 11–51)

## 2024-10-10 LAB — RESP PANEL BY RT-PCR (RSV, FLU A&B, COVID)  RVPGX2
Influenza A by PCR: NEGATIVE
Influenza B by PCR: NEGATIVE
Resp Syncytial Virus by PCR: NEGATIVE
SARS Coronavirus 2 by RT PCR: NEGATIVE

## 2024-10-10 LAB — LACTIC ACID, PLASMA
Lactic Acid, Venous: 3.1 mmol/L (ref 0.5–1.9)
Lactic Acid, Venous: 1.7 mmol/L (ref 0.5–1.9)

## 2024-10-10 MED ORDER — ONDANSETRON HCL 4 MG PO TABS: 4.0000 mg | ORAL_TABLET | Freq: Three times a day (TID) | ORAL | 0 refills | Status: DC | PRN

## 2024-10-10 MED ORDER — AMLODIPINE BESYLATE 5 MG PO TABS
5.0000 mg | ORAL_TABLET | Freq: Once | ORAL | Status: AC
  Administered 2024-10-10: 5 mg via ORAL
  Filled 2024-10-10: qty 1

## 2024-10-10 MED ORDER — ONDANSETRON HCL 4 MG/2ML IJ SOLN
4.0000 mg | Freq: Once | INTRAMUSCULAR | Status: AC
  Administered 2024-10-10: 4 mg via INTRAVENOUS
  Filled 2024-10-10: qty 2

## 2024-10-10 MED ORDER — SODIUM CHLORIDE 0.9 % IV BOLUS
1000.0000 mL | Freq: Once | INTRAVENOUS | Status: AC
  Administered 2024-10-10: 1000 mL via INTRAVENOUS

## 2024-10-10 MED ORDER — IOHEXOL 300 MG/ML  SOLN
100.0000 mL | Freq: Once | INTRAMUSCULAR | Status: AC | PRN
  Administered 2024-10-10: 100 mL via INTRAVENOUS

## 2024-10-10 MED ORDER — MORPHINE SULFATE (PF) 4 MG/ML IV SOLN
4.0000 mg | Freq: Once | INTRAVENOUS | Status: AC
  Administered 2024-10-10: 4 mg via INTRAVENOUS
  Filled 2024-10-10: qty 1

## 2024-10-10 NOTE — ED Provider Notes (Signed)
 [facility] Provider Note   CSN: [id number] Arrival date & time: 10/10/24  9281     Patient presents with: Abdominal Pain   [name] or [name] is a 78 y.o. female.  With a history of essential tremor, renal insufficiency, hypertension who presents to the ED for GI illness.  Symptoms of nausea vomiting diarrhea abdominal pain started yesterday.  Along with associated weakness dehydration.  At least 5 episodes of nonbloody nonbilious emesis and 2-3 episodes of diarrhea at home.  No known sick contacts.  Denies fevers chills chest pain shortness of breath and urinary symptoms    Abdominal Pain      Prior to Admission medications   Medication Sig Start Date End Date Taking? Authorizing Provider  ondansetron  (ZOFRAN ) 4 MG tablet Take 1 tablet (4 mg total) by mouth every 8 (eight) hours as needed for up to 5 days for nausea or vomiting. 10/10/24 10/15/24 Yes Pamella Ozell LABOR, DO  amLODipine  (NORVASC ) 5 MG tablet 1 po bid 09/03/24   Antonio Meth, [name], DO  amphetamine -dextroamphetamine  (ADDERALL  XR) 20 MG 24 hr capsule 1 po bid 06/21/24   Antonio Meth, [name], DO  Baclofen  5 MG TABS Take 1 tablet by mouth 2 (two) times daily as needed. 05/14/23   [provider]  brexpiprazole  (REXULTI ) 1 MG TABS tablet Take 1 tablet (1 mg total) by mouth daily. 08/07/24   Lowne Chase, [name], DO  clonazePAM  (KLONOPIN ) 0.5 MG tablet Take 1 tablet (0.5 mg total) by mouth 3 (three) times daily as needed. 08/29/24   Antonio Meth Rockers R, DO  loperamide  (IMODIUM ) 2 MG capsule TAKE 1 CAPSULE BY MOUTH DAILY AS NEEDED FOR DIARRHEA OR LOOSE STOOLS 07/27/24   Antonio Meth, Rockers SAUNDERS, DO  losartan  (COZAAR ) 100 MG tablet Take 1 tablet (100 mg total) by mouth daily. 09/03/24   Antonio Meth Rockers SAUNDERS, DO  ondansetron  (ZOFRAN -ODT) 8 MG disintegrating tablet Take 1 tablet (8 mg total) by mouth every 8 (eight) hours as needed for nausea or vomiting. 05/10/24   Antonio Meth Rockers SAUNDERS, DO  oxyCODONE  (ROXICODONE ) 15 MG immediate release tablet Take 1 tablet (15 mg total) by mouth 4 (four) times daily. 04/19/19   Camella Fallow, MD  SUMAtriptan  (IMITREX ) 50 MG tablet TAKE 1 TABLET BY MOUTH AT ONSET OF MIGRAINE; MAY REPEAT 1 TABLET IN 2 HOURS IF NEEDED. 07/27/24   Antonio Meth, Rockers SAUNDERS, DO  tiZANidine  (ZANAFLEX ) 4 MG tablet Take 4 mg by mouth. 07/05/24   [provider]  venlafaxine  XR (EFFEXOR -XR) 75 MG 24 hr capsule Take 2 capsules (150 mg total) by mouth daily. 08/20/24   Antonio Meth Rockers SAUNDERS, DO    Allergies: Zanaflex  [tizanidine ] and Adhesive [tape]    Review of Systems  Gastrointestinal:  Positive for abdominal pain.    Updated Vital Signs BP (!) 178/122   Pulse (!) 117   Temp 98.1 F (36.7 C) (Oral)   Resp 19   SpO2 97%   Physical Exam Vitals and nursing note reviewed.  Constitutional:      Comments: Generalized resting tremor  HENT:     Head: Normocephalic and atraumatic.  Eyes:     Pupils: Pupils are equal, round, and reactive to light.  Cardiovascular:     Rate and Rhythm: Normal rate and regular rhythm.  Pulmonary:     Effort: Pulmonary effort is normal.     Breath sounds: Normal breath sounds.  Abdominal:  Palpations: Abdomen is soft.     Tenderness: There is no abdominal tenderness.  Skin:    General: Skin is warm and dry.  Neurological:     General: No focal deficit present.     Mental Status: She is alert.     Motor: No weakness.  Psychiatric:        Mood and Affect: Mood normal.     (all labs ordered are listed, but only abnormal results are displayed) Labs Reviewed  COMPREHENSIVE METABOLIC PANEL WITH GFR - Abnormal; Notable for the following components:      Result Value   Chloride 96 (*)    Glucose, Bld 125 (*)    Creatinine, Ser 1.11 (*)    Total Protein 8.3 (*)    Alkaline Phosphatase 183 (*)    GFR, Estimated 51 (*)    Anion gap 17 (*)    All other components within normal limits  CBC -  Abnormal; Notable for the following components:   MCV 100.9 (*)    RDW 15.9 (*)    All other components within normal limits  URINALYSIS, ROUTINE W REFLEX MICROSCOPIC - Abnormal; Notable for the following components:   Hgb urine dipstick TRACE (*)    Protein, ur >=300 (*)    All other components within normal limits  LACTIC ACID, PLASMA - Abnormal; Notable for the following components:   Lactic Acid, Venous 3.1 (*)    All other components within normal limits  URINALYSIS, MICROSCOPIC (REFLEX) - Abnormal; Notable for the following components:   Bacteria, UA MANY (*)    All other components within normal limits  RESP PANEL BY RT-PCR (RSV, FLU A&B, COVID)  RVPGX2  LIPASE, BLOOD  LACTIC ACID, PLASMA    EKG: EKG Interpretation Date/Time:  Wednesday October 10 2024 07:39:56 EST Ventricular Rate:  76 PR Interval:    QRS Duration:  81 QT Interval:  389 QTC Calculation: 438 R Axis:   -39  Text Interpretation: Normal sinus rhythm Poor data quality Confirmed by Pamella Sharper [phone]) on 10/10/2024 11:30:25 AM  Radiology: CT ABDOMEN PELVIS W CONTRAST Result Date: 10/10/2024 EXAM: CT ABDOMEN AND PELVIS WITH CONTRAST 10/10/2024 09:21:07 AM TECHNIQUE: CT of the abdomen and pelvis was performed with the administration of 100 mL of iohexol  (OMNIPAQUE ) 300 MG/ML solution. Multiplanar reformatted images are provided for review. Automated exposure control, iterative reconstruction, and/or weight-based adjustment of the mA/kV was utilized to reduce the radiation dose to as low as reasonably achievable. COMPARISON: CT 06/05/2024, 03/11/2023 and 03/06/2021. CLINICAL HISTORY: LLQ abdominal pain; abd pain n/v/d FINDINGS: LOWER CHEST: Minimal calcified plaque of the descending thoracic aorta. Visualized lung bases demonstrate stable bibasilar scarring with mild bronchiectatic change over the right middle lobe. There is minimal scarring over the inferior aspect of the right upper lobe at the junction of the minor  fissure with 7 mm nodular component which is indeterminate. LIVER: The liver is unremarkable. GALLBLADDER AND BILE DUCTS: Gallbladder is unremarkable. No biliary ductal dilatation. SPLEEN: No acute abnormality. PANCREAS: No acute abnormality. ADRENAL GLANDS: No acute abnormality. KIDNEYS, URETERS AND BLADDER: Evidence of previous left nephrectomy. Right kidney is normal in size without hydronephrosis or nephrolithiasis. Left renal fossa is unremarkable. No stones in the kidneys or ureters. No hydronephrosis. No perinephric or periureteral stranding. Urinary bladder is unremarkable. GI AND BOWEL: Suture sutures over the stomach likely representing previous bypass surgery. Small bowel is unremarkable. Appendix not visualized. Surgical switch line over a bowel loop in the left mid abdomen. Mild submucosal  edema of the sigmoid colon which may be due to an acute colitis. There is no bowel obstruction. PERITONEUM AND RETROPERITONEUM: No ascites. No free air. VASCULATURE: Mucosal plaque of the abdominal aorta which is normal in caliber. Remaining vascular structures are unremarkable. LYMPH NODES: No lymphadenopathy. REPRODUCTIVE ORGANS: No acute abnormality. BONES AND SOFT TISSUES: Stable severe compression fractures of T12 through L4 with previous kyphoplasty of L1. No acute osseous abnormality. No focal soft tissue abnormality. IMPRESSION: 1. Mild submucosal edema of the sigmoid colon, possibly representing acute colitis. 2. Indeterminate 7 mm nodule right upper lobe at the junction of the right minor fissure with adjacent scarring; recommend non-contrast chest CT at 612 months, then consider 1824 months, per Fleischner Society Guidelines. 3. Stable severe compression fractures of T12 through L4 with prior L1 kyphoplasty. 4. Multiple postsurgical changes as described, including left nephrectomy and gastric bypass . Electronically signed by: Toribio Agreste MD 10/10/2024 09:57 AM EST RP Workstation: [id number]      Procedures   Medications Ordered in the ED  sodium chloride  0.9 % bolus 1,000 mL (0 mLs Intravenous Stopped 10/10/24 0934)  ondansetron  (ZOFRAN ) injection 4 mg (4 mg Intravenous Given 10/10/24 0804)  amLODipine  (NORVASC ) tablet 5 mg (5 mg Oral Given 10/10/24 0822)  iohexol  (OMNIPAQUE ) 300 MG/ML solution 100 mL (100 mLs Intravenous Contrast Given 10/10/24 0907)  morphine  (PF) 4 MG/ML injection 4 mg (4 mg Intravenous Given 10/10/24 0932)  ondansetron  (ZOFRAN ) injection 4 mg (4 mg Intravenous Given 10/10/24 0932)    Clinical Course as of 10/10/24 1231  Wed Oct 10, 2024  1228 Laboratory workup unremarkable with downtrending venous lactic no white count alk phos at baseline.  CT abdomen pelvis shows likely colitis consistent with viral GI illness.  Informed herof pulmonary nodule seen on CT.  UA negative.  Patient still having some mild nausea but able to tolerate p.o.  Blood pressure has improved.  Will discharge with p.o. Zofran  and instruction for symptomatic management of likely viral GI illness at home.  Patient and daughter are in agreement with this plan.  Return precaution discussed in detail [MP]    Clinical Course User Index [MP] Pamella Ozell LABOR, DO                                 Medical Decision Making 78 year old female with history as above presents to the ED for nausea vomiting diarrhea that started yesterday.  No fevers chills.  Afebrile.  She is hypertensive but did not yet take her amlodipine  yet this morning.  Denies chest pain shortness of breath.  No significant abdominal tenderness on my exam.  Suspect early viral GI illness.  Will obtain laboratory workup including metabolic panel CBC lipase and give IV fluids Zofran  for symptomatic relief.  Will reevaluate after treatment and labs have resulted.  Would consider abdominal CT to look for intra-abdominal infection should there be significant laboratory abnormalities or worsening clinical condition.  Amount and/or Complexity  of Data Reviewed Labs: ordered. Radiology: ordered.  Risk Prescription drug management.        Final diagnoses:  Viral gastroenteritis    ED Discharge Orders          Ordered    ondansetron  (ZOFRAN ) 4 MG tablet  Every 8 hours PRN        10/10/24 1231               Pamella Ozell LABOR, DO 10/10/24 1231

## 2024-10-10 NOTE — ED Notes (Signed)
 Attempted to call family 2x on the cell. They stepped out to get food, will call again. Expecting them to be back in .

## 2024-10-10 NOTE — ED Notes (Signed)
 In and Out cath done by Gabi and Maire NT+3 to obtain UA

## 2024-10-10 NOTE — ED Notes (Signed)
 Pt has returned to their room from CT

## 2024-10-10 NOTE — ED Notes (Signed)
 Family has returned. Techs to get her dressed and will discharge.

## 2024-10-10 NOTE — ED Triage Notes (Signed)
 Family reports generalized abd pain, N/V/D, weakness

## 2024-10-10 NOTE — ED Notes (Signed)
 Discharge paperwork reviewed entirely with patient, including follow up care. Pain was under control. The patient received instruction and coaching on their prescriptions, and all follow-up questions were answered.  Pt verbalized understanding as well as all parties involved. No questions or concerns voiced at the time of discharge. No acute distress noted. Pt was encouraged to stay adequately hydrated and eat a healthy diet.   Pt was wheeled out to the PVA in a wheelchair without incident.  Pt advised they will notify their PCP immediately.  The pt was instructed to set up and/or review MyChart for their results; and was informed their Providers all have access to the information as well.

## 2024-10-10 NOTE — Discharge Instructions (Addendum)
 You were seen in the emergency room for abdominal pain nausea and vomiting Your blood work looked okay The CAT scan showed inflammation of the colon consistent with viral illness It also showed a pulmonary nodule you need to follow-up with your doctor with We have called in a prescription for Zofran  for you to pick up from your pharmacy and begin taking as directed for nausea and vomiting at home Return to the Emergency Department for severe pain if you are unable to eat or drink or have any other concerns

## 2024-10-14 ENCOUNTER — Other Ambulatory Visit: Payer: Self-pay | Admitting: Family Medicine

## 2024-10-14 DIAGNOSIS — I10 Essential (primary) hypertension: Secondary | ICD-10-CM

## 2024-10-15 ENCOUNTER — Observation Stay (HOSPITAL_COMMUNITY)
Admission: EM | Admit: 2024-10-15 | Discharge: 2024-10-18 | Disposition: A | Source: Ambulatory Visit | Attending: Internal Medicine | Admitting: Internal Medicine

## 2024-10-15 ENCOUNTER — Observation Stay (HOSPITAL_COMMUNITY)

## 2024-10-15 ENCOUNTER — Other Ambulatory Visit: Payer: Self-pay

## 2024-10-15 ENCOUNTER — Ambulatory Visit: Payer: Self-pay

## 2024-10-15 ENCOUNTER — Emergency Department (HOSPITAL_COMMUNITY)

## 2024-10-15 DIAGNOSIS — I10 Essential (primary) hypertension: Secondary | ICD-10-CM | POA: Diagnosis not present

## 2024-10-15 DIAGNOSIS — E860 Dehydration: Principal | ICD-10-CM

## 2024-10-15 DIAGNOSIS — R9389 Abnormal findings on diagnostic imaging of other specified body structures: Secondary | ICD-10-CM | POA: Diagnosis not present

## 2024-10-15 DIAGNOSIS — R000 Tachycardia, unspecified: Secondary | ICD-10-CM | POA: Diagnosis present

## 2024-10-15 DIAGNOSIS — E43 Unspecified severe protein-calorie malnutrition: Secondary | ICD-10-CM

## 2024-10-15 DIAGNOSIS — I824Y3 Acute embolism and thrombosis of unspecified deep veins of proximal lower extremity, bilateral: Secondary | ICD-10-CM | POA: Diagnosis present

## 2024-10-15 DIAGNOSIS — N189 Chronic kidney disease, unspecified: Secondary | ICD-10-CM

## 2024-10-15 DIAGNOSIS — Z79899 Other long term (current) drug therapy: Secondary | ICD-10-CM | POA: Diagnosis not present

## 2024-10-15 DIAGNOSIS — R109 Unspecified abdominal pain: Secondary | ICD-10-CM | POA: Diagnosis not present

## 2024-10-15 DIAGNOSIS — I700 Atherosclerosis of aorta: Secondary | ICD-10-CM | POA: Diagnosis not present

## 2024-10-15 DIAGNOSIS — G894 Chronic pain syndrome: Secondary | ICD-10-CM | POA: Diagnosis not present

## 2024-10-15 DIAGNOSIS — R748 Abnormal levels of other serum enzymes: Secondary | ICD-10-CM | POA: Diagnosis not present

## 2024-10-15 DIAGNOSIS — N179 Acute kidney failure, unspecified: Secondary | ICD-10-CM | POA: Diagnosis not present

## 2024-10-15 DIAGNOSIS — Z905 Acquired absence of kidney: Secondary | ICD-10-CM | POA: Diagnosis not present

## 2024-10-15 DIAGNOSIS — Z9884 Bariatric surgery status: Secondary | ICD-10-CM | POA: Diagnosis not present

## 2024-10-15 DIAGNOSIS — R627 Adult failure to thrive: Secondary | ICD-10-CM | POA: Diagnosis not present

## 2024-10-15 DIAGNOSIS — N390 Urinary tract infection, site not specified: Secondary | ICD-10-CM | POA: Diagnosis not present

## 2024-10-15 DIAGNOSIS — R531 Weakness: Secondary | ICD-10-CM | POA: Diagnosis present

## 2024-10-15 DIAGNOSIS — I129 Hypertensive chronic kidney disease with stage 1 through stage 4 chronic kidney disease, or unspecified chronic kidney disease: Secondary | ICD-10-CM | POA: Diagnosis not present

## 2024-10-15 LAB — CBC
HCT: 45.2 % (ref 36.0–46.0)
Hemoglobin: 14.9 g/dL (ref 12.0–15.0)
MCH: 34.4 pg — ABNORMAL HIGH (ref 26.0–34.0)
MCHC: 33 g/dL (ref 30.0–36.0)
MCV: 104.4 fL — ABNORMAL HIGH (ref 80.0–100.0)
Platelets: 221 K/uL (ref 150–400)
RBC: 4.33 MIL/uL (ref 3.87–5.11)
RDW: 16.5 % — ABNORMAL HIGH (ref 11.5–15.5)
WBC: 8.6 K/uL (ref 4.0–10.5)
nRBC: 0 % (ref 0.0–0.2)

## 2024-10-15 LAB — COMPREHENSIVE METABOLIC PANEL WITH GFR
ALT: <5 U/L (ref 0–44)
AST: 18 U/L (ref 15–41)
Albumin: 4.1 g/dL (ref 3.5–5.0)
Alkaline Phosphatase: 161 U/L — ABNORMAL HIGH (ref 38–126)
Anion gap: 11 (ref 5–15)
BUN: 27 mg/dL — ABNORMAL HIGH (ref 8–23)
CO2: 22 mmol/L (ref 22–32)
Calcium: 9.2 mg/dL (ref 8.9–10.3)
Chloride: 105 mmol/L (ref 98–111)
Creatinine, Ser: 1.22 mg/dL — ABNORMAL HIGH (ref 0.44–1.00)
GFR, Estimated: 46 mL/min — ABNORMAL LOW (ref >60)
Glucose, Bld: 88 mg/dL (ref 70–99)
Potassium: 4.7 mmol/L (ref 3.5–5.1)
Sodium: 138 mmol/L (ref 135–145)
Total Bilirubin: 0.8 mg/dL (ref 0.0–1.2)
Total Protein: 7.9 g/dL (ref 6.5–8.1)

## 2024-10-15 LAB — LIPASE, BLOOD: Lipase: 70 U/L — ABNORMAL HIGH (ref 11–51)

## 2024-10-15 LAB — I-STAT CG4 LACTIC ACID, ED: Lactic Acid, Venous: 0.9 mmol/L (ref 0.5–1.9)

## 2024-10-15 MED ORDER — ACETAMINOPHEN 500 MG PO TABS
500.0000 mg | ORAL_TABLET | Freq: Four times a day (QID) | ORAL | Status: DC | PRN
  Administered 2024-10-17 – 2024-10-18 (×2): 500 mg via ORAL
  Filled 2024-10-15 (×2): qty 1

## 2024-10-15 MED ORDER — LACTATED RINGERS IV BOLUS
1000.0000 mL | Freq: Once | INTRAVENOUS | Status: AC
  Administered 2024-10-15: 1000 mL via INTRAVENOUS

## 2024-10-15 MED ORDER — SODIUM CHLORIDE 0.9 % IV BOLUS
1000.0000 mL | Freq: Once | INTRAVENOUS | Status: AC
  Administered 2024-10-15: 1000 mL via INTRAVENOUS

## 2024-10-15 MED ORDER — HEPARIN SODIUM (PORCINE) 5000 UNIT/ML IJ SOLN
5000.0000 [IU] | Freq: Three times a day (TID) | INTRAMUSCULAR | Status: DC
  Administered 2024-10-15 – 2024-10-18 (×8): 5000 [IU] via SUBCUTANEOUS
  Filled 2024-10-15 (×8): qty 1

## 2024-10-15 MED ORDER — ACETAMINOPHEN 650 MG RE SUPP: 650.0000 mg | Freq: Four times a day (QID) | RECTAL | Status: DC | PRN

## 2024-10-15 MED ORDER — ONDANSETRON HCL 4 MG PO TABS: 4.0000 mg | ORAL_TABLET | Freq: Four times a day (QID) | ORAL | Status: DC | PRN

## 2024-10-15 MED ORDER — IOHEXOL 300 MG/ML  SOLN
80.0000 mL | Freq: Once | INTRAMUSCULAR | Status: AC | PRN
  Administered 2024-10-15: 80 mL via INTRAVENOUS

## 2024-10-15 MED ORDER — LACTATED RINGERS IV SOLN: INTRAVENOUS | Status: DC

## 2024-10-15 MED ORDER — ONDANSETRON HCL 4 MG/2ML IJ SOLN: 4.0000 mg | Freq: Four times a day (QID) | INTRAMUSCULAR | Status: DC | PRN

## 2024-10-15 MED ORDER — MORPHINE SULFATE (PF) 4 MG/ML IV SOLN
4.0000 mg | Freq: Once | INTRAVENOUS | Status: AC
  Administered 2024-10-15: 4 mg via INTRAVENOUS
  Filled 2024-10-15: qty 1

## 2024-10-15 MED ORDER — ONDANSETRON HCL 4 MG/2ML IJ SOLN
4.0000 mg | Freq: Once | INTRAMUSCULAR | Status: AC
  Administered 2024-10-15: 4 mg via INTRAVENOUS
  Filled 2024-10-15: qty 2

## 2024-10-15 MED ORDER — ACETAMINOPHEN 325 MG PO TABS
650.0000 mg | ORAL_TABLET | Freq: Once | ORAL | Status: AC
  Administered 2024-10-15: 650 mg via ORAL
  Filled 2024-10-15: qty 2

## 2024-10-15 NOTE — ED Notes (Signed)
 Pt fingertips noted to be cyanotic. Asked if she had a hx of Reynauds, and she was unsure. Stated her fingertips are sometimes cyanotic and she c/o being cold, although she was warm to touch.

## 2024-10-15 NOTE — Telephone Encounter (Signed)
 Note: please add left nephrectomy in 2017 to PMH  Pt self-scheduled PCP hosp f/u prior to call. Refused earlier appt with any provider other than PCP. This RN provided rationale for being seen sooner, pt verbalized understanding but still prefers PCP. Added to waitlist.  Requesting PCP review labs and imaging/CT and provide recommendations.   FYI Only or Action Required?: Action required by provider: clinical question for provider and update on patient condition.  Patient was last seen in primary care on 09/03/2024 by Antonio Meth, Jamee SAUNDERS, DO.  Called Nurse Triage reporting Abdominal Pain.  Symptoms began about a month ago.  Interventions attempted: Prescription medications: oxycodone , baclofen , zofran  tylenol .  Symptoms are: unchanged.  Triage Disposition: See HCP Within 4 Hours (Or PCP Triage)  Patient/caregiver understands and will follow disposition?: No, wishes to speak with PCP Reason for Disposition  [1] MILD-MODERATE pain AND [2] constant AND [3] age > 60 years  Answer Assessment - Initial Assessment Questions This RN contacted pt for triage. Pt gave permission for me to speak with patient's husband Norleen.   Pt was seen in the ED on 12/3 for abdominal pain, weakness, vomiting and diarrhea. Discharged on zofran  4 mg q 8 hrs PRN. Pt states ED told her it was viral.  Husband states vomiting and diarrhea has resolved but pt continues to have abdominal pain and weakness.   Pt came on phone and states abdominal pain is mid/upper and mid/lower.  Pt is taking tylenol , muscle relaxer and oxycodone . Last BM was 10/14/24 and described as normal in appearance. Denies hematochezia or melena appearance.   Advised to return to the ED with worsening pain, vomiting, dark/black stools, tarry stools, blood in the stool, or stool that has a coffee grind appearance.   1. LOCATION: Where does it hurt?      Mid/upper mid/lower  3. ONSET: When did the pain begin? (e.g., minutes, hours  or days ago)      Gradual x 1 month  4. SUDDEN: Gradual or sudden onset?     Gradual  5. PATTERN Does the pain come and go, or is it constant?     Constant  6. SEVERITY: How bad is the pain?  (e.g., Scale 1-10; mild, moderate, or severe)     6/10  7. OTHER SYMPTOMS: Do you have any other symptoms? (e.g., back pain, diarrhea, fever, urination pain, vomiting)       Denies diarrhea, unknown temp, denies vomiting, denies dysuria  Protocols used: Abdominal Pain - [facility]  Copied from CRM [id number]. Topic: Clinical - Red Word Triage >> Oct 15, 2024  9:28 AM Antony RAMAN wrote: Red Word that prompted transfer to Nurse Triage: in pain since er visit, doesn't want to keep taking painkillers. No strength  Past Medical History:  Diagnosis Date   Abrasion of right heel    during admission 10/ 2019 right heel open skin due to movement on sheet   ADD (attention deficit disorder)    Anemia, mild    Anxiety    Arthritis    joints, back   At high risk for falls    10-19-2018 pt has fell twice in past week, tripped   Chronic back pain    all over my back (10/02/2013)   Crushing injury of arm, right 02/06/1989's   it was crushed; wore cast from fingers to top of my shoulder (11/25/2   Crushing injury of left wrist 05/11/1989's   DOE (dyspnea on exertion)    Essential tremor  Fibromyalgia    History of palpitations 2002   recurrent   History of panic attacks    History of pneumonia 08/21/2018   w/ acute respiratory failure/ hypoxia   History of sepsis    admission 08-21-2018  secondary to uropathy obstructive from ureteral stone    Hyperlipidemia    Hypertension    10-19-2018 PER PT AMLODIPINE  ON HOLD SINCE 10/ 2019 DUE TO KIDNEY ISSUES PER DOCTOR   Idiopathic scoliosis 04/19/2013   Left ureteral stone    Migraines    once/month (10/02/2013)   Osteoporosis    Other vitamin B12 deficiency anemia    PONV (postoperative nausea and vomiting)    As a child   Pulmonary  nodules/lesions, multiple    Renal insufficiency    S/P gastric bypass 09/16/2003   S/P insertion of spinal cord stimulator    per pt remote is missing   Wears dentures    upper   Wears glasses    Wears glasses   -pt had left nephrectomy

## 2024-10-15 NOTE — ED Notes (Signed)
 Unsuccessful IV attempt x2.

## 2024-10-15 NOTE — ED Triage Notes (Signed)
 Pt arrives to triage via wheelchair with complaints of weakness following a recent viral illness. Vomiting and diarrhea has resolved., but pt has complaints of extreme weakness and feeling like she can't do anything.

## 2024-10-15 NOTE — Telephone Encounter (Signed)
 Pt seen on 12/3 at the ED. Please advise

## 2024-10-15 NOTE — H&P (Signed)
 History and Physical    Patient: [name] FMW:[id number] DOB: 1946/10/26 DOA: 10/15/2024 DOS: the patient was seen and examined on 10/15/2024 PCP: Antonio Cyndee Jamee JONELLE, DO  Patient coming from: Home  Chief Complaint:  Chief Complaint  Patient presents with   Weakness   Abdominal Pain   HPI: [name] is a 78 y.o. female with medical history significant for hypertension, tremor, depression, one kidney, CKD, and severe malnutrition.  She has history of gastric bypass in 2004 and spinal cord stimulator in 2016.  She has baseline significant rib to pelvis deformity and is forward flexed.  She follows with [facility] for this. The patient reports that she received a flu shot 2 months ago.  Her first 1 in 6 years.  Since that time she has been struggling with recurrent viruses.  As soon as she recovers from one she seems to get another.  Her last virus involved abdominal pain and vomiting.  She was seen in the emergency department for this 5 days ago.  She says she has never felt this sick in her entire life. Luckily the vomiting stopped 3 days ago but she does not feel any better.  She is so weak she cannot stand up without assistance.  She has no energy.  She just feels terrible.  No fevers. No diarrhea (she did have diarrhea since a couple of weeks ago with one of the previous viruses). Her voice has been weak.  She had some soreness of her throat and has been taking little bird bites to get her food down.  She has just had a very rough couple of days and overall does not feel like she is getting better.  That is why she presented to the emergency department today.  Her workup in the emergency department included a CT scan of the abdomen and pelvis which was fairly unremarkable.  Clinically she looked quite dehydrated.  She was given a total of 2 L of IV fluids in the emergency department but she remained tachycardic.  At that juncture the hospitalist were called  to admit the patient. The patient does have chronic CKD but her BUN is slightly elevated at 27.  Her white blood cell count is normal at 8.6.  Her lactic acid level is normal at 0.9.  Review of Systems: As mentioned in the history of present illness. All other systems reviewed and are negative. Past Medical History:  Diagnosis Date   Abrasion of right heel    during admission 10/ 2019 right heel open skin due to movement on sheet   ADD (attention deficit disorder)    Anemia, mild    Anxiety    Arthritis    joints, back   At high risk for falls    10-19-2018 pt has fell twice in past week, tripped   Chronic back pain    all over my back (10/02/2013)   Crushing injury of arm, right 02/06/1989's   it was crushed; wore cast from fingers to top of my shoulder (11/25/2   Crushing injury of left wrist 05/11/1989's   DOE (dyspnea on exertion)    Essential tremor    Fibromyalgia    History of palpitations 2002   recurrent   History of panic attacks    History of pneumonia 08/21/2018   w/ acute respiratory failure/ hypoxia   History of sepsis    admission 08-21-2018  secondary to uropathy obstructive from ureteral stone    Hyperlipidemia  Hypertension    10-19-2018 PER PT AMLODIPINE  ON HOLD SINCE 10/ 2019 DUE TO KIDNEY ISSUES PER DOCTOR   Idiopathic scoliosis 04/19/2013   Left ureteral stone    Migraines    once/month (10/02/2013)   Osteoporosis    Other vitamin B12 deficiency anemia    PONV (postoperative nausea and vomiting)    As a child   Pulmonary nodules/lesions, multiple    Renal insufficiency    S/P gastric bypass 09/16/2003   S/P insertion of spinal cord stimulator    per pt remote is missing   Wears dentures    upper   Wears glasses    Wears glasses    Past Surgical History:  Procedure Laterality Date   ABDOMINAL HYSTERECTOMY  1980's   W/  BSO  AND APPENDECTOMY   CARDIAC CATHETERIZATION  12-13-2002    dr delford   normal coronaries   CYSTOSCOPY WITH  URETEROSCOPY AND STENT PLACEMENT Left 09/27/2018   Procedure: LEFT  URETEROSCOPY, HOLMIUM LASER LITHOTRIPSY;  Surgeon: Cam Morene ORN, MD;  Location: [facility];  Service: Urology;  Laterality: Left;   D & C HYSTERSCOPY RESECTION MYOMECTOMY  1980s   HUMERUS FRACTURE SURGERY Left 04/17/2019    Comminuted complex left intra-articular distal humerus fracture/supracondylar humerus fracture displaced   INTRAMEDULLARY (IM) NAIL INTERTROCHANTERIC Right 06/01/2019   Procedure: INTRAMEDULLARY (IM) NAIL INTERTROCHANTRIC;  Surgeon: Melodi Lerner, MD;  Location: [facility];  Service: Orthopedics;  Laterality: Right;   IR NEPHROSTOMY PLACEMENT LEFT  08/22/2018   IR NEPHROSTOMY PLACEMENT LEFT  12/08/2018   KNEE ARTHROSCOPY Right 03-31-2001    dr amy @[facility]    NEPHROLITHOTOMY Left 10/20/2018   Procedure: LEFT NEPHROLITHOTOMY PERCUTANEOUS;  Surgeon: Cam Morene ORN, MD;  Location: [facility];  Service: Urology;  Laterality: Left;   ORIF HUMERUS FRACTURE Left 04/17/2019   Procedure: Open reduction internal fixation left supracondylar humerus fracture with olecranon osteotomy and ulnar nerve release and anterior transposition and repair of structures as necessary;  Surgeon: Camella Fallow, MD;  Location: [facility];  Service: Orthopedics;  Laterality: Left;  3 hrs   REDUCTION MAMMAPLASTY Bilateral 2001   ROBOT ASSISTED LAPAROSCOPIC NEPHRECTOMY Left 12/25/2018   Procedure: XI ROBOTIC ASSISTED LAPAROSCOPIC NEPHRECTOMY, LEFT FLANK EXPLORATION WITH REMOVAL OF BATTERY PACK;  Surgeon: Cam Morene ORN, MD;  Location: [facility];  Service: Urology;  Laterality: Left;   ROUX-EN-Y GASTRIC BYPASS  09-16-2003   dr [email or web address]. gladis  @[facility]    via Laparoscopy w/ gastrojejunostomy   SPINAL CORD STIMULATOR IMPLANT  2016   10-19-2018  PER PT HAS REMOTE BUT HAS NOT USED IT SINCE DEC 2018   TONSILLECTOMY AND ADENOIDECTOMY  1955   TUBAL LIGATION  1980's   Social History:  reports that she has never smoked. She has never used smokeless  tobacco. She reports that she does not drink alcohol and does not use drugs.  Allergies  Allergen Reactions   Zanaflex  [Tizanidine ]     hallucinations   Adhesive [Tape] Itching    Family History  Adopted: Yes  Problem Relation Age of Onset   COPD Mother    Emphysema Mother    Heart attack Mother    Other Mother        tobacco abuse    Prior to Admission medications   Medication Sig Start Date End Date Taking? Authorizing Provider  amLODipine  (NORVASC ) 5 MG tablet 1 po bid 09/03/24   Antonio Meth, [name], DO  amphetamine -dextroamphetamine  (ADDERALL  XR) 20 MG 24 hr capsule 1 po bid 06/21/24  Antonio Meth, [name], DO  Baclofen  5 MG TABS Take 1 tablet by mouth 2 (two) times daily as needed. 05/14/23   [provider]  brexpiprazole  (REXULTI ) 1 MG TABS tablet Take 1 tablet (1 mg total) by mouth daily. 08/07/24   Lowne Chase, [name], DO  clonazePAM  (KLONOPIN ) 0.5 MG tablet Take 1 tablet (0.5 mg total) by mouth 3 (three) times daily as needed. 08/29/24   Antonio Meth Rockers R, DO  loperamide  (IMODIUM ) 2 MG capsule TAKE 1 CAPSULE BY MOUTH DAILY AS NEEDED FOR DIARRHEA OR LOOSE STOOLS 07/27/24   Antonio Meth, Rockers SAUNDERS, DO  losartan  (COZAAR ) 100 MG tablet Take 1 tablet (100 mg total) by mouth daily. 09/03/24   Antonio Meth Rockers SAUNDERS, DO  ondansetron  (ZOFRAN ) 4 MG tablet Take 1 tablet (4 mg total) by mouth every 8 (eight) hours as needed for up to 5 days for nausea or vomiting. 10/10/24 10/15/24  Pamella Ozell LABOR, DO  ondansetron  (ZOFRAN -ODT) 8 MG disintegrating tablet Take 1 tablet (8 mg total) by mouth every 8 (eight) hours as needed for nausea or vomiting. 05/10/24   Antonio Meth Rockers SAUNDERS, DO  oxyCODONE  (ROXICODONE ) 15 MG immediate release tablet Take 1 tablet (15 mg total) by mouth 4 (four) times daily. 04/19/19   Camella Fallow, MD  SUMAtriptan  (IMITREX ) 50 MG tablet TAKE 1 TABLET BY MOUTH AT ONSET OF MIGRAINE; MAY REPEAT 1 TABLET IN 2 HOURS IF NEEDED. 07/27/24   Antonio Meth, Rockers SAUNDERS, DO   tiZANidine  (ZANAFLEX ) 4 MG tablet Take 4 mg by mouth. 07/05/24   [provider]  venlafaxine  XR (EFFEXOR -XR) 75 MG 24 hr capsule Take 2 capsules (150 mg total) by mouth daily. 08/20/24   Antonio Meth Rockers SAUNDERS, DO    Physical Exam: Vitals:   10/15/24 1859 10/15/24 1930 10/15/24 2030 10/15/24 2150  BP:  (!) 147/83 (!) 144/77 (!) 89/81  Pulse: (!) 111 (!) 53 (!) 111 87  Resp: 14 18 17 18   Temp:   98.7 F (37.1 C)   TempSrc:      SpO2: 94% 92% 91% 94%   Physical Exam:  General: pale, chronically ill appearing, frail HEENT: Normocephalic, atraumatic, PERRL, face is almost cushingoid Cardiovascular: Normal rate and rhythm. Distal pulses faint Pulmonary: Normal pulmonary effort, normal breath sounds Gastrointestinal: Nondistended abdomen, soft, non-tender, normoactive bowel sounds, no organomegaly Musculoskeletal: Kyphosis, nonpitting lower ext edema,  Skin: Skin is warm and dry.  Blue tent to fingers especially across knuckles.  It is not limited to her fingertips Neuro: Diffuse weakness, AAOx3. PSYCH: Attentive and cooperative  Data Reviewed:  Results for orders placed or performed during the hospital encounter of 10/15/24 (from the past 24 hours)  Lipase, blood     Status: Abnormal   Collection Time: 10/15/24  6:00 PM  Result Value Ref Range   Lipase 70 (H) 11 - 51 U/L  Comprehensive metabolic panel     Status: Abnormal   Collection Time: 10/15/24  6:00 PM  Result Value Ref Range   Sodium 138 135 - 145 mmol/L   Potassium 4.7 3.5 - 5.1 mmol/L   Chloride 105 98 - 111 mmol/L   CO2 22 22 - 32 mmol/L   Glucose, Bld 88 70 - 99 mg/dL   BUN 27 (H) 8 - 23 mg/dL   Creatinine, Ser 8.77 (H) 0.44 - 1.00 mg/dL   Calcium 9.2 8.9 - 89.6 mg/dL   Total Protein 7.9 6.5 - 8.1 g/dL   Albumin 4.1 3.5 -  5.0 g/dL   AST 18 15 - 41 U/L   ALT <5 0 - 44 U/L   Alkaline Phosphatase 161 (H) 38 - 126 U/L   Total Bilirubin 0.8 0.0 - 1.2 mg/dL   GFR, Estimated 46 (L) >60 mL/min   Anion gap  11 5 - 15  CBC     Status: Abnormal   Collection Time: 10/15/24  6:00 PM  Result Value Ref Range   WBC 8.6 4.0 - 10.5 K/uL   RBC 4.33 3.87 - 5.11 MIL/uL   Hemoglobin 14.9 12.0 - 15.0 g/dL   HCT 54.7 63.9 - 53.9 %   MCV 104.4 (H) 80.0 - 100.0 fL   MCH 34.4 (H) 26.0 - 34.0 pg   MCHC 33.0 30.0 - 36.0 g/dL   RDW 83.4 (H) 88.4 - 84.4 %   Platelets 221 150 - 400 K/uL   nRBC 0.0 0.0 - 0.2 %  I-Stat CG4 Lactic Acid     Status: None   Collection Time: 10/15/24  6:09 PM  Result Value Ref Range   Lactic Acid, Venous 0.9 0.5 - 1.9 mmol/L    CT abdomen pelvis IMPRESSION: 1. No acute findings in the abdomen or pelvis. 2. Osteoporosis with multilevel chronic vertebral body height loss with kyphoplasty at the L1 level. 3. Left nephrectomy.    Assessment and Plan: Persistent dehydration and tachycardia and failure to thrive post viral syndrome  - Continue IV fluids - Continue to encourage eating - Swallowing evaluation given her description of difficulty eating - Her tachycardia has resolved - PT /OT - Check CXR and UA  2. Chronic pain - Continue chronic narcotics  3.  History of multiple compression fractures - I do not see that she is on anything for osteoporosis which is surprising.  4. Htn - blood pressure has been low here in the ED so her antihypertensives will be held and IV fluids continued.    5.  Elevated MCV but normal hemoglobin   Advance Care Planning:   Code Status: Prior the patient names her daughter Corean is her surrogate decision maker and she wants to be full code.  Consults: None  Family Communication: None  Severity of Illness: The appropriate patient status for this patient is OBSERVATION. Observation status is judged to be reasonable and necessary in order to provide the required intensity of service to ensure the patient's safety. The patient's presenting symptoms, physical exam findings, and initial radiographic and laboratory data in the context of  their medical condition is felt to place them at decreased risk for further clinical deterioration. Furthermore, it is anticipated that the patient will be medically stable for discharge from the hospital within 2 midnights of admission.   Author: ARTHEA CHILD, MD 10/15/2024 9:58 PM  For on call review [email or web address].

## 2024-10-15 NOTE — ED Notes (Signed)
 Pt placed on nasal cannula @ 2 lpm while sleeping- O2 sat staying at 88% on r/a

## 2024-10-15 NOTE — ED Provider Notes (Addendum)
 [facility] AT [facility] Provider Note   CSN: [id number] Arrival date & time: 10/15/24  [id number]     Patient presents with: Weakness and Abdominal Pain   [name] is a 78 y.o. female with past medical history significant for hyperlipidemia, anxiety, depression, anemia, chronic back pain secondary to compression fractures, opioid dependence who presents with ongoing generalized weakness, fatigue following recent viral illness.  She was seen on 12/3, and had emergency department evaluation which showed dehydration but no evidence of acute surgical or infectious abnormality    Weakness Associated symptoms: abdominal pain   Abdominal Pain      Prior to Admission medications   Medication Sig Start Date End Date Taking? Authorizing Provider  amLODipine  (NORVASC ) 5 MG tablet 1 po bid 09/03/24   Antonio Meth, [name], DO  amphetamine -dextroamphetamine  (ADDERALL  XR) 20 MG 24 hr capsule 1 po bid 06/21/24   Antonio Meth, [name], DO  Baclofen  5 MG TABS Take 1 tablet by mouth 2 (two) times daily as needed. 05/14/23   [provider]  brexpiprazole  (REXULTI ) 1 MG TABS tablet Take 1 tablet (1 mg total) by mouth daily. 08/07/24   Lowne Chase, [name], DO  clonazePAM  (KLONOPIN ) 0.5 MG tablet Take 1 tablet (0.5 mg total) by mouth 3 (three) times daily as needed. 08/29/24   Antonio Meth Rockers R, DO  loperamide  (IMODIUM ) 2 MG capsule TAKE 1 CAPSULE BY MOUTH DAILY AS NEEDED FOR DIARRHEA OR LOOSE STOOLS 07/27/24   Antonio Meth, Rockers SAUNDERS, DO  losartan  (COZAAR ) 100 MG tablet Take 1 tablet (100 mg total) by mouth daily. 09/03/24   Antonio Meth Rockers SAUNDERS, DO  ondansetron  (ZOFRAN ) 4 MG tablet Take 1 tablet (4 mg total) by mouth every 8 (eight) hours as needed for up to 5 days for nausea or vomiting. 10/10/24 10/15/24  Pamella Ozell LABOR, DO  ondansetron  (ZOFRAN -ODT) 8 MG disintegrating tablet Take 1 tablet (8 mg total) by mouth every 8 (eight) hours as needed for  nausea or vomiting. 05/10/24   Antonio Meth Rockers SAUNDERS, DO  oxyCODONE  (ROXICODONE ) 15 MG immediate release tablet Take 1 tablet (15 mg total) by mouth 4 (four) times daily. 04/19/19   Camella Fallow, MD  SUMAtriptan  (IMITREX ) 50 MG tablet TAKE 1 TABLET BY MOUTH AT ONSET OF MIGRAINE; MAY REPEAT 1 TABLET IN 2 HOURS IF NEEDED. 07/27/24   Antonio Meth, Rockers SAUNDERS, DO  tiZANidine  (ZANAFLEX ) 4 MG tablet Take 4 mg by mouth. 07/05/24   [provider]  venlafaxine  XR (EFFEXOR -XR) 75 MG 24 hr capsule Take 2 capsules (150 mg total) by mouth daily. 08/20/24   Antonio Meth Rockers SAUNDERS, DO    Allergies: Zanaflex  [tizanidine ] and Adhesive [tape]    Review of Systems  Gastrointestinal:  Positive for abdominal pain.  Neurological:  Positive for weakness.  All other systems reviewed and are negative.   Updated Vital Signs BP (!) 144/77   Pulse (!) 111   Temp 98.7 F (37.1 C)   Resp 17   SpO2 91%   Physical Exam Vitals and nursing note reviewed.  Constitutional:      General: She is not in acute distress.    Appearance: Normal appearance.  HENT:     Head: Normocephalic and atraumatic.  Eyes:     General:        Right eye: No discharge.        Left eye: No discharge.  Cardiovascular:     Rate  and Rhythm: Normal rate and regular rhythm.     Heart sounds: No murmur heard.    No friction rub. No gallop.  Pulmonary:     Effort: Pulmonary effort is normal.     Breath sounds: Normal breath sounds.  Abdominal:     General: Bowel sounds are normal.     Palpations: Abdomen is soft.     Comments: Mild diffuse tenderness of the abdomen, no rebound, rigidity, guarding  Skin:    General: Skin is warm and dry.     Capillary Refill: Capillary refill takes less than 2 seconds.  Neurological:     Mental Status: She is alert and oriented to person, place, and time.  Psychiatric:        Mood and Affect: Mood normal.        Behavior: Behavior normal.     (all labs ordered are listed, but only abnormal  results are displayed) Labs Reviewed  LIPASE, BLOOD - Abnormal; Notable for the following components:      Result Value   Lipase 70 (*)    All other components within normal limits  COMPREHENSIVE METABOLIC PANEL WITH GFR - Abnormal; Notable for the following components:   BUN 27 (*)    Creatinine, Ser 1.22 (*)    Alkaline Phosphatase 161 (*)    GFR, Estimated 46 (*)    All other components within normal limits  CBC - Abnormal; Notable for the following components:   MCV 104.4 (*)    MCH 34.4 (*)    RDW 16.5 (*)    All other components within normal limits  URINALYSIS, ROUTINE W REFLEX MICROSCOPIC  I-STAT CG4 LACTIC ACID, ED  I-STAT CG4 LACTIC ACID, ED    EKG: None  Radiology: CT ABDOMEN PELVIS W CONTRAST Result Date: 10/15/2024 EXAM: CT ABDOMEN AND PELVIS WITH CONTRAST 10/15/2024 08:01:47 PM TECHNIQUE: CT of the abdomen and pelvis was performed with the administration of 80 mL of iohexol  (OMNIPAQUE ) 300 MG/ML solution. Multiplanar reformatted images are provided for review. Automated exposure control, iterative reconstruction, and/or weight-based adjustment of the mA/kV was utilized to reduce the radiation dose to as low as reasonably achievable. COMPARISON: CT abdomen and pelvis 02/01/2022. CLINICAL HISTORY: Abdominal pain, acute, nonlocalized. FINDINGS: LOWER CHEST: Bibasilar atelectasis, right middle lobe bronchiectasis. Question Partially visualized right hilar lymph nodes. LIVER: The liver is unremarkable. GALLBLADDER AND BILE DUCTS: Gallbladder is unremarkable. No biliary ductal dilatation. SPLEEN: No acute abnormality. PANCREAS: No acute abnormality. ADRENAL GLANDS: No acute abnormality. KIDNEYS, URETERS AND BLADDER: Status post left nephrectomy. No stones in the right kidney or ureter. No right hydronephrosis. No perinephric or periureteral stranding. Urinary bladder is unremarkable. No filling defects of the partially visualized right collecting system on delayed imaging. GI AND  BOWEL: Roux-en-y gastric bypass surgical changes noted. No small or large bowel thickening or dilatation. The appendix is not definitely identified with no inflammatory changes in the right lower quadrant to suggest acute appendicitis. PERITONEUM AND RETROPERITONEUM: No ascites. No free air. VASCULATURE: Moderate atherosclerotic plaque. Aorta is normal in caliber. LYMPH NODES: No lymphadenopathy. REPRODUCTIVE ORGANS: No acute abnormality. BONES AND SOFT TISSUES: Diffusely decreased bone density. Multilevel chronic vertebral body height loss with kyphoplasty at the L1 level. Intramedullary nail fixation of the right femur partially visualized. Old healed right pelvic fractures. No focal soft tissue abnormality. IMPRESSION: 1. No acute findings in the abdomen or pelvis. 2. Osteoporosis with multilevel chronic vertebral body height loss with kyphoplasty at the L1 level. 3. Left nephrectomy. Electronically  signed by: Morgane Naveau MD 10/15/2024 08:13 PM EST RP Workstation: [id number]     Procedures   Medications Ordered in the ED  sodium chloride  0.9 % bolus 1,000 mL (0 mLs Intravenous Stopped 10/15/24 2047)  ondansetron  (ZOFRAN ) injection 4 mg (4 mg Intravenous Given 10/15/24 1815)  acetaminophen  (TYLENOL ) tablet 650 mg (650 mg Oral Given 10/15/24 1817)  morphine  (PF) 4 MG/ML injection 4 mg (4 mg Intravenous Given 10/15/24 1807)  sodium chloride  0.9 % bolus 1,000 mL (0 mLs Intravenous Stopped 10/15/24 2139)  iohexol  (OMNIPAQUE ) 300 MG/ML solution 80 mL (80 mLs Intravenous Contrast Given 10/15/24 1951)                                    Medical Decision Making Amount and/or Complexity of Data Reviewed Labs: ordered. Radiology: ordered.  Risk OTC drugs. Prescription drug management.   This patient is a 78 y.o. female  who presents to the ED for concern of weakness.   Differential diagnoses prior to evaluation: The emergent differential diagnosis includes, but is not limited to,  CVA, spinal cord  injury, ACS, arrhythmia, syncope, orthostatic hypotension, sepsis, hypoglycemia, hypoxia, electrolyte disturbance, endocrine disorder, anemia, environmental exposure, polypharmacy . This is not an exhaustive differential.   Past Medical History / Co-morbidities / Social History: hyperlipidemia, anxiety, depression, anemia, chronic back pain secondary to compression fractures, opioid dependence  Additional history: Chart reviewed. Pertinent results include: Reviewed lab work, imaging from previous emergency department visit  Physical Exam: Physical exam performed. The pertinent findings include: She is tachycardic in the emergency department and hypertensive, blood pressure 163/100, she does have an elevated temperature at 99.3, stable oxygen saturation's and respiratory rate  Lab Tests/Imaging studies: I personally interpreted labs/imaging and the pertinent results include: CBC is overall unremarkable, CMP notable for mildly elevated BUN, creatinine consistent with dehydration, elevated alkaline phosphatase also noted, mildly elevated lipase, not 3 times the upper limit of normal.  Normal lactic acid.  Repeat CT scan shows no significant changes compared to recent baseline I agree with the radiologist interpretation.  Cardiac monitoring: EKG obtained and interpreted by myself and attending physician which shows: Sinus tachycardia   Medications: I ordered medication including morphine , Zofran , Tylenol , fluid bolus.  I have reviewed the patients home medicines and have made adjustments as needed.   On reassessment patient continuing to have ongoing weakness, tachycardia, given her advanced age, multiple comorbidities we will consult for admission for dehydration, observation, weakness, spoke with the hospitalist, Dr. Arthea, who reports  Disposition: After consideration of the diagnostic results and the patients response to treatment, I feel that patient would benefit from hospital admission  for weakness, dehydration.    Final diagnoses:  Dehydration    ED Discharge Orders     None          Rosan Sherlean DEL, PA-C 10/15/24 2141    Rosan Sherlean DEL DEVONNA 10/15/24 2144    Francesca Elsie CROME, MD 10/15/24 2329

## 2024-10-16 ENCOUNTER — Encounter (HOSPITAL_COMMUNITY): Payer: Self-pay | Admitting: Internal Medicine

## 2024-10-16 DIAGNOSIS — R000 Tachycardia, unspecified: Secondary | ICD-10-CM | POA: Diagnosis not present

## 2024-10-16 LAB — URINALYSIS, ROUTINE W REFLEX MICROSCOPIC
Bilirubin Urine: NEGATIVE
Glucose, UA: NEGATIVE mg/dL
Hgb urine dipstick: NEGATIVE
Ketones, ur: NEGATIVE mg/dL
Nitrite: POSITIVE — AB
Protein, ur: NEGATIVE mg/dL
Specific Gravity, Urine: 1.031 — ABNORMAL HIGH (ref 1.005–1.030)
pH: 6 (ref 5.0–8.0)

## 2024-10-16 LAB — COMPREHENSIVE METABOLIC PANEL WITH GFR
ALT: <5 U/L (ref 0–44)
AST: 13 U/L — ABNORMAL LOW (ref 15–41)
Albumin: 2.9 g/dL — ABNORMAL LOW (ref 3.5–5.0)
Alkaline Phosphatase: 115 U/L (ref 38–126)
Anion gap: 6 (ref 5–15)
BUN: 21 mg/dL (ref 8–23)
CO2: 22 mmol/L (ref 22–32)
Calcium: 8.3 mg/dL — ABNORMAL LOW (ref 8.9–10.3)
Chloride: 110 mmol/L (ref 98–111)
Creatinine, Ser: 1.02 mg/dL — ABNORMAL HIGH (ref 0.44–1.00)
GFR, Estimated: 57 mL/min — ABNORMAL LOW (ref >60)
Glucose, Bld: 99 mg/dL (ref 70–99)
Potassium: 4.1 mmol/L (ref 3.5–5.1)
Sodium: 138 mmol/L (ref 135–145)
Total Bilirubin: 0.5 mg/dL (ref 0.0–1.2)
Total Protein: 5.5 g/dL — ABNORMAL LOW (ref 6.5–8.1)

## 2024-10-16 LAB — CBC
HCT: 33.2 % — ABNORMAL LOW (ref 36.0–46.0)
Hemoglobin: 10.7 g/dL — ABNORMAL LOW (ref 12.0–15.0)
MCH: 33.6 pg (ref 26.0–34.0)
MCHC: 32.2 g/dL (ref 30.0–36.0)
MCV: 104.4 fL — ABNORMAL HIGH (ref 80.0–100.0)
Platelets: 177 K/uL (ref 150–400)
RBC: 3.18 MIL/uL — ABNORMAL LOW (ref 3.87–5.11)
RDW: 16.4 % — ABNORMAL HIGH (ref 11.5–15.5)
WBC: 5.7 K/uL (ref 4.0–10.5)
nRBC: 0 % (ref 0.0–0.2)

## 2024-10-16 LAB — TSH: TSH: 1.26 u[IU]/mL (ref 0.350–4.500)

## 2024-10-16 LAB — MAGNESIUM: Magnesium: 2.2 mg/dL (ref 1.7–2.4)

## 2024-10-16 MED ORDER — SUMATRIPTAN SUCCINATE 50 MG PO TABS
50.0000 mg | ORAL_TABLET | ORAL | Status: AC | PRN
  Administered 2024-10-16 – 2024-10-17 (×2): 50 mg via ORAL
  Filled 2024-10-16 (×3): qty 1

## 2024-10-16 MED ORDER — TIZANIDINE HCL 4 MG PO TABS
4.0000 mg | ORAL_TABLET | Freq: Two times a day (BID) | ORAL | Status: DC | PRN
  Administered 2024-10-16: 4 mg via ORAL
  Filled 2024-10-16: qty 1

## 2024-10-16 MED ORDER — OXYCODONE HCL 5 MG PO TABS
5.0000 mg | ORAL_TABLET | Freq: Four times a day (QID) | ORAL | Status: DC | PRN
  Administered 2024-10-16 – 2024-10-18 (×6): 5 mg via ORAL
  Filled 2024-10-16 (×6): qty 1

## 2024-10-16 MED ORDER — AMLODIPINE BESYLATE 10 MG PO TABS
5.0000 mg | ORAL_TABLET | Freq: Every day | ORAL | Status: DC
  Administered 2024-10-16 – 2024-10-18 (×3): 5 mg via ORAL
  Filled 2024-10-16 (×3): qty 1

## 2024-10-16 MED ORDER — SODIUM CHLORIDE 0.9 % IV SOLN
1.0000 g | INTRAVENOUS | Status: DC
  Administered 2024-10-16 – 2024-10-18 (×3): 1 g via INTRAVENOUS
  Filled 2024-10-16 (×3): qty 10

## 2024-10-16 MED ORDER — BREXPIPRAZOLE 1 MG PO TABS
1.0000 mg | ORAL_TABLET | Freq: Every day | ORAL | Status: DC
  Administered 2024-10-17 – 2024-10-18 (×2): 1 mg via ORAL
  Filled 2024-10-16 (×4): qty 1

## 2024-10-16 MED ORDER — VENLAFAXINE HCL ER 150 MG PO CP24
150.0000 mg | ORAL_CAPSULE | Freq: Every day | ORAL | Status: DC
  Administered 2024-10-16 – 2024-10-18 (×3): 150 mg via ORAL
  Filled 2024-10-16 (×3): qty 1

## 2024-10-16 MED ORDER — CLONAZEPAM 0.5 MG PO TABS
0.5000 mg | ORAL_TABLET | Freq: Three times a day (TID) | ORAL | Status: DC | PRN
  Administered 2024-10-16 – 2024-10-17 (×4): 0.5 mg via ORAL
  Filled 2024-10-16 (×4): qty 1

## 2024-10-16 NOTE — Care Management Obs Status (Signed)
 MEDICARE OBSERVATION STATUS NOTIFICATION   Patient Details  Name: [name] MRN: [id number] Date of Birth: 1946/09/12   Medicare Observation Status Notification Given:  Yes    Tawni CHRISTELLA Eva, LCSW 10/16/2024, 4:10 PM

## 2024-10-16 NOTE — Evaluation (Signed)
 Occupational Therapy Evaluation Patient Details Name: [name] MRN: [id number] DOB: 1946-03-05 Today's Date: 10/16/2024   History of Present Illness   Pt is a 78 y.o. female who presented from home 10/15/24 due to weakness and abdominal pain. Pt noted in Ed to be dehydrated. Pt admitted for tachycardia. CXR and UA pending. PMH: HTN, tremor, depression, 1 kidney, CKD, severe malnutrition, bypass 2004, spinal cord stimulator 2016, scoliosis     Clinical Impressions Pt reported they have had an increase in level of assist from daughter or grand daughter for ADL/IADLs. Also a decrease in mobility as she has been sleeping in lift recliner and transferring to [facility] and then would only furniture walk into the bathroom but prior to this was ambulating. Pt at this time required mod I for bed mobility and CGA-min assist for sit to stand and CGA to min assist for ambulation with RW. Pt at this time pending SNF vs HH as reported they wanted to further discuss with her husband Vilinda). At this time recommendation for SNF but may progress to [facility] level.     If plan is discharge home, recommend the following:   A little help with walking and/or transfers;A little help with bathing/dressing/bathroom;Assistance with cooking/housework;Assist for transportation;Help with stairs or ramp for entrance     Functional Status Assessment   Patient has had a recent decline in their functional status and demonstrates the ability to make significant improvements in function in a reasonable and predictable amount of time.     Equipment Recommendations   BSC/3in1     Recommendations for Other Services         Precautions/Restrictions   Precautions Precautions: Fall Recall of Precautions/Restrictions: Intact Restrictions Weight Bearing Restrictions Per Provider Order: No     Mobility Bed Mobility Overal bed mobility: Modified Independent             General bed mobility comments: Had HOB  elevated  and used rails    Transfers Overall transfer level: Needs assistance Equipment used: Rolling walker (2 wheels) Transfers: Sit to/from Stand Sit to Stand: Contact guard assist, Min assist           General transfer comment: increase in time and cues on positioning self      Balance Overall balance assessment: Needs assistance Sitting-balance support: Feet supported Sitting balance-Leahy Scale: Good     Standing balance support: Bilateral upper extremity supported Standing balance-Leahy Scale: Fair Standing balance comment: reliant on RW with mobility, pt reported already seeing an improvement                           ADL either performed or assessed with clinical judgement   ADL Overall ADL's : Needs assistance/impaired Eating/Feeding: Independent;Sitting   Grooming: Set up;Sitting   Upper Body Bathing: Set up;Sitting   Lower Body Bathing: Minimal assistance;Sit to/from stand;Sitting/lateral leans;Moderate assistance   Upper Body Dressing : Set up;Sitting   Lower Body Dressing: Minimal assistance;Sit to/from stand;Sitting/lateral leans;Moderate assistance   Toilet Transfer: Contact guard assist;Rolling walker (2 wheels)   Toileting- Clothing Manipulation and Hygiene: Minimal assistance;Sit to/from stand       Functional mobility during ADLs: Contact guard assist;Minimal assistance;Rolling walker (2 wheels)       Vision Baseline Vision/History: 1 Wears glasses Ability to See in Adequate Light: 0 Adequate Patient Visual Report: No change from baseline Vision Assessment?: Wears glasses for reading;Wears glasses for driving     Perception Perception: Within Functional  Limits       Praxis Praxis: WFL       Pertinent Vitals/Pain Pain Assessment Pain Assessment: No/denies pain     Extremity/Trunk Assessment Upper Extremity Assessment Upper Extremity Assessment: Generalized weakness (tremors but has at baseline)   Lower Extremity  Assessment Lower Extremity Assessment: Defer to PT evaluation   Cervical / Trunk Assessment Cervical / Trunk Assessment: Kyphotic (scolosis)   Communication Communication Communication: No apparent difficulties Factors Affecting Communication: Hearing impaired (wears hearing aides)   Cognition Arousal: Alert Behavior During Therapy: WFL for tasks assessed/performed Cognition: No apparent impairments                               Following commands: Intact       Cueing  General Comments   Cueing Techniques: Verbal cues      Exercises     Shoulder Instructions      Home Living Family/patient expects to be discharged to:: Private residence Living Arrangements: Spouse/significant other Available Help at Discharge: Family Type of Home: House Home Access: Level entry     Home Layout: One level     Bathroom Shower/Tub: Producer, Television/film/video: Standard     Home Equipment: Agricultural Consultant (2 wheels);Grab bars - tub/shower;Wheelchair - manual;Shower seat          Prior Functioning/Environment Prior Level of Function : Needs assist       Physical Assist : Mobility (physical);ADLs (physical) Mobility (physical): Transfers;Gait ADLs (physical): IADLs Mobility Comments: Per pt reported they have mainly been WC level and attempting to transfer however they could to [facility] but then would attempt to take a couple steps into the bathroom with furnature walking. ADLs Comments: Pt reported been only taking bird baths while at the sink and family assisted as needed for ADLS/IADL. Per pt also reported they sleep in recliner at baseline.    OT Problem List: Decreased strength;Decreased activity tolerance;Decreased safety awareness;Decreased knowledge of use of DME or AE   OT Treatment/Interventions: Self-care/ADL training;Therapeutic exercise;DME and/or AE instruction;Therapeutic activities;Patient/family education;Balance training      OT Goals(Current  goals can be found in the care plan section)   Acute Rehab OT Goals Patient Stated Goal: to get stronger OT Goal Formulation: With patient Time For Goal Achievement: 10/30/24 Potential to Achieve Goals: Good   OT Frequency:  Min 2X/week    Co-evaluation              AM-PAC OT 6 Clicks Daily Activity     Outcome Measure Help from another person eating meals?: None Help from another person taking care of personal grooming?: A Little Help from another person toileting, which includes using toliet, bedpan, or urinal?: A Little Help from another person bathing (including washing, rinsing, drying)?: A Lot Help from another person to put on and taking off regular upper body clothing?: A Little Help from another person to put on and taking off regular lower body clothing?: A Little 6 Click Score: 18   End of Session Equipment Utilized During Treatment: Gait belt;Rolling walker (2 wheels) Nurse Communication: Mobility status  Activity Tolerance: Patient tolerated treatment well Patient left: in chair;with call bell/phone within reach;with chair alarm set  OT Visit Diagnosis: Unsteadiness on feet (R26.81);Other abnormalities of gait and mobility (R26.89);Muscle weakness (generalized) (M62.81)                Time: 9267-9191 OT Time Calculation (min): 36 min Charges:  OT General Charges $OT Visit: 1 Visit OT Evaluation $OT Eval Low Complexity: 1 Low OT Treatments $Self Care/Home Management : 8-22 mins  Warrick POUR OTR/L  Acute Rehab Services  [phone] office number   Warrick Berber 10/16/2024, 8:17 AM

## 2024-10-16 NOTE — Consult Note (Addendum)
 WOC Nurse Consult Note: Reason for Consult: sacral wound  Wound type: 1.  Stage 3 Pressure injury sacrum  2.  ?DTPI right low back/upper sacral area purple maroon discoloration  Pressure Injury POA: Yes Measurement: see nursing flowsheet  Wound bed:2 separate areas right side 80% red 20% tan; left side appears to be healing with dry tissue (not open)  Drainage (amount, consistency, odor) none per nurse flowsheet  Periwound: dark discoloration indicating may have started as a deep tissue pressure injury; superior to open wound appears to be deep tissue pressure injury purple maroon discoloration  Dressing procedure/placement/frequency: Cleanse sacral/buttocks wounds with Vashe, do not rinse. Apply Xeroform gauze ([place] [phone]) to wound beds daily and secure with silicone foam.   POC discussed with bedside nurse. WOC team will not follow. Re-consult if further needs arise.   Thank you,    Powell Bar MSN, RN-BC, [employer]

## 2024-10-16 NOTE — Progress Notes (Signed)
 PROGRESS NOTE    [name]  FMW:[id number] DOB: Jan 11, 1946 DOA: 10/15/2024 PCP: Antonio Cyndee Jamee JONELLE, DO   Brief Narrative:  This 78 y.o. female with medical history significant for hypertension, tremors, depression, s/p Left Nephrectomy, CKD, and severe malnutrition.  She has history of gastric bypass in 2004 and spinal cord stimulator in 2016.  She has baseline significant rib to pelvis deformity and is forward flexed.  She follows with [facility] for this. Patient reports she has received Flu vaccine 2 months ago, Since then she has multiple ER visits for flu like symptoms.  She reports coming to the ED 5 days ago with similar symptoms.  She reports soreness in the throat, and does not feel good.  Her workup in the ED included CT abdomen and pelvis which was unremarkable.  Clinically patient looked quite dehydrated.  Patient was admitted for further evaluation and started on IV hydration.  Assessment & Plan:   Principal Problem:   Tachycardia Active Problems:   Protein-calorie malnutrition, severe   Deep vein thrombosis (DVT) of proximal vein of both lower extremities, unspecified chronicity (HCC)  Persistent dehydration: Failure to thrive: Patient presented with tachycardia, weakness , Appears dehydrated and has decreased oral intake post viral syndrome. She appears profoundly dehydrated. Continue IV fluid resuscitation. Speech and swallow evaluation completed, started on regular diet.. Her tachycardia has improved.  Chest x-ray unremarkable. UA shows UTI,  started on antibiotic ceftriaxone . PT and OT evaluation  Chronic pain syndrome Continue chronic narcotics.  History of multiple compression fractures She is not on anything for osteoporosis.  Essential hypertension Blood pressure has been low here so antihypertensives are on hold.  UTI Started on ceftriaxone ,  follow-up urine cultures.  CKD Serum creatinine at baseline. Avoid  Nephrotoxic medications.  DVT prophylaxis: Heparin  sq Code Status: Full code Family Communication: No family at bed side. Disposition Plan:    Status is: Observation The patient remains OBS appropriate and will d/c before 2 midnights.   Admitted for severe dehydration associated with failure to thrive. PT and OT evaluation.  Consultants:  None  Procedures: None  Antimicrobials:  Anti-infectives (From admission, onward)    Start     Dose/Rate Route Frequency Ordered Stop   10/16/24 1000  cefTRIAXone  (ROCEPHIN ) 1 g in sodium chloride  0.9 % 100 mL IVPB        1 g 200 mL/hr over 30 Minutes Intravenous Every 24 hours 10/16/24 0849        Subjective: Patient was seen and examined at bedside.  Overnight events noted. Patient reports feeling weak and tired and fatigued. She asked to resume her pain medications.  Objective: Vitals:   10/16/24 0119 10/16/24 0556 10/16/24 0900 10/16/24 1256  BP:  129/86 (!) 149/86 (!) 158/92  Pulse:  93 97 99  Resp:  18 20 20   Temp:  98.1 F (36.7 C) 99.2 F (37.3 C) 99.8 F (37.7 C)  TempSrc:   Oral Oral  SpO2:  93% 95% 91%  Weight: 61.4 kg     Height: 5' 5 (1.651 m)       Intake/Output Summary (Last 24 hours) at 10/16/2024 1335 Last data filed at 10/16/2024 1003 Gross per 24 hour  Intake 3230 ml  Output 700 ml  Net 2530 ml   Filed Weights   10/16/24 0119  Weight: 61.4 kg    Examination:  General exam: Appears calm and comfortable, not in any acute distress. Respiratory system: Clear to auscultation. Respiratory effort normal.  RR 14 Cardiovascular system: S1 & S2 heard, RRR. No JVD, murmurs, rubs, gallops or clicks.  Gastrointestinal system: Abdomen is nondistended, soft and nontender. Normal bowel sounds heard. Central nervous system: Alert and oriented x 3. No focal neurological deficits. Extremities: No edema , No cyanosis, no clubbing. Skin: No rashes, lesions or ulcers Psychiatry: Judgement and insight appear normal.  Mood & affect appropriate.     Data Reviewed: I have personally reviewed following labs and imaging studies  CBC: Recent Labs  Lab 10/10/24 0733 10/15/24 1800 10/16/24 0344  WBC 7.2 8.6 5.7  HGB 14.5 14.9 10.7*  HCT 43.9 45.2 33.2*  MCV 100.9* 104.4* 104.4*  PLT 230 221 177   Basic Metabolic Panel: Recent Labs  Lab 10/10/24 0733 10/15/24 1800 10/16/24 0344  NA 136 138 138  K 3.8 4.7 4.1  CL 96* 105 110  CO2 22 22 22   GLUCOSE 125* 88 99  BUN 12 27* 21  CREATININE 1.11* 1.22* 1.02*  CALCIUM 9.7 9.2 8.3*  MG  --   --  2.2   GFR: Estimated Creatinine Clearance: 42.2 mL/min (A) (by C-G formula based on SCr of 1.02 mg/dL (H)). Liver Function Tests: Recent Labs  Lab 10/10/24 0733 10/15/24 1800 10/16/24 0344  AST 28 18 13*  ALT <5 <5 <5  ALKPHOS 183* 161* 115  BILITOT 0.6 0.8 0.5  PROT 8.3* 7.9 5.5*  ALBUMIN 4.5 4.1 2.9*   Recent Labs  Lab 10/10/24 0733 10/15/24 1800  LIPASE 49 70*   No results for input(s): AMMONIA in the last 168 hours. Coagulation Profile: No results for input(s): INR, PROTIME in the last 168 hours. Cardiac Enzymes: No results for input(s): CKTOTAL, CKMB, CKMBINDEX, TROPONINI in the last 168 hours. BNP (last 3 results) No results for input(s): PROBNP in the last 8760 hours. HbA1C: No results for input(s): HGBA1C in the last 72 hours. CBG: No results for input(s): GLUCAP in the last 168 hours. Lipid Profile: No results for input(s): CHOL, HDL, LDLCALC, TRIG, CHOLHDL, LDLDIRECT in the last 72 hours. Thyroid Function Tests: Recent Labs    10/16/24 0344  TSH 1.260   Anemia Panel: No results for input(s): VITAMINB12, FOLATE, FERRITIN, TIBC, IRON, RETICCTPCT in the last 72 hours. Sepsis Labs: Recent Labs  Lab 10/10/24 9266 10/10/24 1031 10/15/24 1809  LATICACIDVEN 3.1* 1.7 0.9    Recent Results (from the past 240 hours)  Resp panel by RT-PCR (RSV, Flu A&B, Covid) Anterior Nasal Swab      Status: None   Collection Time: 10/10/24  7:33 AM   Specimen: Anterior Nasal Swab  Result Value Ref Range Status   SARS Coronavirus 2 by RT PCR NEGATIVE NEGATIVE Final    Comment: (NOTE) SARS-CoV-2 target nucleic acids are NOT DETECTED.  The SARS-CoV-2 RNA is generally detectable in upper respiratory specimens during the acute phase of infection. The lowest concentration of SARS-CoV-2 viral copies this assay can detect is 138 copies/mL. A negative result does not preclude SARS-Cov-2 infection and should not be used as the sole basis for treatment or other patient management decisions. A negative result may occur with  improper specimen collection/handling, submission of specimen other than nasopharyngeal swab, presence of viral mutation(s) within the areas targeted by this assay, and inadequate number of viral copies(<138 copies/mL). A negative result must be combined with clinical observations, patient history, and epidemiological information. The expected result is Negative.  Fact Sheet for Patients:  bloggercourse.com  Fact Sheet for Healthcare Providers:  seriousbroker.it  This test is  no t yet approved or cleared by the [place]  FDA and  has been authorized for detection and/or diagnosis of SARS-CoV-2 by FDA under an Emergency Use Authorization (EUA). This EUA will remain  in effect (meaning this test can be used) for the duration of the COVID-19 declaration under Section 564(b)(1) of the Act, 21 U.S.C.section 360bbb-3(b)(1), unless the authorization is terminated  or revoked sooner.       Influenza A by PCR NEGATIVE NEGATIVE Final   Influenza B by PCR NEGATIVE NEGATIVE Final    Comment: (NOTE) The Xpert Xpress SARS-CoV-2/FLU/RSV plus assay is intended as an aid in the diagnosis of influenza from Nasopharyngeal swab specimens and should not be used as a sole basis for treatment. Nasal washings and aspirates are  unacceptable for Xpert Xpress SARS-CoV-2/FLU/RSV testing.  Fact Sheet for Patients: bloggercourse.com  Fact Sheet for Healthcare Providers: seriousbroker.it  This test is not yet approved or cleared by the [place]  FDA and has been authorized for detection and/or diagnosis of SARS-CoV-2 by FDA under an Emergency Use Authorization (EUA). This EUA will remain in effect (meaning this test can be used) for the duration of the COVID-19 declaration under Section 564(b)(1) of the Act, 21 U.S.C. section 360bbb-3(b)(1), unless the authorization is terminated or revoked.     Resp Syncytial Virus by PCR NEGATIVE NEGATIVE Final    Comment: (NOTE) Fact Sheet for Patients: bloggercourse.com  Fact Sheet for Healthcare Providers: seriousbroker.it  This test is not yet approved or cleared by the [place]  FDA and has been authorized for detection and/or diagnosis of SARS-CoV-2 by FDA under an Emergency Use Authorization (EUA). This EUA will remain in effect (meaning this test can be used) for the duration of the COVID-19 declaration under Section 564(b)(1) of the Act, 21 U.S.C. section 360bbb-3(b)(1), unless the authorization is terminated or revoked.  Performed at [facility], [address] [place], [place] [address]     Radiology Studies: DG CHEST PORT 1 VIEW Result Date: 10/15/2024 CLINICAL DATA:  Failure to thrive EXAM: PORTABLE CHEST 1 VIEW COMPARISON:  08/23/2024 FINDINGS: Chronic elevation of right diaphragm. Ascending thoracic stimulator leads. No acute airspace disease. Stable cardiomediastinal silhouette with aortic atherosclerosis IMPRESSION: No active disease. Electronically Signed   By: Luke Bun M.D.   On: 10/15/2024 23:22   CT ABDOMEN PELVIS W CONTRAST Result Date: 10/15/2024 EXAM: CT ABDOMEN AND PELVIS WITH CONTRAST 10/15/2024 08:01:47 PM TECHNIQUE: CT  of the abdomen and pelvis was performed with the administration of 80 mL of iohexol  (OMNIPAQUE ) 300 MG/ML solution. Multiplanar reformatted images are provided for review. Automated exposure control, iterative reconstruction, and/or weight-based adjustment of the mA/kV was utilized to reduce the radiation dose to as low as reasonably achievable. COMPARISON: CT abdomen and pelvis 02/01/2022. CLINICAL HISTORY: Abdominal pain, acute, nonlocalized. FINDINGS: LOWER CHEST: Bibasilar atelectasis, right middle lobe bronchiectasis. Question Partially visualized right hilar lymph nodes. LIVER: The liver is unremarkable. GALLBLADDER AND BILE DUCTS: Gallbladder is unremarkable. No biliary ductal dilatation. SPLEEN: No acute abnormality. PANCREAS: No acute abnormality. ADRENAL GLANDS: No acute abnormality. KIDNEYS, URETERS AND BLADDER: Status post left nephrectomy. No stones in the right kidney or ureter. No right hydronephrosis. No perinephric or periureteral stranding. Urinary bladder is unremarkable. No filling defects of the partially visualized right collecting system on delayed imaging. GI AND BOWEL: Roux-en-y gastric bypass surgical changes noted. No small or large bowel thickening or dilatation. The appendix is not definitely identified with no inflammatory changes in the right lower quadrant  to suggest acute appendicitis. PERITONEUM AND RETROPERITONEUM: No ascites. No free air. VASCULATURE: Moderate atherosclerotic plaque. Aorta is normal in caliber. LYMPH NODES: No lymphadenopathy. REPRODUCTIVE ORGANS: No acute abnormality. BONES AND SOFT TISSUES: Diffusely decreased bone density. Multilevel chronic vertebral body height loss with kyphoplasty at the L1 level. Intramedullary nail fixation of the right femur partially visualized. Old healed right pelvic fractures. No focal soft tissue abnormality. IMPRESSION: 1. No acute findings in the abdomen or pelvis. 2. Osteoporosis with multilevel chronic vertebral body height loss  with kyphoplasty at the L1 level. 3. Left nephrectomy. Electronically signed by: Morgane Naveau MD 10/15/2024 08:13 PM EST RP Workstation: [id number]   Scheduled Meds:  amLODipine   5 mg Oral Daily   brexpiprazole   1 mg Oral Daily   heparin   5,000 Units Subcutaneous Q8H   venlafaxine  XR  150 mg Oral Daily   Continuous Infusions:  cefTRIAXone  (ROCEPHIN )  IV 1 g (10/16/24 0925)     LOS: 0 days    Time spent: 50 mins    Darcel Dawley, MD [facility]   If 7PM-7AM, please contact night-coverage

## 2024-10-16 NOTE — TOC Initial Note (Signed)
 Transition of Care [facility]) - Initial/Assessment Note    Patient Details  Name: [name] MRN: [id number] Date of Birth: 1946/07/12  Transition of Care [facility]) CM/SW Contact:    Tawni CHRISTELLA Eva, LCSW Phone Number: 10/16/2024, 4:37 PM  Clinical Narrative:                  CSW met with the pt and the pt's daughter, Corean, at the bedside to discuss discharge planning. Pt's daughter reported that the pt lives at home with her husband. She reports having no home services in place. She has a walker, cane, and wheelchair available at home.  The pt's daughter requested a bedside commode. She also stated that she would like her mother to return home with home health services and does not want her mother placed in a facility, saying, "Not before Christmas." The pt's daughter stated no preference for a home health agency. ICM to follow.   Expected Discharge Plan: Home w Home Health Services Barriers to Discharge: Continued Medical Work up   Patient Goals and CMS Choice Patient states their goals for this hospitalization and ongoing recovery are:: retrun home with home health          Expected Discharge Plan and Services       Living arrangements for the past 2 months: Single Family Home                                      Prior Living Arrangements/Services Living arrangements for the past 2 months: Single Family Home Lives with:: Spouse Patient language and need for interpreter reviewed:: Yes Do you feel safe going back to the place where you live?: Yes      Need for Family Participation in Patient Care: Yes (Comment) Care giver support system in place?: Yes (comment) Current home services: DME Criminal Activity/Legal Involvement Pertinent to Current Situation/Hospitalization: No - Comment as needed  Activities of Daily Living   ADL Screening (condition at time of admission) Independently performs ADLs?: No Does the patient have a NEW difficulty with  bathing/dressing/toileting/self-feeding that is expected to last >3 days?: No Does the patient have a NEW difficulty with getting in/out of bed, walking, or climbing stairs that is expected to last >3 days?: No Does the patient have a NEW difficulty with communication that is expected to last >3 days?: No Is the patient deaf or have difficulty hearing?: Yes Does the patient have difficulty seeing, even when wearing glasses/contacts?: No Does the patient have difficulty concentrating, remembering, or making decisions?: No  Permission Sought/Granted                  Emotional Assessment Appearance:: Appears stated age            Admission diagnosis:  Dehydration [E86.0] Tachycardia [R00.0] Patient Active Problem List   Diagnosis Date Noted   Tachycardia 10/15/2024   Tremor 09/03/2024   Anxiety 09/03/2024   Opioid dependence with withdrawal (HCC) 05/20/2023   Deep vein thrombosis (DVT) of proximal vein of both lower extremities, unspecified chronicity (HCC) 05/20/2023   Elevated BP without diagnosis of hypertension 11/12/2021   Dysuria 10/16/2019   Suspected COVID-19 virus infection 10/16/2019   Closed nondisplaced intertrochanteric fracture of right femur (HCC) 05/31/2019   Closed displaced comminuted supracondylar fracture of left humerus without intercondylar fracture 04/17/2019   Nonfunctioning kidney 12/25/2018   Hydronephrosis with ureteral stricture, not elsewhere classified  12/21/2018   Malnutrition of moderate degree 12/08/2018   Delirium 12/07/2018   Hydronephrosis of left kidney 12/07/2018   Pyelonephritis 12/07/2018   Protein-calorie malnutrition, severe 08/24/2018   Pressure injury of skin 08/22/2018   Obstruction of left ureteropelvic junction (UPJ) due to stone 08/21/2018   Hypoalbuminemia 08/21/2018   Chronic pain disorder 08/21/2018   Thrombocytopenia 08/21/2018   Acute renal failure (ARF) 08/21/2018   Compression fracture of spine (HCC) 08/21/2018    Acute respiratory failure with hypoxemia (HCC)    Hematoma 06/03/2018   Dysphagia 02/07/2018   Generalized anxiety disorder 07/04/2017   Nonintractable headache 07/04/2017   Exertional dyspnea 08/05/2016   Sepsis (HCC) 03/07/2015   Hypokalemia 03/07/2015   Acute encephalopathy 03/06/2015   Fever 03/06/2015   Chronic back pain 01/15/2015   Obesity (BMI 30-39.9) 10/15/2013   Pulmonary nodules 10/15/2013   CAP (community acquired pneumonia) 10/02/2013   Primary hypertension 08/24/2013   Idiopathic scoliosis 04/19/2013   Back pain 04/19/2013   Osteoporosis 04/19/2013   Edema 04/19/2013   Mild diastolic dysfunction 01/01/2013   Leg pain, bilateral 12/16/2012   Attention deficit disorder (ADD) in adult 05/24/2012   Involuntary movements 05/24/2012   DIZZINESS 12/31/2009   TREMOR, ESSENTIAL 12/12/2009   PALPITATIONS, RECURRENT 12/12/2009   OTHER VITAMIN B12 DEFICIENCY ANEMIA 10/30/2009   FEMALE STRESS INCONTINENCE 10/28/2009   MEMORY LOSS 10/28/2009   GERD 12/03/2008   ANEMIA, MILD 06/26/2008   DEPRESSION/ANXIETY 12/28/2007   PICA 12/28/2007   ACUTE BRONCHITIS 12/28/2007   FATIGUE 12/28/2007   Hyperlipidemia 05/15/2007   ARTIFICIAL MENOPAUSE 05/15/2007   Myalgia and myositis, unspecified 05/15/2007   Headache 05/15/2007   CHEST PAIN, ATYPICAL 05/15/2007   SYMPTOM, PAIN, ABDOMINAL, EPIGASTRIC 05/15/2007   Personal history presenting hazards to health 05/15/2007   PSTPRC STATUS, BARIATRIC SURGERY 05/15/2007   PCP:  Antonio Cyndee Jamee JONELLE, DO Pharmacy:   [facility] [id number] - [place], [place] - [address] [place] [address] Phone: [phone] Fax: [phone]     Social Drivers of Health (SDOH) Social History: SDOH Screenings   Food Insecurity: No Food Insecurity (10/16/2024)  Housing: Low Risk  (10/16/2024)  Transportation Needs: No Transportation Needs (10/16/2024)  Utilities: Not At Risk (10/16/2024)  Alcohol Screen:  Low Risk  (09/27/2022)  Depression (PHQ2-9): Low Risk  (09/25/2024)  Recent Concern: Depression (PHQ2-9) - High Risk (08/07/2024)  Financial Resource Strain: Low Risk (03/22/2024)   Received from [facility]  Physical Activity: Insufficiently Active (09/25/2024)  Social Connections: Moderately Integrated (10/16/2024)  Recent Concern: Social Connections - Moderately Isolated (09/25/2024)  Stress: No Stress Concern Present (09/25/2024)  Tobacco Use: Low Risk  (10/16/2024)  Health Literacy: Adequate Health Literacy (09/25/2024)   SDOH Interventions:     Readmission Risk Interventions     No data to display

## 2024-10-16 NOTE — Care Management Obs Status (Signed)
 MEDICARE OBSERVATION STATUS NOTIFICATION   Patient Details  Name: KINDRED HEYING MRN: [id number] Date of Birth: [birth year]   Medicare Observation Status Notification Given:  Yes    Tawni CHRISTELLA Eva, LCSW 10/16/2024, 4:03 PM

## 2024-10-17 DIAGNOSIS — R000 Tachycardia, unspecified: Secondary | ICD-10-CM | POA: Diagnosis not present

## 2024-10-17 LAB — CBC
HCT: 37.5 % (ref 36.0–46.0)
Hemoglobin: 12.6 g/dL (ref 12.0–15.0)
MCH: 34.1 pg — ABNORMAL HIGH (ref 26.0–34.0)
MCHC: 33.6 g/dL (ref 30.0–36.0)
MCV: 101.4 fL — ABNORMAL HIGH (ref 80.0–100.0)
Platelets: 206 K/uL (ref 150–400)
RBC: 3.7 MIL/uL — ABNORMAL LOW (ref 3.87–5.11)
RDW: 15.9 % — ABNORMAL HIGH (ref 11.5–15.5)
WBC: 5.7 K/uL (ref 4.0–10.5)
nRBC: 0 % (ref 0.0–0.2)

## 2024-10-17 LAB — LIPASE, BLOOD: Lipase: 61 U/L — ABNORMAL HIGH (ref 11–51)

## 2024-10-17 MED ORDER — LOSARTAN POTASSIUM 50 MG PO TABS
50.0000 mg | ORAL_TABLET | Freq: Every day | ORAL | Status: DC
  Administered 2024-10-17 – 2024-10-18 (×2): 50 mg via ORAL
  Filled 2024-10-17 (×2): qty 1

## 2024-10-17 MED ORDER — SODIUM CHLORIDE 0.9 % IV SOLN: INTRAVENOUS | Status: AC

## 2024-10-17 NOTE — Evaluation (Signed)
 Physical Therapy Evaluation Patient Details Name: [name] MRN: [id number] DOB: [birth year] Today's Date: 10/17/2024  History of Present Illness  78 yo female admitted with tachycardia, UTI, FTT, dehydration. Hx of spinal cord stimulator, scoliosis, CKD, tremor, depression, gastric bypass, chronic pain  Clinical Impression  On eval, pt was CGA for mobility. She was able to stand and perform stand pivot transfers x 2 with use of a RW. Pt tolerated activity well. HR 120s bpm, O2 90% on RA during session. Will plan to follow pt during this hospital stay. Discussed d/c plan-pt plans to return home with HHPT f/u.        If plan is discharge home, recommend the following: A little help with walking and/or transfers;A little help with bathing/dressing/bathroom;Assistance with cooking/housework;Assist for transportation;Help with stairs or ramp for entrance   Can travel by private vehicle        Equipment Recommendations None recommended by PT  Recommendations for Other Services       Functional Status Assessment Patient has had a recent decline in their functional status and demonstrates the ability to make significant improvements in function in a reasonable and predictable amount of time.     Precautions / Restrictions Precautions Precautions: Fall Restrictions Weight Bearing Restrictions Per Provider Order: No      Mobility  Bed Mobility Overal bed mobility: Needs Assistance Bed Mobility: Supine to Sit, Sit to Supine     Supine to sit: Modified independent (Device/Increase time), HOB elevated, Used rails Sit to supine: Modified independent (Device/Increase time), HOB elevated, Used rails        Transfers Overall transfer level: Needs assistance Equipment used: Rolling walker (2 wheels) Transfers: Sit to/from Stand, Bed to chair/wheelchair/BSC Sit to Stand: Contact guard assist Stand pivot transfers: Contact guard assist         General transfer comment:  Increased time. Cues for safety. Sit to stand x 2, Step pivot x 2 with RW. HR 120s, O2 90% on RA. Some difficulty repositioning feet during transfer.    Ambulation/Gait               General Gait Details: NT  Stairs            Wheelchair Mobility     Tilt Bed    Modified Rankin (Stroke Patients Only)       Balance Overall balance assessment: Needs assistance         Standing balance support: Bilateral upper extremity supported, During functional activity, Reliant on assistive device for balance Standing balance-Leahy Scale: Fair                               Pertinent Vitals/Pain Pain Assessment Pain Assessment: Faces Faces Pain Scale: No hurt    Home Living Family/patient expects to be discharged to:: Private residence Living Arrangements: Spouse/significant other Available Help at Discharge: Family Type of Home: House Home Access: Level entry         Home Equipment: Agricultural Consultant (2 wheels);Grab bars - tub/shower;Wheelchair - manual;Shower seat      Prior Function Prior Level of Function : Needs assist             Mobility Comments: Pt has mainly been WC level-supv level for transfers; takes a couple steps into the bathroom furniture walking. Sleeps in recliner. ADLs Comments: sponge bathing at sink; family assists with ADLs/IADLs as needed     Extremity/Trunk Assessment   Upper Extremity  Assessment Upper Extremity Assessment: Defer to OT evaluation    Lower Extremity Assessment Lower Extremity Assessment: Generalized weakness    Cervical / Trunk Assessment Cervical / Trunk Assessment: Kyphotic (scoliosis)  Communication   Communication Communication: No apparent difficulties Factors Affecting Communication: Hearing impaired    Cognition Arousal: Alert Behavior During Therapy: WFL for tasks assessed/performed                             Following commands: Intact       Cueing Cueing  Techniques: Verbal cues     General Comments      Exercises     Assessment/Plan    PT Assessment Patient needs continued PT services  PT Problem List Decreased strength;Decreased range of motion;Decreased activity tolerance;Decreased balance;Decreased mobility;Decreased knowledge of use of DME       PT Treatment Interventions DME instruction;Functional mobility training;Therapeutic activities;Therapeutic exercise;Patient/family education;Gait training;Balance training    PT Goals (Current goals can be found in the Care Plan section)  Acute Rehab PT Goals Patient Stated Goal: home once medically ready PT Goal Formulation: With patient Time For Goal Achievement: 10/31/24 Potential to Achieve Goals: Good    Frequency Min 3X/week     Co-evaluation               AM-PAC PT 6 Clicks Mobility  Outcome Measure Help needed turning from your back to your side while in a flat bed without using bedrails?: A Little Help needed moving from lying on your back to sitting on the side of a flat bed without using bedrails?: A Little Help needed moving to and from a bed to a chair (including a wheelchair)?: A Little Help needed standing up from a chair using your arms (e.g., wheelchair or bedside chair)?: A Little Help needed to walk in hospital room?: A Lot Help needed climbing 3-5 steps with a railing? : Total 6 Click Score: 15    End of Session   Activity Tolerance: Patient tolerated treatment well Patient left: in bed;with call bell/phone within reach;with bed alarm set   PT Visit Diagnosis: Difficulty in walking, not elsewhere classified (R26.2);Other abnormalities of gait and mobility (R26.89);Muscle weakness (generalized) (M62.81)    Time: 1206-1226 PT Time Calculation (min) (ACUTE ONLY): 20 min   Charges:   PT Evaluation $PT Eval Low Complexity: 1 Low   PT General Charges $$ ACUTE PT VISIT: 1 Visit           Dannial SQUIBB, PT Acute Rehabilitation  Office:  [phone]

## 2024-10-17 NOTE — Progress Notes (Signed)
 PROGRESS NOTE    [name]  FMW:[id number] DOB: 1946-09-04 DOA: 10/15/2024 PCP: Antonio Cyndee Jamee JONELLE, DO   Brief Narrative:  This 78 y.o. female with medical history significant for hypertension, tremors, depression, s/p Left Nephrectomy, CKD, and severe malnutrition.  She has history of gastric bypass in 2004 and spinal cord stimulator in 2016.  She has baseline significant rib to pelvis deformity and is forward flexed.  She follows with [facility] for this. Patient reports she has received Flu vaccine 2 months ago, Since then she has multiple ER visits for flu like symptoms.  She reports coming to the ED 5 days ago with similar symptoms.  She reports soreness in the throat, and does not feel good.  Her workup in the ED included CT abdomen and pelvis which was unremarkable.  Clinically patient looked quite dehydrated.  Patient was admitted for further evaluation and started on IV hydration.  Assessment & Plan:   Principal Problem:   Tachycardia Active Problems:   Protein-calorie malnutrition, severe   Deep vein thrombosis (DVT) of proximal vein of both lower extremities, unspecified chronicity (HCC)  Persistent dehydration: Failure to thrive: Patient presented with tachycardia, generalized  weakness , Appears dehydrated and has decreased oral intake post viral syndrome. She appears profoundly dehydrated. Continue IV fluid resuscitation.  Encourage oral intake. Speech and swallow evaluation completed, started on regular diet.. Her tachycardia has improved.  Chest x-ray unremarkable. UA shows UTI,  started on ceftriaxone . Urine culture were not sent.  Continue ceftriaxone  for 3 days. PT and OT evaluation  Chronic pain syndrome: Continue chronic narcotics.  History of multiple compression fractures: She is not on anything for osteoporosis. Follow up PCP for bisphosphonate.  Essential hypertension: Continue amlodipine  5 mg daily. Continue losartan  50  mg daily.  UTI: Started on ceftriaxone , urine cultures were not sent. Continue ceftriaxone  for 3 days.  CKD Serum creatinine at baseline. Avoid Nephrotoxic medications.  Elevated Lipase: She denies any abdominal pain. Recheck lipase.  DVT prophylaxis: Heparin  sq Code Status: Full code Family Communication: No family at bed side. Disposition Plan:    Status is: Observation The patient remains OBS appropriate and will d/c before 2 midnights.   Admitted for severe dehydration associated with failure to thrive.  She is also found to have UTI , started on ceftriaxone . PT and OT evaluation pending  Consultants:  None  Procedures: None  Antimicrobials:  Anti-infectives (From admission, onward)    Start     Dose/Rate Route Frequency Ordered Stop   10/16/24 1000  cefTRIAXone  (ROCEPHIN ) 1 g in sodium chloride  0.9 % 100 mL IVPB        1 g 200 mL/hr over 30 Minutes Intravenous Every 24 hours 10/16/24 0849        Subjective: Patient was seen and examined at bedside.  Overnight events noted. Patient reports feeling very weak tired and fatigued. Still reports having burning in the urination.  Objective: Vitals:   10/16/24 0900 10/16/24 1256 10/16/24 2108 10/17/24 0701  BP: (!) 149/86 (!) 158/92 117/74 (!) 173/96  Pulse: 97 99 96 93  Resp: 20 20 18 18   Temp: 99.2 F (37.3 C) 99.8 F (37.7 C) 99 F (37.2 C) 98.8 F (37.1 C)  TempSrc: Oral Oral Oral   SpO2: 95% 91% 93% 93%  Weight:      Height:        Intake/Output Summary (Last 24 hours) at 10/17/2024 1111 Last data filed at 10/17/2024 0800 Gross per 24  hour  Intake 460 ml  Output 500 ml  Net -40 ml   Filed Weights   10/16/24 0119  Weight: 61.4 kg    Examination:  General exam: Appears calm and comfortable, not in any acute distress.  Deconditioned. Respiratory system: CTA Bilaterally. Respiratory effort normal.  RR 14 Cardiovascular system: S1 & S2 heard, RRR. No JVD, murmurs, rubs, gallops or clicks.   Gastrointestinal system: Abdomen is nondistended, soft and nontender. Normal bowel sounds heard. Central nervous system: Alert and oriented x 3. No focal neurological deficits. Extremities: No edema , No cyanosis, no clubbing. Skin: No rashes, lesions or ulcers Psychiatry: Judgement and insight appear normal. Mood & affect appropriate.     Data Reviewed: I have personally reviewed following labs and imaging studies  CBC: Recent Labs  Lab 10/15/24 1800 10/16/24 0344 10/17/24 1003  WBC 8.6 5.7 5.7  HGB 14.9 10.7* 12.6  HCT 45.2 33.2* 37.5  MCV 104.4* 104.4* 101.4*  PLT 221 177 206   Basic Metabolic Panel: Recent Labs  Lab 10/15/24 1800 10/16/24 0344  NA 138 138  K 4.7 4.1  CL 105 110  CO2 22 22  GLUCOSE 88 99  BUN 27* 21  CREATININE 1.22* 1.02*  CALCIUM 9.2 8.3*  MG  --  2.2   GFR: Estimated Creatinine Clearance: 42.2 mL/min (A) (by C-G formula based on SCr of 1.02 mg/dL (H)). Liver Function Tests: Recent Labs  Lab 10/15/24 1800 10/16/24 0344  AST 18 13*  ALT <5 <5  ALKPHOS 161* 115  BILITOT 0.8 0.5  PROT 7.9 5.5*  ALBUMIN 4.1 2.9*   Recent Labs  Lab 10/15/24 1800  LIPASE 70*   No results for input(s): AMMONIA in the last 168 hours. Coagulation Profile: No results for input(s): INR, PROTIME in the last 168 hours. Cardiac Enzymes: No results for input(s): CKTOTAL, CKMB, CKMBINDEX, TROPONINI in the last 168 hours. BNP (last 3 results) No results for input(s): PROBNP in the last 8760 hours. HbA1C: No results for input(s): HGBA1C in the last 72 hours. CBG: No results for input(s): GLUCAP in the last 168 hours. Lipid Profile: No results for input(s): CHOL, HDL, LDLCALC, TRIG, CHOLHDL, LDLDIRECT in the last 72 hours. Thyroid Function Tests: Recent Labs    10/16/24 0344  TSH 1.260   Anemia Panel: No results for input(s): VITAMINB12, FOLATE, FERRITIN, TIBC, IRON, RETICCTPCT in the last 72 hours. Sepsis  Labs: Recent Labs  Lab 10/15/24 1809  LATICACIDVEN 0.9    Recent Results (from the past 240 hours)  Resp panel by RT-PCR (RSV, Flu A&B, Covid) Anterior Nasal Swab     Status: None   Collection Time: 10/10/24  7:33 AM   Specimen: Anterior Nasal Swab  Result Value Ref Range Status   SARS Coronavirus 2 by RT PCR NEGATIVE NEGATIVE Final    Comment: (NOTE) SARS-CoV-2 target nucleic acids are NOT DETECTED.  The SARS-CoV-2 RNA is generally detectable in upper respiratory specimens during the acute phase of infection. The lowest concentration of SARS-CoV-2 viral copies this assay can detect is 138 copies/mL. A negative result does not preclude SARS-Cov-2 infection and should not be used as the sole basis for treatment or other patient management decisions. A negative result may occur with  improper specimen collection/handling, submission of specimen other than nasopharyngeal swab, presence of viral mutation(s) within the areas targeted by this assay, and inadequate number of viral copies(<138 copies/mL). A negative result must be combined with clinical observations, patient history, and epidemiological information. The expected result  is Negative.  Fact Sheet for Patients:  bloggercourse.com  Fact Sheet for Healthcare Providers:  seriousbroker.it  This test is no t yet approved or cleared by the [place]  FDA and  has been authorized for detection and/or diagnosis of SARS-CoV-2 by FDA under an Emergency Use Authorization (EUA). This EUA will remain  in effect (meaning this test can be used) for the duration of the COVID-19 declaration under Section 564(b)(1) of the Act, 21 U.S.C.section 360bbb-3(b)(1), unless the authorization is terminated  or revoked sooner.       Influenza A by PCR NEGATIVE NEGATIVE Final   Influenza B by PCR NEGATIVE NEGATIVE Final    Comment: (NOTE) The Xpert Xpress SARS-CoV-2/FLU/RSV plus assay is  intended as an aid in the diagnosis of influenza from Nasopharyngeal swab specimens and should not be used as a sole basis for treatment. Nasal washings and aspirates are unacceptable for Xpert Xpress SARS-CoV-2/FLU/RSV testing.  Fact Sheet for Patients: bloggercourse.com  Fact Sheet for Healthcare Providers: seriousbroker.it  This test is not yet approved or cleared by the [place]  FDA and has been authorized for detection and/or diagnosis of SARS-CoV-2 by FDA under an Emergency Use Authorization (EUA). This EUA will remain in effect (meaning this test can be used) for the duration of the COVID-19 declaration under Section 564(b)(1) of the Act, 21 U.S.C. section 360bbb-3(b)(1), unless the authorization is terminated or revoked.     Resp Syncytial Virus by PCR NEGATIVE NEGATIVE Final    Comment: (NOTE) Fact Sheet for Patients: bloggercourse.com  Fact Sheet for Healthcare Providers: seriousbroker.it  This test is not yet approved or cleared by the [place]  FDA and has been authorized for detection and/or diagnosis of SARS-CoV-2 by FDA under an Emergency Use Authorization (EUA). This EUA will remain in effect (meaning this test can be used) for the duration of the COVID-19 declaration under Section 564(b)(1) of the Act, 21 U.S.C. section 360bbb-3(b)(1), unless the authorization is terminated or revoked.  Performed at [facility], [address] [place], [place] [address]     Radiology Studies: DG CHEST PORT 1 VIEW Result Date: 10/15/2024 CLINICAL DATA:  Failure to thrive EXAM: PORTABLE CHEST 1 VIEW COMPARISON:  08/23/2024 FINDINGS: Chronic elevation of right diaphragm. Ascending thoracic stimulator leads. No acute airspace disease. Stable cardiomediastinal silhouette with aortic atherosclerosis IMPRESSION: No active disease. Electronically Signed   By: Luke Bun M.D.   On: 10/15/2024 23:22   CT ABDOMEN PELVIS W CONTRAST Result Date: 10/15/2024 EXAM: CT ABDOMEN AND PELVIS WITH CONTRAST 10/15/2024 08:01:47 PM TECHNIQUE: CT of the abdomen and pelvis was performed with the administration of 80 mL of iohexol  (OMNIPAQUE ) 300 MG/ML solution. Multiplanar reformatted images are provided for review. Automated exposure control, iterative reconstruction, and/or weight-based adjustment of the mA/kV was utilized to reduce the radiation dose to as low as reasonably achievable. COMPARISON: CT abdomen and pelvis 02/01/2022. CLINICAL HISTORY: Abdominal pain, acute, nonlocalized. FINDINGS: LOWER CHEST: Bibasilar atelectasis, right middle lobe bronchiectasis. Question Partially visualized right hilar lymph nodes. LIVER: The liver is unremarkable. GALLBLADDER AND BILE DUCTS: Gallbladder is unremarkable. No biliary ductal dilatation. SPLEEN: No acute abnormality. PANCREAS: No acute abnormality. ADRENAL GLANDS: No acute abnormality. KIDNEYS, URETERS AND BLADDER: Status post left nephrectomy. No stones in the right kidney or ureter. No right hydronephrosis. No perinephric or periureteral stranding. Urinary bladder is unremarkable. No filling defects of the partially visualized right collecting system on delayed imaging. GI AND BOWEL: Roux-en-y gastric bypass surgical changes noted. No small  or large bowel thickening or dilatation. The appendix is not definitely identified with no inflammatory changes in the right lower quadrant to suggest acute appendicitis. PERITONEUM AND RETROPERITONEUM: No ascites. No free air. VASCULATURE: Moderate atherosclerotic plaque. Aorta is normal in caliber. LYMPH NODES: No lymphadenopathy. REPRODUCTIVE ORGANS: No acute abnormality. BONES AND SOFT TISSUES: Diffusely decreased bone density. Multilevel chronic vertebral body height loss with kyphoplasty at the L1 level. Intramedullary nail fixation of the right femur partially visualized. Old healed right  pelvic fractures. No focal soft tissue abnormality. IMPRESSION: 1. No acute findings in the abdomen or pelvis. 2. Osteoporosis with multilevel chronic vertebral body height loss with kyphoplasty at the L1 level. 3. Left nephrectomy. Electronically signed by: Morgane Naveau MD 10/15/2024 08:13 PM EST RP Workstation: [id number]   Scheduled Meds:  amLODipine   5 mg Oral Daily   brexpiprazole   1 mg Oral Daily   heparin   5,000 Units Subcutaneous Q8H   losartan   50 mg Oral Daily   venlafaxine  XR  150 mg Oral Daily   Continuous Infusions:  cefTRIAXone  (ROCEPHIN )  IV 1 g (10/17/24 0811)     LOS: 0 days    Time spent: 35 mins    Darcel Dawley, MD [facility]   If 7PM-7AM, please contact night-coverage

## 2024-10-18 ENCOUNTER — Other Ambulatory Visit (HOSPITAL_COMMUNITY): Payer: Self-pay

## 2024-10-18 DIAGNOSIS — R000 Tachycardia, unspecified: Secondary | ICD-10-CM | POA: Diagnosis not present

## 2024-10-18 LAB — CBC
HCT: 37.4 % (ref 36.0–46.0)
Hemoglobin: 12.4 g/dL (ref 12.0–15.0)
MCH: 33.4 pg (ref 26.0–34.0)
MCHC: 33.2 g/dL (ref 30.0–36.0)
MCV: 100.8 fL — ABNORMAL HIGH (ref 80.0–100.0)
Platelets: 193 K/uL (ref 150–400)
RBC: 3.71 MIL/uL — ABNORMAL LOW (ref 3.87–5.11)
RDW: 15.5 % (ref 11.5–15.5)
WBC: 6.2 K/uL (ref 4.0–10.5)
nRBC: 0 % (ref 0.0–0.2)

## 2024-10-18 LAB — BASIC METABOLIC PANEL WITH GFR
Anion gap: 9 (ref 5–15)
BUN: 10 mg/dL (ref 8–23)
CO2: 27 mmol/L (ref 22–32)
Calcium: 8.9 mg/dL (ref 8.9–10.3)
Chloride: 99 mmol/L (ref 98–111)
Creatinine, Ser: 0.75 mg/dL (ref 0.44–1.00)
GFR, Estimated: >60 mL/min (ref >60)
Glucose, Bld: 104 mg/dL — ABNORMAL HIGH (ref 70–99)
Potassium: 4.1 mmol/L (ref 3.5–5.1)
Sodium: 135 mmol/L (ref 135–145)

## 2024-10-18 MED ORDER — ENSURE PLUS HIGH PROTEIN PO LIQD
237.0000 mL | Freq: Two times a day (BID) | ORAL | Status: DC
  Administered 2024-10-18: 237 mL via ORAL

## 2024-10-18 MED ORDER — SUMATRIPTAN SUCCINATE 50 MG PO TABS
50.0000 mg | ORAL_TABLET | Freq: Once | ORAL | Status: AC
  Administered 2024-10-18: 50 mg via ORAL
  Filled 2024-10-18: qty 1

## 2024-10-18 NOTE — Discharge Summary (Signed)
 [facility]  Physician Discharge Summary   Patient ID: [name] MRN: [id number] DOB/AGE: 1946/06/01 78 y.o.  Admit date: 10/15/2024 Discharge date: 10/18/2024    PCP: Antonio Cyndee Jamee JONELLE, DO  DISCHARGE DIAGNOSES:  Dehydration secondary to recent viral illness  Tachycardia, resolved   Deep vein thrombosis (DVT) of proximal vein of both lower extremities, unspecified chronicity (HCC) Chronic pain syndrome History of multiple compression fractures Essential hypertension    RECOMMENDATIONS FOR OUTPATIENT FOLLOW UP: Outpatient follow-up with PCP for further evaluation   Home Health: PT Equipment/Devices: None  CODE STATUS: Full code  DISCHARGE CONDITION: fair  Diet recommendation: As before  INITIAL HISTORY: 78 y.o. female with medical history significant for hypertension, tremors, depression, s/p Left Nephrectomy, CKD, and severe malnutrition.  She has history of gastric bypass in 2004 and spinal cord stimulator in 2016.  She has baseline significant rib to pelvis deformity and is forward flexed.  She follows with [facility] for this. Patient reports she has received Flu vaccine 2 months ago, Since then she has multiple ER visits for flu like symptoms.  She reports coming to the ED 5 days ago with similar symptoms.  She reports soreness in the throat, and does not feel good.  Her workup in the ED included CT abdomen and pelvis which was unremarkable.  Clinically patient looked quite dehydrated.  Patient was admitted for further evaluation and started on IV hydration.    HOSPITAL COURSE:   Persistent dehydration: Failure to thrive: Patient presented with tachycardia, generalized  weakness , Appears dehydrated and has decreased oral intake post viral syndrome. Was given IV fluids with improvement in her hydration status and tachycardia.  Ambulated with physical therapy.  Home health has been ordered.  Tolerating her diet.  Okay for  discharge home today.   Chronic pain syndrome: Continue chronic narcotics.   History of multiple compression fractures: She is not on anything for osteoporosis. Follow up PCP for bisphosphonate.   Essential hypertension: Continue home medications   UTI: Started on ceftriaxone , urine cultures were not sent. Received ceftriaxone  for 3 days.   Acute kidney injury Creatinine back to baseline   Elevated Lipase: She denies any abdominal pain.  Etiology unclear.  CT scan did not show any pancreatic abnormalities.  Outpatient monitoring.  Patient is stable.  Feels better.  Okay for discharge home today.   PERTINENT LABS:  The results of significant diagnostics from this hospitalization (including imaging, microbiology, ancillary and laboratory) are listed below for reference.    Microbiology: Recent Results (from the past 240 hours)  Resp panel by RT-PCR (RSV, Flu A&B, Covid) Anterior Nasal Swab     Status: None   Collection Time: 10/10/24  7:33 AM   Specimen: Anterior Nasal Swab  Result Value Ref Range Status   SARS Coronavirus 2 by RT PCR NEGATIVE NEGATIVE Final    Comment: (NOTE) SARS-CoV-2 target nucleic acids are NOT DETECTED.  The SARS-CoV-2 RNA is generally detectable in upper respiratory specimens during the acute phase of infection. The lowest concentration of SARS-CoV-2 viral copies this assay can detect is 138 copies/mL. A negative result does not preclude SARS-Cov-2 infection and should not be used as the sole basis for treatment or other patient management decisions. A negative result may occur with  improper specimen collection/handling, submission of specimen other than nasopharyngeal swab, presence of viral mutation(s) within the areas targeted by this assay, and inadequate number of viral copies(<138 copies/mL). A negative result must be combined  with clinical observations, patient history, and epidemiological information. The expected result is  Negative.  Fact Sheet for Patients:  bloggercourse.com  Fact Sheet for Healthcare Providers:  seriousbroker.it  This test is no t yet approved or cleared by the [place]  FDA and  has been authorized for detection and/or diagnosis of SARS-CoV-2 by FDA under an Emergency Use Authorization (EUA). This EUA will remain  in effect (meaning this test can be used) for the duration of the COVID-19 declaration under Section 564(b)(1) of the Act, 21 U.S.C.section 360bbb-3(b)(1), unless the authorization is terminated  or revoked sooner.       Influenza A by PCR NEGATIVE NEGATIVE Final   Influenza B by PCR NEGATIVE NEGATIVE Final    Comment: (NOTE) The Xpert Xpress SARS-CoV-2/FLU/RSV plus assay is intended as an aid in the diagnosis of influenza from Nasopharyngeal swab specimens and should not be used as a sole basis for treatment. Nasal washings and aspirates are unacceptable for Xpert Xpress SARS-CoV-2/FLU/RSV testing.  Fact Sheet for Patients: bloggercourse.com  Fact Sheet for Healthcare Providers: seriousbroker.it  This test is not yet approved or cleared by the [place]  FDA and has been authorized for detection and/or diagnosis of SARS-CoV-2 by FDA under an Emergency Use Authorization (EUA). This EUA will remain in effect (meaning this test can be used) for the duration of the COVID-19 declaration under Section 564(b)(1) of the Act, 21 U.S.C. section 360bbb-3(b)(1), unless the authorization is terminated or revoked.     Resp Syncytial Virus by PCR NEGATIVE NEGATIVE Final    Comment: (NOTE) Fact Sheet for Patients: bloggercourse.com  Fact Sheet for Healthcare Providers: seriousbroker.it  This test is not yet approved or cleared by the [place]  FDA and has been authorized for detection and/or diagnosis of  SARS-CoV-2 by FDA under an Emergency Use Authorization (EUA). This EUA will remain in effect (meaning this test can be used) for the duration of the COVID-19 declaration under Section 564(b)(1) of the Act, 21 U.S.C. section 360bbb-3(b)(1), unless the authorization is terminated or revoked.  Performed at [facility], [address] [place], [place] [address]      Labs:   Basic Metabolic Panel: Recent Labs  Lab 10/15/24 1800 10/16/24 0344 10/18/24 0351  NA 138 138 135  K 4.7 4.1 4.1  CL 105 110 99  CO2 22 22 27   GLUCOSE 88 99 104*  BUN 27* 21 10  CREATININE 1.22* 1.02* 0.75  CALCIUM 9.2 8.3* 8.9  MG  --  2.2  --    Liver Function Tests: Recent Labs  Lab 10/15/24 1800 10/16/24 0344  AST 18 13*  ALT <5 <5  ALKPHOS 161* 115  BILITOT 0.8 0.5  PROT 7.9 5.5*  ALBUMIN 4.1 2.9*   Recent Labs  Lab 10/15/24 1800 10/17/24 1003  LIPASE 70* 61*    CBC: Recent Labs  Lab 10/15/24 1800 10/16/24 0344 10/17/24 1003 10/18/24 0351  WBC 8.6 5.7 5.7 6.2  HGB 14.9 10.7* 12.6 12.4  HCT 45.2 33.2* 37.5 37.4  MCV 104.4* 104.4* 101.4* 100.8*  PLT 221 177 206 193    IMAGING STUDIES DG CHEST PORT 1 VIEW Result Date: 10/15/2024 CLINICAL DATA:  Failure to thrive EXAM: PORTABLE CHEST 1 VIEW COMPARISON:  08/23/2024 FINDINGS: Chronic elevation of right diaphragm. Ascending thoracic stimulator leads. No acute airspace disease. Stable cardiomediastinal silhouette with aortic atherosclerosis IMPRESSION: No active disease. Electronically Signed   By: Luke Bun M.D.   On: 10/15/2024 23:22   CT ABDOMEN  PELVIS W CONTRAST Result Date: 10/15/2024 EXAM: CT ABDOMEN AND PELVIS WITH CONTRAST 10/15/2024 08:01:47 PM TECHNIQUE: CT of the abdomen and pelvis was performed with the administration of 80 mL of iohexol  (OMNIPAQUE ) 300 MG/ML solution. Multiplanar reformatted images are provided for review. Automated exposure control, iterative reconstruction, and/or weight-based adjustment  of the mA/kV was utilized to reduce the radiation dose to as low as reasonably achievable. COMPARISON: CT abdomen and pelvis 02/01/2022. CLINICAL HISTORY: Abdominal pain, acute, nonlocalized. FINDINGS: LOWER CHEST: Bibasilar atelectasis, right middle lobe bronchiectasis. Question Partially visualized right hilar lymph nodes. LIVER: The liver is unremarkable. GALLBLADDER AND BILE DUCTS: Gallbladder is unremarkable. No biliary ductal dilatation. SPLEEN: No acute abnormality. PANCREAS: No acute abnormality. ADRENAL GLANDS: No acute abnormality. KIDNEYS, URETERS AND BLADDER: Status post left nephrectomy. No stones in the right kidney or ureter. No right hydronephrosis. No perinephric or periureteral stranding. Urinary bladder is unremarkable. No filling defects of the partially visualized right collecting system on delayed imaging. GI AND BOWEL: Roux-en-y gastric bypass surgical changes noted. No small or large bowel thickening or dilatation. The appendix is not definitely identified with no inflammatory changes in the right lower quadrant to suggest acute appendicitis. PERITONEUM AND RETROPERITONEUM: No ascites. No free air. VASCULATURE: Moderate atherosclerotic plaque. Aorta is normal in caliber. LYMPH NODES: No lymphadenopathy. REPRODUCTIVE ORGANS: No acute abnormality. BONES AND SOFT TISSUES: Diffusely decreased bone density. Multilevel chronic vertebral body height loss with kyphoplasty at the L1 level. Intramedullary nail fixation of the right femur partially visualized. Old healed right pelvic fractures. No focal soft tissue abnormality. IMPRESSION: 1. No acute findings in the abdomen or pelvis. 2. Osteoporosis with multilevel chronic vertebral body height loss with kyphoplasty at the L1 level. 3. Left nephrectomy. Electronically signed by: Morgane Naveau MD 10/15/2024 08:13 PM EST RP Workstation: [id number]   CT ABDOMEN PELVIS W CONTRAST Result Date: 10/10/2024 EXAM: CT ABDOMEN AND PELVIS WITH CONTRAST  10/10/2024 09:21:07 AM TECHNIQUE: CT of the abdomen and pelvis was performed with the administration of 100 mL of iohexol  (OMNIPAQUE ) 300 MG/ML solution. Multiplanar reformatted images are provided for review. Automated exposure control, iterative reconstruction, and/or weight-based adjustment of the mA/kV was utilized to reduce the radiation dose to as low as reasonably achievable. COMPARISON: CT 06/05/2024, 03/11/2023 and 03/06/2021. CLINICAL HISTORY: LLQ abdominal pain; abd pain n/v/d FINDINGS: LOWER CHEST: Minimal calcified plaque of the descending thoracic aorta. Visualized lung bases demonstrate stable bibasilar scarring with mild bronchiectatic change over the right middle lobe. There is minimal scarring over the inferior aspect of the right upper lobe at the junction of the minor fissure with 7 mm nodular component which is indeterminate. LIVER: The liver is unremarkable. GALLBLADDER AND BILE DUCTS: Gallbladder is unremarkable. No biliary ductal dilatation. SPLEEN: No acute abnormality. PANCREAS: No acute abnormality. ADRENAL GLANDS: No acute abnormality. KIDNEYS, URETERS AND BLADDER: Evidence of previous left nephrectomy. Right kidney is normal in size without hydronephrosis or nephrolithiasis. Left renal fossa is unremarkable. No stones in the kidneys or ureters. No hydronephrosis. No perinephric or periureteral stranding. Urinary bladder is unremarkable. GI AND BOWEL: Suture sutures over the stomach likely representing previous bypass surgery. Small bowel is unremarkable. Appendix not visualized. Surgical switch line over a bowel loop in the left mid abdomen. Mild submucosal edema of the sigmoid colon which may be due to an acute colitis. There is no bowel obstruction. PERITONEUM AND RETROPERITONEUM: No ascites. No free air. VASCULATURE: Mucosal plaque of the abdominal aorta which is normal in caliber. Remaining vascular structures are unremarkable. LYMPH  NODES: No lymphadenopathy. REPRODUCTIVE ORGANS: No  acute abnormality. BONES AND SOFT TISSUES: Stable severe compression fractures of T12 through L4 with previous kyphoplasty of L1. No acute osseous abnormality. No focal soft tissue abnormality. IMPRESSION: 1. Mild submucosal edema of the sigmoid colon, possibly representing acute colitis. 2. Indeterminate 7 mm nodule right upper lobe at the junction of the right minor fissure with adjacent scarring; recommend non-contrast chest CT at 612 months, then consider 1824 months, per Fleischner Society Guidelines. 3. Stable severe compression fractures of T12 through L4 with prior L1 kyphoplasty. 4. Multiple postsurgical changes as described, including left nephrectomy and gastric bypass . Electronically signed by: Toribio Agreste MD 10/10/2024 09:57 AM EST RP Workstation: [id number]    DISCHARGE EXAMINATION: Vitals:   10/18/24 0349 10/18/24 0718 10/18/24 0719 10/18/24 0918  BP: (!) 170/102 (!) 161/95 (!) 163/98 (!) 163/98  Pulse: (!) 102     Resp: 19 20 19    Temp: 98 F (36.7 C)     TempSrc: Oral     SpO2: 93%     Weight:      Height:       General appearance: Awake alert.  In no distress Resp: Clear to auscultation bilaterally.  Normal effort Cardio: S1-S2 is normal regular.  No S3-S4.  No rubs murmurs or bruit GI: Abdomen is soft.  Nontender nondistended.  Bowel sounds are present normal.  No masses organomegaly Extremities: No edema.  Full range of motion of lower extremities. Neurologic: Alert and oriented x3.  No focal neurological deficits.    DISPOSITION: Home  Discharge Instructions     Call MD for:  difficulty breathing, headache or visual disturbances   Complete by: As directed    Call MD for:  extreme fatigue   Complete by: As directed    Call MD for:  persistant dizziness or light-headedness   Complete by: As directed    Call MD for:  persistant nausea and vomiting   Complete by: As directed    Call MD for:  severe uncontrolled pain   Complete by: As directed    Call MD for:   temperature >100.4   Complete by: As directed    Diet - low sodium heart healthy   Complete by: As directed    Discharge instructions   Complete by: As directed    Please take your medications as prescribed.  Home health has been ordered for you.  You were cared for by a hospitalist during your hospital stay. If you have any questions about your discharge medications or the care you received while you were in the hospital after you are discharged, you can call the unit and asked to speak with the hospitalist on call if the hospitalist that took care of you is not available. Once you are discharged, your primary care physician will handle any further medical issues. Please note that NO REFILLS for any discharge medications will be authorized once you are discharged, as it is imperative that you return to your primary care physician (or establish a relationship with a primary care physician if you do not have one) for your aftercare needs so that they can reassess your need for medications and monitor your lab values. If you do not have a primary care physician, you can call [phone] for a physician referral.   Discharge wound care:   Complete by: As directed    Wound care  Daily      Comments: Cleanse sacral/buttocks wounds with Vashe, do not rinse.  Apply Xeroform gauze ([facility] [phone]) to wound beds daily and secure with silicone foam   Increase activity slowly   Complete by: As directed         Allergies as of 10/18/2024       Reactions   Zanaflex  [tizanidine ] Other (See Comments)   Hallucinations only with higher doses- otherwise, it is tolerated   Adhesive [tape] Itching        Medication List     STOP taking these medications    amphetamine -dextroamphetamine  20 MG 24 hr capsule Commonly known as: ADDERALL  XR   brexpiprazole  1 MG Tabs tablet Commonly known as: Rexulti    ondansetron  4 MG tablet Commonly known as: ZOFRAN        TAKE these medications    amLODipine  5 MG  tablet Commonly known as: NORVASC  1 po bid What changed:  how much to take how to take this when to take this additional instructions   clonazePAM  0.5 MG tablet Commonly known as: KLONOPIN  Take 1 tablet (0.5 mg total) by mouth 3 (three) times daily as needed. What changed:  how much to take reasons to take this   loperamide  2 MG capsule Commonly known as: IMODIUM  TAKE 1 CAPSULE BY MOUTH DAILY AS NEEDED FOR DIARRHEA OR LOOSE STOOLS What changed: See the new instructions.   losartan  50 MG tablet Commonly known as: COZAAR  TAKE 1 TABLET BY MOUTH DAILY What changed: Another medication with the same name was removed. Continue taking this medication, and follow the directions you see here.   ondansetron  8 MG disintegrating tablet Commonly known as: ZOFRAN -ODT Take 1 tablet (8 mg total) by mouth every 8 (eight) hours as needed for nausea or vomiting. What changed: reasons to take this   oxyCODONE  15 MG immediate release tablet Commonly known as: ROXICODONE  Take 1 tablet (15 mg total) by mouth 4 (four) times daily. What changed:  when to take this additional instructions   SUMAtriptan  50 MG tablet Commonly known as: IMITREX  TAKE 1 TABLET BY MOUTH AT ONSET OF MIGRAINE; MAY REPEAT 1 TABLET IN 2 HOURS IF NEEDED. What changed: See the new instructions.   tiZANidine  4 MG tablet Commonly known as: ZANAFLEX  Take 4 mg by mouth at bedtime.   traMADol  50 MG tablet Commonly known as: ULTRAM  Take 50 mg by mouth 2 (two) times daily as needed (for pain).   TYLENOL  500 MG tablet Generic drug: acetaminophen  Take 500-1,000 mg by mouth every 6 (six) hours as needed for mild pain (pain score 1-3) (or headaches).   venlafaxine  XR 75 MG 24 hr capsule Commonly known as: EFFEXOR -XR Take 2 capsules (150 mg total) by mouth daily.               Discharge Care Instructions  (From admission, onward)           Start     Ordered   10/18/24 0000  Discharge wound care:        Comments: Wound care  Daily      Comments: Cleanse sacral/buttocks wounds with Vashe, do not rinse. Apply Xeroform gauze ([facility]son [phone]) to wound beds daily and secure with silicone foam   10/18/24 0954              Contact information for follow-up providers     Antonio Meth, Jamee SAUNDERS, DO. Schedule an appointment as soon as possible for a visit in 1 week(s).   Specialty: Family Medicine Why: post hospitalization follow up Contact information: [address]  [place] [address] [phone]              Contact information for after-discharge care     Home Medical Care     [facility]) .   Service: Home Health Services Contact information: [phone] [employer]  [phone]                     TOTAL DISCHARGE TIME: 35 minutes  Jahmez Bily  [facility] Pager on [email or web address]  10/19/2024, 11:46 AM

## 2024-10-18 NOTE — TOC Transition Note (Signed)
 Transition of Care [facility]) - Discharge Note   Patient Details  Name: [name] MRN: [id number] Date of Birth: 21-May-1946  [facility] CM/SW Contact:  Tawni CHRISTELLA Eva, LCSW Phone Number: 10/18/2024, 11:32 AM   Clinical Narrative:     CSW spoke with the patients spouse, Norleen, to discuss home health recommendations. The patients spouse is agreeable and would like a list of home health agencies emailed to him. CSW inquired if he would be providing transportation home. He stated to call the patients daughter, Corean. CSW then spoke with the patients daughter, who reported that she will pick the patient up upon discharge.  CSW also spoke with the patients daughter, Corean, who selected [facility] PT/OT/RN services. CSW discussed the recommendation for a bedside commode; the patients daughter declined and stated she will contact the patients MD if she believes it will be needed. No further ICM needs. ICM signing off. Final next level of care: Home w Home Health Services Barriers to Discharge: Barriers Resolved   Patient Goals and CMS Choice Patient states their goals for this hospitalization and ongoing recovery are:: return home with home health CMS Medicare.gov Compare Post Acute Care list provided to:: Patient Represenative (must comment) Choice offered to / list presented to : Adult Children, Spouse      Discharge Placement                    Patient and family notified of of transfer: 10/18/24  Discharge Plan and Services Additional resources added to the After Visit Summary for                                       Social Drivers of Health (SDOH) Interventions SDOH Screenings   Food Insecurity: No Food Insecurity (10/16/2024)  Housing: Low Risk (10/16/2024)  Transportation Needs: No Transportation Needs (10/16/2024)  Utilities: Not At Risk (10/16/2024)  Alcohol Screen: Low Risk (09/27/2022)  Depression (PHQ2-9): Low Risk  (09/25/2024)  Recent Concern: Depression (PHQ2-9) - High Risk (08/07/2024)  Financial Resource Strain: Low Risk (03/22/2024)   Received from [facility]  Physical Activity: Insufficiently Active (09/25/2024)  Social Connections: Moderately Integrated (10/16/2024)  Recent Concern: Social Connections - Moderately Isolated (09/25/2024)  Stress: No Stress Concern Present (09/25/2024)  Tobacco Use: Low Risk (10/16/2024)  Health Literacy: Adequate Health Literacy (09/25/2024)     Readmission Risk Interventions     No data to display

## 2024-10-22 ENCOUNTER — Encounter: Payer: Self-pay | Admitting: Family Medicine

## 2024-10-22 ENCOUNTER — Inpatient Hospital Stay: Admitting: Medical

## 2024-10-22 ENCOUNTER — Ambulatory Visit: Admitting: Family Medicine

## 2024-10-22 ENCOUNTER — Inpatient Hospital Stay: Admitting: Family Medicine

## 2024-10-22 VITALS — BP 128/90 | HR 68 | Temp 98.2°F | Resp 16 | Ht 65.0 in

## 2024-10-22 DIAGNOSIS — R519 Headache, unspecified: Secondary | ICD-10-CM

## 2024-10-22 DIAGNOSIS — F419 Anxiety disorder, unspecified: Secondary | ICD-10-CM

## 2024-10-22 DIAGNOSIS — I10 Essential (primary) hypertension: Secondary | ICD-10-CM | POA: Diagnosis not present

## 2024-10-22 DIAGNOSIS — Z87440 Personal history of urinary (tract) infections: Secondary | ICD-10-CM

## 2024-10-22 DIAGNOSIS — E2839 Other primary ovarian failure: Secondary | ICD-10-CM | POA: Diagnosis not present

## 2024-10-22 DIAGNOSIS — E782 Mixed hyperlipidemia: Secondary | ICD-10-CM

## 2024-10-22 LAB — CBC WITH DIFFERENTIAL/PLATELET
Basophils Absolute: 0 K/uL (ref 0.0–0.1)
Basophils Relative: 0.7 % (ref 0.0–3.0)
Eosinophils Absolute: 0.3 K/uL (ref 0.0–0.7)
Eosinophils Relative: 4.7 % (ref 0.0–5.0)
HCT: 38.9 % (ref 36.0–46.0)
Hemoglobin: 12.7 g/dL (ref 12.0–15.0)
Lymphocytes Relative: 22 % (ref 12.0–46.0)
Lymphs Abs: 1.2 K/uL (ref 0.7–4.0)
MCHC: 32.6 g/dL (ref 30.0–36.0)
MCV: 104 fl — ABNORMAL HIGH (ref 78.0–100.0)
Monocytes Absolute: 0.5 K/uL (ref 0.1–1.0)
Monocytes Relative: 9.3 % (ref 3.0–12.0)
Neutro Abs: 3.4 K/uL (ref 1.4–7.7)
Neutrophils Relative %: 63.3 % (ref 43.0–77.0)
Platelets: 273 K/uL (ref 150.0–400.0)
RBC: 3.74 Mil/uL — ABNORMAL LOW (ref 3.87–5.11)
RDW: 17.1 % — ABNORMAL HIGH (ref 11.5–15.5)
WBC: 5.3 K/uL (ref 4.0–10.5)

## 2024-10-22 LAB — COMPREHENSIVE METABOLIC PANEL WITH GFR
ALT: 6 U/L (ref 0–35)
AST: 16 U/L (ref 0–37)
Albumin: 3.9 g/dL (ref 3.5–5.2)
Alkaline Phosphatase: 124 U/L — ABNORMAL HIGH (ref 39–117)
BUN: 23 mg/dL (ref 6–23)
CO2: 28 meq/L (ref 19–32)
Calcium: 9.1 mg/dL (ref 8.4–10.5)
Chloride: 102 meq/L (ref 96–112)
Creatinine, Ser: 1.29 mg/dL — ABNORMAL HIGH (ref 0.40–1.20)
GFR: 40.25 mL/min — ABNORMAL LOW (ref >60.00)
Glucose, Bld: 73 mg/dL (ref 70–99)
Potassium: 3.6 meq/L (ref 3.5–5.1)
Sodium: 138 meq/L (ref 135–145)
Total Bilirubin: 0.5 mg/dL (ref 0.2–1.2)
Total Protein: 7.1 g/dL (ref 6.0–8.3)

## 2024-10-22 LAB — POC URINALSYSI DIPSTICK (AUTOMATED)
Blood, UA: NEGATIVE
Glucose, UA: NEGATIVE
Ketones, UA: NEGATIVE
Leukocytes, UA: NEGATIVE
Nitrite, UA: NEGATIVE
Protein, UA: POSITIVE — AB
Spec Grav, UA: 1.015 (ref 1.010–1.025)
Urobilinogen, UA: 0.2 U/dL
pH, UA: 6 (ref 5.0–8.0)

## 2024-10-22 MED ORDER — AMLODIPINE BESYLATE 5 MG PO TABS: ORAL_TABLET | ORAL | 1 refills | Status: AC

## 2024-10-22 MED ORDER — CLONAZEPAM 0.5 MG PO TABS: 0.5000 mg | ORAL_TABLET | Freq: Three times a day (TID) | ORAL | 1 refills | Status: AC | PRN

## 2024-10-22 MED ORDER — SUMATRIPTAN SUCCINATE 50 MG PO TABS: ORAL_TABLET | ORAL | 2 refills | Status: AC

## 2024-10-22 NOTE — Assessment & Plan Note (Signed)
 Well controlled, no changes to meds. Encouraged heart healthy diet such as the DASH diet and exercise as tolerated.

## 2024-10-22 NOTE — Assessment & Plan Note (Signed)
 stable

## 2024-10-22 NOTE — Assessment & Plan Note (Signed)
 Encourage heart healthy diet such as MIND or DASH diet, increase exercise, avoid trans fats, simple carbohydrates and processed foods, consider a krill or fish or flaxseed oil cap daily.

## 2024-10-22 NOTE — Progress Notes (Unsigned)
 Established Patient Office Visit  Subjective   Patient ID: [name], female    DOB: [birth year]  Age: 78 y.o. MRN: [id number]  Chief Complaint  Patient presents with   Hospitalization Follow-up    HPI Discussed the use of AI scribe software for clinical note transcription with the patient, who gave verbal consent to proceed.  History of Present Illness [name] [name] or [name] is a 78 year old female who presents for follow-up after a recent hospitalization for a massive kidney infection.  She was hospitalized at [facility] for three days due to a massive kidney infection. Initially, she was seen in the emergency room two to three weeks ago, where a CT scan was performed, and she was sent home with an endoscopy. However, her condition did not improve, and she was later diagnosed with the kidney infection. During her hospital stay, she received intravenous antibiotics and blood thinners but was not discharged with any medications.  She experienced severe symptoms including vomiting, diarrhea, and significant weakness, to the point where she could not stand and required assistance to move. No burning during urination was noted prior to hospitalization, but she did experience some burning while in the hospital. She also had swelling in her legs and has since discontinued Rexulti .  Her medical history includes high blood pressure, and she was advised to stop taking amlodipine , Adderall , and Rexulti  due to their potential impact on her blood pressure. She manages her sleep issues by taking a muscle relaxer and Tylenol  instead of Klonopin , which she previously used at night.  She reports a sore suprapubic area where her catheter is placed, which is red, itchy, and painful. She has been using cortisone cream to manage the symptoms, suspecting a reaction to the catheter or its tape. Urine leakage from her urethra might be contributing to the irritation.  Her discharge papers did not  mention the kidney infection, only her high blood pressure, which concerns her due to the lack of information regarding her infection in the discharge summary.   Patient Active Problem List   Diagnosis Date Noted   Tachycardia 10/15/2024   Tremor 09/03/2024   Anxiety 09/03/2024   Opioid dependence with withdrawal (HCC) 05/20/2023   Deep vein thrombosis (DVT) of proximal vein of both lower extremities, unspecified chronicity (HCC) 05/20/2023   Elevated BP without diagnosis of hypertension 11/12/2021   Dysuria 10/16/2019   Suspected COVID-19 virus infection 10/16/2019   Closed nondisplaced intertrochanteric fracture of right femur (HCC) 05/31/2019   Closed displaced comminuted supracondylar fracture of left humerus without intercondylar fracture 04/17/2019   Nonfunctioning kidney 12/25/2018   Hydronephrosis with ureteral stricture, not elsewhere classified 12/21/2018   Malnutrition of moderate degree 12/08/2018   Delirium 12/07/2018   Hydronephrosis of left kidney 12/07/2018   Pyelonephritis 12/07/2018   Protein-calorie malnutrition, severe 08/24/2018   Pressure injury of skin 08/22/2018   Obstruction of left ureteropelvic junction (UPJ) due to stone 08/21/2018   Hypoalbuminemia 08/21/2018   Chronic pain disorder 08/21/2018   Thrombocytopenia 08/21/2018   Acute renal failure (ARF) 08/21/2018   Compression fracture of spine (HCC) 08/21/2018   Acute respiratory failure with hypoxemia (HCC)    Hematoma 06/03/2018   Dysphagia 02/07/2018   Generalized anxiety disorder 07/04/2017   Nonintractable headache 07/04/2017   Exertional dyspnea 08/05/2016   Sepsis (HCC) 03/07/2015   Hypokalemia 03/07/2015   Acute encephalopathy 03/06/2015   Fever 03/06/2015   Chronic back pain 01/15/2015   Obesity (BMI 30-39.9) 10/15/2013  Pulmonary nodules 10/15/2013   CAP (community acquired pneumonia) 10/02/2013   Primary hypertension 08/24/2013   Idiopathic scoliosis 04/19/2013   Back pain  04/19/2013   Osteoporosis 04/19/2013   Edema 04/19/2013   Mild diastolic dysfunction 01/01/2013   Leg pain, bilateral 12/16/2012   Attention deficit disorder (ADD) in adult 05/24/2012   Involuntary movements 05/24/2012   DIZZINESS 12/31/2009   TREMOR, ESSENTIAL 12/12/2009   PALPITATIONS, RECURRENT 12/12/2009   OTHER VITAMIN B12 DEFICIENCY ANEMIA 10/30/2009   FEMALE STRESS INCONTINENCE 10/28/2009   MEMORY LOSS 10/28/2009   GERD 12/03/2008   ANEMIA, MILD 06/26/2008   DEPRESSION/ANXIETY 12/28/2007   PICA 12/28/2007   ACUTE BRONCHITIS 12/28/2007   FATIGUE 12/28/2007   Hyperlipidemia 05/15/2007   ARTIFICIAL MENOPAUSE 05/15/2007   Myalgia and myositis, unspecified 05/15/2007   Headache 05/15/2007   CHEST PAIN, ATYPICAL 05/15/2007   SYMPTOM, PAIN, ABDOMINAL, EPIGASTRIC 05/15/2007   Personal history presenting hazards to health 05/15/2007   PSTPRC STATUS, BARIATRIC SURGERY 05/15/2007   Past Medical History:  Diagnosis Date   Abrasion of right heel    during admission 10/ 2019 right heel open skin due to movement on sheet   ADD (attention deficit disorder)    Anemia, mild    Anxiety    Arthritis    joints, back   At high risk for falls    10-19-2018 pt has fell twice in past week, tripped   Chronic back pain    all over my back (10/02/2013)   Crushing injury of arm, right 02/06/1989's   it was crushed; wore cast from fingers to top of my shoulder (11/25/2   Crushing injury of left wrist 05/11/1989's   DOE (dyspnea on exertion)    Essential tremor    Fibromyalgia    History of palpitations 2002   recurrent   History of panic attacks    History of pneumonia 08/21/2018   w/ acute respiratory failure/ hypoxia   History of sepsis    admission 08-21-2018  secondary to uropathy obstructive from ureteral stone    Hyperlipidemia    Hypertension    10-19-2018 PER PT AMLODIPINE  ON HOLD SINCE 10/ 2019 DUE TO KIDNEY ISSUES PER DOCTOR   Idiopathic scoliosis 04/19/2013   Left  ureteral stone    Migraines    once/month (10/02/2013)   Osteoporosis    Other vitamin B12 deficiency anemia    PONV (postoperative nausea and vomiting)    As a child   Pulmonary nodules/lesions, multiple    Renal insufficiency    S/P gastric bypass 09/16/2003   S/P insertion of spinal cord stimulator    per pt remote is missing   Wears dentures    upper   Wears glasses    Wears glasses    Past Surgical History:  Procedure Laterality Date   ABDOMINAL HYSTERECTOMY  1980's   W/  BSO  AND APPENDECTOMY   CARDIAC CATHETERIZATION  12-13-2002    dr delford   normal coronaries   CYSTOSCOPY WITH URETEROSCOPY AND STENT PLACEMENT Left 09/27/2018   Procedure: LEFT  URETEROSCOPY, HOLMIUM LASER LITHOTRIPSY;  Surgeon: Cam Morene ORN, MD;  Location: [facility];  Service: Urology;  Laterality: Left;   D & C HYSTERSCOPY RESECTION MYOMECTOMY  1980s   HUMERUS FRACTURE SURGERY Left 04/17/2019    Comminuted complex left intra-articular distal humerus fracture/supracondylar humerus fracture displaced   INTRAMEDULLARY (IM) NAIL INTERTROCHANTERIC Right 06/01/2019   Procedure: INTRAMEDULLARY (IM) NAIL INTERTROCHANTRIC;  Surgeon: Melodi Lerner, MD;  Location: [facility];  Service: Orthopedics;  Laterality: Right;   IR NEPHROSTOMY PLACEMENT LEFT  08/22/2018   IR NEPHROSTOMY PLACEMENT LEFT  12/08/2018   KNEE ARTHROSCOPY Right 03-31-2001    dr amy @[facility]    NEPHROLITHOTOMY Left 10/20/2018   Procedure: LEFT NEPHROLITHOTOMY PERCUTANEOUS;  Surgeon: Cam Morene ORN, MD;  Location: [facility];  Service: Urology;  Laterality: Left;   ORIF HUMERUS FRACTURE Left 04/17/2019   Procedure: Open reduction internal fixation left supracondylar humerus fracture with olecranon osteotomy and ulnar nerve release and anterior transposition and repair of structures as necessary;  Surgeon: Camella Fallow, MD;  Location: [facility];  Service: Orthopedics;  Laterality: Left;  3 hrs   REDUCTION MAMMAPLASTY Bilateral 2001   ROBOT  ASSISTED LAPAROSCOPIC NEPHRECTOMY Left 12/25/2018   Procedure: XI ROBOTIC ASSISTED LAPAROSCOPIC NEPHRECTOMY, LEFT FLANK EXPLORATION WITH REMOVAL OF BATTERY PACK;  Surgeon: Cam Morene ORN, MD;  Location: [facility];  Service: Urology;  Laterality: Left;   ROUX-EN-Y GASTRIC BYPASS  09-16-2003   dr christella. gladis  @[facility]    via Laparoscopy w/ gastrojejunostomy   SPINAL CORD STIMULATOR IMPLANT  2016   10-19-2018  PER PT HAS REMOTE BUT HAS NOT USED IT SINCE DEC 2018   TONSILLECTOMY AND ADENOIDECTOMY  1955   TUBAL LIGATION  1980's   Social History[1] Social History   Socioeconomic History   Marital status: Married    Spouse name: Not on file   Number of children: 2   Years of education: Not on file   Highest education level: Not on file  Occupational History   Occupation: Retired  Tobacco Use   Smoking status: Never   Smokeless tobacco: Never  Vaping Use   Vaping status: Never Used  Substance and Sexual Activity   Alcohol use: No   Drug use: No   Sexual activity: Never    Partners: Male  Other Topics Concern   Not on file  Social History Narrative   Living w/ husband, granddaughter.--2 story 14 steps   Right hand   Caffeine 3 soda a day   Social Drivers of Health   Tobacco Use: Low Risk (10/22/2024)   Patient History    Smoking Tobacco Use: Never    Smokeless Tobacco Use: Never    Passive Exposure: Not on file  Financial Resource Strain: Low Risk (03/22/2024)   Received from [facility]   Overall Financial Resource Strain (CARDIA)    Difficulty of Paying Living Expenses: Not hard at all  Food Insecurity: No Food Insecurity (10/16/2024)   Epic    Worried About Radiation Protection Practitioner of Food in the Last Year: Never true    Ran Out of Food in the Last Year: Never true  Transportation Needs: No Transportation Needs (10/16/2024)   Epic    Lack of Transportation (Medical): No    Lack of Transportation (Non-Medical): No  Physical Activity: Insufficiently Active (09/25/2024)   Exercise Vital  Sign    Days of Exercise per Week: 3 days    Minutes of Exercise per Session: 30 min  Stress: No Stress Concern Present (09/25/2024)   [employer] of Occupational Health - Occupational Stress Questionnaire    Feeling of Stress: Not at all  Social Connections: Moderately Integrated (10/16/2024)   Social Connection and Isolation Panel    Frequency of Communication with Friends and Family: More than three times a week    Frequency of Social Gatherings with Friends and Family: More than three times a week    Attends Religious Services: More than 4 times per year  Active Member of Clubs or Organizations: No    Attends Banker Meetings: Never    Marital Status: Married  Recent Concern: Social Connections - Moderately Isolated (09/25/2024)   Social Connection and Isolation Panel    Frequency of Communication with Friends and Family: More than three times a week    Frequency of Social Gatherings with Friends and Family: More than three times a week    Attends Religious Services: Never    Database Administrator or Organizations: No    Attends Banker Meetings: Never    Marital Status: Married  Catering Manager Violence: Not At Risk (10/16/2024)   Epic    Fear of Current or Ex-Partner: No    Emotionally Abused: No    Physically Abused: No    Sexually Abused: No  Depression (PHQ2-9): Low Risk (09/25/2024)   Depression (PHQ2-9)    PHQ-2 Score: 0  Recent Concern: Depression (PHQ2-9) - High Risk (08/07/2024)   Depression (PHQ2-9)    PHQ-2 Score: 14  Alcohol Screen: Low Risk (09/27/2022)   Alcohol Screen    Last Alcohol Screening Score (AUDIT): 0  Housing: Low Risk (10/16/2024)   Epic    Unable to Pay for Housing in the Last Year: No    Number of Times Moved in the Last Year: 0    Homeless in the Last Year: No  Utilities: Not At Risk (10/16/2024)   Epic    Threatened with loss of utilities: No  Health Literacy: Adequate Health Literacy (09/25/2024)   B1300  Health Literacy    Frequency of need for help with medical instructions: Never   Family Status  Relation Name Status   Mother  Deceased at age 73s       Met her birth mom later in life (Cause of death - MI)  No partnership data on file   Family History  Adopted: Yes  Problem Relation Age of Onset   COPD Mother    Emphysema Mother    Heart attack Mother    Other Mother        tobacco abuse   Allergies[2]    Review of Systems  Constitutional:  Negative for fever and malaise/fatigue.  HENT:  Negative for congestion.   Eyes:  Negative for blurred vision.  Respiratory:  Negative for shortness of breath.   Cardiovascular:  Negative for chest pain, palpitations and leg swelling.  Gastrointestinal:  Negative for abdominal pain, blood in stool and nausea.  Genitourinary:  Negative for dysuria and frequency.  Musculoskeletal:  Negative for falls.  Skin:  Negative for rash.  Neurological:  Negative for dizziness, loss of consciousness and headaches.  Endo/Heme/Allergies:  Negative for environmental allergies.  Psychiatric/Behavioral:  Negative for depression. The patient is not nervous/anxious.       Objective:     BP (!) 128/90 (BP Location: Right Arm, Patient Position: Sitting, Cuff Size: Normal)   Pulse 68   Temp 98.2 F (36.8 C) (Oral)   Resp 16   Ht 5' 5 (1.651 m)   BMI 22.53 kg/m  BP Readings from Last 3 Encounters:  10/22/24 (!) 128/90  10/18/24 (!) 163/98  10/10/24 (!) 178/124   Wt Readings from Last 3 Encounters:  10/16/24 135 lb 5.8 oz (61.4 kg)  09/25/24 135 lb (61.2 kg)  08/23/24 139 lb (63 kg)   SpO2 Readings from Last 3 Encounters:  10/18/24 93%  10/10/24 93%  09/03/24 93%      Physical Exam Vitals and nursing  note reviewed.  Constitutional:      General: She is not in acute distress.    Appearance: Normal appearance. She is well-developed.  HENT:     Head: Normocephalic and atraumatic.  Eyes:     General: No scleral icterus.       Right  eye: No discharge.        Left eye: No discharge.  Cardiovascular:     Rate and Rhythm: Normal rate and regular rhythm.     Heart sounds: No murmur heard. Pulmonary:     Effort: Pulmonary effort is normal. No respiratory distress.     Breath sounds: Normal breath sounds.  Abdominal:     General: There is no distension.     Palpations: There is no mass.     Tenderness: There is no abdominal tenderness. There is no guarding or rebound.     Hernia: No hernia is present.  Musculoskeletal:        General: Normal range of motion.     Cervical back: Normal range of motion and neck supple.     Right lower leg: No edema.     Left lower leg: No edema.  Skin:    General: Skin is warm and dry.  Neurological:     Mental Status: She is alert and oriented to person, place, and time.  Psychiatric:        Mood and Affect: Mood normal.        Behavior: Behavior normal.        Thought Content: Thought content normal.        Judgment: Judgment normal.      No results found for any visits on 10/22/24.  Last CBC Lab Results  Component Value Date   WBC 6.2 10/18/2024   HGB 12.4 10/18/2024   HCT 37.4 10/18/2024   MCV 100.8 (H) 10/18/2024   MCH 33.4 10/18/2024   RDW 15.5 10/18/2024   PLT 193 10/18/2024   Last metabolic panel Lab Results  Component Value Date   GLUCOSE 104 (H) 10/18/2024   NA 135 10/18/2024   K 4.1 10/18/2024   CL 99 10/18/2024   CO2 27 10/18/2024   BUN 10 10/18/2024   CREATININE 0.75 10/18/2024   GFRNONAA >60 10/18/2024   CALCIUM 8.9 10/18/2024   PHOS 3.2 12/10/2018   PROT 5.5 (L) 10/16/2024   ALBUMIN 2.9 (L) 10/16/2024   BILITOT 0.5 10/16/2024   ALKPHOS 115 10/16/2024   AST 13 (L) 10/16/2024   ALT <5 10/16/2024   ANIONGAP 9 10/18/2024   Last lipids Lab Results  Component Value Date   CHOL 177 05/22/2024   HDL 89.50 05/22/2024   LDLCALC 69 05/22/2024   LDLDIRECT 129.1 07/17/2007   TRIG 91.0 05/22/2024   CHOLHDL 2 05/22/2024   Last hemoglobin A1c Lab  Results  Component Value Date   HGBA1C 5.3 04/22/2014   Last thyroid functions Lab Results  Component Value Date   TSH 1.260 10/16/2024   Last vitamin D  Lab Results  Component Value Date   VD25OH 20.8 (L) 06/01/2019   Last vitamin B12 and Folate Lab Results  Component Value Date   VITAMINB12 572 06/03/2019   FOLATE 11.0 10/28/2009      The 10-year ASCVD risk score (Arnett DK, et al., 2019) is: 23.7%    Assessment & Plan:   Problem List Items Addressed This Visit       Unprioritized   Anxiety   Relevant Medications   clonazePAM  (KLONOPIN ) 0.5 MG tablet  Primary hypertension   Well controlled, no changes to meds. Encouraged heart healthy diet such as the DASH diet and exercise as tolerated.        Relevant Medications   amLODipine  (NORVASC ) 5 MG tablet   Nonintractable headache   stable      Relevant Medications   amLODipine  (NORVASC ) 5 MG tablet   SUMAtriptan  (IMITREX ) 50 MG tablet   clonazePAM  (KLONOPIN ) 0.5 MG tablet   Hyperlipidemia   Encourage heart healthy diet such as MIND or DASH diet, increase exercise, avoid trans fats, simple carbohydrates and processed foods, consider a krill or fish or flaxseed oil cap daily.        Relevant Medications   amLODipine  (NORVASC ) 5 MG tablet   Other Visit Diagnoses       History of UTI    -  Primary   Relevant Orders   POCT Urinalysis Dipstick (Automated)   CBC with Differential/Platelet   Comprehensive metabolic panel with GFR     Estrogen deficiency       Relevant Orders   DG Bone Density     Assessment and Plan Assessment & Plan Urinary tract infection with suprapubic catheter   She was recently hospitalized for a massive kidney infection with symptoms of vomiting, diarrhea, and weakness. Currently, she is asymptomatic but requires confirmation of infection resolution. A suprapubic catheter is in place, causing irritation and a possible reaction to the rubber material. Urine was checked to confirm  infection resolution, and blood work was ordered to assess kidney function. Apply cortisone cream to the suprapubic area for irritation and use a barrier cream like A and D ointment or Vaseline to prevent urine irritation. Change the catheter once a month at the doctor's office.  Primary hypertension   Blood pressure management is complicated by recent medication changes. Amlodipine  and Rexulti  were discontinued due to side effects and blood pressure concerns. She is currently on losartan  and Klonopin . Blood pressure is slightly elevated but not critically high. Continue losartan  and Klonopin . Avoid Adderall  due to its potential impact on blood pressure.  General health maintenance   Routine health maintenance was discussed, including mammograms and bone density screening. No recent mammogram has been performed. A bone density scan was ordered, and a mammogram will be scheduled.    No follow-ups on file.    Molly JONELLE Antonio Cyndee, DO     [1]  Social History Tobacco Use   Smoking status: Never   Smokeless tobacco: Never  Vaping Use   Vaping status: Never Used  Substance Use Topics   Alcohol use: No   Drug use: No  [2]  Allergies Allergen Reactions   Zanaflex  [Tizanidine ] Other (See Comments)    Hallucinations only with higher doses- otherwise, it is tolerated    Adhesive [Tape] Itching

## 2024-10-23 ENCOUNTER — Ambulatory Visit: Payer: Self-pay | Admitting: Family Medicine

## 2024-11-02 ENCOUNTER — Emergency Department (HOSPITAL_COMMUNITY)

## 2024-11-02 ENCOUNTER — Encounter (HOSPITAL_COMMUNITY): Payer: Self-pay

## 2024-11-02 ENCOUNTER — Emergency Department (HOSPITAL_COMMUNITY): Admitting: Anesthesiology

## 2024-11-02 ENCOUNTER — Inpatient Hospital Stay (HOSPITAL_COMMUNITY)
Admission: EM | Admit: 2024-11-02 | Discharge: 2024-11-14 | DRG: 326 | Disposition: A | Discharge status: Skilled Nursing Facility

## 2024-11-02 ENCOUNTER — Encounter (HOSPITAL_COMMUNITY): Admission: EM | Disposition: A | Discharge status: Skilled Nursing Facility | Payer: Self-pay | Source: Home / Self Care

## 2024-11-02 DIAGNOSIS — K631 Perforation of intestine (nontraumatic): Principal | ICD-10-CM | POA: Diagnosis present

## 2024-11-02 DIAGNOSIS — R54 Age-related physical debility: Secondary | ICD-10-CM | POA: Diagnosis present

## 2024-11-02 DIAGNOSIS — I519 Heart disease, unspecified: Secondary | ICD-10-CM | POA: Diagnosis present

## 2024-11-02 DIAGNOSIS — A419 Sepsis, unspecified organism: Secondary | ICD-10-CM | POA: Diagnosis present

## 2024-11-02 DIAGNOSIS — Z6822 Body mass index (BMI) 22.0-22.9, adult: Secondary | ICD-10-CM

## 2024-11-02 DIAGNOSIS — F411 Generalized anxiety disorder: Secondary | ICD-10-CM | POA: Diagnosis present

## 2024-11-02 DIAGNOSIS — Z9181 History of falling: Secondary | ICD-10-CM

## 2024-11-02 DIAGNOSIS — I10 Essential (primary) hypertension: Secondary | ICD-10-CM | POA: Diagnosis not present

## 2024-11-02 DIAGNOSIS — Z79891 Long term (current) use of opiate analgesic: Secondary | ICD-10-CM

## 2024-11-02 DIAGNOSIS — E872 Acidosis, unspecified: Secondary | ICD-10-CM | POA: Diagnosis present

## 2024-11-02 DIAGNOSIS — Z8249 Family history of ischemic heart disease and other diseases of the circulatory system: Secondary | ICD-10-CM

## 2024-11-02 DIAGNOSIS — D696 Thrombocytopenia, unspecified: Secondary | ICD-10-CM | POA: Diagnosis present

## 2024-11-02 DIAGNOSIS — D649 Anemia, unspecified: Secondary | ICD-10-CM | POA: Diagnosis present

## 2024-11-02 DIAGNOSIS — E876 Hypokalemia: Secondary | ICD-10-CM | POA: Diagnosis present

## 2024-11-02 DIAGNOSIS — F05 Delirium due to known physiological condition: Secondary | ICD-10-CM | POA: Diagnosis not present

## 2024-11-02 DIAGNOSIS — E785 Hyperlipidemia, unspecified: Secondary | ICD-10-CM | POA: Diagnosis present

## 2024-11-02 DIAGNOSIS — I129 Hypertensive chronic kidney disease with stage 1 through stage 4 chronic kidney disease, or unspecified chronic kidney disease: Secondary | ICD-10-CM | POA: Diagnosis present

## 2024-11-02 DIAGNOSIS — I959 Hypotension, unspecified: Secondary | ICD-10-CM | POA: Diagnosis present

## 2024-11-02 DIAGNOSIS — J9601 Acute respiratory failure with hypoxia: Secondary | ICD-10-CM | POA: Diagnosis present

## 2024-11-02 DIAGNOSIS — Z9682 Presence of neurostimulator: Secondary | ICD-10-CM

## 2024-11-02 DIAGNOSIS — Z91040 Latex allergy status: Secondary | ICD-10-CM

## 2024-11-02 DIAGNOSIS — Z8701 Personal history of pneumonia (recurrent): Secondary | ICD-10-CM

## 2024-11-02 DIAGNOSIS — G8929 Other chronic pain: Secondary | ICD-10-CM | POA: Diagnosis present

## 2024-11-02 DIAGNOSIS — G894 Chronic pain syndrome: Secondary | ICD-10-CM | POA: Diagnosis present

## 2024-11-02 DIAGNOSIS — N179 Acute kidney failure, unspecified: Secondary | ICD-10-CM | POA: Diagnosis present

## 2024-11-02 DIAGNOSIS — D62 Acute posthemorrhagic anemia: Secondary | ICD-10-CM | POA: Diagnosis present

## 2024-11-02 DIAGNOSIS — F418 Other specified anxiety disorders: Secondary | ICD-10-CM | POA: Diagnosis not present

## 2024-11-02 DIAGNOSIS — Z888 Allergy status to other drugs, medicaments and biological substances status: Secondary | ICD-10-CM

## 2024-11-02 DIAGNOSIS — M549 Dorsalgia, unspecified: Secondary | ICD-10-CM | POA: Diagnosis present

## 2024-11-02 DIAGNOSIS — Z905 Acquired absence of kidney: Secondary | ICD-10-CM

## 2024-11-02 DIAGNOSIS — R198 Other specified symptoms and signs involving the digestive system and abdomen: Principal | ICD-10-CM

## 2024-11-02 DIAGNOSIS — E46 Unspecified protein-calorie malnutrition: Secondary | ICD-10-CM | POA: Diagnosis present

## 2024-11-02 DIAGNOSIS — Z87442 Personal history of urinary calculi: Secondary | ICD-10-CM

## 2024-11-02 DIAGNOSIS — E1122 Type 2 diabetes mellitus with diabetic chronic kidney disease: Secondary | ICD-10-CM | POA: Diagnosis present

## 2024-11-02 DIAGNOSIS — G9341 Metabolic encephalopathy: Secondary | ICD-10-CM | POA: Diagnosis present

## 2024-11-02 DIAGNOSIS — M797 Fibromyalgia: Secondary | ICD-10-CM | POA: Diagnosis present

## 2024-11-02 DIAGNOSIS — Z91048 Other nonmedicinal substance allergy status: Secondary | ICD-10-CM

## 2024-11-02 DIAGNOSIS — R100 Acute abdomen: Secondary | ICD-10-CM | POA: Diagnosis present

## 2024-11-02 DIAGNOSIS — G250 Essential tremor: Secondary | ICD-10-CM | POA: Diagnosis present

## 2024-11-02 DIAGNOSIS — R6520 Severe sepsis without septic shock: Secondary | ICD-10-CM | POA: Diagnosis present

## 2024-11-02 DIAGNOSIS — K668 Other specified disorders of peritoneum: Secondary | ICD-10-CM

## 2024-11-02 DIAGNOSIS — M810 Age-related osteoporosis without current pathological fracture: Secondary | ICD-10-CM | POA: Diagnosis present

## 2024-11-02 DIAGNOSIS — Z825 Family history of asthma and other chronic lower respiratory diseases: Secondary | ICD-10-CM

## 2024-11-02 DIAGNOSIS — Z90710 Acquired absence of both cervix and uterus: Secondary | ICD-10-CM

## 2024-11-02 DIAGNOSIS — K255 Chronic or unspecified gastric ulcer with perforation: Secondary | ICD-10-CM | POA: Diagnosis present

## 2024-11-02 DIAGNOSIS — Z79899 Other long term (current) drug therapy: Secondary | ICD-10-CM

## 2024-11-02 HISTORY — PX: LAPAROTOMY: SHX154

## 2024-11-02 LAB — CBC WITH DIFFERENTIAL/PLATELET
Abs Immature Granulocytes: 0.24 K/uL — ABNORMAL HIGH (ref 0.00–0.07)
Basophils Absolute: 0 K/uL (ref 0.0–0.1)
Basophils Relative: 0 %
Eosinophils Absolute: 0 K/uL (ref 0.0–0.5)
Eosinophils Relative: 0 %
HCT: 44.7 % (ref 36.0–46.0)
Hemoglobin: 13.8 g/dL (ref 12.0–15.0)
Immature Granulocytes: 3 %
Lymphocytes Relative: 10 %
Lymphs Abs: 0.9 K/uL (ref 0.7–4.0)
MCH: 34.1 pg — ABNORMAL HIGH (ref 26.0–34.0)
MCHC: 30.9 g/dL (ref 30.0–36.0)
MCV: 110.4 fL — ABNORMAL HIGH (ref 80.0–100.0)
Monocytes Absolute: 0.2 K/uL (ref 0.1–1.0)
Monocytes Relative: 3 %
Neutro Abs: 7.1 K/uL (ref 1.7–7.7)
Neutrophils Relative %: 84 %
Platelets: 241 K/uL (ref 150–400)
RBC: 4.05 MIL/uL (ref 3.87–5.11)
RDW: 16.9 % — ABNORMAL HIGH (ref 11.5–15.5)
WBC: 8.5 K/uL (ref 4.0–10.5)
nRBC: 0 % (ref 0.0–0.2)

## 2024-11-02 LAB — PROTIME-INR
INR: 1.4 — ABNORMAL HIGH (ref 0.8–1.2)
Prothrombin Time: 17.5 s — ABNORMAL HIGH (ref 11.4–15.2)

## 2024-11-02 LAB — COMPREHENSIVE METABOLIC PANEL WITH GFR
ALT: 13 U/L (ref 0–44)
AST: 63 U/L — ABNORMAL HIGH (ref 15–41)
Albumin: 4 g/dL (ref 3.5–5.0)
Alkaline Phosphatase: 105 U/L (ref 38–126)
Anion gap: 20 — ABNORMAL HIGH (ref 5–15)
BUN: 39 mg/dL — ABNORMAL HIGH (ref 8–23)
CO2: 16 mmol/L — ABNORMAL LOW (ref 22–32)
Calcium: 9.2 mg/dL (ref 8.9–10.3)
Chloride: 106 mmol/L (ref 98–111)
Creatinine, Ser: 2.53 mg/dL — ABNORMAL HIGH (ref 0.44–1.00)
GFR, Estimated: 19 mL/min — ABNORMAL LOW
Glucose, Bld: 61 mg/dL — ABNORMAL LOW (ref 70–99)
Potassium: 4.2 mmol/L (ref 3.5–5.1)
Sodium: 142 mmol/L (ref 135–145)
Total Bilirubin: 0.6 mg/dL (ref 0.0–1.2)
Total Protein: 7.9 g/dL (ref 6.5–8.1)

## 2024-11-02 LAB — I-STAT CHEM 8, ED
BUN: 64 mg/dL — ABNORMAL HIGH (ref 8–23)
Calcium, Ion: 1.13 mmol/L — ABNORMAL LOW (ref 1.15–1.40)
Chloride: 110 mmol/L (ref 98–111)
Creatinine, Ser: 2.9 mg/dL — ABNORMAL HIGH (ref 0.44–1.00)
Glucose, Bld: 58 mg/dL — ABNORMAL LOW (ref 70–99)
HCT: 45 % (ref 36.0–46.0)
Hemoglobin: 15.3 g/dL — ABNORMAL HIGH (ref 12.0–15.0)
Potassium: 5.5 mmol/L — ABNORMAL HIGH (ref 3.5–5.1)
Sodium: 139 mmol/L (ref 135–145)
TCO2: 19 mmol/L — ABNORMAL LOW (ref 22–32)

## 2024-11-02 LAB — I-STAT CG4 LACTIC ACID, ED
Lactic Acid, Venous: 4.4 mmol/L (ref 0.5–1.9)
Lactic Acid, Venous: 2.8 mmol/L (ref 0.5–1.9)

## 2024-11-02 LAB — RESP PANEL BY RT-PCR (RSV, FLU A&B, COVID)  RVPGX2
Influenza A by PCR: NEGATIVE
Influenza B by PCR: NEGATIVE
Resp Syncytial Virus by PCR: NEGATIVE
SARS Coronavirus 2 by RT PCR: NEGATIVE

## 2024-11-02 LAB — TROPONIN T, HIGH SENSITIVITY: Troponin T High Sensitivity: 21 ng/L — ABNORMAL HIGH (ref 0–19)

## 2024-11-02 SURGERY — LAPAROTOMY, EXPLORATORY
Anesthesia: General

## 2024-11-02 MED ORDER — ROCURONIUM BROMIDE 10 MG/ML (PF) SYRINGE
PREFILLED_SYRINGE | INTRAVENOUS | Status: AC
  Filled 2024-11-02: qty 10

## 2024-11-02 MED ORDER — LACTATED RINGERS IV BOLUS (SEPSIS)
1000.0000 mL | Freq: Once | INTRAVENOUS | Status: AC
  Administered 2024-11-02: 1000 mL via INTRAVENOUS

## 2024-11-02 MED ORDER — FENTANYL CITRATE (PF) 50 MCG/ML IJ SOSY
25.0000 ug | PREFILLED_SYRINGE | Freq: Once | INTRAMUSCULAR | Status: AC
  Administered 2024-11-02: 25 ug via INTRAVENOUS
  Filled 2024-11-02: qty 1

## 2024-11-02 MED ORDER — PHENYLEPHRINE 80 MCG/ML (10ML) SYRINGE FOR IV PUSH (FOR BLOOD PRESSURE SUPPORT)
PREFILLED_SYRINGE | INTRAVENOUS | Status: AC
  Filled 2024-11-02: qty 10

## 2024-11-02 MED ORDER — PROPOFOL 10 MG/ML IV BOLUS
INTRAVENOUS | Status: AC
  Filled 2024-11-02: qty 20

## 2024-11-02 MED ORDER — SUCCINYLCHOLINE CHLORIDE 200 MG/10ML IV SOSY
PREFILLED_SYRINGE | INTRAVENOUS | Status: AC
  Filled 2024-11-02: qty 10

## 2024-11-02 MED ORDER — CHLORHEXIDINE GLUCONATE 0.12 % MT SOLN
15.0000 mL | Freq: Once | OROMUCOSAL | Status: AC
  Administered 2024-11-02: 15 mL via OROMUCOSAL

## 2024-11-02 MED ORDER — CHLORHEXIDINE GLUCONATE CLOTH 2 % EX PADS
6.0000 | MEDICATED_PAD | Freq: Once | CUTANEOUS | Status: AC
  Administered 2024-11-02: 6 via TOPICAL

## 2024-11-02 MED ORDER — VANCOMYCIN HCL 750 MG/150ML IV SOLN: 750.0000 mg | INTRAVENOUS | Status: DC

## 2024-11-02 MED ORDER — VANCOMYCIN HCL IN DEXTROSE 1-5 GM/200ML-% IV SOLN: 1000.0000 mg | Freq: Once | INTRAVENOUS | Status: DC

## 2024-11-02 MED ORDER — LIDOCAINE HCL (PF) 2 % IJ SOLN
INTRAMUSCULAR | Status: AC
  Filled 2024-11-02: qty 5

## 2024-11-02 MED ORDER — FENTANYL CITRATE (PF) 100 MCG/2ML IJ SOLN
INTRAMUSCULAR | Status: AC
  Filled 2024-11-02: qty 2

## 2024-11-02 MED ORDER — METRONIDAZOLE 500 MG/100ML IV SOLN
500.0000 mg | Freq: Once | INTRAVENOUS | Status: AC
  Administered 2024-11-02: 500 mg via INTRAVENOUS
  Filled 2024-11-02: qty 100

## 2024-11-02 MED ORDER — LACTATED RINGERS IV BOLUS (SEPSIS)
1000.0000 mL | Freq: Once | INTRAVENOUS | Status: AC
  Administered 2024-11-03: 1000 mL via INTRAVENOUS

## 2024-11-02 MED ORDER — SODIUM CHLORIDE 0.9 % IV SOLN
2.0000 g | Freq: Once | INTRAVENOUS | Status: AC
  Administered 2024-11-02: 2 g via INTRAVENOUS
  Filled 2024-11-02: qty 12.5

## 2024-11-02 MED ORDER — IOHEXOL 350 MG/ML SOLN
100.0000 mL | Freq: Once | INTRAVENOUS | Status: AC | PRN
  Administered 2024-11-02: 75 mL via INTRAVENOUS

## 2024-11-02 MED ORDER — LACTATED RINGERS IV SOLN: INTRAVENOUS | Status: DC

## 2024-11-02 SURGICAL SUPPLY — 40 items
APPLICATOR COTTON TIP 6 STRL (MISCELLANEOUS) ×1 IMPLANT
BAG COUNTER SPONGE SURGICOUNT (BAG) IMPLANT
BLADE EXTENDED COATED 6.5IN (ELECTRODE) IMPLANT
BLADE HEX COATED 2.75 (ELECTRODE) ×1 IMPLANT
BNDG GAUZE DERMACEA FLUFF 4 (GAUZE/BANDAGES/DRESSINGS) IMPLANT
CHLORAPREP W/TINT 26 (MISCELLANEOUS) ×1 IMPLANT
COVER MAYO STAND STRL (DRAPES) IMPLANT
COVER SURGICAL LIGHT HANDLE (MISCELLANEOUS) ×1 IMPLANT
DRAIN CHANNEL 19F RND (DRAIN) IMPLANT
DRAPE LAPAROSCOPIC ABDOMINAL (DRAPES) ×1 IMPLANT
DRAPE WARM FLUID 44X44 (DRAPES) IMPLANT
ELECT REM PT RETURN 15FT ADLT (MISCELLANEOUS) ×1 IMPLANT
EVACUATOR SILICONE 100CC (DRAIN) IMPLANT
GAUZE PAD ABD 8X10 STRL (GAUZE/BANDAGES/DRESSINGS) IMPLANT
GAUZE SPONGE 4X4 12PLY STRL (GAUZE/BANDAGES/DRESSINGS) ×1 IMPLANT
GLOVE BIO SURGEON STRL SZ7.5 (GLOVE) ×2 IMPLANT
GLOVE BIOGEL PI IND STRL 7.0 (GLOVE) ×1 IMPLANT
GOWN STRL REUS W/ TWL LRG LVL3 (GOWN DISPOSABLE) ×1 IMPLANT
GOWN STRL REUS W/ TWL XL LVL3 (GOWN DISPOSABLE) ×1 IMPLANT
HANDLE SUCTION POOLE (INSTRUMENTS) IMPLANT
KIT BASIN OR (CUSTOM PROCEDURE TRAY) ×1 IMPLANT
KIT TURNOVER KIT A (KITS) ×1 IMPLANT
LIGASURE IMPACT 36 18CM CVD LR (INSTRUMENTS) IMPLANT
NS IRRIG 1000ML POUR BTL (IV SOLUTION) ×1 IMPLANT
PACK GENERAL/GYN (CUSTOM PROCEDURE TRAY) ×1 IMPLANT
RELOAD STAPLE 75 3.8 BLU REG (ENDOMECHANICALS) IMPLANT
SPONGE T-LAP 18X18 ~~LOC~~+RFID (SPONGE) IMPLANT
STAPLER PROXIMATE 75MM BLUE (STAPLE) ×1 IMPLANT
STAPLER SKIN PROX 35W (STAPLE) ×1 IMPLANT
SUT ETHILON 2 0 PS N (SUTURE) IMPLANT
SUT PDS AB 1 TP1 96 (SUTURE) IMPLANT
SUT SILK 2 0 SH CR/8 (SUTURE) IMPLANT
SUT SILK 2-0 18XBRD TIE 12 (SUTURE) IMPLANT
SUT SILK 3 0 SH CR/8 (SUTURE) IMPLANT
SUT SILK 3-0 18XBRD TIE 12 (SUTURE) IMPLANT
TAPE CLOTH SURG 4X10 WHT LF (GAUZE/BANDAGES/DRESSINGS) IMPLANT
TOWEL OR DSP ST BLU DLX 10/PK (DISPOSABLE) ×2 IMPLANT
TRAY FOLEY MTR SLVR 16FR STAT (SET/KITS/TRAYS/PACK) IMPLANT
WATER STERILE IRR 1000ML POUR (IV SOLUTION) ×1 IMPLANT
YANKAUER SUCT BULB TIP NO VENT (SUCTIONS) IMPLANT

## 2024-11-02 NOTE — Sepsis Progress Note (Signed)
 Elink monitoring for the code sepsis protocol.

## 2024-11-02 NOTE — Progress Notes (Signed)
 Pharmacy Antibiotic Note  [name] is a 78 y.o. female admitted on 11/02/2024 with sepsis.  Pharmacy has been consulted for vancomycin  dosing.  Plan: Vancomycin  1gm IV x 1 then 750mg  q48h (AUC 507.2, Scr 2.9) Follow renal function and clinical course  Weight: 61.4 kg (135 lb 5.8 oz)  Temp (24hrs), Avg:97.8 F (36.6 C), Min:97.8 F (36.6 C), Max:97.8 F (36.6 C)  Recent Labs  Lab 11/02/24 2013 11/02/24 2015 11/02/24 2016  WBC 8.5  --   --   CREATININE 2.53*  --  2.90*  LATICACIDVEN  --  4.4*  --     Estimated Creatinine Clearance: 14.9 mL/min (A) (by C-G formula based on SCr of 2.9 mg/dL (H)).    Allergies[1]  Antimicrobials this admission: 12/26 cefepime  x 1 12/26 flagyl  x 1 12/26 vanc >>  Dose adjustments this admission:   Microbiology results: 12/26 BCx:   Thank you for allowing pharmacy to be a part of this patients care.  Leeroy Mace RPh 11/02/2024, 9:51 PM     [1]  Allergies Allergen Reactions   Zanaflex  [Tizanidine ] Other (See Comments)    Hallucinations only with higher doses- otherwise, it is tolerated    Adhesive [Tape] Itching

## 2024-11-02 NOTE — H&P (Signed)
 [name] is an 78 y.o. female.   Chief Complaint: Abdominal pain HPI: The patient is a 78 year old white female who presents to the emergency department tonight with complaints of abdominal pain that she states has been going on for months.  She is very agitated and disoriented.  She underwent a CT scan which did show a large amount of free air consistent with a bowel perforation.  She has hypotension and tachycardia with acute kidney injury.  She had a history of a gastric bypass in 2004  Past Medical History:  Diagnosis Date   Abrasion of right heel    during admission 10/ 2019 right heel open skin due to movement on sheet   ADD (attention deficit disorder)    Anemia, mild    Anxiety    Arthritis    joints, back   At high risk for falls    10-19-2018 pt has fell twice in past week, tripped   Chronic back pain    all over my back (10/02/2013)   Crushing injury of arm, right 02/06/1989's   it was crushed; wore cast from fingers to top of my shoulder (11/25/2   Crushing injury of left wrist 05/11/1989's   DOE (dyspnea on exertion)    Essential tremor    Fibromyalgia    History of palpitations 2002   recurrent   History of panic attacks    History of pneumonia 08/21/2018   w/ acute respiratory failure/ hypoxia   History of sepsis    admission 08-21-2018  secondary to uropathy obstructive from ureteral stone    Hyperlipidemia    Hypertension    10-19-2018 PER PT AMLODIPINE  ON HOLD SINCE 10/ 2019 DUE TO KIDNEY ISSUES PER DOCTOR   Idiopathic scoliosis 04/19/2013   Left ureteral stone    Migraines    once/month (10/02/2013)   Osteoporosis    Other vitamin B12 deficiency anemia    PONV (postoperative nausea and vomiting)    As a child   Pulmonary nodules/lesions, multiple    Renal insufficiency    S/P gastric bypass 09/16/2003   S/P insertion of spinal cord stimulator    per pt remote is missing   Wears dentures    upper   Wears glasses    Wears glasses     Past  Surgical History:  Procedure Laterality Date   ABDOMINAL HYSTERECTOMY  1980's   W/  BSO  AND APPENDECTOMY   CARDIAC CATHETERIZATION  12-13-2002    dr delford   normal coronaries   CYSTOSCOPY WITH URETEROSCOPY AND STENT PLACEMENT Left 09/27/2018   Procedure: LEFT  URETEROSCOPY, HOLMIUM LASER LITHOTRIPSY;  Surgeon: Cam Morene ORN, MD;  Location: [facility];  Service: Urology;  Laterality: Left;   D & C HYSTERSCOPY RESECTION MYOMECTOMY  1980s   HUMERUS FRACTURE SURGERY Left 04/17/2019    Comminuted complex left intra-articular distal humerus fracture/supracondylar humerus fracture displaced   INTRAMEDULLARY (IM) NAIL INTERTROCHANTERIC Right 06/01/2019   Procedure: INTRAMEDULLARY (IM) NAIL INTERTROCHANTRIC;  Surgeon: Melodi Lerner, MD;  Location: [facility];  Service: Orthopedics;  Laterality: Right;   IR NEPHROSTOMY PLACEMENT LEFT  08/22/2018   IR NEPHROSTOMY PLACEMENT LEFT  12/08/2018   KNEE ARTHROSCOPY Right 03-31-2001    dr amy @[facility]    NEPHROLITHOTOMY Left 10/20/2018   Procedure: LEFT NEPHROLITHOTOMY PERCUTANEOUS;  Surgeon: Cam Morene ORN, MD;  Location: [facility];  Service: Urology;  Laterality: Left;   ORIF HUMERUS FRACTURE Left 04/17/2019   Procedure: Open reduction internal fixation left supracondylar humerus fracture with  olecranon osteotomy and ulnar nerve release and anterior transposition and repair of structures as necessary;  Surgeon: Camella Fallow, MD;  Location: [facility];  Service: Orthopedics;  Laterality: Left;  3 hrs   REDUCTION MAMMAPLASTY Bilateral 2001   ROBOT ASSISTED LAPAROSCOPIC NEPHRECTOMY Left 12/25/2018   Procedure: XI ROBOTIC ASSISTED LAPAROSCOPIC NEPHRECTOMY, LEFT FLANK EXPLORATION WITH REMOVAL OF BATTERY PACK;  Surgeon: Cam Morene ORN, MD;  Location: [facility];  Service: Urology;  Laterality: Left;   ROUX-EN-Y GASTRIC BYPASS  09-16-2003   dr christella. gladis  @[facility]    via Laparoscopy w/ gastrojejunostomy   SPINAL CORD STIMULATOR IMPLANT  2016   10-19-2018  PER PT HAS  REMOTE BUT HAS NOT USED IT SINCE DEC 2018   TONSILLECTOMY AND ADENOIDECTOMY  1955   TUBAL LIGATION  1980's    Family History  Adopted: Yes  Problem Relation Age of Onset   COPD Mother    Emphysema Mother    Heart attack Mother    Other Mother        tobacco abuse   Social History:  reports that she has never smoked. She has never used smokeless tobacco. She reports that she does not drink alcohol and does not use drugs.  Allergies: Allergies[1]  (Not in a hospital admission)   Results for orders placed or performed during the hospital encounter of 11/02/24 (from the past 48 hours)  Comprehensive metabolic panel     Status: Abnormal   Collection Time: 11/02/24  8:13 PM  Result Value Ref Range   Sodium 142 135 - 145 mmol/L   Potassium 4.2 3.5 - 5.1 mmol/L   Chloride 106 98 - 111 mmol/L   CO2 16 (L) 22 - 32 mmol/L   Glucose, Bld 61 (L) 70 - 99 mg/dL    Comment: Glucose reference range applies only to samples taken after fasting for at least 8 hours.   BUN 39 (H) 8 - 23 mg/dL   Creatinine, Ser 7.46 (H) 0.44 - 1.00 mg/dL   Calcium 9.2 8.9 - 89.6 mg/dL   Total Protein 7.9 6.5 - 8.1 g/dL   Albumin  4.0 3.5 - 5.0 g/dL   AST 63 (H) 15 - 41 U/L   ALT 13 0 - 44 U/L   Alkaline Phosphatase 105 38 - 126 U/L   Total Bilirubin 0.6 0.0 - 1.2 mg/dL   GFR, Estimated 19 (L) >60 mL/min    Comment: (NOTE) Calculated using the CKD-EPI Creatinine Equation (2021)    Anion gap 20 (H) 5 - 15    Comment: Performed at [facility], [address], [place], [place] [address]  CBC with Differential     Status: Abnormal   Collection Time: 11/02/24  8:13 PM  Result Value Ref Range   WBC 8.5 4.0 - 10.5 K/uL   RBC 4.05 3.87 - 5.11 MIL/uL   Hemoglobin 13.8 12.0 - 15.0 g/dL   HCT 55.2 63.9 - 53.9 %   MCV 110.4 (H) 80.0 - 100.0 fL   MCH 34.1 (H) 26.0 - 34.0 pg   MCHC 30.9 30.0 - 36.0 g/dL   RDW 83.0 (H) 88.4 - 84.4 %   Platelets 241 150 - 400 K/uL   nRBC 0.0 0.0 - 0.2 %    Neutrophils Relative % 84 %   Neutro Abs 7.1 1.7 - 7.7 K/uL   Lymphocytes Relative 10 %   Lymphs Abs 0.9 0.7 - 4.0 K/uL   Monocytes Relative 3 %   Monocytes Absolute 0.2 0.1 -  1.0 K/uL   Eosinophils Relative 0 %   Eosinophils Absolute 0.0 0.0 - 0.5 K/uL   Basophils Relative 0 %   Basophils Absolute 0.0 0.0 - 0.1 K/uL   Immature Granulocytes 3 %   Abs Immature Granulocytes 0.24 (H) 0.00 - 0.07 K/uL    Comment: Performed at [facility], [address] [place], [place] [address]  Troponin T, High Sensitivity     Status: Abnormal   Collection Time: 11/02/24  8:13 PM  Result Value Ref Range   Troponin T High Sensitivity 21 (H) 0 - 19 ng/L    Comment: (NOTE) Biotin concentrations > 1000 ng/mL falsely decrease TnT results.  Serial cardiac troponin measurements are suggested.  Refer to the Links section for chest pain algorithms and additional  guidance. Performed at [facility], [address] [place], [place] [address]   I-Stat CG4 Lactic Acid     Status: Abnormal   Collection Time: 11/02/24  8:15 PM  Result Value Ref Range   Lactic Acid, Venous 4.4 (HH) 0.5 - 1.9 mmol/L   Comment NOTIFIED PHYSICIAN   I-stat chem 8, ED (not at [facility], [facility] or [facility])     Status: Abnormal   Collection Time: 11/02/24  8:16 PM  Result Value Ref Range   Sodium 139 135 - 145 mmol/L   Potassium 5.5 (H) 3.5 - 5.1 mmol/L   Chloride 110 98 - 111 mmol/L   BUN 64 (H) 8 - 23 mg/dL   Creatinine, Ser 7.09 (H) 0.44 - 1.00 mg/dL   Glucose, Bld 58 (L) 70 - 99 mg/dL    Comment: Glucose reference range applies only to samples taken after fasting for at least 8 hours.   Calcium, Ion 1.13 (L) 1.15 - 1.40 mmol/L   TCO2 19 (L) 22 - 32 mmol/L   Hemoglobin 15.3 (H) 12.0 - 15.0 g/dL   HCT 54.9 63.9 - 53.9 %  Resp panel by RT-PCR (RSV, Flu A&B, Covid) Anterior Nasal Swab     Status: None   Collection Time: 11/02/24  9:10 PM   Specimen: Anterior Nasal Swab  Result Value Ref Range    SARS Coronavirus 2 by RT PCR NEGATIVE NEGATIVE    Comment: (NOTE) SARS-CoV-2 target nucleic acids are NOT DETECTED.  The SARS-CoV-2 RNA is generally detectable in upper respiratory specimens during the acute phase of infection. The lowest concentration of SARS-CoV-2 viral copies this assay can detect is 138 copies/mL. A negative result does not preclude SARS-Cov-2 infection and should not be used as the sole basis for treatment or other patient management decisions. A negative result may occur with  improper specimen collection/handling, submission of specimen other than nasopharyngeal swab, presence of viral mutation(s) within the areas targeted by this assay, and inadequate number of viral copies(<138 copies/mL). A negative result must be combined with clinical observations, patient history, and epidemiological information. The expected result is Negative.  Fact Sheet for Patients:  bloggercourse.com  Fact Sheet for Healthcare Providers:  seriousbroker.it  This test is no t yet approved or cleared by the [place]  FDA and  has been authorized for detection and/or diagnosis of SARS-CoV-2 by FDA under an Emergency Use Authorization (EUA). This EUA will remain  in effect (meaning this test can be used) for the duration of the COVID-19 declaration under Section 564(b)(1) of the Act, 21 U.S.C.section 360bbb-3(b)(1), unless the authorization is terminated  or revoked sooner.       Influenza A by PCR NEGATIVE NEGATIVE  Influenza B by PCR NEGATIVE NEGATIVE    Comment: (NOTE) The Xpert Xpress SARS-CoV-2/FLU/RSV plus assay is intended as an aid in the diagnosis of influenza from Nasopharyngeal swab specimens and should not be used as a sole basis for treatment. Nasal washings and aspirates are unacceptable for Xpert Xpress SARS-CoV-2/FLU/RSV testing.  Fact Sheet for Patients: bloggercourse.com  Fact  Sheet for Healthcare Providers: seriousbroker.it  This test is not yet approved or cleared by the [place]  FDA and has been authorized for detection and/or diagnosis of SARS-CoV-2 by FDA under an Emergency Use Authorization (EUA). This EUA will remain in effect (meaning this test can be used) for the duration of the COVID-19 declaration under Section 564(b)(1) of the Act, 21 U.S.C. section 360bbb-3(b)(1), unless the authorization is terminated or revoked.     Resp Syncytial Virus by PCR NEGATIVE NEGATIVE    Comment: (NOTE) Fact Sheet for Patients: bloggercourse.com  Fact Sheet for Healthcare Providers: seriousbroker.it  This test is not yet approved or cleared by the [place]  FDA and has been authorized for detection and/or diagnosis of SARS-CoV-2 by FDA under an Emergency Use Authorization (EUA). This EUA will remain in effect (meaning this test can be used) for the duration of the COVID-19 declaration under Section 564(b)(1) of the Act, 21 U.S.C. section 360bbb-3(b)(1), unless the authorization is terminated or revoked.  Performed at [facility], [address] [place], [place] [address]   I-Stat Lactic Acid, ED     Status: Abnormal   Collection Time: 11/02/24 10:02 PM  Result Value Ref Range   Lactic Acid, Venous 2.8 (HH) 0.5 - 1.9 mmol/L   Comment NOTIFIED PHYSICIAN      Review of Systems  Constitutional: Negative.   HENT: Negative.    Eyes: Negative.   Respiratory: Negative.    Cardiovascular: Negative.   Gastrointestinal:  Positive for abdominal pain.  Endocrine: Negative.   Genitourinary: Negative.   Musculoskeletal:  Positive for arthralgias and back pain.  Skin: Negative.   Allergic/Immunologic: Negative.   Neurological: Negative.   Hematological: Negative.   Psychiatric/Behavioral:  Positive for agitation.     Blood pressure (!) 99/45, pulse (!)  108, temperature 97.8 F (36.6 C), temperature source Oral, resp. rate (!) 31, weight 61.4 kg, SpO2 100%. Physical Exam Vitals reviewed.  Constitutional:      Appearance: She is ill-appearing.  HENT:     Head: Normocephalic and atraumatic.     Right Ear: External ear normal.     Left Ear: External ear normal.     Nose: Nose normal.     Mouth/Throat:     Mouth: Mucous membranes are moist.     Pharynx: Oropharynx is clear.  Eyes:     Extraocular Movements: Extraocular movements intact.     Conjunctiva/sclera: Conjunctivae normal.     Pupils: Pupils are equal, round, and reactive to light.  Cardiovascular:     Rate and Rhythm: Regular rhythm. Tachycardia present.     Pulses: Normal pulses.     Heart sounds: Murmur heard.  Pulmonary:     Effort: Pulmonary effort is normal.     Breath sounds: Normal breath sounds.     Comments: Increased work of breathing Abdominal:     General: Abdomen is flat.     Comments: There is rigidity.  The patient reports pain  Musculoskeletal:        General: No swelling or deformity.     Cervical back: Normal range of motion and neck supple.  Comments: Family reports that she is not very mobile  Skin:    General: Skin is warm and dry.     Coloration: Skin is not jaundiced.  Neurological:     General: No focal deficit present.     Mental Status: She is alert. She is disoriented.  Psychiatric:     Comments: The patient is agitated and confused      Assessment/Plan The patient has free air consistent with a bowel perforation and multiorgan dysfunction.  Because of the risk of ongoing sepsis and death I feel she would benefit from exploration and repair or removal of the perforated bowel.  I have discussed with her and her family the risks and benefits of the operation as well as some of the technical aspects including the risk of needing to be on a ventilator and possible death and the family understands and wishes to proceed.  The patient is  confused and the husband would be the 1 to make her decisions if she is not in her right mind.  Deward Null III, MD 11/02/2024, 10:23 PM       [1]  Allergies Allergen Reactions   Zanaflex  [Tizanidine ] Other (See Comments)    Hallucinations only with higher doses- otherwise, it is tolerated    Adhesive [Tape] Itching

## 2024-11-02 NOTE — ED Triage Notes (Signed)
 Pt arrived via POV with significant other. Per spouse pt has been confused and SOB today. States she woke yesterday yelling in pain from left shoulder, down her body.

## 2024-11-02 NOTE — Anesthesia Preprocedure Evaluation (Signed)
"                                    Anesthesia Evaluation  Patient identified by MRN, date of birth, ID band Patient awake    Reviewed: Allergy & Precautions, H&P , NPO status , Patient's Chart, lab work & pertinent test results  History of Anesthesia Complications (+) PONV and history of anesthetic complications  Airway Mallampati: II  TM Distance: >3 FB Neck ROM: Full    Dental no notable dental hx.    Pulmonary neg pulmonary ROS   Pulmonary exam normal breath sounds clear to auscultation       Cardiovascular hypertension, Pt. on medications + DOE  Normal cardiovascular exam Rhythm:Regular Rate:Normal     Neuro/Psych  Headaches  Anxiety Depression     negative psych ROS   GI/Hepatic Neg liver ROS,GERD  ,,  Endo/Other  negative endocrine ROS    Renal/GU Renal InsufficiencyRenal disease  negative genitourinary   Musculoskeletal  (+)  Fibromyalgia -  Abdominal   Peds negative pediatric ROS (+)  Hematology  (+) Blood dyscrasia, anemia   Anesthesia Other Findings Free Air  Reproductive/Obstetrics negative OB ROS                              Anesthesia Physical Anesthesia Plan  ASA: 3 and emergent  Anesthesia Plan: General   Post-op Pain Management:    Induction: Intravenous, Cricoid pressure planned and Rapid sequence  PONV Risk Score and Plan: 4 or greater and Ondansetron , Dexamethasone , Midazolam  and Treatment may vary due to age or medical condition  Airway Management Planned: Oral ETT  Additional Equipment:   Intra-op Plan:   Post-operative Plan: Extubation in OR and Possible Post-op intubation/ventilation  Informed Consent: I have reviewed the patients History and Physical, chart, labs and discussed the procedure including the risks, benefits and alternatives for the proposed anesthesia with the patient or authorized representative who has indicated his/her understanding and acceptance.     Dental  advisory given  Plan Discussed with: CRNA  Anesthesia Plan Comments:         Anesthesia Quick Evaluation  "

## 2024-11-02 NOTE — ED Provider Notes (Signed)
 " [place] EMERGENCY DEPARTMENT AT [facility] Provider Note   CSN: [id number] Arrival date & time: 11/02/24  1952     Patient presents with: Shortness of Breath and Altered Mental Status   [name] is a 78 y.o. female.   78 year old female here today for 2 days of pain in the abdomen, pain on her left shoulder and down her body.  She has history of prior gastric bypass.   Shortness of Breath Altered Mental Status      Prior to Admission medications  Medication Sig Start Date End Date Taking? Authorizing Provider  amLODipine  (NORVASC ) 5 MG tablet 1 po bid 10/22/24  Yes Lowne Chase, [name], DO  clonazePAM  (KLONOPIN ) 0.5 MG tablet Take 1 tablet (0.5 mg total) by mouth 3 (three) times daily as needed. 10/22/24  Yes Antonio Cyndee Rockers R, DO  loperamide  (IMODIUM ) 2 MG capsule TAKE 1 CAPSULE BY MOUTH DAILY AS NEEDED FOR DIARRHEA OR LOOSE STOOLS 07/27/24  Yes Antonio Cyndee Rockers R, DO  losartan  (COZAAR ) 50 MG tablet TAKE 1 TABLET BY MOUTH DAILY 10/16/24  Yes Lowne Chase, [name], DO  ondansetron  (ZOFRAN -ODT) 8 MG disintegrating tablet Take 1 tablet (8 mg total) by mouth every 8 (eight) hours as needed for nausea or vomiting. 05/10/24  Yes Antonio Cyndee Rockers R, DO  oxyCODONE  (ROXICODONE ) 15 MG immediate release tablet Take 1 tablet (15 mg total) by mouth 4 (four) times daily. Patient taking differently: Take 15 mg by mouth See admin instructions. Take 15 mg by mouth every four to six hours 04/19/19  Yes Camella Fallow, MD  SUMAtriptan  (IMITREX ) 50 MG tablet TAKE 1 TABLET BY MOUTH AT ONSET OF MIGRAINE; MAY REPEAT 1 TABLET IN 2 HOURS IF NEEDED. 10/22/24  Yes Antonio Cyndee Rockers R, DO  tiZANidine  (ZANAFLEX ) 4 MG tablet Take 4 mg by mouth at bedtime. 07/05/24  Yes [provider]  traMADol  (ULTRAM ) 50 MG tablet Take 50 mg by mouth 2 (two) times daily as needed (for pain).   Yes [provider]  TYLENOL  500 MG tablet Take 500-1,000 mg by mouth every  6 (six) hours as needed for mild pain (pain score 1-3) (or headaches).   Yes [provider]  venlafaxine  XR (EFFEXOR -XR) 75 MG 24 hr capsule Take 2 capsules (150 mg total) by mouth daily. 08/20/24  Yes Antonio Cyndee Rockers JONELLE, DO    Allergies: Zanaflex  [tizanidine ], Adhesive [tape], and Latex    Review of Systems  Respiratory:  Positive for shortness of breath.     Updated Vital Signs BP (!) 155/89   Pulse (!) 115   Temp 99 F (37.2 C) (Axillary)   Resp (!) 25   Ht 5' 5 (1.651 m)   Wt 61.4 kg   SpO2 96%   BMI 22.53 kg/m   Physical Exam Vitals and nursing note reviewed.  Cardiovascular:     Rate and Rhythm: Normal rate.  Pulmonary:     Effort: Pulmonary effort is normal.     Breath sounds: No decreased breath sounds, wheezing or rhonchi.  Abdominal:     Palpations: Abdomen is soft. There is no mass.     Tenderness: There is abdominal tenderness. There is rebound.  Musculoskeletal:        General: Normal range of motion.  Neurological:     General: No focal deficit present.     Mental Status: She is alert.     (all labs ordered are listed, but only abnormal  results are displayed) Labs Reviewed  CULTURE, BLOOD (ROUTINE X 2) - Abnormal; Notable for the following components:      Result Value   Culture   (*)    Value: STAPHYLOCOCCUS EPIDERMIDIS THE SIGNIFICANCE OF ISOLATING THIS ORGANISM FROM A SINGLE SET OF BLOOD CULTURES WHEN MULTIPLE SETS ARE DRAWN IS UNCERTAIN. PLEASE NOTIFY THE MICROBIOLOGY DEPARTMENT WITHIN ONE WEEK IF SPECIATION AND SENSITIVITIES ARE REQUIRED. Performed at [facility], [address] [place], [place] [address]    All other components within normal limits  MRSA NEXT GEN BY PCR, NASAL - Abnormal; Notable for the following components:   MRSA by PCR Next Gen DETECTED (*)    All other components within normal limits  MRSA NEXT GEN BY PCR, NASAL - Abnormal; Notable for the following components:   MRSA by PCR Next Gen DETECTED (*)     All other components within normal limits  BLOOD CULTURE ID PANEL (REFLEXED) - BCID2 - Abnormal; Notable for the following components:   Staphylococcus species DETECTED (*)    Staphylococcus epidermidis DETECTED (*)    Methicillin resistance mecA/C DETECTED (*)    All other components within normal limits  COMPREHENSIVE METABOLIC PANEL WITH GFR - Abnormal; Notable for the following components:   CO2 16 (*)    Glucose, Bld 61 (*)    BUN 39 (*)    Creatinine, Ser 2.53 (*)    AST 63 (*)    GFR, Estimated 19 (*)    Anion gap 20 (*)    All other components within normal limits  CBC WITH DIFFERENTIAL/PLATELET - Abnormal; Notable for the following components:   MCV 110.4 (*)    MCH 34.1 (*)    RDW 16.9 (*)    Abs Immature Granulocytes 0.24 (*)    All other components within normal limits  PROTIME-INR - Abnormal; Notable for the following components:   Prothrombin Time 17.5 (*)    INR 1.4 (*)    All other components within normal limits  BLOOD GAS, ARTERIAL - Abnormal; Notable for the following components:   pH, Arterial 7.16 (*)    pO2, Arterial 295 (*)    Bicarbonate 15.0 (*)    Acid-base deficit 13.4 (*)    All other components within normal limits  CBC WITH DIFFERENTIAL/PLATELET - Abnormal; Notable for the following components:   RBC 3.40 (*)    Hemoglobin 11.5 (*)    MCV 110.3 (*)    RDW 16.9 (*)    Platelets 142 (*)    Lymphs Abs 0.2 (*)    Abs Immature Granulocytes 0.34 (*)    All other components within normal limits  LACTIC ACID, PLASMA - Abnormal; Notable for the following components:   Lactic Acid, Venous 4.6 (*)    All other components within normal limits  LACTIC ACID, PLASMA - Abnormal; Notable for the following components:   Lactic Acid, Venous 4.7 (*)    All other components within normal limits  COMPREHENSIVE METABOLIC PANEL WITH GFR - Abnormal; Notable for the following components:   CO2 15 (*)    Glucose, Bld 39 (*)    BUN 37 (*)    Creatinine, Ser 2.17  (*)    Calcium 8.5 (*)    Total Protein 6.1 (*)    Albumin  3.1 (*)    AST 126 (*)    GFR, Estimated 23 (*)    Anion gap 18 (*)    All other components within normal limits  PHOSPHORUS - Abnormal;  Notable for the following components:   Phosphorus 4.7 (*)    All other components within normal limits  CBC - Abnormal; Notable for the following components:   WBC 3.7 (*)    RBC 2.95 (*)    Hemoglobin 10.1 (*)    HCT 33.3 (*)    MCV 112.9 (*)    MCH 34.2 (*)    RDW 17.1 (*)    All other components within normal limits  BASIC METABOLIC PANEL WITH GFR - Abnormal; Notable for the following components:   CO2 15 (*)    BUN 35 (*)    Creatinine, Ser 2.32 (*)    Calcium 8.2 (*)    GFR, Estimated 21 (*)    Anion gap 17 (*)    All other components within normal limits  BLOOD GAS, ARTERIAL - Abnormal; Notable for the following components:   pH, Arterial 7.3 (*)    pO2, Arterial 146 (*)    Bicarbonate 18.3 (*)    Acid-base deficit 7.7 (*)    All other components within normal limits  GLUCOSE, CAPILLARY - Abnormal; Notable for the following components:   Glucose-Capillary 57 (*)    All other components within normal limits  GLUCOSE, CAPILLARY - Abnormal; Notable for the following components:   Glucose-Capillary 122 (*)    All other components within normal limits  BASIC METABOLIC PANEL WITH GFR - Abnormal; Notable for the following components:   CO2 21 (*)    Glucose, Bld 104 (*)    BUN 31 (*)    Creatinine, Ser 1.86 (*)    Calcium 8.6 (*)    GFR, Estimated 28 (*)    All other components within normal limits  GLUCOSE, CAPILLARY - Abnormal; Notable for the following components:   Glucose-Capillary 107 (*)    All other components within normal limits  CBC WITH DIFFERENTIAL/PLATELET - Abnormal; Notable for the following components:   RBC 3.48 (*)    Hemoglobin 11.9 (*)    HCT 35.0 (*)    MCV 100.6 (*)    MCH 34.2 (*)    RDW 16.5 (*)    Platelets 94 (*)    nRBC 1.1 (*)    Lymphs  Abs 0.3 (*)    Abs Immature Granulocytes 1.39 (*)    All other components within normal limits  BASIC METABOLIC PANEL WITH GFR - Abnormal; Notable for the following components:   BUN 27 (*)    Creatinine, Ser 1.44 (*)    Calcium 8.8 (*)    GFR, Estimated 38 (*)    All other components within normal limits  CBC - Abnormal; Notable for the following components:   RBC 3.31 (*)    Hemoglobin 11.3 (*)    HCT 34.1 (*)    MCV 103.0 (*)    MCH 34.1 (*)    RDW 16.4 (*)    Platelets 128 (*)    nRBC 0.7 (*)    All other components within normal limits  GLUCOSE, CAPILLARY - Abnormal; Notable for the following components:   Glucose-Capillary 117 (*)    All other components within normal limits  I-STAT CG4 LACTIC ACID, ED - Abnormal; Notable for the following components:   Lactic Acid, Venous 4.4 (*)    All other components within normal limits  I-STAT CHEM 8, ED - Abnormal; Notable for the following components:   Potassium 5.5 (*)    BUN 64 (*)    Creatinine, Ser 2.90 (*)    Glucose, Bld 58 (*)  Calcium, Ion 1.13 (*)    TCO2 19 (*)    Hemoglobin 15.3 (*)    All other components within normal limits  I-STAT CG4 LACTIC ACID, ED - Abnormal; Notable for the following components:   Lactic Acid, Venous 2.8 (*)    All other components within normal limits  TROPONIN T, HIGH SENSITIVITY - Abnormal; Notable for the following components:   Troponin T High Sensitivity 21 (*)    All other components within normal limits  RESP PANEL BY RT-PCR (RSV, FLU A&B, COVID)  RVPGX2  CULTURE, BLOOD (ROUTINE X 2)  MAGNESIUM   MAGNESIUM   PHOSPHORUS  HEMOGLOBIN A1C  GLUCOSE, CAPILLARY  GLUCOSE, CAPILLARY  TRIGLYCERIDES  MAGNESIUM   GLUCOSE, CAPILLARY  GLUCOSE, CAPILLARY  URINALYSIS, W/ REFLEX TO CULTURE (INFECTION SUSPECTED)  BLOOD GAS, ARTERIAL  CBC  I-STAT CG4 LACTIC ACID, ED  I-STAT CG4 LACTIC ACID, ED  I-STAT CG4 LACTIC ACID, ED  I-STAT CG4 LACTIC ACID, ED  TYPE AND SCREEN    EKG: EKG  Interpretation Date/Time:  Friday November 02 2024 20:02:13 EST Ventricular Rate:  109 PR Interval:  151 QRS Duration:  90 QT Interval:  322 QTC Calculation: 434 R Axis:   -14  Text Interpretation: Sinus tachycardia Probable left atrial enlargement Consider anterior infarct Confirmed by Midge Golas ([id number]) on 11/03/2024 3:04:41 PM  Radiology:    .Critical Care  Performed by: Mannie Fairy DASEN, DO Authorized by: Mannie Fairy DASEN, DO   Critical care provider statement:    Critical care time (minutes):  35   Critical care was necessary to treat or prevent imminent or life-threatening deterioration of the following conditions:  Shock (perforated viscous)   Critical care was time spent personally by me on the following activities:  Development of treatment plan with patient or surrogate, discussions with consultants, evaluation of patient's response to treatment, examination of patient, ordering and review of laboratory studies, ordering and review of radiographic studies, ordering and performing treatments and interventions, pulse oximetry, re-evaluation of patient's condition and review of old charts    Medications Ordered in the ED  enoxaparin  (LOVENOX ) injection 30 mg (30 mg Subcutaneous Given 11/04/24 1025)  0.9 %  sodium chloride  infusion ( Intravenous Stopped 11/04/24 1133)  morphine  (PF) 2 MG/ML injection 1-4 mg (4 mg Intravenous Given 11/04/24 1157)  ondansetron  (ZOFRAN -ODT) disintegrating tablet 4 mg (has no administration in time range)    Or  ondansetron  (ZOFRAN ) injection 4 mg (has no administration in time range)  polyethylene glycol (MIRALAX  / GLYCOLAX ) packet 17 g (has no administration in time range)  docusate (COLACE) 50 MG/5ML liquid 100 mg (has no administration in time range)  ondansetron  (ZOFRAN ) injection 4 mg (has no administration in time range)  insulin  aspart (novoLOG ) injection 0-9 Units ( Subcutaneous Not Given 11/04/24 0800)  Oral care mouth rinse  (has no administration in time range)  linezolid  (ZYVOX ) IVPB 600 mg (0 mg Intravenous Stopped 11/04/24 1136)  pantoprazole  (PROTONIX ) injection 40 mg (40 mg Intravenous Given 11/04/24 1032)  0.9 %  sodium chloride  infusion ( Intravenous Canceled Entry 11/03/24 1201)  hydrALAZINE  (APRESOLINE ) injection 10 mg (10 mg Intravenous Given 11/04/24 1106)  fentaNYL  (SUBLIMAZE ) injection 25-50 mcg (50 mcg Intravenous Given 11/04/24 1101)  mupirocin  ointment (BACTROBAN ) 2 % 1 Application (1 Application Nasal Given 11/04/24 1019)  Chlorhexidine  Gluconate Cloth 2 % PADS 6 each (6 each Topical Given 11/03/24 2338)  piperacillin -tazobactam (ZOSYN ) IVPB 3.375 g (3.375 g Intravenous New Bag/Given 11/04/24 1507)  Oral care mouth rinse (has no administration  in time range)  midazolam  PF (VERSED ) injection 1 mg (has no administration in time range)  iohexol  (OMNIPAQUE ) 350 MG/ML injection 100 mL (75 mLs Intravenous Contrast Given 11/02/24 2021)  lactated ringers  bolus 1,000 mL (0 mLs Intravenous Stopping previously hung infusion 11/03/24 2000)    And  lactated ringers  bolus 1,000 mL (0 mLs Intravenous Stopped 11/03/24 0237)  ceFEPIme  (MAXIPIME ) 2 g in sodium chloride  0.9 % 100 mL IVPB (0 g Intravenous Stopped 11/02/24 2125)  metroNIDAZOLE  (FLAGYL ) IVPB 500 mg (0 mg Intravenous Stopping previously hung infusion 11/03/24 2000)  fentaNYL  (SUBLIMAZE ) injection 25 mcg (25 mcg Intravenous Given 11/02/24 2151)  chlorhexidine  (PERIDEX ) 0.12 % solution 15 mL (15 mLs Mouth/Throat Given 11/02/24 2312)  Chlorhexidine  Gluconate Cloth 2 % PADS 6 each (6 each Topical Given 11/02/24 2313)  sodium bicarbonate  injection 100 mEq (100 mEq Intravenous Given 11/03/24 0334)  dextrose  50 % solution 12.5 g (12.5 g Intravenous Given 11/03/24 0433)  lactated ringers  bolus 500 mL (0 mLs Intravenous Stopping previously hung infusion 11/03/24 2000)  lactated ringers  bolus 1,000 mL (0 mLs Intravenous Stopping previously hung infusion 11/03/24  2000)  acetaminophen  (OFIRMEV ) IV 1,000 mg (0 mg Intravenous Stopped 11/03/24 1624)  HYDROmorphone  (DILAUDID ) injection 0.5 mg (0.5 mg Intravenous Given 11/04/24 1152)  midazolam  PF (VERSED ) injection 2 mg (2 mg Intravenous Given 11/04/24 1147)  midazolam  (VERSED ) 2 MG/2ML injection (  Duplicate 11/04/24 1153)                                    Medical Decision Making 78 year old female here today with abdominal pain of 2 days duration.  Plan -upon arrival to the emergency room patient was quite uncomfortable appearing.  Did have concern for potential aortic dissection versus obstruction, versus perforation.  Obtained by past imaging of the patient's abdomen and pelvis which did reveal perforated viscus.  Appears to be at the gastrojejunostomy junction.  Received call from radiologist.  Placed consult with general surgery.  Patient is getting fluid resuscitated, receiving broad-spectrum antibiotics.  Patient taken to OR by Dr. Curvin.  Amount and/or Complexity of Data Reviewed Labs: ordered. Radiology: ordered.  Risk Prescription drug management. Decision regarding hospitalization.        Final diagnoses:  Perforated abdominal viscus  Intra-abdominal free air of unknown etiology    ED Discharge Orders     None          Mannie Fairy DASEN, DO 11/04/24 1507  "

## 2024-11-03 ENCOUNTER — Inpatient Hospital Stay (HOSPITAL_COMMUNITY)

## 2024-11-03 ENCOUNTER — Other Ambulatory Visit: Payer: Self-pay

## 2024-11-03 DIAGNOSIS — Z825 Family history of asthma and other chronic lower respiratory diseases: Secondary | ICD-10-CM | POA: Diagnosis not present

## 2024-11-03 DIAGNOSIS — F05 Delirium due to known physiological condition: Secondary | ICD-10-CM | POA: Diagnosis not present

## 2024-11-03 DIAGNOSIS — Z905 Acquired absence of kidney: Secondary | ICD-10-CM | POA: Diagnosis not present

## 2024-11-03 DIAGNOSIS — N189 Chronic kidney disease, unspecified: Secondary | ICD-10-CM | POA: Diagnosis not present

## 2024-11-03 DIAGNOSIS — Z91040 Latex allergy status: Secondary | ICD-10-CM | POA: Diagnosis not present

## 2024-11-03 DIAGNOSIS — Z8249 Family history of ischemic heart disease and other diseases of the circulatory system: Secondary | ICD-10-CM | POA: Diagnosis not present

## 2024-11-03 DIAGNOSIS — J9601 Acute respiratory failure with hypoxia: Secondary | ICD-10-CM

## 2024-11-03 DIAGNOSIS — R451 Restlessness and agitation: Secondary | ICD-10-CM | POA: Diagnosis not present

## 2024-11-03 DIAGNOSIS — D62 Acute posthemorrhagic anemia: Secondary | ICD-10-CM | POA: Diagnosis present

## 2024-11-03 DIAGNOSIS — K255 Chronic or unspecified gastric ulcer with perforation: Secondary | ICD-10-CM | POA: Diagnosis present

## 2024-11-03 DIAGNOSIS — R6520 Severe sepsis without septic shock: Secondary | ICD-10-CM | POA: Diagnosis present

## 2024-11-03 DIAGNOSIS — K631 Perforation of intestine (nontraumatic): Secondary | ICD-10-CM

## 2024-11-03 DIAGNOSIS — N179 Acute kidney failure, unspecified: Secondary | ICD-10-CM

## 2024-11-03 DIAGNOSIS — E1122 Type 2 diabetes mellitus with diabetic chronic kidney disease: Secondary | ICD-10-CM | POA: Diagnosis present

## 2024-11-03 DIAGNOSIS — E872 Acidosis, unspecified: Secondary | ICD-10-CM | POA: Diagnosis present

## 2024-11-03 DIAGNOSIS — E46 Unspecified protein-calorie malnutrition: Secondary | ICD-10-CM

## 2024-11-03 DIAGNOSIS — I129 Hypertensive chronic kidney disease with stage 1 through stage 4 chronic kidney disease, or unspecified chronic kidney disease: Secondary | ICD-10-CM | POA: Diagnosis present

## 2024-11-03 DIAGNOSIS — E876 Hypokalemia: Secondary | ICD-10-CM | POA: Diagnosis present

## 2024-11-03 DIAGNOSIS — F411 Generalized anxiety disorder: Secondary | ICD-10-CM | POA: Diagnosis present

## 2024-11-03 DIAGNOSIS — Z888 Allergy status to other drugs, medicaments and biological substances status: Secondary | ICD-10-CM | POA: Diagnosis not present

## 2024-11-03 DIAGNOSIS — E785 Hyperlipidemia, unspecified: Secondary | ICD-10-CM | POA: Diagnosis present

## 2024-11-03 DIAGNOSIS — M797 Fibromyalgia: Secondary | ICD-10-CM | POA: Diagnosis present

## 2024-11-03 DIAGNOSIS — G9341 Metabolic encephalopathy: Secondary | ICD-10-CM | POA: Diagnosis present

## 2024-11-03 DIAGNOSIS — A419 Sepsis, unspecified organism: Secondary | ICD-10-CM | POA: Diagnosis present

## 2024-11-03 DIAGNOSIS — D696 Thrombocytopenia, unspecified: Secondary | ICD-10-CM

## 2024-11-03 DIAGNOSIS — D649 Anemia, unspecified: Secondary | ICD-10-CM | POA: Diagnosis not present

## 2024-11-03 DIAGNOSIS — K668 Other specified disorders of peritoneum: Secondary | ICD-10-CM | POA: Diagnosis present

## 2024-11-03 DIAGNOSIS — G8929 Other chronic pain: Secondary | ICD-10-CM | POA: Diagnosis not present

## 2024-11-03 DIAGNOSIS — Z91048 Other nonmedicinal substance allergy status: Secondary | ICD-10-CM | POA: Diagnosis not present

## 2024-11-03 LAB — GLUCOSE, CAPILLARY
Glucose-Capillary: 107 mg/dL — ABNORMAL HIGH (ref 70–99)
Glucose-Capillary: 117 mg/dL — ABNORMAL HIGH (ref 70–99)
Glucose-Capillary: 122 mg/dL — ABNORMAL HIGH (ref 70–99)
Glucose-Capillary: 57 mg/dL — ABNORMAL LOW (ref 70–99)
Glucose-Capillary: 80 mg/dL (ref 70–99)
Glucose-Capillary: 82 mg/dL (ref 70–99)
Glucose-Capillary: 82 mg/dL (ref 70–99)

## 2024-11-03 LAB — LACTIC ACID, PLASMA
Lactic Acid, Venous: 4.6 mmol/L (ref 0.5–1.9)
Lactic Acid, Venous: 4.7 mmol/L (ref 0.5–1.9)

## 2024-11-03 LAB — BLOOD CULTURE ID PANEL (REFLEXED) - BCID2

## 2024-11-03 LAB — BASIC METABOLIC PANEL WITH GFR
Anion gap: 17 — ABNORMAL HIGH (ref 5–15)
Anion gap: 15 (ref 5–15)
BUN: 35 mg/dL — ABNORMAL HIGH (ref 8–23)
BUN: 31 mg/dL — ABNORMAL HIGH (ref 8–23)
CO2: 15 mmol/L — ABNORMAL LOW (ref 22–32)
CO2: 21 mmol/L — ABNORMAL LOW (ref 22–32)
Calcium: 8.2 mg/dL — ABNORMAL LOW (ref 8.9–10.3)
Calcium: 8.6 mg/dL — ABNORMAL LOW (ref 8.9–10.3)
Chloride: 108 mmol/L (ref 98–111)
Chloride: 103 mmol/L (ref 98–111)
Creatinine, Ser: 2.32 mg/dL — ABNORMAL HIGH (ref 0.44–1.00)
Creatinine, Ser: 1.86 mg/dL — ABNORMAL HIGH (ref 0.44–1.00)
GFR, Estimated: 21 mL/min — ABNORMAL LOW
GFR, Estimated: 28 mL/min — ABNORMAL LOW
Glucose, Bld: 70 mg/dL (ref 70–99)
Glucose, Bld: 104 mg/dL — ABNORMAL HIGH (ref 70–99)
Potassium: 4.1 mmol/L (ref 3.5–5.1)
Potassium: 4.6 mmol/L (ref 3.5–5.1)
Sodium: 140 mmol/L (ref 135–145)
Sodium: 139 mmol/L (ref 135–145)

## 2024-11-03 LAB — BLOOD GAS, ARTERIAL
Acid-base deficit: 13.4 mmol/L — ABNORMAL HIGH (ref 0.0–2.0)
Acid-base deficit: 7.7 mmol/L — ABNORMAL HIGH (ref 0.0–2.0)
Bicarbonate: 15 mmol/L — ABNORMAL LOW (ref 20.0–28.0)
Bicarbonate: 18.3 mmol/L — ABNORMAL LOW (ref 20.0–28.0)
Drawn by: 74501
Drawn by: 74501
FIO2: 100 %
FIO2: 60 %
MECHVT: 460 mL
MECHVT: 460 mL
O2 Saturation: 100 %
O2 Saturation: 100 %
PEEP: 5 cmH2O
PEEP: 5 cmH2O
Patient temperature: 36.4
Patient temperature: 36.4
RATE: 15 {breaths}/min
RATE: 15 {breaths}/min
pCO2 arterial: 42 mmHg (ref 32–48)
pCO2 arterial: 37 mmHg (ref 32–48)
pH, Arterial: 7.16 — CL (ref 7.35–7.45)
pH, Arterial: 7.3 — ABNORMAL LOW (ref 7.35–7.45)
pO2, Arterial: 295 mmHg — ABNORMAL HIGH (ref 83–108)
pO2, Arterial: 146 mmHg — ABNORMAL HIGH (ref 83–108)

## 2024-11-03 LAB — CBC WITH DIFFERENTIAL/PLATELET
Abs Immature Granulocytes: 0.34 K/uL — ABNORMAL HIGH (ref 0.00–0.07)
Basophils Absolute: 0 K/uL (ref 0.0–0.1)
Basophils Relative: 0 %
Eosinophils Absolute: 0 K/uL (ref 0.0–0.5)
Eosinophils Relative: 0 %
HCT: 37.5 % (ref 36.0–46.0)
Hemoglobin: 11.5 g/dL — ABNORMAL LOW (ref 12.0–15.0)
Immature Granulocytes: 8 %
Lymphocytes Relative: 4 %
Lymphs Abs: 0.2 K/uL — ABNORMAL LOW (ref 0.7–4.0)
MCH: 33.8 pg (ref 26.0–34.0)
MCHC: 30.7 g/dL (ref 30.0–36.0)
MCV: 110.3 fL — ABNORMAL HIGH (ref 80.0–100.0)
Monocytes Absolute: 0.1 K/uL (ref 0.1–1.0)
Monocytes Relative: 3 %
Neutro Abs: 3.9 K/uL (ref 1.7–7.7)
Neutrophils Relative %: 85 %
Platelets: 142 K/uL — ABNORMAL LOW (ref 150–400)
RBC: 3.4 MIL/uL — ABNORMAL LOW (ref 3.87–5.11)
RDW: 16.9 % — ABNORMAL HIGH (ref 11.5–15.5)
WBC: 4.6 K/uL (ref 4.0–10.5)
nRBC: 0 % (ref 0.0–0.2)

## 2024-11-03 LAB — COMPREHENSIVE METABOLIC PANEL WITH GFR
ALT: 22 U/L (ref 0–44)
AST: 126 U/L — ABNORMAL HIGH (ref 15–41)
Albumin: 3.1 g/dL — ABNORMAL LOW (ref 3.5–5.0)
Alkaline Phosphatase: 85 U/L (ref 38–126)
Anion gap: 18 — ABNORMAL HIGH (ref 5–15)
BUN: 37 mg/dL — ABNORMAL HIGH (ref 8–23)
CO2: 15 mmol/L — ABNORMAL LOW (ref 22–32)
Calcium: 8.5 mg/dL — ABNORMAL LOW (ref 8.9–10.3)
Chloride: 109 mmol/L (ref 98–111)
Creatinine, Ser: 2.17 mg/dL — ABNORMAL HIGH (ref 0.44–1.00)
GFR, Estimated: 23 mL/min — ABNORMAL LOW
Glucose, Bld: 39 mg/dL — CL (ref 70–99)
Potassium: 4.4 mmol/L (ref 3.5–5.1)
Sodium: 142 mmol/L (ref 135–145)
Total Bilirubin: 0.7 mg/dL (ref 0.0–1.2)
Total Protein: 6.1 g/dL — ABNORMAL LOW (ref 6.5–8.1)

## 2024-11-03 LAB — CBC
HCT: 33.3 % — ABNORMAL LOW (ref 36.0–46.0)
Hemoglobin: 10.1 g/dL — ABNORMAL LOW (ref 12.0–15.0)
MCH: 34.2 pg — ABNORMAL HIGH (ref 26.0–34.0)
MCHC: 30.3 g/dL (ref 30.0–36.0)
MCV: 112.9 fL — ABNORMAL HIGH (ref 80.0–100.0)
Platelets: 151 K/uL (ref 150–400)
RBC: 2.95 MIL/uL — ABNORMAL LOW (ref 3.87–5.11)
RDW: 17.1 % — ABNORMAL HIGH (ref 11.5–15.5)
WBC: 3.7 K/uL — ABNORMAL LOW (ref 4.0–10.5)
nRBC: 0 % (ref 0.0–0.2)

## 2024-11-03 LAB — TYPE AND SCREEN
ABO/RH(D): A POS
Antibody Screen: NEGATIVE

## 2024-11-03 LAB — MAGNESIUM
Magnesium: 2 mg/dL (ref 1.7–2.4)
Magnesium: 2.1 mg/dL (ref 1.7–2.4)

## 2024-11-03 LAB — PHOSPHORUS
Phosphorus: 4.7 mg/dL — ABNORMAL HIGH (ref 2.5–4.6)
Phosphorus: 4.1 mg/dL (ref 2.5–4.6)

## 2024-11-03 LAB — HEMOGLOBIN A1C
Hgb A1c MFr Bld: 5.2 % (ref 4.8–5.6)
Mean Plasma Glucose: 102.54 mg/dL

## 2024-11-03 LAB — MRSA NEXT GEN BY PCR, NASAL
MRSA by PCR Next Gen: DETECTED — AB
MRSA by PCR Next Gen: DETECTED — AB

## 2024-11-03 MED ORDER — POLYETHYLENE GLYCOL 3350 17 G PO PACK: 17.0000 g | PACK | Freq: Every day | ORAL | Status: DC

## 2024-11-03 MED ORDER — ONDANSETRON HCL 4 MG/2ML IJ SOLN: 4.0000 mg | Freq: Four times a day (QID) | INTRAMUSCULAR | Status: DC | PRN

## 2024-11-03 MED ORDER — SODIUM CHLORIDE 0.9 % IV SOLN: INTRAVENOUS | Status: DC

## 2024-11-03 MED ORDER — DOCUSATE SODIUM 100 MG PO CAPS: 100.0000 mg | ORAL_CAPSULE | Freq: Two times a day (BID) | ORAL | Status: DC | PRN

## 2024-11-03 MED ORDER — PANTOPRAZOLE SODIUM 40 MG IV SOLR: 40.0000 mg | Freq: Every day | INTRAVENOUS | Status: DC

## 2024-11-03 MED ORDER — SODIUM CHLORIDE 0.9 % IV SOLN: 250.0000 mL | INTRAVENOUS | Status: AC

## 2024-11-03 MED ORDER — LINEZOLID 600 MG/300ML IV SOLN
600.0000 mg | Freq: Two times a day (BID) | INTRAVENOUS | Status: DC
  Administered 2024-11-03 – 2024-11-06 (×7): 600 mg via INTRAVENOUS
  Filled 2024-11-03 (×5): qty 300

## 2024-11-03 MED ORDER — ALBUMIN HUMAN 5 % IV SOLN: INTRAVENOUS | Status: DC | PRN

## 2024-11-03 MED ORDER — DEXAMETHASONE SOD PHOSPHATE PF 10 MG/ML IJ SOLN
INTRAMUSCULAR | Status: DC | PRN
  Administered 2024-11-02: 10 mg via INTRAVENOUS

## 2024-11-03 MED ORDER — HYDRALAZINE HCL 20 MG/ML IJ SOLN
10.0000 mg | Freq: Four times a day (QID) | INTRAMUSCULAR | Status: DC | PRN
  Administered 2024-11-03 – 2024-11-08 (×12): 10 mg via INTRAVENOUS
  Filled 2024-11-03 (×5): qty 1

## 2024-11-03 MED ORDER — DOCUSATE SODIUM 50 MG/5ML PO LIQD: 100.0000 mg | Freq: Two times a day (BID) | ORAL | Status: AC | PRN

## 2024-11-03 MED ORDER — CHLORHEXIDINE GLUCONATE CLOTH 2 % EX PADS
6.0000 | MEDICATED_PAD | Freq: Every day | CUTANEOUS | Status: DC
  Administered 2024-11-03 – 2024-11-12 (×8): 6 via TOPICAL

## 2024-11-03 MED ORDER — VANCOMYCIN HCL 750 MG/150ML IV SOLN: 750.0000 mg | INTRAVENOUS | Status: DC

## 2024-11-03 MED ORDER — ROCURONIUM BROMIDE 100 MG/10ML IV SOLN
INTRAVENOUS | Status: DC | PRN
  Administered 2024-11-02: 50 mg via INTRAVENOUS
  Administered 2024-11-03: 20 mg via INTRAVENOUS

## 2024-11-03 MED ORDER — ONDANSETRON 4 MG PO TBDP: 4.0000 mg | ORAL_TABLET | Freq: Four times a day (QID) | ORAL | Status: AC | PRN

## 2024-11-03 MED ORDER — ONDANSETRON HCL 4 MG/2ML IJ SOLN: 4.0000 mg | Freq: Four times a day (QID) | INTRAMUSCULAR | Status: AC | PRN

## 2024-11-03 MED ORDER — NOREPINEPHRINE 4 MG/250ML-% IV SOLN
0.0000 ug/min | INTRAVENOUS | Status: DC
  Administered 2024-11-03: 2 ug/min via INTRAVENOUS
  Filled 2024-11-03: qty 250

## 2024-11-03 MED ORDER — CHLORHEXIDINE GLUCONATE CLOTH 2 % EX PADS: 6.0000 | MEDICATED_PAD | Freq: Every day | CUTANEOUS | Status: DC

## 2024-11-03 MED ORDER — FENTANYL CITRATE (PF) 50 MCG/ML IJ SOSY
25.0000 ug | PREFILLED_SYRINGE | INTRAMUSCULAR | Status: DC | PRN
  Administered 2024-11-03 – 2024-11-06 (×11): 50 ug via INTRAVENOUS
  Filled 2024-11-03 (×5): qty 1

## 2024-11-03 MED ORDER — FENTANYL CITRATE (PF) 100 MCG/2ML IJ SOLN
INTRAMUSCULAR | Status: DC | PRN
  Administered 2024-11-02 – 2024-11-03 (×2): 50 ug via INTRAVENOUS

## 2024-11-03 MED ORDER — PHENYLEPHRINE 80 MCG/ML (10ML) SYRINGE FOR IV PUSH (FOR BLOOD PRESSURE SUPPORT)
PREFILLED_SYRINGE | INTRAVENOUS | Status: DC | PRN
  Administered 2024-11-02: 80 ug via INTRAVENOUS
  Administered 2024-11-02: 160 ug via INTRAVENOUS

## 2024-11-03 MED ORDER — ORAL CARE MOUTH RINSE: 15.0000 mL | OROMUCOSAL | Status: DC | PRN

## 2024-11-03 MED ORDER — PROPOFOL 1000 MG/100ML IV EMUL
0.0000 ug/kg/min | INTRAVENOUS | Status: DC
  Administered 2024-11-03: 10 ug/kg/min via INTRAVENOUS
  Filled 2024-11-03: qty 100

## 2024-11-03 MED ORDER — METRONIDAZOLE 500 MG/100ML IV SOLN
500.0000 mg | Freq: Two times a day (BID) | INTRAVENOUS | Status: DC
  Administered 2024-11-03: 500 mg via INTRAVENOUS
  Filled 2024-11-03: qty 100

## 2024-11-03 MED ORDER — DEXTROSE 50 % IV SOLN
12.5000 g | INTRAVENOUS | Status: AC
  Administered 2024-11-03: 12.5 g via INTRAVENOUS
  Filled 2024-11-03: qty 50

## 2024-11-03 MED ORDER — CHLORHEXIDINE GLUCONATE CLOTH 2 % EX PADS
6.0000 | MEDICATED_PAD | Freq: Every day | CUTANEOUS | Status: DC
  Administered 2024-11-03: 6 via TOPICAL

## 2024-11-03 MED ORDER — PANTOPRAZOLE SODIUM 40 MG IV SOLR
40.0000 mg | Freq: Two times a day (BID) | INTRAVENOUS | Status: DC
  Administered 2024-11-03 – 2024-11-07 (×10): 40 mg via INTRAVENOUS
  Filled 2024-11-03 (×4): qty 10

## 2024-11-03 MED ORDER — DOCUSATE SODIUM 50 MG/5ML PO LIQD: 100.0000 mg | Freq: Two times a day (BID) | ORAL | Status: DC

## 2024-11-03 MED ORDER — PHENYLEPHRINE HCL-NACL 20-0.9 MG/250ML-% IV SOLN
INTRAVENOUS | Status: DC | PRN
  Administered 2024-11-03: 80 ug/min via INTRAVENOUS

## 2024-11-03 MED ORDER — 0.9 % SODIUM CHLORIDE (POUR BTL) OPTIME
TOPICAL | Status: DC | PRN
  Administered 2024-11-03: 2000 mL

## 2024-11-03 MED ORDER — MORPHINE SULFATE (PF) 2 MG/ML IV SOLN
1.0000 mg | INTRAVENOUS | Status: DC | PRN
  Administered 2024-11-03: 2 mg via INTRAVENOUS
  Administered 2024-11-03 – 2024-11-04 (×4): 4 mg via INTRAVENOUS
  Administered 2024-11-04 (×3): 2 mg via INTRAVENOUS
  Administered 2024-11-04 – 2024-11-05 (×3): 4 mg via INTRAVENOUS
  Administered 2024-11-05: 2 mg via INTRAVENOUS
  Administered 2024-11-05 – 2024-11-07 (×5): 4 mg via INTRAVENOUS
  Administered 2024-11-07 – 2024-11-12 (×9): 2 mg via INTRAVENOUS
  Administered 2024-11-12: 1 mg via INTRAVENOUS
  Filled 2024-11-03 (×5): qty 1
  Filled 2024-11-03 (×2): qty 2
  Filled 2024-11-03: qty 1
  Filled 2024-11-03 (×3): qty 2

## 2024-11-03 MED ORDER — HYDROMORPHONE HCL 1 MG/ML IJ SOLN
0.5000 mg | INTRAMUSCULAR | Status: AC | PRN
  Administered 2024-11-03 – 2024-11-04 (×3): 0.5 mg via INTRAVENOUS
  Filled 2024-11-03 (×4): qty 1

## 2024-11-03 MED ORDER — ACETAMINOPHEN 10 MG/ML IV SOLN
1000.0000 mg | Freq: Once | INTRAVENOUS | Status: AC
  Administered 2024-11-03: 1000 mg via INTRAVENOUS
  Filled 2024-11-03: qty 100

## 2024-11-03 MED ORDER — SODIUM BICARBONATE 8.4 % IV SOLN
INTRAVENOUS | Status: DC
  Filled 2024-11-03: qty 1000
  Filled 2024-11-03 (×2): qty 150

## 2024-11-03 MED ORDER — FENTANYL BOLUS VIA INFUSION
25.0000 ug | INTRAVENOUS | Status: DC | PRN
  Administered 2024-11-03 (×2): 25 ug via INTRAVENOUS

## 2024-11-03 MED ORDER — VANCOMYCIN HCL IN DEXTROSE 1-5 GM/200ML-% IV SOLN: 1000.0000 mg | Freq: Once | INTRAVENOUS | Status: DC

## 2024-11-03 MED ORDER — SODIUM BICARBONATE 8.4 % IV SOLN
100.0000 meq | Freq: Once | INTRAVENOUS | Status: AC
  Administered 2024-11-03: 100 meq via INTRAVENOUS
  Filled 2024-11-03: qty 100

## 2024-11-03 MED ORDER — PROPOFOL 10 MG/ML IV BOLUS
INTRAVENOUS | Status: DC | PRN
  Administered 2024-11-02: 100 mg via INTRAVENOUS

## 2024-11-03 MED ORDER — LIDOCAINE HCL (CARDIAC) PF 100 MG/5ML IV SOSY
PREFILLED_SYRINGE | INTRAVENOUS | Status: DC | PRN
  Administered 2024-11-02: 40 mg via INTRAVENOUS

## 2024-11-03 MED ORDER — LACTATED RINGERS IV BOLUS
1000.0000 mL | Freq: Once | INTRAVENOUS | Status: AC
  Administered 2024-11-03: 1000 mL via INTRAVENOUS

## 2024-11-03 MED ORDER — FENTANYL 2500MCG IN NS 250ML (10MCG/ML) PREMIX INFUSION
0.0000 ug/h | INTRAVENOUS | Status: DC
  Administered 2024-11-03: 50 ug/h via INTRAVENOUS
  Filled 2024-11-03: qty 250

## 2024-11-03 MED ORDER — PIPERACILLIN-TAZOBACTAM 3.375 G IVPB
3.3750 g | Freq: Three times a day (TID) | INTRAVENOUS | Status: DC
  Administered 2024-11-03 – 2024-11-07 (×11): 3.375 g via INTRAVENOUS
  Filled 2024-11-03 (×4): qty 50

## 2024-11-03 MED ORDER — INSULIN ASPART 100 UNIT/ML IJ SOLN
0.0000 [IU] | INTRAMUSCULAR | Status: DC
  Administered 2024-11-03: 1 [IU] via SUBCUTANEOUS
  Administered 2024-11-04: 2 [IU] via SUBCUTANEOUS
  Administered 2024-11-04 – 2024-11-08 (×5): 1 [IU] via SUBCUTANEOUS
  Filled 2024-11-03: qty 2
  Filled 2024-11-03: qty 1

## 2024-11-03 MED ORDER — ORAL CARE MOUTH RINSE
15.0000 mL | OROMUCOSAL | Status: DC
  Administered 2024-11-03 (×2): 15 mL via OROMUCOSAL

## 2024-11-03 MED ORDER — ALBUMIN HUMAN 5 % IV SOLN
INTRAVENOUS | Status: AC
  Filled 2024-11-03: qty 250

## 2024-11-03 MED ORDER — LACTATED RINGERS IV BOLUS
500.0000 mL | Freq: Once | INTRAVENOUS | Status: AC
  Administered 2024-11-03: 500 mL via INTRAVENOUS

## 2024-11-03 MED ORDER — PANTOPRAZOLE SODIUM 40 MG IV SOLR: 40.0000 mg | Freq: Two times a day (BID) | INTRAVENOUS | Status: DC

## 2024-11-03 MED ORDER — LACTATED RINGERS IV SOLN: INTRAVENOUS | Status: DC | PRN

## 2024-11-03 MED ORDER — PIPERACILLIN-TAZOBACTAM IN DEX 2-0.25 GM/50ML IV SOLN
2.2500 g | Freq: Three times a day (TID) | INTRAVENOUS | Status: DC
  Administered 2024-11-03 (×2): 2.25 g via INTRAVENOUS
  Filled 2024-11-03 (×4): qty 50

## 2024-11-03 MED ORDER — ENOXAPARIN SODIUM 30 MG/0.3ML IJ SOSY
30.0000 mg | PREFILLED_SYRINGE | INTRAMUSCULAR | Status: DC
  Administered 2024-11-04 – 2024-11-05 (×2): 30 mg via SUBCUTANEOUS
  Filled 2024-11-03: qty 0.3

## 2024-11-03 MED ORDER — POLYETHYLENE GLYCOL 3350 17 G PO PACK: 17.0000 g | PACK | Freq: Every day | ORAL | Status: DC | PRN

## 2024-11-03 MED ORDER — MUPIROCIN 2 % EX OINT
1.0000 | TOPICAL_OINTMENT | Freq: Two times a day (BID) | CUTANEOUS | Status: AC
  Administered 2024-11-03 – 2024-11-08 (×10): 1 via NASAL
  Filled 2024-11-03: qty 22

## 2024-11-03 MED ORDER — LACTATED RINGERS IV SOLN: INTRAVENOUS | Status: DC

## 2024-11-03 NOTE — Progress Notes (Signed)
 "  NAME:  [name], MRN:  [id number], DOB:  [birth year], LOS: 0 ADMISSION DATE:  11/02/2024, CONSULTATION DATE:  11/03/2024 REFERRING MD:  Deward Null III, MD, CHIEF COMPLAINT:  Abdominal pain   History of Present Illness:  78 y/o female with PMH for HTN, Tremor, CKD, Malnutrition, Depression, One Kidney, Spinal cord stimulator, forward flexed neck who presented with abd pain.  Appararently agitated and altered. CT abd showing large amount of free air consistent wiuth bowel perforation.  She was emergently taken to the OR.  She underwent an ex-lap and found to have perforation at the gastrojejunostomy.  She had an omental patch repair.  Pertinent  Medical History   HTN, Tremor, CKD, Malnutrition, Depression, One Kidney, Spinal cord stimulator, forward flexed neck  Significant Hospital Events: Including procedures, antibiotic start and stop dates in addition to other pertinent events   12/27: admit to ICU post op  Interim History / Subjective:  N/A  Objective    Blood pressure (!) 83/60, pulse 67, temperature 98.9 F (37.2 C), temperature source Oral, resp. rate 15, height 5' 5 (1.651 m), weight 61.4 kg, SpO2 92%.    Vent Mode: PRVC FiO2 (%):  [40 %-100 %] 40 % Set Rate:  [15 bmp] 15 bmp Vt Set:  [460 mL] 460 mL PEEP:  [5 cmH20] 5 cmH20 Plateau Pressure:  [21 cmH20-25 cmH20] 25 cmH20   Intake/Output Summary (Last 24 hours) at 11/03/2024 0951 Last data filed at 11/03/2024 0940 Gross per 24 hour  Intake 4119.13 ml  Output 460 ml  Net 3659.13 ml   Filed Weights   11/02/24 2100 11/03/24 0437  Weight: 61.4 kg 61.4 kg    Examination: General: intubated, lightly sedated HENT: [place]/AT, moist mucous membranes Lungs: clear to auscultation, no wheezes, no rales Cardiovascular: Reg s1s2 no murmurs Abdomen: post op, JP drain - serosanguinous, no BS Extremities: no edema or clubbing or cyanosis Neuro: PERRL, following commands   Resolved problem list   Assessment and Plan   Acute hypoxic respiratory failure - Continue Vent support - SAT/SBT this AM - Follow ABGs/VBGs/O2 sats  Small bowel perforation  - S/p ex-lap and omental patch  - Abd closed with JP drain - Analgesia  Sepsis due to acute abdomen - continue zyvox , zosyn  - follow up cultures - give LR 1L bolus this AM  AKI on CKD AGMA Lactic Acidosis - She apparently has 1 kidney  - Trend lactic acid - Continue bicarb drip, trend BMP. Will likely stop bicarb drip this afternoon - Give 1L LR bolus this AM - Avoid nephrotoxic agents  HTN - Prn IV meds until po  Malnutrition - tube feeds when cleared by surgery   Labs   CBC: Recent Labs  Lab 11/02/24 2013 11/02/24 2016 11/03/24 0339 11/03/24 0539  WBC 8.5  --  4.6 3.7*  NEUTROABS 7.1  --  3.9  --   HGB 13.8 15.3* 11.5* 10.1*  HCT 44.7 45.0 37.5 33.3*  MCV 110.4*  --  110.3* 112.9*  PLT 241  --  142* 151    Basic Metabolic Panel: Recent Labs  Lab 11/02/24 2013 11/02/24 2016 11/03/24 0339 11/03/24 0539  NA 142 139 142 140  K 4.2 5.5* 4.4 4.1  CL 106 110 109 108  CO2 16*  --  15* 15*  GLUCOSE 61* 58* 39* 70  BUN 39* 64* 37* 35*  CREATININE 2.53* 2.90* 2.17* 2.32*  CALCIUM 9.2  --  8.5* 8.2*  MG  --   --  2.0 2.1  PHOS  --   --  4.7* 4.1   GFR: Estimated Creatinine Clearance: 18.6 mL/min (A) (by C-G formula based on SCr of 2.32 mg/dL (H)). Recent Labs  Lab 11/02/24 2013 11/02/24 2015 11/02/24 2202 11/03/24 0339 11/03/24 0539  WBC 8.5  --   --  4.6 3.7*  LATICACIDVEN  --  4.4* 2.8* 4.6* 4.7*    Liver Function Tests: Recent Labs  Lab 11/02/24 2013 11/03/24 0339  AST 63* 126*  ALT 13 22  ALKPHOS 105 85  BILITOT 0.6 0.7  PROT 7.9 6.1*  ALBUMIN  4.0 3.1*   No results for input(s): LIPASE, AMYLASE in the last 168 hours. No results for input(s): AMMONIA in the last 168 hours.  ABG    Component Value Date/Time   PHART 7.3 (L) 11/03/2024 0439   PCO2ART 37 11/03/2024 0439   PO2ART 146 (H)  11/03/2024 0439   HCO3 18.3 (L) 11/03/2024 0439   TCO2 19 (L) 11/02/2024 2016   ACIDBASEDEF 7.7 (H) 11/03/2024 0439   O2SAT 100 11/03/2024 0439     Coagulation Profile: Recent Labs  Lab 11/02/24 2153  INR 1.4*    Cardiac Enzymes: No results for input(s): CKTOTAL, CKMB, CKMBINDEX, TROPONINI in the last 168 hours.  HbA1C: Hgb A1c MFr Bld  Date/Time Value Ref Range Status  11/03/2024 03:39 AM 5.2 4.8 - 5.6 % Final    Comment:    (NOTE) Diagnosis of Diabetes The following HbA1c ranges recommended by the American Diabetes Association (ADA) may be used as an aid in the diagnosis of diabetes mellitus.  Hemoglobin             Suggested A1C NGSP%              Diagnosis  <5.7                   Non Diabetic  5.7-6.4                Pre-Diabetic  >6.4                   Diabetic  <7.0                   Glycemic control for                       adults with diabetes.    04/22/2014 11:53 AM 5.3 4.6 - 6.5 % Final    Comment:    Glycemic Control Guidelines for People with Diabetes:Non Diabetic:  <6%Goal of Therapy: <7%Additional Action Suggested:  >8%     CBG: Recent Labs  Lab 11/03/24 0414 11/03/24 0518 11/03/24 0820  GLUCAP 57* 82 122*       Critical care time: 40   The patient is critically ill with multiple organ system failure and requires high complexity decision making for assessment and support, frequent evaluation and titration of therapies, advanced monitoring, review of radiographic studies and interpretation of complex data.   Critical Care Time devoted to patient care services, exclusive of separately billable procedures, described in this note is 40 minutes.   Dorn Chill, MD [facility] Office: [phone]   See Amion for personal pager PCCM on call pager [phone] until 7pm. Please call Elink 7p-7a. [phone]          "

## 2024-11-03 NOTE — Progress Notes (Signed)
 PHARMACY - PHYSICIAN COMMUNICATION CRITICAL VALUE ALERT - BLOOD CULTURE IDENTIFICATION (BCID)  [name] is an 78 y.o. female who presented to [facility] on 11/02/2024 with a chief complaint of confusion and shortness of breath. Imaging revealed bowel perforation.  Assessment:   Bcx 1/3 bottles staph epi, mecA/C detected Broad spectrum abx for sepsis Small bowel perforation s/p ex lap with omental patch Extubated, off vasopressors  Name of physician (or Provider) Contacted: Dr. Marion  Current antibiotics: linezolid , Zosyn   Changes to prescribed antibiotics recommended: none, suspect blood culture is contaminant given concern for intra-abdominal infection but linezolid  should provide coverage at this time  Results for orders placed or performed during the hospital encounter of 11/02/24  Blood Culture ID Panel (Reflexed) (Collected: 11/02/2024  8:31 PM)  Result Value Ref Range   Enterococcus faecalis NOT DETECTED NOT DETECTED   Enterococcus Faecium NOT DETECTED NOT DETECTED   Listeria monocytogenes NOT DETECTED NOT DETECTED   Staphylococcus species DETECTED (A) NOT DETECTED   Staphylococcus aureus (BCID) NOT DETECTED NOT DETECTED   Staphylococcus epidermidis DETECTED (A) NOT DETECTED   Staphylococcus lugdunensis NOT DETECTED NOT DETECTED   Streptococcus species NOT DETECTED NOT DETECTED   Streptococcus agalactiae NOT DETECTED NOT DETECTED   Streptococcus pneumoniae NOT DETECTED NOT DETECTED   Streptococcus pyogenes NOT DETECTED NOT DETECTED   A.calcoaceticus-baumannii NOT DETECTED NOT DETECTED   Bacteroides fragilis NOT DETECTED NOT DETECTED   Enterobacterales NOT DETECTED NOT DETECTED   Enterobacter cloacae complex NOT DETECTED NOT DETECTED   Escherichia coli NOT DETECTED NOT DETECTED   Klebsiella aerogenes NOT DETECTED NOT DETECTED   Klebsiella oxytoca NOT DETECTED NOT DETECTED   Klebsiella pneumoniae NOT DETECTED NOT DETECTED   Proteus species NOT DETECTED NOT DETECTED    Salmonella species NOT DETECTED NOT DETECTED   Serratia marcescens NOT DETECTED NOT DETECTED   Haemophilus influenzae NOT DETECTED NOT DETECTED   Neisseria meningitidis NOT DETECTED NOT DETECTED   Pseudomonas aeruginosa NOT DETECTED NOT DETECTED   Stenotrophomonas maltophilia NOT DETECTED NOT DETECTED   Candida albicans NOT DETECTED NOT DETECTED   Candida auris NOT DETECTED NOT DETECTED   Candida glabrata NOT DETECTED NOT DETECTED   Candida krusei NOT DETECTED NOT DETECTED   Candida parapsilosis NOT DETECTED NOT DETECTED   Candida tropicalis NOT DETECTED NOT DETECTED   Cryptococcus neoformans/gattii NOT DETECTED NOT DETECTED   Methicillin resistance mecA/C DETECTED (A) NOT DETECTED    Stefano MARLA Bologna, PharmD, BCPS Clinical Pharmacist 11/03/2024 7:32 PM

## 2024-11-03 NOTE — Op Note (Signed)
 11/02/2024 - 11/03/2024  12:51 AM  PATIENT:  [name]  78 y.o. female  PRE-OPERATIVE DIAGNOSIS:  bowel perforation  POST-OPERATIVE DIAGNOSIS:  perforation at gastrojejunostomy  PROCEDURE:  Procedures: LAPAROTOMY, EXPLORATORY AND OMENTAL PATCH REPAIR OF PERFORATION OF GASTROJEJUNOSTOMY (N/A)  SURGEON:  Surgeons and Role:    * Curvin Deward MOULD, MD - Primary  PHYSICIAN ASSISTANT:   ASSISTANTS: none   ANESTHESIA:   general  EBL:  minimal   BLOOD ADMINISTERED:none  DRAINS: (1) Blake drain(s) in the upper abdomen   LOCAL MEDICATIONS USED:  NONE  SPECIMEN:  No Specimen  DISPOSITION OF SPECIMEN:  N/A  COUNTS:  YES  TOURNIQUET:  * No tourniquets in log *  DICTATION: .Dragon Dictation  After informed consent was obtained the patient was brought to the operating room and placed in the supine position on the operating room table.  After adequate induction of general anesthesia the patient's abdomen was prepped with ChloraPrep, allowed to dry, and draped in usual sterile manner.  An appropriate timeout was performed.  A midline incision was then made with a 10 blade knife.  The incision was carried through the skin and subcutaneous tissue sharply with the electrocautery until the fascia of the anterior abdominal wall was encountered.  The fascia was opened sharply with the electrocautery.  The preperitoneal space was then probed bluntly with a hemostat until the peritoneum was opened and access was gained to the abdominal cavity.  The rest of the incision was then opened under direct vision.  There was not a lot of room to work since the patient was contracted and bent over chronically.  Once into the abdominal cavity I was able to identify cloudy fluid that appeared to be coming from the upper abdomen.  The small bowel in the lower abdomen was somewhat mobile but the small bowel in the upper abdomen seem to be fairly frozen in place.  As I lifted up the left lobe of the liver I was  able to identify the perforation that appeared to be at a previous gastrojejunostomy.  I was then able to free a tongue of omentum and placed it across the hole.  The hole was then closed with 3 interrupted 2-0 silk stitches incorporating the omental tongue.  These stitches were cinched down and tied.  This appeared to give a good patch repair to the perforation.  The abdomen was then irrigated with copious amounts of saline in all 4 quadrants.  I then placed a 19 French round Blake drain through the abdominal wall in the right mid abdomen.  The drain was placed across the repair and into the left upper quadrant.  The drain was anchored to the skin with a 2-0 nylon stitch.  I was not able to completely run the rest of the bowel due to the contracted somewhat frozen nature of the abdomen.  The fascia of the anterior abdominal wall was then closed with 2 running #1 double-stranded looped PDS sutures.  The subcutaneous tissue was irrigated with saline and packed with moistened Kerlix gauze.  Sterile dressings and drain dressings were applied.  The drain was placed to bulb suction and there was a good seal.  The patient tolerated the surgery fairly well although she did have some hypotension during the case.  At the end of the case all needle sponge and instrument counts were correct.  The patient will be left intubated and taken to ICU for further resuscitation measures.  PLAN OF CARE: Admit to  inpatient   PATIENT DISPOSITION:  ICU - intubated and critically ill.   Delay start of Pharmacological VTE agent (>24hrs) due to surgical blood loss or risk of bleeding: no

## 2024-11-03 NOTE — Anesthesia Procedure Notes (Signed)
 Procedure Name: Intubation Date/Time: 11/02/2024 11:35 PM  Performed by: Areatha Glendia HERO, CRNAPre-anesthesia Checklist: Patient identified, Emergency Drugs available, Suction available and Patient being monitored Patient Re-evaluated:Patient Re-evaluated prior to induction Oxygen Delivery Method: Circle System Utilized Preoxygenation: Pre-oxygenation with 100% oxygen Induction Type: IV induction and Rapid sequence Ventilation: Mask ventilation without difficulty Laryngoscope Size: Mac and 3 Grade View: Grade I Tube type: Oral Tube size: 7.0 mm Number of attempts: 1 Airway Equipment and Method: Stylet and Oral airway Placement Confirmation: ETT inserted through vocal cords under direct vision, positive ETCO2 and breath sounds checked- equal and bilateral Secured at: 20 cm Tube secured with: Tape Dental Injury: Teeth and Oropharynx as per pre-operative assessment

## 2024-11-03 NOTE — Progress Notes (Signed)
 eLink Physician-Brief Progress Note Patient Name: [name] DOB: 1946-02-03 MRN: [id number]   Date of Service  11/03/2024  HPI/Events of Note  Notified by pharmacy that patient's blood cultures positive 1/3 bottles staph epi, methicillin resistance detected. Patient with bowel perforation s/p omental patch. Suspect that blood culture likely contaminant given concern for intra-abdominal infection and patient remains on broad spectrum antibiotics (linezolid /zosyn ) so linezolid  should still cover at this time.  Patient seen on camera screaming and appears to be in pain Morphine  2 mg IV just given  eICU Interventions  She is off pressors but did have a low grade temp earlier which has not recurred. Will monitor for now. Discussed with [facility] to inform eLink if with persistent fever or with hemodynamic changes. Also to inform us  if pain persistent.     Intervention Category Intermediate Interventions: Diagnostic test evaluation;Infection - evaluation and management  Damien ONEIDA Grout 11/03/2024, 8:12 PM

## 2024-11-03 NOTE — Progress Notes (Signed)
 eLink Physician-Brief Progress Note Patient Name: [name] DOB: [birth year] MRN: [id number]   Date of Service  11/03/2024  HPI/Events of Note  Still in pain despite morphine   eICU Interventions  Dilaudid  0.5 mg ordered prn for breakthrough pain     Intervention Category Minor Interventions: Routine modifications to care plan (e.g. PRN medications for pain, fever)  Damien DASEN [name] 11/03/2024, 11:11 PM

## 2024-11-03 NOTE — Plan of Care (Signed)
" °  Problem: Health Behavior/Discharge Planning: Goal: Ability to manage health-related needs will improve Outcome: Progressing   Problem: Clinical Measurements: Goal: Will remain free from infection Outcome: Progressing   Problem: Activity: Goal: Risk for activity intolerance will decrease Outcome: Not Progressing   Problem: Nutrition: Goal: Adequate nutrition will be maintained Outcome: Not Progressing   Problem: Coping: Goal: Level of anxiety will decrease Outcome: Not Progressing   "

## 2024-11-03 NOTE — Progress Notes (Signed)
 Pharmacy Antibiotic Note  [name] is a 78 y.o. female admitted on 11/02/2024 with sepsis. Taken emergently to the OR on 12/26 after CT revealed bowel perforation. Acute hypoxic respiratory failure. Hx of CKD with an AKI so will try to avoid nephrotoxic agents. Changing antibiotics to Zosyn  and linezolid .   Plan: Zosyn  2.25g gm IV q8h (4 hour infusion) Linezolid  600mg  IV Q12h Monitor clinical picture, renal function F/U C&S, abx deescalation / LOT  Height: 5' 5 (165.1 cm) Weight: 61.4 kg (135 lb 5.8 oz) IBW/kg (Calculated) : 57  Temp (24hrs), Avg:97.6 F (36.4 C), Min:97.5 F (36.4 C), Max:97.8 F (36.6 C)  Recent Labs  Lab 11/02/24 2013 11/02/24 2015 11/02/24 2016 11/02/24 2202 11/03/24 0339 11/03/24 0539  WBC 8.5  --   --   --  4.6 3.7*  CREATININE 2.53*  --  2.90*  --  2.17* 2.32*  LATICACIDVEN  --  4.4*  --  2.8* 4.6* 4.7*    Estimated Creatinine Clearance: 18.6 mL/min (A) (by C-G formula based on SCr of 2.32 mg/dL (H)).    Allergies[1]  Antimicrobials this admission: Cefepime  12/26 x 1 Flagyl  12/27 x 1 Zosyn  12/27 >> Linezolid  12/27 >>  Dose adjustments this admission:   Microbiology results: MRSA PCR positive 12/27 BCx: sent Resp Cx: negative  Thank you for allowing pharmacy to be a part of this patients care.  Rankin Dee, PharmD, BCPS, BCIDP Clinical Pharmacist 11/03/2024 7:55 AM      [1]  Allergies Allergen Reactions   Zanaflex  [Tizanidine ] Other (See Comments)    Hallucinations only with higher doses- otherwise, it is tolerated    Adhesive [Tape] Itching   Latex Itching and Rash

## 2024-11-03 NOTE — Progress Notes (Signed)
 1 Day Post-Op Omental patch repair of gastro-jejunal perforation Subjective: NAD, sedated on vent, no pressor support as of now  Objective: Vital signs in last 24 hours: Temp:  [97.5 F (36.4 C)-98.9 F (37.2 C)] 98.9 F (37.2 C) (12/27 0843) Pulse Rate:  [66-108] 66 (12/27 0600) Resp:  [15-31] 15 (12/27 0600) BP: (83-119)/(45-71) 83/65 (12/27 0600) SpO2:  [98 %-100 %] 100 % (12/27 0540) FiO2 (%):  [40 %-100 %] 40 % (12/27 0540) Weight:  [61.4 kg] 61.4 kg (12/27 0437)   Intake/Output from previous day: 12/26 0701 - 12/27 0700 In: 2980.5 [I.V.:1488.6; IV Piggyback:1491.9] Out: 350 [Urine:300; Blood:50] Intake/Output this shift: Total I/O In: 1138.6 [I.V.:838.6; IV Piggyback:300] Out: -    General appearance: alert and cooperative GI: soft, dressing intact  Incision: no significant drainage  Lab Results:  Recent Labs    11/03/24 0339 11/03/24 0539  WBC 4.6 3.7*  HGB 11.5* 10.1*  HCT 37.5 33.3*  PLT 142* 151   BMET Recent Labs    11/03/24 0339 11/03/24 0539  NA 142 140  K 4.4 4.1  CL 109 108  CO2 15* 15*  GLUCOSE 39* 70  BUN 37* 35*  CREATININE 2.17* 2.32*  CALCIUM 8.5* 8.2*   PT/INR Recent Labs    11/02/24 2153  LABPROT 17.5*  INR 1.4*   ABG Recent Labs    11/03/24 0215 11/03/24 0439  PHART 7.16* 7.3*  HCO3 15.0* 18.3*    MEDS, Scheduled  [START ON 11/04/2024] Chlorhexidine  Gluconate Cloth  6 each Topical Q2200   docusate  100 mg Per Tube BID   insulin  aspart  0-9 Units Subcutaneous Q4H   mouth rinse  15 mL Mouth Rinse Q2H   pantoprazole  (PROTONIX ) IV  40 mg Intravenous QHS   polyethylene glycol  17 g Per Tube Daily    Studies/Results:   Assessment: s/p Procedures: LAPAROTOMY, EXPLORATORY AND OMENTAL PATCH REPAIR OF PERFORATION OF GASTROJEJUNOSTOMY Patient Active Problem List   Diagnosis Date Noted   Bowel perforation (HCC) 11/03/2024   Small bowel perforation (HCC) 11/03/2024   Tachycardia 10/15/2024   Tremor 09/03/2024    Anxiety 09/03/2024   Opioid dependence with withdrawal (HCC) 05/20/2023   Deep vein thrombosis (DVT) of proximal vein of both lower extremities, unspecified chronicity (HCC) 05/20/2023   Elevated BP without diagnosis of hypertension 11/12/2021   Dysuria 10/16/2019   Suspected COVID-19 virus infection 10/16/2019   Closed nondisplaced intertrochanteric fracture of right femur (HCC) 05/31/2019   Closed displaced comminuted supracondylar fracture of left humerus without intercondylar fracture 04/17/2019   Nonfunctioning kidney 12/25/2018   Hydronephrosis with ureteral stricture, not elsewhere classified 12/21/2018   Malnutrition of moderate degree 12/08/2018   Delirium 12/07/2018   Hydronephrosis of left kidney 12/07/2018   Pyelonephritis 12/07/2018   Protein-calorie malnutrition, severe 08/24/2018   Pressure injury of skin 08/22/2018   Obstruction of left ureteropelvic junction (UPJ) due to stone 08/21/2018   Hypoalbuminemia 08/21/2018   Chronic pain disorder 08/21/2018   Thrombocytopenia 08/21/2018   Acute renal failure (ARF) 08/21/2018   Compression fracture of spine (HCC) 08/21/2018   Acute respiratory failure with hypoxemia (HCC)    Hematoma 06/03/2018   Dysphagia 02/07/2018   Generalized anxiety disorder 07/04/2017   Nonintractable headache 07/04/2017   Exertional dyspnea 08/05/2016   Sepsis (HCC) 03/07/2015   Hypokalemia 03/07/2015   Acute encephalopathy 03/06/2015   Fever 03/06/2015   Chronic back pain 01/15/2015   Obesity (BMI 30-39.9) 10/15/2013   Pulmonary nodules 10/15/2013   CAP (community acquired pneumonia)  10/02/2013   Primary hypertension 08/24/2013   Idiopathic scoliosis 04/19/2013   Back pain 04/19/2013   Osteoporosis 04/19/2013   Edema 04/19/2013   Mild diastolic dysfunction 01/01/2013   Leg pain, bilateral 12/16/2012   Attention deficit disorder (ADD) in adult 05/24/2012   Involuntary movements 05/24/2012   DIZZINESS 12/31/2009   TREMOR, ESSENTIAL  12/12/2009   PALPITATIONS, RECURRENT 12/12/2009   OTHER VITAMIN B12 DEFICIENCY ANEMIA 10/30/2009   FEMALE STRESS INCONTINENCE 10/28/2009   MEMORY LOSS 10/28/2009   GERD 12/03/2008   ANEMIA, MILD 06/26/2008   DEPRESSION/ANXIETY 12/28/2007   PICA 12/28/2007   ACUTE BRONCHITIS 12/28/2007   FATIGUE 12/28/2007   Hyperlipidemia 05/15/2007   ARTIFICIAL MENOPAUSE 05/15/2007   Myalgia and myositis, unspecified 05/15/2007   Headache 05/15/2007   CHEST PAIN, ATYPICAL 05/15/2007   SYMPTOM, PAIN, ABDOMINAL, EPIGASTRIC 05/15/2007   Personal history presenting hazards to health 05/15/2007   PSTPRC STATUS, BARIATRIC SURGERY 05/15/2007    Pt s/p RNYGB ~20 yrs ago with GJ perforation treated with omental patch  Plan: Medical management per CCM Cont NG Cont protonix  BID Monitor drain ouptu   LOS: 0 days     .Bernarda JAYSON Ned, MD [facility], [place]    11/03/2024 9:08 AM

## 2024-11-03 NOTE — Progress Notes (Addendum)
 eLink Physician-Brief Progress Note Patient Name: TESHIA MAHONE DOB: September 28, 1946 MRN: [id number]   Date of Service  11/03/2024  HPI/Events of Note  78 yr old woman now in ICU following ex lap. Surgery note reviewed. Has a JP drain, ETT, NG tube, foley. Waking up and does not have sedation ordered. Given cefepime , flagyl  and vanc,   eICU Interventions  Sedation, vent support, CXR, ABG ordered Foley ordered (was placed in OR) CCM to evaluate  Call E link if needed  No distress with HR 110 and SBP 140s on camera.      Intervention Category Major Interventions: Sepsis - evaluation and management;Respiratory failure - evaluation and management Evaluation Type: New Patient Evaluation  Ayari Liwanag G Governor Matos 11/03/2024, 1:42 AM  3 am - ABG shows metabolic acidosis. CBC BMP and LA ordered. Stop LR and start IV bicarb,   630 am - Soft systolic BP though Map is ok. Ph is better. :LR bol;us  x 1. LA pending.

## 2024-11-03 NOTE — Transfer of Care (Addendum)
 Immediate Anesthesia Transfer of Care Note  Patient: [name]  Procedure(s) Performed: LAPAROTOMY, EXPLORATORY AND OMENTAL PATCH REPAIR OF PERFORATION OF GASTROJEJUNOSTOMY  Patient Location: ICU  Anesthesia Type:General  Level of Consciousness: sedated and Patient remains intubated per anesthesia plan  Airway & Oxygen Therapy: Patient remains intubated per anesthesia plan and Patient placed on Ventilator (see vital sign flow sheet for setting)  Post-op Assessment: Report given to RN  Post vital signs: Reviewed and stable  Last Vitals:  Vitals Value Taken Time  BP 114/76   Temp 36.5   Pulse 95   Resp 12   SpO2 100     Last Pain:  Vitals:   11/02/24 2258  TempSrc:   PainSc: 4          Complications: No notable events documented.

## 2024-11-03 NOTE — H&P (Signed)
 "  NAME:  [name], MRN:  [id number], DOB:  Mar 09, 1946, LOS: 0 ADMISSION DATE:  11/02/2024, CONSULTATION DATE:  11/03/2024 REFERRING MD:  Deward Null III, MD, CHIEF COMPLAINT:  Abdominal pain   History of Present Illness:  78 y/o female with PMH for HTN, Tremor, CKD, Malnutrition, Depression, One Kidney, Spinal cord stimulator, forward flexed neck who presented with abd pain.  Appararently agitated and altered. CT abd showing large amount of free air consistent wiuth bowel perforation.  She was emergently taken to the OR.  She underwent an ex-lap and found to have perforation at the gastrojejunostomy.  She had an omental patch repair.  Pertinent  Medical History   HTN, Tremor, CKD, Malnutrition, Depression, One Kidney, Spinal cord stimulator, forward flexed neck  Significant Hospital Events: Including procedures, antibiotic start and stop dates in addition to other pertinent events   12/27: admit to ICU post op  Interim History / Subjective:  N/A  Objective    Blood pressure 99/63, pulse (!) 104, temperature (!) 97.5 F (36.4 C), resp. rate (!) 21, height 5' 5 (1.651 m), weight 61.4 kg, SpO2 98%.    Vent Mode: PRVC FiO2 (%):  [100 %] 100 % Set Rate:  [15 bmp] 15 bmp Vt Set:  [460 mL] 460 mL PEEP:  [5 cmH20] 5 cmH20 Plateau Pressure:  [21 cmH20] 21 cmH20   Intake/Output Summary (Last 24 hours) at 11/03/2024 0253 Last data filed at 11/03/2024 0100 Gross per 24 hour  Intake 1450 ml  Output 350 ml  Net 1100 ml   Filed Weights   11/02/24 2100  Weight: 61.4 kg    Examination: General: Sedated and Intubated NAD HENT: PERRLA, no icterus neck supple Lungs: CTA no wheezes no rales Cardiovascular: Reg s1s2 no murmurs Abdomen: post op, JP drain, no BS Extremities: no edema or clubbing or cyanosis Neuro: sedated and intubated   Resolved problem list   Assessment and Plan  Acute hypoxic respiratory failure Vent management SAT/SBT when medically appropriate Sedation for  intubation Follow ABGs/VBGs/O2 sats Small bowel perforation  S/p ex-lap and omental patch  Abd closed with JP drain Analgesia Sepsis Broad spectrum antibiotics IV fluids Lactic Acidosis Trend levels Should improve post op with IV hydration AKI on CKD She apparently has 1 kidney Avoid nephrotoxic agents HTN Prn IV meds until po Malnutrition Tube feeds when cleared by surgery   Labs   CBC: Recent Labs  Lab 11/02/24 2013 11/02/24 2016  WBC 8.5  --   NEUTROABS 7.1  --   HGB 13.8 15.3*  HCT 44.7 45.0  MCV 110.4*  --   PLT 241  --     Basic Metabolic Panel: Recent Labs  Lab 11/02/24 2013 11/02/24 2016  NA 142 139  K 4.2 5.5*  CL 106 110  CO2 16*  --   GLUCOSE 61* 58*  BUN 39* 64*  CREATININE 2.53* 2.90*  CALCIUM 9.2  --    GFR: Estimated Creatinine Clearance: 14.9 mL/min (A) (by C-G formula based on SCr of 2.9 mg/dL (H)). Recent Labs  Lab 11/02/24 2013 11/02/24 2015 11/02/24 2202  WBC 8.5  --   --   LATICACIDVEN  --  4.4* 2.8*    Liver Function Tests: Recent Labs  Lab 11/02/24 2013  AST 63*  ALT 13  ALKPHOS 105  BILITOT 0.6  PROT 7.9  ALBUMIN  4.0   No results for input(s): LIPASE, AMYLASE in the last 168 hours. No results for input(s): AMMONIA in the last 168  hours.  ABG    Component Value Date/Time   PHART 7.16 (LL) 11/03/2024 0215   PCO2ART 42 11/03/2024 0215   PO2ART 295 (H) 11/03/2024 0215   HCO3 15.0 (L) 11/03/2024 0215   TCO2 19 (L) 11/02/2024 2016   ACIDBASEDEF 13.4 (H) 11/03/2024 0215   O2SAT 100 11/03/2024 0215     Coagulation Profile: Recent Labs  Lab 11/02/24 2153  INR 1.4*    Cardiac Enzymes: No results for input(s): CKTOTAL, CKMB, CKMBINDEX, TROPONINI in the last 168 hours.  HbA1C: Hgb A1c MFr Bld  Date/Time Value Ref Range Status  04/22/2014 11:53 AM 5.3 4.6 - 6.5 % Final    Comment:    Glycemic Control Guidelines for People with Diabetes:Non Diabetic:  <6%Goal of Therapy: <7%Additional Action  Suggested:  >8%     CBG: No results for input(s): GLUCAP in the last 168 hours.  Review of Systems:   Intubated and sedated  Past Medical History:  She,  has a past medical history of Abrasion of right heel, ADD (attention deficit disorder), Anemia, mild, Anxiety, Arthritis, At high risk for falls, Chronic back pain, Crushing injury of arm, right (02/06/1989's), Crushing injury of left wrist (05/11/1989's), DOE (dyspnea on exertion), Essential tremor, Fibromyalgia, History of palpitations (2002), History of panic attacks, History of pneumonia (08/21/2018), History of sepsis, Hyperlipidemia, Hypertension, Idiopathic scoliosis (04/19/2013), Left ureteral stone, Migraines, Osteoporosis, Other vitamin B12 deficiency anemia, PONV (postoperative nausea and vomiting), Pulmonary nodules/lesions, multiple, Renal insufficiency, S/P gastric bypass (09/16/2003), S/P insertion of spinal cord stimulator, Wears dentures, Wears glasses, and Wears glasses.   Surgical History:   Past Surgical History:  Procedure Laterality Date   ABDOMINAL HYSTERECTOMY  1980's   W/  BSO  AND APPENDECTOMY   CARDIAC CATHETERIZATION  12-13-2002    dr delford   normal coronaries   CYSTOSCOPY WITH URETEROSCOPY AND STENT PLACEMENT Left 09/27/2018   Procedure: LEFT  URETEROSCOPY, HOLMIUM LASER LITHOTRIPSY;  Surgeon: Cam Morene ORN, MD;  Location: [facility];  Service: Urology;  Laterality: Left;   D & C HYSTERSCOPY RESECTION MYOMECTOMY  1980s   HUMERUS FRACTURE SURGERY Left 04/17/2019    Comminuted complex left intra-articular distal humerus fracture/supracondylar humerus fracture displaced   INTRAMEDULLARY (IM) NAIL INTERTROCHANTERIC Right 06/01/2019   Procedure: INTRAMEDULLARY (IM) NAIL INTERTROCHANTRIC;  Surgeon: Melodi Lerner, MD;  Location: [facility];  Service: Orthopedics;  Laterality: Right;   IR NEPHROSTOMY PLACEMENT LEFT  08/22/2018   IR NEPHROSTOMY PLACEMENT LEFT  12/08/2018   KNEE ARTHROSCOPY Right 03-31-2001    dr  amy @[facility]    NEPHROLITHOTOMY Left 10/20/2018   Procedure: LEFT NEPHROLITHOTOMY PERCUTANEOUS;  Surgeon: Cam Morene ORN, MD;  Location: [facility];  Service: Urology;  Laterality: Left;   ORIF HUMERUS FRACTURE Left 04/17/2019   Procedure: Open reduction internal fixation left supracondylar humerus fracture with olecranon osteotomy and ulnar nerve release and anterior transposition and repair of structures as necessary;  Surgeon: Camella Fallow, MD;  Location: [facility];  Service: Orthopedics;  Laterality: Left;  3 hrs   REDUCTION MAMMAPLASTY Bilateral 2001   ROBOT ASSISTED LAPAROSCOPIC NEPHRECTOMY Left 12/25/2018   Procedure: XI ROBOTIC ASSISTED LAPAROSCOPIC NEPHRECTOMY, LEFT FLANK EXPLORATION WITH REMOVAL OF BATTERY PACK;  Surgeon: Cam Morene ORN, MD;  Location: [facility];  Service: Urology;  Laterality: Left;   ROUX-EN-Y GASTRIC BYPASS  09-16-2003   dr christella. gladis  @[facility]    via Laparoscopy w/ gastrojejunostomy   SPINAL CORD STIMULATOR IMPLANT  2016   10-19-2018  PER PT HAS REMOTE BUT HAS NOT USED  IT SINCE DEC 2018   TONSILLECTOMY AND ADENOIDECTOMY  1955   TUBAL LIGATION  1980's     Social History:   reports that she has never smoked. She has never used smokeless tobacco. She reports that she does not drink alcohol and does not use drugs.   Family History:  Her family history includes COPD in her mother; Emphysema in her mother; Heart attack in her mother; Other in her mother. She was adopted.   Allergies Allergies[1]   Home Medications  Prior to Admission medications  Medication Sig Start Date End Date Taking? Authorizing Provider  amLODipine  (NORVASC ) 5 MG tablet 1 po bid 10/22/24   Antonio Meth, [name], DO  clonazePAM  (KLONOPIN ) 0.5 MG tablet Take 1 tablet (0.5 mg total) by mouth 3 (three) times daily as needed. 10/22/24   Antonio Meth Rockers R, DO  loperamide  (IMODIUM ) 2 MG capsule TAKE 1 CAPSULE BY MOUTH DAILY AS NEEDED FOR DIARRHEA OR LOOSE STOOLS Patient taking differently: Take  2 mg by mouth daily as needed for diarrhea or loose stools. 07/27/24   Antonio Meth Rockers JONELLE, DO  losartan  (COZAAR ) 50 MG tablet TAKE 1 TABLET BY MOUTH DAILY 10/16/24   Antonio Meth, [name], DO  ondansetron  (ZOFRAN -ODT) 8 MG disintegrating tablet Take 1 tablet (8 mg total) by mouth every 8 (eight) hours as needed for nausea or vomiting. Patient taking differently: Take 8 mg by mouth every 8 (eight) hours as needed for nausea or vomiting (DISSOLVE ORALLY). 05/10/24   Antonio Meth Rockers JONELLE, DO  oxyCODONE  (ROXICODONE ) 15 MG immediate release tablet Take 1 tablet (15 mg total) by mouth 4 (four) times daily. Patient taking differently: Take 15 mg by mouth See admin instructions. Take 15 mg by mouth every four to six hours 04/19/19   Camella Fallow, MD  SUMAtriptan  (IMITREX ) 50 MG tablet TAKE 1 TABLET BY MOUTH AT ONSET OF MIGRAINE; MAY REPEAT 1 TABLET IN 2 HOURS IF NEEDED. 10/22/24   Antonio Meth, [name], DO  tiZANidine  (ZANAFLEX ) 4 MG tablet Take 4 mg by mouth at bedtime. 07/05/24   [provider]  traMADol  (ULTRAM ) 50 MG tablet Take 50 mg by mouth 2 (two) times daily as needed (for pain).    [provider]  TYLENOL  500 MG tablet Take 500-1,000 mg by mouth every 6 (six) hours as needed for mild pain (pain score 1-3) (or headaches).    [provider]  venlafaxine  XR (EFFEXOR -XR) 75 MG 24 hr capsule Take 2 capsules (150 mg total) by mouth daily. 08/20/24   Antonio Meth Rockers JONELLE, DO     Critical care time: 49   The patient is critically ill with multiple organ system failure and requires high complexity decision making for assessment and support, frequent evaluation and titration of therapies, advanced monitoring, review of radiographic studies and interpretation of complex data.   Critical Care Time devoted to patient care services, exclusive of separately billable procedures, described in this note is 34 minutes.   Orlin Fairly, MD [facility] See Amion  for pager  If no response to pager , please call [phone] until 7pm After 7:00 pm call Elink  [phone] 11/03/2024, 2:54 AM            [1]  Allergies Allergen Reactions   Zanaflex  [Tizanidine ] Other (See Comments)    Hallucinations only with higher doses- otherwise, it is tolerated    Adhesive [Tape] Itching   Latex Itching and Rash   "

## 2024-11-04 DIAGNOSIS — N189 Chronic kidney disease, unspecified: Secondary | ICD-10-CM | POA: Diagnosis not present

## 2024-11-04 DIAGNOSIS — E872 Acidosis, unspecified: Secondary | ICD-10-CM | POA: Diagnosis not present

## 2024-11-04 DIAGNOSIS — E46 Unspecified protein-calorie malnutrition: Secondary | ICD-10-CM | POA: Diagnosis not present

## 2024-11-04 DIAGNOSIS — D649 Anemia, unspecified: Secondary | ICD-10-CM | POA: Diagnosis not present

## 2024-11-04 DIAGNOSIS — R451 Restlessness and agitation: Secondary | ICD-10-CM | POA: Diagnosis not present

## 2024-11-04 DIAGNOSIS — K631 Perforation of intestine (nontraumatic): Secondary | ICD-10-CM | POA: Diagnosis not present

## 2024-11-04 DIAGNOSIS — J9601 Acute respiratory failure with hypoxia: Secondary | ICD-10-CM | POA: Diagnosis not present

## 2024-11-04 DIAGNOSIS — D696 Thrombocytopenia, unspecified: Secondary | ICD-10-CM | POA: Diagnosis not present

## 2024-11-04 DIAGNOSIS — G8929 Other chronic pain: Secondary | ICD-10-CM | POA: Diagnosis not present

## 2024-11-04 DIAGNOSIS — I129 Hypertensive chronic kidney disease with stage 1 through stage 4 chronic kidney disease, or unspecified chronic kidney disease: Secondary | ICD-10-CM | POA: Diagnosis not present

## 2024-11-04 DIAGNOSIS — A419 Sepsis, unspecified organism: Secondary | ICD-10-CM | POA: Diagnosis not present

## 2024-11-04 LAB — CBC
HCT: 34.1 % — ABNORMAL LOW (ref 36.0–46.0)
Hemoglobin: 11.3 g/dL — ABNORMAL LOW (ref 12.0–15.0)
MCH: 34.1 pg — ABNORMAL HIGH (ref 26.0–34.0)
MCHC: 33.1 g/dL (ref 30.0–36.0)
MCV: 103 fL — ABNORMAL HIGH (ref 80.0–100.0)
Platelets: 128 K/uL — ABNORMAL LOW (ref 150–400)
RBC: 3.31 MIL/uL — ABNORMAL LOW (ref 3.87–5.11)
RDW: 16.4 % — ABNORMAL HIGH (ref 11.5–15.5)
WBC: 5.6 K/uL (ref 4.0–10.5)
nRBC: 0.7 % — ABNORMAL HIGH (ref 0.0–0.2)

## 2024-11-04 LAB — GLUCOSE, CAPILLARY
Glucose-Capillary: 68 mg/dL — ABNORMAL LOW (ref 70–99)
Glucose-Capillary: 87 mg/dL (ref 70–99)
Glucose-Capillary: 123 mg/dL — ABNORMAL HIGH (ref 70–99)
Glucose-Capillary: 91 mg/dL (ref 70–99)
Glucose-Capillary: 64 mg/dL — ABNORMAL LOW (ref 70–99)
Glucose-Capillary: 98 mg/dL (ref 70–99)

## 2024-11-04 LAB — CBC WITH DIFFERENTIAL/PLATELET
Abs Immature Granulocytes: 1.39 K/uL — ABNORMAL HIGH (ref 0.00–0.07)
Basophils Absolute: 0 K/uL (ref 0.0–0.1)
Basophils Relative: 0 %
Eosinophils Absolute: 0 K/uL (ref 0.0–0.5)
Eosinophils Relative: 0 %
HCT: 35 % — ABNORMAL LOW (ref 36.0–46.0)
Hemoglobin: 11.9 g/dL — ABNORMAL LOW (ref 12.0–15.0)
Immature Granulocytes: 23 %
Lymphocytes Relative: 5 %
Lymphs Abs: 0.3 K/uL — ABNORMAL LOW (ref 0.7–4.0)
MCH: 34.2 pg — ABNORMAL HIGH (ref 26.0–34.0)
MCHC: 34 g/dL (ref 30.0–36.0)
MCV: 100.6 fL — ABNORMAL HIGH (ref 80.0–100.0)
Monocytes Absolute: 0.4 K/uL (ref 0.1–1.0)
Monocytes Relative: 6 %
Neutro Abs: 4.1 K/uL (ref 1.7–7.7)
Neutrophils Relative %: 66 %
Platelets: 94 K/uL — ABNORMAL LOW (ref 150–400)
RBC: 3.48 MIL/uL — ABNORMAL LOW (ref 3.87–5.11)
RDW: 16.5 % — ABNORMAL HIGH (ref 11.5–15.5)
Smear Review: NORMAL
WBC: 6.1 K/uL (ref 4.0–10.5)
nRBC: 1.1 % — ABNORMAL HIGH (ref 0.0–0.2)

## 2024-11-04 LAB — BASIC METABOLIC PANEL WITH GFR
Anion gap: 13 (ref 5–15)
BUN: 27 mg/dL — ABNORMAL HIGH (ref 8–23)
CO2: 24 mmol/L (ref 22–32)
Calcium: 8.8 mg/dL — ABNORMAL LOW (ref 8.9–10.3)
Chloride: 104 mmol/L (ref 98–111)
Creatinine, Ser: 1.44 mg/dL — ABNORMAL HIGH (ref 0.44–1.00)
GFR, Estimated: 38 mL/min — ABNORMAL LOW
Glucose, Bld: 99 mg/dL (ref 70–99)
Potassium: 3.8 mmol/L (ref 3.5–5.1)
Sodium: 141 mmol/L (ref 135–145)

## 2024-11-04 LAB — CULTURE, BLOOD (ROUTINE X 2): Special Requests: ADEQUATE

## 2024-11-04 LAB — MAGNESIUM: Magnesium: 2 mg/dL (ref 1.7–2.4)

## 2024-11-04 LAB — TRIGLYCERIDES: Triglycerides: 78 mg/dL

## 2024-11-04 MED ORDER — MIDAZOLAM HCL (PF) 2 MG/2ML IJ SOLN
1.0000 mg | INTRAMUSCULAR | Status: DC | PRN
  Administered 2024-11-04 – 2024-11-05 (×3): 1 mg via INTRAVENOUS
  Filled 2024-11-04 (×2): qty 2

## 2024-11-04 MED ORDER — MIDAZOLAM HCL (PF) 2 MG/2ML IJ SOLN
2.0000 mg | Freq: Once | INTRAMUSCULAR | Status: AC
  Administered 2024-11-04: 2 mg via INTRAVENOUS

## 2024-11-04 MED ORDER — ORAL CARE MOUTH RINSE: 15.0000 mL | OROMUCOSAL | Status: DC | PRN

## 2024-11-04 MED ORDER — MIDAZOLAM HCL 2 MG/2ML IJ SOLN
INTRAMUSCULAR | Status: AC
  Filled 2024-11-04: qty 2

## 2024-11-04 NOTE — Progress Notes (Signed)
 PT Cancellation Note  Patient Details Name: [name] MRN: [id number] DOB: [birth year]   Cancelled Treatment:     PT order received but eval deferred at request of RN - pt with increased SOB/WOB/  Will follow.   Corey Laski 11/04/2024, 2:43 PM

## 2024-11-04 NOTE — Progress Notes (Signed)
 "  NAME:  [name], MRN:  [id number], DOB:  02-04-1946, LOS: 1 ADMISSION DATE:  11/02/2024, CONSULTATION DATE:  11/03/2024 REFERRING MD:  Deward Null III, MD, CHIEF COMPLAINT:  Abdominal pain   History of Present Illness:  78 y/o female with PMH for HTN, Tremor, CKD, Malnutrition, Depression, One Kidney, Spinal cord stimulator, forward flexed neck who presented with abd pain.  Appararently agitated and altered. CT abd showing large amount of free air consistent wiuth bowel perforation.  She was emergently taken to the OR.  She underwent an ex-lap and found to have perforation at the gastrojejunostomy.  She had an omental patch repair.  Pertinent  Medical History   HTN, Tremor, CKD, Malnutrition, Depression, One Kidney, Spinal cord stimulator, forward flexed neck  Significant Hospital Events: Including procedures, antibiotic start and stop dates in addition to other pertinent events   12/27: admit to ICU post op, extubated  Interim History / Subjective:   Patient with intermittent pain and agitation   Objective    Blood pressure (!) 182/99, pulse (!) 123, temperature 99 F (37.2 C), temperature source Axillary, resp. rate 20, height 5' 5 (1.651 m), weight 61.4 kg, SpO2 96%.        Intake/Output Summary (Last 24 hours) at 11/04/2024 1217 Last data filed at 11/04/2024 0600 Gross per 24 hour  Intake 3130.8 ml  Output 1635 ml  Net 1495.8 ml   Filed Weights   11/02/24 2100 11/03/24 0437 11/04/24 0400  Weight: 61.4 kg 61.4 kg 61.4 kg    Examination: General: no acute distress, elderly woman, laying in bed HENT: [place]/AT, moist mucous membranes Lungs: clear to auscultation although poor effort, no wheezes, no rales Cardiovascular: Reg s1s2 no murmurs Abdomen: soft, JP drain - serosanguinous, no BS Extremities: no edema or clubbing or cyanosis Neuro: PERRL, following commands   Resolved problem list   Assessment and Plan  Acute hypoxic respiratory failure - extubated 12/27,  remains on nasal canula - goal SpO2 92% or higher  Small bowel perforation  - S/p ex-lap and omental patch  - Abd closed with JP drain - PRN dilaudid  for pain - management per surgery  Sepsis due to acute abdomen - continue zyvox , zosyn  - follow up cultures  AKI on CKD AGMA Lactic Acidosis - serum Cr improving - Avoid nephrotoxic agents  HTN - Prn IV meds until po  Malnutrition - tube feeds when cleared by surgery  Mild Anemia Thrombocytopenia - trend  Labs   CBC: Recent Labs  Lab 11/02/24 2013 11/02/24 2016 11/03/24 0339 11/03/24 0539 11/03/24 2307 11/04/24 0753  WBC 8.5  --  4.6 3.7* 5.6 6.1  NEUTROABS 7.1  --  3.9  --   --  4.1  HGB 13.8 15.3* 11.5* 10.1* 11.3* 11.9*  HCT 44.7 45.0 37.5 33.3* 34.1* 35.0*  MCV 110.4*  --  110.3* 112.9* 103.0* 100.6*  PLT 241  --  142* 151 128* 94*    Basic Metabolic Panel: Recent Labs  Lab 11/02/24 2013 11/02/24 2016 11/03/24 0339 11/03/24 0539 11/03/24 1456 11/04/24 0753  NA 142 139 142 140 139 141  K 4.2 5.5* 4.4 4.1 4.6 3.8  CL 106 110 109 108 103 104  CO2 16*  --  15* 15* 21* 24  GLUCOSE 61* 58* 39* 70 104* 99  BUN 39* 64* 37* 35* 31* 27*  CREATININE 2.53* 2.90* 2.17* 2.32* 1.86* 1.44*  CALCIUM 9.2  --  8.5* 8.2* 8.6* 8.8*  MG  --   --  2.0 2.1  --  2.0  PHOS  --   --  4.7* 4.1  --   --    GFR: Estimated Creatinine Clearance: 29.9 mL/min (A) (by C-G formula based on SCr of 1.44 mg/dL (H)). Recent Labs  Lab 11/02/24 2015 11/02/24 2202 11/03/24 0339 11/03/24 0539 11/03/24 2307 11/04/24 0753  WBC  --   --  4.6 3.7* 5.6 6.1  LATICACIDVEN 4.4* 2.8* 4.6* 4.7*  --   --     Liver Function Tests: Recent Labs  Lab 11/02/24 2013 11/03/24 0339  AST 63* 126*  ALT 13 22  ALKPHOS 105 85  BILITOT 0.6 0.7  PROT 7.9 6.1*  ALBUMIN  4.0 3.1*   No results for input(s): LIPASE, AMYLASE in the last 168 hours. No results for input(s): AMMONIA in the last 168 hours.  ABG    Component Value Date/Time    PHART 7.3 (L) 11/03/2024 0439   PCO2ART 37 11/03/2024 0439   PO2ART 146 (H) 11/03/2024 0439   HCO3 18.3 (L) 11/03/2024 0439   TCO2 19 (L) 11/02/2024 2016   ACIDBASEDEF 7.7 (H) 11/03/2024 0439   O2SAT 100 11/03/2024 0439     Coagulation Profile: Recent Labs  Lab 11/02/24 2153  INR 1.4*    Cardiac Enzymes: No results for input(s): CKTOTAL, CKMB, CKMBINDEX, TROPONINI in the last 168 hours.  HbA1C: Hgb A1c MFr Bld  Date/Time Value Ref Range Status  11/03/2024 03:39 AM 5.2 4.8 - 5.6 % Final    Comment:    (NOTE) Diagnosis of Diabetes The following HbA1c ranges recommended by the American Diabetes Association (ADA) may be used as an aid in the diagnosis of diabetes mellitus.  Hemoglobin             Suggested A1C NGSP%              Diagnosis  <5.7                   Non Diabetic  5.7-6.4                Pre-Diabetic  >6.4                   Diabetic  <7.0                   Glycemic control for                       adults with diabetes.    04/22/2014 11:53 AM 5.3 4.6 - 6.5 % Final    Comment:    Glycemic Control Guidelines for People with Diabetes:Non Diabetic:  <6%Goal of Therapy: <7%Additional Action Suggested:  >8%     CBG: Recent Labs  Lab 11/03/24 1201 11/03/24 1629 11/03/24 2009 11/03/24 2330 11/04/24 0409  GLUCAP 80 107* 82 117* 87       Critical care time: n/a    Dorn Chill, MD [facility] Office: [phone]   See Amion for personal pager PCCM on call pager [phone] until 7pm. Please call Elink 7p-7a. [phone]          "

## 2024-11-04 NOTE — Progress Notes (Signed)
 2 Days Post-Op Omental patch repair of gastro-jejunal perforation Subjective: NAD, extubated, oriented to self   Objective: Vital signs in last 24 hours: Temp:  [97.7 F (36.5 C)-100 F (37.8 C)] 98.4 F (36.9 C) (12/28 0400) Pulse Rate:  [59-132] 123 (12/28 0645) Resp:  [15-44] 20 (12/28 0645) BP: (79-177)/(53-136) 170/96 (12/28 0600) SpO2:  [92 %-100 %] 96 % (12/28 0645) FiO2 (%):  [40 %] 40 % (12/27 0937) Weight:  [61.4 kg] 61.4 kg (12/28 0400)   Intake/Output from previous day: 12/27 0701 - 12/28 0700 In: 4269.4 [I.V.:3169; IV Piggyback:1100.5] Out: 1745 [Urine:1375; Drains:370] Intake/Output this shift: No intake/output data recorded.   General appearance: alert and cooperative GI: soft, dressing intact JP: cloudy SS fluid present Incision: no significant drainage  Lab Results:  Recent Labs    11/03/24 0539 11/03/24 2307  WBC 3.7* 5.6  HGB 10.1* 11.3*  HCT 33.3* 34.1*  PLT 151 128*   BMET Recent Labs    11/03/24 0539 11/03/24 1456  NA 140 139  K 4.1 4.6  CL 108 103  CO2 15* 21*  GLUCOSE 70 104*  BUN 35* 31*  CREATININE 2.32* 1.86*  CALCIUM 8.2* 8.6*   PT/INR Recent Labs    11/02/24 2153  LABPROT 17.5*  INR 1.4*   ABG Recent Labs    11/03/24 0215 11/03/24 0439  PHART 7.16* 7.3*  HCO3 15.0* 18.3*    MEDS, Scheduled  Chlorhexidine  Gluconate Cloth  6 each Topical Q2200   enoxaparin  (LOVENOX ) injection  30 mg Subcutaneous Q24H   insulin  aspart  0-9 Units Subcutaneous Q4H   mupirocin  ointment  1 Application Nasal BID   pantoprazole  (PROTONIX ) IV  40 mg Intravenous Q12H    Studies/Results:   Assessment: s/p Procedures: LAPAROTOMY, EXPLORATORY AND OMENTAL PATCH REPAIR OF PERFORATION OF GASTROJEJUNOSTOMY Patient Active Problem List   Diagnosis Date Noted   Bowel perforation (HCC) 11/03/2024   Small bowel perforation (HCC) 11/03/2024   Perforated gastric ulcer (HCC) 11/03/2024   Tachycardia 10/15/2024   Tremor 09/03/2024   Anxiety  09/03/2024   Opioid dependence with withdrawal (HCC) 05/20/2023   Deep vein thrombosis (DVT) of proximal vein of both lower extremities, unspecified chronicity (HCC) 05/20/2023   Elevated BP without diagnosis of hypertension 11/12/2021   Dysuria 10/16/2019   Suspected COVID-19 virus infection 10/16/2019   Closed nondisplaced intertrochanteric fracture of right femur (HCC) 05/31/2019   Closed displaced comminuted supracondylar fracture of left humerus without intercondylar fracture 04/17/2019   Nonfunctioning kidney 12/25/2018   Hydronephrosis with ureteral stricture, not elsewhere classified 12/21/2018   Malnutrition of moderate degree 12/08/2018   Delirium 12/07/2018   Hydronephrosis of left kidney 12/07/2018   Pyelonephritis 12/07/2018   Protein-calorie malnutrition, severe 08/24/2018   Pressure injury of skin 08/22/2018   Obstruction of left ureteropelvic junction (UPJ) due to stone 08/21/2018   Hypoalbuminemia 08/21/2018   Chronic pain disorder 08/21/2018   Thrombocytopenia 08/21/2018   Acute renal failure (ARF) 08/21/2018   Compression fracture of spine (HCC) 08/21/2018   Acute respiratory failure with hypoxemia (HCC)    Hematoma 06/03/2018   Dysphagia 02/07/2018   Generalized anxiety disorder 07/04/2017   Nonintractable headache 07/04/2017   Exertional dyspnea 08/05/2016   Sepsis (HCC) 03/07/2015   Hypokalemia 03/07/2015   Acute encephalopathy 03/06/2015   Fever 03/06/2015   Chronic back pain 01/15/2015   Obesity (BMI 30-39.9) 10/15/2013   Pulmonary nodules 10/15/2013   CAP (community acquired pneumonia) 10/02/2013   Primary hypertension 08/24/2013   Idiopathic scoliosis 04/19/2013  Back pain 04/19/2013   Osteoporosis 04/19/2013   Edema 04/19/2013   Mild diastolic dysfunction 01/01/2013   Leg pain, bilateral 12/16/2012   Attention deficit disorder (ADD) in adult 05/24/2012   Involuntary movements 05/24/2012   DIZZINESS 12/31/2009   TREMOR, ESSENTIAL 12/12/2009    PALPITATIONS, RECURRENT 12/12/2009   OTHER VITAMIN B12 DEFICIENCY ANEMIA 10/30/2009   FEMALE STRESS INCONTINENCE 10/28/2009   MEMORY LOSS 10/28/2009   GERD 12/03/2008   ANEMIA, MILD 06/26/2008   DEPRESSION/ANXIETY 12/28/2007   PICA 12/28/2007   ACUTE BRONCHITIS 12/28/2007   FATIGUE 12/28/2007   Hyperlipidemia 05/15/2007   ARTIFICIAL MENOPAUSE 05/15/2007   Myalgia and myositis, unspecified 05/15/2007   Headache 05/15/2007   CHEST PAIN, ATYPICAL 05/15/2007   SYMPTOM, PAIN, ABDOMINAL, EPIGASTRIC 05/15/2007   Personal history presenting hazards to health 05/15/2007   PSTPRC STATUS, BARIATRIC SURGERY 05/15/2007    Pt s/p RNYGB ~20 yrs ago with GJ perforation treated with omental patch  Plan: Medical management per CCM Cont NG Cont protonix  BID Monitor drain output Cont IV abx: Linezolid  and Flagyl  Pack wound BID   LOS: 1 day     .Bernarda JAYSON Ned, MD [facility], [place]    11/04/2024 7:42 AM

## 2024-11-04 NOTE — Anesthesia Postprocedure Evaluation (Signed)
"   Anesthesia Post Note  Patient: [name]  Procedure(s) Performed: LAPAROTOMY, EXPLORATORY AND OMENTAL PATCH REPAIR OF PERFORATION OF GASTROJEJUNOSTOMY     Anesthesia Type: General Anesthetic complications: no   No notable events documented.  Last Vitals:  Vitals:   11/04/24 0600 11/04/24 0645  BP: (!) 170/96   Pulse: (!) 114 (!) 123  Resp: (!) 22 20  Temp:    SpO2: 93% 96%    Last Pain:  Vitals:   11/04/24 0645  TempSrc:   PainSc: 10-Worst pain ever                 Butler Levander Pinal      "

## 2024-11-04 NOTE — Plan of Care (Signed)
" °  Problem: Clinical Measurements: Goal: Respiratory complications will improve 11/04/2024 0421 by Kobe Jansma T, RN Outcome: Not Progressing 11/04/2024 0417 by Carina Chaplin T, RN Outcome: Not Progressing Goal: Cardiovascular complication will be avoided 11/04/2024 0421 by Silas Muff T, RN Outcome: Not Progressing 11/04/2024 0417 by Frenchie Dangerfield T, RN Outcome: Not Progressing   "

## 2024-11-04 NOTE — Plan of Care (Signed)
 Difficulty controlling pain today  Problem: Activity: Goal: Risk for activity intolerance will decrease Outcome: Progressing   Problem: Nutrition: Goal: Adequate nutrition will be maintained Outcome: Not Progressing   Problem: Health Behavior/Discharge Planning: Goal: Ability to manage health-related needs will improve Outcome: Not Progressing   Problem: Pain Managment: Goal: General experience of comfort will improve and/or be controlled Outcome: Not Progressing

## 2024-11-05 ENCOUNTER — Encounter (HOSPITAL_COMMUNITY): Payer: Self-pay | Admitting: General Surgery

## 2024-11-05 DIAGNOSIS — A419 Sepsis, unspecified organism: Secondary | ICD-10-CM | POA: Diagnosis not present

## 2024-11-05 DIAGNOSIS — I129 Hypertensive chronic kidney disease with stage 1 through stage 4 chronic kidney disease, or unspecified chronic kidney disease: Secondary | ICD-10-CM | POA: Diagnosis not present

## 2024-11-05 DIAGNOSIS — K631 Perforation of intestine (nontraumatic): Secondary | ICD-10-CM | POA: Diagnosis not present

## 2024-11-05 DIAGNOSIS — J9601 Acute respiratory failure with hypoxia: Secondary | ICD-10-CM | POA: Diagnosis not present

## 2024-11-05 LAB — GLUCOSE, CAPILLARY
Glucose-Capillary: 97 mg/dL (ref 70–99)
Glucose-Capillary: 94 mg/dL (ref 70–99)
Glucose-Capillary: 116 mg/dL — ABNORMAL HIGH (ref 70–99)
Glucose-Capillary: 110 mg/dL — ABNORMAL HIGH (ref 70–99)
Glucose-Capillary: 116 mg/dL — ABNORMAL HIGH (ref 70–99)
Glucose-Capillary: 136 mg/dL — ABNORMAL HIGH (ref 70–99)

## 2024-11-05 LAB — COMPREHENSIVE METABOLIC PANEL WITH GFR
ALT: 27 U/L (ref 0–44)
AST: 53 U/L — ABNORMAL HIGH (ref 15–41)
Albumin: 2.8 g/dL — ABNORMAL LOW (ref 3.5–5.0)
Alkaline Phosphatase: 105 U/L (ref 38–126)
Anion gap: 15 (ref 5–15)
BUN: 23 mg/dL (ref 8–23)
CO2: 22 mmol/L (ref 22–32)
Calcium: 8.7 mg/dL — ABNORMAL LOW (ref 8.9–10.3)
Chloride: 106 mmol/L (ref 98–111)
Creatinine, Ser: 1.16 mg/dL — ABNORMAL HIGH (ref 0.44–1.00)
GFR, Estimated: 49 mL/min — ABNORMAL LOW
Glucose, Bld: 102 mg/dL — ABNORMAL HIGH (ref 70–99)
Potassium: 3.5 mmol/L (ref 3.5–5.1)
Sodium: 142 mmol/L (ref 135–145)
Total Bilirubin: 1.3 mg/dL — ABNORMAL HIGH (ref 0.0–1.2)
Total Protein: 5.9 g/dL — ABNORMAL LOW (ref 6.5–8.1)

## 2024-11-05 LAB — PHOSPHORUS: Phosphorus: 1.8 mg/dL — ABNORMAL LOW (ref 2.5–4.6)

## 2024-11-05 LAB — MAGNESIUM: Magnesium: 2.1 mg/dL (ref 1.7–2.4)

## 2024-11-05 MED ORDER — ENOXAPARIN SODIUM 40 MG/0.4ML IJ SOSY
40.0000 mg | PREFILLED_SYRINGE | INTRAMUSCULAR | Status: DC
  Administered 2024-11-06: 40 mg via SUBCUTANEOUS
  Filled 2024-11-05: qty 0.4

## 2024-11-05 MED ORDER — POTASSIUM PHOSPHATES 15 MMOLE/5ML IV SOLN
21.0000 mmol | Freq: Once | INTRAVENOUS | Status: AC
  Administered 2024-11-05: 21 mmol via INTRAVENOUS
  Filled 2024-11-05: qty 7

## 2024-11-05 MED ORDER — LABETALOL HCL 5 MG/ML IV SOLN
20.0000 mg | INTRAVENOUS | Status: DC | PRN
  Administered 2024-11-05 – 2024-11-07 (×4): 20 mg via INTRAVENOUS
  Filled 2024-11-05 (×4): qty 4

## 2024-11-05 MED ORDER — DEXMEDETOMIDINE HCL IN NACL 400 MCG/100ML IV SOLN
0.0000 ug/kg/h | INTRAVENOUS | Status: DC
  Administered 2024-11-05 – 2024-11-06 (×2): 0.4 ug/kg/h via INTRAVENOUS
  Filled 2024-11-05 (×2): qty 100

## 2024-11-05 MED ORDER — LABETALOL HCL 5 MG/ML IV SOLN
20.0000 mg | Freq: Four times a day (QID) | INTRAVENOUS | Status: DC | PRN
  Administered 2024-11-05: 20 mg via INTRAVENOUS
  Filled 2024-11-05: qty 4

## 2024-11-05 NOTE — Progress Notes (Signed)
 Pharmacy Electrolyte Replacement  Recent Labs:  Recent Labs    11/05/24 0759  K 3.5  MG 2.1  PHOS 1.8*  CREATININE 1.16*    Low Critical Values (K </= 2.5, Phos </= 1, Mg </= 1) Present: None  Plan: KPhos 21 mmol IV x1  Wanda Hasting PharmD, BCPS [facility] main pharmacy [phone] 11/05/2024 1:00 PM

## 2024-11-05 NOTE — Progress Notes (Signed)
 PT Cancellation Note  Patient Details Name: [name] MRN: [id number] DOB: Jan 03, 1946   Cancelled Treatment:    Reason Eval/Treat Not Completed: Medical issues which prohibited therapy, lethargic, tachypneic, will follow. Darice Potters PT Acute Rehabilitation Services Office [phone]    Potters Darice Norris 11/05/2024, 11:32 AM

## 2024-11-05 NOTE — Progress Notes (Signed)
 eLink Physician-Brief Progress Note Patient Name: [name] DOB: 1946-08-05 MRN: [id number]   Date of Service  11/05/2024  HPI/Events of Note  RN reports patient complaining of pain.  She has received fentanyl , midazolam  and morphine  but is still complaining of pain with increased heart rate and blood pressure.  eICU Interventions  Chart reviewed.  Two-way audiovisual interaction with patient.  She is reciting the Lord's prayer over and over again.  She appears more encephalopathic than in pain. She has morphine  ordered every hour as needed.  Will start her on dexmedetomidine  for encephalopathy and also to augment her analgesia.  Discussed with RN initially over the phone and then very A/V interaction at patient's bedside.     Intervention Category Major Interventions: Delirium, psychosis, severe agitation - evaluation and management  Jerilynn Berg 11/05/2024, 6:20 AM

## 2024-11-05 NOTE — Inpatient Diabetes Management (Signed)
 Inpatient Diabetes Program Recommendations  AACE/ADA: New Consensus Statement on Inpatient Glycemic Control   Target Ranges:  Prepandial:   less than 140 mg/dL      Peak postprandial:   less than 180 mg/dL (1-2 hours)      Critically ill patients:  140 - 180 mg/dL    Latest Reference Range & Units 11/04/24 04:09 11/04/24 16:40 11/04/24 16:44 11/04/24 20:08 11/04/24 23:13 11/05/24 04:00 11/05/24 07:22  Glucose-Capillary 70 - 99 mg/dL 87 91 876 (H) 64 (L) 98 97 94   Review of Glycemic Control  Diabetes history: No Outpatient Diabetes medications: NA Current orders for Inpatient glycemic control: Novolog  0-9 units Q4H  Inpatient Diabetes Program Recommendations:    Insulin : CBG down to 64 mg/dl after getting Novolog  1 unit for correction. May want to consider decreasing Novolog  correction to 0-6 units Q4H.  Thanks, Earnie Gainer, RN, MSN, CDCES Diabetes Coordinator Inpatient Diabetes Program [phone] (Team Pager from 8am to 5pm)

## 2024-11-05 NOTE — Progress Notes (Signed)
" °   11/05/24 1800  Spiritual Encounters  Type of Visit Initial  Care provided to: Pt not available  Conversation partners present during encounter Nurse   Chaplain made attempt to see patient this early evening. She was asleep.  Will return tomorrow monring.   "

## 2024-11-05 NOTE — Progress Notes (Signed)
 "  NAME:  [name], MRN:  [id number], DOB:  Mar 10, 1946, LOS: 2 ADMISSION DATE:  11/02/2024, CONSULTATION DATE:  11/03/2024 REFERRING MD:  Deward Null III, MD, CHIEF COMPLAINT:  Abdominal pain   History of Present Illness:  78 y/o female with PMH for HTN, Tremor, CKD, Malnutrition, Depression, One Kidney, Spinal cord stimulator, forward flexed neck who presented with abd pain.  Appararently agitated and altered. CT abd showing large amount of free air consistent wiuth bowel perforation.  She was emergently taken to the OR.  She underwent an ex-lap and found to have perforation at the gastrojejunostomy.  She had an omental patch repair.  Pertinent  Medical History   HTN, Tremor, CKD, Malnutrition, Depression, One Kidney, Spinal cord stimulator, forward flexed neck  Significant Hospital Events: Including procedures, antibiotic start and stop dates in addition to other pertinent events   12/27: admit to ICU post op, extubated  Interim History / Subjective:   Patient with intermittent pain and agitation, placed on precedex  overnight No complaints this morning, remains tachycardic and tachypneic. Afebrile   Objective    Blood pressure (!) 173/114, pulse (!) 127, temperature 98 F (36.7 C), temperature source Oral, resp. rate (!) 26, height 5' 5 (1.651 m), weight 68.4 kg, SpO2 95%.        Intake/Output Summary (Last 24 hours) at 11/05/2024 9266 Last data filed at 11/05/2024 0636 Gross per 24 hour  Intake 1819.17 ml  Output 2125 ml  Net -305.83 ml   Filed Weights   11/03/24 0437 11/04/24 0400 11/05/24 0500  Weight: 61.4 kg 61.4 kg 68.4 kg    Examination: General: no acute distress, elderly woman, laying in bed HENT: [place]/AT, moist mucous membranes Lungs: clear to auscultation although poor effort, no wheezes, no rales Cardiovascular: tachycardic Abdomen: soft, JP drain - serosanguinous, no BS Extremities: no edema or clubbing or cyanosis Neuro: PERRL, following  commands   Resolved problem list  AKI on CKD AGMA Lactic Acidosis  Assessment and Plan  Acute hypoxic respiratory failure - extubated 12/27, remains on nasal canula - goal SpO2 92% or higher  Small bowel perforation  - S/p ex-lap and omental patch  - Abd closed with JP drain - PRN dilaudid  for pain - management per surgery - NG to low intermittent suction  Sepsis due to acute abdomen - continue zyvox , zosyn  - plan for 5 days per surgery - follow up cultures  HTN - Prn hydralazine  and labetalol  - will resume home antihypertensives when able to access GI tract  Malnutrition - tube feeds when cleared by surgery  Mild Anemia Thrombocytopenia - trend  Labs   CBC: Recent Labs  Lab 11/02/24 2013 11/02/24 2016 11/03/24 0339 11/03/24 0539 11/03/24 2307 11/04/24 0753  WBC 8.5  --  4.6 3.7* 5.6 6.1  NEUTROABS 7.1  --  3.9  --   --  4.1  HGB 13.8 15.3* 11.5* 10.1* 11.3* 11.9*  HCT 44.7 45.0 37.5 33.3* 34.1* 35.0*  MCV 110.4*  --  110.3* 112.9* 103.0* 100.6*  PLT 241  --  142* 151 128* 94*    Basic Metabolic Panel: Recent Labs  Lab 11/02/24 2013 11/02/24 2016 11/03/24 0339 11/03/24 0539 11/03/24 1456 11/04/24 0753  NA 142 139 142 140 139 141  K 4.2 5.5* 4.4 4.1 4.6 3.8  CL 106 110 109 108 103 104  CO2 16*  --  15* 15* 21* 24  GLUCOSE 61* 58* 39* 70 104* 99  BUN 39* 64* 37* 35* 31* 27*  CREATININE 2.53* 2.90* 2.17* 2.32* 1.86* 1.44*  CALCIUM 9.2  --  8.5* 8.2* 8.6* 8.8*  MG  --   --  2.0 2.1  --  2.0  PHOS  --   --  4.7* 4.1  --   --    GFR: Estimated Creatinine Clearance: 32.3 mL/min (A) (by C-G formula based on SCr of 1.44 mg/dL (H)). Recent Labs  Lab 11/02/24 2015 11/02/24 2202 11/03/24 0339 11/03/24 0539 11/03/24 2307 11/04/24 0753  WBC  --   --  4.6 3.7* 5.6 6.1  LATICACIDVEN 4.4* 2.8* 4.6* 4.7*  --   --     Liver Function Tests: Recent Labs  Lab 11/02/24 2013 11/03/24 0339  AST 63* 126*  ALT 13 22  ALKPHOS 105 85  BILITOT 0.6 0.7   PROT 7.9 6.1*  ALBUMIN  4.0 3.1*   No results for input(s): LIPASE, AMYLASE in the last 168 hours. No results for input(s): AMMONIA in the last 168 hours.  ABG    Component Value Date/Time   PHART 7.3 (L) 11/03/2024 0439   PCO2ART 37 11/03/2024 0439   PO2ART 146 (H) 11/03/2024 0439   HCO3 18.3 (L) 11/03/2024 0439   TCO2 19 (L) 11/02/2024 2016   ACIDBASEDEF 7.7 (H) 11/03/2024 0439   O2SAT 100 11/03/2024 0439     Coagulation Profile: Recent Labs  Lab 11/02/24 2153  INR 1.4*    Cardiac Enzymes: No results for input(s): CKTOTAL, CKMB, CKMBINDEX, TROPONINI in the last 168 hours.  HbA1C: Hgb A1c MFr Bld  Date/Time Value Ref Range Status  11/03/2024 03:39 AM 5.2 4.8 - 5.6 % Final    Comment:    (NOTE) Diagnosis of Diabetes The following HbA1c ranges recommended by the American Diabetes Association (ADA) may be used as an aid in the diagnosis of diabetes mellitus.  Hemoglobin             Suggested A1C NGSP%              Diagnosis  <5.7                   Non Diabetic  5.7-6.4                Pre-Diabetic  >6.4                   Diabetic  <7.0                   Glycemic control for                       adults with diabetes.    04/22/2014 11:53 AM 5.3 4.6 - 6.5 % Final    Comment:    Glycemic Control Guidelines for People with Diabetes:Non Diabetic:  <6%Goal of Therapy: <7%Additional Action Suggested:  >8%     CBG: Recent Labs  Lab 11/04/24 1644 11/04/24 2008 11/04/24 2313 11/05/24 0400 11/05/24 0722  GLUCAP 123* 64* 98 97 94      Critical care time: n/a    Dorn Chill, MD [facility] Office: [phone]   See Amion for personal pager PCCM on call pager [phone] until 7pm. Please call Elink 7p-7a. [phone]          "

## 2024-11-05 NOTE — Progress Notes (Signed)
 3 Days Post-Op   Subjective/Chief Complaint: Moans when asked questions   Objective: Vital signs in last 24 hours: Temp:  [98 F (36.7 C)-99.5 F (37.5 C)] 98 F (36.7 C) (12/29 0340) Pulse Rate:  [104-127] 127 (12/29 0600) Resp:  [11-39] 26 (12/29 0600) BP: (155-186)/(86-120) 173/114 (12/29 0600) SpO2:  [94 %-100 %] 95 % (12/29 0600) Weight:  [68.4 kg] 68.4 kg (12/29 0500)    Intake/Output from previous day: 12/28 0701 - 12/29 0700 In: 1819.2 [I.V.:1306.7; IV Piggyback:512.5] Out: 2125 [Urine:1825; Emesis/NG output:150; Drains:150] Intake/Output this shift: No intake/output data recorded.  General ill appearing GI wound clean, approp tender, jp ss  Lab Results:  Recent Labs    11/03/24 2307 11/04/24 0753  WBC 5.6 6.1  HGB 11.3* 11.9*  HCT 34.1* 35.0*  PLT 128* 94*   BMET Recent Labs    11/03/24 1456 11/04/24 0753  NA 139 141  K 4.6 3.8  CL 103 104  CO2 21* 24  GLUCOSE 104* 99  BUN 31* 27*  CREATININE 1.86* 1.44*  CALCIUM 8.6* 8.8*   PT/INR Recent Labs    11/02/24 2153  LABPROT 17.5*  INR 1.4*   ABG Recent Labs    11/03/24 0215 11/03/24 0439  PHART 7.16* 7.3*  HCO3 15.0* 18.3*    Studies/Results: DG Abd Portable 1V Result Date: 11/03/2024 CLINICAL DATA:  NG tube placement. EXAM: PORTABLE ABDOMEN - 1 VIEW COMPARISON:  July 14, 2017 FINDINGS: A nasogastric tube is in place with its distal tip overlying the expected region of the gastric antrum. Mild to moderate severity areas of scarring, atelectasis and/or infiltrate are seen within the bilateral lung bases, left greater than right. The bowel gas pattern is normal. No radio-opaque calculi or other significant radiographic abnormality are seen. IMPRESSION: Nasogastric tube positioning, as described above. Electronically Signed   By: Suzen Dials M.D.   On: 11/03/2024 13:22    Anti-infectives: Anti-infectives (From admission, onward)    Start     Dose/Rate Route Frequency Ordered  Stop   11/05/24 0800  vancomycin  (VANCOREADY) IVPB 750 mg/150 mL  Status:  Discontinued        750 mg 150 mL/hr over 60 Minutes Intravenous Every 48 hours 11/03/24 0725 11/03/24 0751   11/04/24 2200  vancomycin  (VANCOREADY) IVPB 750 mg/150 mL  Status:  Discontinued        750 mg 150 mL/hr over 60 Minutes Intravenous Every 48 hours 11/02/24 2154 11/03/24 0725   11/03/24 2200  piperacillin -tazobactam (ZOSYN ) IVPB 3.375 g        3.375 g 12.5 mL/hr over 240 Minutes Intravenous Every 8 hours 11/03/24 1903 11/08/24 2159   11/03/24 0845  linezolid  (ZYVOX ) IVPB 600 mg        600 mg 300 mL/hr over 60 Minutes Intravenous Every 12 hours 11/03/24 0751     11/03/24 0815  vancomycin  (VANCOCIN ) IVPB 1000 mg/200 mL premix  Status:  Discontinued        1,000 mg 200 mL/hr over 60 Minutes Intravenous  Once 11/03/24 0725 11/03/24 0751   11/03/24 0415  metroNIDAZOLE  (FLAGYL ) IVPB 500 mg  Status:  Discontinued        500 mg 100 mL/hr over 60 Minutes Intravenous Every 12 hours 11/03/24 0315 11/03/24 0751   11/03/24 0415  piperacillin -tazobactam (ZOSYN ) IVPB 2.25 g  Status:  Discontinued        2.25 g 100 mL/hr over 30 Minutes Intravenous Every 8 hours 11/03/24 0319 11/03/24 1903   11/02/24 2130  vancomycin  (  VANCOCIN ) IVPB 1000 mg/200 mL premix  Status:  Discontinued        1,000 mg 200 mL/hr over 60 Minutes Intravenous  Once 11/02/24 2122 11/03/24 0725   11/02/24 2030  ceFEPIme  (MAXIPIME ) 2 g in sodium chloride  0.9 % 100 mL IVPB        2 g 200 mL/hr over 30 Minutes Intravenous  Once 11/02/24 2018 11/02/24 2125   11/02/24 2030  metroNIDAZOLE  (FLAGYL ) IVPB 500 mg        500 mg 100 mL/hr over 60 Minutes Intravenous  Once 11/02/24 2018 11/03/24 2000       Assessment/Plan: POD 2 omental patch of perforation of GJ (prior bypass)- PT -not sure about bowel function yet -continue ng tube, will do UGI tomorrow if she can tolerate -continue npo and hold tube feeds -lovenox  for dvt  prophylaxis -linezolid /flagyl  needs five days postop from my standpoint -protonix  bid -appreciate ccm care  [name] 11/05/2024

## 2024-11-06 ENCOUNTER — Inpatient Hospital Stay (HOSPITAL_COMMUNITY)

## 2024-11-06 DIAGNOSIS — K631 Perforation of intestine (nontraumatic): Secondary | ICD-10-CM | POA: Diagnosis not present

## 2024-11-06 DIAGNOSIS — G8929 Other chronic pain: Secondary | ICD-10-CM

## 2024-11-06 DIAGNOSIS — A419 Sepsis, unspecified organism: Secondary | ICD-10-CM | POA: Diagnosis not present

## 2024-11-06 DIAGNOSIS — I129 Hypertensive chronic kidney disease with stage 1 through stage 4 chronic kidney disease, or unspecified chronic kidney disease: Secondary | ICD-10-CM | POA: Diagnosis not present

## 2024-11-06 DIAGNOSIS — J9601 Acute respiratory failure with hypoxia: Secondary | ICD-10-CM | POA: Diagnosis not present

## 2024-11-06 LAB — CBC WITH DIFFERENTIAL/PLATELET
Basophils Absolute: 0 K/uL (ref 0.0–0.1)
Basophils Relative: 0 %
Eosinophils Absolute: 0 K/uL (ref 0.0–0.5)
Eosinophils Relative: 0 %
HCT: 33.2 % — ABNORMAL LOW (ref 36.0–46.0)
Hemoglobin: 11.2 g/dL — ABNORMAL LOW (ref 12.0–15.0)
Lymphocytes Relative: 8 %
Lymphs Abs: 0.6 K/uL — ABNORMAL LOW (ref 0.7–4.0)
MCH: 33.8 pg (ref 26.0–34.0)
MCHC: 33.7 g/dL (ref 30.0–36.0)
MCV: 100.3 fL — ABNORMAL HIGH (ref 80.0–100.0)
Monocytes Absolute: 0 K/uL — ABNORMAL LOW (ref 0.1–1.0)
Monocytes Relative: 0 %
Neutro Abs: 7 K/uL (ref 1.7–7.7)
Neutrophils Relative %: 92 %
Platelets: 30 K/uL — ABNORMAL LOW (ref 150–400)
RBC: 3.31 MIL/uL — ABNORMAL LOW (ref 3.87–5.11)
RDW: 15.9 % — ABNORMAL HIGH (ref 11.5–15.5)
Smear Review: NORMAL
WBC: 7.6 K/uL (ref 4.0–10.5)
nRBC: 2 % — ABNORMAL HIGH (ref 0.0–0.2)

## 2024-11-06 LAB — COMPREHENSIVE METABOLIC PANEL WITH GFR
ALT: 16 U/L (ref 0–44)
AST: 24 U/L (ref 15–41)
Albumin: 2.6 g/dL — ABNORMAL LOW (ref 3.5–5.0)
Alkaline Phosphatase: 94 U/L (ref 38–126)
Anion gap: 12 (ref 5–15)
BUN: 22 mg/dL (ref 8–23)
CO2: 23 mmol/L (ref 22–32)
Calcium: 8.3 mg/dL — ABNORMAL LOW (ref 8.9–10.3)
Chloride: 105 mmol/L (ref 98–111)
Creatinine, Ser: 1.1 mg/dL — ABNORMAL HIGH (ref 0.44–1.00)
GFR, Estimated: 52 mL/min — ABNORMAL LOW
Glucose, Bld: 89 mg/dL (ref 70–99)
Potassium: 3.3 mmol/L — ABNORMAL LOW (ref 3.5–5.1)
Sodium: 141 mmol/L (ref 135–145)
Total Bilirubin: 1.3 mg/dL — ABNORMAL HIGH (ref 0.0–1.2)
Total Protein: 5.4 g/dL — ABNORMAL LOW (ref 6.5–8.1)

## 2024-11-06 LAB — GLUCOSE, CAPILLARY
Glucose-Capillary: 101 mg/dL — ABNORMAL HIGH (ref 70–99)
Glucose-Capillary: 108 mg/dL — ABNORMAL HIGH (ref 70–99)
Glucose-Capillary: 77 mg/dL (ref 70–99)
Glucose-Capillary: 79 mg/dL (ref 70–99)
Glucose-Capillary: 82 mg/dL (ref 70–99)
Glucose-Capillary: 86 mg/dL (ref 70–99)

## 2024-11-06 LAB — PHOSPHORUS: Phosphorus: 2.5 mg/dL (ref 2.5–4.6)

## 2024-11-06 LAB — MAGNESIUM: Magnesium: 1.9 mg/dL (ref 1.7–2.4)

## 2024-11-06 MED ORDER — IOHEXOL 300 MG/ML  SOLN
50.0000 mL | Freq: Once | INTRAMUSCULAR | Status: AC | PRN
  Administered 2024-11-06: 50 mL

## 2024-11-06 MED ORDER — HALOPERIDOL LACTATE 5 MG/ML IJ SOLN: 5.0000 mg | Freq: Four times a day (QID) | INTRAMUSCULAR | Status: DC | PRN

## 2024-11-06 MED ORDER — POTASSIUM CHLORIDE 20 MEQ PO PACK
40.0000 meq | PACK | Freq: Once | ORAL | Status: AC
  Administered 2024-11-06: 40 meq via ORAL
  Filled 2024-11-06: qty 2

## 2024-11-06 NOTE — Progress Notes (Signed)
 4 Days Post-Op   Subjective/Chief Complaint: Responsive, no complaints   Objective: Vital signs in last 24 hours: Temp:  [98.6 F (37 C)-98.9 F (37.2 C)] 98.9 F (37.2 C) (12/30 0334) Pulse Rate:  [76-116] 94 (12/30 0500) Resp:  [18-36] 27 (12/30 0500) BP: (129-180)/(67-104) 151/72 (12/30 0500) SpO2:  [88 %-100 %] 100 % (12/30 0500) Weight:  [66.2 kg] 66.2 kg (12/30 0500)    Intake/Output from previous day: 12/29 0701 - 12/30 0700 In: 1931.9 [I.V.:743.1; IV Piggyback:1188.8] Out: 1325 [Urine:1150; Emesis/NG output:50; Drains:125] Intake/Output this shift: No intake/output data recorded.  Cv regular Pulm decreased bases Ab wound clean, approp tender, jp serosang  Lab Results:  Recent Labs    11/04/24 0753 11/06/24 0328  WBC 6.1 7.6  HGB 11.9* 11.2*  HCT 35.0* 33.2*  PLT 94* 30*   BMET Recent Labs    11/04/24 0753 11/05/24 0759  NA 141 142  K 3.8 3.5  CL 104 106  CO2 24 22  GLUCOSE 99 102*  BUN 27* 23  CREATININE 1.44* 1.16*  CALCIUM 8.8* 8.7*   PT/INR No results for input(s): LABPROT, INR in the last 72 hours. ABG No results for input(s): PHART, HCO3 in the last 72 hours.  Invalid input(s): PCO2, PO2  Studies/Results: No results found.  Anti-infectives: Anti-infectives (From admission, onward)    Start     Dose/Rate Route Frequency Ordered Stop   11/05/24 0800  vancomycin  (VANCOREADY) IVPB 750 mg/150 mL  Status:  Discontinued        750 mg 150 mL/hr over 60 Minutes Intravenous Every 48 hours 11/03/24 0725 11/03/24 0751   11/04/24 2200  vancomycin  (VANCOREADY) IVPB 750 mg/150 mL  Status:  Discontinued        750 mg 150 mL/hr over 60 Minutes Intravenous Every 48 hours 11/02/24 2154 11/03/24 0725   11/03/24 2200  piperacillin -tazobactam (ZOSYN ) IVPB 3.375 g        3.375 g 12.5 mL/hr over 240 Minutes Intravenous Every 8 hours 11/03/24 1903 11/08/24 2159   11/03/24 0845  linezolid  (ZYVOX ) IVPB 600 mg        600 mg 300 mL/hr over 60  Minutes Intravenous Every 12 hours 11/03/24 0751     11/03/24 0815  vancomycin  (VANCOCIN ) IVPB 1000 mg/200 mL premix  Status:  Discontinued        1,000 mg 200 mL/hr over 60 Minutes Intravenous  Once 11/03/24 0725 11/03/24 0751   11/03/24 0415  metroNIDAZOLE  (FLAGYL ) IVPB 500 mg  Status:  Discontinued        500 mg 100 mL/hr over 60 Minutes Intravenous Every 12 hours 11/03/24 0315 11/03/24 0751   11/03/24 0415  piperacillin -tazobactam (ZOSYN ) IVPB 2.25 g  Status:  Discontinued        2.25 g 100 mL/hr over 30 Minutes Intravenous Every 8 hours 11/03/24 0319 11/03/24 1903   11/02/24 2130  vancomycin  (VANCOCIN ) IVPB 1000 mg/200 mL premix  Status:  Discontinued        1,000 mg 200 mL/hr over 60 Minutes Intravenous  Once 11/02/24 2122 11/03/24 0725   11/02/24 2030  ceFEPIme  (MAXIPIME ) 2 g in sodium chloride  0.9 % 100 mL IVPB        2 g 200 mL/hr over 30 Minutes Intravenous  Once 11/02/24 2018 11/02/24 2125   11/02/24 2030  metroNIDAZOLE  (FLAGYL ) IVPB 500 mg        500 mg 100 mL/hr over 60 Minutes Intravenous  Once 11/02/24 2018 11/03/24 2000  Assessment/Plan: POD 3 omental patch of perforation of GJ (prior bypass)- PT -continue ng tube, will do UGI today to see if leakage, if not can dc ng and try ice chips -continue npo and hold tube feeds pending ugi -lovenox  for dvt prophylaxis -linezolid /flagyl  needs five days postop from my standpoint -protonix  bid -appreciate ccm care  [name] 11/06/2024

## 2024-11-06 NOTE — Evaluation (Signed)
 Physical Therapy Evaluation Patient Details Name: [name] MRN: [id number] DOB: 22-Sep-1946 Today's Date: 11/06/2024  History of Present Illness  Pt is a 78 y.o. female who presented abdominal pain.CT abd showing large amount of free air consistent wiuth bowel perforation.  She was emergently taken to the OR.  She underwent an ex-lap and found to have perforation at the gastrojejunostomy.  She had an omental patch repair.PMH: HTN, tremor, depression, 1 kidney, CKD, severe malnutrition, bypass 2004, spinal cord stimulator 2016, scoliosis  Clinical Impression  Pt admitted with above diagnosis.  Pt currently with functional limitations due to the deficits listed below (see PT Problem List). Pt will benefit from acute skilled PT to increase their independence and safety with mobility to allow discharge.     Patient alert and able to participate although significantly limited in ability to assist with mobility, requiring 2 person total assistance to mobilize to sitting, demonstrates poor balance control with sever L head tilt to the left.  Patient was ambulatory only short distance  PTA, used WC mostly.  Patient will benefit from continued inpatient follow up therapy, <3 hours/day.  SPo2 95% on 2 L, HR 104, RR at times in 30's, patient would  say breathe in  and out and attempt to slow breaths down.        If plan is discharge home, recommend the following: Two people to help with walking and/or transfers;Two people to help with bathing/dressing/bathroom;Help with stairs or ramp for entrance   Can travel by private vehicle        Equipment Recommendations None recommended by PT  Recommendations for Other Services       Functional Status Assessment Patient has had a recent decline in their functional status and demonstrates the ability to make significant improvements in function in a reasonable and predictable amount of time.     Precautions / Restrictions Precautions Precautions:  Fall Precaution/Restrictions Comments: R drain, severe scoliosis Restrictions Weight Bearing Restrictions Per Provider Order: No      Mobility  Bed Mobility   Bed Mobility: Supine to Sit, Sit to Supine, Rolling Rolling: Max assist, +2 for physical assistance, +2 for safety/equipment   Supine to sit: Total assist, +2 for physical assistance, +2 for safety/equipment Sit to supine: Total assist, +2 for physical assistance, +2 for safety/equipment   General bed mobility comments: patient limited in assisting with mobility    Transfers                        Ambulation/Gait                  Stairs            Wheelchair Mobility     Tilt Bed    Modified Rankin (Stroke Patients Only)       Balance Overall balance assessment: Needs assistance Sitting-balance support: Feet unsupported, Bilateral upper extremity supported Sitting balance-Leahy Scale: Zero Sitting balance - Comments: reqired assistance to   sit on bed edge, head significantly tilts to left, no balance  control                                     Pertinent Vitals/Pain Pain Assessment Pain Assessment: 0-10 Faces Pain Scale: Hurts worst Pain Location: abdomen Pain Descriptors / Indicators: Discomfort, Grimacing Pain Intervention(s): Repositioned, Patient requesting pain meds-RN notified, RN gave pain meds during session,  Monitored during session, Limited activity within patient's tolerance    Home Living Family/patient expects to be discharged to:: Private residence Living Arrangements: Spouse/significant other Available Help at Discharge: Family Type of Home: House Home Access: Level entry       Home Layout: One level Home Equipment: Agricultural Consultant (2 wheels);Grab bars - tub/shower;Wheelchair - manual;Shower seat      Prior Function Prior Level of Function : Needs assist         Mobility (physical): Transfers;Gait   Mobility Comments: Pt has mainly been  WC level-supv level for transfers; takes a couple steps into the bathroom furniture walking. Sleeps in recliner. uses a rollator  for short distances ADLs Comments: sponge bathing at sink; family assists with ADLs/IADLs as needed     Extremity/Trunk Assessment   Upper Extremity Assessment Upper Extremity Assessment: Right hand dominant;LUE deficits/detail LUE Deficits / Details: limited shoulder elevation,         Cervical / Trunk Assessment Cervical / Trunk Assessment: Kyphotic;Other exceptions Cervical / Trunk Exceptions: severe left head tilt, scoliosis  Communication   Communication Communication: Impaired Factors Affecting Communication: Hearing impaired    Cognition Arousal: Alert Behavior During Therapy: WFL for tasks assessed/performed   PT - Cognitive impairments: Orientation   Orientation impairments: Time                     Following commands: Intact       Cueing Cueing Techniques: Verbal cues     General Comments      Exercises     Assessment/Plan    PT Assessment Patient needs continued PT services  PT Problem List Decreased strength;Decreased range of motion;Decreased activity tolerance;Decreased balance;Decreased mobility;Decreased knowledge of use of DME       PT Treatment Interventions DME instruction;Functional mobility training;Therapeutic activities;Therapeutic exercise;Patient/family education;Gait training;Balance training    PT Goals (Current goals can be found in the Care Plan section)  Acute Rehab PT Goals Patient Stated Goal: go home PT Goal Formulation: With patient/family Time For Goal Achievement: 11/20/24 Potential to Achieve Goals: Fair    Frequency Min 2X/week     Co-evaluation               AM-PAC PT 6 Clicks Mobility  Outcome Measure Help needed turning from your back to your side while in a flat bed without using bedrails?: Total Help needed moving from lying on your back to sitting on the side  of a flat bed without using bedrails?: Total Help needed moving to and from a bed to a chair (including a wheelchair)?: Total Help needed standing up from a chair using your arms (e.g., wheelchair or bedside chair)?: Total Help needed to walk in hospital room?: Total Help needed climbing 3-5 steps with a railing? : Total 6 Click Score: 6    End of Session   Activity Tolerance: Patient limited by fatigue;Patient limited by pain Patient left: in bed;with call bell/phone within reach;with bed alarm set Nurse Communication: Mobility status PT Visit Diagnosis: Difficulty in walking, not elsewhere classified (R26.2);Other abnormalities of gait and mobility (R26.89);Muscle weakness (generalized) (M62.81)    Time: 8781-8740 PT Time Calculation (min) (ACUTE ONLY): 41 min   Charges:   PT Evaluation $PT Eval Low Complexity: 1 Low PT Treatments $Therapeutic Activity: 23-37 mins PT General Charges $$ ACUTE PT VISIT: 1 Visit         Darice Potters PT Acute Rehabilitation Services Office [phone]   Potters Darice Norris 11/06/2024, 2:52 PM

## 2024-11-06 NOTE — TOC Initial Note (Signed)
 Transition of Care [facility]) - Initial/Assessment Note    Patient Details  Name: [name] MRN: [id number] Date of Birth: Mar 30, 1946  Transition of Care [facility]) CM/SW Contact:    Jon ONEIDA Anon, RN Phone Number: 11/06/2024, 1:08 PM  Clinical Narrative:                 Pt admitted from home. Pt came to the ED with complaints of SOB and AMS. Prior admission pt set up with [facility] PT/OT/RN through [facility].  Pt is s/p Omental patch repair of gastro-jejunal. Pt was sedated on mechanical ventilation, now extubated on 2-3L O2 [place]. Pt needing continued medical workup, not medically stable for discharge. PT/OT consulted, awaiting recommendations. ICM will continue to follow for DC planning needs.  Expected Discharge Plan: Home w Home Health Services Barriers to Discharge: Continued Medical Work up   Patient Goals and CMS Choice Patient states their goals for this hospitalization and ongoing recovery are:: Return home CMS Medicare.gov Compare Post Acute Care list provided to:: Patient Represenative (must comment) Choice offered to / list presented to : Adult Children [place] ownership interest in [facility].provided to:: Adult Children    Expected Discharge Plan and Services In-house Referral: NA Discharge Planning Services: CM Consult Post Acute Care Choice: Durable Medical Equipment, Home Health Living arrangements for the past 2 months: Single Family Home                                      Prior Living Arrangements/Services Living arrangements for the past 2 months: Single Family Home Lives with:: Spouse Patient language and need for interpreter reviewed:: Yes Do you feel safe going back to the place where you live?: Yes      Need for Family Participation in Patient Care: Yes (Comment) Care giver support system in place?: Yes (comment) Current home services: DME, Home OT, Home PT, Home RN Criminal Activity/Legal Involvement Pertinent to Current  Situation/Hospitalization: No - Comment as needed  Activities of Daily Living   ADL Screening (condition at time of admission) Independently performs ADLs?: No Does the patient have a NEW difficulty with bathing/dressing/toileting/self-feeding that is expected to last >3 days?: No Does the patient have a NEW difficulty with getting in/out of bed, walking, or climbing stairs that is expected to last >3 days?: No Does the patient have a NEW difficulty with communication that is expected to last >3 days?: No Is the patient deaf or have difficulty hearing?: Yes Does the patient have difficulty seeing, even when wearing glasses/contacts?: No Does the patient have difficulty concentrating, remembering, or making decisions?: No  Permission Sought/Granted Permission sought to share information with : Family Supports    Share Information with NAME: [name], Beam  Spouse, Emergency Contact  [phone]           Emotional Assessment Appearance:: Appears stated age       Alcohol / Substance Use: Not Applicable Psych Involvement: No (comment)  Admission diagnosis:  Intra-abdominal free air of unknown etiology [K66.8] Perforated abdominal viscus [R19.8] Bowel perforation (HCC) [K63.1] Small bowel perforation (HCC) [K63.1] Perforated gastric ulcer (HCC) [K25.5] Patient Active Problem List   Diagnosis Date Noted   Bowel perforation (HCC) 11/03/2024   Small bowel perforation (HCC) 11/03/2024   Perforated gastric ulcer (HCC) 11/03/2024   Tachycardia 10/15/2024   Tremor 09/03/2024   Anxiety 09/03/2024   Opioid dependence with withdrawal (HCC) 05/20/2023  Deep vein thrombosis (DVT) of proximal vein of both lower extremities, unspecified chronicity (HCC) 05/20/2023   Elevated BP without diagnosis of hypertension 11/12/2021   Dysuria 10/16/2019   Suspected COVID-19 virus infection 10/16/2019   Closed nondisplaced intertrochanteric fracture of right femur (HCC) 05/31/2019   Closed  displaced comminuted supracondylar fracture of left humerus without intercondylar fracture 04/17/2019   Nonfunctioning kidney 12/25/2018   Hydronephrosis with ureteral stricture, not elsewhere classified 12/21/2018   Malnutrition of moderate degree 12/08/2018   Delirium 12/07/2018   Hydronephrosis of left kidney 12/07/2018   Pyelonephritis 12/07/2018   Protein-calorie malnutrition, severe 08/24/2018   Pressure injury of skin 08/22/2018   Obstruction of left ureteropelvic junction (UPJ) due to stone 08/21/2018   Hypoalbuminemia 08/21/2018   Chronic pain disorder 08/21/2018   Thrombocytopenia 08/21/2018   Acute renal failure (ARF) 08/21/2018   Compression fracture of spine (HCC) 08/21/2018   Acute respiratory failure with hypoxemia (HCC)    Hematoma 06/03/2018   Dysphagia 02/07/2018   Generalized anxiety disorder 07/04/2017   Nonintractable headache 07/04/2017   Exertional dyspnea 08/05/2016   Sepsis (HCC) 03/07/2015   Hypokalemia 03/07/2015   Acute encephalopathy 03/06/2015   Fever 03/06/2015   Chronic back pain 01/15/2015   Obesity (BMI 30-39.9) 10/15/2013   Pulmonary nodules 10/15/2013   CAP (community acquired pneumonia) 10/02/2013   Primary hypertension 08/24/2013   Idiopathic scoliosis 04/19/2013   Back pain 04/19/2013   Osteoporosis 04/19/2013   Edema 04/19/2013   Mild diastolic dysfunction 01/01/2013   Leg pain, bilateral 12/16/2012   Attention deficit disorder (ADD) in adult 05/24/2012   Involuntary movements 05/24/2012   DIZZINESS 12/31/2009   TREMOR, ESSENTIAL 12/12/2009   PALPITATIONS, RECURRENT 12/12/2009   OTHER VITAMIN B12 DEFICIENCY ANEMIA 10/30/2009   FEMALE STRESS INCONTINENCE 10/28/2009   MEMORY LOSS 10/28/2009   GERD 12/03/2008   ANEMIA, MILD 06/26/2008   DEPRESSION/ANXIETY 12/28/2007   PICA 12/28/2007   ACUTE BRONCHITIS 12/28/2007   FATIGUE 12/28/2007   Hyperlipidemia 05/15/2007   ARTIFICIAL MENOPAUSE 05/15/2007   Myalgia and myositis,  unspecified 05/15/2007   Headache 05/15/2007   CHEST PAIN, ATYPICAL 05/15/2007   SYMPTOM, PAIN, ABDOMINAL, EPIGASTRIC 05/15/2007   Personal history presenting hazards to health 05/15/2007   PSTPRC STATUS, BARIATRIC SURGERY 05/15/2007   PCP:  Antonio Cyndee Jamee JONELLE, DO Pharmacy:   [facility] [id number] - [place], [place] - [address] [place] [address] Phone: [phone] Fax: [phone]     Social Drivers of Health (SDOH) Social History: SDOH Screenings   Food Insecurity: Patient Unable To Answer (11/03/2024)  Housing: Unknown (11/03/2024)  Transportation Needs: Patient Unable To Answer (11/03/2024)  Utilities: Patient Unable To Answer (11/03/2024)  Alcohol Screen: Low Risk (09/27/2022)  Depression (PHQ2-9): Low Risk (09/25/2024)  Recent Concern: Depression (PHQ2-9) - High Risk (08/07/2024)  Financial Resource Strain: Low Risk (03/22/2024)   Received from [facility]  Physical Activity: Insufficiently Active (09/25/2024)  Social Connections: Patient Unable To Answer (11/03/2024)  Recent Concern: Social Connections - Moderately Isolated (09/25/2024)  Stress: No Stress Concern Present (09/25/2024)  Tobacco Use: Low Risk (11/02/2024)  Health Literacy: Adequate Health Literacy (09/25/2024)   SDOH Interventions:     Readmission Risk Interventions    11/06/2024    1:04 PM  Readmission Risk Prevention Plan  Transportation Screening Complete  PCP or Specialist Appt within 3-5 Days Complete  HRI or Home Care Consult Complete  Social Work Consult for Recovery Care Planning/Counseling Complete  Palliative Care Screening  Not Applicable  Medication Review (RN Care Manager) Complete

## 2024-11-06 NOTE — Progress Notes (Signed)
 "  NAME:  [name], MRN:  [id number], DOB:  January 29, 1946, LOS: 3 ADMISSION DATE:  11/02/2024, CONSULTATION DATE:  11/03/2024 REFERRING MD:  Deward Null III, MD, CHIEF COMPLAINT:  Abdominal pain   History of Present Illness:  78 y/o female with PMH for HTN, Tremor, CKD, Malnutrition, Depression, One Kidney, Spinal cord stimulator, forward flexed neck who presented with abd pain.  Appararently agitated and altered. CT abd showing large amount of free air consistent wiuth bowel perforation.  She was emergently taken to the OR.  She underwent an ex-lap and found to have perforation at the gastrojejunostomy.  She had an omental patch repair.  Pertinent  Medical History   HTN, Tremor, CKD, Malnutrition, Depression, One Kidney, Spinal cord stimulator, forward flexed neck  Significant Hospital Events: Including procedures, antibiotic start and stop dates in addition to other pertinent events   12/27: admit to ICU post op, extubated 12/29: remains in stepdown, NG to low intermittent suction, on precedex  for agitation  Interim History / Subjective:   No issues overnight, remained on precedex  Very alert and awake this morning, responding appropriately   Objective    Blood pressure (!) 151/72, pulse 94, temperature 98.9 F (37.2 C), temperature source Oral, resp. rate (!) 27, height 5' 5 (1.651 m), weight 66.2 kg, SpO2 100%.        Intake/Output Summary (Last 24 hours) at 11/06/2024 0755 Last data filed at 11/06/2024 0620 Gross per 24 hour  Intake 1931.86 ml  Output 1325 ml  Net 606.86 ml   Filed Weights   11/04/24 0400 11/05/24 0500 11/06/24 0500  Weight: 61.4 kg 68.4 kg 66.2 kg    Examination: General: no acute distress, elderly woman, laying in bed HENT: [place]/AT, moist mucous membranes Lungs: clear to auscultation,, no wheezes, no rales Cardiovascular: rrr Abdomen: soft, JP drain - serosanguinous, no BS Extremities:  edema of hands, warm Neuro: PERRL, following  commands   Resolved problem list  AKI on CKD AGMA Lactic Acidosis  Assessment and Plan  Acute hypoxic respiratory failure - extubated 12/27, remains on nasal canula - goal SpO2 92% or higher  Small bowel perforation  - S/p ex-lap and omental patch  - Abd closed with JP drain - PRN dilaudid  for pain - management per surgery - NG to low intermittent suction - Plan for upper GI eval today, if no leak then will consider removal of NG and allow ice chips  Sepsis due to acute abdomen - continue zosyn  for 5 day course post-op, stop zyvox  - follow up cultures  Agitation - has been on precedex , will wean off today  HTN - Prn hydralazine  and labetalol  - will resume home antihypertensives when able to access GI tract  Malnutrition - tube feeds when cleared by surgery  Mild Anemia Thrombocytopenia - trend  Chronic Pain on chronic narcotics - consider adding fentanyl  patch until able to access the GI tract  Labs   CBC: Recent Labs  Lab 11/02/24 2013 11/02/24 2016 11/03/24 0339 11/03/24 0539 11/03/24 2307 11/04/24 0753 11/06/24 0328  WBC 8.5  --  4.6 3.7* 5.6 6.1 7.6  NEUTROABS 7.1  --  3.9  --   --  4.1 7.0  HGB 13.8   < > 11.5* 10.1* 11.3* 11.9* 11.2*  HCT 44.7   < > 37.5 33.3* 34.1* 35.0* 33.2*  MCV 110.4*  --  110.3* 112.9* 103.0* 100.6* 100.3*  PLT 241  --  142* 151 128* 94* 30*   < > = values in  this interval not displayed.    Basic Metabolic Panel: Recent Labs  Lab 11/03/24 0339 11/03/24 0539 11/03/24 1456 11/04/24 0753 11/05/24 0759 11/06/24 0328  NA 142 140 139 141 142  --   K 4.4 4.1 4.6 3.8 3.5  --   CL 109 108 103 104 106  --   CO2 15* 15* 21* 24 22  --   GLUCOSE 39* 70 104* 99 102*  --   BUN 37* 35* 31* 27* 23  --   CREATININE 2.17* 2.32* 1.86* 1.44* 1.16*  --   CALCIUM 8.5* 8.2* 8.6* 8.8* 8.7*  --   MG 2.0 2.1  --  2.0 2.1  --   PHOS 4.7* 4.1  --   --  1.8* 2.5   GFR: Estimated Creatinine Clearance: 37.1 mL/min (A) (by C-G formula  based on SCr of 1.16 mg/dL (H)). Recent Labs  Lab 11/02/24 2015 11/02/24 2202 11/03/24 0339 11/03/24 0539 11/03/24 2307 11/04/24 0753 11/06/24 0328  WBC  --   --  4.6 3.7* 5.6 6.1 7.6  LATICACIDVEN 4.4* 2.8* 4.6* 4.7*  --   --   --     Liver Function Tests: Recent Labs  Lab 11/02/24 2013 11/03/24 0339 11/05/24 0759  AST 63* 126* 53*  ALT 13 22 27   ALKPHOS 105 85 105  BILITOT 0.6 0.7 1.3*  PROT 7.9 6.1* 5.9*  ALBUMIN  4.0 3.1* 2.8*   No results for input(s): LIPASE, AMYLASE in the last 168 hours. No results for input(s): AMMONIA in the last 168 hours.  ABG    Component Value Date/Time   PHART 7.3 (L) 11/03/2024 0439   PCO2ART 37 11/03/2024 0439   PO2ART 146 (H) 11/03/2024 0439   HCO3 18.3 (L) 11/03/2024 0439   TCO2 19 (L) 11/02/2024 2016   ACIDBASEDEF 7.7 (H) 11/03/2024 0439   O2SAT 100 11/03/2024 0439     Coagulation Profile: Recent Labs  Lab 11/02/24 2153  INR 1.4*    Cardiac Enzymes: No results for input(s): CKTOTAL, CKMB, CKMBINDEX, TROPONINI in the last 168 hours.  HbA1C: Hgb A1c MFr Bld  Date/Time Value Ref Range Status  11/03/2024 03:39 AM 5.2 4.8 - 5.6 % Final    Comment:    (NOTE) Diagnosis of Diabetes The following HbA1c ranges recommended by the American Diabetes Association (ADA) may be used as an aid in the diagnosis of diabetes mellitus.  Hemoglobin             Suggested A1C NGSP%              Diagnosis  <5.7                   Non Diabetic  5.7-6.4                Pre-Diabetic  >6.4                   Diabetic  <7.0                   Glycemic control for                       adults with diabetes.    04/22/2014 11:53 AM 5.3 4.6 - 6.5 % Final    Comment:    Glycemic Control Guidelines for People with Diabetes:Non Diabetic:  <6%Goal of Therapy: <7%Additional Action Suggested:  >8%     CBG: Recent Labs  Lab 11/05/24 1609 11/05/24 2001 11/05/24 2320  11/06/24 0339 11/06/24 0734  GLUCAP 110* 116* 136* 82 79       Critical care time: n/a    Dorn Chill, MD [facility] Office: [phone]   See Amion for personal pager PCCM on call pager [phone] until 7pm. Please call Elink 7p-7a. [phone]          "

## 2024-11-06 NOTE — Progress Notes (Signed)
" °   11/06/24 1500  Spiritual Encounters  Type of Visit Initial  Care provided to: Patient  Referral source Chaplain assessment  Reason for visit Routine spiritual support  OnCall Visit No   Based on patient's request for support late yesterday afternoon, I wanted to offer follow up.  I provided spiritual care support for Mrs [name]. [name] welcomed my visit. She shared that she is a Christian and that her faith is a source of meaning and encouragement. She described recent changes in health, currently she described feeling hot and need for oxygen. She did not go into detail about concerns or goals of care decisions but seemed comforted just to have her faith affirmed and to have compassionate presence. I offered prayer with her consent. She asked for ongoing chaplain visits, so will refer for support 12/31.  Dennys Traughber L. Delores HERO.Div "

## 2024-11-07 DIAGNOSIS — K631 Perforation of intestine (nontraumatic): Secondary | ICD-10-CM | POA: Diagnosis not present

## 2024-11-07 DIAGNOSIS — A419 Sepsis, unspecified organism: Secondary | ICD-10-CM | POA: Diagnosis not present

## 2024-11-07 DIAGNOSIS — R451 Restlessness and agitation: Secondary | ICD-10-CM

## 2024-11-07 DIAGNOSIS — I129 Hypertensive chronic kidney disease with stage 1 through stage 4 chronic kidney disease, or unspecified chronic kidney disease: Secondary | ICD-10-CM | POA: Diagnosis not present

## 2024-11-07 DIAGNOSIS — J9601 Acute respiratory failure with hypoxia: Secondary | ICD-10-CM | POA: Diagnosis not present

## 2024-11-07 LAB — CBC WITH DIFFERENTIAL/PLATELET
Abs Immature Granulocytes: 0.15 K/uL — ABNORMAL HIGH (ref 0.00–0.07)
Basophils Absolute: 0.1 K/uL (ref 0.0–0.1)
Basophils Relative: 1 %
Eosinophils Absolute: 0 K/uL (ref 0.0–0.5)
Eosinophils Relative: 0 %
HCT: 32 % — ABNORMAL LOW (ref 36.0–46.0)
Hemoglobin: 10.7 g/dL — ABNORMAL LOW (ref 12.0–15.0)
Immature Granulocytes: 2 %
Lymphocytes Relative: 7 %
Lymphs Abs: 0.5 K/uL — ABNORMAL LOW (ref 0.7–4.0)
MCH: 33.9 pg (ref 26.0–34.0)
MCHC: 33.4 g/dL (ref 30.0–36.0)
MCV: 101.3 fL — ABNORMAL HIGH (ref 80.0–100.0)
Monocytes Absolute: 0.6 K/uL (ref 0.1–1.0)
Monocytes Relative: 8 %
Neutro Abs: 6 K/uL (ref 1.7–7.7)
Neutrophils Relative %: 82 %
Platelets: 23 K/uL — CL (ref 150–400)
RBC: 3.16 MIL/uL — ABNORMAL LOW (ref 3.87–5.11)
RDW: 15.7 % — ABNORMAL HIGH (ref 11.5–15.5)
Smear Review: NORMAL
WBC: 7.3 K/uL (ref 4.0–10.5)
nRBC: 0.4 % — ABNORMAL HIGH (ref 0.0–0.2)

## 2024-11-07 LAB — GLUCOSE, CAPILLARY
Glucose-Capillary: 117 mg/dL — ABNORMAL HIGH (ref 70–99)
Glucose-Capillary: 83 mg/dL (ref 70–99)
Glucose-Capillary: 78 mg/dL (ref 70–99)
Glucose-Capillary: 132 mg/dL — ABNORMAL HIGH (ref 70–99)
Glucose-Capillary: 118 mg/dL — ABNORMAL HIGH (ref 70–99)
Glucose-Capillary: 110 mg/dL — ABNORMAL HIGH (ref 70–99)

## 2024-11-07 LAB — TRIGLYCERIDES: Triglycerides: 105 mg/dL

## 2024-11-07 MED ORDER — SODIUM CHLORIDE 0.9 % IV SOLN
2.0000 g | INTRAVENOUS | Status: DC
  Administered 2024-11-07: 2 g via INTRAVENOUS
  Filled 2024-11-07: qty 20

## 2024-11-07 MED ORDER — METRONIDAZOLE 500 MG/100ML IV SOLN
500.0000 mg | Freq: Two times a day (BID) | INTRAVENOUS | Status: DC
  Administered 2024-11-07 (×2): 500 mg via INTRAVENOUS
  Filled 2024-11-07 (×2): qty 100

## 2024-11-07 NOTE — TOC Progression Note (Signed)
 Transition of Care [facility]) - Progression Note    Patient Details  Name: [name] MRN: [id number] Date of Birth: 1946/01/30  Transition of [facility]) CM/SW Contact  Jon ONEIDA Anon, RN Phone Number: 11/07/2024, 3:00 PM  Clinical Narrative:    RNCM met with pt at bedside to discuss PT recommendation for STR at SNF at discharge. Pt agreeable to recommendation and asked if RNCM could speak with pt spouse about recommendation as well. Pt in spouse in agreement stating they would prefer pt go to [facility] in [facility] because she went for STR at this facility in the past. [facility] faxed out referrals for SNF placement, awaiting bed offers. Once bed offers become available will present to pt/ pt spouse. ICM continuing to follow for DC planning needs.     Expected Discharge Plan: Skilled Nursing Facility Barriers to Discharge: Continued Medical Work up               Expected Discharge Plan and Services In-house Referral: NA Discharge Planning Services: CM Consult Post Acute Care Choice: Durable Medical Equipment, Home Health Living arrangements for the past 2 months: Single Family Home                                       Social Drivers of Health (SDOH) Interventions SDOH Screenings   Food Insecurity: Patient Unable To Answer (11/03/2024)  Housing: Unknown (11/03/2024)  Transportation Needs: Patient Unable To Answer (11/03/2024)  Utilities: Patient Unable To Answer (11/03/2024)  Alcohol Screen: Low Risk (09/27/2022)  Depression (PHQ2-9): Low Risk (09/25/2024)  Recent Concern: Depression (PHQ2-9) - High Risk (08/07/2024)  Financial Resource Strain: Low Risk (03/22/2024)   Received from [facility]  Physical Activity: Insufficiently Active (09/25/2024)  Social Connections: Patient Unable To Answer (11/03/2024)  Recent Concern: Social Connections - Moderately Isolated (09/25/2024)  Stress: No Stress Concern Present (09/25/2024)  Tobacco Use: Low Risk  (11/02/2024)  Health Literacy: Adequate Health Literacy (09/25/2024)    Readmission Risk Interventions    11/06/2024    1:04 PM  Readmission Risk Prevention Plan  Transportation Screening Complete  PCP or Specialist Appt within 3-5 Days Complete  HRI or Home Care Consult Complete  Social Work Consult for Recovery Care Planning/Counseling Complete  Palliative Care Screening Not Applicable  Medication Review Oceanographer) Complete

## 2024-11-07 NOTE — NC FL2 (Signed)
 " [place]  MEDICAID FL2 LEVEL OF CARE FORM     IDENTIFICATION  Patient Name: [name] Birthdate: February 14, 1946 Sex: female Admission Date (Current Location): 11/02/2024  [facility] and [place] Number:  Producer, Television/film/video and Address:  [facility],  [address], [place] [address]      Provider Number: [id number]  Attending Physician Name and Address:  Ebbie Cough, MD   Relative Name and Phone Number:  Jacqueline, [place], Emergency Contact  [phone]    Current Level of Care: Hospital Recommended Level of Care: Skilled Nursing Facility Prior Approval Number:    Date Approved/Denied:   PASRR Number: [id number]  Discharge Plan: SNF    Current Diagnoses: Patient Active Problem List   Diagnosis Date Noted   Bowel perforation (HCC) 11/03/2024   Small bowel perforation (HCC) 11/03/2024   Perforated gastric ulcer (HCC) 11/03/2024   Tachycardia 10/15/2024   Tremor 09/03/2024   Anxiety 09/03/2024   Opioid dependence with withdrawal (HCC) 05/20/2023   Deep vein thrombosis (DVT) of proximal vein of both lower extremities, unspecified chronicity (HCC) 05/20/2023   Elevated BP without diagnosis of hypertension 11/12/2021   Dysuria 10/16/2019   Suspected COVID-19 virus infection 10/16/2019   Closed nondisplaced intertrochanteric fracture of right femur (HCC) 05/31/2019   Closed displaced comminuted supracondylar fracture of left humerus without intercondylar fracture 04/17/2019   Nonfunctioning kidney 12/25/2018   Hydronephrosis with ureteral stricture, not elsewhere classified 12/21/2018   Malnutrition of moderate degree 12/08/2018   Delirium 12/07/2018   Hydronephrosis of left kidney 12/07/2018   Pyelonephritis 12/07/2018   Protein-calorie malnutrition, severe 08/24/2018   Pressure injury of skin 08/22/2018   Obstruction of left ureteropelvic junction (UPJ) due to stone 08/21/2018   Hypoalbuminemia 08/21/2018   Chronic pain disorder 08/21/2018    Thrombocytopenia 08/21/2018   Acute renal failure (ARF) 08/21/2018   Compression fracture of spine (HCC) 08/21/2018   Acute respiratory failure with hypoxemia (HCC)    Hematoma 06/03/2018   Dysphagia 02/07/2018   Generalized anxiety disorder 07/04/2017   Nonintractable headache 07/04/2017   Exertional dyspnea 08/05/2016   Sepsis (HCC) 03/07/2015   Hypokalemia 03/07/2015   Acute encephalopathy 03/06/2015   Fever 03/06/2015   Chronic back pain 01/15/2015   Obesity (BMI 30-39.9) 10/15/2013   Pulmonary nodules 10/15/2013   CAP (community acquired pneumonia) 10/02/2013   Primary hypertension 08/24/2013   Idiopathic scoliosis 04/19/2013   Back pain 04/19/2013   Osteoporosis 04/19/2013   Edema 04/19/2013   Mild diastolic dysfunction 01/01/2013   Leg pain, bilateral 12/16/2012   Attention deficit disorder (ADD) in adult 05/24/2012   Involuntary movements 05/24/2012   DIZZINESS 12/31/2009   TREMOR, ESSENTIAL 12/12/2009   PALPITATIONS, RECURRENT 12/12/2009   OTHER VITAMIN B12 DEFICIENCY ANEMIA 10/30/2009   FEMALE STRESS INCONTINENCE 10/28/2009   MEMORY LOSS 10/28/2009   GERD 12/03/2008   ANEMIA, MILD 06/26/2008   DEPRESSION/ANXIETY 12/28/2007   PICA 12/28/2007   ACUTE BRONCHITIS 12/28/2007   FATIGUE 12/28/2007   Hyperlipidemia 05/15/2007   ARTIFICIAL MENOPAUSE 05/15/2007   Myalgia and myositis, unspecified 05/15/2007   Headache 05/15/2007   CHEST PAIN, ATYPICAL 05/15/2007   SYMPTOM, PAIN, ABDOMINAL, EPIGASTRIC 05/15/2007   Personal history presenting hazards to health 05/15/2007   PSTPRC STATUS, BARIATRIC SURGERY 05/15/2007    Orientation RESPIRATION BLADDER Height & Weight     Self, Situation, Place  Normal Indwelling catheter Weight: 68.4 kg Height:  5' 5 (165.1 cm)  BEHAVIORAL SYMPTOMS/MOOD NEUROLOGICAL BOWEL NUTRITION STATUS      Continent Diet  AMBULATORY STATUS COMMUNICATION OF NEEDS Skin   Extensive Assist Verbally Surgical wounds, Other (Comment), PU Stage  and Appropriate Care (Surgical Incision Abdomen, Pressure Injury Sacrum Right;Posterior Stage 3, JP Drain 19Fr, Rt Abdomen)                       Personal Care Assistance Level of Assistance  Bathing, Dressing, Feeding Bathing Assistance: Limited assistance Feeding assistance: Limited assistance Dressing Assistance: Limited assistance     Functional Limitations Info  Sight, Hearing, Speech Sight Info: Adequate Hearing Info: Adequate Speech Info: Adequate    SPECIAL CARE FACTORS FREQUENCY  PT (By licensed PT), OT (By licensed OT)     PT Frequency: 5x/wk OT Frequency: 5x/wk            Contractures Contractures Info: Not present    Additional Factors Info  Allergies, Code Status Code Status Info: FULL Allergies Info: Zanaflex  (Tizanidine ), Adhesive (Tape), Latex           Current Medications (11/07/2024):  This is the current hospital active medication list Current Facility-Administered Medications  Medication Dose Route Frequency Provider Last Rate Last Admin   cefTRIAXone  (ROCEPHIN ) 2 g in sodium chloride  0.9 % 100 mL IVPB  2 g Intravenous Q24H Kara Dorn NOVAK, MD   Stopped at 11/07/24 1035   Chlorhexidine  Gluconate Cloth 2 % PADS 6 each  6 each Topical Q2200 Kara Dorn NOVAK, MD   6 each at 11/07/24 0559   docusate (COLACE) 50 MG/5ML liquid 100 mg  100 mg Per Tube BID PRN Maree Harder, MD       hydrALAZINE  (APRESOLINE ) injection 10 mg  10 mg Intravenous Q6H PRN Kara Dorn NOVAK, MD   10 mg at 11/07/24 0553   insulin  aspart (novoLOG ) injection 0-9 Units  0-9 Units Subcutaneous Q4H Maree Harder, MD   1 Units at 11/05/24 2325   labetalol  (NORMODYNE ) injection 20 mg  20 mg Intravenous Q4H PRN Kara Dorn NOVAK, MD   20 mg at 11/06/24 1341   metroNIDAZOLE  (FLAGYL ) IVPB 500 mg  500 mg Intravenous Q12H Kara Dorn NOVAK, MD   Stopped at 11/07/24 1050   morphine  (PF) 2 MG/ML injection 1-4 mg  1-4 mg Intravenous Q1H PRN Toth, Paul III, MD   4 mg at 11/07/24 9446    mupirocin  ointment (BACTROBAN ) 2 % 1 Application  1 Application Nasal BID Kara Dorn NOVAK, MD   1 Application at 11/07/24 9047   ondansetron  (ZOFRAN -ODT) disintegrating tablet 4 mg  4 mg Oral Q6H PRN Curvin Mt III, MD       Or   ondansetron  (ZOFRAN ) injection 4 mg  4 mg Intravenous Q6H PRN Curvin Mt III, MD       ondansetron  (ZOFRAN ) injection 4 mg  4 mg Intravenous Q6H PRN Maree Harder, MD       Oral care mouth rinse  15 mL Mouth Rinse PRN Maree Harder, MD       pantoprazole  (PROTONIX ) injection 40 mg  40 mg Intravenous Q12H Thomas, Alicia, MD   40 mg at 11/07/24 0952   polyethylene glycol (MIRALAX  / GLYCOLAX ) packet 17 g  17 g Per Tube Daily PRN Gleason, Laura R, PA-C         Discharge Medications: Please see discharge summary for a list of discharge medications.  Relevant Imaging Results:  Relevant Lab Results:   Additional Information SSN: [id number]  Jon ONEIDA Anon, RN     "

## 2024-11-07 NOTE — Progress Notes (Signed)
 "  NAME:  [name], MRN:  [id number], DOB:  1946-07-20, LOS: 4 ADMISSION DATE:  11/02/2024, CONSULTATION DATE:  11/03/2024 REFERRING MD:  Deward Null III, MD, CHIEF COMPLAINT:  Abdominal pain   History of Present Illness:  78 y/o female with PMH for HTN, Tremor, CKD, Malnutrition, Depression, One Kidney, Spinal cord stimulator, forward flexed neck who presented with abd pain.  Appararently agitated and altered. CT abd showing large amount of free air consistent wiuth bowel perforation.  She was emergently taken to the OR.  She underwent an ex-lap and found to have perforation at the gastrojejunostomy.  She had an omental patch repair.  Pertinent  Medical History   HTN, Tremor, CKD, Malnutrition, Depression, One Kidney, Spinal cord stimulator, forward flexed neck  Significant Hospital Events: Including procedures, antibiotic start and stop dates in addition to other pertinent events   12/27: admit to ICU post op, extubated 12/29: remains in stepdown, NG to low intermittent suction, on precedex  for agitation 12/30: NG tube removed  Interim History / Subjective:   No issues overnight  Patient feeling better today Discussed with surgery, ok for transfer out of ICU   Objective    Blood pressure (!) 165/87, pulse 91, temperature 98.1 F (36.7 C), temperature source Oral, resp. rate (!) 29, height 5' 5 (1.651 m), weight 68.4 kg, SpO2 93%.        Intake/Output Summary (Last 24 hours) at 11/07/2024 0733 Last data filed at 11/07/2024 0546 Gross per 24 hour  Intake 203.32 ml  Output 1135 ml  Net -931.68 ml   Filed Weights   11/05/24 0500 11/06/24 0500 11/07/24 0500  Weight: 68.4 kg 66.2 kg 68.4 kg    Examination: General: no acute distress, elderly woman, laying in bed HENT: [place]/AT, moist mucous membranes Lungs: clear to auscultation,, no wheezes, no rales Cardiovascular: rrr Abdomen: soft, JP drain - serosanguinous  Extremities:  trace lower extremity edema Neuro: PERRL,  following commands   Resolved problem list  AKI on CKD AGMA Lactic Acidosis  Assessment and Plan  Acute hypoxic respiratory failure - extubated 12/27, on room air now - goal SpO2 92% or higher  Small bowel perforation  - S/p ex-lap and omental patch  - Abd closed with JP drain - PRN dilaudid  for pain - management per surgery - NG to low intermittent suction - No concerns based on upper GI eval yesterday, surgery to decide on diet  Sepsis due to acute abdomen - plan for zosyn  for 5 day course post-op, but having thrombocytopenia. Stopping early today - ceftriaxone  + flagyl  for 2 days  - no growth on cultures  Agitation - has been off precedex  for over 24 hours now - PRN haldol  HTN - Prn hydralazine  and labetalol  - will resume home antihypertensives when able to access GI tract  Malnutrition - diet when approved by surgery  Mild Anemia Thrombocytopenia - trend - thrombocytopenia progressing, possibly due to zosyn  and sepsis. HIT less likely, 3 points on 4T scoring. Stopping zosyn  today  Chronic Pain on chronic narcotics - consider adding fentanyl  patch until able to access the GI tract  Labs   CBC: Recent Labs  Lab 11/02/24 2013 11/02/24 2016 11/03/24 0339 11/03/24 0539 11/03/24 2307 11/04/24 0753 11/06/24 0328 11/07/24 0326  WBC 8.5  --  4.6 3.7* 5.6 6.1 7.6 7.3  NEUTROABS 7.1  --  3.9  --   --  4.1 7.0 6.0  HGB 13.8   < > 11.5* 10.1* 11.3* 11.9* 11.2*  10.7*  HCT 44.7   < > 37.5 33.3* 34.1* 35.0* 33.2* 32.0*  MCV 110.4*  --  110.3* 112.9* 103.0* 100.6* 100.3* 101.3*  PLT 241  --  142* 151 128* 94* 30* 23*   < > = values in this interval not displayed.    Basic Metabolic Panel: Recent Labs  Lab 11/03/24 0339 11/03/24 0539 11/03/24 1456 11/04/24 0753 11/05/24 0759 11/06/24 0328 11/06/24 0809  NA 142 140 139 141 142  --  141  K 4.4 4.1 4.6 3.8 3.5  --  3.3*  CL 109 108 103 104 106  --  105  CO2 15* 15* 21* 24 22  --  23  GLUCOSE 39* 70 104*  99 102*  --  89  BUN 37* 35* 31* 27* 23  --  22  CREATININE 2.17* 2.32* 1.86* 1.44* 1.16*  --  1.10*  CALCIUM 8.5* 8.2* 8.6* 8.8* 8.7*  --  8.3*  MG 2.0 2.1  --  2.0 2.1  --  1.9  PHOS 4.7* 4.1  --   --  1.8* 2.5  --    GFR: Estimated Creatinine Clearance: 42.3 mL/min (A) (by C-G formula based on SCr of 1.1 mg/dL (H)). Recent Labs  Lab 11/02/24 2015 11/02/24 2202 11/03/24 0339 11/03/24 0539 11/03/24 2307 11/04/24 0753 11/06/24 0328 11/07/24 0326  WBC  --   --  4.6 3.7* 5.6 6.1 7.6 7.3  LATICACIDVEN 4.4* 2.8* 4.6* 4.7*  --   --   --   --     Liver Function Tests: Recent Labs  Lab 11/02/24 2013 11/03/24 0339 11/05/24 0759 11/06/24 0809  AST 63* 126* 53* 24  ALT 13 22 27 16   ALKPHOS 105 85 105 94  BILITOT 0.6 0.7 1.3* 1.3*  PROT 7.9 6.1* 5.9* 5.4*  ALBUMIN  4.0 3.1* 2.8* 2.6*   No results for input(s): LIPASE, AMYLASE in the last 168 hours. No results for input(s): AMMONIA in the last 168 hours.  ABG    Component Value Date/Time   PHART 7.3 (L) 11/03/2024 0439   PCO2ART 37 11/03/2024 0439   PO2ART 146 (H) 11/03/2024 0439   HCO3 18.3 (L) 11/03/2024 0439   TCO2 19 (L) 11/02/2024 2016   ACIDBASEDEF 7.7 (H) 11/03/2024 0439   O2SAT 100 11/03/2024 0439     Coagulation Profile: Recent Labs  Lab 11/02/24 2153  INR 1.4*    Cardiac Enzymes: No results for input(s): CKTOTAL, CKMB, CKMBINDEX, TROPONINI in the last 168 hours.  HbA1C: Hgb A1c MFr Bld  Date/Time Value Ref Range Status  11/03/2024 03:39 AM 5.2 4.8 - 5.6 % Final    Comment:    (NOTE) Diagnosis of Diabetes The following HbA1c ranges recommended by the American Diabetes Association (ADA) may be used as an aid in the diagnosis of diabetes mellitus.  Hemoglobin             Suggested A1C NGSP%              Diagnosis  <5.7                   Non Diabetic  5.7-6.4                Pre-Diabetic  >6.4                   Diabetic  <7.0                   Glycemic control for  adults with diabetes.    04/22/2014 11:53 AM 5.3 4.6 - 6.5 % Final    Comment:    Glycemic Control Guidelines for People with Diabetes:Non Diabetic:  <6%Goal of Therapy: <7%Additional Action Suggested:  >8%     CBG: Recent Labs  Lab 11/06/24 1124 11/06/24 1539 11/06/24 2021 11/06/24 2330 11/07/24 0355  GLUCAP 77 108* 86 101* 117*      Critical care time: n/a    Dorn Chill, MD [facility] Office: [phone]   See Amion for personal pager PCCM on call pager [phone] until 7pm. Please call Elink 7p-7a. [phone]          "

## 2024-11-07 NOTE — Progress Notes (Signed)
 5 Days Post-Op   Subjective/Chief Complaint: Ugi fine, doing well this am, having bowel function   Objective: Vital signs in last 24 hours: Temp:  [98.1 F (36.7 C)-99.2 F (37.3 C)] 98.1 F (36.7 C) (12/31 0357) Pulse Rate:  [83-105] 91 (12/31 0600) Resp:  [19-38] 29 (12/31 0600) BP: (139-186)/(76-108) 165/87 (12/31 0600) SpO2:  [91 %-97 %] 93 % (12/31 0600) Weight:  [68.4 kg] 68.4 kg (12/31 0500)    Intake/Output from previous day: 12/30 0701 - 12/31 0700 In: 203.3 [I.V.:55.6; IV Piggyback:147.8] Out: 1135 [Urine:1075; Drains:60] Intake/Output this shift: No intake/output data recorded.  General nad Cv regular Pulm effort normal Ab wound clean, approp tender, jp ss  Lab Results:  Recent Labs    11/06/24 0328 11/07/24 0326  WBC 7.6 7.3  HGB 11.2* 10.7*  HCT 33.2* 32.0*  PLT 30* 23*   BMET Recent Labs    11/05/24 0759 11/06/24 0809  NA 142 141  K 3.5 3.3*  CL 106 105  CO2 22 23  GLUCOSE 102* 89  BUN 23 22  CREATININE 1.16* 1.10*  CALCIUM 8.7* 8.3*   PT/INR No results for input(s): LABPROT, INR in the last 72 hours. ABG No results for input(s): PHART, HCO3 in the last 72 hours.  Invalid input(s): PCO2, PO2  Studies/Results: DG UGI W SINGLE CM (SOL OR THIN BA) Result Date: 11/06/2024 CLINICAL DATA:  Perforated gastric ulcer. EXAM: WATER  SOLUBLE UPPER GI SERIES TECHNIQUE: Single-column upper GI series was performed using water  soluble contrast. Radiation Exposure Index (as provided by the fluoroscopic device): 21.0 mGy Kerma CONTRAST:  50 mL Omnipaque  COMPARISON:  Four days ago FLUOROSCOPY: Radiation Exposure Index (if provided by the fluoroscopic device): 21.0 mGy FINDINGS: Contrast was injected through pre-existing nasogastric tube. Status post gastrojejunostomy. Contrast flowed easily from the stomach into jejunum and small bowel. No contrast extravasation or leakage is noted. Visualized small bowel loops are unremarkable. IMPRESSION: No  contrast extravasation or leakage is noted involving gastrojejunostomy. Electronically Signed   By: Lynwood Landy Raddle M.D.   On: 11/06/2024 09:47    Anti-infectives: Anti-infectives (From admission, onward)    Start     Dose/Rate Route Frequency Ordered Stop   11/05/24 0800  vancomycin  (VANCOREADY) IVPB 750 mg/150 mL  Status:  Discontinued        750 mg 150 mL/hr over 60 Minutes Intravenous Every 48 hours 11/03/24 0725 11/03/24 0751   11/04/24 2200  vancomycin  (VANCOREADY) IVPB 750 mg/150 mL  Status:  Discontinued        750 mg 150 mL/hr over 60 Minutes Intravenous Every 48 hours 11/02/24 2154 11/03/24 0725   11/03/24 2200  piperacillin -tazobactam (ZOSYN ) IVPB 3.375 g        3.375 g 12.5 mL/hr over 240 Minutes Intravenous Every 8 hours 11/03/24 1903 11/08/24 2159   11/03/24 0845  linezolid  (ZYVOX ) IVPB 600 mg  Status:  Discontinued        600 mg 300 mL/hr over 60 Minutes Intravenous Every 12 hours 11/03/24 0751 11/06/24 0820   11/03/24 0815  vancomycin  (VANCOCIN ) IVPB 1000 mg/200 mL premix  Status:  Discontinued        1,000 mg 200 mL/hr over 60 Minutes Intravenous  Once 11/03/24 0725 11/03/24 0751   11/03/24 0415  metroNIDAZOLE  (FLAGYL ) IVPB 500 mg  Status:  Discontinued        500 mg 100 mL/hr over 60 Minutes Intravenous Every 12 hours 11/03/24 0315 11/03/24 0751   11/03/24 0415  piperacillin -tazobactam (ZOSYN ) IVPB 2.25 g  Status:  Discontinued        2.25 g 100 mL/hr over 30 Minutes Intravenous Every 8 hours 11/03/24 0319 11/03/24 1903   11/02/24 2130  vancomycin  (VANCOCIN ) IVPB 1000 mg/200 mL premix  Status:  Discontinued        1,000 mg 200 mL/hr over 60 Minutes Intravenous  Once 11/02/24 2122 11/03/24 0725   11/02/24 2030  ceFEPIme  (MAXIPIME ) 2 g in sodium chloride  0.9 % 100 mL IVPB        2 g 200 mL/hr over 30 Minutes Intravenous  Once 11/02/24 2018 11/02/24 2125   11/02/24 2030  metroNIDAZOLE  (FLAGYL ) IVPB 500 mg        500 mg 100 mL/hr over 60 Minutes Intravenous  Once  11/02/24 2018 11/03/24 2000       Assessment/Plan: POD 4 omental patch of perforation of GJ (prior bypass)- PT -clears today after UGI -lovenox  for dvt prophylaxis -l5 days postop abx -protonix  bid -appreciate ccm care, transferring to floor today on tele -PT, OT written for -dispo planning, toc written for   [name] 11/07/2024

## 2024-11-08 DIAGNOSIS — K631 Perforation of intestine (nontraumatic): Secondary | ICD-10-CM | POA: Diagnosis not present

## 2024-11-08 LAB — CBC
HCT: 33.4 % — ABNORMAL LOW (ref 36.0–46.0)
Hemoglobin: 11.2 g/dL — ABNORMAL LOW (ref 12.0–15.0)
MCH: 33.7 pg (ref 26.0–34.0)
MCHC: 33.5 g/dL (ref 30.0–36.0)
MCV: 100.6 fL — ABNORMAL HIGH (ref 80.0–100.0)
Platelets: 31 K/uL — ABNORMAL LOW (ref 150–400)
RBC: 3.32 MIL/uL — ABNORMAL LOW (ref 3.87–5.11)
RDW: 15.8 % — ABNORMAL HIGH (ref 11.5–15.5)
WBC: 8.6 K/uL (ref 4.0–10.5)
nRBC: 0.5 % — ABNORMAL HIGH (ref 0.0–0.2)

## 2024-11-08 LAB — BASIC METABOLIC PANEL WITH GFR
Anion gap: 12 (ref 5–15)
BUN: 16 mg/dL (ref 8–23)
CO2: 25 mmol/L (ref 22–32)
Calcium: 8.6 mg/dL — ABNORMAL LOW (ref 8.9–10.3)
Chloride: 105 mmol/L (ref 98–111)
Creatinine, Ser: 0.86 mg/dL (ref 0.44–1.00)
GFR, Estimated: >60 mL/min
Glucose, Bld: 124 mg/dL — ABNORMAL HIGH (ref 70–99)
Potassium: 3.1 mmol/L — ABNORMAL LOW (ref 3.5–5.1)
Sodium: 142 mmol/L (ref 135–145)

## 2024-11-08 LAB — GLUCOSE, CAPILLARY
Glucose-Capillary: 113 mg/dL — ABNORMAL HIGH (ref 70–99)
Glucose-Capillary: 121 mg/dL — ABNORMAL HIGH (ref 70–99)
Glucose-Capillary: 110 mg/dL — ABNORMAL HIGH (ref 70–99)
Glucose-Capillary: 134 mg/dL — ABNORMAL HIGH (ref 70–99)
Glucose-Capillary: 96 mg/dL (ref 70–99)

## 2024-11-08 LAB — CULTURE, BLOOD (ROUTINE X 2): Culture: NO GROWTH

## 2024-11-08 MED ORDER — VENLAFAXINE HCL ER 150 MG PO CP24
150.0000 mg | ORAL_CAPSULE | Freq: Every day | ORAL | Status: DC
  Administered 2024-11-09 – 2024-11-14 (×6): 150 mg via ORAL
  Filled 2024-11-08 (×6): qty 1

## 2024-11-08 MED ORDER — PANTOPRAZOLE SODIUM 40 MG PO TBEC
40.0000 mg | DELAYED_RELEASE_TABLET | Freq: Two times a day (BID) | ORAL | Status: DC
  Administered 2024-11-08 – 2024-11-14 (×13): 40 mg via ORAL
  Filled 2024-11-08 (×13): qty 1

## 2024-11-08 MED ORDER — LOSARTAN POTASSIUM 50 MG PO TABS
50.0000 mg | ORAL_TABLET | Freq: Every day | ORAL | Status: DC
  Administered 2024-11-08 – 2024-11-14 (×7): 50 mg via ORAL
  Filled 2024-11-08 (×8): qty 1

## 2024-11-08 MED ORDER — AMLODIPINE BESYLATE 5 MG PO TABS
5.0000 mg | ORAL_TABLET | Freq: Two times a day (BID) | ORAL | Status: DC
  Administered 2024-11-08 – 2024-11-14 (×13): 5 mg via ORAL
  Filled 2024-11-08 (×13): qty 1

## 2024-11-08 MED ORDER — TIZANIDINE HCL 4 MG PO TABS
4.0000 mg | ORAL_TABLET | Freq: Every day | ORAL | Status: DC
  Administered 2024-11-08 – 2024-11-13 (×6): 4 mg via ORAL
  Filled 2024-11-08 (×6): qty 1

## 2024-11-08 MED ORDER — POTASSIUM CHLORIDE 20 MEQ PO PACK
40.0000 meq | PACK | Freq: Once | ORAL | Status: AC
  Administered 2024-11-08: 40 meq via ORAL
  Filled 2024-11-08: qty 2

## 2024-11-08 MED ORDER — CLONAZEPAM 0.5 MG PO TABS
0.5000 mg | ORAL_TABLET | Freq: Two times a day (BID) | ORAL | Status: DC | PRN
  Administered 2024-11-08 – 2024-11-13 (×5): 0.5 mg via ORAL
  Filled 2024-11-08 (×5): qty 1

## 2024-11-08 NOTE — Assessment & Plan Note (Addendum)
 Thrombocytopenia thought to be due to zosyn  and sepsis. HIT less likely, 3 points on 4T scoring No longer on Zosyn  Platelets up today from 23 to 31 Will continue to follow daily Hgb stable, 11.2 today

## 2024-11-08 NOTE — Consult Note (Signed)
 Initial Consultation Note   Patient: [name] FMW:[id number] DOB: 06-22-1946 PCP: [name] DOA: 11/02/2024 DOS: the patient was seen and examined on 11/08/2024 Primary service: Kristopher, Md, MD  Referring physician: Surgery Reason for consult: Medical management   Assessment and Plan:  Assessment & Plan Small bowel perforation (HCC) Perforated gastric ulcer (HCC) S/p ex-lap and omental patch on 12/26 Abdomen closed with R JP drain PRN dilaudid  for pain Management per surgery NG tube removed CLD -> fulls today Acute respiratory failure with hypoxemia (HCC) Extubated 12/27, on room air now Sepsis (HCC) No growth on cultures Thought to be due to intraabdominal infection Completed antibiotics x 5 days Delirium due to general medical condition Generalized anxiety disorder No longer requiring Precedex  PRN haldol ordered but not needed Resumed Klonopin  Appears to no longer be taking Venlafaxine  Primary hypertension Prn hydralazine  and labetalol  Resumed amlodipine , losartan  Mild diastolic dysfunction 2017 echo with preserved EF and grade 1 diastolic dysfunction EF also preserved on MPS in 12/2018 Appears compensated Hyperlipidemia She does not appear to be taking medications for this issue at this time  Chronic pain disorder I have reviewed this patient in the [place] Controlled Substances Reporting System.  She is receiving medications from only one provider and appears to be taking them as prescribed. She is not at particularly high risk of opioid misuse, diversion, or overdose.  She appears to be taking oxycodone  and Clonazepam  consistently, Tramadol  less frequently Currently not prescribed opiates, complaining of some abdominal pain today but overall seems to be doing well Thrombocytopenia ANEMIA, MILD Thrombocytopenia thought to be due to zosyn  and sepsis. HIT less likely, 3 points on 4T scoring No longer on Zosyn  Platelets up today from 23 to 31 Will  continue to follow daily Hgb stable, 11.2 today        TR[facility] will continue to follow the patient.  HPI: [name] is a 79 y.o. female with past medical history of HTN, tremor, solitary kidney, chronic pain with spinal cord stimulator and anxiety d/o who presented on 12/26 with abdominal pain.  CT with large free air c/w bowel perforation. She was taken emergently for ex lap and was found to have a perforation at the gastrojejunal junction; omental patch repair performed.    Review of Systems: As mentioned in the history of present illness. All other systems reviewed and are negative. Past Medical History:  Diagnosis Date   Abrasion of right heel    during admission 10/ 2019 right heel open skin due to movement on sheet   ADD (attention deficit disorder)    Anemia, mild    Anxiety    Arthritis    joints, back   At high risk for falls    10-19-2018 pt has fell twice in past week, tripped   Chronic back pain    all over my back (10/02/2013)   Crushing injury of arm, right 02/06/1989's   it was crushed; wore cast from fingers to top of my shoulder (11/25/2   Crushing injury of left wrist 05/11/1989's   DOE (dyspnea on exertion)    Essential tremor    Fibromyalgia    History of palpitations 2002   recurrent   History of panic attacks    History of pneumonia 08/21/2018   w/ acute respiratory failure/ hypoxia   History of sepsis    admission 08-21-2018  secondary to uropathy obstructive from ureteral stone    Hyperlipidemia    Hypertension    10-19-2018 PER  PT AMLODIPINE  ON HOLD SINCE 10/ 2019 DUE TO KIDNEY ISSUES PER DOCTOR   Idiopathic scoliosis 04/19/2013   Left ureteral stone    Migraines    once/month (10/02/2013)   Osteoporosis    Other vitamin B12 deficiency anemia    PONV (postoperative nausea and vomiting)    As a child   Pulmonary nodules/lesions, multiple    Renal insufficiency    S/P gastric bypass 09/16/2003   S/P insertion of spinal cord stimulator     per pt remote is missing   Wears dentures    upper   Wears glasses    Wears glasses    Past Surgical History:  Procedure Laterality Date   ABDOMINAL HYSTERECTOMY  1980's   W/  BSO  AND APPENDECTOMY   CARDIAC CATHETERIZATION  12-13-2002    dr delford   normal coronaries   CYSTOSCOPY WITH URETEROSCOPY AND STENT PLACEMENT Left 09/27/2018   Procedure: LEFT  URETEROSCOPY, HOLMIUM LASER LITHOTRIPSY;  Surgeon: Cam Morene ORN, MD;  Location: [facility];  Service: Urology;  Laterality: Left;   D & C HYSTERSCOPY RESECTION MYOMECTOMY  1980s   HUMERUS FRACTURE SURGERY Left 04/17/2019    Comminuted complex left intra-articular distal humerus fracture/supracondylar humerus fracture displaced   INTRAMEDULLARY (IM) NAIL INTERTROCHANTERIC Right 06/01/2019   Procedure: INTRAMEDULLARY (IM) NAIL INTERTROCHANTRIC;  Surgeon: Melodi Lerner, MD;  Location: [facility];  Service: Orthopedics;  Laterality: Right;   IR NEPHROSTOMY PLACEMENT LEFT  08/22/2018   IR NEPHROSTOMY PLACEMENT LEFT  12/08/2018   KNEE ARTHROSCOPY Right 03-31-2001    dr amy @[facility]    LAPAROTOMY N/A 11/02/2024   Procedure: LAPAROTOMY, EXPLORATORY AND OMENTAL PATCH REPAIR OF PERFORATION OF GASTROJEJUNOSTOMY;  Surgeon: Curvin Deward MOULD, MD;  Location: [facility];  Service: General;  Laterality: N/A;   NEPHROLITHOTOMY Left 10/20/2018   Procedure: LEFT NEPHROLITHOTOMY PERCUTANEOUS;  Surgeon: Cam Morene ORN, MD;  Location: [facility];  Service: Urology;  Laterality: Left;   ORIF HUMERUS FRACTURE Left 04/17/2019   Procedure: Open reduction internal fixation left supracondylar humerus fracture with olecranon osteotomy and ulnar nerve release and anterior transposition and repair of structures as necessary;  Surgeon: Camella Fallow, MD;  Location: [facility];  Service: Orthopedics;  Laterality: Left;  3 hrs   REDUCTION MAMMAPLASTY Bilateral 2001   ROBOT ASSISTED LAPAROSCOPIC NEPHRECTOMY Left 12/25/2018   Procedure: XI ROBOTIC ASSISTED LAPAROSCOPIC NEPHRECTOMY,  LEFT FLANK EXPLORATION WITH REMOVAL OF BATTERY PACK;  Surgeon: Cam Morene ORN, MD;  Location: [facility];  Service: Urology;  Laterality: Left;   ROUX-EN-Y GASTRIC BYPASS  09-16-2003   dr [email or web address]. [email or web address]  @[facility]    via Laparoscopy w/ gastrojejunostomy   SPINAL CORD STIMULATOR IMPLANT  2016   10-19-2018  PER PT HAS REMOTE BUT HAS NOT USED IT SINCE DEC 2018   TONSILLECTOMY AND ADENOIDECTOMY  1955   TUBAL LIGATION  1980's   Social History:  reports that she has never smoked. She has never used smokeless tobacco. She reports that she does not drink alcohol and does not use drugs.  Allergies[1]  Family History  Adopted: Yes  Problem Relation Age of Onset   COPD Mother    Emphysema Mother    Heart attack Mother    Other Mother        tobacco abuse    Prior to Admission medications  Medication Sig Start Date End Date Taking? Authorizing Provider  amLODipine  (NORVASC ) 5 MG tablet 1 po bid 10/22/24  Yes Lowne Chase, [name], Fischer  clonazePAM  (KLONOPIN ) 0.5 MG tablet  Take 1 tablet (0.5 mg total) by mouth 3 (three) times daily as needed. 10/22/24  Yes Molly Cyndee Rockers R, Fischer  loperamide  (IMODIUM ) 2 MG capsule TAKE 1 CAPSULE BY MOUTH DAILY AS NEEDED FOR DIARRHEA OR LOOSE STOOLS 07/27/24  Yes Molly Cyndee Rockers R, Fischer  losartan  (COZAAR ) 50 MG tablet TAKE 1 TABLET BY MOUTH DAILY 10/16/24  Yes Lowne Chase, [name], Fischer  ondansetron  (ZOFRAN -ODT) 8 MG disintegrating tablet Take 1 tablet (8 mg total) by mouth every 8 (eight) hours as needed for nausea or vomiting. 05/10/24  Yes Molly Cyndee Rockers R, Fischer  oxyCODONE  (ROXICODONE ) 15 MG immediate release tablet Take 1 tablet (15 mg total) by mouth 4 (four) times daily. Patient taking differently: Take 15 mg by mouth See admin instructions. Take 15 mg by mouth every four to six hours 04/19/19  Yes Camella Fallow, MD  SUMAtriptan  (IMITREX ) 50 MG tablet TAKE 1 TABLET BY MOUTH AT ONSET OF MIGRAINE; MAY REPEAT 1 TABLET IN 2 HOURS IF NEEDED. 10/22/24  Yes Molly Cyndee Rockers JONELLE, Fischer  tiZANidine  (ZANAFLEX ) 4 MG tablet Take 4 mg by mouth at bedtime. 07/05/24  Yes [provider]  traMADol  (ULTRAM ) 50 MG tablet Take 50 mg by mouth 2 (two) times daily as needed (for pain).   Yes [provider]  TYLENOL  500 MG tablet Take 500-1,000 mg by mouth every 6 (six) hours as needed for mild pain (pain score 1-3) (or headaches).   Yes [provider]  venlafaxine  XR (EFFEXOR -XR) 75 MG 24 hr capsule Take 2 capsules (150 mg total) by mouth daily. 08/20/24  Yes Molly Cyndee Rockers JONELLE, Fischer    Physical Exam: Vitals:   11/08/24 0800 11/08/24 0900 11/08/24 1000 11/08/24 1134  BP: (!) 161/88 128/72 (!) 149/82 138/82  Pulse: 98 95 94 89  Resp: (!) 29 (!) 33 (!) 27 (!) 24  Temp:    98 F (36.7 C)  TempSrc:    Oral  SpO2: 96% 97% 97% 97%  Weight:      Height:         Subjective: Some abdominal pain.  Tolerating CLD.  NG tube removed.     Intake/Output Summary (Last 24 hours) at 11/08/2024 1446 Last data filed at 11/08/2024 1054 Gross per 24 hour  Intake 30 ml  Output 465 ml  Net -435 ml   Filed Weights   11/06/24 0500 11/07/24 0500  Weight: 66.2 kg 68.4 kg    Exam:  General:  Appears calm and comfortable and is in NAD Eyes:  normal lids, iris ENT:  grossly normal hearing, lips & tongue, mmm Cardiovascular:  RRR. No LE edema.  Respiratory:   CTA bilaterally with no wheezes/rales/rhonchi.  Normal respiratory effort. Abdomen:  large midline bandage is C/D/I; R JP drain in place Skin:  no rash or induration seen on limited exam Musculoskeletal:  grossly normal tone BUE/BLE, good ROM, no bony abnormality Psychiatric:  grossly normal mood and affect, speech fluent and appropriate, AOx3 Neurologic:  CN 2-12 grossly intact, moves all extremities in coordinated fashion  Data Reviewed: I have reviewed the patient's lab results since admission.  Pertinent labs for today include:  K+ 3.1 Glucose 124 WBC 8.6 Hgb 11.2 Platelets 31, improved  from 23     Family Communication: None present Primary team communication: None today Thank you very much for involving us  in the care of your patient.  Author: Delon Herald, MD 11/08/2024 2:46 PM  For on call review [email or web address].      [  1]  Allergies Allergen Reactions   Zanaflex  [Tizanidine ] Other (See Comments)    Hallucinations only with higher doses- otherwise, it is tolerated    Adhesive [Tape] Itching   Latex Itching and Rash

## 2024-11-08 NOTE — Progress Notes (Signed)
 6 Days Post-Op   Subjective/Chief Complaint: Tol clears, not oob, foley out, having bowel function   Objective: Vital signs in last 24 hours: Temp:  [98.2 F (36.8 C)-99.2 F (37.3 C)] 98.6 F (37 C) (01/01 0457) Pulse Rate:  [83-112] 90 (01/01 0600) Resp:  [23-39] 28 (01/01 0600) BP: (135-178)/(74-146) 164/89 (01/01 0600) SpO2:  [86 %-97 %] 96 % (01/01 0600) Last BM Date : 11/07/24  Intake/Output from previous day: 12/31 0701 - 01/01 0700 In: -  Out: 25 [Drains:25] Intake/Output this shift: No intake/output data recorded.  General nad Cv regular Pulm effort normal Ab wound clean, approp tender, jp ss  Lab Results:  Recent Labs    11/07/24 0326 11/08/24 0326  WBC 7.3 8.6  HGB 10.7* 11.2*  HCT 32.0* 33.4*  PLT 23* 31*   BMET Recent Labs    11/06/24 0809 11/08/24 0326  NA 141 142  K 3.3* 3.1*  CL 105 105  CO2 23 25  GLUCOSE 89 124*  BUN 22 16  CREATININE 1.10* 0.86  CALCIUM 8.3* 8.6*   PT/INR No results for input(s): LABPROT, INR in the last 72 hours. ABG No results for input(s): PHART, HCO3 in the last 72 hours.  Invalid input(s): PCO2, PO2  Studies/Results: DG UGI W SINGLE CM (SOL OR THIN BA) Result Date: 11/06/2024 CLINICAL DATA:  Perforated gastric ulcer. EXAM: WATER  SOLUBLE UPPER GI SERIES TECHNIQUE: Single-column upper GI series was performed using water  soluble contrast. Radiation Exposure Index (as provided by the fluoroscopic device): 21.0 mGy Kerma CONTRAST:  50 mL Omnipaque  COMPARISON:  Four days ago FLUOROSCOPY: Radiation Exposure Index (if provided by the fluoroscopic device): 21.0 mGy FINDINGS: Contrast was injected through pre-existing nasogastric tube. Status post gastrojejunostomy. Contrast flowed easily from the stomach into jejunum and small bowel. No contrast extravasation or leakage is noted. Visualized small bowel loops are unremarkable. IMPRESSION: No contrast extravasation or leakage is noted involving  gastrojejunostomy. Electronically Signed   By: Lynwood Landy Raddle M.D.   On: 11/06/2024 09:47    Anti-infectives: Anti-infectives (From admission, onward)    Start     Dose/Rate Route Frequency Ordered Stop   11/07/24 1000  cefTRIAXone  (ROCEPHIN ) 2 g in sodium chloride  0.9 % 100 mL IVPB        2 g 200 mL/hr over 30 Minutes Intravenous Every 24 hours 11/07/24 0808 11/09/24 0959   11/07/24 1000  metroNIDAZOLE  (FLAGYL ) IVPB 500 mg        500 mg 100 mL/hr over 60 Minutes Intravenous Every 12 hours 11/07/24 0808 11/09/24 0959   11/05/24 0800  vancomycin  (VANCOREADY) IVPB 750 mg/150 mL  Status:  Discontinued        750 mg 150 mL/hr over 60 Minutes Intravenous Every 48 hours 11/03/24 0725 11/03/24 0751   11/04/24 2200  vancomycin  (VANCOREADY) IVPB 750 mg/150 mL  Status:  Discontinued        750 mg 150 mL/hr over 60 Minutes Intravenous Every 48 hours 11/02/24 2154 11/03/24 0725   11/03/24 2200  piperacillin -tazobactam (ZOSYN ) IVPB 3.375 g  Status:  Discontinued        3.375 g 12.5 mL/hr over 240 Minutes Intravenous Every 8 hours 11/03/24 1903 11/07/24 0807   11/03/24 0845  linezolid  (ZYVOX ) IVPB 600 mg  Status:  Discontinued        600 mg 300 mL/hr over 60 Minutes Intravenous Every 12 hours 11/03/24 0751 11/06/24 0820   11/03/24 0815  vancomycin  (VANCOCIN ) IVPB 1000 mg/200 mL premix  Status:  Discontinued  1,000 mg 200 mL/hr over 60 Minutes Intravenous  Once 11/03/24 0725 11/03/24 0751   11/03/24 0415  metroNIDAZOLE  (FLAGYL ) IVPB 500 mg  Status:  Discontinued        500 mg 100 mL/hr over 60 Minutes Intravenous Every 12 hours 11/03/24 0315 11/03/24 0751   11/03/24 0415  piperacillin -tazobactam (ZOSYN ) IVPB 2.25 g  Status:  Discontinued        2.25 g 100 mL/hr over 30 Minutes Intravenous Every 8 hours 11/03/24 0319 11/03/24 1903   11/02/24 2130  vancomycin  (VANCOCIN ) IVPB 1000 mg/200 mL premix  Status:  Discontinued        1,000 mg 200 mL/hr over 60 Minutes Intravenous  Once 11/02/24  2122 11/03/24 0725   11/02/24 2030  ceFEPIme  (MAXIPIME ) 2 g in sodium chloride  0.9 % 100 mL IVPB        2 g 200 mL/hr over 30 Minutes Intravenous  Once 11/02/24 2018 11/02/24 2125   11/02/24 2030  metroNIDAZOLE  (FLAGYL ) IVPB 500 mg        500 mg 100 mL/hr over 60 Minutes Intravenous  Once 11/02/24 2018 11/03/24 2000       Assessment/Plan: POD 5 omental patch of perforation of GJ (prior bypass)- PT -will advance to fulls -lovenox  for dvt prophylaxis -l5 days postop abx can stop today -protonix  bid -transferring to floor today on tele -she needs to be oob -PT, OT written for -dispo planning, toc written for  [employer] 11/08/2024

## 2024-11-08 NOTE — Assessment & Plan Note (Addendum)
 No longer requiring Precedex  PRN haldol ordered but not needed Resumed Klonopin  Appears to no longer be taking Venlafaxine 

## 2024-11-08 NOTE — Progress Notes (Signed)
 eLink Physician-Brief Progress Note Patient Name: [name] DOB: May 28, 1946 MRN: [id number]   Date of Service  11/08/2024  HPI/Events of Note  Hypokalemia.  eICU Interventions  Oral replacement ordered.        [name] 11/08/2024, 5:48 AM

## 2024-11-08 NOTE — Assessment & Plan Note (Addendum)
 Prn hydralazine  and labetalol  Resumed amlodipine , losartan 

## 2024-11-08 NOTE — Assessment & Plan Note (Addendum)
 Extubated 12/27, on room air now

## 2024-11-08 NOTE — Assessment & Plan Note (Addendum)
 S/p ex-lap and omental patch on 12/26 Abdomen closed with R JP drain PRN dilaudid  for pain Management per surgery NG tube removed CLD -> fulls today

## 2024-11-08 NOTE — Evaluation (Signed)
 Occupational Therapy Evaluation Patient Details Name: [name] MRN: [id number] DOB: April 30, 1946 Today's Date: 11/08/2024   History of Present Illness   Pt is a 79 yr old female who presented with abdominal pain. CT of abdomen showing large amount of free air consistent wiuth bowel perforation.  She was emergently taken to the OR.  She underwent an exploratory-laparotomy and found to have perforation at the gastrojejunostomy.  She had an omental patch repair. PMH: HTN, tremor, depression, 1 kidney, CKD, severe malnutrition, bypass 2004, spinal cord stimulator 2016, scoliosis     Clinical Impressions The pt is currently presenting well below her baseline level of functioning for self care management. She is limited by the below listed deficits. See OT problem list. As such, her ADL performance is compromised and she is requiring increased assist for the management of ADLs. During the session today, she required max assist to roll in bed, total assist for lower body dressing, set-up assist for face washing and teeth brushing in bed, and min assist for simulated upper body dressing. She was limited by 8/10 abdominal pain. She will benefit from OT services to maximize her independence with ADLs, to decrease the risk for restricted participation in other meaningful activities, and to decrease the risk for further weakness and deconditioning. Patient will benefit from continued inpatient follow up therapy, <3 hours/day.     If plan is discharge home, recommend the following:   A lot of help with walking and/or transfers;A lot of help with bathing/dressing/bathroom;Help with stairs or ramp for entrance;Assistance with cooking/housework;Assist for transportation     Functional Status Assessment   Patient has had a recent decline in their functional status and demonstrates the ability to make significant improvements in function in a reasonable and predictable amount of time.     Equipment  Recommendations   Other (comment) (defer to next setting)     Recommendations for Other Services         Precautions/Restrictions   Precautions Precautions: Fall Precaution/Restrictions Comments: R drain, severe scoliosis Restrictions Weight Bearing Restrictions Per Provider Order: No Other Position/Activity Restrictions: fecal management system     Mobility Bed Mobility Overal bed mobility: Needs Assistance Bed Mobility: Rolling Rolling: Max assist         General bed mobility comments: She required max assist to roll left and right in bed    Transfers        General transfer comment: deferred this date, due to pt reporting 8/10 abdominal pain at rest          ADL either performed or assessed with clinical judgement   ADL Overall ADL's : Needs assistance/impaired Eating/Feeding: Set up;Bed level   Grooming: Set up;Bed level   Upper Body Bathing: Set up;Bed level   Lower Body Bathing: Maximal assistance;Bed level   Upper Body Dressing : Minimal assistance;Bed level   Lower Body Dressing: Total assistance;Bed level       Toileting- Clothing Manipulation and Hygiene: Maximal assistance;Bed level               Vision Baseline Vision/History: 1 Wears glasses Vision Assessment?: Wears glasses for reading;Wears glasses for driving Additional Comments: She correctly read the time depicted on the wall clock.            Pertinent Vitals/Pain Pain Assessment Pain Assessment: 0-10 Pain Score: 8  Pain Location: abdomen Pain Intervention(s): Limited activity within patient's tolerance, Monitored during session, Repositioned     Extremity/Trunk Assessment Upper Extremity Assessment Upper Extremity  Assessment: Right hand dominant;LUE deficits/detail;RUE deficits/detail;Generalized weakness RUE Deficits / Details: AROM WFL. Gross strength 4/5 LUE Deficits / Details: Slight AROM limitation for shoulder flexion. Gross strength 4/5   Lower  Extremity Assessment Lower Extremity Assessment: Generalized weakness;LLE deficits/detail;RLE deficits/detail RLE Deficits / Details: required intermittent AAROM for knee and hip in bed LLE Deficits / Details: required intermittent AAROM for knee and hip in bed       Communication     Cognition Arousal: Alert Behavior During Therapy: WFL for tasks assessed/performed Cognition: No apparent impairments             OT - Cognition Comments: Oriented x4                 Following commands: Intact       Cueing  General Comments   Cueing Techniques: Verbal cues              Home Living Family/patient expects to be discharged to:: Private residence Living Arrangements: Spouse/significant other Available Help at Discharge: Family Type of Home: House (townhouse) Home Access: Stairs to enter Secretary/administrator of Steps: 1   Home Layout: Two level;Able to live on main level with bedroom/bathroom Alternate Level Stairs-Number of Steps: full bathroom and bedroom on main level of home   Bathroom Shower/Tub: Walk-in shower         Home Equipment: Pharmacist, Hospital (2 wheels);Wheelchair - manual          Prior Functioning/Environment Prior Level of Function : Needs assist;Independent/Modified Independent             Mobility Comments: She reported using a RW for household ambulation. ADLs Comments: Modified independent to independent with ADLs, shared household chores with family, and she could drive.    OT Problem List: Decreased strength;Decreased range of motion;Decreased activity tolerance;Impaired balance (sitting and/or standing);Decreased knowledge of precautions;Pain   OT Treatment/Interventions: Self-care/ADL training;Therapeutic exercise;DME and/or AE instruction;Therapeutic activities;Patient/family education;Balance training;Energy conservation      OT Goals(Current goals can be found in the care plan section)   Acute Rehab OT  Goals Patient Stated Goal: to return to prior level of functioning OT Goal Formulation: With patient Time For Goal Achievement: 11/22/24 Potential to Achieve Goals: Good ADL Goals Pt Will Perform Grooming: with set-up;sitting Pt Will Perform Upper Body Dressing: with set-up;sitting Pt Will Transfer to Toilet: with contact guard assist;stand pivot transfer;bedside commode Additional ADL Goal #1: Pt will perform bed mobility with CGA, in prep for progressive ADL participation.   OT Frequency:  Min 2X/week       AM-PAC OT 6 Clicks Daily Activity     Outcome Measure Help from another person eating meals?: A Little Help from another person taking care of personal grooming?: A Little Help from another person toileting, which includes using toliet, bedpan, or urinal?: A Lot Help from another person bathing (including washing, rinsing, drying)?: A Lot Help from another person to put on and taking off regular upper body clothing?: A Little Help from another person to put on and taking off regular lower body clothing?: Total 6 Click Score: 14   End of Session Equipment Utilized During Treatment: Oxygen Nurse Communication: Mobility status  Activity Tolerance: Patient limited by pain Patient left: in bed;with call bell/phone within reach;with bed alarm set;with nursing/sitter in room  OT Visit Diagnosis: Unsteadiness on feet (R26.81);Other abnormalities of gait and mobility (R26.89);Muscle weakness (generalized) (M62.81);Pain Pain - part of body:  (abdomen)  Time: 8343-8286 OT Time Calculation (min): 17 min Charges:  OT General Charges $OT Visit: 1 Visit OT Evaluation $OT Eval Moderate Complexity: 1 Mod    Keigan Girten J Harris, OTR/L 11/08/2024, 5:51 PM

## 2024-11-08 NOTE — Assessment & Plan Note (Addendum)
 No growth on cultures Thought to be due to intraabdominal infection Completed antibiotics x 5 days

## 2024-11-08 NOTE — Assessment & Plan Note (Addendum)
 I have reviewed this patient in the [place] Controlled Substances Reporting System.  She is receiving medications from only one provider and appears to be taking them as prescribed. She is not at particularly high risk of opioid misuse, diversion, or overdose.  She appears to be taking oxycodone  and Clonazepam  consistently, Tramadol  less frequently Currently not prescribed opiates, complaining of some abdominal pain today but overall seems to be doing well

## 2024-11-08 NOTE — Assessment & Plan Note (Signed)
-  She does not appear to be taking medications for this issue at this time

## 2024-11-08 NOTE — Assessment & Plan Note (Signed)
 2017 echo with preserved EF and grade 1 diastolic dysfunction EF also preserved on MPS in 12/2018 Appears compensated

## 2024-11-08 NOTE — Assessment & Plan Note (Signed)
 No longer requiring Precedex  PRN haldol ordered but not needed Resumed Klonopin  Appears to no longer be taking Venlafaxine 

## 2024-11-09 DIAGNOSIS — K631 Perforation of intestine (nontraumatic): Secondary | ICD-10-CM | POA: Diagnosis not present

## 2024-11-09 LAB — GLUCOSE, CAPILLARY
Glucose-Capillary: 109 mg/dL — ABNORMAL HIGH (ref 70–99)
Glucose-Capillary: 132 mg/dL — ABNORMAL HIGH (ref 70–99)
Glucose-Capillary: 139 mg/dL — ABNORMAL HIGH (ref 70–99)
Glucose-Capillary: 83 mg/dL (ref 70–99)
Glucose-Capillary: 92 mg/dL (ref 70–99)
Glucose-Capillary: 94 mg/dL (ref 70–99)

## 2024-11-09 LAB — BASIC METABOLIC PANEL WITH GFR
Anion gap: 8 (ref 5–15)
BUN: 13 mg/dL (ref 8–23)
CO2: 26 mmol/L (ref 22–32)
Calcium: 8.1 mg/dL — ABNORMAL LOW (ref 8.9–10.3)
Chloride: 109 mmol/L (ref 98–111)
Creatinine, Ser: 0.81 mg/dL (ref 0.44–1.00)
GFR, Estimated: >60 mL/min
Glucose, Bld: 94 mg/dL (ref 70–99)
Potassium: 3.4 mmol/L — ABNORMAL LOW (ref 3.5–5.1)
Sodium: 143 mmol/L (ref 135–145)

## 2024-11-09 LAB — CBC
HCT: 28.8 % — ABNORMAL LOW (ref 36.0–46.0)
Hemoglobin: 9.6 g/dL — ABNORMAL LOW (ref 12.0–15.0)
MCH: 34.3 pg — ABNORMAL HIGH (ref 26.0–34.0)
MCHC: 33.3 g/dL (ref 30.0–36.0)
MCV: 102.9 fL — ABNORMAL HIGH (ref 80.0–100.0)
Platelets: 34 K/uL — ABNORMAL LOW (ref 150–400)
RBC: 2.8 MIL/uL — ABNORMAL LOW (ref 3.87–5.11)
RDW: 15.9 % — ABNORMAL HIGH (ref 11.5–15.5)
WBC: 7.6 K/uL (ref 4.0–10.5)
nRBC: 0 % (ref 0.0–0.2)

## 2024-11-09 MED ORDER — SUCRALFATE 1 GM/10ML PO SUSP
1.0000 g | Freq: Three times a day (TID) | ORAL | Status: DC
  Administered 2024-11-09 – 2024-11-14 (×20): 1 g via ORAL
  Filled 2024-11-09 (×20): qty 10

## 2024-11-09 MED ORDER — POTASSIUM CHLORIDE 20 MEQ PO PACK
40.0000 meq | PACK | Freq: Once | ORAL | Status: AC
  Administered 2024-11-09: 40 meq via ORAL
  Filled 2024-11-09: qty 2

## 2024-11-09 MED ORDER — OXYCODONE HCL 5 MG PO TABS
5.0000 mg | ORAL_TABLET | ORAL | Status: DC | PRN
  Administered 2024-11-09 – 2024-11-14 (×14): 5 mg via ORAL
  Filled 2024-11-09 (×14): qty 1

## 2024-11-09 MED ORDER — ADULT MULTIVITAMIN W/MINERALS CH
1.0000 | ORAL_TABLET | Freq: Every day | ORAL | Status: DC
  Administered 2024-11-09 – 2024-11-14 (×6): 1 via ORAL
  Filled 2024-11-09 (×6): qty 1

## 2024-11-09 MED ORDER — ENSURE MAX PROTEIN PO LIQD
11.0000 [oz_av] | Freq: Two times a day (BID) | ORAL | Status: DC
  Administered 2024-11-09 – 2024-11-13 (×8): 11 [oz_av] via ORAL
  Filled 2024-11-09 (×11): qty 330

## 2024-11-09 NOTE — Assessment & Plan Note (Addendum)
 S/p ex-lap and omental patch on 12/26 Abdomen closed with R JP drain, minimal drainage today PRN dilaudid  for pain Management per surgery NG tube removed CLD -> fulls  Has a rectal tube in which can likely be removed On Protonix  and Carafate

## 2024-11-09 NOTE — Consult Note (Signed)
 Initial Consultation Note   Patient: [name] FMW:[id number] DOB: 04/22/1946 PCP: Antonio Cyndee Jamee JONELLE, DO DOA: 11/02/2024 DOS: the patient was seen and examined on 11/09/2024 Primary service: Kristopher, Md, MD  Referring physician: Surgery Reason for consult: Medical management   Assessment and Plan:  Assessment & Plan Small bowel perforation (HCC) Perforated gastric ulcer (HCC) S/p ex-lap and omental patch on 12/26 Abdomen closed with R JP drain, minimal drainage today PRN dilaudid  for pain Management per surgery NG tube removed CLD -> fulls  Has a rectal tube in which can likely be removed On Protonix  and Carafate Acute respiratory failure with hypoxemia (HCC) Extubated 12/27, on room air now Sepsis (HCC) No growth on cultures Thought to be due to intraabdominal infection Completed antibiotics x 5 days Delirium due to general medical condition Generalized anxiety disorder No longer requiring Precedex  PRN haldol ordered but not needed Resumed Klonopin  Resumed Venlafaxine  Primary hypertension Prn hydralazine  and labetalol  Resumed amlodipine , losartan  Mild diastolic dysfunction 2017 echo with preserved EF and grade 1 diastolic dysfunction EF also preserved on MPS in 12/2018 Appears compensated Hyperlipidemia She does not appear to be taking medications for this issue at this time  Chronic pain disorder I have reviewed this patient in the [place] Controlled Substances Reporting System.  She is receiving medications from only one provider and appears to be taking them as prescribed. She is not at particularly high risk of opioid misuse, diversion, or overdose.  She appears to be taking oxycodone  and Clonazepam  consistently, Tramadol  less frequently at home Currently controlled with morphine , tizanidine  resumed; will add oxycodone  since she is taking PO PT/OT consulting, recommending STR Thrombocytopenia ANEMIA, MILD Thrombocytopenia thought to be due to zosyn  and sepsis. HIT  less likely, 3 points on 4T scoring No longer on Zosyn  Platelets up today from 23 to 31 to 34 Will continue to follow daily Hgb stable, 11.2 today Recommended holding chemical DVT prophylaxis for now and continuing SCDs       TRH will continue to follow the patient.  HPI: [name] is a 79 y.o. female with past medical history of HTN, tremor, solitary kidney, chronic pain with spinal cord stimulator and anxiety d/o who presented on 12/26 with abdominal pain.  CT with large free air c/w bowel perforation. She was taken emergently for ex lap and was found to have a perforation at the gastrojejunal junction; omental patch repair performed.   Review of Systems: As mentioned in the history of present illness. All other systems reviewed and are negative. Past Medical History:  Diagnosis Date   Abrasion of right heel    during admission 10/ 2019 right heel open skin due to movement on sheet   ADD (attention deficit disorder)    Anemia, mild    Anxiety    Arthritis    joints, back   At high risk for falls    10-19-2018 pt has fell twice in past week, tripped   Chronic back pain    all over my back (10/02/2013)   Crushing injury of arm, right 02/06/1989's   it was crushed; wore cast from fingers to top of my shoulder (11/25/2   Crushing injury of left wrist 05/11/1989's   DOE (dyspnea on exertion)    Essential tremor    Fibromyalgia    History of palpitations 2002   recurrent   History of panic attacks    History of pneumonia 08/21/2018   w/ acute respiratory failure/ hypoxia   History of sepsis  admission 08-21-2018  secondary to uropathy obstructive from ureteral stone    Hyperlipidemia    Hypertension    10-19-2018 PER PT AMLODIPINE  ON HOLD SINCE 10/ 2019 DUE TO KIDNEY ISSUES PER DOCTOR   Idiopathic scoliosis 04/19/2013   Left ureteral stone    Migraines    once/month (10/02/2013)   Osteoporosis    Other vitamin B12 deficiency anemia    PONV (postoperative nausea  and vomiting)    As a child   Pulmonary nodules/lesions, multiple    Renal insufficiency    S/P gastric bypass 09/16/2003   S/P insertion of spinal cord stimulator    per pt remote is missing   Wears dentures    upper   Wears glasses    Wears glasses    Past Surgical History:  Procedure Laterality Date   ABDOMINAL HYSTERECTOMY  1980's   W/  BSO  AND APPENDECTOMY   CARDIAC CATHETERIZATION  12-13-2002    dr delford   normal coronaries   CYSTOSCOPY WITH URETEROSCOPY AND STENT PLACEMENT Left 09/27/2018   Procedure: LEFT  URETEROSCOPY, HOLMIUM LASER LITHOTRIPSY;  Surgeon: Cam Morene ORN, MD;  Location: [facility];  Service: Urology;  Laterality: Left;   D & C HYSTERSCOPY RESECTION MYOMECTOMY  1980s   HUMERUS FRACTURE SURGERY Left 04/17/2019    Comminuted complex left intra-articular distal humerus fracture/supracondylar humerus fracture displaced   INTRAMEDULLARY (IM) NAIL INTERTROCHANTERIC Right 06/01/2019   Procedure: INTRAMEDULLARY (IM) NAIL INTERTROCHANTRIC;  Surgeon: Melodi Lerner, MD;  Location: [facility];  Service: Orthopedics;  Laterality: Right;   IR NEPHROSTOMY PLACEMENT LEFT  08/22/2018   IR NEPHROSTOMY PLACEMENT LEFT  12/08/2018   KNEE ARTHROSCOPY Right 03-31-2001    dr amy @[facility]    LAPAROTOMY N/A 11/02/2024   Procedure: LAPAROTOMY, EXPLORATORY AND OMENTAL PATCH REPAIR OF PERFORATION OF GASTROJEJUNOSTOMY;  Surgeon: Curvin Deward MOULD, MD;  Location: [facility];  Service: General;  Laterality: N/A;   NEPHROLITHOTOMY Left 10/20/2018   Procedure: LEFT NEPHROLITHOTOMY PERCUTANEOUS;  Surgeon: Cam Morene ORN, MD;  Location: [facility];  Service: Urology;  Laterality: Left;   ORIF HUMERUS FRACTURE Left 04/17/2019   Procedure: Open reduction internal fixation left supracondylar humerus fracture with olecranon osteotomy and ulnar nerve release and anterior transposition and repair of structures as necessary;  Surgeon: Camella Fallow, MD;  Location: [facility];  Service: Orthopedics;  Laterality:  Left;  3 hrs   REDUCTION MAMMAPLASTY Bilateral 2001   ROBOT ASSISTED LAPAROSCOPIC NEPHRECTOMY Left 12/25/2018   Procedure: XI ROBOTIC ASSISTED LAPAROSCOPIC NEPHRECTOMY, LEFT FLANK EXPLORATION WITH REMOVAL OF BATTERY PACK;  Surgeon: Cam Morene ORN, MD;  Location: [facility];  Service: Urology;  Laterality: Left;   ROUX-EN-Y GASTRIC BYPASS  09-16-2003   dr [email or web address]. [email or web address]  @[facility]    via Laparoscopy w/ gastrojejunostomy   SPINAL CORD STIMULATOR IMPLANT  2016   10-19-2018  PER PT HAS REMOTE BUT HAS NOT USED IT SINCE DEC 2018   TONSILLECTOMY AND ADENOIDECTOMY  1955   TUBAL LIGATION  1980's   Social History:  reports that she has never smoked. She has never used smokeless tobacco. She reports that she does not drink alcohol and does not use drugs.  Allergies[1]  Family History  Adopted: Yes  Problem Relation Age of Onset   COPD Mother    Emphysema Mother    Heart attack Mother    Other Mother        tobacco abuse    Prior to Admission medications  Medication Sig Start Date End Date Taking? Authorizing Provider  amLODipine  (NORVASC ) 5 MG tablet 1 po bid 10/22/24  Yes Lowne Chase, [name], DO  clonazePAM  (KLONOPIN ) 0.5 MG tablet Take 1 tablet (0.5 mg total) by mouth 3 (three) times daily as needed. 10/22/24  Yes Antonio Cyndee Rockers R, DO  loperamide  (IMODIUM ) 2 MG capsule TAKE 1 CAPSULE BY MOUTH DAILY AS NEEDED FOR DIARRHEA OR LOOSE STOOLS 07/27/24  Yes Antonio Cyndee Rockers R, DO  losartan  (COZAAR ) 50 MG tablet TAKE 1 TABLET BY MOUTH DAILY 10/16/24  Yes Lowne Chase, [name], DO  ondansetron  (ZOFRAN -ODT) 8 MG disintegrating tablet Take 1 tablet (8 mg total) by mouth every 8 (eight) hours as needed for nausea or vomiting. 05/10/24  Yes Antonio Cyndee Rockers R, DO  oxyCODONE  (ROXICODONE ) 15 MG immediate release tablet Take 1 tablet (15 mg total) by mouth 4 (four) times daily. Patient taking differently: Take 15 mg by mouth See admin instructions. Take 15 mg by mouth every four to six hours 04/19/19  Yes  Camella Fallow, MD  SUMAtriptan  (IMITREX ) 50 MG tablet TAKE 1 TABLET BY MOUTH AT ONSET OF MIGRAINE; MAY REPEAT 1 TABLET IN 2 HOURS IF NEEDED. 10/22/24  Yes Antonio Cyndee Rockers R, DO  tiZANidine  (ZANAFLEX ) 4 MG tablet Take 4 mg by mouth at bedtime. 07/05/24  Yes [provider]  traMADol  (ULTRAM ) 50 MG tablet Take 50 mg by mouth 2 (two) times daily as needed (for pain).   Yes [provider]  TYLENOL  500 MG tablet Take 500-1,000 mg by mouth every 6 (six) hours as needed for mild pain (pain score 1-3) (or headaches).   Yes [provider]  venlafaxine  XR (EFFEXOR -XR) 75 MG 24 hr capsule Take 2 capsules (150 mg total) by mouth daily. 08/20/24  Yes Antonio Cyndee Rockers JONELLE, DO    Physical Exam: Vitals:   11/08/24 1754 11/08/24 2115 11/09/24 0128 11/09/24 0411  BP: 120/69 137/78 (!) 90/56 116/76  Pulse: (!) 102 (!) 105 86 83  Resp: 18 16 18 18   Temp: 98.8 F (37.1 C) 97.9 F (36.6 C) 98.9 F (37.2 C) 97.8 F (36.6 C)  TempSrc: Oral Oral Axillary Axillary  SpO2: 100% 97% 98% 96%  Weight:      Height:         Subjective: Doing ok.  Pain is reasonably controlled, not stooling excessively, tolerating full liquids.     Intake/Output Summary (Last 24 hours) at 11/09/2024 0735 Last data filed at 11/08/2024 2341 Gross per 24 hour  Intake 30 ml  Output 710 ml  Net -680 ml   Filed Weights   11/06/24 0500 11/07/24 0500  Weight: 66.2 kg 68.4 kg    Exam:  General:  Appears calm and comfortable and is in NAD, frail, chronically ill appearing Eyes:  normal lids, iris ENT:  grossly normal hearing, lips & tongue, mmm Cardiovascular:  RRR, no m/r/g. No LE edema.  Respiratory:   CTA bilaterally with no wheezes/rales/rhonchi.  Normal respiratory effort. Abdomen:  appropriately TTP with large surgical bandage that is C/D/I, JP drain with scant serosanguinous output Skin:  no rash or induration seen on limited exam Musculoskeletal:  generalized weakness, no bony  abnormality Psychiatric:  blunted mood and affect, speech fluent and appropriate, AOx3 Neurologic:  CN 2-12 grossly intact, moves all extremities in coordinated fashion  Data Reviewed: I have reviewed the patient's lab results since admission.  Pertinent labs for today include:  K+ 3.4 WBC 7.6 Hgb 9.6 Platelets 34     Family Communication: None present Primary team  communication: I spoke with Dr. Tanda today Thank you very much for involving us  in the care of your patient.  Author: Delon Herald, MD 11/09/2024 7:34 AM  For on call review [email or web address].      [1]  Allergies Allergen Reactions   Zanaflex  [Tizanidine ] Other (See Comments)    Hallucinations only with higher doses- otherwise, it is tolerated    Adhesive [Tape] Itching   Latex Itching and Rash

## 2024-11-09 NOTE — Assessment & Plan Note (Addendum)
 Thrombocytopenia thought to be due to zosyn  and sepsis. HIT less likely, 3 points on 4T scoring No longer on Zosyn  Platelets up today from 23 to 31 to 34 Will continue to follow daily Hgb stable, 11.2 today Recommended holding chemical DVT prophylaxis for now and continuing SCDs

## 2024-11-09 NOTE — Assessment & Plan Note (Signed)
-  She does not appear to be taking medications for this issue at this time

## 2024-11-09 NOTE — Progress Notes (Signed)
 PT Cancellation Note  Patient Details Name: [name] MRN: [id number] DOB: Nov 18, 1945   Cancelled Treatment:    Reason Eval/Treat Not Completed: Other (comment). Pt reports too tired for PT today, requesting PT check back tomorrow. Will continue to follow for PT as schedule permits.   Tori Alece Koppel PT, DPT 11/09/2024, 1:17 PM

## 2024-11-09 NOTE — Assessment & Plan Note (Signed)
 Extubated 12/27, on room air now

## 2024-11-09 NOTE — Assessment & Plan Note (Signed)
 2017 echo with preserved EF and grade 1 diastolic dysfunction EF also preserved on MPS in 12/2018 Appears compensated

## 2024-11-09 NOTE — Assessment & Plan Note (Addendum)
 I have reviewed this patient in the [place] Controlled Substances Reporting System.  She is receiving medications from only one provider and appears to be taking them as prescribed. She is not at particularly high risk of opioid misuse, diversion, or overdose.  She appears to be taking oxycodone  and Clonazepam  consistently, Tramadol  less frequently at home Currently controlled with morphine , tizanidine  resumed; will add oxycodone  since she is taking PO PT/OT consulting, recommending STR

## 2024-11-09 NOTE — Plan of Care (Signed)
" °  Problem: Safety: Goal: Ability to remain free from injury will improve Outcome: Progressing   Problem: Skin Integrity: Goal: Risk for impaired skin integrity will decrease Outcome: Progressing   Problem: Coping: Goal: Ability to adjust to condition or change in health will improve Outcome: Progressing   Problem: Fluid Volume: Goal: Ability to maintain a balanced intake and output will improve Outcome: Progressing   Problem: Metabolic: Goal: Ability to maintain appropriate glucose levels will improve Outcome: Progressing   "

## 2024-11-09 NOTE — Assessment & Plan Note (Addendum)
 No longer requiring Precedex  PRN haldol ordered but not needed Resumed Klonopin  Resumed Venlafaxine 

## 2024-11-09 NOTE — Assessment & Plan Note (Signed)
 No growth on cultures Thought to be due to intraabdominal infection Completed antibiotics x 5 days

## 2024-11-09 NOTE — Progress Notes (Addendum)
 7 Days Post-Op   Subjective/Chief Complaint: Tol fulls, not oob, foley out, having bowel function but reports loose.  Has rectal tube in    Objective: Vital signs in last 24 hours: Temp:  [97.8 F (36.6 C)-98.9 F (37.2 C)] 98.1 F (36.7 C) (01/02 0916) Pulse Rate:  [83-105] 94 (01/02 0916) Resp:  [16-24] 16 (01/02 0916) BP: (90-138)/(56-82) 126/66 (01/02 0916) SpO2:  [96 %-100 %] 96 % (01/02 0916) Weight:  [60.7 kg] 60.7 kg (01/02 0702) Last BM Date : 11/08/24  Intake/Output from previous day: 01/01 0701 - 01/02 0700 In: 30  Out: 710 [Urine:250; Drains:10; Stool:450] Intake/Output this shift: Total I/O In: 0  Out: 150 [Urine:150]  General nad Cv regular Pulm effort normal Ab wound clean, approp tender, jp ss  Lab Results:  Recent Labs    11/08/24 0326 11/09/24 0252  WBC 8.6 7.6  HGB 11.2* 9.6*  HCT 33.4* 28.8*  PLT 31* 34*   BMET Recent Labs    11/08/24 0326 11/09/24 0252  NA 142 143  K 3.1* 3.4*  CL 105 109  CO2 25 26  GLUCOSE 124* 94  BUN 16 13  CREATININE 0.86 0.81  CALCIUM 8.6* 8.1*   PT/INR No results for input(s): LABPROT, INR in the last 72 hours. ABG No results for input(s): PHART, HCO3 in the last 72 hours.  Invalid input(s): PCO2, PO2  Studies/Results: No results found.   Anti-infectives: Anti-infectives (From admission, onward)    Start     Dose/Rate Route Frequency Ordered Stop   11/07/24 1000  cefTRIAXone  (ROCEPHIN ) 2 g in sodium chloride  0.9 % 100 mL IVPB  Status:  Discontinued        2 g 200 mL/hr over 30 Minutes Intravenous Every 24 hours 11/07/24 0808 11/08/24 0734   11/07/24 1000  metroNIDAZOLE  (FLAGYL ) IVPB 500 mg  Status:  Discontinued        500 mg 100 mL/hr over 60 Minutes Intravenous Every 12 hours 11/07/24 0808 11/08/24 0734   11/05/24 0800  vancomycin  (VANCOREADY) IVPB 750 mg/150 mL  Status:  Discontinued        750 mg 150 mL/hr over 60 Minutes Intravenous Every 48 hours 11/03/24 0725 11/03/24 0751    11/04/24 2200  vancomycin  (VANCOREADY) IVPB 750 mg/150 mL  Status:  Discontinued        750 mg 150 mL/hr over 60 Minutes Intravenous Every 48 hours 11/02/24 2154 11/03/24 0725   11/03/24 2200  piperacillin -tazobactam (ZOSYN ) IVPB 3.375 g  Status:  Discontinued        3.375 g 12.5 mL/hr over 240 Minutes Intravenous Every 8 hours 11/03/24 1903 11/07/24 0807   11/03/24 0845  linezolid  (ZYVOX ) IVPB 600 mg  Status:  Discontinued        600 mg 300 mL/hr over 60 Minutes Intravenous Every 12 hours 11/03/24 0751 11/06/24 0820   11/03/24 0815  vancomycin  (VANCOCIN ) IVPB 1000 mg/200 mL premix  Status:  Discontinued        1,000 mg 200 mL/hr over 60 Minutes Intravenous  Once 11/03/24 0725 11/03/24 0751   11/03/24 0415  metroNIDAZOLE  (FLAGYL ) IVPB 500 mg  Status:  Discontinued        500 mg 100 mL/hr over 60 Minutes Intravenous Every 12 hours 11/03/24 0315 11/03/24 0751   11/03/24 0415  piperacillin -tazobactam (ZOSYN ) IVPB 2.25 g  Status:  Discontinued        2.25 g 100 mL/hr over 30 Minutes Intravenous Every 8 hours 11/03/24 0319 11/03/24 1903   11/02/24  2130  vancomycin  (VANCOCIN ) IVPB 1000 mg/200 mL premix  Status:  Discontinued        1,000 mg 200 mL/hr over 60 Minutes Intravenous  Once 11/02/24 2122 11/03/24 0725   11/02/24 2030  ceFEPIme  (MAXIPIME ) 2 g in sodium chloride  0.9 % 100 mL IVPB        2 g 200 mL/hr over 30 Minutes Intravenous  Once 11/02/24 2018 11/02/24 2125   11/02/24 2030  metroNIDAZOLE  (FLAGYL ) IVPB 500 mg        500 mg 100 mL/hr over 60 Minutes Intravenous  Once 11/02/24 2018 11/03/24 2000       Assessment/Plan: POD 6 omental patch of perforation of GJ (prior bypass)- PT -keep on bari fulls today; add bari shake -SCD for dvt prophylaxis - holding chemical vte prophylaxis due to thrombocytopenia -l5 days postop abx can stop today -protonix  bid, add carafate given ulcer -dc tele - d/w TRH -she needs to be oob -PT, OT written for -add MVI given gastric bypass  here -repeat CBC in am to trend hgb and plts  Acute anemia - was stable at 11, now 9.6 on 11/2, repeat in am, check anemia panel given h/o bypass  Hypokalemia - replaced by TRH  HTN - home meds  Endo - will stop blood glucose monitoring, SSI coverage; dont see hx of diabetes  -dispo planning, toc written for  [employer]. Tanda, MD, FACS General, Bariatric, & Minimally Invasive Surgery [facility],  [facility]   Camellia Tanda 11/09/2024

## 2024-11-09 NOTE — Assessment & Plan Note (Signed)
 Prn hydralazine  and labetalol  Resumed amlodipine , losartan 

## 2024-11-10 DIAGNOSIS — K631 Perforation of intestine (nontraumatic): Secondary | ICD-10-CM | POA: Diagnosis not present

## 2024-11-10 LAB — CBC
HCT: 31.4 % — ABNORMAL LOW (ref 36.0–46.0)
Hemoglobin: 10.3 g/dL — ABNORMAL LOW (ref 12.0–15.0)
MCH: 33.8 pg (ref 26.0–34.0)
MCHC: 32.8 g/dL (ref 30.0–36.0)
MCV: 103 fL — ABNORMAL HIGH (ref 80.0–100.0)
Platelets: 57 K/uL — ABNORMAL LOW (ref 150–400)
RBC: 3.05 MIL/uL — ABNORMAL LOW (ref 3.87–5.11)
RDW: 15.9 % — ABNORMAL HIGH (ref 11.5–15.5)
WBC: 9.7 K/uL (ref 4.0–10.5)
nRBC: 0 % (ref 0.0–0.2)

## 2024-11-10 LAB — BASIC METABOLIC PANEL WITH GFR
Anion gap: 8 (ref 5–15)
BUN: 13 mg/dL (ref 8–23)
CO2: 26 mmol/L (ref 22–32)
Calcium: 8.2 mg/dL — ABNORMAL LOW (ref 8.9–10.3)
Chloride: 106 mmol/L (ref 98–111)
Creatinine, Ser: 0.76 mg/dL (ref 0.44–1.00)
GFR, Estimated: >60 mL/min
Glucose, Bld: 102 mg/dL — ABNORMAL HIGH (ref 70–99)
Potassium: 3.7 mmol/L (ref 3.5–5.1)
Sodium: 139 mmol/L (ref 135–145)

## 2024-11-10 LAB — FOLATE: Folate: 7.2 ng/mL

## 2024-11-10 LAB — RETICULOCYTES
Immature Retic Fract: 23.5 % — ABNORMAL HIGH (ref 2.3–15.9)
RBC.: 3.38 MIL/uL — ABNORMAL LOW (ref 3.87–5.11)
Retic Count, Absolute: 53.7 K/uL (ref 19.0–186.0)
Retic Ct Pct: 1.6 % (ref 0.4–3.1)

## 2024-11-10 LAB — VITAMIN B12: Vitamin B-12: 1769 pg/mL — ABNORMAL HIGH (ref 180–914)

## 2024-11-10 LAB — IRON AND TIBC
Iron: 27 ug/dL — ABNORMAL LOW (ref 28–170)
Saturation Ratios: 14 % (ref 10.4–31.8)
TIBC: 197 ug/dL — ABNORMAL LOW (ref 250–450)
UIBC: 171 ug/dL

## 2024-11-10 LAB — FERRITIN: Ferritin: 330 ng/mL — ABNORMAL HIGH (ref 11–307)

## 2024-11-10 LAB — MAGNESIUM: Magnesium: 2.1 mg/dL (ref 1.7–2.4)

## 2024-11-10 MED ORDER — ACETAMINOPHEN 500 MG PO TABS
1000.0000 mg | ORAL_TABLET | Freq: Once | ORAL | Status: AC
  Administered 2024-11-10: 1000 mg via ORAL
  Filled 2024-11-10: qty 2

## 2024-11-10 NOTE — Assessment & Plan Note (Addendum)
 Thrombocytopenia thought to be due to zosyn  and sepsis. HIT less likely, 3 points on 4T scoring No longer on Zosyn  Platelets up  from 23 to 57 Will continue to follow daily Hgb stable, 10.3 today It appears to be ok to start Lovenox  for DVT prevention at this time if desired by surgery

## 2024-11-10 NOTE — Assessment & Plan Note (Signed)
 2017 echo with preserved EF and grade 1 diastolic dysfunction EF also preserved on MPS in 12/2018 Appears compensated

## 2024-11-10 NOTE — Assessment & Plan Note (Signed)
-  She does not appear to be taking medications for this issue at this time

## 2024-11-10 NOTE — Consult Note (Signed)
 Initial Consultation Note   Patient: [name] FMW:[id number] DOB: June 08, 1946 PCP: Antonio Cyndee Jamee JONELLE, DO DOA: 11/02/2024 DOS: the patient was seen and examined on 11/10/2024 Primary service: Kristopher, Md, MD  Referring physician: Surgery Reason for consult: Medical management   Assessment and Plan:  Assessment & Plan Small bowel perforation (HCC) Perforated gastric ulcer (HCC) S/p ex-lap and omental patch on 12/26 Abdomen closed with R JP drain, minimal drainage today PRN dilaudid  for pain Management per surgery NG tube removed CLD -> fulls -> regular On Protonix  and Carafate  Wound care changed to once daily with addition of fungal powder to wound Acute respiratory failure with hypoxemia (HCC) Extubated 12/27, on room air  Started back on O2 for uncertain reasons Will wean back to RA Sepsis (HCC) No growth on cultures Thought to be due to intraabdominal infection Completed antibiotics x 5 days Delirium due to general medical condition Generalized anxiety disorder No longer requiring Precedex  PRN haldol  ordered but not needed Resumed Klonopin , Venlafaxine  Primary hypertension Prn hydralazine  and labetalol  Resumed amlodipine , losartan  Good control, current BP 138/73 Mild diastolic dysfunction 2017 echo with preserved EF and grade 1 diastolic dysfunction EF also preserved on MPS in 12/2018 Appears compensated Hyperlipidemia She does not appear to be taking medications for this issue at this time  Chronic pain disorder I have reviewed this patient in the [place] Controlled Substances Reporting System.  She is receiving medications from only one provider and appears to be taking them as prescribed. She is not at particularly high risk of opioid misuse, diversion, or overdose.  She appears to be taking oxycodone  and Clonazepam  consistently, Tramadol  less frequently at home Currently controlled with morphine , tizanidine  resumed; will add oxycodone  since she is taking PO PT/OT  consulting, recommending STR Thrombocytopenia ANEMIA, MILD Thrombocytopenia thought to be due to zosyn  and sepsis. HIT less likely, 3 points on 4T scoring No longer on Zosyn  Platelets up  from 23 to 57 Will continue to follow daily Hgb stable, 10.3 today It appears to be ok to start Lovenox  for DVT prevention at this time if desired by surgery        [facility] will continue to follow the patient.  HPI: [name] is a 79 y.o. female with past medical history of HTN, tremor, solitary kidney, chronic pain with spinal cord stimulator and anxiety d/o who presented on 12/26 with abdominal pain. CT with large free air c/w bowel perforation. She was taken emergently for ex lap and was found to have a perforation at the gastrojejunal junction; omental patch repair performed.   Slow but gradual improvement.  Review of Systems: As mentioned in the history of present illness. All other systems reviewed and are negative. Past Medical History:  Diagnosis Date   Abrasion of right heel    during admission 10/ 2019 right heel open skin due to movement on sheet   ADD (attention deficit disorder)    Anemia, mild    Anxiety    Arthritis    joints, back   At high risk for falls    10-19-2018 pt has fell twice in past week, tripped   Chronic back pain    all over my back (10/02/2013)   Crushing injury of arm, right 02/06/1989's   it was crushed; wore cast from fingers to top of my shoulder (11/25/2   Crushing injury of left wrist 05/11/1989's   DOE (dyspnea on exertion)    Essential tremor    Fibromyalgia    History of  palpitations 2002   recurrent   History of panic attacks    History of pneumonia 08/21/2018   w/ acute respiratory failure/ hypoxia   History of sepsis    admission 08-21-2018  secondary to uropathy obstructive from ureteral stone    Hyperlipidemia    Hypertension    10-19-2018 PER PT AMLODIPINE  ON HOLD SINCE 10/ 2019 DUE TO KIDNEY ISSUES PER DOCTOR   Idiopathic scoliosis  04/19/2013   Left ureteral stone    Migraines    once/month (10/02/2013)   Osteoporosis    Other vitamin B12 deficiency anemia    PONV (postoperative nausea and vomiting)    As a child   Pulmonary nodules/lesions, multiple    Renal insufficiency    S/P gastric bypass 09/16/2003   S/P insertion of spinal cord stimulator    per pt remote is missing   Wears dentures    upper   Wears glasses    Wears glasses    Past Surgical History:  Procedure Laterality Date   ABDOMINAL HYSTERECTOMY  1980's   W/  BSO  AND APPENDECTOMY   CARDIAC CATHETERIZATION  12-13-2002    dr delford   normal coronaries   CYSTOSCOPY WITH URETEROSCOPY AND STENT PLACEMENT Left 09/27/2018   Procedure: LEFT  URETEROSCOPY, HOLMIUM LASER LITHOTRIPSY;  Surgeon: Cam Morene ORN, MD;  Location: [facility];  Service: Urology;  Laterality: Left;   D & C HYSTERSCOPY RESECTION MYOMECTOMY  1980s   HUMERUS FRACTURE SURGERY Left 04/17/2019    Comminuted complex left intra-articular distal humerus fracture/supracondylar humerus fracture displaced   INTRAMEDULLARY (IM) NAIL INTERTROCHANTERIC Right 06/01/2019   Procedure: INTRAMEDULLARY (IM) NAIL INTERTROCHANTRIC;  Surgeon: Melodi Lerner, MD;  Location: [facility];  Service: Orthopedics;  Laterality: Right;   IR NEPHROSTOMY PLACEMENT LEFT  08/22/2018   IR NEPHROSTOMY PLACEMENT LEFT  12/08/2018   KNEE ARTHROSCOPY Right 03-31-2001    dr amy @[facility]    LAPAROTOMY N/A 11/02/2024   Procedure: LAPAROTOMY, EXPLORATORY AND OMENTAL PATCH REPAIR OF PERFORATION OF GASTROJEJUNOSTOMY;  Surgeon: Curvin Deward MOULD, MD;  Location: [facility];  Service: General;  Laterality: N/A;   NEPHROLITHOTOMY Left 10/20/2018   Procedure: LEFT NEPHROLITHOTOMY PERCUTANEOUS;  Surgeon: Cam Morene ORN, MD;  Location: [facility];  Service: Urology;  Laterality: Left;   ORIF HUMERUS FRACTURE Left 04/17/2019   Procedure: Open reduction internal fixation left supracondylar humerus fracture with olecranon osteotomy and ulnar  nerve release and anterior transposition and repair of structures as necessary;  Surgeon: Camella Fallow, MD;  Location: [facility];  Service: Orthopedics;  Laterality: Left;  3 hrs   REDUCTION MAMMAPLASTY Bilateral 2001   ROBOT ASSISTED LAPAROSCOPIC NEPHRECTOMY Left 12/25/2018   Procedure: XI ROBOTIC ASSISTED LAPAROSCOPIC NEPHRECTOMY, LEFT FLANK EXPLORATION WITH REMOVAL OF BATTERY PACK;  Surgeon: Cam Morene ORN, MD;  Location: [facility];  Service: Urology;  Laterality: Left;   ROUX-EN-Y GASTRIC BYPASS  09-16-2003   dr [email or web address]. [email or web address]  @[facility]    via Laparoscopy w/ gastrojejunostomy   SPINAL CORD STIMULATOR IMPLANT  2016   10-19-2018  PER PT HAS REMOTE BUT HAS NOT USED IT SINCE DEC 2018   TONSILLECTOMY AND ADENOIDECTOMY  1955   TUBAL LIGATION  1980's   Social History:  reports that she has never smoked. She has never used smokeless tobacco. She reports that she does not drink alcohol and does not use drugs.  Allergies[1]  Family History  Adopted: Yes  Problem Relation Age of Onset   COPD Mother    Emphysema Mother    Heart  attack Mother    Other Mother        tobacco abuse    Prior to Admission medications  Medication Sig Start Date End Date Taking? Authorizing Provider  amLODipine  (NORVASC ) 5 MG tablet 1 po bid 10/22/24  Yes Lowne Chase, [name], DO  clonazePAM  (KLONOPIN ) 0.5 MG tablet Take 1 tablet (0.5 mg total) by mouth 3 (three) times daily as needed. 10/22/24  Yes Antonio Cyndee Rockers R, DO  loperamide  (IMODIUM ) 2 MG capsule TAKE 1 CAPSULE BY MOUTH DAILY AS NEEDED FOR DIARRHEA OR LOOSE STOOLS 07/27/24  Yes Antonio Cyndee Rockers R, DO  losartan  (COZAAR ) 50 MG tablet TAKE 1 TABLET BY MOUTH DAILY 10/16/24  Yes Lowne Chase, [name], DO  ondansetron  (ZOFRAN -ODT) 8 MG disintegrating tablet Take 1 tablet (8 mg total) by mouth every 8 (eight) hours as needed for nausea or vomiting. 05/10/24  Yes Antonio Cyndee Rockers R, DO  oxyCODONE  (ROXICODONE ) 15 MG immediate release tablet Take 1 tablet (15 mg total)  by mouth 4 (four) times daily. Patient taking differently: Take 15 mg by mouth See admin instructions. Take 15 mg by mouth every four to six hours 04/19/19  Yes Camella Fallow, MD  SUMAtriptan  (IMITREX ) 50 MG tablet TAKE 1 TABLET BY MOUTH AT ONSET OF MIGRAINE; MAY REPEAT 1 TABLET IN 2 HOURS IF NEEDED. 10/22/24  Yes Antonio Cyndee Rockers R, DO  tiZANidine  (ZANAFLEX ) 4 MG tablet Take 4 mg by mouth at bedtime. 07/05/24  Yes [provider]  traMADol  (ULTRAM ) 50 MG tablet Take 50 mg by mouth 2 (two) times daily as needed (for pain).   Yes [provider]  TYLENOL  500 MG tablet Take 500-1,000 mg by mouth every 6 (six) hours as needed for mild pain (pain score 1-3) (or headaches).   Yes [provider]  venlafaxine  XR (EFFEXOR -XR) 75 MG 24 hr capsule Take 2 capsules (150 mg total) by mouth daily. 08/20/24  Yes Antonio Cyndee Rockers JONELLE, DO    Physical Exam: Vitals:   11/09/24 2130 11/09/24 2147 11/10/24 0459 11/10/24 0536  BP: (!) 153/80  138/73   Pulse: 86 85 90   Resp: 18  20   Temp:  98.5 F (36.9 C) 98.2 F (36.8 C)   TempSrc:  Oral Oral   SpO2: 100% 100% 95%   Weight:    58.8 kg  Height:         Subjective: Feeling some better.  Has done some therapy.  Agrees with need for rehab.     Intake/Output Summary (Last 24 hours) at 11/10/2024 0736 Last data filed at 11/09/2024 2200 Gross per 24 hour  Intake 430 ml  Output 425 ml  Net 5 ml   Filed Weights   11/09/24 0702 11/10/24 0536  Weight: 60.7 kg 58.8 kg    Exam:  General:  Appears calm and comfortable and is in NAD Eyes:  normal lids, iris ENT:  grossly normal hearing, lips & tongue, mmm Cardiovascular:  RRR. No LE edema.  Respiratory:   CTA bilaterally with no wheezes/rales/rhonchi.  Normal respiratory effort. Abdomen:  soft, appropriately tender, bandage C/D/I, drain with scant drainage Skin:  no rash or induration seen on limited exam Musculoskeletal:  grossly normal tone BUE/BLE, good ROM, no bony  abnormality Psychiatric:  grossly normal mood and affect, speech fluent and appropriate, AOx3 Neurologic:  CN 2-12 grossly intact, moves all extremities in coordinated fashion  Data Reviewed: I have reviewed the patient's lab results since admission.  Pertinent labs for  today include:  Glucose 102 Iron 27, ferritin 330 B12 1769 WBC 9.7 Hgb 10.3 Platelets 57     Family Communication: None present; I left a message for her husband by telephone   Thank you very much for involving us  in the care of your patient.  Author: Delon Herald, MD 11/10/2024 7:35 AM  For on call review [email or web address].      [1]  Allergies Allergen Reactions   Zanaflex  [Tizanidine ] Other (See Comments)    Hallucinations only with higher doses- otherwise, it is tolerated    Adhesive [Tape] Itching   Latex Itching and Rash

## 2024-11-10 NOTE — Assessment & Plan Note (Addendum)
 S/p ex-lap and omental patch on 12/26 Abdomen closed with R JP drain, minimal drainage today PRN dilaudid  for pain Management per surgery NG tube removed CLD -> fulls -> regular On Protonix  and Carafate  Wound care changed to once daily with addition of fungal powder to wound

## 2024-11-10 NOTE — Assessment & Plan Note (Addendum)
 No longer requiring Precedex  PRN haldol  ordered but not needed Resumed Klonopin , Venlafaxine 

## 2024-11-10 NOTE — Assessment & Plan Note (Signed)
 I have reviewed this patient in the [place] Controlled Substances Reporting System.  She is receiving medications from only one provider and appears to be taking them as prescribed. She is not at particularly high risk of opioid misuse, diversion, or overdose.  She appears to be taking oxycodone  and Clonazepam  consistently, Tramadol  less frequently at home Currently controlled with morphine , tizanidine  resumed; will add oxycodone  since she is taking PO PT/OT consulting, recommending STR

## 2024-11-10 NOTE — Plan of Care (Signed)
  Problem: Clinical Measurements: Goal: Respiratory complications will improve Outcome: Progressing Goal: Cardiovascular complication will be avoided Outcome: Progressing   Problem: Activity: Goal: Risk for activity intolerance will decrease Outcome: Progressing   Problem: Coping: Goal: Level of anxiety will decrease Outcome: Progressing   Problem: Pain Managment: Goal: General experience of comfort will improve and/or be controlled Outcome: Progressing   Problem: Skin Integrity: Goal: Risk for impaired skin integrity will decrease Outcome: Progressing

## 2024-11-10 NOTE — Assessment & Plan Note (Addendum)
 Extubated 12/27, on room air  Started back on O2 for uncertain reasons Will wean back to RA

## 2024-11-10 NOTE — Assessment & Plan Note (Signed)
 No growth on cultures Thought to be due to intraabdominal infection Completed antibiotics x 5 days

## 2024-11-10 NOTE — Assessment & Plan Note (Addendum)
 Prn hydralazine  and labetalol  Resumed amlodipine , losartan  Good control, current BP 138/73

## 2024-11-10 NOTE — Progress Notes (Signed)
 8 Days Post-Op   Subjective/Chief Complaint: Tolerating fulls comfortable   Objective: Vital signs in last 24 hours: Temp:  [98.1 F (36.7 C)-98.5 F (36.9 C)] 98.2 F (36.8 C) (01/03 0459) Pulse Rate:  [85-97] 90 (01/03 0459) Resp:  [16-20] 20 (01/03 0459) BP: (126-153)/(66-84) 138/73 (01/03 0459) SpO2:  [95 %-100 %] 95 % (01/03 0459) Weight:  [58.8 kg] 58.8 kg (01/03 0536) Last BM Date : 11/09/24  Intake/Output from previous day: 01/02 0701 - 01/03 0700 In: 430 [P.O.:430] Out: 435 [Urine:300; Drains:35; Stool:100] Intake/Output this shift: No intake/output data recorded.  Exam: Awake and alert Abdomen soft, wound base is clean.  Looks like either skin maceration from getting too wet from dressing changes or a yeast infection  Lab Results:  Recent Labs    11/09/24 0252 11/10/24 0322  WBC 7.6 9.7  HGB 9.6* 10.3*  HCT 28.8* 31.4*  PLT 34* 57*   BMET Recent Labs    11/09/24 0252 11/10/24 0322  NA 143 139  K 3.4* 3.7  CL 109 106  CO2 26 26  GLUCOSE 94 102*  BUN 13 13  CREATININE 0.81 0.76  CALCIUM 8.1* 8.2*   PT/INR No results for input(s): LABPROT, INR in the last 72 hours. ABG No results for input(s): PHART, HCO3 in the last 72 hours.  Invalid input(s): PCO2, PO2  Studies/Results: No results found.  Anti-infectives: Anti-infectives (From admission, onward)    Start     Dose/Rate Route Frequency Ordered Stop   11/07/24 1000  cefTRIAXone  (ROCEPHIN ) 2 g in sodium chloride  0.9 % 100 mL IVPB  Status:  Discontinued        2 g 200 mL/hr over 30 Minutes Intravenous Every 24 hours 11/07/24 0808 11/08/24 0734   11/07/24 1000  metroNIDAZOLE  (FLAGYL ) IVPB 500 mg  Status:  Discontinued        500 mg 100 mL/hr over 60 Minutes Intravenous Every 12 hours 11/07/24 0808 11/08/24 0734   11/05/24 0800  vancomycin  (VANCOREADY) IVPB 750 mg/150 mL  Status:  Discontinued        750 mg 150 mL/hr over 60 Minutes Intravenous Every 48 hours 11/03/24 0725  11/03/24 0751   11/04/24 2200  vancomycin  (VANCOREADY) IVPB 750 mg/150 mL  Status:  Discontinued        750 mg 150 mL/hr over 60 Minutes Intravenous Every 48 hours 11/02/24 2154 11/03/24 0725   11/03/24 2200  piperacillin -tazobactam (ZOSYN ) IVPB 3.375 g  Status:  Discontinued        3.375 g 12.5 mL/hr over 240 Minutes Intravenous Every 8 hours 11/03/24 1903 11/07/24 0807   11/03/24 0845  linezolid  (ZYVOX ) IVPB 600 mg  Status:  Discontinued        600 mg 300 mL/hr over 60 Minutes Intravenous Every 12 hours 11/03/24 0751 11/06/24 0820   11/03/24 0815  vancomycin  (VANCOCIN ) IVPB 1000 mg/200 mL premix  Status:  Discontinued        1,000 mg 200 mL/hr over 60 Minutes Intravenous  Once 11/03/24 0725 11/03/24 0751   11/03/24 0415  metroNIDAZOLE  (FLAGYL ) IVPB 500 mg  Status:  Discontinued        500 mg 100 mL/hr over 60 Minutes Intravenous Every 12 hours 11/03/24 0315 11/03/24 0751   11/03/24 0415  piperacillin -tazobactam (ZOSYN ) IVPB 2.25 g  Status:  Discontinued        2.25 g 100 mL/hr over 30 Minutes Intravenous Every 8 hours 11/03/24 0319 11/03/24 1903   11/02/24 2130  vancomycin  (VANCOCIN ) IVPB 1000 mg/200  mL premix  Status:  Discontinued        1,000 mg 200 mL/hr over 60 Minutes Intravenous  Once 11/02/24 2122 11/03/24 0725   11/02/24 2030  ceFEPIme  (MAXIPIME ) 2 g in sodium chloride  0.9 % 100 mL IVPB        2 g 200 mL/hr over 30 Minutes Intravenous  Once 11/02/24 2018 11/02/24 2125   11/02/24 2030  metroNIDAZOLE  (FLAGYL ) IVPB 500 mg        500 mg 100 mL/hr over 60 Minutes Intravenous  Once 11/02/24 2018 11/03/24 2000       Assessment/Plan: POD 7 omental patch of perforation of GJ (prior bypass)- PT   -advance bari diet -Change  wound care to once a day for now -anti fungal powder to wound -hgb and plts improving   Vicenta Poli 11/10/2024

## 2024-11-10 NOTE — Progress Notes (Addendum)
 RN changed midline abdominal dressing per the order. Wet -to-dry dressing placed per the order. See picture below:    RN sent a photo via secure chat to Dr. Camellia Blush and Dr Delon Herald

## 2024-11-11 DIAGNOSIS — K631 Perforation of intestine (nontraumatic): Secondary | ICD-10-CM | POA: Diagnosis not present

## 2024-11-11 LAB — CBC
HCT: 30.7 % — ABNORMAL LOW (ref 36.0–46.0)
Hemoglobin: 10.1 g/dL — ABNORMAL LOW (ref 12.0–15.0)
MCH: 33.7 pg (ref 26.0–34.0)
MCHC: 32.9 g/dL (ref 30.0–36.0)
MCV: 102.3 fL — ABNORMAL HIGH (ref 80.0–100.0)
Platelets: 105 K/uL — ABNORMAL LOW (ref 150–400)
RBC: 3 MIL/uL — ABNORMAL LOW (ref 3.87–5.11)
RDW: 15.6 % — ABNORMAL HIGH (ref 11.5–15.5)
WBC: 12.1 K/uL — ABNORMAL HIGH (ref 4.0–10.5)
nRBC: 0 % (ref 0.0–0.2)

## 2024-11-11 MED ORDER — ENOXAPARIN SODIUM 40 MG/0.4ML IJ SOSY
40.0000 mg | PREFILLED_SYRINGE | INTRAMUSCULAR | Status: DC
  Administered 2024-11-11 – 2024-11-14 (×4): 40 mg via SUBCUTANEOUS
  Filled 2024-11-11 (×4): qty 0.4

## 2024-11-11 NOTE — Assessment & Plan Note (Addendum)
 Thrombocytopenia thought to be due to zosyn  and sepsis. HIT less likely, 3 points on 4T scoring No longer on Zosyn  Platelets up  from 23 to >100 Hgb stable Will start Lovenox  for DVT prevention at this time Recheck CBC in AM

## 2024-11-11 NOTE — Plan of Care (Signed)
  Problem: Activity: Goal: Risk for activity intolerance will decrease Outcome: Progressing   Problem: Nutrition: Goal: Adequate nutrition will be maintained Outcome: Progressing   Problem: Coping: Goal: Level of anxiety will decrease Outcome: Progressing   Problem: Pain Managment: Goal: General experience of comfort will improve and/or be controlled Outcome: Progressing   Problem: Skin Integrity: Goal: Risk for impaired skin integrity will decrease Outcome: Progressing

## 2024-11-11 NOTE — Plan of Care (Signed)
   Problem: Activity: Goal: Risk for activity intolerance will decrease Outcome: Progressing   Problem: Pain Managment: Goal: General experience of comfort will improve and/or be controlled Outcome: Progressing   Problem: Safety: Goal: Ability to remain free from injury will improve Outcome: Progressing

## 2024-11-11 NOTE — Assessment & Plan Note (Signed)
-  She does not appear to be taking medications for this issue at this time

## 2024-11-11 NOTE — Assessment & Plan Note (Signed)
 No longer requiring Precedex  PRN haldol  ordered but not needed Resumed Klonopin , Venlafaxine 

## 2024-11-11 NOTE — Assessment & Plan Note (Signed)
 Extubated 12/27, on room air  Started back on O2 for uncertain reasons Will wean back to RA

## 2024-11-11 NOTE — Progress Notes (Signed)
 9 Days Post-Op   Subjective/Chief Complaint: Comfortable this morning Tolerating po   Objective: Vital signs in last 24 hours: Temp:  [98.4 F (36.9 C)-99 F (37.2 C)] 98.9 F (37.2 C) (01/04 0501) Pulse Rate:  [94-98] 97 (01/04 0501) Resp:  [16-19] 19 (01/04 0501) BP: (121-151)/(69-80) 121/69 (01/04 0501) SpO2:  [91 %-99 %] 99 % (01/04 0501) Last BM Date : 11/10/24  Intake/Output from previous day: 01/03 0701 - 01/04 0700 In: 480 [P.O.:480] Out: 885 [Urine:850; Drains:35] Intake/Output this shift: No intake/output data recorded.  Exam: Awake and alert Comfortable Abdomen soft, midline wound about the same, non-tender  Lab Results:  Recent Labs    11/10/24 0322 11/11/24 0356  WBC 9.7 12.1*  HGB 10.3* 10.1*  HCT 31.4* 30.7*  PLT 57* 105*   BMET Recent Labs    11/09/24 0252 11/10/24 0322  NA 143 139  K 3.4* 3.7  CL 109 106  CO2 26 26  GLUCOSE 94 102*  BUN 13 13  CREATININE 0.81 0.76  CALCIUM 8.1* 8.2*   PT/INR No results for input(s): LABPROT, INR in the last 72 hours. ABG No results for input(s): PHART, HCO3 in the last 72 hours.  Invalid input(s): PCO2, PO2  Studies/Results: No results found.  Anti-infectives: Anti-infectives (From admission, onward)    Start     Dose/Rate Route Frequency Ordered Stop   11/07/24 1000  cefTRIAXone  (ROCEPHIN ) 2 g in sodium chloride  0.9 % 100 mL IVPB  Status:  Discontinued        2 g 200 mL/hr over 30 Minutes Intravenous Every 24 hours 11/07/24 0808 11/08/24 0734   11/07/24 1000  metroNIDAZOLE  (FLAGYL ) IVPB 500 mg  Status:  Discontinued        500 mg 100 mL/hr over 60 Minutes Intravenous Every 12 hours 11/07/24 0808 11/08/24 0734   11/05/24 0800  vancomycin  (VANCOREADY) IVPB 750 mg/150 mL  Status:  Discontinued        750 mg 150 mL/hr over 60 Minutes Intravenous Every 48 hours 11/03/24 0725 11/03/24 0751   11/04/24 2200  vancomycin  (VANCOREADY) IVPB 750 mg/150 mL  Status:  Discontinued        750  mg 150 mL/hr over 60 Minutes Intravenous Every 48 hours 11/02/24 2154 11/03/24 0725   11/03/24 2200  piperacillin -tazobactam (ZOSYN ) IVPB 3.375 g  Status:  Discontinued        3.375 g 12.5 mL/hr over 240 Minutes Intravenous Every 8 hours 11/03/24 1903 11/07/24 0807   11/03/24 0845  linezolid  (ZYVOX ) IVPB 600 mg  Status:  Discontinued        600 mg 300 mL/hr over 60 Minutes Intravenous Every 12 hours 11/03/24 0751 11/06/24 0820   11/03/24 0815  vancomycin  (VANCOCIN ) IVPB 1000 mg/200 mL premix  Status:  Discontinued        1,000 mg 200 mL/hr over 60 Minutes Intravenous  Once 11/03/24 0725 11/03/24 0751   11/03/24 0415  metroNIDAZOLE  (FLAGYL ) IVPB 500 mg  Status:  Discontinued        500 mg 100 mL/hr over 60 Minutes Intravenous Every 12 hours 11/03/24 0315 11/03/24 0751   11/03/24 0415  piperacillin -tazobactam (ZOSYN ) IVPB 2.25 g  Status:  Discontinued        2.25 g 100 mL/hr over 30 Minutes Intravenous Every 8 hours 11/03/24 0319 11/03/24 1903   11/02/24 2130  vancomycin  (VANCOCIN ) IVPB 1000 mg/200 mL premix  Status:  Discontinued        1,000 mg 200 mL/hr over 60 Minutes Intravenous  Once 11/02/24 2122 11/03/24 0725   11/02/24 2030  ceFEPIme  (MAXIPIME ) 2 g in sodium chloride  0.9 % 100 mL IVPB        2 g 200 mL/hr over 30 Minutes Intravenous  Once 11/02/24 2018 11/02/24 2125   11/02/24 2030  metroNIDAZOLE  (FLAGYL ) IVPB 500 mg        500 mg 100 mL/hr over 60 Minutes Intravenous  Once 11/02/24 2018 11/03/24 2000       Assessment/Plan:  POD 8 omental patch of perforation of GJ (prior bypass)- PT   -continue current diet -continue wound care -plts back up, hgb stable -working on SNF placement  [employer] 11/11/2024

## 2024-11-11 NOTE — Assessment & Plan Note (Signed)
 No growth on cultures Thought to be due to intraabdominal infection Completed antibiotics x 5 days

## 2024-11-11 NOTE — Progress Notes (Signed)
 Physical Therapy Treatment Patient Details Name: [name] MRN: [id number] DOB: 1946/05/21 Today's Date: 11/11/2024   History of Present Illness Pt is a 79 y.o. female who presented abdominal pain.CT abd showing large amount of free air consistent wiuth bowel perforation.  She was emergently taken to the OR.  She underwent an ex-lap and found to have perforation at the gastrojejunostomy.  She had an omental patch repair.PMH: HTN, tremor, depression, 1 kidney, CKD, severe malnutrition, bypass 2004, spinal cord stimulator 2016, scoliosis    PT Comments  Pt attempts to refuse PT initially. Pt spouse present and  encouraging pt to participate with PT. With heavy, firm encouragement pt was agreeable. Pt overall +2 mod-max assist for bed mobility, STS transfers to stedy for bed>chair transfer. Pt left on maximove pad for return to bed, NT aware. Pt strongly encouraged to be OOB as much as possible. Educated on risks of continued bedrest and immobility.  Bil UEs elevated on pillows, pt positioned in chair to incr trunk/cervical extension as much as possible. SpO2 95% on 3L, HR low 100s at EOS. Plan for SNF remains appropriate. Continue PT in acute setting   If plan is discharge home, recommend the following: Two people to help with walking and/or transfers;Two people to help with bathing/dressing/bathroom;Help with stairs or ramp for entrance   Can travel by private vehicle     No  Equipment Recommendations  None recommended by PT    Recommendations for Other Services       Precautions / Restrictions Precautions Precautions: Fall Precaution/Restrictions Comments: R jp drain, flexiseal; baseline scoliosis Restrictions Weight Bearing Restrictions Per Provider Order: No     Mobility  Bed Mobility Overal bed mobility: Needs Assistance Bed Mobility: Rolling, Sidelying to Sit Rolling: Min assist, Mod assist Sidelying to sit: Mod assist, +2 for safety/equipment       General bed mobility  comments: multi-modal cues to self assist. assist to progress LEs off bed and elevate trunk    Transfers Overall transfer level: Needs assistance Equipment used: Ambulation equipment used Transfers: Sit to/from Stand, Bed to chair/wheelchair/BSC Sit to Stand: Max assist, +2 physical assistance, +2 safety/equipment, From elevated surface           General transfer comment: cues for UE reach to bar of steady to facilitate anterior wt shift. bed pad used to assist with superior wt translation to standing. once insteady pt required multi-modal cues and light min assist for trunk extension, difficulty maintaining trunk extension, pt attempts to flex fwd and lay on bar of stedy Transfer via Lift Equipment: Stedy  Ambulation/Gait               General Gait Details: unable/NT at this time   Optometrist     Tilt Bed    Modified Rankin (Stroke Patients Only)       Balance Overall balance assessment: Needs assistance Sitting-balance support: Feet unsupported, Bilateral upper extremity supported Sitting balance-Leahy Scale: Zero Sitting balance - Comments: head to left (baseline +scoliosis). pt required constant cues to self assist to sitting in midline, leans to L, trying to get back in bed at times. with incr time and heavy encouragement pt was able to maintain static sitting; pt spouse intermittently assisting and encouraging pt to put forth effort Postural control: Left lateral lean, Posterior lean Standing balance support: Bilateral upper extremity supported, During functional activity, Reliant on assistive device for balance Standing balance-Leahy  Scale: Zero                              Communication Communication Communication: Impaired Factors Affecting Communication: Hearing impaired  Cognition Arousal: Alert Behavior During Therapy: WFL for tasks assessed/performed   PT - Cognitive impairments: Orientation    Orientation impairments: Time                     Following commands: Impaired Following commands impaired: Only follows one step commands consistently    Cueing Cueing Techniques: Verbal cues, Tactile cues, Gestural cues  Exercises General Exercises - Lower Extremity Ankle Circles/Pumps: AROM, Both, 5 reps Quad Sets: AROM, Both, 5 reps Heel Slides: AAROM, Both, 5 reps    General Comments        Pertinent Vitals/Pain Pain Assessment Pain Assessment: Faces Faces Pain Scale: Hurts even more Pain Location: abdomen Pain Descriptors / Indicators: Discomfort, Grimacing Pain Intervention(s): Limited activity within patient's tolerance, Monitored during session, Repositioned    Home Living                          Prior Function            PT Goals (current goals can now be found in the care plan section) Acute Rehab PT Goals Patient Stated Goal: go home PT Goal Formulation: With patient/family Time For Goal Achievement: 11/20/24 Potential to Achieve Goals: Fair Progress towards PT goals: Progressing toward goals    Frequency    Min 2X/week      PT Plan      Co-evaluation              AM-PAC PT 6 Clicks Mobility   Outcome Measure  Help needed turning from your back to your side while in a flat bed without using bedrails?: Total Help needed moving from lying on your back to sitting on the side of a flat bed without using bedrails?: Total Help needed moving to and from a bed to a chair (including a wheelchair)?: Total Help needed standing up from a chair using your arms (e.g., wheelchair or bedside chair)?: Total Help needed to walk in hospital room?: Total Help needed climbing 3-5 steps with a railing? : Total 6 Click Score: 6    End of Session Equipment Utilized During Treatment: Gait belt;Other (comment) (stedy) Activity Tolerance: Patient limited by fatigue;Patient limited by pain Patient left: in chair;with call bell/phone  within reach;with chair alarm set Nurse Communication: Mobility status;Need for lift equipment;Other (comment) (no alarm box in room; maximove for back to bed) PT Visit Diagnosis: Difficulty in walking, not elsewhere classified (R26.2);Other abnormalities of gait and mobility (R26.89);Muscle weakness (generalized) (M62.81)     Time: 8798-8766 PT Time Calculation (min) (ACUTE ONLY): 32 min  Charges:    $Therapeutic Activity: 23-37 mins PT General Charges $$ ACUTE PT VISIT: 1 Visit                     Aveena Bari, PT  Acute Rehab Dept [facility]) [phone]  11/11/2024    [facility] 11/11/2024, 2:37 PM

## 2024-11-11 NOTE — Assessment & Plan Note (Signed)
 2017 echo with preserved EF and grade 1 diastolic dysfunction EF also preserved on MPS in 12/2018 Appears compensated

## 2024-11-11 NOTE — Consult Note (Addendum)
 Initial Consultation Note   Patient: [name] FMW:[id number] DOB: June 20, 1946 PCP: Antonio Cyndee Jamee JONELLE, DO DOA: 11/02/2024 DOS: the patient was seen and examined on 11/11/2024 Primary service: Kristopher, Md, MD  Referring physician: Surgery Reason for consult: Medical management   Assessment and Plan:  Assessment & Plan Small bowel perforation (HCC) Perforated gastric ulcer (HCC) S/p ex-lap and omental patch on 12/26 Abdomen closed with R JP drain, minimal drainage today PRN dilaudid  for pain Management per surgery NG tube removed CLD -> fulls -> regular On Protonix  and Carafate  Wound care changed to once daily with addition of fungal powder to wound Acute respiratory failure with hypoxemia (HCC) Extubated 12/27, on room air  Started back on O2 for uncertain reasons Will wean back to RA Sepsis (HCC) No growth on cultures Thought to be due to intraabdominal infection Completed antibiotics x 5 days Delirium due to general medical condition Generalized anxiety disorder No longer requiring Precedex  PRN haldol  ordered but not needed Resumed Klonopin , Venlafaxine  Primary hypertension Prn hydralazine  and labetalol  Resumed amlodipine , losartan  Good control, current BP 138/73 Mild diastolic dysfunction 2017 echo with preserved EF and grade 1 diastolic dysfunction EF also preserved on MPS in 12/2018 Appears compensated Hyperlipidemia She does not appear to be taking medications for this issue at this time  Chronic pain disorder I have reviewed this patient in the [place] Controlled Substances Reporting System.  She is receiving medications from only one provider and appears to be taking them as prescribed. She is not at particularly high risk of opioid misuse, diversion, or overdose.  She appears to be taking oxycodone  and Clonazepam  consistently, Tramadol  less frequently at home Currently controlled with morphine , tizanidine  resumed; will add oxycodone  since she is taking PO PT/OT  consulting, recommending STR Thrombocytopenia ANEMIA, MILD Thrombocytopenia thought to be due to zosyn  and sepsis. HIT less likely, 3 points on 4T scoring No longer on Zosyn  Platelets up  from 23 to >100 Hgb stable Will start Lovenox  for DVT prevention at this time Recheck CBC in AM      TRH will continue to follow the patient.  She is awaiting STR placement.   HPI: [name] is a 79 y.o. female with past medical history of HTN, tremor, solitary kidney, chronic pain with spinal cord stimulator and anxiety d/o who presented on 12/26 with abdominal pain. CT with large free air c/w bowel perforation. She was taken emergently for ex lap and was found to have a perforation at the gastrojejunal junction; omental patch repair performed.   Slow but gradual improvement.    Review of Systems: As mentioned in the history of present illness. All other systems reviewed and are negative. Past Medical History:  Diagnosis Date   Abrasion of right heel    during admission 10/ 2019 right heel open skin due to movement on sheet   ADD (attention deficit disorder)    Anemia, mild    Anxiety    Arthritis    joints, back   At high risk for falls    10-19-2018 pt has fell twice in past week, tripped   Chronic back pain    all over my back (10/02/2013)   Crushing injury of arm, right 02/06/1989's   it was crushed; wore cast from fingers to top of my shoulder (11/25/2   Crushing injury of left wrist 05/11/1989's   DOE (dyspnea on exertion)    Essential tremor    Fibromyalgia    History of palpitations 2002   recurrent  History of panic attacks    History of pneumonia 08/21/2018   w/ acute respiratory failure/ hypoxia   History of sepsis    admission 08-21-2018  secondary to uropathy obstructive from ureteral stone    Hyperlipidemia    Hypertension    10-19-2018 PER PT AMLODIPINE  ON HOLD SINCE 10/ 2019 DUE TO KIDNEY ISSUES PER DOCTOR   Idiopathic scoliosis 04/19/2013   Left ureteral  stone    Migraines    once/month (10/02/2013)   Osteoporosis    Other vitamin B12 deficiency anemia    PONV (postoperative nausea and vomiting)    As a child   Pulmonary nodules/lesions, multiple    Renal insufficiency    S/P gastric bypass 09/16/2003   S/P insertion of spinal cord stimulator    per pt remote is missing   Wears dentures    upper   Wears glasses    Wears glasses    Past Surgical History:  Procedure Laterality Date   ABDOMINAL HYSTERECTOMY  1980's   W/  BSO  AND APPENDECTOMY   CARDIAC CATHETERIZATION  12-13-2002    dr delford   normal coronaries   CYSTOSCOPY WITH URETEROSCOPY AND STENT PLACEMENT Left 09/27/2018   Procedure: LEFT  URETEROSCOPY, HOLMIUM LASER LITHOTRIPSY;  Surgeon: Cam Morene ORN, MD;  Location: [facility];  Service: Urology;  Laterality: Left;   D & C HYSTERSCOPY RESECTION MYOMECTOMY  1980s   HUMERUS FRACTURE SURGERY Left 04/17/2019    Comminuted complex left intra-articular distal humerus fracture/supracondylar humerus fracture displaced   INTRAMEDULLARY (IM) NAIL INTERTROCHANTERIC Right 06/01/2019   Procedure: INTRAMEDULLARY (IM) NAIL INTERTROCHANTRIC;  Surgeon: Melodi Lerner, MD;  Location: [facility];  Service: Orthopedics;  Laterality: Right;   IR NEPHROSTOMY PLACEMENT LEFT  08/22/2018   IR NEPHROSTOMY PLACEMENT LEFT  12/08/2018   KNEE ARTHROSCOPY Right 03-31-2001    dr amy @[facility]    LAPAROTOMY N/A 11/02/2024   Procedure: LAPAROTOMY, EXPLORATORY AND OMENTAL PATCH REPAIR OF PERFORATION OF GASTROJEJUNOSTOMY;  Surgeon: Curvin Deward MOULD, MD;  Location: [facility];  Service: General;  Laterality: N/A;   NEPHROLITHOTOMY Left 10/20/2018   Procedure: LEFT NEPHROLITHOTOMY PERCUTANEOUS;  Surgeon: Cam Morene ORN, MD;  Location: [facility];  Service: Urology;  Laterality: Left;   ORIF HUMERUS FRACTURE Left 04/17/2019   Procedure: Open reduction internal fixation left supracondylar humerus fracture with olecranon osteotomy and ulnar nerve release and anterior  transposition and repair of structures as necessary;  Surgeon: Camella Fallow, MD;  Location: [facility];  Service: Orthopedics;  Laterality: Left;  3 hrs   REDUCTION MAMMAPLASTY Bilateral 2001   ROBOT ASSISTED LAPAROSCOPIC NEPHRECTOMY Left 12/25/2018   Procedure: XI ROBOTIC ASSISTED LAPAROSCOPIC NEPHRECTOMY, LEFT FLANK EXPLORATION WITH REMOVAL OF BATTERY PACK;  Surgeon: Cam Morene ORN, MD;  Location: [facility];  Service: Urology;  Laterality: Left;   ROUX-EN-Y GASTRIC BYPASS  09-16-2003   dr [email or web address]. gladis  @[facility]    via Laparoscopy w/ gastrojejunostomy   SPINAL CORD STIMULATOR IMPLANT  2016   10-19-2018  PER PT HAS REMOTE BUT HAS NOT USED IT SINCE DEC 2018   TONSILLECTOMY AND ADENOIDECTOMY  1955   TUBAL LIGATION  1980's   Social History:  reports that she has never smoked. She has never used smokeless tobacco. She reports that she does not drink alcohol and does not use drugs.  Allergies[1]  Family History  Adopted: Yes  Problem Relation Age of Onset   COPD Mother    Emphysema Mother    Heart attack Mother    Other Mother  tobacco abuse    Prior to Admission medications  Medication Sig Start Date End Date Taking? Authorizing Provider  amLODipine  (NORVASC ) 5 MG tablet 1 po bid 10/22/24  Yes Lowne Chase, [name], DO  clonazePAM  (KLONOPIN ) 0.5 MG tablet Take 1 tablet (0.5 mg total) by mouth 3 (three) times daily as needed. 10/22/24  Yes Antonio Cyndee Rockers R, DO  loperamide  (IMODIUM ) 2 MG capsule TAKE 1 CAPSULE BY MOUTH DAILY AS NEEDED FOR DIARRHEA OR LOOSE STOOLS 07/27/24  Yes Antonio Cyndee Rockers R, DO  losartan  (COZAAR ) 50 MG tablet TAKE 1 TABLET BY MOUTH DAILY 10/16/24  Yes Lowne Chase, [name], DO  ondansetron  (ZOFRAN -ODT) 8 MG disintegrating tablet Take 1 tablet (8 mg total) by mouth every 8 (eight) hours as needed for nausea or vomiting. 05/10/24  Yes Antonio Cyndee Rockers R, DO  oxyCODONE  (ROXICODONE ) 15 MG immediate release tablet Take 1 tablet (15 mg total) by mouth 4 (four) times  daily. Patient taking differently: Take 15 mg by mouth See admin instructions. Take 15 mg by mouth every four to six hours 04/19/19  Yes Camella Fallow, MD  SUMAtriptan  (IMITREX ) 50 MG tablet TAKE 1 TABLET BY MOUTH AT ONSET OF MIGRAINE; MAY REPEAT 1 TABLET IN 2 HOURS IF NEEDED. 10/22/24  Yes Antonio Cyndee Rockers R, DO  tiZANidine  (ZANAFLEX ) 4 MG tablet Take 4 mg by mouth at bedtime. 07/05/24  Yes [provider]  traMADol  (ULTRAM ) 50 MG tablet Take 50 mg by mouth 2 (two) times daily as needed (for pain).   Yes [provider]  TYLENOL  500 MG tablet Take 500-1,000 mg by mouth every 6 (six) hours as needed for mild pain (pain score 1-3) (or headaches).   Yes [provider]  venlafaxine  XR (EFFEXOR -XR) 75 MG 24 hr capsule Take 2 capsules (150 mg total) by mouth daily. 08/20/24  Yes Antonio Cyndee Rockers JONELLE, DO    Physical Exam: Vitals:   11/10/24 0536 11/10/24 1844 11/10/24 2230 11/11/24 0501  BP:  (!) 146/76 (!) 151/80 121/69  Pulse:  94 98 97  Resp:  16 17 19   Temp:  98.4 F (36.9 C) 99 F (37.2 C) 98.9 F (37.2 C)  TempSrc:  Oral Oral Oral  SpO2:  94% 91% 99%  Weight: 58.8 kg     Height:          Intake/Output Summary (Last 24 hours) at 11/11/2024 0857 Last data filed at 11/11/2024 0600 Gross per 24 hour  Intake 480 ml  Output 885 ml  Net -405 ml   Filed Weights   11/09/24 0702 11/10/24 0536  Weight: 60.7 kg 58.8 kg    Exam:  General:  Appears calm and comfortable and is in NAD; appears frail, less engaged today Eyes:  normal lids, iris ENT:  grossly normal hearing, lips & tongue, mmm Cardiovascular:  RRR. No LE edema.  Respiratory:   CTA bilaterally with no wheezes/rales/rhonchi.  Normal respiratory effort. Abdomen:  bandage with scant serous drainage today; drain with scant output Skin:  no rash or induration seen on limited exam Musculoskeletal:  generalized weakness, no bony abnormality Psychiatric:  blunted mood and affect, speech fluent and  appropriate, AOx3 Neurologic:  CN 2-12 grossly intact, generalized weakness, leaning leftward  Data Reviewed: I have reviewed the patient's lab results since admission.  Pertinent labs for today include:   WBC 12.1 Hgb 10.1 Platelets 105    Family Communication: None present  Thank you very much for involving us  in the care of  your patient.  Author: Delon Herald, MD 11/11/2024 8:56 AM  For on call review [email or web address].      [1]  Allergies Allergen Reactions   Zanaflex  [Tizanidine ] Other (See Comments)    Hallucinations only with higher doses- otherwise, it is tolerated    Adhesive [Tape] Itching   Latex Itching and Rash

## 2024-11-11 NOTE — Assessment & Plan Note (Signed)
 I have reviewed this patient in the [place] Controlled Substances Reporting System.  She is receiving medications from only one provider and appears to be taking them as prescribed. She is not at particularly high risk of opioid misuse, diversion, or overdose.  She appears to be taking oxycodone  and Clonazepam  consistently, Tramadol  less frequently at home Currently controlled with morphine , tizanidine  resumed; will add oxycodone  since she is taking PO PT/OT consulting, recommending STR

## 2024-11-11 NOTE — Assessment & Plan Note (Signed)
 S/p ex-lap and omental patch on 12/26 Abdomen closed with R JP drain, minimal drainage today PRN dilaudid  for pain Management per surgery NG tube removed CLD -> fulls -> regular On Protonix  and Carafate  Wound care changed to once daily with addition of fungal powder to wound

## 2024-11-11 NOTE — Assessment & Plan Note (Signed)
 Prn hydralazine  and labetalol  Resumed amlodipine , losartan  Good control, current BP 138/73

## 2024-11-12 DIAGNOSIS — K631 Perforation of intestine (nontraumatic): Secondary | ICD-10-CM | POA: Diagnosis not present

## 2024-11-12 LAB — CBC
HCT: 32.2 % — ABNORMAL LOW (ref 36.0–46.0)
Hemoglobin: 10.6 g/dL — ABNORMAL LOW (ref 12.0–15.0)
MCH: 34 pg (ref 26.0–34.0)
MCHC: 32.9 g/dL (ref 30.0–36.0)
MCV: 103.2 fL — ABNORMAL HIGH (ref 80.0–100.0)
Platelets: 193 K/uL (ref 150–400)
RBC: 3.12 MIL/uL — ABNORMAL LOW (ref 3.87–5.11)
RDW: 15.5 % (ref 11.5–15.5)
WBC: 11.3 K/uL — ABNORMAL HIGH (ref 4.0–10.5)
nRBC: 0 % (ref 0.0–0.2)

## 2024-11-12 MED ORDER — NYSTATIN 100000 UNIT/GM EX POWD
Freq: Two times a day (BID) | CUTANEOUS | Status: DC
  Filled 2024-11-12: qty 15

## 2024-11-12 NOTE — Consult Note (Signed)
 Initial Consultation Note   Patient: [name] FMW:[id number] DOB: 05/25/1946 PCP: Molly Cyndee Jamee JONELLE, DO DOA: 11/02/2024 DOS: the patient was seen and examined on 11/12/2024 Primary service: Kristopher, Md, MD  Referring physician: Surgery Reason for consult: Medical management   Assessment and Plan:  Assessment & Plan Small bowel perforation (HCC) Perforated gastric ulcer (HCC) S/p ex-lap and omental patch on 12/26 Abdomen closed with R JP drain, minimal drainage today PRN dilaudid  for pain Management per surgery NG tube removed CLD -> fulls -> regular On Protonix  and Carafate  Wound care changed to once daily with addition of fungal powder to wound Acute respiratory failure with hypoxemia (HCC) Extubated 12/27, on room air  Started back on O2 for uncertain reasons Will wean back to RA Sepsis (HCC) No growth on cultures Thought to be due to intraabdominal infection Completed antibiotics x 5 days Delirium due to general medical condition Generalized anxiety disorder No longer requiring Precedex  PRN haldol  ordered but not needed Resumed Klonopin , Venlafaxine  Primary hypertension Prn hydralazine  and labetalol  Resumed amlodipine , losartan  Good control, current BP 124/83 Mild diastolic dysfunction 2017 echo with preserved EF and grade 1 diastolic dysfunction EF also preserved on MPS in 12/2018 Appears compensated Hyperlipidemia She does not appear to be taking medications for this issue at this time  Chronic pain disorder I have reviewed this patient in the [place] Controlled Substances Reporting System.  She is receiving medications from only one provider and appears to be taking them as prescribed. She is not at particularly high risk of opioid misuse, diversion, or overdose.  She appears to be taking oxycodone  and Clonazepam  consistently, Tramadol  less frequently at home Currently controlled with morphine , tizanidine  resumed; will add oxycodone  since she is taking PO PT/OT  consulting, recommending STR Thrombocytopenia ANEMIA, MILD Thrombocytopenia thought to be due to zosyn  and sepsis. HIT less likely, 3 points on 4T scoring No longer on Zosyn  Platelets up  from 23 to >100 Hgb stable Will start Lovenox  for DVT prevention at this time Recheck CBC in AM      [facility] will continue to follow the patient.  HPI: [name] is a 79 y.o. female with past medical history of HTN, tremor, solitary kidney, chronic pain with spinal cord stimulator and anxiety d/o who presented on 12/26 with abdominal pain. CT with large free air c/w bowel perforation. She was taken emergently for ex lap and was found to have a perforation at the gastrojejunal junction; omental patch repair performed.   Slow but gradual improvement.    Review of Systems: As mentioned in the history of present illness. All other systems reviewed and are negative. Past Medical History:  Diagnosis Date   Abrasion of right heel    during admission 10/ 2019 right heel open skin due to movement on sheet   ADD (attention deficit disorder)    Anemia, mild    Anxiety    Arthritis    joints, back   At high risk for falls    10-19-2018 pt has fell twice in past week, tripped   Chronic back pain    all over my back (10/02/2013)   Crushing injury of arm, right 02/06/1989's   it was crushed; wore cast from fingers to top of my shoulder (11/25/2   Crushing injury of left wrist 05/11/1989's   DOE (dyspnea on exertion)    Essential tremor    Fibromyalgia    History of palpitations 2002   recurrent   History of panic attacks  History of pneumonia 08/21/2018   w/ acute respiratory failure/ hypoxia   History of sepsis    admission 08-21-2018  secondary to uropathy obstructive from ureteral stone    Hyperlipidemia    Hypertension    10-19-2018 PER PT AMLODIPINE  ON HOLD SINCE 10/ 2019 DUE TO KIDNEY ISSUES PER DOCTOR   Idiopathic scoliosis 04/19/2013   Left ureteral stone    Migraines    once/month  (10/02/2013)   Osteoporosis    Other vitamin B12 deficiency anemia    PONV (postoperative nausea and vomiting)    As a child   Pulmonary nodules/lesions, multiple    Renal insufficiency    S/P gastric bypass 09/16/2003   S/P insertion of spinal cord stimulator    per pt remote is missing   Wears dentures    upper   Wears glasses    Wears glasses    Past Surgical History:  Procedure Laterality Date   ABDOMINAL HYSTERECTOMY  1980's   W/  BSO  AND APPENDECTOMY   CARDIAC CATHETERIZATION  12-13-2002    dr [name]   normal coronaries   CYSTOSCOPY WITH URETEROSCOPY AND STENT PLACEMENT Left 09/27/2018   Procedure: LEFT  URETEROSCOPY, HOLMIUM LASER LITHOTRIPSY;  Surgeon: Molly Morene ORN, MD;  Location: [facility];  Service: Urology;  Laterality: Left;   D & C HYSTERSCOPY RESECTION MYOMECTOMY  1980s   HUMERUS FRACTURE SURGERY Left 04/17/2019    Comminuted complex left intra-articular distal humerus fracture/supracondylar humerus fracture displaced   INTRAMEDULLARY (IM) NAIL INTERTROCHANTERIC Right 06/01/2019   Procedure: INTRAMEDULLARY (IM) NAIL INTERTROCHANTRIC;  Surgeon: Molly Lerner, MD;  Location: [facility];  Service: Orthopedics;  Laterality: Right;   IR NEPHROSTOMY PLACEMENT LEFT  08/22/2018   IR NEPHROSTOMY PLACEMENT LEFT  12/08/2018   KNEE ARTHROSCOPY Right 03-31-2001    dr [name] @[facility]    LAPAROTOMY N/A 11/02/2024   Procedure: LAPAROTOMY, EXPLORATORY AND OMENTAL PATCH REPAIR OF PERFORATION OF GASTROJEJUNOSTOMY;  Surgeon: Molly Deward MOULD, MD;  Location: [facility];  Service: General;  Laterality: N/A;   NEPHROLITHOTOMY Left 10/20/2018   Procedure: LEFT NEPHROLITHOTOMY PERCUTANEOUS;  Surgeon: Molly Morene ORN, MD;  Location: [facility];  Service: Urology;  Laterality: Left;   ORIF HUMERUS FRACTURE Left 04/17/2019   Procedure: Open reduction internal fixation left supracondylar humerus fracture with olecranon osteotomy and ulnar nerve release and anterior transposition and repair of  structures as necessary;  Surgeon: Molly Fallow, MD;  Location: [facility];  Service: Orthopedics;  Laterality: Left;  3 hrs   REDUCTION MAMMAPLASTY Bilateral 2001   ROBOT ASSISTED LAPAROSCOPIC NEPHRECTOMY Left 12/25/2018   Procedure: XI ROBOTIC ASSISTED LAPAROSCOPIC NEPHRECTOMY, LEFT FLANK EXPLORATION WITH REMOVAL OF BATTERY PACK;  Surgeon: Molly Morene ORN, MD;  Location: [facility];  Service: Urology;  Laterality: Left;   ROUX-EN-Y GASTRIC BYPASS  09-16-2003   dr [email or web address]. [email or web address]  @[email or web address]    via Laparoscopy w/ gastrojejunostomy   SPINAL CORD STIMULATOR IMPLANT  2016   10-19-2018  PER PT HAS REMOTE BUT HAS NOT USED IT SINCE DEC 2018   TONSILLECTOMY AND ADENOIDECTOMY  1955   TUBAL LIGATION  1980's   Social History:  reports that she has never smoked. She has never used smokeless tobacco. She reports that she does not drink alcohol and does not use drugs.  Allergies[1]  Family History  Adopted: Yes  Problem Relation Age of Onset   COPD Mother    Emphysema Mother    Heart attack Mother    Other Mother  tobacco abuse    Prior to Admission medications  Medication Sig Start Date End Date Taking? Authorizing Provider  amLODipine  (NORVASC ) 5 MG tablet 1 po bid 10/22/24  Yes Lowne Chase, [name], DO  clonazePAM  (KLONOPIN ) 0.5 MG tablet Take 1 tablet (0.5 mg total) by mouth 3 (three) times daily as needed. 10/22/24  Yes Molly Cyndee Rockers R, DO  loperamide  (IMODIUM ) 2 MG capsule TAKE 1 CAPSULE BY MOUTH DAILY AS NEEDED FOR DIARRHEA OR LOOSE STOOLS 07/27/24  Yes Molly Cyndee Rockers R, DO  losartan  (COZAAR ) 50 MG tablet TAKE 1 TABLET BY MOUTH DAILY 10/16/24  Yes Lowne Chase, [name], DO  ondansetron  (ZOFRAN -ODT) 8 MG disintegrating tablet Take 1 tablet (8 mg total) by mouth every 8 (eight) hours as needed for nausea or vomiting. 05/10/24  Yes Molly Cyndee Rockers JONELLE, DO  oxyCODONE  (ROXICODONE ) 15 MG immediate release tablet Take 1 tablet (15 mg total) by mouth 4 (four) times daily. Patient taking  differently: Take 15 mg by mouth See admin instructions. Take 15 mg by mouth every four to six hours 04/19/19  Yes Molly Fallow, MD  SUMAtriptan  (IMITREX ) 50 MG tablet TAKE 1 TABLET BY MOUTH AT ONSET OF MIGRAINE; MAY REPEAT 1 TABLET IN 2 HOURS IF NEEDED. 10/22/24  Yes Molly Cyndee Rockers R, DO  tiZANidine  (ZANAFLEX ) 4 MG tablet Take 4 mg by mouth at bedtime. 07/05/24  Yes [provider]  traMADol  (ULTRAM ) 50 MG tablet Take 50 mg by mouth 2 (two) times daily as needed (for pain).   Yes [provider]  TYLENOL  500 MG tablet Take 500-1,000 mg by mouth every 6 (six) hours as needed for mild pain (pain score 1-3) (or headaches).   Yes [provider]  venlafaxine  XR (EFFEXOR -XR) 75 MG 24 hr capsule Take 2 capsules (150 mg total) by mouth daily. 08/20/24  Yes Molly Cyndee Rockers JONELLE, DO    Physical Exam: Vitals:   11/11/24 0501 11/11/24 1405 11/11/24 2120 11/12/24 0539  BP: 121/69 131/77 (!) 143/84 137/83  Pulse: 97 96 94 93  Resp: 19 17 17 18   Temp: 98.9 F (37.2 C) 97.9 F (36.6 C) 98.1 F (36.7 C) 98.7 F (37.1 C)  TempSrc: Oral Oral Oral Oral  SpO2: 99% 100% 99% 98%  Weight:      Height:         Subjective: Feeling ok.  Pain is about 8/10.  Eating  ok.  Understands and agrees with plan for STR.  Rectal tube is in place.     Intake/Output Summary (Last 24 hours) at 11/12/2024 0646 Last data filed at 11/12/2024 0539 Gross per 24 hour  Intake 365 ml  Output 680 ml  Net -315 ml   Filed Weights   11/09/24 0702 11/10/24 0536  Weight: 60.7 kg 58.8 kg    Exam:  General:  Appears calm and comfortable and is in NAD Eyes:  normal lids, iris ENT:  grossly normal hearing, lips & tongue, mmm Cardiovascular:  RRR, no m/r/g. No LE edema.  Respiratory:   CTA bilaterally with no wheezes/rales/rhonchi.  Normal respiratory effort. Abdomen:  bandage is C/D/I; drain with scant drainage; rectal tube in place Skin:  no rash or induration seen on limited  exam Musculoskeletal:  generalized weakness, no bony abnormality Psychiatric: blunted mood and affect, speech fluent and appropriate, AOx3 Neurologic:  CN 2-12 grossly intact, moves all extremities in coordinated fashion  Data Reviewed: I have reviewed the patient's lab results since admission.  Pertinent labs for  today include:  WBC 11.3 Hgb 10.6, stable Platelets 193    Family Communication: Daughter was present  Thank you very much for involving us  in the care of your patient.  Author: Delon Herald, MD 11/12/2024 6:45 AM  For on call review [email or web address].      [1]  Allergies Allergen Reactions   Zanaflex  [Tizanidine ] Other (See Comments)    Hallucinations only with higher doses- otherwise, it is tolerated    Adhesive [Tape] Itching   Latex Itching and Rash

## 2024-11-12 NOTE — Assessment & Plan Note (Signed)
 Thrombocytopenia thought to be due to zosyn  and sepsis. HIT less likely, 3 points on 4T scoring No longer on Zosyn  Platelets up  from 23 to >100 Hgb stable Will start Lovenox  for DVT prevention at this time Recheck CBC in AM

## 2024-11-12 NOTE — Assessment & Plan Note (Addendum)
 Prn hydralazine  and labetalol  Resumed amlodipine , losartan  Good control, current BP 124/83

## 2024-11-12 NOTE — Assessment & Plan Note (Signed)
 Extubated 12/27, on room air  Started back on O2 for uncertain reasons Will wean back to RA

## 2024-11-12 NOTE — Assessment & Plan Note (Signed)
 I have reviewed this patient in the [place] Controlled Substances Reporting System.  She is receiving medications from only one provider and appears to be taking them as prescribed. She is not at particularly high risk of opioid misuse, diversion, or overdose.  She appears to be taking oxycodone  and Clonazepam  consistently, Tramadol  less frequently at home Currently controlled with morphine , tizanidine  resumed; will add oxycodone  since she is taking PO PT/OT consulting, recommending STR

## 2024-11-12 NOTE — Assessment & Plan Note (Signed)
 2017 echo with preserved EF and grade 1 diastolic dysfunction EF also preserved on MPS in 12/2018 Appears compensated

## 2024-11-12 NOTE — Assessment & Plan Note (Signed)
 No growth on cultures Thought to be due to intraabdominal infection Completed antibiotics x 5 days

## 2024-11-12 NOTE — Assessment & Plan Note (Signed)
 S/p ex-lap and omental patch on 12/26 Abdomen closed with R JP drain, minimal drainage today PRN dilaudid  for pain Management per surgery NG tube removed CLD -> fulls -> regular On Protonix  and Carafate  Wound care changed to once daily with addition of fungal powder to wound

## 2024-11-12 NOTE — Progress Notes (Signed)
 10 Days Post-Op   Subjective/Chief Complaint: Comfortable this morning Tolerating po.  No concerns.  Has flexi-seal in place   Objective: Vital signs in last 24 hours: Temp:  [97.9 F (36.6 C)-98.7 F (37.1 C)] 98.7 F (37.1 C) (01/05 0539) Pulse Rate:  [93-96] 93 (01/05 0539) Resp:  [17-18] 18 (01/05 0539) BP: (131-143)/(77-84) 137/83 (01/05 0539) SpO2:  [98 %-100 %] 98 % (01/05 0539) Weight:  [64.1 kg] 64.1 kg (01/05 0700) Last BM Date : 11/11/24  Intake/Output from previous day: 01/04 0701 - 01/05 0700 In: 485 [P.O.:485] Out: 685 [Urine:350; Drains:10; Stool:325] Intake/Output this shift: No intake/output data recorded.  Exam: Awake and alert Comfortable Abdomen soft, midline wound about the same, clean, non-tender, JP with serous output, flexi-seal in place.  Some yeast looking skin changes around her wound  Lab Results:  Recent Labs    11/11/24 0356 11/12/24 0648  WBC 12.1* 11.3*  HGB 10.1* 10.6*  HCT 30.7* 32.2*  PLT 105* 193   BMET Recent Labs    11/10/24 0322  NA 139  K 3.7  CL 106  CO2 26  GLUCOSE 102*  BUN 13  CREATININE 0.76  CALCIUM 8.2*   PT/INR No results for input(s): LABPROT, INR in the last 72 hours. ABG No results for input(s): PHART, HCO3 in the last 72 hours.  Invalid input(s): PCO2, PO2  Studies/Results: No results found.  Anti-infectives: Anti-infectives (From admission, onward)    Start     Dose/Rate Route Frequency Ordered Stop   11/07/24 1000  cefTRIAXone  (ROCEPHIN ) 2 g in sodium chloride  0.9 % 100 mL IVPB  Status:  Discontinued        2 g 200 mL/hr over 30 Minutes Intravenous Every 24 hours 11/07/24 0808 11/08/24 0734   11/07/24 1000  metroNIDAZOLE  (FLAGYL ) IVPB 500 mg  Status:  Discontinued        500 mg 100 mL/hr over 60 Minutes Intravenous Every 12 hours 11/07/24 0808 11/08/24 0734   11/05/24 0800  vancomycin  (VANCOREADY) IVPB 750 mg/150 mL  Status:  Discontinued        750 mg 150 mL/hr over 60  Minutes Intravenous Every 48 hours 11/03/24 0725 11/03/24 0751   11/04/24 2200  vancomycin  (VANCOREADY) IVPB 750 mg/150 mL  Status:  Discontinued        750 mg 150 mL/hr over 60 Minutes Intravenous Every 48 hours 11/02/24 2154 11/03/24 0725   11/03/24 2200  piperacillin -tazobactam (ZOSYN ) IVPB 3.375 g  Status:  Discontinued        3.375 g 12.5 mL/hr over 240 Minutes Intravenous Every 8 hours 11/03/24 1903 11/07/24 0807   11/03/24 0845  linezolid  (ZYVOX ) IVPB 600 mg  Status:  Discontinued        600 mg 300 mL/hr over 60 Minutes Intravenous Every 12 hours 11/03/24 0751 11/06/24 0820   11/03/24 0815  vancomycin  (VANCOCIN ) IVPB 1000 mg/200 mL premix  Status:  Discontinued        1,000 mg 200 mL/hr over 60 Minutes Intravenous  Once 11/03/24 0725 11/03/24 0751   11/03/24 0415  metroNIDAZOLE  (FLAGYL ) IVPB 500 mg  Status:  Discontinued        500 mg 100 mL/hr over 60 Minutes Intravenous Every 12 hours 11/03/24 0315 11/03/24 0751   11/03/24 0415  piperacillin -tazobactam (ZOSYN ) IVPB 2.25 g  Status:  Discontinued        2.25 g 100 mL/hr over 30 Minutes Intravenous Every 8 hours 11/03/24 0319 11/03/24 1903   11/02/24 2130  vancomycin  (VANCOCIN )  IVPB 1000 mg/200 mL premix  Status:  Discontinued        1,000 mg 200 mL/hr over 60 Minutes Intravenous  Once 11/02/24 2122 11/03/24 0725   11/02/24 2030  ceFEPIme  (MAXIPIME ) 2 g in sodium chloride  0.9 % 100 mL IVPB        2 g 200 mL/hr over 30 Minutes Intravenous  Once 11/02/24 2018 11/02/24 2125   11/02/24 2030  metroNIDAZOLE  (FLAGYL ) IVPB 500 mg        500 mg 100 mL/hr over 60 Minutes Intravenous  Once 11/02/24 2018 11/03/24 2000       Assessment/Plan: POD 10, s/p ex lap omental patch of perforation of GJ (prior bypass)- PT   -continue current diet -continue wound care, add nystatin  powder around wound -plts back up, hgb stable -DC flexi-seal -can also likely DC JP drain -cont carafate  and protonix  -working on SNF placement  [employer] 11/12/2024

## 2024-11-12 NOTE — Progress Notes (Signed)
 Mobility Specialist - Progress Note   11/12/24 1214  Mobility  Activity Pivoted/transferred from bed to chair  Level of Assistance +2 (takes two people)  Assistive Device Other (Comment) (HHA)  Range of Motion/Exercises Active  Activity Response Tolerated fair  Mobility visit 1 Mobility  Mobility Specialist Start Time (ACUTE ONLY) 1200  Mobility Specialist Stop Time (ACUTE ONLY) 1214  Mobility Specialist Time Calculation (min) (ACUTE ONLY) 14 min   Pt was found in bed and agreeable to mobilize after pain medication administered. Pt requested to pivot to recliner rather then using stedy. NT assisted with transfer. At EOS was left on recliner chair with all needs met. Call bell in reach and family in room.   Erminio Leos,  Mobility Specialist Can be reached via Secure Chat

## 2024-11-12 NOTE — Assessment & Plan Note (Signed)
 No longer requiring Precedex  PRN haldol  ordered but not needed Resumed Klonopin , Venlafaxine 

## 2024-11-12 NOTE — Assessment & Plan Note (Signed)
-  She does not appear to be taking medications for this issue at this time

## 2024-11-13 LAB — CBC
HCT: 29.8 % — ABNORMAL LOW (ref 36.0–46.0)
Hemoglobin: 9.6 g/dL — ABNORMAL LOW (ref 12.0–15.0)
MCH: 33.7 pg (ref 26.0–34.0)
MCHC: 32.2 g/dL (ref 30.0–36.0)
MCV: 104.6 fL — ABNORMAL HIGH (ref 80.0–100.0)
Platelets: 233 K/uL (ref 150–400)
RBC: 2.85 MIL/uL — ABNORMAL LOW (ref 3.87–5.11)
RDW: 15.6 % — ABNORMAL HIGH (ref 11.5–15.5)
WBC: 8 K/uL (ref 4.0–10.5)
nRBC: 0 % (ref 0.0–0.2)

## 2024-11-13 NOTE — Assessment & Plan Note (Signed)
 No growth on cultures Thought to be due to intraabdominal infection Completed antibiotics x 5 days

## 2024-11-13 NOTE — Assessment & Plan Note (Signed)
 2017 echo with preserved EF and grade 1 diastolic dysfunction EF also preserved on MPS in 12/2018 Appears compensated

## 2024-11-13 NOTE — Consult Note (Signed)
 Initial Consultation Note   Patient: [name] FMW:[id number] DOB: October 17, 1946 PCP: Antonio Cyndee Jamee JONELLE, DO DOA: 11/02/2024 DOS: the patient was seen and examined on 11/13/2024 Primary service: Kristopher, Md, MD  Referring physician: Surgery Reason for consult: Medical management   Assessment and Plan:  Assessment & Plan Small bowel perforation (HCC) Perforated gastric ulcer (HCC) S/p ex-lap and omental patch on 12/26 Abdomen closed with R JP drain, minimal drainage today PRN dilaudid  for pain Management per surgery NG tube removed CLD -> fulls -> regular On Protonix  and Carafate  Wound care changed to once daily with addition of fungal powder to wound Acute respiratory failure with hypoxemia (HCC) Extubated 12/27, on room air  Started back on O2 for nocturnal hypoxia (89% overnight on 1/6) Will wean back to RA as tolerated Sepsis (HCC) No growth on cultures Thought to be due to intraabdominal infection Completed antibiotics x 5 days Delirium due to general medical condition Generalized anxiety disorder No longer requiring Precedex  PRN haldol  ordered but not needed Resumed Klonopin , Venlafaxine  Primary hypertension Prn hydralazine  and labetalol  Resumed amlodipine , losartan  Good control, current BP 124/83 Mild diastolic dysfunction 2017 echo with preserved EF and grade 1 diastolic dysfunction EF also preserved on MPS in 12/2018 Appears compensated Hyperlipidemia She does not appear to be taking medications for this issue at this time  Chronic pain disorder I have reviewed this patient in the [place] Controlled Substances Reporting System.  She is receiving medications from only one provider and appears to be taking them as prescribed. She is not at particularly high risk of opioid misuse, diversion, or overdose.  She appears to be taking oxycodone  and Clonazepam  consistently, Tramadol  less frequently at home Currently controlled with morphine , tizanidine  resumed; will add  oxycodone  since she is taking PO PT/OT consulting, recommending STR She is approved at [facility] and is awaiting insurance authorization Thrombocytopenia ANEMIA, MILD Thrombocytopenia thought to be due to zosyn  and sepsis. HIT less likely, 3 points on 4T scoring No longer on Zosyn  Hgb stable Started Lovenox  for DVT prevention Platelets have normalized      [facility] will sign off at present, please call us  again when needed.  HPI: [name] is a 79 y.o. female with past medical history of HTN, tremor, solitary kidney, chronic pain with spinal cord stimulator and anxiety d/o who presented on 12/26 with abdominal pain. CT with large free air c/w bowel perforation. She was taken emergently for ex lap and was found to have a perforation at the gastrojejunal junction; omental patch repair performed.   Slow but gradual improvement.    Review of Systems: As mentioned in the history of present illness. All other systems reviewed and are negative. Past Medical History:  Diagnosis Date   Abrasion of right heel    during admission 10/ 2019 right heel open skin due to movement on sheet   ADD (attention deficit disorder)    Anemia, mild    Anxiety    Arthritis    joints, back   At high risk for falls    10-19-2018 pt has fell twice in past week, tripped   Chronic back pain    all over my back (10/02/2013)   Crushing injury of arm, right 02/06/1989's   it was crushed; wore cast from fingers to top of my shoulder (11/25/2   Crushing injury of left wrist 05/11/1989's   DOE (dyspnea on exertion)    Essential tremor    Fibromyalgia    History of palpitations 2002  recurrent   History of panic attacks    History of pneumonia 08/21/2018   w/ acute respiratory failure/ hypoxia   History of sepsis    admission 08-21-2018  secondary to uropathy obstructive from ureteral stone    Hyperlipidemia    Hypertension    10-19-2018 PER PT AMLODIPINE  ON HOLD SINCE 10/ 2019 DUE TO KIDNEY  ISSUES PER DOCTOR   Idiopathic scoliosis 04/19/2013   Left ureteral stone    Migraines    once/month (10/02/2013)   Osteoporosis    Other vitamin B12 deficiency anemia    PONV (postoperative nausea and vomiting)    As a child   Pulmonary nodules/lesions, multiple    Renal insufficiency    S/P gastric bypass 09/16/2003   S/P insertion of spinal cord stimulator    per pt remote is missing   Wears dentures    upper   Wears glasses    Wears glasses    Past Surgical History:  Procedure Laterality Date   ABDOMINAL HYSTERECTOMY  1980's   W/  BSO  AND APPENDECTOMY   CARDIAC CATHETERIZATION  12-13-2002    dr delford   normal coronaries   CYSTOSCOPY WITH URETEROSCOPY AND STENT PLACEMENT Left 09/27/2018   Procedure: LEFT  URETEROSCOPY, HOLMIUM LASER LITHOTRIPSY;  Surgeon: Cam Morene ORN, MD;  Location: [facility];  Service: Urology;  Laterality: Left;   D & C HYSTERSCOPY RESECTION MYOMECTOMY  1980s   HUMERUS FRACTURE SURGERY Left 04/17/2019    Comminuted complex left intra-articular distal humerus fracture/supracondylar humerus fracture displaced   INTRAMEDULLARY (IM) NAIL INTERTROCHANTERIC Right 06/01/2019   Procedure: INTRAMEDULLARY (IM) NAIL INTERTROCHANTRIC;  Surgeon: Melodi Lerner, MD;  Location: [facility];  Service: Orthopedics;  Laterality: Right;   IR NEPHROSTOMY PLACEMENT LEFT  08/22/2018   IR NEPHROSTOMY PLACEMENT LEFT  12/08/2018   KNEE ARTHROSCOPY Right 03-31-2001    dr amy @[facility]    LAPAROTOMY N/A 11/02/2024   Procedure: LAPAROTOMY, EXPLORATORY AND OMENTAL PATCH REPAIR OF PERFORATION OF GASTROJEJUNOSTOMY;  Surgeon: Curvin Deward MOULD, MD;  Location: [facility];  Service: General;  Laterality: N/A;   NEPHROLITHOTOMY Left 10/20/2018   Procedure: LEFT NEPHROLITHOTOMY PERCUTANEOUS;  Surgeon: Cam Morene ORN, MD;  Location: [facility];  Service: Urology;  Laterality: Left;   ORIF HUMERUS FRACTURE Left 04/17/2019   Procedure: Open reduction internal fixation left supracondylar humerus  fracture with olecranon osteotomy and ulnar nerve release and anterior transposition and repair of structures as necessary;  Surgeon: Camella Fallow, MD;  Location: [facility];  Service: Orthopedics;  Laterality: Left;  3 hrs   REDUCTION MAMMAPLASTY Bilateral 2001   ROBOT ASSISTED LAPAROSCOPIC NEPHRECTOMY Left 12/25/2018   Procedure: XI ROBOTIC ASSISTED LAPAROSCOPIC NEPHRECTOMY, LEFT FLANK EXPLORATION WITH REMOVAL OF BATTERY PACK;  Surgeon: Cam Morene ORN, MD;  Location: [facility];  Service: Urology;  Laterality: Left;   ROUX-EN-Y GASTRIC BYPASS  09-16-2003   dr [email or web address]. [email or web address]  @[facility]    via Laparoscopy w/ gastrojejunostomy   SPINAL CORD STIMULATOR IMPLANT  2016   10-19-2018  PER PT HAS REMOTE BUT HAS NOT USED IT SINCE DEC 2018   TONSILLECTOMY AND ADENOIDECTOMY  1955   TUBAL LIGATION  1980's   Social History:  reports that she has never smoked. She has never used smokeless tobacco. She reports that she does not drink alcohol and does not use drugs.  Allergies[1]  Family History  Adopted: Yes  Problem Relation Age of Onset   COPD Mother    Emphysema Mother    Heart attack Mother  Other Mother        tobacco abuse    Prior to Admission medications  Medication Sig Start Date End Date Taking? Authorizing Provider  amLODipine  (NORVASC ) 5 MG tablet 1 po bid 10/22/24  Yes Lowne Chase, [name], DO  clonazePAM  (KLONOPIN ) 0.5 MG tablet Take 1 tablet (0.5 mg total) by mouth 3 (three) times daily as needed. 10/22/24  Yes Antonio Cyndee Rockers R, DO  loperamide  (IMODIUM ) 2 MG capsule TAKE 1 CAPSULE BY MOUTH DAILY AS NEEDED FOR DIARRHEA OR LOOSE STOOLS 07/27/24  Yes Antonio Cyndee Rockers R, DO  losartan  (COZAAR ) 50 MG tablet TAKE 1 TABLET BY MOUTH DAILY 10/16/24  Yes Lowne Chase, [name], DO  ondansetron  (ZOFRAN -ODT) 8 MG disintegrating tablet Take 1 tablet (8 mg total) by mouth every 8 (eight) hours as needed for nausea or vomiting. 05/10/24  Yes Antonio Cyndee Rockers R, DO  oxyCODONE  (ROXICODONE ) 15 MG  immediate release tablet Take 1 tablet (15 mg total) by mouth 4 (four) times daily. Patient taking differently: Take 15 mg by mouth See admin instructions. Take 15 mg by mouth every four to six hours 04/19/19  Yes Camella Fallow, MD  SUMAtriptan  (IMITREX ) 50 MG tablet TAKE 1 TABLET BY MOUTH AT ONSET OF MIGRAINE; MAY REPEAT 1 TABLET IN 2 HOURS IF NEEDED. 10/22/24  Yes Antonio Cyndee Rockers R, DO  tiZANidine  (ZANAFLEX ) 4 MG tablet Take 4 mg by mouth at bedtime. 07/05/24  Yes [provider]  traMADol  (ULTRAM ) 50 MG tablet Take 50 mg by mouth 2 (two) times daily as needed (for pain).   Yes [provider]  TYLENOL  500 MG tablet Take 500-1,000 mg by mouth every 6 (six) hours as needed for mild pain (pain score 1-3) (or headaches).   Yes [provider]  venlafaxine  XR (EFFEXOR -XR) 75 MG 24 hr capsule Take 2 capsules (150 mg total) by mouth daily. 08/20/24  Yes Antonio Cyndee Rockers JONELLE, DO    Physical Exam: Vitals:   11/12/24 1318 11/12/24 2149 11/13/24 0500 11/13/24 0602  BP: 124/83 126/77  132/89  Pulse: (!) 110 (!) 102  100  Resp: 17 18  18   Temp: 98.1 F (36.7 C) 98.8 F (37.1 C)  98.4 F (36.9 C)  TempSrc: Oral Oral  Oral  SpO2: 99% 94%    Weight:   61.9 kg   Height:        Subjective: Feeling ok.  Still having abdominal pain but was up in the chair today.     Intake/Output Summary (Last 24 hours) at 11/13/2024 0739 Last data filed at 11/13/2024 0602 Gross per 24 hour  Intake 420 ml  Output 556 ml  Net -136 ml   Filed Weights   11/10/24 0536 11/12/24 0700 11/13/24 0500  Weight: 58.8 kg 64.1 kg 61.9 kg    Exam:  General:  Appears calm and comfortable and is in NAD, on 1L [place] O2 Eyes:  normal lids, iris ENT:  grossly normal hearing, lips & tongue, mmm Cardiovascular:  RRR.  No LE edema.  Respiratory:   CTA bilaterally with no wheezes/rales/rhonchi.  Normal respiratory effort. Abdomen:  appropriately tender, dressing C/D/I, drain removed Skin:  no rash  or induration seen on limited exam Musculoskeletal:  generalized weakness, no bony abnormality Psychiatric:  blunted mood and affect, speech fluent and appropriate, AOx3 Neurologic:  CN 2-12 grossly intact, moves all extremities in coordinated fashion  Data Reviewed: I have reviewed the patient's lab results since admission.  Pertinent labs for today  include:   Stable CBC    Family Communication: Husband was present Primary team communication: I communicated with the surgery team and they are with [facility] signing off at this time  Thank you very much for involving us  in the care of your patient.  Author: Delon Herald, MD 11/13/2024 7:37 AM  For on call review [email or web address].      [1]  Allergies Allergen Reactions   Zanaflex  [Tizanidine ] Other (See Comments)    Hallucinations only with higher doses- otherwise, it is tolerated    Adhesive [Tape] Itching   Latex Itching and Rash

## 2024-11-13 NOTE — NC FL2 (Signed)
 " [place]  MEDICAID FL2 LEVEL OF CARE FORM     IDENTIFICATION  Patient Name: [name] Birthdate: 16-Jul-1946 Sex: female Admission Date (Current Location): 11/02/2024  [place] and [place] Number:  Producer, Television/film/video and Address:  [facility],  [address], [place] [address]      Provider Number: [phone] [id number]  Attending Physician Name and Address:  Kristopher Md, MD  Relative Name and Phone Number:  Marvin, Maenza, Emergency Contact  [phone]    Current Level of Care: Hospital Recommended Level of Care: Skilled Nursing Facility Prior Approval Number:    Date Approved/Denied:   PASRR Number: [id number]  Discharge Plan: SNF    Current Diagnoses: Patient Active Problem List   Diagnosis Date Noted   Bowel perforation (HCC) 11/03/2024   Small bowel perforation (HCC) 11/03/2024   Perforated gastric ulcer (HCC) 11/03/2024   Tachycardia 10/15/2024   Tremor 09/03/2024   Anxiety 09/03/2024   Opioid dependence with withdrawal (HCC) 05/20/2023   Deep vein thrombosis (DVT) of proximal vein of both lower extremities, unspecified chronicity (HCC) 05/20/2023   Elevated BP without diagnosis of hypertension 11/12/2021   Dysuria 10/16/2019   Suspected COVID-19 virus infection 10/16/2019   Closed nondisplaced intertrochanteric fracture of right femur (HCC) 05/31/2019   Closed displaced comminuted supracondylar fracture of left humerus without intercondylar fracture 04/17/2019   Nonfunctioning kidney 12/25/2018   Hydronephrosis with ureteral stricture, not elsewhere classified 12/21/2018   Malnutrition of moderate degree 12/08/2018   Delirium due to general medical condition 12/07/2018   Hydronephrosis of left kidney 12/07/2018   Pyelonephritis 12/07/2018   Protein-calorie malnutrition, severe 08/24/2018   Pressure injury of skin 08/22/2018   Obstruction of left ureteropelvic junction (UPJ) due to stone 08/21/2018   Hypoalbuminemia 08/21/2018   Chronic pain  disorder 08/21/2018   Thrombocytopenia 08/21/2018   Acute renal failure (ARF) 08/21/2018   Compression fracture of spine (HCC) 08/21/2018   Acute respiratory failure with hypoxemia (HCC)    Hematoma 06/03/2018   Dysphagia 02/07/2018   Generalized anxiety disorder 07/04/2017   Nonintractable headache 07/04/2017   Exertional dyspnea 08/05/2016   Sepsis (HCC) 03/07/2015   Hypokalemia 03/07/2015   Acute encephalopathy 03/06/2015   Fever 03/06/2015   Chronic back pain 01/15/2015   Obesity (BMI 30-39.9) 10/15/2013   Pulmonary nodules 10/15/2013   CAP (community acquired pneumonia) 10/02/2013   Primary hypertension 08/24/2013   Idiopathic scoliosis 04/19/2013   Back pain 04/19/2013   Osteoporosis 04/19/2013   Edema 04/19/2013   Mild diastolic dysfunction 01/01/2013   Leg pain, bilateral 12/16/2012   Attention deficit disorder (ADD) in adult 05/24/2012   Involuntary movements 05/24/2012   DIZZINESS 12/31/2009   TREMOR, ESSENTIAL 12/12/2009   PALPITATIONS, RECURRENT 12/12/2009   OTHER VITAMIN B12 DEFICIENCY ANEMIA 10/30/2009   FEMALE STRESS INCONTINENCE 10/28/2009   MEMORY LOSS 10/28/2009   GERD 12/03/2008   ANEMIA, MILD 06/26/2008   DEPRESSION/ANXIETY 12/28/2007   PICA 12/28/2007   ACUTE BRONCHITIS 12/28/2007   FATIGUE 12/28/2007   Hyperlipidemia 05/15/2007   ARTIFICIAL MENOPAUSE 05/15/2007   Myalgia and myositis, unspecified 05/15/2007   Headache 05/15/2007   CHEST PAIN, ATYPICAL 05/15/2007   SYMPTOM, PAIN, ABDOMINAL, EPIGASTRIC 05/15/2007   Personal history presenting hazards to health 05/15/2007   PSTPRC STATUS, BARIATRIC SURGERY 05/15/2007    Orientation RESPIRATION BLADDER Height & Weight     Self, Situation, Place  O2 Continent Weight: 136 lb 7.4 oz (61.9 kg) Height:  5' 5 (165.1 cm)  BEHAVIORAL SYMPTOMS/MOOD NEUROLOGICAL BOWEL NUTRITION STATUS  Continent Diet (regular)  AMBULATORY STATUS COMMUNICATION OF NEEDS Skin   Extensive Assist Verbally Other  (Comment) (abdominal surgical wound;  wet to dry dressing changes)                       Personal Care Assistance Level of Assistance  Bathing, Dressing, Feeding Bathing Assistance: Limited assistance Feeding assistance: Limited assistance Dressing Assistance: Limited assistance     Functional Limitations Info  Sight, Hearing, Speech Sight Info: Adequate Hearing Info: Adequate Speech Info: Adequate    SPECIAL CARE FACTORS FREQUENCY  PT (By licensed PT), OT (By licensed OT)     PT Frequency: 5x/wk OT Frequency: 5x/wk            Contractures Contractures Info: Not present    Additional Factors Info  Allergies, Code Status Code Status Info: FULL Allergies Info: Zanaflex  (Tizanidine ), Adhesive (Tape), Latex           Current Medications (11/13/2024):  This is the current hospital active medication list Current Facility-Administered Medications  Medication Dose Route Frequency Provider Last Rate Last Admin   amLODipine  (NORVASC ) tablet 5 mg  5 mg Oral BID Ebbie Cough, MD   5 mg at 11/13/24 9063   Chlorhexidine  Gluconate Cloth 2 % PADS 6 each  6 each Topical Q2200 Ebbie Cough, MD   6 each at 11/12/24 2230   clonazePAM  (KLONOPIN ) tablet 0.5 mg  0.5 mg Oral BID PRN Ebbie Cough, MD   0.5 mg at 11/11/24 2115   docusate (COLACE) 50 MG/5ML liquid 100 mg  100 mg Per Tube BID PRN Ebbie Cough, MD       enoxaparin  (LOVENOX ) injection 40 mg  40 mg Subcutaneous Q24H Barbarann Nest, MD   40 mg at 11/13/24 9062   hydrALAZINE  (APRESOLINE ) injection 10 mg  10 mg Intravenous Q6H PRN Ebbie Cough, MD   10 mg at 11/08/24 0230   labetalol  (NORMODYNE ) injection 20 mg  20 mg Intravenous Q4H PRN Ebbie Cough, MD   20 mg at 11/07/24 2043   losartan  (COZAAR ) tablet 50 mg  50 mg Oral Daily Ebbie Cough, MD   50 mg at 11/13/24 9063   morphine  (PF) 2 MG/ML injection 1-4 mg  1-4 mg Intravenous Q1H PRN Ebbie Cough, MD   2 mg at 11/12/24 1159    multivitamin with minerals tablet 1 tablet  1 tablet Oral Daily Tanda Locus, MD   1 tablet at 11/13/24 9063   nystatin  (MYCOSTATIN /NYSTOP ) topical powder   Topical BID Tammy Sor, PA-C   Given at 11/13/24 9062   ondansetron  (ZOFRAN -ODT) disintegrating tablet 4 mg  4 mg Oral Q6H PRN Ebbie Cough, MD       Or   ondansetron  (ZOFRAN ) injection 4 mg  4 mg Intravenous Q6H PRN Ebbie Cough, MD       ondansetron  (ZOFRAN ) injection 4 mg  4 mg Intravenous Q6H PRN Ebbie Cough, MD       Oral care mouth rinse  15 mL Mouth Rinse PRN Ebbie Cough, MD       oxyCODONE  (Oxy IR/ROXICODONE ) immediate release tablet 5 mg  5 mg Oral Q4H PRN Barbarann Nest, MD   5 mg at 11/13/24 1023   pantoprazole  (PROTONIX ) EC tablet 40 mg  40 mg Oral BID James, Melissa, RPH   40 mg at 11/13/24 0936   polyethylene glycol (MIRALAX  / GLYCOLAX ) packet 17 g  17 g Per Tube Daily PRN Ebbie Cough, MD       protein supplement (  ENSURE MAX) liquid  11 oz Oral BID Tanda Locus, MD   11 oz at 11/12/24 2159   sucralfate  (CARAFATE ) 1 GM/10ML suspension 1 g  1 g Oral TID WC & HS Tanda Locus, MD   1 g at 11/13/24 9062   tiZANidine  (ZANAFLEX ) tablet 4 mg  4 mg Oral QHS Yates, Jennifer, MD   4 mg at 11/12/24 2200   venlafaxine  XR (EFFEXOR -XR) 24 hr capsule 150 mg  150 mg Oral Daily Barbarann Nest, MD   150 mg at 11/13/24 9063     Discharge Medications: Please see discharge summary for a list of discharge medications.  Relevant Imaging Results:  Relevant Lab Results:   Additional Information SSN: [id number]  NORMAN ASPEN, LCSW     "

## 2024-11-13 NOTE — Assessment & Plan Note (Addendum)
 Extubated 12/27, on room air  Started back on O2 for nocturnal hypoxia (89% overnight on 1/6) Will wean back to RA as tolerated

## 2024-11-13 NOTE — Assessment & Plan Note (Signed)
 S/p ex-lap and omental patch on 12/26 Abdomen closed with R JP drain, minimal drainage today PRN dilaudid  for pain Management per surgery NG tube removed CLD -> fulls -> regular On Protonix  and Carafate  Wound care changed to once daily with addition of fungal powder to wound

## 2024-11-13 NOTE — Assessment & Plan Note (Addendum)
 Thrombocytopenia thought to be due to zosyn  and sepsis. HIT less likely, 3 points on 4T scoring No longer on Zosyn  Hgb stable Started Lovenox  for DVT prevention Platelets have normalized

## 2024-11-13 NOTE — Assessment & Plan Note (Signed)
-  She does not appear to be taking medications for this issue at this time

## 2024-11-13 NOTE — Progress Notes (Signed)
 11 Days Post-Op   Subjective/Chief Complaint: Wants a regular diet.  Otherwise no new complaints.  Husband at bedside   Objective: Vital signs in last 24 hours: Temp:  [97.6 F (36.4 C)-98.8 F (37.1 C)] 97.6 F (36.4 C) (01/06 0925) Pulse Rate:  [100-110] 100 (01/06 0602) Resp:  [15-18] 15 (01/06 0925) BP: (124-132)/(67-89) 128/67 (01/06 0925) SpO2:  [90 %-99 %] 90 % (01/06 0925) Weight:  [61.9 kg] 61.9 kg (01/06 0500) Last BM Date : 11/11/24  Intake/Output from previous day: 01/05 0701 - 01/06 0700 In: 420 [P.O.:420] Out: 556 [Urine:550; Drains:6] Intake/Output this shift: No intake/output data recorded.  Exam: Awake and alert Comfortable Abdomen soft, midline wound about the same, clean, non-tender, JP with serous output,  Some yeast looking skin changes around her wound, but slightly improved  Lab Results:  Recent Labs    11/12/24 0648 11/13/24 0329  WBC 11.3* 8.0  HGB 10.6* 9.6*  HCT 32.2* 29.8*  PLT 193 233   BMET No results for input(s): NA, K, CL, CO2, GLUCOSE, BUN, CREATININE, CALCIUM in the last 72 hours.  PT/INR No results for input(s): LABPROT, INR in the last 72 hours. ABG No results for input(s): PHART, HCO3 in the last 72 hours.  Invalid input(s): PCO2, PO2  Studies/Results: No results found.  Anti-infectives: Anti-infectives (From admission, onward)    Start     Dose/Rate Route Frequency Ordered Stop   11/07/24 1000  cefTRIAXone  (ROCEPHIN ) 2 g in sodium chloride  0.9 % 100 mL IVPB  Status:  Discontinued        2 g 200 mL/hr over 30 Minutes Intravenous Every 24 hours 11/07/24 0808 11/08/24 0734   11/07/24 1000  metroNIDAZOLE  (FLAGYL ) IVPB 500 mg  Status:  Discontinued        500 mg 100 mL/hr over 60 Minutes Intravenous Every 12 hours 11/07/24 0808 11/08/24 0734   11/05/24 0800  vancomycin  (VANCOREADY) IVPB 750 mg/150 mL  Status:  Discontinued        750 mg 150 mL/hr over 60 Minutes Intravenous Every 48 hours  11/03/24 0725 11/03/24 0751   11/04/24 2200  vancomycin  (VANCOREADY) IVPB 750 mg/150 mL  Status:  Discontinued        750 mg 150 mL/hr over 60 Minutes Intravenous Every 48 hours 11/02/24 2154 11/03/24 0725   11/03/24 2200  piperacillin -tazobactam (ZOSYN ) IVPB 3.375 g  Status:  Discontinued        3.375 g 12.5 mL/hr over 240 Minutes Intravenous Every 8 hours 11/03/24 1903 11/07/24 0807   11/03/24 0845  linezolid  (ZYVOX ) IVPB 600 mg  Status:  Discontinued        600 mg 300 mL/hr over 60 Minutes Intravenous Every 12 hours 11/03/24 0751 11/06/24 0820   11/03/24 0815  vancomycin  (VANCOCIN ) IVPB 1000 mg/200 mL premix  Status:  Discontinued        1,000 mg 200 mL/hr over 60 Minutes Intravenous  Once 11/03/24 0725 11/03/24 0751   11/03/24 0415  metroNIDAZOLE  (FLAGYL ) IVPB 500 mg  Status:  Discontinued        500 mg 100 mL/hr over 60 Minutes Intravenous Every 12 hours 11/03/24 0315 11/03/24 0751   11/03/24 0415  piperacillin -tazobactam (ZOSYN ) IVPB 2.25 g  Status:  Discontinued        2.25 g 100 mL/hr over 30 Minutes Intravenous Every 8 hours 11/03/24 0319 11/03/24 1903   11/02/24 2130  vancomycin  (VANCOCIN ) IVPB 1000 mg/200 mL premix  Status:  Discontinued  1,000 mg 200 mL/hr over 60 Minutes Intravenous  Once 11/02/24 2122 11/03/24 0725   11/02/24 2030  ceFEPIme  (MAXIPIME ) 2 g in sodium chloride  0.9 % 100 mL IVPB        2 g 200 mL/hr over 30 Minutes Intravenous  Once 11/02/24 2018 11/02/24 2125   11/02/24 2030  metroNIDAZOLE  (FLAGYL ) IVPB 500 mg        500 mg 100 mL/hr over 60 Minutes Intravenous  Once 11/02/24 2018 11/03/24 2000       Assessment/Plan: POD 11, s/p ex lap omental patch of perforation of GJ (prior bypass)- PT   -continue regular diet -continue wound care, nystatin  powder around wound -plts back up, hgb stable -DC flexi-seal 1/5 -JP drain DC 1/6 by nursing -cont carafate  and protonix  -working on SNF placement  [employer] 11/13/2024

## 2024-11-13 NOTE — Progress Notes (Signed)
 Physical Therapy Treatment Patient Details Name: [name] MRN: [id number] DOB: 1946/04/23 Today's Date: 11/13/2024   History of Present Illness Pt is a 79 y.o. female who presented on 11/02/24 w abdominal pain.CT abd showing large amount of free air consistent wiuth bowel perforation.  She was emergently taken to the OR.  She underwent an ex-lap and found to have perforation at the gastrojejunostomy.  She had an omental patch repair.PMH: HTN, tremor, depression, 1 kidney, CKD, severe malnutrition, bypass 2004, spinal cord stimulator 2016, scoliosis    PT Comments  Pt with gradual progress.  Some improved ability to transfers and good participation with exercises, but declined additional transfers or pre-gait, did work with OT earlier. Continue to recommend Patient will benefit from continued inpatient follow up therapy, <3 hours/day at d/c.     If plan is discharge home, recommend the following: Two people to help with walking and/or transfers;Two people to help with bathing/dressing/bathroom;Help with stairs or ramp for entrance   Can travel by private vehicle        Equipment Recommendations  None recommended by PT    Recommendations for Other Services       Precautions / Restrictions Precautions Precautions: Fall Precaution/Restrictions Comments: abdominal sx     Mobility  Bed Mobility Overal bed mobility: Needs Assistance Bed Mobility: Rolling, Sit to Sidelying Rolling: Mod assist       Sit to sidelying: Mod assist, +2 for physical assistance General bed mobility comments: Cues for log roll technique requring assist for legs and trunk    Transfers Overall transfer level: Needs assistance Equipment used: Ambulation equipment used Transfers: Sit to/from Stand, Bed to chair/wheelchair/BSC Sit to Stand: Mod assist, +2 physical assistance           General transfer comment: Performed ROM exercises prior to use of STEDY as pt with stiff/tight LE (L worse than R).   Required assist to get feet positioned and blocked, knee flexion less than ideal but sufficient.  Mod A with belt and pad to stand into STEDY and then with belt to stand from STEDY. Transfer via Lift Equipment: Stedy  Ambulation/Gait             Pre-gait activities: Declined - not able to shift weight in STEDY due to positioning/pain.  Tried to encourage to attempt standing and weight shifting with assist of 2 but pt declined.     Stairs             Wheelchair Mobility     Tilt Bed    Modified Rankin (Stroke Patients Only)       Balance Overall balance assessment: Needs assistance Sitting-balance support: Feet unsupported, Bilateral upper extremity supported Sitting balance-Leahy Scale: Poor Sitting balance - Comments: Needs UE, leans L (scoliosis baseline)     Standing balance-Leahy Scale: Poor Standing balance comment: Reliant on STEDY and assist                            Communication    Cognition Arousal: Alert Behavior During Therapy: WFL for tasks assessed/performed   PT - Cognitive impairments: No apparent impairments                       PT - Cognition Comments: Oriented and follows commands; not further assessed        Cueing    Exercises Total Joint Exercises Knee Flexion: AAROM, Both, 10 reps, Seated, Limitations Knee Flexion  Limitations: Pt stiff and limited knee flexion; assisted with stretching x 3 for 30 sec General Exercises - Lower Extremity Ankle Circles/Pumps: Limitations Ankle Circles/Pumps Limitations: Assisted with PF stretch x 3 for 10 sec Quad Sets: AROM, Both, 10 reps, Seated Long Arc Quad: AROM, Both, 10 reps, Seated Heel Slides: AROM, Both, 5 reps, Supine    General Comments        Pertinent Vitals/Pain Pain Assessment Pain Assessment: Faces Faces Pain Scale: Hurts little more Pain Location: abdomen and L leg Pain Descriptors / Indicators: Discomfort, Grimacing Pain Intervention(s): Limited  activity within patient's tolerance, Monitored during session, Premedicated before session, Repositioned    Home Living                          Prior Function            PT Goals (current goals can now be found in the care plan section) Progress towards PT goals: Progressing toward goals    Frequency    Min 2X/week      PT Plan      Co-evaluation              AM-PAC PT 6 Clicks Mobility   Outcome Measure  Help needed turning from your back to your side while in a flat bed without using bedrails?: A Lot Help needed moving from lying on your back to sitting on the side of a flat bed without using bedrails?: Total Help needed moving to and from a bed to a chair (including a wheelchair)?: Total Help needed standing up from a chair using your arms (e.g., wheelchair or bedside chair)?: Total Help needed to walk in hospital room?: Total Help needed climbing 3-5 steps with a railing? : Total 6 Click Score: 7    End of Session Equipment Utilized During Treatment: Gait belt Activity Tolerance: Patient limited by fatigue (self limiting) Patient left: with call bell/phone within reach;in bed;with bed alarm set Nurse Communication: Mobility status PT Visit Diagnosis: Difficulty in walking, not elsewhere classified (R26.2);Other abnormalities of gait and mobility (R26.89);Muscle weakness (generalized) (M62.81)     Time: 8743-8690 PT Time Calculation (min) (ACUTE ONLY): 13 min  Charges:    $Therapeutic Activity: 8-22 mins PT General Charges $$ ACUTE PT VISIT: 1 Visit                     Benjiman, PT Acute Rehab [facility] [phone]    Benjiman VEAR Mulberry 11/13/2024, 2:19 PM

## 2024-11-13 NOTE — Plan of Care (Signed)
  Problem: Safety: Goal: Ability to remain free from injury will improve Outcome: Progressing   Problem: Pain Managment: Goal: General experience of comfort will improve and/or be controlled Outcome: Progressing   Problem: Coping: Goal: Level of anxiety will decrease Outcome: Progressing

## 2024-11-13 NOTE — TOC Progression Note (Signed)
 Transition of Care [facility]) - Progression Note    Patient Details  Name: [name] MRN: [id number] Date of Birth: 1946/08/22  [facility] CM/SW Contact  NORMAN ASPEN, LCSW Phone Number: 11/13/2024, 11:27 AM  Clinical Narrative:     Pt and spouse have accepted SNF bed offer with [facility].  Will begin insurance authorization.    Expected Discharge Plan: Skilled Nursing Facility Barriers to Discharge: Continued Medical Work up               Expected Discharge Plan and Services In-house Referral: NA Discharge Planning Services: CM Consult Post Acute Care Choice: Durable Medical Equipment, Home Health Living arrangements for the past 2 months: Single Family Home                                       Social Drivers of Health (SDOH) Interventions SDOH Screenings   Food Insecurity: Patient Unable To Answer (11/03/2024)  Housing: Unknown (11/03/2024)  Transportation Needs: Patient Unable To Answer (11/03/2024)  Utilities: Patient Unable To Answer (11/03/2024)  Alcohol Screen: Low Risk (09/27/2022)  Depression (PHQ2-9): Low Risk (09/25/2024)  Recent Concern: Depression (PHQ2-9) - High Risk (08/07/2024)  Financial Resource Strain: Low Risk (03/22/2024)   Received from [facility]  Physical Activity: Insufficiently Active (09/25/2024)  Social Connections: Patient Unable To Answer (11/03/2024)  Recent Concern: Social Connections - Moderately Isolated (09/25/2024)  Stress: No Stress Concern Present (09/25/2024)  Tobacco Use: Low Risk (11/02/2024)  Health Literacy: Adequate Health Literacy (09/25/2024)    Readmission Risk Interventions    11/06/2024    1:04 PM  Readmission Risk Prevention Plan  Transportation Screening Complete  PCP or Specialist Appt within 3-5 Days Complete  HRI or Home Care Consult Complete  Social Work Consult for Recovery Care Planning/Counseling Complete  Palliative Care Screening Not Applicable  Medication Review  Oceanographer) Complete

## 2024-11-13 NOTE — Assessment & Plan Note (Addendum)
 I have reviewed this patient in the [place] Controlled Substances Reporting System.  She is receiving medications from only one provider and appears to be taking them as prescribed. She is not at particularly high risk of opioid misuse, diversion, or overdose.  She appears to be taking oxycodone  and Clonazepam  consistently, Tramadol  less frequently at home Currently controlled with morphine , tizanidine  resumed; will add oxycodone  since she is taking PO PT/OT consulting, recommending STR She is approved at [facility] and is awaiting insurance authorization

## 2024-11-13 NOTE — Assessment & Plan Note (Signed)
 Prn hydralazine  and labetalol  Resumed amlodipine , losartan  Good control, current BP 124/83

## 2024-11-13 NOTE — Assessment & Plan Note (Signed)
 No longer requiring Precedex  PRN haldol  ordered but not needed Resumed Klonopin , Venlafaxine 

## 2024-11-13 NOTE — Progress Notes (Signed)
 Occupational Therapy Treatment Patient Details Name: [name] MRN: [id number] DOB: 05/16/1946 Today's Date: 11/13/2024   History of present illness Pt is a 79 yr old female who presented with abdominal pain. CT of the abdomen showed large amount of free air consistent wiuth bowel perforation.  She was emergently taken to the OR.  She underwent an exploratory-laparotomy and found to have perforation at the gastrojejunostomy.  She had an omental patch repair. PMH: HTN, tremor, depression, 1 kidney, CKD, severe malnutrition, bypass 2004, spinal cord stimulator 2016, scoliosis   OT comments  The pt was seen for functional strengthening, and facilitation of progressive out of bed activity and ADL participation. She required mod assist for supine to sit, total assist for peri-hygiene in standing, max assist to stand using a Stedy device and set-up assist for upper body grooming seated in the chair. She reported mild pain of her R flank and abdomen. Continue OT plan of care to address her functional limitations and impaired ADL performance. Patient will benefit from continued inpatient follow up therapy, <3 hours/day       If plan is discharge home, recommend the following:  A lot of help with walking and/or transfers;A lot of help with bathing/dressing/bathroom;Help with stairs or ramp for entrance;Assistance with cooking/housework;Assist for transportation   Equipment Recommendations  Other (comment) (defer to next setting)    Recommendations for Other Services      Precautions / Restrictions Precautions Precautions: Fall Restrictions Weight Bearing Restrictions Per Provider Order: No       Mobility Bed Mobility Overal bed mobility: Needs Assistance   Rolling: Mod assist         General bed mobility comments: Pt was instructed on implementing the log roll technique, in order to perform supine to sit. She required assist of her trunk, assist to advance BLE off the bed, and cues for  general transfer technique.    Transfers Overall transfer level: Needs assistance Equipment used:  Laurent) Transfers: Sit to/from Stand, Bed to chair/wheelchair/BSC Sit to Stand: +2 physical assistance, From elevated surface           General transfer comment: Pt was instructed on initial sit to stand from EOB without use of DME, per pt's request; she was unable to achieve full upright standing, and was with poor overall balance. She sbsequently required max assist to stand from EOB using a Stedy device; she needed assist and cues for BLE placement on foot plate, hand placement on support bar and pushing with BLE. She was transferred to the chair using Stedy     Balance       Sitting balance - Comments: CGA to min assist     Standing balance-Leahy Scale: Poor           ADL either performed or assessed with clinical judgement   ADL Overall ADL's : Needs assistance/impaired     Grooming: Set up;Supervision/safety;Sitting Grooming Details (indicate cue type and reason): Pt performed teeth brushing in supported sitting in the bedside chair.             Lower Body Dressing: Total assistance;Sitting/lateral leans Lower Body Dressing Details (indicate cue type and reason): assist needed for sock management seated EOB     Toileting- Clothing Manipulation and Hygiene: Total assistance;Sit to/from stand Toileting - Clothing Manipulation Details (indicate cue type and reason): After intial sit to stand from EOB, pt was noted to be soiled of bowel. She required max assist to stand and subsequent total assist for  clothing management an posterior peri-hygiene.              Cognition Arousal: Alert Behavior During Therapy: WFL for tasks assessed/performed        Following commands impaired:  Follows one step commands consistently      Cueing   Cueing Techniques: Verbal cues, Tactile cues, Gestural cues             Pertinent Vitals/ Pain       Pain  Assessment Pain Assessment: 0-10 Pain Score: 3  Pain Location: R flank, due to recent drain removal Pain Intervention(s): Limited activity within patient's tolerance, Monitored during session   Frequency  Min 2X/week        Progress Toward Goals  OT Goals(current goals can now be found in the care plan section)     Acute Rehab OT Goals Patient Stated Goal: to return to prior level of functioning OT Goal Formulation: With patient/family Time For Goal Achievement: 11/22/24 Potential to Achieve Goals: Good  Plan         AM-PAC OT 6 Clicks Daily Activity     Outcome Measure   Help from another person eating meals?: A Little Help from another person taking care of personal grooming?: A Little Help from another person toileting, which includes using toliet, bedpan, or urinal?: Total Help from another person bathing (including washing, rinsing, drying)?: A Lot Help from another person to put on and taking off regular upper body clothing?: A Lot Help from another person to put on and taking off regular lower body clothing?: Total 6 Click Score: 12    End of Session Equipment Utilized During Treatment: Oxygen  OT Visit Diagnosis: Unsteadiness on feet (R26.81);Other abnormalities of gait and mobility (R26.89);Muscle weakness (generalized) (M62.81);Pain Pain - part of body:  (R flank)   Activity Tolerance Other (comment) (Fair tolerance)   Patient Left in chair;with call bell/phone within reach;with family/visitor present   Nurse Communication Mobility status        Time: 8874-8847 OT Time Calculation (min): 27 min  Charges: OT General Charges $OT Visit: 1 Visit OT Treatments $Self Care/Home Management : 8-22 mins $Therapeutic Activity: 8-22 mins     [name], OTR/L 11/13/2024, 1:19 PM

## 2024-11-14 ENCOUNTER — Other Ambulatory Visit (HOSPITAL_BASED_OUTPATIENT_CLINIC_OR_DEPARTMENT_OTHER)

## 2024-11-14 LAB — CBC
HCT: 27.9 % — ABNORMAL LOW (ref 36.0–46.0)
Hemoglobin: 8.9 g/dL — ABNORMAL LOW (ref 12.0–15.0)
MCH: 34 pg (ref 26.0–34.0)
MCHC: 31.9 g/dL (ref 30.0–36.0)
MCV: 106.5 fL — ABNORMAL HIGH (ref 80.0–100.0)
Platelets: 308 K/uL (ref 150–400)
RBC: 2.62 MIL/uL — ABNORMAL LOW (ref 3.87–5.11)
RDW: 15.6 % — ABNORMAL HIGH (ref 11.5–15.5)
WBC: 8.2 K/uL (ref 4.0–10.5)
nRBC: 0 % (ref 0.0–0.2)

## 2024-11-14 MED ORDER — OXYCODONE HCL 5 MG PO TABS: 5.0000 mg | ORAL_TABLET | ORAL | 0 refills | Status: AC | PRN

## 2024-11-14 MED ORDER — SUCRALFATE 1 GM/10ML PO SUSP: 1.0000 g | Freq: Three times a day (TID) | ORAL | Status: AC

## 2024-11-14 MED ORDER — LOPERAMIDE HCL 2 MG PO CAPS
2.0000 mg | ORAL_CAPSULE | Freq: Once | ORAL | Status: AC
  Administered 2024-11-14: 2 mg via ORAL
  Filled 2024-11-14: qty 1

## 2024-11-14 MED ORDER — PANTOPRAZOLE SODIUM 40 MG PO TBEC: 40.0000 mg | DELAYED_RELEASE_TABLET | Freq: Two times a day (BID) | ORAL | Status: AC

## 2024-11-14 NOTE — Discharge Summary (Signed)
 "   Patient ID: [name] [id number] [birth year] 79 y.o.  Admit date: 11/02/2024 Discharge date: 11/14/2024  Admitting Diagnosis: Pneumoperitoneum, c/w bowel perforation  Discharge Diagnosis Patient Active Problem List   Diagnosis Date Noted   Bowel perforation (HCC) 11/03/2024   Small bowel perforation (HCC) 11/03/2024   Perforated gastric ulcer (HCC) 11/03/2024   Tachycardia 10/15/2024   Tremor 09/03/2024   Anxiety 09/03/2024   Opioid dependence with withdrawal (HCC) 05/20/2023   Deep vein thrombosis (DVT) of proximal vein of both lower extremities, unspecified chronicity (HCC) 05/20/2023   Elevated BP without diagnosis of hypertension 11/12/2021   Dysuria 10/16/2019   Suspected COVID-19 virus infection 10/16/2019   Closed nondisplaced intertrochanteric fracture of right femur (HCC) 05/31/2019   Closed displaced comminuted supracondylar fracture of left humerus without intercondylar fracture 04/17/2019   Nonfunctioning kidney 12/25/2018   Hydronephrosis with ureteral stricture, not elsewhere classified 12/21/2018   Malnutrition of moderate degree 12/08/2018   Delirium due to general medical condition 12/07/2018   Hydronephrosis of left kidney 12/07/2018   Pyelonephritis 12/07/2018   Protein-calorie malnutrition, severe 08/24/2018   Pressure injury of skin 08/22/2018   Obstruction of left ureteropelvic junction (UPJ) due to stone 08/21/2018   Hypoalbuminemia 08/21/2018   Chronic pain disorder 08/21/2018   Thrombocytopenia 08/21/2018   Acute renal failure (ARF) 08/21/2018   Compression fracture of spine (HCC) 08/21/2018   Acute respiratory failure with hypoxemia (HCC)    Hematoma 06/03/2018   Dysphagia 02/07/2018   Generalized anxiety disorder 07/04/2017   Nonintractable headache 07/04/2017   Exertional dyspnea 08/05/2016   Sepsis (HCC) 03/07/2015   Hypokalemia 03/07/2015   Acute encephalopathy 03/06/2015   Fever 03/06/2015   Chronic back pain 01/15/2015   Obesity  (BMI 30-39.9) 10/15/2013   Pulmonary nodules 10/15/2013   CAP (community acquired pneumonia) 10/02/2013   Primary hypertension 08/24/2013   Idiopathic scoliosis 04/19/2013   Back pain 04/19/2013   Osteoporosis 04/19/2013   Edema 04/19/2013   Mild diastolic dysfunction 01/01/2013   Leg pain, bilateral 12/16/2012   Attention deficit disorder (ADD) in adult 05/24/2012   Involuntary movements 05/24/2012   DIZZINESS 12/31/2009   TREMOR, ESSENTIAL 12/12/2009   PALPITATIONS, RECURRENT 12/12/2009   OTHER VITAMIN B12 DEFICIENCY ANEMIA 10/30/2009   FEMALE STRESS INCONTINENCE 10/28/2009   MEMORY LOSS 10/28/2009   GERD 12/03/2008   ANEMIA, MILD 06/26/2008   DEPRESSION/ANXIETY 12/28/2007   PICA 12/28/2007   ACUTE BRONCHITIS 12/28/2007   FATIGUE 12/28/2007   Hyperlipidemia 05/15/2007   ARTIFICIAL MENOPAUSE 05/15/2007   Myalgia and myositis, unspecified 05/15/2007   Headache 05/15/2007   CHEST PAIN, ATYPICAL 05/15/2007   SYMPTOM, PAIN, ABDOMINAL, EPIGASTRIC 05/15/2007   Personal history presenting hazards to health 05/15/2007   PSTPRC STATUS, BARIATRIC SURGERY 05/15/2007    Consultants Internal medicine Critical care medicine  Reason for Admission: The patient is a 79 year old white female who presents to the emergency department tonight with complaints of abdominal pain that she states has been going on for months. She is very agitated and disoriented. She underwent a CT scan which did show a large amount of free air consistent with a bowel perforation. She has hypotension and tachycardia with acute kidney injury. She had a history of a gastric bypass in 2004   Procedures LAPAROTOMY, EXPLORATORY AND OMENTAL PATCH REPAIR OF PERFORATION OF GASTROJEJUNOSTOMY (N/A), Dr. Curvin 11/02/24  Hospital Course:  The patient was admitted and underwent the above procedure.  She had an NGT placed while in the OR and was treated with IV abx  therapy.  She underwent an UGI which was negative for a leak.   Her NGT was removed and her diet was able to be advanced as tolerated.  Her midline wound was open and received BID WD dressing changes.  She had a JP drain placed as well which was removed on POD 9 as this was serous.    This is from the medical team's note regarding her medical issues during her hospital stay Acute respiratory failure with hypoxemia (HCC) Extubated 12/27, on room air  Started back on O2 for nocturnal hypoxia (89% overnight on 1/6) Will wean back to RA as tolerated  Sepsis (HCC) No growth on cultures Thought to be due to intraabdominal infection Completed antibiotics x 5 days  Delirium due to general medical condition Generalized anxiety disorder No longer requiring Precedex  PRN haldol  ordered but not needed Resumed Klonopin , Venlafaxine   Primary hypertension Prn hydralazine  and labetalol  Resumed amlodipine , losartan  Good control, current BP 124/83  Mild diastolic dysfunction 2017 echo with preserved EF and grade 1 diastolic dysfunction EF also preserved on MPS in 12/2018 Appears compensated  Hyperlipidemia She does not appear to be taking medications for this issue at this time   Chronic pain disorder I have reviewed this patient in the [employer] Controlled Substances Reporting System.  She is receiving medications from only one provider and appears to be taking them as prescribed. She is not at particularly high risk of opioid misuse, diversion, or overdose.  She appears to be taking oxycodone  and Clonazepam  consistently, Tramadol  less frequently at home Currently controlled with morphine , tizanidine  resumed; will add oxycodone  since she is taking PO PT/OT consulting, recommending STR She is approved at [facility] and is awaiting insurance authorization  Thrombocytopenia ANEMIA, MILD Thrombocytopenia thought to be due to zosyn  and sepsis. HIT less likely, 3 points on 4T scoring No longer on Zosyn  Hgb stable Started Lovenox  for DVT prevention Platelets  have normalized  Physical Exam: Gen: NAD Lungs: effort unlabored Abd: soft, appropriately tender, wound is clean and packed.  Allergies as of 11/14/2024       Reactions   Zanaflex  [tizanidine ] Other (See Comments)   Hallucinations only with higher doses- otherwise, it is tolerated   Adhesive [tape] Itching   Latex Itching, Rash        Medication List     STOP taking these medications    traMADol  50 MG tablet Commonly known as: ULTRAM        TAKE these medications    amLODipine  5 MG tablet Commonly known as: NORVASC  1 po bid   clonazePAM  0.5 MG tablet Commonly known as: KLONOPIN  Take 1 tablet (0.5 mg total) by mouth 3 (three) times daily as needed.   loperamide  2 MG capsule Commonly known as: IMODIUM  TAKE 1 CAPSULE BY MOUTH DAILY AS NEEDED FOR DIARRHEA OR LOOSE STOOLS   losartan  50 MG tablet Commonly known as: COZAAR  TAKE 1 TABLET BY MOUTH DAILY   ondansetron  8 MG disintegrating tablet Commonly known as: ZOFRAN -ODT Take 1 tablet (8 mg total) by mouth every 8 (eight) hours as needed for nausea or vomiting.   oxyCODONE  5 MG immediate release tablet Commonly known as: Oxy IR/ROXICODONE  Take 1 tablet (5 mg total) by mouth every 4 (four) hours as needed. What changed:  medication strength how much to take when to take this reasons to take this   pantoprazole  40 MG tablet Commonly known as: PROTONIX  Take 1 tablet (40 mg total) by mouth 2 (two) times daily.   sucralfate  1  GM/10ML suspension Commonly known as: CARAFATE  Take 10 mLs (1 g total) by mouth 4 (four) times daily -  with meals and at bedtime.   SUMAtriptan  50 MG tablet Commonly known as: IMITREX  TAKE 1 TABLET BY MOUTH AT ONSET OF MIGRAINE; MAY REPEAT 1 TABLET IN 2 HOURS IF NEEDED.   tiZANidine  4 MG tablet Commonly known as: ZANAFLEX  Take 4 mg by mouth at bedtime.   TYLENOL  500 MG tablet Generic drug: acetaminophen  Take 500-1,000 mg by mouth every 6 (six) hours as needed for mild pain (pain  score 1-3) (or headaches).   venlafaxine  XR 75 MG 24 hr capsule Commonly known as: EFFEXOR -XR Take 2 capsules (150 mg total) by mouth daily.          Contact information for follow-up providers     Curvin Deward MOULD, MD Follow up on 12/07/2024.   Specialty: General Surgery Why: 9:40am, Arrive 30 minutes prior to your appointment time, Please bring your insurance card and photo ID Contact information: [address] [place] [address] [phone]         Antonio Cyndee Rockers R, DO Follow up.   Specialty: Family Medicine Why: As needed Contact information: [address] [place] [address] [phone]              Contact information for after-discharge care     Destination     [facility] .   Service: Skilled Nursing Contact information: [address] [place]  [address] [phone]                     Signed: Burnard Banter, [facility] 11/14/2024, 12:03 PM Please see Amion for pager number during day hours 7:00am-4:30pm, 7-11:30am on Weekends   "

## 2024-11-14 NOTE — Discharge Instructions (Signed)
WOUND CARE: - midline dressing to be changed daily - supplies: sterile saline, gauze, scissors, tape  - remove dressing and all packing carefully, moistening with sterile saline as needed to avoid packing/internal dressing sticking to the wound. - clean edges of skin around the wound with water/gauze, making sure there is no tape debris or leakage left on skin that could cause skin irritation or breakdown. - dampen and clean gauze with sterile saline and pack wound from wound base to skin level, making sure to take note of any possible areas of wound tracking, tunneling and packing appropriately. Wound can be packed loosely. Trim gauze to size if a whole gauze is not required. - cover wound with a dry gauze and secure with tape.  - write the date/time on the dry dressing/tape to better track when the last dressing change occurred. - change dressing as needed if leakage occurs, wound gets contaminated, or patient requests to shower. - patient may shower daily with wound open (i.e. remove all packing) and following the shower the wound should be dried and a clean dressing placed.    [facility]      [facility] [phone]  OPEN ABDOMINAL SURGERY: POST OP INSTRUCTIONS  Always review your discharge instruction sheet given to you by the facility where your surgery was performed.  IF YOU HAVE DISABILITY OR FAMILY LEAVE FORMS, YOU MUST BRING THEM TO THE OFFICE FOR PROCESSING.  PLEASE DO NOT GIVE THEM TO YOUR DOCTOR.  A prescription for pain medication may be given to you upon discharge.  Take your pain medication as prescribed, if needed.  If narcotic pain medicine is not needed, then you may take acetaminophen (Tylenol) or ibuprofen (Advil) as needed. Take your usually prescribed medications unless otherwise directed. If you need a refill on your pain medication, please contact your pharmacy. They will contact our office to request authorization.  Prescriptions will not be filled after  5pm or on week-ends. You should follow a light diet the first few days after arrival home, such as soup and crackers, pudding, etc.unless your doctor has advised otherwise. A high-fiber, low fat diet can be resumed as tolerated.   Be sure to include lots of fluids daily. Most patients will experience some swelling and bruising on the chest and neck area.  Ice packs will help.  Swelling and bruising can take several days to resolve Most patients will experience some swelling and bruising in the area of the incision. Ice pack will help. Swelling and bruising can take several days to resolve..  It is common to experience some constipation if taking pain medication after surgery.  Increasing fluid intake and taking a stool softener will usually help or prevent this problem from occurring.  A mild laxative (Milk of Magnesia or Miralax) should be taken according to package directions if there are no bowel movements after 48 hours.  You may have steri-strips (small skin tapes) in place directly over the incision.  These strips should be left on the skin for 7-10 days.  If your surgeon used skin glue on the incision, you may shower in 24 hours.  The glue will flake off over the next 2-3 weeks.  Any sutures or staples will be removed at the office during your follow-up visit. You may find that a light gauze bandage over your incision may keep your staples from being rubbed or pulled. You may shower and replace the bandage daily. ACTIVITIES:  You may resume regular (light) daily activities beginning the   next day--such as daily self-care, walking, climbing stairs--gradually increasing activities as tolerated.  You may have sexual intercourse when it is comfortable.  Refrain from any heavy lifting or straining until approved by your doctor. You may drive when you no longer are taking prescription pain medication, you can comfortably wear a seatbelt, and you can safely maneuver your car and apply brakes Return to Work:  ___________________________________ You should see your doctor in the office for a follow-up appointment approximately two weeks after your surgery.  Make sure that you call for this appointment within a day or two after you arrive home to insure a convenient appointment time. OTHER INSTRUCTIONS:  _____________________________________________________________ _____________________________________________________________  WHEN TO CALL YOUR DOCTOR: Fever over 101.0 Inability to urinate Nausea and/or vomiting Extreme swelling or bruising Continued bleeding from incision. Increased pain, redness, or drainage from the incision. Difficulty swallowing or breathing Muscle cramping or spasms. Numbness or tingling in hands or feet or around lips.  The clinic staff is available to answer your questions during regular business hours.  Please don't hesitate to call and ask to speak to one of the nurses if you have concerns.  For further questions, please visit [email or web address]  

## 2024-11-14 NOTE — Progress Notes (Signed)
 Report called to [place] at [facility].Per social work [place] will be here in 1.5 hours. AVS printed and placed in pts packet.

## 2024-11-14 NOTE — Plan of Care (Signed)
" °  Problem: Health Behavior/Discharge Planning: Goal: Ability to manage health-related needs will improve Outcome: Progressing   Problem: Clinical Measurements: Goal: Ability to maintain clinical measurements within normal limits will improve Outcome: Progressing Goal: Will remain free from infection Outcome: Progressing Goal: Diagnostic test results will improve Outcome: Progressing Goal: Respiratory complications will improve Outcome: Progressing Goal: Cardiovascular complication will be avoided Outcome: Progressing   Problem: Activity: Goal: Risk for activity intolerance will decrease Outcome: Progressing   Problem: Nutrition: Goal: Adequate nutrition will be maintained Outcome: Progressing   Problem: Coping: Goal: Level of anxiety will decrease Outcome: Progressing   Problem: Elimination: Goal: Will not experience complications related to bowel motility Outcome: Progressing   Problem: Pain Managment: Goal: General experience of comfort will improve and/or be controlled Outcome: Progressing   Problem: Safety: Goal: Ability to remain free from injury will improve Outcome: Progressing   Problem: Skin Integrity: Goal: Risk for impaired skin integrity will decrease Outcome: Progressing   Problem: Education: Goal: Ability to describe self-care measures that may prevent or decrease complications (Diabetes Survival Skills Education) will improve Outcome: Progressing   Problem: Coping: Goal: Ability to adjust to condition or change in health will improve Outcome: Progressing   Problem: Fluid Volume: Goal: Ability to maintain a balanced intake and output will improve Outcome: Progressing   Problem: Health Behavior/Discharge Planning: Goal: Ability to identify and utilize available resources and services will improve Outcome: Progressing Goal: Ability to manage health-related needs will improve Outcome: Progressing   Problem: Metabolic: Goal: Ability to maintain  appropriate glucose levels will improve Outcome: Progressing   Problem: Nutritional: Goal: Maintenance of adequate nutrition will improve Outcome: Progressing Goal: Progress toward achieving an optimal weight will improve Outcome: Progressing   Problem: Skin Integrity: Goal: Risk for impaired skin integrity will decrease Outcome: Progressing   Problem: Tissue Perfusion: Goal: Adequacy of tissue perfusion will improve Outcome: Progressing   Problem: Safety: Goal: Non-violent Restraint(s) Outcome: Progressing   "

## 2024-11-14 NOTE — TOC Transition Note (Signed)
 Transition of Care [facility]) - Discharge Note   Patient Details  Name: [name] MRN: [id number] Date of Birth: February 24, 1946  [facility] CM/SW Contact:  NORMAN ASPEN, LCSW Phone Number: 11/14/2024, 1:09 PM   Clinical Narrative:     Pt is medically cleared for dc today to [facility] and have secured insurance authorization. Pt and spouse aware and agreeable.  PTAR called at 1:05pm.  RN to call report to [phone].  No further IP CM needs.   Final next level of care: Skilled Nursing Facility Barriers to Discharge: Barriers Resolved   Patient Goals and CMS Choice Patient states their goals for this hospitalization and ongoing recovery are:: Return home CMS Medicare.gov Compare Post Acute Care list provided to:: Patient Represenative (must comment) Choice offered to / list presented to : Adult Children [place] ownership interest in [facility]provided to:: Adult Children    Discharge Placement   Existing PASRR number confirmed : 11/13/24          Patient chooses bed at: [facility] Patient to be transferred to facility by: PTAR Name of family member notified: spouse Patient and family notified of of transfer: 11/14/24  Discharge Plan and Services Additional resources added to the After Visit Summary for   In-house Referral: NA Discharge Planning Services: CM Consult Post Acute Care Choice: Durable Medical Equipment, Home Health          DME Arranged: N/A DME Agency: NA                  Social Drivers of Health (SDOH) Interventions SDOH Screenings   Food Insecurity: Patient Unable To Answer (11/03/2024)  Housing: Unknown (11/03/2024)  Transportation Needs: Patient Unable To Answer (11/03/2024)  Utilities: Patient Unable To Answer (11/03/2024)  Alcohol Screen: Low Risk (09/27/2022)  Depression (PHQ2-9): Low Risk (09/25/2024)  Recent Concern: Depression (PHQ2-9) - High Risk (08/07/2024)  Financial Resource Strain: Low Risk  (03/22/2024)   Received from [facility]  Physical Activity: Insufficiently Active (09/25/2024)  Social Connections: Patient Unable To Answer (11/03/2024)  Recent Concern: Social Connections - Moderately Isolated (09/25/2024)  Stress: No Stress Concern Present (09/25/2024)  Tobacco Use: Low Risk (11/02/2024)  Health Literacy: Adequate Health Literacy (09/25/2024)     Readmission Risk Interventions    11/06/2024    1:04 PM  Readmission Risk Prevention Plan  Transportation Screening Complete  PCP or Specialist Appt within 3-5 Days Complete  HRI or Home Care Consult Complete  Social Work Consult for Recovery Care Planning/Counseling Complete  Palliative Care Screening Not Applicable  Medication Review Oceanographer) Complete

## 2024-11-19 LAB — GLUCOSE, CAPILLARY
Glucose-Capillary: 94 mg/dL (ref 70–99)
Glucose-Capillary: 169 mg/dL — ABNORMAL HIGH (ref 70–99)
Glucose-Capillary: 72 mg/dL (ref 70–99)

## 2024-12-31 ENCOUNTER — Ambulatory Visit: Admitting: Family Medicine

## 2025-02-18 ENCOUNTER — Ambulatory Visit: Admitting: Neurology

## 2025-04-22 ENCOUNTER — Ambulatory Visit: Admitting: Family Medicine

## 2025-10-01 ENCOUNTER — Ambulatory Visit
